# Patient Record
Sex: Female | Born: 1960 | Race: White | Hispanic: No | Marital: Married | Smoking: Current every day smoker
Health system: Southern US, Community
[De-identification: ages and names within clinical notes are randomized; demographics above are authoritative.]

## PROBLEM LIST (undated history)

## (undated) ENCOUNTER — Ambulatory Visit: Payer: Self-pay | Admitting: Gastroenterology

## (undated) DIAGNOSIS — B182 Chronic viral hepatitis C: Secondary | ICD-10-CM

## (undated) DIAGNOSIS — F1010 Alcohol abuse, uncomplicated: Secondary | ICD-10-CM

## (undated) DIAGNOSIS — N200 Calculus of kidney: Secondary | ICD-10-CM

## (undated) DIAGNOSIS — G2581 Restless legs syndrome: Secondary | ICD-10-CM

## (undated) DIAGNOSIS — K5790 Diverticulosis of intestine, part unspecified, without perforation or abscess without bleeding: Secondary | ICD-10-CM

## (undated) DIAGNOSIS — R0602 Shortness of breath: Secondary | ICD-10-CM

## (undated) DIAGNOSIS — F419 Anxiety disorder, unspecified: Secondary | ICD-10-CM

## (undated) DIAGNOSIS — N2889 Other specified disorders of kidney and ureter: Secondary | ICD-10-CM

## (undated) DIAGNOSIS — K219 Gastro-esophageal reflux disease without esophagitis: Secondary | ICD-10-CM

## (undated) DIAGNOSIS — J439 Emphysema, unspecified: Secondary | ICD-10-CM

## (undated) DIAGNOSIS — C3490 Malignant neoplasm of unspecified part of unspecified bronchus or lung: Secondary | ICD-10-CM

## (undated) DIAGNOSIS — G629 Polyneuropathy, unspecified: Secondary | ICD-10-CM

## (undated) DIAGNOSIS — M130 Polyarthritis, unspecified: Secondary | ICD-10-CM

## (undated) DIAGNOSIS — E119 Type 2 diabetes mellitus without complications: Secondary | ICD-10-CM

## (undated) DIAGNOSIS — E559 Vitamin D deficiency, unspecified: Secondary | ICD-10-CM

## (undated) DIAGNOSIS — K449 Diaphragmatic hernia without obstruction or gangrene: Secondary | ICD-10-CM

## (undated) DIAGNOSIS — G894 Chronic pain syndrome: Secondary | ICD-10-CM

## (undated) DIAGNOSIS — K7030 Alcoholic cirrhosis of liver without ascites: Secondary | ICD-10-CM

## (undated) DIAGNOSIS — M797 Fibromyalgia: Secondary | ICD-10-CM

## (undated) DIAGNOSIS — F329 Major depressive disorder, single episode, unspecified: Secondary | ICD-10-CM

## (undated) DIAGNOSIS — Z972 Presence of dental prosthetic device (complete) (partial): Secondary | ICD-10-CM

## (undated) DIAGNOSIS — K08109 Complete loss of teeth, unspecified cause, unspecified class: Secondary | ICD-10-CM

## (undated) DIAGNOSIS — Z8719 Personal history of other diseases of the digestive system: Secondary | ICD-10-CM

## (undated) DIAGNOSIS — G8929 Other chronic pain: Secondary | ICD-10-CM

## (undated) DIAGNOSIS — D500 Iron deficiency anemia secondary to blood loss (chronic): Secondary | ICD-10-CM

## (undated) DIAGNOSIS — R350 Frequency of micturition: Secondary | ICD-10-CM

## (undated) DIAGNOSIS — M5136 Other intervertebral disc degeneration, lumbar region: Secondary | ICD-10-CM

## (undated) DIAGNOSIS — M479 Spondylosis, unspecified: Secondary | ICD-10-CM

## (undated) DIAGNOSIS — M47816 Spondylosis without myelopathy or radiculopathy, lumbar region: Secondary | ICD-10-CM

## (undated) DIAGNOSIS — M545 Low back pain: Secondary | ICD-10-CM

## (undated) DIAGNOSIS — I10 Essential (primary) hypertension: Secondary | ICD-10-CM

## (undated) HISTORY — DX: Vitamin D deficiency, unspecified: E55.9

## (undated) HISTORY — DX: Restless legs syndrome: G25.81

## (undated) HISTORY — DX: Frequency of micturition: R35.0

## (undated) HISTORY — DX: Polyarthritis, unspecified: M13.0

## (undated) HISTORY — PX: TONSILLECTOMY: SUR1361

## (undated) HISTORY — DX: Low back pain: M54.5

## (undated) HISTORY — DX: Other chronic pain: G89.29

## (undated) HISTORY — DX: Essential (primary) hypertension: I10

## (undated) HISTORY — DX: Other intervertebral disc degeneration, lumbar region: M51.36

## (undated) HISTORY — DX: Anxiety disorder, unspecified: F41.9

## (undated) HISTORY — DX: Shortness of breath: R06.02

## (undated) HISTORY — DX: Chronic viral hepatitis C: B18.2

## (undated) HISTORY — DX: Fibromyalgia: M79.7

## (undated) HISTORY — DX: Alcohol abuse, uncomplicated: F10.10

## (undated) HISTORY — PX: TUBAL LIGATION: SHX77

---

## 2001-08-22 ENCOUNTER — Other Ambulatory Visit: Admission: RE | Admit: 2001-08-22 | Discharge: 2001-08-22 | Payer: Self-pay | Admitting: *Deleted

## 2001-12-28 ENCOUNTER — Encounter: Payer: Self-pay | Admitting: Internal Medicine

## 2001-12-29 ENCOUNTER — Ambulatory Visit (HOSPITAL_COMMUNITY): Admission: RE | Admit: 2001-12-29 | Discharge: 2001-12-29 | Payer: Self-pay | Admitting: Internal Medicine

## 2005-03-29 HISTORY — PX: ENDOMETRIAL ABLATION: SHX621

## 2005-06-24 ENCOUNTER — Ambulatory Visit: Payer: Self-pay | Admitting: Unknown Physician Specialty

## 2007-05-24 ENCOUNTER — Ambulatory Visit: Payer: Self-pay | Admitting: Internal Medicine

## 2007-06-06 ENCOUNTER — Ambulatory Visit: Payer: Self-pay | Admitting: Internal Medicine

## 2008-07-31 ENCOUNTER — Ambulatory Visit: Payer: Self-pay | Admitting: Unknown Physician Specialty

## 2008-08-07 ENCOUNTER — Ambulatory Visit: Payer: Self-pay | Admitting: Unknown Physician Specialty

## 2008-08-07 ENCOUNTER — Ambulatory Visit: Payer: Self-pay | Admitting: Internal Medicine

## 2009-02-05 ENCOUNTER — Ambulatory Visit: Payer: Self-pay | Admitting: Unknown Physician Specialty

## 2009-09-17 ENCOUNTER — Ambulatory Visit: Payer: Self-pay | Admitting: Unknown Physician Specialty

## 2009-11-01 ENCOUNTER — Ambulatory Visit: Payer: Self-pay | Admitting: Family Medicine

## 2009-11-01 DIAGNOSIS — J209 Acute bronchitis, unspecified: Secondary | ICD-10-CM | POA: Insufficient documentation

## 2010-04-28 NOTE — Assessment & Plan Note (Signed)
Summary: congestion with bad cough-headache/jbb   Vital Signs:  Patient Profile:   50 Years Old Female CC:      Cold & URI symptoms Height:     70 inches Weight:      167 pounds BMI:     24.05 O2 Sat:      98 % O2 treatment:    Room Air Temp:     97.6 degrees F oral Pulse rate:   77 / minute Pulse rhythm:   regular Resp:     20 per minute BP sitting:   144 / 91  (right arm)  Pt. in pain?   yes    Location:   head    Intensity:   5    Type:       aching  Vitals Entered By: Levonne Spiller EMT-P (November 01, 2009 11:21 AM)              Is Patient Diabetic? No Comments Pt is a smoker. 1 half pack per day.      Prior Medication List:  No prior medications documented  Current Allergies: No known allergies History of Present Illness Chief Complaint: Cold & URI symptoms History of Present Illness:  Patient got sick on Tuesday . Her Last day of vacation. She has had acough and head congestion.  Usually some allergies at season change but never coughing spells that make her feel thatr her breath is beiongtaken a way. No wheezing but nose is running heavily.   REVIEW OF SYSTEMS Constitutional Symptoms       Complains of fever, chills, night sweats, and fatigue.     Denies weight loss and weight gain.  Eyes       Denies change in vision, eye pain, and eye discharge. Ear/Nose/Throat/Mouth       Complains of ear pain, frequent runny nose, sinus problems, sore throat, and hoarseness.      Denies hearing loss/aids, change in hearing, ear discharge, and frequent nose bleeds.      Comments: from coughing Respiratory       Complains of dry cough and shortness of breath.      Denies productive cough, wheezing, asthma, bronchitis, and emphysema/COPD.  Cardiovascular       Complains of tires easily with exhertion.      Denies murmurs and chest pain.    Gastrointestinal       Denies stomach pain, nausea/vomiting, diarrhea, constipation, and blood in bowel movements. Genitourniary  Denies painful urination and blood or discharge from vagina. Neurological       Complains of headaches and weakness.      Denies seizures. Musculoskeletal       Complains of muscle pain.      Denies joint pain, joint stiffness, and decreased range of motion.   Psych       Complains of sleep problems.      Denies anxiety/stress and speech problems.  Past History:  Past Surgical History: uterine ablation  Family History: Family History of Stroke F 1st degree relative <60 Family History of Stroke M 1st degree relative <50 Family History Breast cancer 1st degree relative <50  Social History: Married Current Smoker Alcohol use-no Drug use-no Regular exercise-no Smoking Status:  current Drug Use:  no Does Patient Exercise:  no Physical Exam General appearance: well developed, well nourished,mild distress Head: normocephalic, atraumatic Ears: R ear w/scarring  Nasal: pale, boggy, swollen nasal turbinates Neck: neck supple,  trachea midline, no masses Chest/Lungs: no rales, wheezes,  or rhonchi bilateral, breath sounds equal without effort Skin: no obvious rashes or lesions MSE: oriented to time, place, and person Assessment New Problems: BRONCHITIS, ACUTE (ICD-466.0) ACUTE SINUSITIS, UNSPECIFIED (ICD-461.9) FAMILY HISTORY BREAST CANCER 1ST DEGREE RELATIVE <50 (ICD-V16.3)  sinusitis bronchitis  Patient Education: Patient and/or caregiver instructed in the following: rest, rest fluids and Tylenol.  Plan New Medications/Changes: Sandria Senter ER 8-10 MG/5ML LQCR (CHLORPHENIRAMINE-HYDROCODONE) 1 tsp by mouth 2x aday  #4 floz x 0, 11/01/2009, Hassan Rowan MD CEFUROXIME AXETIL 500 MG  TABS (CEFUROXIME AXETIL) 1 by mouth 2 times daily  #20 x 0, 11/01/2009, Hassan Rowan MD  New Orders: New Patient Level III [phone] Follow Up: Follow up in 2-3 days if no improvement  The patient and/or caregiver has been counseled thoroughly with regard to medications prescribed  including dosage, schedule, interactions, rationale for use, and possible side effects and they verbalize understanding.  Diagnoses and expected course of recovery discussed and will return if not improved as expected or if the condition worsens. Patient and/or caregiver verbalized understanding.  Prescriptions: Sandria Senter ER 8-10 MG/5ML LQCR (CHLORPHENIRAMINE-HYDROCODONE) 1 tsp by mouth 2x aday  #4 floz x 0   Entered and Authorized by:   Hassan Rowan MD   Signed by:   Hassan Rowan MD on 11/01/2009   Method used:   Print then Give to Patient   RxID:   [id number] CEFUROXIME AXETIL 500 MG  TABS (CEFUROXIME AXETIL) 1 by mouth 2 times daily  #20 x 0   Entered and Authorized by:   Hassan Rowan MD   Signed by:   Hassan Rowan MD on 11/01/2009   Method used:   Print then Give to Patient   RxID:   [id number]   Patient Instructions: 1)  Please schedule a follow-up appointment as needed. 2)  Please schedule an appointment with your primary doctor in 1-2 weeks if not bettter. 3)  Tobacco is very bad for your health and your loved ones! You Should stop smoking!. 4)  Stop Smoking Tips: Choose a Quit date. Cut down before the Quit date. decide what you will do as a substitute when you feel the urge to smoke(gum,toothpick,exercise). 5)  Tussinex can cause sedation be careful taking during the day 6)  Take your antibiotic as prescribed until ALL of it is gone, but stop if you develop a rash or swelling and contact our office as soon as possible. 7)  Acute sinusitis symptoms for less than 10 days are not helped by antibiotics.Use warm moist compresses, and over the counter decongestants ( only as directed). Call if no improvement in 5-7 days, sooner if increasing pain, fever, or new symptoms.  Orders Added: 1)  New Patient Level III [36644]

## 2010-11-19 ENCOUNTER — Ambulatory Visit: Payer: Self-pay | Admitting: Obstetrics & Gynecology

## 2010-12-25 ENCOUNTER — Ambulatory Visit: Payer: Self-pay | Admitting: Surgery

## 2011-02-22 ENCOUNTER — Ambulatory Visit: Payer: Self-pay | Admitting: Gastroenterology

## 2011-02-24 LAB — PATHOLOGY REPORT

## 2011-12-14 ENCOUNTER — Ambulatory Visit: Payer: Self-pay | Admitting: Internal Medicine

## 2011-12-19 HISTORY — PX: SPINAL FUSION: SHX223

## 2012-12-17 ENCOUNTER — Emergency Department: Payer: Self-pay | Admitting: Emergency Medicine

## 2012-12-17 LAB — DRUG SCREEN, URINE
Amphetamines, Ur Screen: NEGATIVE (ref ?–1000)
Barbiturates, Ur Screen: NEGATIVE (ref ?–200)
Benzodiazepine, Ur Scrn: NEGATIVE (ref ?–200)
Cannabinoid 50 Ng, Ur ~~LOC~~: NEGATIVE (ref ?–50)
Cocaine Metabolite,Ur ~~LOC~~: NEGATIVE (ref ?–300)
MDMA (Ecstasy)Ur Screen: NEGATIVE (ref ?–500)
Methadone, Ur Screen: NEGATIVE (ref ?–300)
Opiate, Ur Screen: POSITIVE (ref ?–300)
Phencyclidine (PCP) Ur S: NEGATIVE (ref ?–25)
Tricyclic, Ur Screen: NEGATIVE (ref ?–1000)

## 2012-12-17 LAB — URINALYSIS, COMPLETE
Bilirubin,UR: NEGATIVE
Blood: NEGATIVE
Glucose,UR: NEGATIVE mg/dL (ref 0–75)
Hyaline Cast: 17
Ketone: NEGATIVE
Leukocyte Esterase: NEGATIVE
Nitrite: NEGATIVE
Ph: 5 (ref 4.5–8.0)
Protein: NEGATIVE
RBC,UR: <1 /HPF (ref 0–5)
Specific Gravity: 1.005 (ref 1.003–1.030)
Squamous Epithelial: 1
WBC UR: 4 /HPF (ref 0–5)

## 2012-12-17 LAB — COMPREHENSIVE METABOLIC PANEL
Albumin: 3.9 g/dL (ref 3.4–5.0)
Alkaline Phosphatase: 107 U/L (ref 50–136)
Anion Gap: 8 (ref 7–16)
BUN: 6 mg/dL — ABNORMAL LOW (ref 7–18)
Bilirubin,Total: 0.4 mg/dL (ref 0.2–1.0)
Calcium, Total: 8.8 mg/dL (ref 8.5–10.1)
Chloride: 110 mmol/L — ABNORMAL HIGH (ref 98–107)
Co2: 22 mmol/L (ref 21–32)
Creatinine: 1.15 mg/dL (ref 0.60–1.30)
EGFR (African American): >60
EGFR (Non-African Amer.): 55 — ABNORMAL LOW
Glucose: 169 mg/dL — ABNORMAL HIGH (ref 65–99)
Osmolality: 281 (ref 275–301)
Potassium: 3.7 mmol/L (ref 3.5–5.1)
SGOT(AST): 185 U/L — ABNORMAL HIGH (ref 15–37)
SGPT (ALT): 151 U/L — ABNORMAL HIGH (ref 12–78)
Sodium: 140 mmol/L (ref 136–145)
Total Protein: 6.7 g/dL (ref 6.4–8.2)

## 2012-12-17 LAB — CBC
HCT: 39.1 % (ref 35.0–47.0)
HGB: 13.2 g/dL (ref 12.0–16.0)
MCH: 36 pg — ABNORMAL HIGH (ref 26.0–34.0)
MCHC: 33.6 g/dL (ref 32.0–36.0)
MCV: 107 fL — ABNORMAL HIGH (ref 80–100)
Platelet: 178 10*3/uL (ref 150–440)
RBC: 3.65 10*6/uL — ABNORMAL LOW (ref 3.80–5.20)
RDW: 12.9 % (ref 11.5–14.5)
WBC: 8.6 10*3/uL (ref 3.6–11.0)

## 2012-12-17 LAB — TROPONIN I: Troponin-I: <0.02 ng/mL

## 2012-12-17 LAB — SALICYLATE LEVEL: Salicylates, Serum: 2.4 mg/dL

## 2012-12-17 LAB — ETHANOL
Ethanol %: 0.28 % — ABNORMAL HIGH (ref 0.000–0.080)
Ethanol: 280 mg/dL

## 2012-12-17 LAB — CK
CK, Total: 1339 U/L — ABNORMAL HIGH (ref 21–215)
CK, Total: 1597 U/L — ABNORMAL HIGH (ref 21–215)

## 2012-12-17 LAB — TSH: Thyroid Stimulating Horm: 0.686 u[IU]/mL

## 2012-12-17 LAB — ACETAMINOPHEN LEVEL: Acetaminophen: <2 ug/mL

## 2013-09-23 ENCOUNTER — Emergency Department: Payer: Self-pay | Admitting: Emergency Medicine

## 2013-09-23 LAB — COMPREHENSIVE METABOLIC PANEL
Albumin: 2.9 g/dL — ABNORMAL LOW (ref 3.4–5.0)
Alkaline Phosphatase: 81 U/L
Anion Gap: 7 (ref 7–16)
BUN: 5 mg/dL — ABNORMAL LOW (ref 7–18)
Bilirubin,Total: 1.1 mg/dL — ABNORMAL HIGH (ref 0.2–1.0)
Calcium, Total: 8.5 mg/dL (ref 8.5–10.1)
Chloride: 97 mmol/L — ABNORMAL LOW (ref 98–107)
Co2: 30 mmol/L (ref 21–32)
Creatinine: 0.73 mg/dL (ref 0.60–1.30)
EGFR (African American): >60
EGFR (Non-African Amer.): >60
Glucose: 152 mg/dL — ABNORMAL HIGH (ref 65–99)
Osmolality: 268 (ref 275–301)
Potassium: 3.4 mmol/L — ABNORMAL LOW (ref 3.5–5.1)
SGOT(AST): 81 U/L — ABNORMAL HIGH (ref 15–37)
SGPT (ALT): 33 U/L (ref 12–78)
Sodium: 134 mmol/L — ABNORMAL LOW (ref 136–145)
Total Protein: 6.9 g/dL (ref 6.4–8.2)

## 2013-09-23 LAB — CBC WITH DIFFERENTIAL/PLATELET
Basophil #: 0 10*3/uL (ref 0.0–0.1)
Basophil %: 0.8 %
Eosinophil #: 0.1 10*3/uL (ref 0.0–0.7)
Eosinophil %: 1.6 %
HCT: 34.2 % — ABNORMAL LOW (ref 35.0–47.0)
HGB: 11.6 g/dL — ABNORMAL LOW (ref 12.0–16.0)
Lymphocyte #: 1.8 10*3/uL (ref 1.0–3.6)
Lymphocyte %: 31.2 %
MCH: 38.1 pg — ABNORMAL HIGH (ref 26.0–34.0)
MCHC: 34 g/dL (ref 32.0–36.0)
MCV: 112 fL — ABNORMAL HIGH (ref 80–100)
Monocyte #: 0.5 x10 3/mm (ref 0.2–0.9)
Monocyte %: 9.2 %
Neutrophil #: 3.4 10*3/uL (ref 1.4–6.5)
Neutrophil %: 57.2 %
Platelet: 96 10*3/uL — ABNORMAL LOW (ref 150–440)
RBC: 3.05 10*6/uL — ABNORMAL LOW (ref 3.80–5.20)
RDW: 13.4 % (ref 11.5–14.5)
WBC: 5.9 10*3/uL (ref 3.6–11.0)

## 2013-09-23 LAB — URINALYSIS, COMPLETE
Bacteria: NONE SEEN
Bilirubin,UR: NEGATIVE
Blood: NEGATIVE
Glucose,UR: NEGATIVE mg/dL (ref 0–75)
Ketone: NEGATIVE
Leukocyte Esterase: NEGATIVE
Nitrite: NEGATIVE
Ph: 8 (ref 4.5–8.0)
Protein: NEGATIVE
RBC,UR: NONE SEEN /HPF (ref 0–5)
Specific Gravity: 1.006 (ref 1.003–1.030)
Squamous Epithelial: 4
WBC UR: 2 /HPF (ref 0–5)

## 2013-09-23 LAB — PRO B NATRIURETIC PEPTIDE: B-Type Natriuretic Peptide: 929 pg/mL — ABNORMAL HIGH (ref 0–125)

## 2013-09-23 LAB — TROPONIN I: Troponin-I: <0.02 ng/mL

## 2013-10-12 ENCOUNTER — Ambulatory Visit: Payer: Self-pay | Admitting: Internal Medicine

## 2013-10-12 LAB — COMPREHENSIVE METABOLIC PANEL
Albumin: 3 g/dL — ABNORMAL LOW (ref 3.4–5.0)
Alkaline Phosphatase: 86 U/L
Anion Gap: 7 (ref 7–16)
BUN: 3 mg/dL — ABNORMAL LOW (ref 7–18)
Bilirubin,Total: 1.1 mg/dL — ABNORMAL HIGH (ref 0.2–1.0)
Calcium, Total: 8.1 mg/dL — ABNORMAL LOW (ref 8.5–10.1)
Chloride: 103 mmol/L (ref 98–107)
Co2: 30 mmol/L (ref 21–32)
Creatinine: 0.9 mg/dL (ref 0.60–1.30)
EGFR (African American): >60
EGFR (Non-African Amer.): >60
Glucose: 129 mg/dL — ABNORMAL HIGH (ref 65–99)
Osmolality: 278 (ref 275–301)
Potassium: 3.2 mmol/L — ABNORMAL LOW (ref 3.5–5.1)
SGOT(AST): 71 U/L — ABNORMAL HIGH (ref 15–37)
SGPT (ALT): 31 U/L (ref 12–78)
Sodium: 140 mmol/L (ref 136–145)
Total Protein: 7.2 g/dL (ref 6.4–8.2)

## 2013-10-12 LAB — CBC WITH DIFFERENTIAL/PLATELET
Basophil #: 0.1 10*3/uL (ref 0.0–0.1)
Basophil %: 1 %
Eosinophil #: 0.1 10*3/uL (ref 0.0–0.7)
Eosinophil %: 1.2 %
HCT: 36.2 % (ref 35.0–47.0)
HGB: 12.2 g/dL (ref 12.0–16.0)
Lymphocyte #: 1.8 10*3/uL (ref 1.0–3.6)
Lymphocyte %: 33.5 %
MCH: 37 pg — ABNORMAL HIGH (ref 26.0–34.0)
MCHC: 33.6 g/dL (ref 32.0–36.0)
MCV: 110 fL — ABNORMAL HIGH (ref 80–100)
Monocyte #: 0.5 x10 3/mm (ref 0.2–0.9)
Monocyte %: 9.5 %
Neutrophil #: 2.9 10*3/uL (ref 1.4–6.5)
Neutrophil %: 54.8 %
Platelet: 93 10*3/uL — ABNORMAL LOW (ref 150–440)
RBC: 3.29 10*6/uL — ABNORMAL LOW (ref 3.80–5.20)
RDW: 13.3 % (ref 11.5–14.5)
WBC: 5.3 10*3/uL (ref 3.6–11.0)

## 2013-10-12 LAB — LIPID PANEL
Cholesterol: 113 mg/dL (ref 0–200)
HDL Cholesterol: 7 mg/dL — ABNORMAL LOW (ref 40–60)
Ldl Cholesterol, Calc: 68 mg/dL (ref 0–100)
Triglycerides: 190 mg/dL (ref 0–200)
VLDL Cholesterol, Calc: 38 mg/dL (ref 5–40)

## 2013-10-12 LAB — T4, FREE: Free Thyroxine: 1.16 ng/dL (ref 0.76–1.46)

## 2013-10-12 LAB — IRON AND TIBC
Iron Bind.Cap.(Total): 198 ug/dL — ABNORMAL LOW (ref 250–450)
Iron Saturation: 95 %
Iron: 188 ug/dL — ABNORMAL HIGH (ref 50–170)
Unbound Iron-Bind.Cap.: 10 ug/dL

## 2013-10-12 LAB — FERRITIN: Ferritin (ARMC): 834 ng/mL — ABNORMAL HIGH (ref 8–388)

## 2013-10-12 LAB — SEDIMENTATION RATE: Erythrocyte Sed Rate: 37 mm/hr — ABNORMAL HIGH (ref 0–30)

## 2013-10-12 LAB — URIC ACID: Uric Acid: 8.1 mg/dL — ABNORMAL HIGH (ref 2.6–6.0)

## 2013-10-12 LAB — TSH: Thyroid Stimulating Horm: 1.72 u[IU]/mL

## 2013-10-12 LAB — FOLATE: Folic Acid: 7.9 ng/mL (ref 3.1–100.0)

## 2013-10-19 ENCOUNTER — Other Ambulatory Visit: Payer: Self-pay | Admitting: Internal Medicine

## 2013-10-27 ENCOUNTER — Ambulatory Visit: Payer: Self-pay | Admitting: Internal Medicine

## 2013-11-21 ENCOUNTER — Other Ambulatory Visit: Payer: Self-pay | Admitting: Orthopaedic Surgery

## 2013-11-21 DIAGNOSIS — IMO0002 Reserved for concepts with insufficient information to code with codable children: Secondary | ICD-10-CM

## 2013-11-21 DIAGNOSIS — M545 Low back pain: Secondary | ICD-10-CM

## 2013-11-22 LAB — BASIC METABOLIC PANEL
Anion Gap: 11 (ref 7–16)
BUN: 4 mg/dL — ABNORMAL LOW (ref 7–18)
Calcium, Total: 8.2 mg/dL — ABNORMAL LOW (ref 8.5–10.1)
Chloride: 100 mmol/L (ref 98–107)
Co2: 30 mmol/L (ref 21–32)
Creatinine: 0.92 mg/dL (ref 0.60–1.30)
EGFR (African American): >60
EGFR (Non-African Amer.): >60
Glucose: 104 mg/dL — ABNORMAL HIGH (ref 65–99)
Osmolality: 278 (ref 275–301)
Potassium: 2.9 mmol/L — ABNORMAL LOW (ref 3.5–5.1)
Sodium: 141 mmol/L (ref 136–145)

## 2013-11-22 LAB — CBC WITH DIFFERENTIAL/PLATELET
Basophil #: 0.1 10*3/uL (ref 0.0–0.1)
Basophil #: 0 10*3/uL (ref 0.0–0.1)
Basophil %: 0.6 %
Basophil %: 0.5 %
Eosinophil #: 0.2 10*3/uL (ref 0.0–0.7)
Eosinophil #: 0.2 10*3/uL (ref 0.0–0.7)
Eosinophil %: 2.1 %
Eosinophil %: 2.4 %
HCT: 38 % (ref 35.0–47.0)
HCT: 37 % (ref 35.0–47.0)
HGB: 12.4 g/dL (ref 12.0–16.0)
HGB: 12.2 g/dL (ref 12.0–16.0)
Lymphocyte #: 4.4 10*3/uL — ABNORMAL HIGH (ref 1.0–3.6)
Lymphocyte #: 3.5 10*3/uL (ref 1.0–3.6)
Lymphocyte %: 50.3 %
Lymphocyte %: 51 %
MCH: 36 pg — ABNORMAL HIGH (ref 26.0–34.0)
MCH: 36.3 pg — ABNORMAL HIGH (ref 26.0–34.0)
MCHC: 32.5 g/dL (ref 32.0–36.0)
MCHC: 32.9 g/dL (ref 32.0–36.0)
MCV: 111 fL — ABNORMAL HIGH (ref 80–100)
MCV: 111 fL — ABNORMAL HIGH (ref 80–100)
Monocyte #: 0.7 x10 3/mm (ref 0.2–0.9)
Monocyte #: 0.6 x10 3/mm (ref 0.2–0.9)
Monocyte %: 7.8 %
Monocyte %: 8.8 %
Neutrophil #: 3.4 10*3/uL (ref 1.4–6.5)
Neutrophil #: 2.6 10*3/uL (ref 1.4–6.5)
Neutrophil %: 39.2 %
Neutrophil %: 37.3 %
Platelet: 106 10*3/uL — ABNORMAL LOW (ref 150–440)
Platelet: 93 10*3/uL — ABNORMAL LOW (ref 150–440)
RBC: 3.43 10*6/uL — ABNORMAL LOW (ref 3.80–5.20)
RBC: 3.35 10*6/uL — ABNORMAL LOW (ref 3.80–5.20)
RDW: 14.7 % — ABNORMAL HIGH (ref 11.5–14.5)
RDW: 14.8 % — ABNORMAL HIGH (ref 11.5–14.5)
WBC: 8.7 10*3/uL (ref 3.6–11.0)
WBC: 7 10*3/uL (ref 3.6–11.0)

## 2013-11-22 LAB — HEPATIC FUNCTION PANEL A (ARMC)
Albumin: 3.3 g/dL — ABNORMAL LOW (ref 3.4–5.0)
Alkaline Phosphatase: 99 U/L
Bilirubin, Direct: 1.2 mg/dL — ABNORMAL HIGH (ref 0.00–0.20)
Bilirubin,Total: 2.3 mg/dL — ABNORMAL HIGH (ref 0.2–1.0)
SGOT(AST): 96 U/L — ABNORMAL HIGH (ref 15–37)
SGPT (ALT): 50 U/L
Total Protein: 7.9 g/dL (ref 6.4–8.2)

## 2013-11-22 LAB — ETHANOL: Ethanol: <3 mg/dL

## 2013-11-22 LAB — URINALYSIS, COMPLETE
Bacteria: NONE SEEN
Glucose,UR: NEGATIVE mg/dL (ref 0–75)
Hyaline Cast: 9
Ketone: NEGATIVE
Leukocyte Esterase: NEGATIVE
Nitrite: NEGATIVE
Ph: 5 (ref 4.5–8.0)
Protein: 100
RBC,UR: NONE SEEN /HPF (ref 0–5)
Specific Gravity: 1.028 (ref 1.003–1.030)
Squamous Epithelial: 35
WBC UR: 6 /HPF (ref 0–5)

## 2013-11-22 LAB — PROTIME-INR
INR: 1.6
Prothrombin Time: 18.4 secs — ABNORMAL HIGH (ref 11.5–14.7)

## 2013-11-23 ENCOUNTER — Inpatient Hospital Stay: Payer: Self-pay | Admitting: Internal Medicine

## 2013-11-23 LAB — CBC WITH DIFFERENTIAL/PLATELET
Basophil #: 0 10*3/uL (ref 0.0–0.1)
Basophil %: 0.6 %
Eosinophil #: 0.2 10*3/uL (ref 0.0–0.7)
Eosinophil %: 6.1 %
HCT: 28.3 % — ABNORMAL LOW (ref 35.0–47.0)
HGB: 9.6 g/dL — ABNORMAL LOW (ref 12.0–16.0)
Lymphocyte #: 2.1 10*3/uL (ref 1.0–3.6)
Lymphocyte %: 58.5 %
MCH: 37.2 pg — ABNORMAL HIGH (ref 26.0–34.0)
MCHC: 33.9 g/dL (ref 32.0–36.0)
MCV: 110 fL — ABNORMAL HIGH (ref 80–100)
Monocyte #: 0.3 x10 3/mm (ref 0.2–0.9)
Monocyte %: 9.3 %
Neutrophil #: 0.9 10*3/uL — ABNORMAL LOW (ref 1.4–6.5)
Neutrophil %: 25.5 %
Platelet: 66 10*3/uL — ABNORMAL LOW (ref 150–440)
RBC: 2.58 10*6/uL — ABNORMAL LOW (ref 3.80–5.20)
RDW: 14.5 % (ref 11.5–14.5)
WBC: 3.6 10*3/uL (ref 3.6–11.0)

## 2013-11-23 LAB — BASIC METABOLIC PANEL
Anion Gap: 6 — ABNORMAL LOW (ref 7–16)
BUN: 5 mg/dL — ABNORMAL LOW (ref 7–18)
Calcium, Total: 7.1 mg/dL — ABNORMAL LOW (ref 8.5–10.1)
Chloride: 102 mmol/L (ref 98–107)
Co2: 30 mmol/L (ref 21–32)
Creatinine: 0.83 mg/dL (ref 0.60–1.30)
EGFR (African American): >60
EGFR (Non-African Amer.): >60
Glucose: 139 mg/dL — ABNORMAL HIGH (ref 65–99)
Osmolality: 275 (ref 275–301)
Potassium: 2.7 mmol/L — ABNORMAL LOW (ref 3.5–5.1)
Sodium: 138 mmol/L (ref 136–145)

## 2013-11-23 LAB — PHOSPHORUS: Phosphorus: 3.6 mg/dL (ref 2.5–4.9)

## 2013-11-23 LAB — MAGNESIUM: Magnesium: 0.8 mg/dL — ABNORMAL LOW

## 2013-11-24 LAB — CBC WITH DIFFERENTIAL/PLATELET
Basophil #: 0 10*3/uL (ref 0.0–0.1)
Basophil %: 0.8 %
Eosinophil #: 0.1 10*3/uL (ref 0.0–0.7)
Eosinophil %: 4 %
HCT: 30.5 % — ABNORMAL LOW (ref 35.0–47.0)
HGB: 9.9 g/dL — ABNORMAL LOW (ref 12.0–16.0)
Lymphocyte #: 1.4 10*3/uL (ref 1.0–3.6)
Lymphocyte %: 43 %
MCH: 36.6 pg — ABNORMAL HIGH (ref 26.0–34.0)
MCHC: 32.4 g/dL (ref 32.0–36.0)
MCV: 113 fL — ABNORMAL HIGH (ref 80–100)
Monocyte #: 0.4 x10 3/mm (ref 0.2–0.9)
Monocyte %: 10.9 %
Neutrophil #: 1.3 10*3/uL — ABNORMAL LOW (ref 1.4–6.5)
Neutrophil %: 41.3 %
Platelet: 67 10*3/uL — ABNORMAL LOW (ref 150–440)
RBC: 2.7 10*6/uL — ABNORMAL LOW (ref 3.80–5.20)
RDW: 14.4 % (ref 11.5–14.5)
WBC: 3.2 10*3/uL — ABNORMAL LOW (ref 3.6–11.0)

## 2013-11-24 LAB — URINE CULTURE

## 2013-11-24 LAB — BASIC METABOLIC PANEL
Anion Gap: 8 (ref 7–16)
BUN: 3 mg/dL — ABNORMAL LOW (ref 7–18)
Calcium, Total: 7.1 mg/dL — ABNORMAL LOW (ref 8.5–10.1)
Chloride: 110 mmol/L — ABNORMAL HIGH (ref 98–107)
Co2: 23 mmol/L (ref 21–32)
Creatinine: 0.77 mg/dL (ref 0.60–1.30)
EGFR (African American): >60
EGFR (Non-African Amer.): >60
Glucose: 172 mg/dL — ABNORMAL HIGH (ref 65–99)
Osmolality: 282 (ref 275–301)
Potassium: 4.2 mmol/L (ref 3.5–5.1)
Sodium: 141 mmol/L (ref 136–145)

## 2013-11-24 LAB — MAGNESIUM: Magnesium: 1.3 mg/dL — ABNORMAL LOW

## 2013-11-25 LAB — MAGNESIUM: Magnesium: 1.5 mg/dL — ABNORMAL LOW

## 2013-11-26 LAB — CBC WITH DIFFERENTIAL/PLATELET
Basophil #: 0 10*3/uL (ref 0.0–0.1)
Basophil %: 1.1 %
Eosinophil #: 0.1 10*3/uL (ref 0.0–0.7)
Eosinophil %: 3.4 %
HCT: 31.6 % — ABNORMAL LOW (ref 35.0–47.0)
HGB: 10.3 g/dL — ABNORMAL LOW (ref 12.0–16.0)
Lymphocyte #: 1.8 10*3/uL (ref 1.0–3.6)
Lymphocyte %: 43.2 %
MCH: 36.7 pg — ABNORMAL HIGH (ref 26.0–34.0)
MCHC: 32.6 g/dL (ref 32.0–36.0)
MCV: 113 fL — ABNORMAL HIGH (ref 80–100)
Monocyte #: 0.5 x10 3/mm (ref 0.2–0.9)
Monocyte %: 11.5 %
Neutrophil #: 1.7 10*3/uL (ref 1.4–6.5)
Neutrophil %: 40.8 %
Platelet: 67 10*3/uL — ABNORMAL LOW (ref 150–440)
RBC: 2.81 10*6/uL — ABNORMAL LOW (ref 3.80–5.20)
RDW: 14.9 % — ABNORMAL HIGH (ref 11.5–14.5)
WBC: 4.1 10*3/uL (ref 3.6–11.0)

## 2013-11-26 LAB — BASIC METABOLIC PANEL
Anion Gap: 9 (ref 7–16)
BUN: 2 mg/dL — ABNORMAL LOW (ref 7–18)
Calcium, Total: 8.3 mg/dL — ABNORMAL LOW (ref 8.5–10.1)
Chloride: 109 mmol/L — ABNORMAL HIGH (ref 98–107)
Co2: 22 mmol/L (ref 21–32)
Creatinine: 0.7 mg/dL (ref 0.60–1.30)
EGFR (African American): >60
EGFR (Non-African Amer.): >60
Glucose: 157 mg/dL — ABNORMAL HIGH (ref 65–99)
Osmolality: 279 (ref 275–301)
Potassium: 4.9 mmol/L (ref 3.5–5.1)
Sodium: 140 mmol/L (ref 136–145)

## 2013-11-26 LAB — PROTIME-INR
INR: 1.5
Prothrombin Time: 17.8 secs — ABNORMAL HIGH (ref 11.5–14.7)

## 2013-11-26 LAB — MAGNESIUM: Magnesium: 2.2 mg/dL

## 2013-11-27 ENCOUNTER — Ambulatory Visit: Payer: Self-pay | Admitting: Internal Medicine

## 2013-11-28 ENCOUNTER — Other Ambulatory Visit: Payer: Self-pay

## 2013-11-29 ENCOUNTER — Ambulatory Visit
Admission: RE | Admit: 2013-11-29 | Discharge: 2013-11-29 | Disposition: A | Discharge status: Home / Self Care | Payer: 59 | Source: Ambulatory Visit | Attending: Orthopaedic Surgery | Admitting: Orthopaedic Surgery

## 2013-11-29 VITALS — BP 92/52 | HR 43

## 2013-11-29 DIAGNOSIS — M545 Low back pain: Secondary | ICD-10-CM

## 2013-11-29 DIAGNOSIS — IMO0002 Reserved for concepts with insufficient information to code with codable children: Secondary | ICD-10-CM

## 2013-11-29 MED ORDER — DIAZEPAM 5 MG PO TABS
10.0000 mg | ORAL_TABLET | Freq: Once | ORAL | Status: AC
  Administered 2013-11-29: 10 mg via ORAL

## 2013-11-29 MED ORDER — IOHEXOL 300 MG/ML  SOLN
10.0000 mL | Freq: Once | INTRAMUSCULAR | Status: AC | PRN
  Administered 2013-11-29: 10 mL via INTRATHECAL

## 2013-11-29 MED ORDER — MEPERIDINE HCL 100 MG/ML IJ SOLN
50.0000 mg | Freq: Once | INTRAMUSCULAR | Status: AC
  Administered 2013-11-29: 50 mg via INTRAMUSCULAR

## 2013-11-29 MED ORDER — ONDANSETRON HCL 4 MG/2ML IJ SOLN
4.0000 mg | Freq: Once | INTRAMUSCULAR | Status: AC
  Administered 2013-11-29: 4 mg via INTRAMUSCULAR

## 2013-11-29 NOTE — Discharge Instructions (Signed)

## 2014-01-06 ENCOUNTER — Emergency Department: Payer: Self-pay | Admitting: Emergency Medicine

## 2014-01-06 LAB — URINALYSIS, COMPLETE
Bilirubin,UR: NEGATIVE
Blood: NEGATIVE
Glucose,UR: NEGATIVE mg/dL (ref 0–75)
Leukocyte Esterase: NEGATIVE
Nitrite: NEGATIVE
Ph: 7 (ref 4.5–8.0)
Protein: NEGATIVE
RBC,UR: 1 /HPF (ref 0–5)
Specific Gravity: 1.015 (ref 1.003–1.030)
Squamous Epithelial: 15
WBC UR: 2 /HPF (ref 0–5)

## 2014-01-06 LAB — CBC WITH DIFFERENTIAL/PLATELET
Basophil #: 0 10*3/uL (ref 0.0–0.1)
Basophil %: 0.3 %
Eosinophil #: 0.1 10*3/uL (ref 0.0–0.7)
Eosinophil %: 1 %
HCT: 39.6 % (ref 35.0–47.0)
HGB: 13.2 g/dL (ref 12.0–16.0)
Lymphocyte #: 2.1 10*3/uL (ref 1.0–3.6)
Lymphocyte %: 24.3 %
MCH: 35.4 pg — ABNORMAL HIGH (ref 26.0–34.0)
MCHC: 33.4 g/dL (ref 32.0–36.0)
MCV: 106 fL — ABNORMAL HIGH (ref 80–100)
Monocyte #: 0.9 x10 3/mm (ref 0.2–0.9)
Monocyte %: 11.1 %
Neutrophil #: 5.4 10*3/uL (ref 1.4–6.5)
Neutrophil %: 63.3 %
Platelet: 113 10*3/uL — ABNORMAL LOW (ref 150–440)
RBC: 3.74 10*6/uL — ABNORMAL LOW (ref 3.80–5.20)
RDW: 16.2 % — ABNORMAL HIGH (ref 11.5–14.5)
WBC: 8.5 10*3/uL (ref 3.6–11.0)

## 2014-01-06 LAB — COMPREHENSIVE METABOLIC PANEL
Albumin: 3.2 g/dL — ABNORMAL LOW (ref 3.4–5.0)
Alkaline Phosphatase: 95 U/L
Anion Gap: 10 (ref 7–16)
BUN: 5 mg/dL — ABNORMAL LOW (ref 7–18)
Bilirubin,Total: 2.1 mg/dL — ABNORMAL HIGH (ref 0.2–1.0)
Calcium, Total: 8.3 mg/dL — ABNORMAL LOW (ref 8.5–10.1)
Chloride: 103 mmol/L (ref 98–107)
Co2: 25 mmol/L (ref 21–32)
Creatinine: 0.66 mg/dL (ref 0.60–1.30)
EGFR (African American): >60
EGFR (Non-African Amer.): >60
Glucose: 144 mg/dL — ABNORMAL HIGH (ref 65–99)
Osmolality: 275 (ref 275–301)
Potassium: 3.6 mmol/L (ref 3.5–5.1)
SGOT(AST): 70 U/L — ABNORMAL HIGH (ref 15–37)
SGPT (ALT): 43 U/L
Sodium: 138 mmol/L (ref 136–145)
Total Protein: 8 g/dL (ref 6.4–8.2)

## 2014-01-06 LAB — LIPASE, BLOOD: Lipase: 176 U/L (ref 73–393)

## 2014-01-06 LAB — TROPONIN I: Troponin-I: 0.03 ng/mL

## 2014-07-20 NOTE — Consult Note (Signed)
Chief Complaint:  Subjective/Chief Complaint Please see egd report.  Noted is bile in the stomach as well as food material from last night dinner.  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  Continue daily po protonix, add reglan 5 mg ac and at bedtime.  will need to have fu appt with GI in 2 weeks.  Again counselled ETOH cessation.   VITAL SIGNS/ANCILLARY NOTES: **Vital Signs.:   31-Aug-15 04:08  Vital Signs Type Routine  Temperature Temperature (F) 98.2  Temperature Source oral  Pulse Pulse 47  Respirations Respirations 20  Systolic BP Systolic BP 92  Diastolic BP (mmHg) Diastolic BP (mmHg) 51  Mean BP 64  Pulse Ox % Pulse Ox % 92  Pulse Ox Activity Level  At rest  Oxygen Delivery Room Air/ 21 %   Electronic Signatures: Loistine Simas (MD)  (Signed 31-Aug-15 12:07)  Authored: Chief Complaint, VITAL SIGNS/ANCILLARY NOTES   Last Updated: 31-Aug-15 12:07 by Loistine Simas (MD)

## 2014-07-20 NOTE — Consult Note (Signed)
Chief Complaint:  Subjective/Chief Complaint Covering for Dr. Gustavo Lah. Nausea better as long as she takes nausea meds. Tolerating liquid diet. INR 1.6. K normal range today.   VITAL SIGNS/ANCILLARY NOTES: **Vital Signs.:   29-Aug-15 08:00  Vital Signs Type Q 4hr  Temperature Temperature (F) 97.9  Celsius 36.6  Temperature Source oral  Pulse Pulse 55  Respirations Respirations 20  Systolic BP Systolic BP 99  Diastolic BP (mmHg) Diastolic BP (mmHg) 65  Mean BP 76  Pulse Ox % Pulse Ox % 95  Pulse Ox Activity Level  At rest  Oxygen Delivery Room Air/ 21 %   Brief Assessment:  GEN no acute distress   Cardiac Regular   Respiratory clear BS   Gastrointestinal positive for ascites. nontender   Lab Results: Routine Chem:  29-Aug-15 06:12   Glucose, Serum  172  BUN  3  Creatinine (comp) 0.77  Sodium, Serum 141  Potassium, Serum 4.2  Chloride, Serum  110  CO2, Serum 23  Calcium (Total), Serum  7.1  Anion Gap 8  Osmolality (calc) 282  eGFR (African American) >60  eGFR (Non-African American) >60 (eGFR values <41mL/min/1.73 m2 may be an indication of chronic kidney disease (CKD). Calculated eGFR is useful in patients with stable renal function. The eGFR calculation will not be reliable in acutely ill patients when serum creatinine is changing rapidly. It is not useful in  patients on dialysis. The eGFR calculation may not be applicable to patients at the low and high extremes of body sizes, pregnant women, and vegetarians.)  Magnesium, Serum  1.3 (1.8-2.4 THERAPEUTIC RANGE: 4-7 mg/dL TOXIC: > 10 mg/dL  -----------------------)  Routine Hem:  29-Aug-15 06:12   WBC (CBC)  3.2  RBC (CBC)  2.70  Hemoglobin (CBC)  9.9  Hematocrit (CBC)  30.5  Platelet Count (CBC)  67  MCV  113  MCH  36.6  MCHC 32.4  RDW 14.4  Neutrophil % 41.3  Lymphocyte % 43.0  Monocyte % 10.9  Eosinophil % 4.0  Basophil % 0.8  Neutrophil #  1.3  Lymphocyte # 1.4  Monocyte # 0.4  Eosinophil  # 0.1  Basophil # 0.0 (Result(s) reported on 24 Nov 2013 at 07:12AM.)   Assessment/Plan:  Assessment/Plan:  Assessment Intractable nausea/vomiting. Improving. Ascites/periph edema.   Plan Plan for EGD on Monday with Dr. Gustavo Lah. Add lasix/aldactone as per Dr. Marton Redwood recommendations as long as BP stable. Can gradually advance diet as tolerated. Thanks.   Electronic Signatures: Verdie Shire (MD)  (Signed 29-Aug-15 09:31)  Authored: Chief Complaint, VITAL SIGNS/ANCILLARY NOTES, Brief Assessment, Lab Results, Assessment/Plan   Last Updated: 29-Aug-15 09:31 by Verdie Shire (MD)

## 2014-07-20 NOTE — Consult Note (Signed)
PATIENT NAME:  Audrey Garrett, KINGTON MR#:  [id number] DATE OF BIRTH:  07/11/1960  DATE OF CONSULTATION:  11/23/2013  REFERRING PHYSICIAN:   CONSULTING PHYSICIAN:  Gonzella Lex, MD  IDENTIFYING INFORMATION AND CHIEF COMPLAINT: This is a 54 year old woman currently in the hospital for evaluation of chronic nausea and vomiting and liver disease, as well as her chronic pain. Consultation for concern about depression and suicidality.   HISTORY OF PRESENT ILLNESS: Information obtained from the patient and the chart.   CHIEF COMPLAINT: "It's my back."   This is a 54 year old woman with chronic back pain, who also has a history of liver disease thought to be related to hepatitis C. She is admitted to the hospital for treatment of chronic nausea and vomiting. Evidently, she made a comment to one of the hospitalists that implied possible suicidality triggering the psychiatry consult. The patient tells me that her chief complaint is her back pain. She has been in chronic pain for 2 years now since she had back surgery. She says it is constant and nothing has made it better, including multiple trials of narcotics. She feels miserable all the time. More recently, she has started to have the nausea and vomiting and difficulty eating. She says that she feels like her mood has generally been down and bad most all the time for a couple of years. She admits that there have been moments that she has been able to enjoy partially, such as spending time with her family, but that she does not have very much in her life that is positive. Her day-to-day home life sounds fairly empty and she cannot name anything in her routine life that she actually enjoys or looks forward to. Her sleep is poor, although she blames a lot of this on her back pain. Recently, her appetite has been poor, but she blames a lot of that on the nausea. She feels her personal relationships are poor. Her relationship with her husband is unsatisfactory. She feels  that he is completely insensitive to her suffering. The two of them do not sleep together and she feels estranged and left out of his life. She does have what sounds like an okay relationship with her adult children. She is estranged from many of her friends and has an impaired social life. She has been taking antidepressants prescribed by her pain doctor. The notes suggest that he believes that the Lexapro 20 mg has been a regular part of her regimen for months now. The patient tells me very clearly that she stopped taking the antidepressant at least 3 months ago because she thought that it was not helping and has not taken anything else for depression since then. The patient denies that she has any thoughts, intention, or plan of harming herself. She admits that she made a comment to Dr. Manuella Ghazi that she would rather die than continue to live like this, but says that that did not at all imply that she was thinking of trying to kill herself. She is not reporting any active psychotic symptoms.   PAST PSYCHIATRIC HISTORY: No previous hospitalization. Denies history of suicide attempts. Denies history of violence. She says that her regular outpatient doctor has tried her on "so many" antidepressants and that none of them have worked. Unfortunately, she cannot remember the names of any of them. We do have some old records on the chart, but they go back only until March and all they mention is the 20 mg of Lexapro. Therefore, I do not  know what if any other antidepressants may have actually been tried or whether they were effective. She does not report any other psychiatric treatment in the past.   SUBSTANCE ABUSE HISTORY: She says that she used to be a heavy drinker, but has not been a heavy drinker in several years. Up until a couple of weeks ago, she continued to consume 2 to 3 beers a day. For the last 2 weeks, she has consumed no alcohol at all because of her nausea. She says she his thinking now about stopping  entirely. She has never been involved in any substance abuse treatment in the past. Denies any other ongoing substance abuse problems or past drug problems.   FAMILY HISTORY: Positive for several sisters and a mother who had depression.   SOCIAL HISTORY: As noted above she finds her relationships unsatisfactory. She is married. This is not her first marriage. She has 2 adult children and several grandchildren. Her day-to-day life sounds pretty empty.   PAST MEDICAL HISTORY: The patient appears to have hepatitis C. She has chronic back pain. It has been treated as an outpatient with standing doses of narcotics. She has been seeing a doctor who has been monitoring this closely and appropriately with a narcotic pain contract.   CURRENT MEDICATIONS: The admitting medicines were listed as just being clonazepam 1 mg at night, Lexapro 20 mg per day, omeprazole 40 mg a day, thiamine 100 mg a day, Zanaflex 4 mg b.i.d. It looks like she was also taking a standing dose of oxycodone 20 mg several times a day as well.   ALLERGIES: No known drug allergies.   REVIEW OF SYSTEMS: As noted, she is mostly complaining of chronic low back pain. Difficulty walking, fatigue, depressed mood, tearfulness, hopelessness. Denies active suicidal ideation. Denies psychotic symptoms. Has nausea and vomiting, poor appetite, poor sleep.   MENTAL STATUS EXAMINATION: Chronically ill-appearing woman, looks her stated age. Cooperative with the interview. Psychomotor activity extremely limited and slow. Speech was extremely slow, often required several minutes to answer a question. Did not, however, appear to have actual thought blocking, just to be very psychomotor slowed. Quiet speech but understandable, not slurred. Affect down, dysphoric and flat, tearful at times. Mood stated as being bad. Thoughts appear to be lucid with no obvious delusions, although often concrete and sometimes she loses her train of thought. Denies hallucinations.  Says that she has had thoughts about wishing she would just give up but has no plan or intention to kill herself. No homicidal ideation. The patient was slightly disoriented, giving the year as 2015. She knew that she was in the hospital. She was able to remember only 1/3 objects at 3 minutes. Seems to have a baseline reasonable fund of knowledge, but seems to lose her train of thought frequently. She seemed to get a little bit confused about the purpose and nature of our interview.   LABORATORY RESULTS: Potassium remains low most recently 2.7, calcium low at 7.1, magnesium low at 0.8. Admission chemistries showed bilirubin elevated at 2.3, direct bilirubin elevated at 1.1. Alcohol level negative. CBC shows low platelet count 106.   VITAL SIGNS: Most recent blood pressure is 95/61, pulse 51, respirations 20, temperature 97.8. The patient looks chronically ill and has a lot of difficulty standing and ambulating.   ASSESSMENT: A 54 year old woman with chronic pain and liver disease, appears to have major depression. Despite the obvious stress of her chronic pain, she meets criteria for a full major depression. Nevertheless, I  do not think she is an acute risk for suicide, certainly not in the hospital. I do think that it would be a good idea to try and treat her depression more aggressively. The patient admits that she has been noncompliant with antidepressant treatment and seems to place little value on the idea of treating her depression.   TREATMENT PLAN: First of all, I will discontinue the sitter and the suicide precautions I think can be safely discontinued. To the patient this was the most important thing I was going to do for her and seemed something of a relief to her. I discussed possible medication options for depression. Her chronic pain would normally be an indication for using a medicine such as Cymbalta, but Cymbalta is contraindicated with chronic liver disease. I notice also that she is taking  ondansetron and I expect there is a good chance she will continue to need to use that medicine. Ondansetron can interact with serotonin reuptake inhibitors lowering the effectiveness of both medications potentially. I do not have any idea what old medicines she has been on in the past. Given all this, I will discontinue the Lexapro that was ordered on admission since it sounds like she was not compliant with it and never thought it was helpful. Instead, I am going to start her on Wellbutrin 150 mg once a day. This is not a serotonergic medicine so it should not interact with the ondansetron. It should be relatively safe. Hopefully could increase her energy level. I think she needs followup for her depression. I suggested to her that I will put in a social work consult for arrangements for followup mental health care. To my surprise, the patient actually took exception to this, saying that she did not want to have to see another doctor or provider outside the hospital. As I mentioned, she seems to put little value on actually treating her depression. I think that would be unwise of her, so I am going to go ahead and put in the social work consult since I think she really does need treatment.   The patient was pretty clear in telling me that she has not had a drink of alcohol in 2 weeks. I do not see a reason why she would be likely to be untruthful about that. Given that, I think the CIWA protocol is unnecessary. She is far out of the window of where she would be having symptoms of alcohol withdrawal likely, and it sounds like the amount she was using was pretty small recently anyway. Nevertheless, you can leave the orders in place. I doubt that you will need them. Her blood pressure and pulse are low already. I do not think that any of the mental changes she is having right now represent delirium tremens or anything about her alcohol use.   I will follow up after the weekend if she is still here. If further  followup is requested over the weekend, please consult Dr. Franchot Mimes. I will try to pass this along to her but cannot guarantee that she will follow up without being notified.   DIAGNOSIS, PRINCIPAL AND PRIMARY:  AXIS I: Major depression, severe, recurrent.   SECONDARY DIAGNOSES: AXIS I: Alcohol abuse, in partial remission.  AXIS III: Chronic pain, hepatitis C.    ____________________________ Gonzella Lex, MD jtc:at D: 11/23/2013 14:12:22 ET T: 11/23/2013 14:26:37 ET JOB#: [id number]  cc: Gonzella Lex, MD, <Dictator> Gonzella Lex MD ELECTRONICALLY SIGNED 12/07/2013 16:59

## 2014-07-20 NOTE — Consult Note (Signed)
Brief Consult Note: Diagnosis: major depression.   Patient was seen by consultant.   Consult note dictated.   Recommend further assessment or treatment.   Orders entered.   Comments: Psychiatry: Patient seen. Full note dictated. Can dc the sitter and the suicide precautions. DCed the lexapro as she says she was noncomplient with it anyway. Start wellbutrin xl 150qday. REquest referral for outpt psych treatment. Will follow.  Electronic Signatures: Gonzella Lex (MD)  (Signed 28-Aug-15 14:14)  Authored: Brief Consult Note   Last Updated: 28-Aug-15 14:14 by Gonzella Lex (MD)

## 2014-07-20 NOTE — Consult Note (Signed)
Chief Complaint:  Subjective/Chief Complaint seen for cirrhosis, intractible nausea and vomiting.  Patietn doing better, no n/v, tolerated some solids yesterday, npo today for egd.  mild generalized abdominal discomfort.   VITAL SIGNS/ANCILLARY NOTES: **Vital Signs.:   31-Aug-15 04:08  Vital Signs Type Routine  Temperature Temperature (F) 98.2  Temperature Source oral  Pulse Pulse 47  Respirations Respirations 20  Systolic BP Systolic BP 92  Diastolic BP (mmHg) Diastolic BP (mmHg) 51  Mean BP 64  Pulse Ox % Pulse Ox % 92  Pulse Ox Activity Level  At rest  Oxygen Delivery Room Air/ 21 %   Brief Assessment:  Cardiac Regular   Respiratory clear BS   Gastrointestinal details normal Soft  Nondistended  No masses palpable  Bowel sounds normal  No rebound tenderness  mild epigastric and generalized tenderness to palpation.   Lab Results: Routine Chem:  31-Aug-15 04:57   Magnesium, Serum 2.2 (1.8-2.4 THERAPEUTIC RANGE: 4-7 mg/dL TOXIC: > 10 mg/dL  -----------------------)  Glucose, Serum  157  BUN  2  Creatinine (comp) 0.70  Sodium, Serum 140  Potassium, Serum 4.9  Chloride, Serum  109  CO2, Serum 22  Calcium (Total), Serum  8.3  Anion Gap 9  Osmolality (calc) 279  eGFR (African American) >60  eGFR (Non-African American) >60 (eGFR values <7m/min/1.73 m2 may be an indication of chronic kidney disease (CKD). Calculated eGFR is useful in patients with stable renal function. The eGFR calculation will not be reliable in acutely ill patients when serum creatinine is changing rapidly. It is not useful in  patients on dialysis. The eGFR calculation may not be applicable to patients at the low and high extremes of body sizes, pregnant women, and vegetarians.)  Routine Coag:  31-Aug-15 04:57   Prothrombin  17.8  INR 1.5 (INR reference interval applies to patients on anticoagulant therapy. A single INR therapeutic range for coumarins is not optimal for all indications;  however, the suggested range for most indications is 2.0 - 3.0. Exceptions to the INR Reference Range may include: Prosthetic heart valves, acute myocardial infarction, prevention of myocardial infarction, and combinations of aspirin and anticoagulant. The need for a higher or lower target INR must be assessed individually. Reference: The Pharmacology and Management of the Vitamin K  antagonists: the seventh ACCP Conference on Antithrombotic and Thrombolytic Therapy. CWYOVZ.8588Sept:126 (3suppl): [id number] A HCT value >55% may artifactually increase the PT.  In one study,  the increase was an average of 25%. Reference:  "Effect on Routine and Special Coagulation Testing Values of Citrate Anticoagulant Adjustment in Patients with High HCT Values." American Journal of Clinical Pathology 2006;126:400-405.)  Routine Hem:  31-Aug-15 04:57   WBC (CBC) 4.1  RBC (CBC)  2.81  Hemoglobin (CBC)  10.3  Hematocrit (CBC)  31.6  Platelet Count (CBC)  67  MCV  113  MCH  36.7  MCHC 32.6  RDW  14.9  Neutrophil % 40.8  Lymphocyte % 43.2  Monocyte % 11.5  Eosinophil % 3.4  Basophil % 1.1  Neutrophil # 1.7  Lymphocyte # 1.8  Monocyte # 0.5  Eosinophil # 0.1  Basophil # 0.0 (Result(s) reported on 26 Nov 2013 at 06:10AM.)   Assessment/Plan:  Assessment/Plan:  Assessment 1) nausea and vomiting-improved/resolved.  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 3) epigastric pain.   Plan 1) egd today.  I have discussed the risks benefits and complications of egd to include not limited to bleeding infection perforation and sedation and she  wishes to proceed.   2) on low dose lasix and aldactone.  further recs to follow.   Electronic Signatures: Loistine Simas (MD)  (Signed 31-Aug-15 10:39)  Authored: Chief Complaint, VITAL SIGNS/ANCILLARY NOTES, Brief Assessment, Lab Results, Assessment/Plan   Last Updated: 31-Aug-15 10:39 by Loistine Simas (MD)

## 2014-07-20 NOTE — H&P (Signed)
PATIENT NAME:  Audrey Garrett, Audrey Garrett MR#:  [id number] DATE OF BIRTH:  10/02/1960  DATE OF ADMISSION:  11/22/2013   PRIMARY CARE PHYSICIAN: None.   REFERRING PHYSICIAN: Lollie Sails, MD  CHIEF COMPLAINT: Nausea and vomiting.   HISTORY OF PRESENT ILLNESS: This is a 54 year old female with a history of hepatitis C, alcohol dependence, tobacco dependence, who presented to Dr. Reinaldo Berber office today for follow-up on laboratories that were drawn. He saw her saw her last week when she apparently quit drinking beer. She was an avid alcohol drinker up until about year ago, where she has been drinking about 2-4 beers a day. Dr. Gustavo Lah was concerned that she has had intractable nausea and vomiting, and decreased p.o. intake for the past 2 weeks, not complaining abdominal pain. He wanted the patient to be admitted for intractable nausea and vomiting and possible EGD to evaluate for her nausea and vomiting.   REVIEW OF SYSTEMS:  CONSTITUTIONAL: No fever. Positive weakness and fatigue.  EYES: No blurred or double vision or glaucoma. ENT: No ear pain, hearing loss, seasonal allergies.  RESPIRATORY: No coughing, hemoptysis, COPD. CARDIOVASCULAR: No chest pain, orthopnea, edema, arrhythmia, dyspnea on exertion, palpitations, syncope.  GASTROINTESTINAL: Positive nausea and vomiting. No diarrhea. No abdominal pain. No melena or ulcers.  GENITOURINARY: No dysuria or hematuria. She has had decreased urine output.  ENDOCRINE: No polyuria, polydipsia, or thyroid problems.  HEMATOLOGIC AND LYMPHATIC: Positive easy bruising.  SKIN: No rash or lesions.  MUSCULOSKELETAL: No redness or gout.  NEUROLOGIC: No history of CVA, TIA or seizures.  PSYCHIATRIC: No history of bipolar depression.   PAST MEDICAL HISTORY:  1.  Hepatitis C.  2.  Alcohol dependence. 3.  Tobacco abuse.  4.  Chronic lower back pain.  MEDICATIONS: We are obtaining the medication list at this time.   ALLERGIES: No known drug allergies.   PAST  SURGICAL HISTORY: None.    FAMILY HISTORY: Positive for CVA, CAD, and brain aneurysms.   SOCIAL HISTORY: The patient smokes 1/2 pack a day and she drinks 2-4 beers a day; quit about a week.   PHYSICAL EXAMINATION:  VITAL SIGNS: Blood pressure 124/72, respirations 16, heart rate is 76, 98% on room air.  GENERAL: The patient is alert, not in acute distress, but does look kind of tremulous.  HEENT: Head is atraumatic. Pupils are round and reactive. Sclerae anicteric. Mucous membranes are dry. Oropharynx is clear.  NECK: Supple, without JVD, carotic bruit, or enlarged thyroid.  CARDIOVASCULAR: Regular rate and rhythm. No murmurs, gallops, or rubs. PMI is not displaced.  LUNGS: Clear to auscultation, without crackles, rales, rhonchi or wheezing. Normal percussion.  ABDOMEN: Bowel sounds are positive. Nontender, nondistended. No hepatosplenomegaly.  EXTREMITIES: No clubbing, cyanosis or edema.  NEUROLOGIC: Cranial nerves II through XII are intact. There are no focal deficits.  She is tremulous and she does have positive asterixis.  SKIN: Without rash or lesions.   LABORATORIES: No laboratories at this time; they have been ordered STAT.   ASSESSMENT AND PLAN: A 54 year old female who went for followup to see Dr. Gustavo Lah and was sent from his office for intractable nausea and vomiting.  1.  Intractable nausea and vomiting. The patient will obtain a 3-way of the abdomen to evaluate an abdominal etiology for this. This may be related to her alcohol withdrawal as her last drink was about a week ago. She is on supportive care with IV fluids and antiemetics. GI will see patient in consulation and plan is to obtain EGD  once nausea has improved. 2.  Tobacco dependence. We encouraged the patient to stop smoking. She was counseled for 3-1/2 minutes. She is not interested in quitting smoking at this time, nor does she want a nicotine patch.  3.  Alcohol abuse. The patient will be placed stable CIWA protocol;  despite her last drink being about a week ago, she could be going through delirium tremens possibly.  4.  Chronic lower back pain. The patient says she is on medications. We will obtain this medication list.   The patient is Full Code status.    TIME SPENT: 45 minutes    ____________________________ Demetrie Borge P. Benjie Karvonen, MD spm:MT D: 11/22/2013 11:55:21 ET T: 11/22/2013 12:21:12 ET JOB#: [id number]  cc: Eliza Grissinger P. Benjie Karvonen, MD, <Dictator> Lollie Sails, MD Donell Beers Kawanna Christley MD ELECTRONICALLY SIGNED 11/22/2013 14:40

## 2014-07-20 NOTE — H&P (Signed)
PATIENT NAME:  Audrey Garrett, Audrey Garrett MR#:  [id number] DATE OF BIRTH:  1960-10-10  DATE OF ADMISSION:  11/22/2013  ADDENDUM:  We now have the patient's medication list as follows:  Clonazepam 1 mg at bedtime, Lexapro 20 mg daily, omeprazole 40 mg daily, thiamine 100 mg daily, Zanaflex 4 mg p.o. b.i.d.   The patient is now asking for a nicotine inhaler kit which we will write for.    ____________________________ Misheel Gowans P. Benjie Karvonen, MD spm:lt D: 11/22/2013 15:24:39 ET T: 11/22/2013 15:30:18 ET JOB#: [id number]  cc: Krysten Veronica P. Benjie Karvonen, MD, <Dictator> Donell Beers Yitta Gongaware MD ELECTRONICALLY SIGNED 11/22/2013 16:17

## 2014-07-20 NOTE — Discharge Summary (Signed)
PATIENT NAME:  Audrey Garrett, Audrey Garrett MR#:  [id number] DATE OF BIRTH:  September 04, 1960  DATE OF ADMISSION:  11/23/2013  DATE OF DISCHARGE:  11/26/2013  PRIMARY CARE PHYSICIAN: None.   REFERRING PHYSICIAN: Her primary gastroenterologist, Dr. Gustavo Lah.   CHIEF COMPLAINT AT THE TIME OF ADMISSION: Nausea and vomiting.   DISCHARGE DIAGNOSES: 1.  Nausea and vomiting, intractable from gastroparesis.  2.  Acute depression.  3.  Alcohol abuse.  4.  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   PROCEDURES: EGD was done on 08/31, which has revealed gastroparesis.   CONSULTATIONS: Gastroenterology regarding intractable nausea and vomiting; palliative care regarding pain management; psychiatry regarding acute depression.   BRIEF HISTORY OF PRESENT ILLNESS AND HOSPITAL COURSE:  1.  The patient is a 54 year old female with history of hepatitis C and alcohol dependence, seen by Dr. Gustavo Lah at his office and was sent over to the hospital for intractable nausea and vomiting. Please see the H and P for details. During the hospital course, the patient was complaining of intractable nausea and vomiting with abdominal distention. The patient was given antinausea medication and a GI consult was placed. The patient was given IV fluids as well.  Subsequently, nausea and vomiting significantly improved. DrMarland Kitchen Gustavo Lah has done an EGD today, which has revealed gastroparesis. He asked her to take full liquid diet today and start a low-residue diet tomorrow. The patient is to continue Protonix, and Reglan is started on her regimen. The patient is to take Reglan 5 mg twice a day before breakfast and at dinner time.  2.  Acute depression. The patient was found to be acutely depressed with suicidal ideation during the hospital course. Psychiatric was consulted. The patient was depressed from her chronic low back pain. Her escitalopram was discontinued and Wellbutrin was started. They have recommended that the patient see outpatient  psychiatry for follow up regarding her acute depression and counseling as well.   Subsequently, her suicidal ideations were resolved and the patient started feeling better.  4.  Chronic low back pain from scoliosis. The patient a scoliosis specialist as an outpatient regarding her chronic low back pain. As she was acutely depressed from severe chronic low back pain, palliative care was consulted regarding pain management. The patient refused to be on long-acting pain medications as she failed that regimen in the past. Palliative care has recommended to continue her home medication, oxycodone 20 mg p.o. q. 8 hours as needed for pain. Her muscle relaxant and home medication, Zanaflex, was discontinued by gastroenterology in view of potential drug interactions with other medications. But, the patient was in tears and very upset as she was on that medication for several months before and she did not encounter any interactions or adverse reactions from that medicine. She wants to discuss with her scoliosis doctor regarding muscle relaxant and she has requested to resume her Zanaflex. It was resumed after discussing the interactions and adverse effects of Zanaflex with other medications. The patient is aware.  5.  The patient has a history of alcohol abuse and hepatitis C. She was found to be thrombocytopenic, which was presumed to be from her liver disorder and alcohol abuse. She was placed on CIWA protocol. No bleeding or bruises were noticed. The patient was counseled to go to outpatient rehabilitation.  6.  Nicotine abuse. Counseled to quit smoking. The patient is to continue nicotine patch as an outpatient.   Overall, her condition was stable.  Decision is made to discharge the patient home with a full liquid  diet today. The patient was seen by dietician regarding low-residue diet, which needs to be started from tomorrow.   SIGNIFICANT LABORATORY DATA AND IMAGING STUDIES: Per GI, endoscopy today has revealed  gastroparesis.  GI has recommended to perform H. pylori serology today, but the patient wants to go home as soon as possible. GI to consider getting this pylori serology as an outpatient. BUN 2, creatinine is normal. Sodium and potassium are normal. Glucose at 157. GFR is greater than 60. Magnesium is 2.2, calcium 8.3. WBC 4.1, hemoglobin 10.3, hematocrit 31.6, platelets are 113,000. Urinalysis: Nitrites and leukocyte esterase are negative.   DISCHARGE MEDICATIONS: Reglan 5 mg p.o. q. 12 hours prior to breakfast and dinner, milk of magnesia 30 mL p.o. 2 times a day as needed for constipation, magnesium oxide 400 mg p.o. once daily, omeprazole 40 mg 1 capsule p.o. once daily in the a.m., multivitamin 1 tablet p.o. once daily, nicotine inhaler kit, Ensure 1 can 3 times a day with meals, tizanidine 2 mg take 2 tablets p.o. once a day, furosemide 20 mg once daily, Plavix 1 mg p.o. once daily at bedtime, bupropion 150 mg p.o. once daily, oxycodone 20 mg p.o. q. 8 hours as needed for pain, spironolactone 20 mg p.o. once daily with meals, thiamine 100 mg p.o. once daily.   The patient's home medications:  Escitalopram, hydrochlorothiazide, lisinopril are discontinued.  Hydrochlorothiazide and lisinopril was discontinued in view of low blood pressure.  Amoxicillin 875 mg is discontinued.   DIET: Low fat, low cholesterol, low residue; dietary supplement Ensure 3 times a day.   ACTIVITY: As tolerated.   FOLLOWUP: Follow up with primary care physician in one week and  GI in 2 weeks.  Gastroenterology to consider getting H. pylori serology as it was not done, and the patient wants to be discharged as soon as possible. Also, the patient is to follow up with the scoliosis specialist as an outpatient in 1 to 2 weeks and psychiatry in a week regarding acute depression.   The diagnosis and plan of care was discussed in detail with the patient and her husband at the bedside. They both verbalized understanding of the plan.    TOTAL TIME SPENT:  Forty minutes.    ____________________________ Nicholes Mango, MD ag:lr D: 11/26/2013 15:51:48 ET T: 11/26/2013 16:25:05 ET JOB#: [id number]  cc: Nicholes Mango, MD, <Dictator> Lollie Sails, MD  Nicholes Mango MD ELECTRONICALLY SIGNED 12/08/2013 15:29 Nicholes Mango MD ELECTRONICALLY SIGNED 12/08/2013 16:18

## 2014-07-20 NOTE — Consult Note (Signed)
Chief Complaint:  Subjective/Chief Complaint Nausea better. Tolerating solids.Abdomen sore from all the retching at home. Abd still distended. C/O swelling in feet, esp right foot where patient had hematoma in the past. No BM in 1 week. Has chronic back pain. On muscle relaxant, which may help.   VITAL SIGNS/ANCILLARY NOTES: **Vital Signs.:   30-Aug-15 08:36  Vital Signs Type Routine  Temperature Temperature (F) 98.3  Celsius 36.8  Temperature Source oral  Pulse Pulse 49  Respirations Respirations 20  Systolic BP Systolic BP 037  Diastolic BP (mmHg) Diastolic BP (mmHg) 74  Mean BP 87  Pulse Ox % Pulse Ox % 97  Pulse Ox Activity Level  At rest  Oxygen Delivery Room Air/ 21 %   Brief Assessment:  GEN no acute distress   Cardiac Regular   Respiratory normal resp effort   Gastrointestinal distended with ascites. mild tenderness   Additional Physical Exam Pedal edema. Right foot swollen.   Lab Results: Routine Chem:  29-Aug-15 06:12   Magnesium, Serum  1.3 (1.8-2.4 THERAPEUTIC RANGE: 4-7 mg/dL TOXIC: > 10 mg/dL  -----------------------)  Glucose, Serum  172  BUN  3  Creatinine (comp) 0.77  Sodium, Serum 141  Potassium, Serum 4.2  Chloride, Serum  110  CO2, Serum 23  Calcium (Total), Serum  7.1  Anion Gap 8  Osmolality (calc) 282  eGFR (African American) >60  eGFR (Non-African American) >60 (eGFR values <103mL/min/1.73 m2 may be an indication of chronic kidney disease (CKD). Calculated eGFR is useful in patients with stable renal function. The eGFR calculation will not be reliable in acutely ill patients when serum creatinine is changing rapidly. It is not useful in  patients on dialysis. The eGFR calculation may not be applicable to patients at the low and high extremes of body sizes, pregnant women, and vegetarians.)  Routine Hem:  29-Aug-15 06:12   WBC (CBC)  3.2  RBC (CBC)  2.70  Hemoglobin (CBC)  9.9  Hematocrit (CBC)  30.5  Platelet Count (CBC)  67  MCV   113  MCH  36.6  MCHC 32.4  RDW 14.4  Neutrophil % 41.3  Lymphocyte % 43.0  Monocyte % 10.9  Eosinophil % 4.0  Basophil % 0.8  Neutrophil #  1.3  Lymphocyte # 1.4  Monocyte # 0.4  Eosinophil # 0.1  Basophil # 0.0 (Result(s) reported on 24 Nov 2013 at 07:12AM.)   Assessment/Plan:  Assessment/Plan:  Assessment Nausea better. Abdomen muscle soreness from retching? Ascites/pedal edema.   Plan For EGD tomorrow with Dr. Gustavo Lah. Will recheck INR since it was 1.6 few days ago. Pt still getting IVF with KCl. Will stop. Will start low dose lasix and aldactone. Since there is a major interaction with zanaflex, will hold muscle relaxant for few days until we get diuretics going. Dr. Gustavo Lah to see patient tomorrow. Thanks.   Electronic Signatures: Verdie Shire (MD)  (Signed 30-Aug-15 09:33)  Authored: Chief Complaint, VITAL SIGNS/ANCILLARY NOTES, Brief Assessment, Lab Results, Assessment/Plan   Last Updated: 30-Aug-15 09:33 by Verdie Shire (MD)

## 2014-07-20 NOTE — Consult Note (Signed)
Chief Complaint:  Subjective/Chief Complaint admitted for intractible n/v. minimal nausea today, tolerating clears. events noted. no bm.   VITAL SIGNS/ANCILLARY NOTES: **Vital Signs.:   28-Aug-15 12:00  Vital Signs Type Q 4hr  Temperature Temperature (F) 98.1  Celsius 36.7  Pulse Pulse 62  Respirations Respirations 20  Systolic BP Systolic BP 161  Diastolic BP (mmHg) Diastolic BP (mmHg) 63  Mean BP 78  Pulse Ox % Pulse Ox % 96  Pulse Ox Activity Level  At rest  Oxygen Delivery Room Air/ 21 %   Brief Assessment:  Cardiac Regular   Respiratory clear BS   Gastrointestinal details normal Soft  Nondistended  No masses palpable  Bowel sounds normal  tender to palpation in the luq   Lab Results: Routine Chem:  27-Aug-15 11:45   Potassium, Serum  2.9    11:46   Potassium, Serum -  28-Aug-15 03:49   Phosphorus, Serum 3.6 (Result(s) reported on 23 Nov 2013 at 08:26AM.)  Magnesium, Serum  0.8 (1.8-2.4 THERAPEUTIC RANGE: 4-7 mg/dL TOXIC: > 10 mg/dL  -----------------------)  Glucose, Serum  139  BUN  5  Creatinine (comp) 0.83  Sodium, Serum 138  Potassium, Serum  2.7  Chloride, Serum 102  CO2, Serum 30  Calcium (Total), Serum  7.1  Anion Gap  6  Osmolality (calc) 275  eGFR (African American) >60  eGFR (Non-African American) >60 (eGFR values <38mL/min/1.73 m2 may be an indication of chronic kidney disease (CKD). Calculated eGFR is useful in patients with stable renal function. The eGFR calculation will not be reliable in acutely ill patients when serum creatinine is changing rapidly. It is not useful in  patients on dialysis. The eGFR calculation may not be applicable to patients at the low and high extremes of body sizes, pregnant women, and vegetarians.)  Routine Coag:  27-Aug-15 13:59   INR 1.6 (INR reference interval applies to patients on anticoagulant therapy. A single INR therapeutic range for coumarins is not optimal for all indications; however, the suggested  range for most indications is 2.0 - 3.0. Exceptions to the INR Reference Range may include: Prosthetic heart valves, acute myocardial infarction, prevention of myocardial infarction, and combinations of aspirin and anticoagulant. The need for a higher or lower target INR must be assessed individually. Reference: The Pharmacology and Management of the Vitamin K  antagonists: the seventh ACCP Conference on Antithrombotic and Thrombolytic Therapy. WRUEA.5409 Sept:126 (3suppl): N9146842. A HCT value >55% may artifactually increase the PT.  In one study,  the increase was an average of 25%. Reference:  "Effect on Routine and Special Coagulation Testing Values of Citrate Anticoagulant Adjustment in Patients with High HCT Values." American Journal of Clinical Pathology 2006;126:400-405.)  Routine Hem:  27-Aug-15 11:45   Platelet Count (CBC)  106    13:59   Platelet Count (CBC)  93  28-Aug-15 03:49   WBC (CBC) 3.6  RBC (CBC)  2.58  Hemoglobin (CBC)  9.6  Hematocrit (CBC)  28.3  Platelet Count (CBC)  66  MCV  110  MCH  37.2  MCHC 33.9  RDW 14.5  Neutrophil % 25.5  Lymphocyte % 58.5  Monocyte % 9.3  Eosinophil % 6.1  Basophil % 0.6  Neutrophil #  0.9  Lymphocyte # 2.1  Monocyte # 0.3  Eosinophil # 0.2  Basophil # 0.0 (Result(s) reported on 23 Nov 2013 at 04:24AM.)   Radiology Results: XRay:    27-Aug-15 14:18, Abdomen Complete 3 or more views of abd  Abdomen Complete 3 or  more views of abd   REASON FOR EXAM:    nausea/vomiting  COMMENTS:       PROCEDURE: DXR - DXR ABDOMEN COMPLETE  - Nov 22 2013  2:18PM     CLINICAL DATA:  Nausea, vomiting.    EXAM:  ABDOMEN SERIES    COMPARISON:  None.    FINDINGS:  There is no evidence of dilated bowelloops or free intraperitoneal  air. Status post surgical posterior fusion extending from lower  thoracic spine into the sacrum and iliac wings. No renal calculi are  noted.     IMPRESSION:  No evidence of bowel obstruction or  ileus.      Electronically Signed    By: Sabino Dick M.D.    On: 11/22/2013 14:26         Verified By: Marveen Reeks, M.D.,   Assessment/Plan:  Assessment/Plan:  Assessment 1) intractible n/v.  tolerating clears.   2) cirrhosis-multifactorial- etoh abuse and hepatitis C. 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  This on its own would likely not put patietn at risk for cirrhosis, though in combination with [facility] and etoh abuse likely increased the fe storage noted in previous bx and risk for more advanced disease.  3) peripheral edema-mild hypoalbuminemia secondary to #2 above.   Plan 1) EGD monday.  I have discussed the risks benefits and complications of egd to include not limited to bleeding infection perforation and sedation and she wishes to proceed. Hopefully Will have K+ above 3.0 and plt above 60, PT Inr less than 1.5.Marland Kitchen 2) recommend echocardiogram, r/o etoh related CM.  3) would start mild diuretics when clinically feasible-lasix 20 mg po daily and aldactone 25 mg po daily.  4) trial full liquids as feasible tomorrow.  I will ask Dr Candace Cruise to see over the weekend.   Electronic Signatures: Loistine Simas (MD)  (Signed 28-Aug-15 16:52)  Authored: Chief Complaint, VITAL SIGNS/ANCILLARY NOTES, Brief Assessment, Lab Results, Radiology Results, Assessment/Plan   Last Updated: 28-Aug-15 16:52 by Loistine Simas (MD)

## 2014-07-20 NOTE — Consult Note (Signed)
Brief Consult Note: Diagnosis: intractible nausea and vomiting.   Patient was seen by consultant.   Consult note dictated.   Recommend further assessment or treatment.   Discussed with Attending MD.   Comments: Please see full GI consult [phone].  Patietn admitted from o/p clinic with a week of n/v unable to hold down liquids and food.  Concern for etoh withdrawl due to h/o etoh abuse.  Also h/o cirrhosis, hepatitis C and evaluation for posible hemochromatosis ongoing as outpatient.  EGD when clinically feasible. Appreciate [facility] assistance.  Electronic Signatures: Loistine Simas (MD)  (Signed 27-Aug-15 16:34)  Authored: Brief Consult Note   Last Updated: 27-Aug-15 16:34 by Loistine Simas (MD)

## 2014-07-20 NOTE — Consult Note (Signed)
PATIENT NAME:  Audrey Garrett, ROUND MR#:  [id number] DATE OF BIRTH:  1960/07/28  DATE OF CONSULTATION:  11/22/2013  REFERRING PHYSICIAN:   CONSULTING PHYSICIAN:  Lollie Sails, MD  PHYSICIANS: Patient of Dr. Bettey Costa and Dr. Holley Raring of [place], [place].  REASON FOR CONSULTATION: Intractable nausea, vomiting.   HISTORY OF PRESENT ILLNESS: [name] is a 54 year old Caucasian female whom I had actually seen last week in consultation in the outpatient clinic. At that time she was presenting at the request of Dr. Holley Raring for evaluation of chronic hepatitis C. She also had some complaint of epigastric abdominal pain and nausea and vomiting.  On that evaluation, there were multiple laboratories drawn. She did not have a hepatitis C virus RNA by PCR or genotype and this was ordered as well. Laboratory and ultrasound obtained from Dr. Lindwood Qua office indicated likely cirrhosis of the liver as well as a positive hepatitis C virus antibody. There was also however a history of alcohol abuse and she is an ongoing alcohol drinker. Of interest, she showed a high iron saturation and ferritin, over 800, with possible concern for hemochromatosis as well, making her liver disease multifactorial. At the time of presentation, she had been having nausea and vomiting on a daily basis for about a month. She reported a severe lower abdominal cramping that begins in the morning. There was no decrease with the bowel movement. She states she sees an occasional dark stool. There is no bright red rectal bleeding. She was having daily epigastric abdominal discomfort. There were no exacerbating or alleviating factors. She had never had EGD or colonoscopy in the past, per patient. It is of interest that about 5 years ago she did undergo evaluation in regard to her hepatitis C. At that time, she did have a percutaneous liver biopsy at [facility], 02/23/2011. This showed a chronic hepatitis, hepatitis C by history grade 2, early stage 2.  There was a 10% macrovesicular steatosis, as well as a grade 1 periportal parenchymal iron deposition. There was an early stage 2 fibrosis.   LIVER HISTORY: The patient has a history of multiple tattoos being placed a little over 20 years ago and multiple since that time. Her ex-husband had been in prison and had died of an overdose and she was uncertain of his hepatitis status. She had traveled to [place], [place] the [place]. There was no military experience or incarceration. There is no family history of liver disease. She recollected no episodes of jaundice. 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 She was currently drinking at that time last week, 2 to 3 beers daily, and had been for about a year. However, up to the previous years, she had been drinking much heavier. This was for a number of years. 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 She had had a history of blood transfusion in the past. There is no history of other partners with possible hepatitis.   She did have an abdominal ultrasound on 10/13/2010 showing sludge as well as an elongated distended gallbladder. The caliber of the bile duct was normal at 4 mm, pancreas was normal, no definite evidence of a focal hepatic mass. Laboratories at that time showed her to have a hepatitis panel positive for hepatitis C virus antibody, negative for B-core IgM, negative for B-surface antigen, negative hepatitis A antibody IgM, this being an acute panel  apparently. She had a ferritin of 834 on a scale of 8 to 388. She had a hemoglobin and hematocrit of 12.2/36.2, platelet count of 93,000. AST and ALT were 71 and 31 respectively, alkaline phosphatase 86, albumin 3.0, bilirubin total 1.1. Serum iron 188, iron saturation 95%.   The patient presented earlier today to the GI outpatient clinic again for followup. Multiple laboratories had been  obtained. She was feeling very much worse. She stated that she had stopped drinking after I had seen her in the office a week ago. Since that time, she was having problems with nausea and vomiting, she could not keep any foods or liquids down, she felt like she was becoming dehydrated.   PAST MEDICAL HISTORY:  History of hypertension, hepatitis as noted, depression. She has had back surgery with rods extending from the upper thoracic through the lumbar region. She has had a tubal ligation, as well as a tonsillectomy.   OUTPATIENT MEDICATIONS: Included clonazepam 1 mg twice a day as needed for anxiety, Lexapro 20 mg once a day, Lasix 40 mg once a day. She only takes this occasionally. Ondansetron 4 mg ODT, Roxicodone 20 mg immediate release t.i.d. p.r.n., Zanaflex 4 mg twice a day.   ALLERGIES: No known drug allergies.   GASTROINTESTINAL FAMILY HISTORY: Noncontributory. She does have a remote family member who also had cirrhosis of the liver secondary to alcohol use.   REVIEW OF SYSTEMS: Ten systems reviewed. Pertinent for weakness, fatigue, depression, anxiety, muscle and joint pains, swelling in the lower extremities, numbness and tingling distal extremities. GI to include abdominal bloating, nausea, vomiting, difficulty swallowing, indigestion, diarrhea, black stools occasionally, loss of appetite; however, the patient denies any weight loss.   LABORATORY DATA: Obtained as an outpatient last week included a hepatitis C virus PCR with genotype that was positive 5.541 log 10 and the genotype was a 1A. Her alpha-fetoprotein was slightly elevated at 10.1 on a scale of 0 to 8.3. This is quite likely due to her chronic hepatitis C giving her a nonspecific release of different proteins and antibodies. Alpha-1 antitrypsin phenotype was normal at MM. Her ammonia was elevated at 158 on a scale of 19 to 87. It is of note that she showed no asterixis. Hepatic profile showing a total protein of 6.2, albumin 2.9,  total bilirubin 1.8, conjugated bilirubin 0.7, alkaline phosphatase 88, AST 99, ALT 43. Her pro time was 15.6, INR 1.4. Hemogram showing white count of 5.3, H and H 11.3/32.9, platelet count of 103,000. Metabolic panel showing a BUN of 3, creatinine 0.5, potassium is low at 3.5. Her antimitochondrial antibody was normal. Her an anti-smooth muscle antibody was normal. Testing for hemochromatosis still pending.   ASSESSMENT:  1.  Intractable nausea and vomiting. I am concerned that this could be related to her having stopped alcohol use last week and she is at relatively higher risk for withdrawal symptoms. She was having some nausea, vomiting and abdominal pain prior; however, did seem to be holding some things down.  2.  Cirrhosis of the liver, likely multifactorial with positive hepatitis C virus antibody and RNA testing. She has a history of alcohol abuse ongoing. Concern for possible hemochromatosis. Laboratories pending on the outpatient basis.  3.  Epigastric abdominal pain, this seems to have improved over the period of the past week. She does have some mild left lower abdominal pain toward the umbilicus. There are no masses felt on evaluation.   RECOMMENDATIONS:  1.  We will admit her for  treatment of the intractable nausea and vomiting. We will give supportive care with IV fluids and antiemetics. We will obtain an EGD when clinically feasible.  2.  Alcohol abuse. Counseled patient in regard to cessation, particularly in light of her positive, hepatitis C status, this putting her at greater risk for liver disease. There is likely a certain amount of cirrhosis as noted above.  3.  Chronic back pain. She is due to be seen by her spine doctor in the next couple of weeks and apparently a CT scan had been arranged for further evaluation. This may need to be put on hold until she is feeling better from this episode.   I appreciate the assistance of Dr. Carlota Raspberry doctor in this admission and agree with  [facility] protocol. Will follow with you.   ____________________________ Lollie Sails, MD mus:TT D: 11/22/2013 16:30:34 ET T: 11/22/2013 21:52:13 ET JOB#: [id number]  cc: Lollie Sails, MD, <Dictator> Lollie Sails MD ELECTRONICALLY SIGNED 11/29/2013 14:54

## 2014-12-25 DIAGNOSIS — M545 Low back pain, unspecified: Secondary | ICD-10-CM

## 2014-12-25 DIAGNOSIS — I152 Hypertension secondary to endocrine disorders: Secondary | ICD-10-CM | POA: Insufficient documentation

## 2014-12-25 DIAGNOSIS — I10 Essential (primary) hypertension: Secondary | ICD-10-CM | POA: Insufficient documentation

## 2014-12-25 DIAGNOSIS — G8929 Other chronic pain: Secondary | ICD-10-CM

## 2014-12-25 DIAGNOSIS — E1159 Type 2 diabetes mellitus with other circulatory complications: Secondary | ICD-10-CM | POA: Insufficient documentation

## 2014-12-25 DIAGNOSIS — G4700 Insomnia, unspecified: Secondary | ICD-10-CM | POA: Insufficient documentation

## 2014-12-25 DIAGNOSIS — F325 Major depressive disorder, single episode, in full remission: Secondary | ICD-10-CM | POA: Insufficient documentation

## 2014-12-25 DIAGNOSIS — B1920 Unspecified viral hepatitis C without hepatic coma: Secondary | ICD-10-CM | POA: Insufficient documentation

## 2014-12-25 HISTORY — DX: Other chronic pain: G89.29

## 2014-12-25 HISTORY — DX: Low back pain, unspecified: M54.50

## 2015-01-01 ENCOUNTER — Encounter: Payer: Self-pay | Admitting: Pain Medicine

## 2015-01-01 ENCOUNTER — Ambulatory Visit: Payer: Federal, State, Local not specified - PPO | Attending: Pain Medicine | Admitting: Pain Medicine

## 2015-01-01 VITALS — BP 119/64 | HR 65 | Temp 98.3°F | Resp 18 | Ht 72.0 in | Wt 154.0 lb

## 2015-01-01 DIAGNOSIS — M545 Low back pain: Secondary | ICD-10-CM | POA: Diagnosis not present

## 2015-01-01 DIAGNOSIS — M542 Cervicalgia: Secondary | ICD-10-CM | POA: Diagnosis not present

## 2015-01-01 DIAGNOSIS — M5442 Lumbago with sciatica, left side: Secondary | ICD-10-CM

## 2015-01-01 DIAGNOSIS — M7918 Myalgia, other site: Secondary | ICD-10-CM

## 2015-01-01 DIAGNOSIS — G9619 Other disorders of meninges, not elsewhere classified: Secondary | ICD-10-CM

## 2015-01-01 DIAGNOSIS — M79605 Pain in left leg: Secondary | ICD-10-CM | POA: Diagnosis not present

## 2015-01-01 DIAGNOSIS — M5432 Sciatica, left side: Secondary | ICD-10-CM | POA: Insufficient documentation

## 2015-01-01 DIAGNOSIS — M791 Myalgia: Secondary | ICD-10-CM

## 2015-01-01 DIAGNOSIS — Z79891 Long term (current) use of opiate analgesic: Secondary | ICD-10-CM

## 2015-01-01 DIAGNOSIS — M539 Dorsopathy, unspecified: Secondary | ICD-10-CM

## 2015-01-01 DIAGNOSIS — M546 Pain in thoracic spine: Secondary | ICD-10-CM | POA: Diagnosis not present

## 2015-01-01 DIAGNOSIS — M5412 Radiculopathy, cervical region: Secondary | ICD-10-CM | POA: Insufficient documentation

## 2015-01-01 DIAGNOSIS — G96198 Other disorders of meninges, not elsewhere classified: Secondary | ICD-10-CM | POA: Insufficient documentation

## 2015-01-01 DIAGNOSIS — G8929 Other chronic pain: Secondary | ICD-10-CM | POA: Insufficient documentation

## 2015-01-01 DIAGNOSIS — M549 Dorsalgia, unspecified: Secondary | ICD-10-CM | POA: Insufficient documentation

## 2015-01-01 DIAGNOSIS — M51369 Other intervertebral disc degeneration, lumbar region without mention of lumbar back pain or lower extremity pain: Secondary | ICD-10-CM

## 2015-01-01 DIAGNOSIS — M5416 Radiculopathy, lumbar region: Secondary | ICD-10-CM

## 2015-01-01 DIAGNOSIS — Z5181 Encounter for therapeutic drug level monitoring: Secondary | ICD-10-CM

## 2015-01-01 DIAGNOSIS — M5136 Other intervertebral disc degeneration, lumbar region: Secondary | ICD-10-CM | POA: Diagnosis not present

## 2015-01-01 DIAGNOSIS — M961 Postlaminectomy syndrome, not elsewhere classified: Secondary | ICD-10-CM

## 2015-01-01 DIAGNOSIS — M533 Sacrococcygeal disorders, not elsewhere classified: Secondary | ICD-10-CM

## 2015-01-01 DIAGNOSIS — M4726 Other spondylosis with radiculopathy, lumbar region: Secondary | ICD-10-CM

## 2015-01-01 DIAGNOSIS — G894 Chronic pain syndrome: Secondary | ICD-10-CM | POA: Diagnosis not present

## 2015-01-01 DIAGNOSIS — F1190 Opioid use, unspecified, uncomplicated: Secondary | ICD-10-CM | POA: Diagnosis not present

## 2015-01-01 DIAGNOSIS — Z79899 Other long term (current) drug therapy: Secondary | ICD-10-CM

## 2015-01-01 DIAGNOSIS — M47816 Spondylosis without myelopathy or radiculopathy, lumbar region: Secondary | ICD-10-CM

## 2015-01-01 DIAGNOSIS — F1120 Opioid dependence, uncomplicated: Secondary | ICD-10-CM

## 2015-01-01 HISTORY — DX: Other intervertebral disc degeneration, lumbar region: M51.36

## 2015-01-01 HISTORY — DX: Other intervertebral disc degeneration, lumbar region without mention of lumbar back pain or lower extremity pain: M51.369

## 2015-01-01 MED ORDER — OXYCODONE HCL 20 MG PO TABS: 20.0000 mg | ORAL_TABLET | Freq: Four times a day (QID) | ORAL | Status: DC | PRN

## 2015-01-01 MED ORDER — OXYCODONE HCL 20 MG PO TABS: 1.0000 | ORAL_TABLET | Freq: Four times a day (QID) | ORAL | Status: DC

## 2015-01-01 NOTE — Progress Notes (Signed)
Safety precautions to be maintained throughout the outpatient stay will include: orient to surroundings, keep bed in low position, maintain call bell within reach at all times, provide assistance with transfer out of bed and ambulation.  

## 2015-01-01 NOTE — Progress Notes (Signed)
Pre procedure instructions given with teach back 3 done.

## 2015-01-01 NOTE — Progress Notes (Signed)
[name] Name: Audrey Garrett MRN: [id number] DOB: [birth year] DOS: 01/01/2015 Primary Care Physician: Audrey Ruths., MD Location: [facility] Note by: Kathlen Brunswick. Dossie Arbour, M.D, DABA, DABAPM, DABPM, DABIPP, FIPP  Primary Reason(s) for Visit: Encounter for Medication Management. CC: Back Pain  HPI:   Audrey Garrett is a 54 y.o. year old, female patient, seen today for evaluation and possible adjustment of analgesic pharmacological regimen. She has ACUTE SINUSITIS, UNSPECIFIED; BRONCHITIS, ACUTE; Chronic LBP; Insomnia, persistent; Major depression in remission ([name]); BP (high blood pressure); Bilateral low back pain with left-sided sciatica; Left leg pain; Chronic pain syndrome; Neck pain; Cervical radicular pain; Lumbar radicular pain; Sacroiliac pain; Other long term (current) drug therapy; Uncomplicated opioid dependence ([name]); Long term prescription opiate use; Lumbar facet arthropathy; Failed back surgical syndrome; Facet syndrome, lumbar; Epidural fibrosis; Osteoarthritis of spine with radiculopathy, lumbar region; DDD (degenerative disc disease), lumbar; Pain of paraspinal muscle; Low back pain with radiation; Opiate use; Encounter for therapeutic drug level monitoring; Upper back pain; and Notalgia on her problem list..The Patient is scheduled to return today for evaluation of analgesic regimen and possible medication adjustment(s). The patient complains primarily of Back Pain    Symptoms: Patient confirms worsening when therapy is absent or decreased. Pharmacotherapy Review: Side-effects or Adverse reactions: None reported. Effectiveness: Described as relatively effective, allowing for increase in activities of daily living (ADL). Onset of action: Within normal limits for current pharmacological therapy. Duration: Within normal limits for medication. Peak effect: Timing and results are as within normal expected parameters. [place] PMP: Compliant with practice rules  and regulations. DST: Compliant with practice rules and regulations. Lab work: No new labs ordered by our practice. Treatment compliance: Compliant. Substance Use Disorder (SUD) Risk Level: Low Planned course of action: Continue therapy as is. Allergies: Audrey Garrett has No Known Allergies. Meds: The patient has a current medication list which includes the following prescription(s): escitalopram, furosemide, lisinopril-hydrochlorothiazide, multivitamin with minerals, oxycodone hcl, clonazepam, ondansetron, oxycodone hcl, oxycodone hcl, pantoprazole, and tizanidine.  Requested Prescriptions   Signed Prescriptions Disp Refills  . Oxycodone HCl 20 MG TABS 120 tablet 0    Sig: Take 1 tablet (20 mg total) by mouth 4 (four) times daily.  . Oxycodone HCl 20 MG TABS 120 tablet 0    Sig: Take 1 tablet (20 mg total) by mouth every 6 (six) hours as needed.  . Oxycodone HCl 20 MG TABS 120 tablet 0    Sig: Take 1 tablet (20 mg total) by mouth every 6 (six) hours as needed.   ROS: Constitutional: Afebrile, no chills, well hydrated and well nourished Gastrointestinal: negative Musculoskeletal:negative Neurological: negative Behavioral/Psych: negative PFSH: Medical:  Audrey Garrett  has a past medical history of Depression; Anxiety; High risk medications (not anticoagulants) long-term use; Vitamin D deficiency disease; Lumbago; Hypertension; Chronic hepatitis C ([name]); Alcohol abuse; Bronchitis; Arthropathy; Back pain; Shoulder pain; Stress headaches; Hyperglycemia; SOB (shortness of breath); Muscle spasm; Edema; Cough; Polyarthritis; Fatigue; Sinusitis; RLS (restless legs syndrome); Alcoholic cirrhosis of liver without ascites ([name]); Reflux; Fibromyalgia; and Spine disorder. Family: family history includes [name] in her father; Heart disease in her mother; Stroke in her mother. Surgical:  has past surgical history that includes Tubal ligation; Tonsillectomy; and Back surgery (12/19/2011). Tobacco:  has no  tobacco history on file. Alcohol:  has no alcohol history on file. Drug:  has no drug history on file. Physical Exam: Vitals:  Today's Vitals   01/01/15 0800 01/01/15 0801  BP: 119/64   Pulse: 65   Temp:  98.3 F (36.8 C)   TempSrc: Oral   Resp: 18   Height: 6' (1.829 m)   Weight: 154 lb (69.854 kg)   SpO2: 100%   PainSc:  6    Calculated BMI: Body mass index is 20.88 kg/(m^2). General appearance: alert, cooperative, appears stated age and mild distress Eyes: conjunctivae/corneas clear. PERRL, EOM's intact. Fundi benign. Neck: no adenopathy, no carotid bruit, no JVD, supple, symmetrical, trachea midline and thyroid not enlarged, symmetric, no tenderness/mass/nodules Back: The patient presents with bilateral low back pain on hyperextension and rotation with the left side being worse. She also has evidence of a prior back surgery. Lungs: No evidence respiratory distress, no audible rales or ronchi and no use of accessory muscles of respiration Extremities: extremities normal, atraumatic, no cyanosis or edema Pulses: 2+ and symmetric Skin: Skin color, texture, turgor normal. No rashes or lesions Neurologic: Grossly normal  Assessment: Encounter Diagnosis:  Primary Diagnosis: Bilateral low back pain with left-sided sciatica [M54.42] Plan: Airyana was seen today for back pain.  Diagnoses and all orders for this visit:  Bilateral low back pain with left-sided sciatica  Left leg pain  Chronic pain syndrome  Neck pain  Cervical radicular pain  Lumbar radicular pain  Sacroiliac pain  Other long term (current) drug therapy  Uncomplicated opioid dependence ([name])  Long term prescription opiate use  Lumbar facet arthropathy -     LUMBAR FACET(MEDIAL BRANCH NERVE BLOCK) MBNB; Future  Failed back surgical syndrome  Facet syndrome, lumbar -     LUMBAR FACET(MEDIAL BRANCH NERVE BLOCK) MBNB; Future  Epidural fibrosis  Osteoarthritis of spine with radiculopathy, lumbar  region  DDD (degenerative disc disease), lumbar  Pain of paraspinal muscle  Low back pain with radiation, left  Opiate use  Encounter for therapeutic drug level monitoring  Upper back pain  Thoracic back pain, unspecified back pain laterality  Other orders -     Oxycodone HCl 20 MG TABS; Take 1 tablet (20 mg total) by mouth 4 (four) times daily. -     Oxycodone HCl 20 MG TABS; Take 1 tablet (20 mg total) by mouth every 6 (six) hours as needed. -     Oxycodone HCl 20 MG TABS; Take 1 tablet (20 mg total) by mouth every 6 (six) hours as needed.   Patient instructions:  There are no Patient Instructions on file for this visit.  Medications discontinued today:  Medications Discontinued During This Encounter  Medication Reason  . traZODone (DESYREL) 518 MG tablet Duplicate  . Oxycodone HCl 20 MG TABS Reorder    Medications administered today:  Audrey Garrett had no medications administered during this visit.  Images attached to encounter:  No images are attached to the encounter.  Scans attached to encounter:  No scans are attached to the encounter.

## 2015-01-01 NOTE — Patient Instructions (Signed)
GENERAL RISKS AND COMPLICATIONS  What are the risk, side effects and possible complications? Generally speaking, most procedures are safe.  However, with any procedure there are risks, side effects, and the possibility of complications.  The risks and complications are dependent upon the sites that are lesioned, or the type of nerve block to be performed.  The closer the procedure is to the spine, the more serious the risks are.  Great care is taken when placing the radio frequency needles, block needles or lesioning probes, but sometimes complications can occur. 1. Infection: Any time there is an injection through the skin, there is a risk of infection.  This is why sterile conditions are used for these blocks.  There are four possible types of infection. 1. Localized skin infection. 2. Central Nervous System Infection-This can be in the form of Meningitis, which can be deadly. 3. Epidural Infections-This can be in the form of an epidural abscess, which can cause pressure inside of the spine, causing compression of the spinal cord with subsequent paralysis. This would require an emergency surgery to decompress, and there are no guarantees that the patient would recover from the paralysis. 4. Discitis-This is an infection of the intervertebral discs.  It occurs in about 1% of discography procedures.  It is difficult to treat and it may lead to surgery.        2. Pain: the needles have to go through skin and soft tissues, will cause soreness.       3. Damage to internal structures:  The nerves to be lesioned may be near blood vessels or    other nerves which can be potentially damaged.       4. Bleeding: Bleeding is more common if the patient is taking blood thinners such as  aspirin, Coumadin, Ticiid, Plavix, etc., or if he/she have some genetic predisposition  such as hemophilia. Bleeding into the spinal canal can cause compression of the spinal  cord with subsequent paralysis.  This would require an  emergency surgery to  decompress and there are no guarantees that the patient would recover from the  paralysis.       5. Pneumothorax:  Puncturing of a lung is a possibility, every time a needle is introduced in  the area of the chest or upper back.  Pneumothorax refers to free air around the  collapsed lung(s), inside of the thoracic cavity (chest cavity).  Another two possible  complications related to a similar event would include: Hemothorax and Chylothorax.   These are variations of the Pneumothorax, where instead of air around the collapsed  lung(s), you may have blood or chyle, respectively.       6. Spinal headaches: They may occur with any procedures in the area of the spine.       7. Persistent CSF (Cerebro-Spinal Fluid) leakage: This is a rare problem, but may occur  with prolonged intrathecal or epidural catheters either due to the formation of a fistulous  track or a dural tear.       8. Nerve damage: By working so close to the spinal cord, there is always a possibility of  nerve damage, which could be as serious as a permanent spinal cord injury with  paralysis.       9. Death:  Although rare, severe deadly allergic reactions known as "Anaphylactic  reaction" can occur to any of the medications used.      10. Worsening of the symptoms:  We can always make thing worse.    What are the chances of something like this happening? Chances of any of this occuring are extremely low.  By statistics, you have more of a chance of getting killed in a motor vehicle accident: while driving to the hospital than any of the above occurring .  Nevertheless, you should be aware that they are possibilities.  In general, it is similar to taking a shower.  Everybody knows that you can slip, hit your head and get killed.  Does that mean that you should not shower again?  Nevertheless always keep in mind that statistics do not mean anything if you happen to be on the wrong side of them.  Even if a procedure has a 1  (one) in a 1,000,000 (million) chance of going wrong, it you happen to be that one..Also, keep in mind that by statistics, you have more of a chance of having something go wrong when taking medications.  Who should not have this procedure? If you are on a blood thinning medication (e.g. Coumadin, Plavix, see list of "Blood Thinners"), or if you have an active infection going on, you should not have the procedure.  If you are taking any blood thinners, please inform your physician.  How should I prepare for this procedure?  Do not eat or drink anything at least six hours prior to the procedure.  Bring a driver with you .  It cannot be a taxi.  Come accompanied by an adult that can drive you back, and that is strong enough to help you if your legs get weak or numb from the local anesthetic.  Take all of your medicines the morning of the procedure with just enough water to swallow them.  If you have diabetes, make sure that you are scheduled to have your procedure done first thing in the morning, whenever possible.  If you have diabetes, take only half of your insulin dose and notify our nurse that you have done so as soon as you arrive at the clinic.  If you are diabetic, but only take blood sugar pills (oral hypoglycemic), then do not take them on the morning of your procedure.  You may take them after you have had the procedure.  Do not take aspirin or any aspirin-containing medications, at least eleven (11) days prior to the procedure.  They may prolong bleeding.  Wear loose fitting clothing that may be easy to take off and that you would not mind if it got stained with Betadine or blood.  Do not wear any jewelry or perfume  Remove any nail coloring.  It will interfere with some of our monitoring equipment.  NOTE: Remember that this is not meant to be interpreted as a complete list of all possible complications.  Unforeseen problems may occur.  BLOOD THINNERS The following drugs  contain aspirin or other products, which can cause increased bleeding during surgery and should not be taken for 2 weeks prior to and 1 week after surgery.  If you should need take something for relief of minor pain, you may take acetaminophen which is found in Tylenol,m Datril, Anacin-3 and Panadol. It is not blood thinner. The products listed below are.  Do not take any of the products listed below in addition to any listed on your instruction sheet.  A.P.C or A.P.C with Codeine Codeine Phosphate Capsules #3 Ibuprofen Ridaura  ABC compound Congesprin Imuran rimadil  Advil Cope Indocin Robaxisal  Alka-Seltzer Effervescent Pain Reliever and Antacid Coricidin or Coricidin-D  Indomethacin Rufen    Alka-Seltzer plus Cold Medicine Cosprin Ketoprofen S-A-C Tablets  Anacin Analgesic Tablets or Capsules Coumadin Korlgesic Salflex  Anacin Extra Strength Analgesic tablets or capsules CP-2 Tablets Lanoril Salicylate  Anaprox Cuprimine Capsules Levenox Salocol  Anexsia-D Dalteparin Magan Salsalate  Anodynos Darvon compound Magnesium Salicylate Sine-off  Ansaid Dasin Capsules Magsal Sodium Salicylate  Anturane Depen Capsules Marnal Soma  APF Arthritis pain formula Dewitt's Pills Measurin Stanback  Argesic Dia-Gesic Meclofenamic Sulfinpyrazone  Arthritis Bayer Timed Release Aspirin Diclofenac Meclomen Sulindac  Arthritis pain formula Anacin Dicumarol Medipren Supac  Analgesic (Safety coated) Arthralgen Diffunasal Mefanamic Suprofen  Arthritis Strength Bufferin Dihydrocodeine Mepro Compound Suprol  Arthropan liquid Dopirydamole Methcarbomol with Aspirin Synalgos  ASA tablets/Enseals Disalcid Micrainin Tagament  Ascriptin Doan's Midol Talwin  Ascriptin A/D Dolene Mobidin Tanderil  Ascriptin Extra Strength Dolobid Moblgesic Ticlid  Ascriptin with Codeine Doloprin or Doloprin with Codeine Momentum Tolectin  Asperbuf Duoprin Mono-gesic Trendar  Aspergum Duradyne Motrin or Motrin IB Triminicin  Aspirin  plain, buffered or enteric coated Durasal Myochrisine Trigesic  Aspirin Suppositories Easprin Nalfon Trillsate  Aspirin with Codeine Ecotrin Regular or Extra Strength Naprosyn Uracel  Atromid-S Efficin Naproxen Ursinus  Auranofin Capsules Elmiron Neocylate Vanquish  Axotal Emagrin Norgesic Verin  Azathioprine Empirin or Empirin with Codeine Normiflo Vitamin E  Azolid Emprazil Nuprin Voltaren  Bayer Aspirin plain, buffered or children's or timed BC Tablets or powders Encaprin Orgaran Warfarin Sodium  Buff-a-Comp Enoxaparin Orudis Zorpin  Buff-a-Comp with Codeine Equegesic Os-Cal-Gesic   Buffaprin Excedrin plain, buffered or Extra Strength Oxalid   Bufferin Arthritis Strength Feldene Oxphenbutazone   Bufferin plain or Extra Strength Feldene Capsules Oxycodone with Aspirin   Bufferin with Codeine Fenoprofen Fenoprofen Pabalate or Pabalate-SF   Buffets II Flogesic Panagesic   Buffinol plain or Extra Strength Florinal or Florinal with Codeine Panwarfarin   Buf-Tabs Flurbiprofen Penicillamine   Butalbital Compound Four-way cold tablets Penicillin   Butazolidin Fragmin Pepto-Bismol   Carbenicillin Geminisyn Percodan   Carna Arthritis Reliever Geopen Persantine   Carprofen Gold's salt Persistin   Chloramphenicol Goody's Phenylbutazone   Chloromycetin Haltrain Piroxlcam   Clmetidine heparin Plaquenil   Cllnoril Hyco-pap Ponstel   Clofibrate Hydroxy chloroquine Propoxyphen         Before stopping any of these medications, be sure to consult the physician who ordered them.  Some, such as Coumadin (Warfarin) are ordered to prevent or treat serious conditions such as "deep thrombosis", "pumonary embolisms", and other heart problems.  The amount of time that you may need off of the medication may also vary with the medication and the reason for which you were taking it.  If you are taking any of these medications, please make sure you notify your pain physician before you undergo any  procedures.         Facet Blocks Patient Information  Description: The facets are joints in the spine between the vertebrae.  Like any joints in the body, facets can become irritated and painful.  Arthritis can also effect the facets.  By injecting steroids and local anesthetic in and around these joints, we can temporarily block the nerve supply to them.  Steroids act directly on irritated nerves and tissues to reduce selling and inflammation which often leads to decreased pain.  Facet blocks may be done anywhere along the spine from the neck to the low back depending upon the location of your pain.   After numbing the skin with local anesthetic (like Novocaine), a small needle is passed onto the facet joints under x-ray guidance.    You may experience a sensation of pressure while this is being done.  The entire block usually lasts about 15-25 minutes.   Conditions which may be treated by facet blocks:   Low back/buttock pain  Neck/shoulder pain  Certain types of headaches  Preparation for the injection:  1. Do not eat any solid food or dairy products within 6 hours of your appointment. 2. You may drink clear liquid up to 2 hours before appointment.  Clear liquids include water, black coffee, juice or soda.  No milk or cream please. 3. You may take your regular medication, including pain medications, with a sip of water before your appointment.  Diabetics should hold regular insulin (if taken separately) and take 1/2 normal NPH dose the morning of the procedure.  Carry some sugar containing items with you to your appointment. 4. A driver must accompany you and be prepared to drive you home after your procedure. 5. Bring all your current medications with you. 6. An IV may be inserted and sedation may be given at the discretion of the physician. 7. A blood pressure cuff, EKG and other monitors will often be applied during the procedure.  Some patients may need to have extra oxygen  administered for a short period. 8. You will be asked to provide medical information, including your allergies and medications, prior to the procedure.  We must know immediately if you are taking blood thinners (like Coumadin/Warfarin) or if you are allergic to IV iodine contrast (dye).  We must know if you could possible be pregnant.  Possible side-effects:   Bleeding from needle site  Infection (rare, may require surgery)  Nerve injury (rare)  Numbness & tingling (temporary)  Difficulty urinating (rare, temporary)  Spinal headache (a headache worse with upright posture)  Light-headedness (temporary)  Pain at injection site (serveral days)  Decreased blood pressure (rare, temporary)  Weakness in arm/leg (temporary)  Pressure sensation in back/neck (temporary)   Call if you experience:   Fever/chills associated with headache or increased back/neck pain  Headache worsened by an upright position  New onset, weakness or numbness of an extremity below the injection site  Hives or difficulty breathing (go to the emergency room)  Inflammation or drainage at the injection site(s)  Severe back/neck pain greater than usual  New symptoms which are concerning to you  Please note:  Although the local anesthetic injected can often make your back or neck feel good for several hours after the injection, the pain will likely return. It takes 3-7 days for steroids to work.  You may not notice any pain relief for at least one week.  If effective, we will often do a series of 2-3 injections spaced 3-6 weeks apart to maximally decrease your pain.  After the initial series, you may be a candidate for a more permanent nerve block of the facets.  If you have any questions, please call [phone] [facility] 

## 2015-01-09 ENCOUNTER — Other Ambulatory Visit: Payer: Self-pay | Admitting: Physician Assistant

## 2015-01-09 DIAGNOSIS — K7011 Alcoholic hepatitis with ascites: Secondary | ICD-10-CM

## 2015-01-09 DIAGNOSIS — B182 Chronic viral hepatitis C: Secondary | ICD-10-CM

## 2015-01-10 ENCOUNTER — Ambulatory Visit
Admission: RE | Admit: 2015-01-10 | Discharge: 2015-01-10 | Disposition: A | Discharge status: Home / Self Care | Payer: BLUE CROSS/BLUE SHIELD | Source: Ambulatory Visit | Attending: Physician Assistant | Admitting: Physician Assistant

## 2015-01-10 ENCOUNTER — Ambulatory Visit: Admission: RE | Admit: 2015-01-10 | Payer: BLUE CROSS/BLUE SHIELD | Source: Ambulatory Visit

## 2015-01-10 DIAGNOSIS — B182 Chronic viral hepatitis C: Secondary | ICD-10-CM

## 2015-01-10 DIAGNOSIS — K8020 Calculus of gallbladder without cholecystitis without obstruction: Secondary | ICD-10-CM | POA: Diagnosis not present

## 2015-01-10 DIAGNOSIS — R935 Abnormal findings on diagnostic imaging of other abdominal regions, including retroperitoneum: Secondary | ICD-10-CM | POA: Diagnosis not present

## 2015-01-10 DIAGNOSIS — K7011 Alcoholic hepatitis with ascites: Secondary | ICD-10-CM

## 2015-01-10 DIAGNOSIS — R188 Other ascites: Secondary | ICD-10-CM | POA: Insufficient documentation

## 2015-01-13 ENCOUNTER — Other Ambulatory Visit: Payer: Self-pay | Admitting: Physician Assistant

## 2015-01-13 DIAGNOSIS — R935 Abnormal findings on diagnostic imaging of other abdominal regions, including retroperitoneum: Secondary | ICD-10-CM

## 2015-01-14 ENCOUNTER — Encounter: Payer: Self-pay | Admitting: Pain Medicine

## 2015-01-14 ENCOUNTER — Ambulatory Visit: Payer: BLUE CROSS/BLUE SHIELD | Attending: Pain Medicine | Admitting: Pain Medicine

## 2015-01-14 VITALS — BP 97/51 | HR 63 | Temp 98.7°F | Resp 18 | Ht 72.0 in | Wt 159.0 lb

## 2015-01-14 DIAGNOSIS — F329 Major depressive disorder, single episode, unspecified: Secondary | ICD-10-CM | POA: Insufficient documentation

## 2015-01-14 DIAGNOSIS — M545 Low back pain: Secondary | ICD-10-CM | POA: Diagnosis not present

## 2015-01-14 DIAGNOSIS — M47817 Spondylosis without myelopathy or radiculopathy, lumbosacral region: Secondary | ICD-10-CM | POA: Insufficient documentation

## 2015-01-14 DIAGNOSIS — M5136 Other intervertebral disc degeneration, lumbar region: Secondary | ICD-10-CM | POA: Diagnosis not present

## 2015-01-14 DIAGNOSIS — F1190 Opioid use, unspecified, uncomplicated: Secondary | ICD-10-CM | POA: Diagnosis not present

## 2015-01-14 DIAGNOSIS — G8929 Other chronic pain: Secondary | ICD-10-CM | POA: Diagnosis not present

## 2015-01-14 DIAGNOSIS — M47816 Spondylosis without myelopathy or radiculopathy, lumbar region: Secondary | ICD-10-CM

## 2015-01-14 DIAGNOSIS — M549 Dorsalgia, unspecified: Secondary | ICD-10-CM | POA: Diagnosis present

## 2015-01-14 MED ORDER — LIDOCAINE HCL (PF) 1 % IJ SOLN: 10.0000 mL | Freq: Once | INTRAMUSCULAR | Status: DC

## 2015-01-14 MED ORDER — ROPIVACAINE HCL 2 MG/ML IJ SOLN
INTRAMUSCULAR | Status: AC
  Filled 2015-01-14: qty 10

## 2015-01-14 MED ORDER — TRIAMCINOLONE ACETONIDE 40 MG/ML IJ SUSP
INTRAMUSCULAR | Status: AC
  Filled 2015-01-14: qty 1

## 2015-01-14 MED ORDER — MIDAZOLAM HCL 5 MG/5ML IJ SOLN: 5.0000 mg | Freq: Once | INTRAMUSCULAR | Status: DC

## 2015-01-14 MED ORDER — ROPIVACAINE HCL 2 MG/ML IJ SOLN
9.0000 mL | Freq: Once | INTRAMUSCULAR | Status: AC
  Administered 2015-01-14: 09:00:00

## 2015-01-14 MED ORDER — TRIAMCINOLONE ACETONIDE 40 MG/ML IJ SUSP
40.0000 mg | Freq: Once | INTRAMUSCULAR | Status: AC
  Administered 2015-01-14: 09:00:00

## 2015-01-14 MED ORDER — FENTANYL CITRATE (PF) 100 MCG/2ML IJ SOLN: 100.0000 ug | Freq: Once | INTRAMUSCULAR | Status: DC

## 2015-01-14 MED ORDER — LACTATED RINGERS IV SOLN: 1000.0000 mL | INTRAVENOUS | Status: AC

## 2015-01-14 MED ORDER — MIDAZOLAM HCL 5 MG/5ML IJ SOLN
INTRAMUSCULAR | Status: AC
  Filled 2015-01-14: qty 5

## 2015-01-14 MED ORDER — FENTANYL CITRATE (PF) 100 MCG/2ML IJ SOLN
INTRAMUSCULAR | Status: DC
Start: 2015-01-14 — End: 2015-01-14
  Filled 2015-01-14: qty 2

## 2015-01-14 NOTE — Progress Notes (Signed)
[name] Name: Audrey Garrett MRN: [id number] DOB: 1961-02-03 DOS: 01/14/2015  Primary Reason(s) for Visit: Interventional Pain Management Treatment. CC: Back Pain   Pre-Procedure Assessment: [name] is a 54 y.o. year old, female patient, seen today for interventional treatment. She has ACUTE SINUSITIS, UNSPECIFIED; BRONCHITIS, ACUTE; Chronic LBP; Insomnia, persistent; Major depression in remission (Audrey Garrett); BP (high blood pressure); Bilateral low back pain with left-sided sciatica; Left leg pain; Chronic pain syndrome; Neck pain; Cervical radicular pain; Lumbar radicular pain; Sacroiliac pain; Other long term (current) drug therapy; Uncomplicated opioid dependence (Audrey Garrett); Long term prescription opiate use; Lumbar facet arthropathy; Failed back surgical syndrome; Facet syndrome, lumbar; Epidural fibrosis; Osteoarthritis of spine with radiculopathy, lumbar region; DDD (degenerative disc disease), lumbar; Pain of paraspinal muscle; Low back pain with radiation; Opiate use; Encounter for therapeutic drug level monitoring; Upper back pain; Notalgia; Encounter for general adult medical examination without abnormal findings; and HCV (hepatitis C virus) on her problem list.. Her primarily concern today is the Back Pain Verification of the correct person, correct site (including marking of site), and correct procedure were performed and confirmed by the patient. Today's Vitals   01/14/15 0856 01/14/15 0905 01/14/15 0915 01/14/15 0924  BP: $Re'88/51 99/42 93/61 'zMx$ 97/51  Pulse: 69 64 70 63  Temp:      Resp: $Remo'16 16 18 18  'fLGkE$ Height:      Weight:      SpO2: 95% 95% 96% 97%  PainSc:  $Remove'5  5  5   'pcPapJc$ PainLoc:      Calculated BMI: Body mass index is 21.56 kg/(m^2). Allergies: She has No Known Allergies.. Primary Diagnosis: Facet syndrome, lumbar [M54.5]  Procedure: Type: Diagnostic Medial Branch Facet Block Region: Lumbar Level: L2, L3, L4, L5, & S1 Medial Branch Level(s) Laterality: Left  Indications: Spondylosis,  Lumbosacral Region Lumbosacral Spine Pain  Consent: Secured. Under the influence of no sedatives a written informed consent was obtained, after having provided information on the risks and possible complications. To fulfill our ethical and legal obligations, as recommended by the American Medical Association's Code of Ethics, we have provided information to the patient about our clinical impression; the nature and purpose of the treatment or procedure; the risks, benefits, and possible complications of the intervention; alternatives; the risk(s) and benefit(s) of the alternative treatment(s) or procedure(s); and the risk(s) and benefit(s) of doing nothing. The patient was provided information about the risks and possible complications associated with the procedure. In the case of spinal procedures these may include, but are not limited to, failure to achieve desired goals, infection, bleeding, organ or nerve damage, allergic reactions, paralysis, and death. In addition, the patient was informed that Medicine is not an exact science; therefore, there is also the possibility of unforeseen risks and possible complications that may result in a catastrophic outcome. The patient indicated having understood very clearly. We have given the patient no guarantees and we have made no promises. Enough time was given to the patient to ask questions, all of which were answered to the patient's satisfaction.  Pre-Procedure Preparation: Safety Precautions: Allergies reviewed. Appropriate site, procedure, and patient were confirmed by following the Joint Commission's Universal Protocol (UP.01.01.01), in the form of a "Time Out". The patient was asked to confirm marked site and procedure, before commencing. The patient was asked about blood thinners, or active infections, both of which were denied. Patient was assessed for positional comfort and all pressure points were checked before starting procedure. Monitoring:  As per  clinic protocol. Infection Control Precautions: Sterile  technique used. Standard Universal Precautions were taken as recommended by the Department of [facility] for Disease Control and Prevention (CDC). Standard pre-surgical skin prep was conducted. Respiratory hygiene and cough etiquette was practiced. Hand hygiene observed. Safe injection practices and needle disposal techniques followed. SDV (single dose vial) medications used. Medications properly checked for expiration dates and contaminants. Personal protective equipment (PPE) used: Sterile double glove technique. Radiation resistant gloves. Sterile surgical gloves.  Anesthesia, Analgesia, Anxiolysis: Type: Moderate (Conscious) Sedation & Local Anesthesia. Meaningful verbal contact was maintained, with the patient at all times during the procedure. Local Anesthetic(s): Lidocaine 1% IV Access: Secured Sedation: Meaningful verbal contact was maintained at all times during the procedure. Indication(s):Anxiolysis and Analgesia.  Description of Procedure Process:  Time-out: "Time-out" completed before starting procedure, as per protocol. Position: Prone Target Area: For Lumbar Facet blocks, the target is the groove formed by the junction of the transverse process and superior articular process. For the L5 dorsal ramus, the target is the notch between superior articular process and sacral ala. For the S1 dorsal ramus, the target is the superior and lateral edge of the posterior S1 Sacral foramen. Approach: Paramedial approach. Area Prepped: Entire Posterior Lumbosacral Region Prepping solution: ChloraPrep (2% chlorhexidine gluconate and 70% isopropyl alcohol) Safety Precautions: Aspiration looking for blood return was conducted prior to all injections. At no point did we inject any substances, as a needle was being advanced. No attempts were made at seeking any paresthesias. Safe injection practices and needle disposal techniques used.  Medications properly checked for expiration dates. SDV (single dose vial) medications used. Description of the Procedure: Protocol guidelines were followed. The patient was placed in position over the fluoroscopy table. The target area was identified and the area prepped in the usual manner. Skin desensitized using vapocoolant spray. Skin & deeper tissues infiltrated with local anesthetic. Appropriate amount of time allowed to pass for local anesthetics to take effect. The procedure needle was introduced through the skin, ipsilateral to the reported pain, and advanced to the target area. Employing the "Medial Branch Technique", the needles were advanced to the angle made by the superior and medial portion of the transverse process, and the lateral and inferior portion of the superior articulating process of the targeted vertebral bodies. This area is known as "Burton's Eye" or the "Eye of the [place] Dog". A procedure needle was introduced through the skin, and this time advanced to the angle made by the superior and medial border of the sacral ala, and the lateral border of the S1 vertebral body. This last needle was later repositioned at the superior and lateral border of the posterior S1 foramen. Negative aspiration confirmed. Solution injected in intermittent fashion, asking for systemic symptoms every 0.5cc of injectate. The needles were then removed and the area cleansed, making sure to leave some of the prepping solution back to take advantage of its long term bactericidal properties. EBL: None Materials & Medications Used:  Needle(s) Used: 22g - 3.5" Spinal Needle(s) Medications Administered today: We administered ropivacaine (PF) 2 mg/ml (0.2%), triamcinolone acetonide, ropivacaine (PF) 2 mg/ml (0.2%), and triamcinolone acetonide.Please see chart orders for dosing details.  Imaging Guidance:  Type of Imaging Technique: Fluoroscopy Guidance (Spinal) Indication(s): Assistance in needle guidance and  placement for procedures requiring needle placement in or near specific anatomical locations not easily accessible without such assistance. Exposure Time: Please see nurses notes. Contrast: None required. Fluoroscopic Guidance: I was personally present in the fluoroscopy suite, where the patient was placed in position  for the procedure, over the fluoroscopy-compatible table. Fluoroscopy was manipulated, using "Tunnel Vision Technique", to obtain the best possible view of the target area, on the affected side. Parallax error was corrected before commencing the procedure. A "direction-depth-direction" technique was used to introduce the needle under continuous pulsed fluoroscopic guidance. Once the target was reached, antero-posterior, oblique, and lateral fluoroscopic projection views were taken to confirm needle placement in all planes. Permanently recorded images stored by scanning into EMR. Interpretation: Intraoperative imaging interpretation by performing Physician. Adequate needle placement confirmed. Adequate needle placement confirmed in AP, lateral, & Oblique Views. No contrast injected.  Antibiotics:  Type:  Antibiotics Given (last 72 hours)    None      Indication(s): No indications identified.  Post-operative Assessment:  Complications: No immediate post-treatment complications were observed. Disposition: Return to clinic for follow-up evaluation. The patient tolerated the entire procedure well. A repeat set of vitals were taken after the procedure and the patient was kept under observation following institutional policy, for this procedure. Post-procedural neurological assessment was performed, showing return to baseline, prior to discharge. The patient was discharged home, once institutional criteria were met. The patient was provided with post-procedure discharge instructions, including a section on how to identify potential problems. Should any problems arise concerning this procedure,  the patient was given instructions to immediately contact us, at any time, without hesitation. In any case, we plan to contact the patient by telephone for a follow-up status report regarding this interventional procedure. Comments:  Based on the patient's pain score when she left, I would not initially think that most of her pain is coming from the facet joint area. This most likely coming from inside of the canal.  Primary Care Physician: Kirk Ruths., MD Location: [facility] Note by: Kathlen Brunswick. Dossie Arbour, M.D, DABA, DABAPM, DABPM, DABIPP, FIPP  Disclaimer:  Medicine is not an exact science. The only guarantee in medicine is that nothing is guaranteed. It is important to note that the decision to proceed with this intervention was based on the information collected from the patient. The Data and conclusions were drawn from the patient's questionnaire, the interview, and the physical examination. Because the information was provided in large part by the patient, it cannot be guaranteed that it has not been purposely or unconsciously manipulated. Every effort has been made to obtain as much relevant data as possible for this evaluation. It is important to note that the conclusions that lead to this procedure are derived in large part from the available data. Always take into account that the treatment will also be dependent on availability of resources and existing treatment guidelines, considered by other Pain Management Practitioners as being common knowledge and practice, at the time of the intervention. For Medico-Legal purposes, it is also important to point out that variation in procedural techniques and pharmacological choices are the acceptable norm. The indications, contraindications, technique, and results of the above procedure should only be interpreted and judged by a Board-Certified Interventional Pain Specialist with extensive familiarity and expertise in  the same exact procedure and technique. Attempts at providing opinions without similar or greater experience and expertise than that of the treating physician will be considered as inappropriate and unethical, and shall result in a formal complaint to the state medical board and applicable specialty societies.

## 2015-01-14 NOTE — Progress Notes (Signed)
Safety precautions to be maintained throughout the outpatient stay will include: orient to surroundings, keep bed in low position, maintain call bell within reach at all times, provide assistance with transfer out of bed and ambulation.  

## 2015-01-14 NOTE — Patient Instructions (Addendum)
Smoking Cessation, Tips for Success If you are ready to quit smoking, congratulations! You have chosen to help yourself be healthier. Cigarettes bring nicotine, tar, carbon monoxide, and other irritants into your body. Your lungs, heart, and blood vessels will be able to work better without these poisons. There are many different ways to quit smoking. Nicotine gum, nicotine patches, a nicotine inhaler, or nicotine nasal spray can help with physical craving. Hypnosis, support groups, and medicines help break the habit of smoking. WHAT THINGS CAN I DO TO MAKE QUITTING EASIER?  Here are some tips to help you quit for good:  Pick a date when you will quit smoking completely. Tell all of your friends and family about your plan to quit on that date.  Do not try to slowly cut down on the number of cigarettes you are smoking. Pick a quit date and quit smoking completely starting on that day.  Throw away all cigarettes.   Clean and remove all ashtrays from your home, work, and car.  On a card, write down your reasons for quitting. Carry the card with you and read it when you get the urge to smoke.  Cleanse your body of nicotine. Drink enough water and fluids to keep your urine clear or pale yellow. Do this after quitting to flush the nicotine from your body.  Learn to predict your moods. Do not let a bad situation be your excuse to have a cigarette. Some situations in your life might tempt you into wanting a cigarette.  Never have "just one" cigarette. It leads to wanting another and another. Remind yourself of your decision to quit.  Change habits associated with smoking. If you smoked while driving or when feeling stressed, try other activities to replace smoking. Stand up when drinking your coffee. Brush your teeth after eating. Sit in a different chair when you read the paper. Avoid alcohol while trying to quit, and try to drink fewer caffeinated beverages. Alcohol and caffeine may urge you to  smoke.  Avoid foods and drinks that can trigger a desire to smoke, such as sugary or spicy foods and alcohol.  Ask people who smoke not to smoke around you.  Have something planned to do right after eating or having a cup of coffee. For example, plan to take a walk or exercise.  Try a relaxation exercise to calm you down and decrease your stress. Remember, you may be tense and nervous for the first 2 weeks after you quit, but this will pass.  Find new activities to keep your hands busy. Play with a pen, coin, or rubber band. Doodle or draw things on paper.  Brush your teeth right after eating. This will help cut down on the craving for the taste of tobacco after meals. You can also try mouthwash.   Use oral substitutes in place of cigarettes. Try using lemon drops, carrots, cinnamon sticks, or chewing gum. Keep them handy so they are available when you have the urge to smoke.  When you have the urge to smoke, try deep breathing.  Designate your home as a nonsmoking area.  If you are a heavy smoker, ask your health care provider about a prescription for nicotine chewing gum. It can ease your withdrawal from nicotine.  Reward yourself. Set aside the cigarette money you save and buy yourself something nice.  Look for support from others. Join a support group or smoking cessation program. Ask someone at home or at work to help you with your plan   to quit smoking.  Always ask yourself, "Do I need this cigarette or is this just a reflex?" Tell yourself, "Today, I choose not to smoke," or "I do not want to smoke." You are reminding yourself of your decision to quit.  Do not replace cigarette smoking with electronic cigarettes (commonly called e-cigarettes). The safety of e-cigarettes is unknown, and some may contain harmful chemicals.  If you relapse, do not give up! Plan ahead and think about what you will do the next time you get the urge to smoke. HOW WILL I FEEL WHEN I QUIT SMOKING? You  may have symptoms of withdrawal because your body is used to nicotine (the addictive substance in cigarettes). You may crave cigarettes, be irritable, feel very hungry, cough often, get headaches, or have difficulty concentrating. The withdrawal symptoms are only temporary. They are strongest when you first quit but will go away within 10-14 days. When withdrawal symptoms occur, stay in control. Think about your reasons for quitting. Remind yourself that these are signs that your body is healing and getting used to being without cigarettes. Remember that withdrawal symptoms are easier to treat than the major diseases that smoking can cause.  Even after the withdrawal is over, expect periodic urges to smoke. However, these cravings are generally short lived and will go away whether you smoke or not. Do not smoke! WHAT RESOURCES ARE AVAILABLE TO HELP ME QUIT SMOKING? Your health care provider can direct you to community resources or hospitals for support, which may include:  Group support.  Education.  Hypnosis.  Therapy.   This information is not intended to replace advice given to you by your health care provider. Make sure you discuss any questions you have with your health care provider.   Document Released: 12/12/2003 Document Revised: 04/05/2014 Document Reviewed: 08/31/2012 Elsevier Interactive Patient Education 2016 [employer] Facet Joint Block The facet joints connect the bones of the spine (vertebrae). They make it possible for you to bend, twist, and make other movements with your spine. They also prevent you from overbending, overtwisting, and making other excessive movements.  A facet joint block is a procedure where a numbing medicine (anesthetic) is injected into a facet joint. Often, a type of anti-inflammatory medicine called a steroid is also injected. A facet joint block may be done for two reasons:   Diagnosis. A facet joint block may be done as a test to see whether neck  or back pain is caused by a worn-down or infected facet joint. If the pain gets better after a facet joint block, it means the pain is probably coming from the facet joint. If the pain does not get better, it means the pain is probably not coming from the facet joint.   Therapy. A facet joint block may be done to relieve neck or back pain caused by a facet joint. A facet joint block is only done as a therapy if the pain does not improve with medicine, exercise programs, physical therapy, and other forms of pain management. LET [facility] CARE PROVIDER KNOW ABOUT:   Any allergies you have.   All medicines you are taking, including vitamins, herbs, eyedrops, and over-the-counter medicines and creams.   Previous problems you or members of your family have had with the use of anesthetics.   Any blood disorders you have had.   Other health problems you have. RISKS AND COMPLICATIONS Generally, having a facet joint block is safe. However, as with any procedure, complications can occur. Possible  complications associated with having a facet joint block include:   Bleeding.   Injury to a nerve near the injection site.   Pain at the injection site.   Weakness or numbness in areas controlled by nerves near the injection site.   Infection.   Temporary fluid retention.   Allergic reaction to anesthetics or medicines used during the procedure. BEFORE THE PROCEDURE   Follow your health care provider's instructions if you are taking dietary supplements or medicines. You may need to stop taking them or reduce your dosage.   Do not take any new dietary supplements or medicines without asking your health care provider first.   Follow your health care provider's instructions about eating and drinking before the procedure. You may need to stop eating and drinking several hours before the procedure.   Arrange to have an adult drive you home after the procedure. PROCEDURE  You may  need to remove your clothing and dress in an open-back gown so that your health care provider can access your spine.   The procedure will be done while you are lying on an X-ray table. Most of the time you will be asked to lie on your stomach, but you may be asked to lie in a different position if an injection will be made in your neck.   Special machines will be used to monitor your oxygen levels, heart rate, and blood pressure.   If an injection will be made in your neck, an intravenous (IV) tube will be inserted into one of your veins. Fluids and medicine will flow directly into your body through the IV tube.   The area over the facet joint where the injection will be made will be cleaned with an antiseptic soap. The surrounding skin will be covered with sterile drapes.   An anesthetic will be applied to your skin to make the injection area numb. You may feel a temporary stinging or burning sensation.   A video X-ray machine will be used to locate the joint. A contrast dye may be injected into the facet joint area to help with locating the joint.   When the joint is located, an anesthetic medicine will be injected into the joint through the needle.   Your health care provider will ask you whether you feel pain relief. If you do feel relief, a steroid may be injected to provide pain relief for a longer period of time. If you do not feel relief or feel only partial relief, additional injections of an anesthetic may be made in other facet joints.   The needle will be removed, the skin will be cleansed, and bandages will be applied.  AFTER THE PROCEDURE   You will be observed for 15-30 minutes before being allowed to go home. Do not drive. Have an adult drive you or take a taxi or public transportation instead.   If you feel pain relief, the pain will return in several hours or days when the anesthetic wears off.   You may feel pain relief 2-14 days after the procedure. The amount  of time this relief lasts varies from person to person.   It is normal to feel some tenderness over the injected area(s) for 2 days following the procedure.   If you have diabetes, you may have a temporary increase in blood sugar.   This information is not intended to replace advice given to you by your health care provider. Make sure you discuss any questions you have with your  health care provider.   Document Released: 08/04/2006 Document Revised: 04/05/2014 Document Reviewed: 01/03/2012 Elsevier Interactive Patient Education 2016 [employer]  What are the risk, side effects and possible complications? Generally speaking, most procedures are safe.  However, with any procedure there are risks, side effects, and the possibility of complications.  The risks and complications are dependent upon the sites that are lesioned, or the type of nerve block to be performed.  The closer the procedure is to the spine, the more serious the risks are.  Great care is taken when placing the radio frequency needles, block needles or lesioning probes, but sometimes complications can occur.  Infection: Any time there is an injection through the skin, there is a risk of infection.  This is why sterile conditions are used for these blocks.  There are four possible types of infection.  Localized skin infection.  Central Nervous System Infection-This can be in the form of Meningitis, which can be deadly.  Epidural Infections-This can be in the form of an epidural abscess, which can cause pressure inside of the spine, causing compression of the spinal cord with subsequent paralysis. This would require an emergency surgery to decompress, and there are no guarantees that the patient would recover from the paralysis.  Discitis-This is an infection of the intervertebral discs.  It occurs in about 1% of discography procedures.  It is difficult to treat and it may lead to surgery.         2. Pain: the needles have to go through skin and soft tissues, will cause soreness.       3. Damage to internal structures:  The nerves to be lesioned may be near blood vessels or    other nerves which can be potentially damaged.       4. Bleeding: Bleeding is more common if the patient is taking blood thinners such as  aspirin, Coumadin, Ticiid, Plavix, etc., or if he/she have some genetic predisposition  such as hemophilia. Bleeding into the spinal canal can cause compression of the spinal  cord with subsequent paralysis.  This would require an emergency surgery to  decompress and there are no guarantees that the patient would recover from the  paralysis.       5. Pneumothorax:  Puncturing of a lung is a possibility, every time a needle is introduced in  the area of the chest or upper back.  Pneumothorax refers to free air around the  collapsed lung(s), inside of the thoracic cavity (chest cavity).  Another two possible  complications related to a similar event would include: Hemothorax and Chylothorax.   These are variations of the Pneumothorax, where instead of air around the collapsed  lung(s), you may have blood or chyle, respectively.       6. Spinal headaches: They may occur with any procedures in the area of the spine.       7. Persistent CSF (Cerebro-Spinal Fluid) leakage: This is a rare problem, but may occur  with prolonged intrathecal or epidural catheters either due to the formation of a fistulous  track or a dural tear.       8. Nerve damage: By working so close to the spinal cord, there is always a possibility of  nerve damage, which could be as serious as a permanent spinal cord injury with  paralysis.       9. Death:  Although rare, severe deadly allergic reactions known as "Anaphylactic  reaction" can occur to any  of the medications used.      10. Worsening of the symptoms:  We can always make thing worse.  What are the chances of something like this happening? Chances of any of  this occuring are extremely low.  By statistics, you have more of a chance of getting killed in a motor vehicle accident: while driving to the hospital than any of the above occurring .  Nevertheless, you should be aware that they are possibilities.  In general, it is similar to taking a shower.  Everybody knows that you can slip, hit your head and get killed.  Does that mean that you should not shower again?  Nevertheless always keep in mind that statistics do not mean anything if you happen to be on the wrong side of them.  Even if a procedure has a 1 (one) in a 1,000,000 (million) chance of going wrong, it you happen to be that one..Also, keep in mind that by statistics, you have more of a chance of having something go wrong when taking medications.  Who should not have this procedure? If you are on a blood thinning medication (e.g. Coumadin, Plavix, see list of "Blood Thinners"), or if you have an active infection going on, you should not have the procedure.  If you are taking any blood thinners, please inform your physician.  How should I prepare for this procedure?  Do not eat or drink anything at least six hours prior to the procedure.  Bring a driver with you .  It cannot be a taxi.  Come accompanied by an adult that can drive you back, and that is strong enough to help you if your legs get weak or numb from the local anesthetic.  Take all of your medicines the morning of the procedure with just enough water to swallow them.  If you have diabetes, make sure that you are scheduled to have your procedure done first thing in the morning, whenever possible.  If you have diabetes, take only half of your insulin dose and notify our nurse that you have done so as soon as you arrive at the clinic.  If you are diabetic, but only take blood sugar pills (oral hypoglycemic), then do not take them on the morning of your procedure.  You may take them after you have had the procedure.  Do not take  aspirin or any aspirin-containing medications, at least eleven (11) days prior to the procedure.  They may prolong bleeding.  Wear loose fitting clothing that may be easy to take off and that you would not mind if it got stained with Betadine or blood.  Do not wear any jewelry or perfume  Remove any nail coloring.  It will interfere with some of our monitoring equipment.  NOTE: Remember that this is not meant to be interpreted as a complete list of all possible complications.  Unforeseen problems may occur.  BLOOD THINNERS The following drugs contain aspirin or other products, which can cause increased bleeding during surgery and should not be taken for 2 weeks prior to and 1 week after surgery.  If you should need take something for relief of minor pain, you may take acetaminophen which is found in Tylenol,m Datril, Anacin-3 and Panadol. It is not blood thinner. The products listed below are.  Do not take any of the products listed below in addition to any listed on your instruction sheet.  A.P.C or A.P.C with Codeine Codeine Phosphate Capsules #3 Ibuprofen Ridaura  ABC compound  Congesprin Imuran rimadil  Advil Cope Indocin Robaxisal  Alka-Seltzer Effervescent Pain Reliever and Antacid Coricidin or Coricidin-D  Indomethacin Rufen  Alka-Seltzer plus Cold Medicine Cosprin Ketoprofen S-A-C Tablets  Anacin Analgesic Tablets or Capsules Coumadin Korlgesic Salflex  Anacin Extra Strength Analgesic tablets or capsules CP-2 Tablets Lanoril Salicylate  Anaprox Cuprimine Capsules Levenox Salocol  Anexsia-D Dalteparin Magan Salsalate  Anodynos Darvon compound Magnesium Salicylate Sine-off  Ansaid Dasin Capsules Magsal Sodium Salicylate  Anturane Depen Capsules Marnal Soma  APF Arthritis pain formula Dewitt's Pills Measurin Stanback  Argesic Dia-Gesic Meclofenamic Sulfinpyrazone  Arthritis Bayer Timed Release Aspirin Diclofenac Meclomen Sulindac  Arthritis pain formula Anacin Dicumarol Medipren  Supac  Analgesic (Safety coated) Arthralgen Diffunasal Mefanamic Suprofen  Arthritis Strength Bufferin Dihydrocodeine Mepro Compound Suprol  Arthropan liquid Dopirydamole Methcarbomol with Aspirin Synalgos  ASA tablets/Enseals Disalcid Micrainin Tagament  Ascriptin Doan's Midol Talwin  Ascriptin A/D Dolene Mobidin Tanderil  Ascriptin Extra Strength Dolobid Moblgesic Ticlid  Ascriptin with Codeine Doloprin or Doloprin with Codeine Momentum Tolectin  Asperbuf Duoprin Mono-gesic Trendar  Aspergum Duradyne Motrin or Motrin IB Triminicin  Aspirin plain, buffered or enteric coated Durasal Myochrisine Trigesic  Aspirin Suppositories Easprin Nalfon Trillsate  Aspirin with Codeine Ecotrin Regular or Extra Strength Naprosyn Uracel  Atromid-S Efficin Naproxen Ursinus  Auranofin Capsules Elmiron Neocylate Vanquish  Axotal Emagrin Norgesic Verin  Azathioprine Empirin or Empirin with Codeine Normiflo Vitamin E  Azolid Emprazil Nuprin Voltaren  Bayer Aspirin plain, buffered or children's or timed BC Tablets or powders Encaprin Orgaran Warfarin Sodium  Buff-a-Comp Enoxaparin Orudis Zorpin  Buff-a-Comp with Codeine Equegesic Os-Cal-Gesic   Buffaprin Excedrin plain, buffered or Extra Strength Oxalid   Bufferin Arthritis Strength Feldene Oxphenbutazone   Bufferin plain or Extra Strength Feldene Capsules Oxycodone with Aspirin   Bufferin with Codeine Fenoprofen Fenoprofen Pabalate or Pabalate-SF   Buffets II Flogesic Panagesic   Buffinol plain or Extra Strength Florinal or Florinal with Codeine Panwarfarin   Buf-Tabs Flurbiprofen Penicillamine   Butalbital Compound Four-way cold tablets Penicillin   Butazolidin Fragmin Pepto-Bismol   Carbenicillin Geminisyn Percodan   Carna Arthritis Reliever Geopen Persantine   Carprofen Gold's salt Persistin   Chloramphenicol Goody's Phenylbutazone   Chloromycetin Haltrain Piroxlcam   Clmetidine heparin Plaquenil   Cllnoril Hyco-pap Ponstel   Clofibrate Hydroxy  chloroquine Propoxyphen         Before stopping any of these medications, be sure to consult the physician who ordered them.  Some, such as Coumadin (Warfarin) are ordered to prevent or treat serious conditions such as "deep thrombosis", "pumonary embolisms", and other heart problems.  The amount of time that you may need off of the medication may also vary with the medication and the reason for which you were taking it.  If you are taking any of these medications, please make sure you notify your pain physician before you undergo any procedures.         Pain Management Discharge Instructions  General Discharge Instructions :  If you need to reach your doctor call: Monday-Friday 8:00 am - 4:00 pm at [phone] or toll free [phone].  After clinic hours [phone] to have operator reach doctor.  Bring all of your medication bottles to all your appointments in the pain clinic.  To cancel or reschedule your appointment with Pain Management please remember to call 24 hours in advance to avoid a fee.  Refer to the educational materials which you have been given on: General Risks, I had my Procedure. Discharge Instructions, Post Sedation.  Post Procedure  Instructions:  The drugs you were given will stay in your system until tomorrow, so for the next 24 hours you should not drive, make any legal decisions or drink any alcoholic beverages.  You may eat anything you prefer, but it is better to start with liquids then soups and crackers, and gradually work up to solid foods.  Please notify your doctor immediately if you have any unusual bleeding, trouble breathing or pain that is not related to your normal pain.  Depending on the type of procedure that was done, some parts of your body may feel week and/or numb.  This usually clears up by tonight or the next day.  Walk with the use of an assistive device or accompanied by an adult for the 24 hours.  You may use ice on the affected  area for the first 24 hours.  Put ice in a Ziploc bag and cover with a towel and place against area 15 minutes on 15 minutes off.  You may switch to heat after 24 hours.

## 2015-01-15 ENCOUNTER — Telehealth: Payer: Self-pay

## 2015-01-15 NOTE — Telephone Encounter (Signed)
"  Im doing good"

## 2015-01-17 ENCOUNTER — Ambulatory Visit
Admission: RE | Admit: 2015-01-17 | Discharge: 2015-01-17 | Disposition: A | Discharge status: Home / Self Care | Payer: BLUE CROSS/BLUE SHIELD | Source: Ambulatory Visit | Attending: Physician Assistant | Admitting: Physician Assistant

## 2015-01-17 ENCOUNTER — Other Ambulatory Visit: Payer: Self-pay | Admitting: Physician Assistant

## 2015-01-17 DIAGNOSIS — K7460 Unspecified cirrhosis of liver: Secondary | ICD-10-CM | POA: Insufficient documentation

## 2015-01-17 DIAGNOSIS — R188 Other ascites: Secondary | ICD-10-CM | POA: Insufficient documentation

## 2015-01-17 DIAGNOSIS — N200 Calculus of kidney: Secondary | ICD-10-CM | POA: Insufficient documentation

## 2015-01-17 DIAGNOSIS — K8020 Calculus of gallbladder without cholecystitis without obstruction: Secondary | ICD-10-CM | POA: Insufficient documentation

## 2015-01-17 DIAGNOSIS — N281 Cyst of kidney, acquired: Secondary | ICD-10-CM | POA: Insufficient documentation

## 2015-01-17 DIAGNOSIS — R935 Abnormal findings on diagnostic imaging of other abdominal regions, including retroperitoneum: Secondary | ICD-10-CM

## 2015-01-17 DIAGNOSIS — K769 Liver disease, unspecified: Secondary | ICD-10-CM | POA: Insufficient documentation

## 2015-01-17 MED ORDER — IOHEXOL 350 MG/ML SOLN
100.0000 mL | Freq: Once | INTRAVENOUS | Status: AC | PRN
Start: 2015-01-17 — End: 2015-01-17
  Administered 2015-01-17: 100 mL via INTRAVENOUS

## 2015-01-17 MED ORDER — IOHEXOL 300 MG/ML  SOLN: 100.0000 mL | Freq: Once | INTRAMUSCULAR | Status: DC | PRN

## 2015-01-21 ENCOUNTER — Other Ambulatory Visit: Payer: Self-pay | Admitting: Pain Medicine

## 2015-01-28 ENCOUNTER — Ambulatory Visit: Payer: BLUE CROSS/BLUE SHIELD | Admitting: Pain Medicine

## 2015-04-03 ENCOUNTER — Other Ambulatory Visit
Admission: RE | Admit: 2015-04-03 | Discharge: 2015-04-03 | Disposition: A | Discharge status: Home / Self Care | Payer: Medicare Other | Source: Ambulatory Visit | Attending: Physician Assistant | Admitting: Physician Assistant

## 2015-04-03 ENCOUNTER — Ambulatory Visit: Payer: Medicare Other | Attending: Pain Medicine | Admitting: Pain Medicine

## 2015-04-03 ENCOUNTER — Other Ambulatory Visit
Admission: RE | Admit: 2015-04-03 | Discharge: 2015-04-03 | Disposition: A | Discharge status: Home / Self Care | Payer: Medicare Other | Source: Ambulatory Visit | Attending: Pain Medicine | Admitting: Pain Medicine

## 2015-04-03 ENCOUNTER — Other Ambulatory Visit: Payer: Self-pay | Admitting: Pain Medicine

## 2015-04-03 ENCOUNTER — Encounter: Payer: Self-pay | Admitting: Pain Medicine

## 2015-04-03 VITALS — BP 98/44 | HR 71 | Temp 98.4°F | Resp 18 | Ht 72.0 in | Wt 154.0 lb

## 2015-04-03 DIAGNOSIS — Z7189 Other specified counseling: Secondary | ICD-10-CM

## 2015-04-03 DIAGNOSIS — G8929 Other chronic pain: Secondary | ICD-10-CM | POA: Insufficient documentation

## 2015-04-03 DIAGNOSIS — Z5181 Encounter for therapeutic drug level monitoring: Secondary | ICD-10-CM | POA: Insufficient documentation

## 2015-04-03 DIAGNOSIS — M79605 Pain in left leg: Secondary | ICD-10-CM

## 2015-04-03 DIAGNOSIS — F17210 Nicotine dependence, cigarettes, uncomplicated: Secondary | ICD-10-CM | POA: Insufficient documentation

## 2015-04-03 DIAGNOSIS — Z9189 Other specified personal risk factors, not elsewhere classified: Secondary | ICD-10-CM

## 2015-04-03 DIAGNOSIS — F3289 Other specified depressive episodes: Secondary | ICD-10-CM | POA: Diagnosis not present

## 2015-04-03 DIAGNOSIS — M533 Sacrococcygeal disorders, not elsewhere classified: Secondary | ICD-10-CM | POA: Insufficient documentation

## 2015-04-03 DIAGNOSIS — M797 Fibromyalgia: Secondary | ICD-10-CM | POA: Insufficient documentation

## 2015-04-03 DIAGNOSIS — M4726 Other spondylosis with radiculopathy, lumbar region: Secondary | ICD-10-CM

## 2015-04-03 DIAGNOSIS — M545 Low back pain, unspecified: Secondary | ICD-10-CM

## 2015-04-03 DIAGNOSIS — F1190 Opioid use, unspecified, uncomplicated: Secondary | ICD-10-CM | POA: Diagnosis not present

## 2015-04-03 DIAGNOSIS — M546 Pain in thoracic spine: Secondary | ICD-10-CM | POA: Insufficient documentation

## 2015-04-03 DIAGNOSIS — Z79899 Other long term (current) drug therapy: Secondary | ICD-10-CM | POA: Insufficient documentation

## 2015-04-03 DIAGNOSIS — K625 Hemorrhage of anus and rectum: Secondary | ICD-10-CM | POA: Diagnosis present

## 2015-04-03 DIAGNOSIS — I10 Essential (primary) hypertension: Secondary | ICD-10-CM | POA: Diagnosis not present

## 2015-04-03 DIAGNOSIS — Z79891 Long term (current) use of opiate analgesic: Secondary | ICD-10-CM

## 2015-04-03 DIAGNOSIS — J019 Acute sinusitis, unspecified: Secondary | ICD-10-CM | POA: Insufficient documentation

## 2015-04-03 DIAGNOSIS — M47816 Spondylosis without myelopathy or radiculopathy, lumbar region: Secondary | ICD-10-CM | POA: Insufficient documentation

## 2015-04-03 DIAGNOSIS — M549 Dorsalgia, unspecified: Secondary | ICD-10-CM | POA: Diagnosis present

## 2015-04-03 DIAGNOSIS — B1920 Unspecified viral hepatitis C without hepatic coma: Secondary | ICD-10-CM | POA: Diagnosis not present

## 2015-04-03 DIAGNOSIS — Z87898 Personal history of other specified conditions: Secondary | ICD-10-CM

## 2015-04-03 DIAGNOSIS — M5136 Other intervertebral disc degeneration, lumbar region: Secondary | ICD-10-CM | POA: Insufficient documentation

## 2015-04-03 DIAGNOSIS — M542 Cervicalgia: Secondary | ICD-10-CM | POA: Diagnosis not present

## 2015-04-03 DIAGNOSIS — E559 Vitamin D deficiency, unspecified: Secondary | ICD-10-CM | POA: Diagnosis not present

## 2015-04-03 DIAGNOSIS — M5416 Radiculopathy, lumbar region: Secondary | ICD-10-CM

## 2015-04-03 LAB — COMPREHENSIVE METABOLIC PANEL
ALT: 42 U/L (ref 14–54)
AST: 76 U/L — ABNORMAL HIGH (ref 15–41)
Albumin: 3.2 g/dL — ABNORMAL LOW (ref 3.5–5.0)
Alkaline Phosphatase: 93 U/L (ref 38–126)
Anion gap: 5 (ref 5–15)
BUN: 7 mg/dL (ref 6–20)
CO2: 28 mmol/L (ref 22–32)
Calcium: 8.8 mg/dL — ABNORMAL LOW (ref 8.9–10.3)
Chloride: 104 mmol/L (ref 101–111)
Creatinine, Ser: 0.51 mg/dL (ref 0.44–1.00)
GFR calc Af Amer: >60 mL/min (ref >60)
GFR calc non Af Amer: >60 mL/min (ref >60)
Glucose, Bld: 149 mg/dL — ABNORMAL HIGH (ref 65–99)
Potassium: 4.1 mmol/L (ref 3.5–5.1)
Sodium: 137 mmol/L (ref 135–145)
Total Bilirubin: 1.1 mg/dL (ref 0.3–1.2)
Total Protein: 6.8 g/dL (ref 6.5–8.1)

## 2015-04-03 LAB — C-REACTIVE PROTEIN: CRP: <0.5 mg/dL (ref <1.0)

## 2015-04-03 LAB — AMMONIA: Ammonia: 21 umol/L (ref 9–35)

## 2015-04-03 LAB — VITAMIN B12: Vitamin B-12: 686 pg/mL (ref 180–914)

## 2015-04-03 LAB — MAGNESIUM: Magnesium: 1.6 mg/dL — ABNORMAL LOW (ref 1.7–2.4)

## 2015-04-03 LAB — SEDIMENTATION RATE: Sed Rate: 27 mm/hr (ref 0–30)

## 2015-04-03 MED ORDER — OXYCODONE HCL 20 MG PO TABS: 1.0000 | ORAL_TABLET | Freq: Four times a day (QID) | ORAL | Status: DC | PRN

## 2015-04-03 MED ORDER — OXYCODONE HCL 20 MG PO TABS: 20.0000 mg | ORAL_TABLET | Freq: Four times a day (QID) | ORAL | Status: DC | PRN

## 2015-04-03 NOTE — Progress Notes (Signed)
Safety precautions to be maintained throughout the outpatient stay will include: orient to surroundings, keep bed in low position, maintain call bell within reach at all times, provide assistance with transfer out of bed and ambulation.  

## 2015-04-03 NOTE — Patient Instructions (Signed)
In[facility]

## 2015-04-03 NOTE — Progress Notes (Signed)
[name] Name: Audrey Garrett MRN: [id number] DOB: [birth year] DOS: 04/03/2015  Primary Reason(s) for Visit: Encounter for Medication Management and postprocedure evaluation. CC: Back Pain   HPI:    Audrey Garrett is a 55 y.o. year old, female patient, who returns today as an established patient. She has ACUTE SINUSITIS, UNSPECIFIED; BRONCHITIS, ACUTE; Insomnia, persistent; Major depression in remission ([place]); BP (high blood pressure); Bilateral low back pain with left-sided sciatica; Chronic pain syndrome; Cervical radicular pain; Other long term (current) drug therapy; Uncomplicated opioid dependence ([place]); Long term prescription opiate use; Lumbar facet arthropathy; Failed back surgical syndrome; Lumbar facet syndrome (Bilateral); Epidural fibrosis; Osteoarthritis of spine with radiculopathy, lumbar region; Pain of paraspinal muscle; Low back pain with radiation; Opiate use; Encounter for therapeutic drug level monitoring; Notalgia paresthetica; Encounter for general adult medical examination without abnormal findings; HCV (hepatitis C virus); Chronic pain; At risk for osteopenia; Chronic low back pain (Bilateral); Lumbar spondylosis; Chronic lumbar radicular pain (Left); Chronic neck pain; Chronic lower extremity pain (Left); Chronic sacroiliac joint pain; Chronic upper back pain; Long term current use of opiate analgesic; and Encounter for chronic pain management on her problem list.. Her primarily concern today is the Back Pain     The patient returns to the clinic today for pharmacological management of his chronic pain. On the patient's last visit to the clinic on 01/14/2015 the patient underwent a diagnostic left sided lumbar facet block under fluoroscopic guidance and IV sedation. She did get good relief of the pain for the duration of local anesthetic but no long-term benefit suggesting either persistent inflammatory pathology or degenerative compressive pathology. Because today's labs show minimal  inflammatory markers, this would suggest compressive pathology.  Today's Pain Score: 6 , clinically he looks like a 2-3/10. Reported level of pain is incompatible with clinical obrservations. This may be secondary to a possible lack of understanding on how the pain scale works. Pain Type: Chronic pain Pain Descriptors / Indicators: Dull Pain Frequency: Constant  Date of Last Visit: 01/14/15 Service Provided on Last Visit: Procedure  Pharmacotherapy Review:   Side-effects or Adverse reactions: None reported Effectiveness: Described as relatively effective, allowing for increase in activities of daily living (ADL) Onset of action: Within expected pharmacological parameters Duration of action: Within normal limits for medication Peak effect: Timing and results are as within normal expected parameters [place] PMP: Compliant with practice rules and regulations UDS Results: Compliant UDS Interpretation: Patient appears to be compliant with practice rules and regulations Medication Assessment Form: Reviewed. Patient indicates being compliant with therapy Treatment compliance: Compliant Substance Use Disorder (SUD) Risk Level: Low Pharmacologic Plan: Continue therapy as is  Lab Work: Illicit Drugs Lab Results  Component Value Date   THCU NEGATIVE 12/17/2012   PCPSCRNUR NEGATIVE 12/17/2012   MDMA NEGATIVE 12/17/2012   AMPHETMU NEGATIVE 12/17/2012   METHADONE NEGATIVE 12/17/2012   ETOH < 3 11/22/2013    Inflammation Markers Lab Results  Component Value Date   ESRSEDRATE 27 04/03/2015    Renal Function Lab Results  Component Value Date   BUN 7 04/03/2015   CREATININE 0.51 04/03/2015   GFRAA >60 04/03/2015   GFRNONAA >60 04/03/2015    Hepatic Function Lab Results  Component Value Date   AST 76* 04/03/2015   ALT 42 04/03/2015   ALBUMIN 3.2* 04/03/2015    Electrolytes Lab Results  Component Value Date   NA 137 04/03/2015   K 4.1 04/03/2015   CL 104 04/03/2015   CALCIUM  8.8* 04/03/2015   MG 1.6*  04/03/2015    Procedure Assessment:   Procedure done on last visit: Diagnostic left lumbar facet block under fluoroscopic guidance and IV sedation. Side-effects or Adverse reactions: None reported Sedation: Procedure was performed with sedation  Results: Ultra-Short Term Relief (First 1 hour after procedure): 0 %  Non-physiological response to therapy Short Term Relief (Initial 4-6 hrs after procedure): 100 % Short-term relief confirms injected site to be the source of pain Long Term Relief : 20 % Long-term benefit would suggest an inflammatory etiology to the pain Current Relief (Now):  25%  No persistent benefit would suggest no long-term anti-inflammatory benefit from intervention and/or persistent or recurrent aggravating factors Interpretation of Results: The results of this diagnostic left lumbar facet block would suggest that 100% of the pain is coming from the lumbar facets, as demonstrated by the 100% relief from the local anesthetics. The lack of long-term benefit could suggest either significant remaining inflammation or a compressive-type pathology.  Allergies:  Audrey Garrett has No Known Allergies.  Meds:  The patient has a current medication list which includes the following prescription(s): clonazepam, escitalopram, furosemide, lisinopril-hydrochlorothiazide, multivitamin with minerals, oxycodone hcl, oxycodone hcl, and oxycodone hcl. Requested Prescriptions   Signed Prescriptions Disp Refills  . Oxycodone HCl 20 MG TABS 120 tablet 0    Sig: Take 1 tablet (20 mg total) by mouth every 6 (six) hours as needed.  . Oxycodone HCl 20 MG TABS 120 tablet 0    Sig: Take 1 tablet (20 mg total) by mouth every 6 (six) hours as needed.  . Oxycodone HCl 20 MG TABS 120 tablet 0    Sig: Take 1 tablet (20 mg total) by mouth every 6 (six) hours as needed.    ROS:  Constitutional: Afebrile, no chills, well hydrated and well nourished Gastrointestinal:  negative Musculoskeletal:negative Neurological: negative Behavioral/Psych: negative  PFSH:  Medical:  Audrey Garrett  has a past medical history of Depression; Anxiety; High risk medications (not anticoagulants) long-term use; Vitamin D deficiency disease; Lumbago; Hypertension; Chronic hepatitis C ([place]); Alcohol abuse; Bronchitis; Arthropathy; Back pain; Shoulder pain; Stress headaches; Hyperglycemia; SOB (shortness of breath); Muscle spasm; Edema; Cough; Polyarthritis; Fatigue; Sinusitis; RLS (restless legs syndrome); Alcoholic cirrhosis of liver without ascites ([place]); Reflux; Fibromyalgia; Spine disorder; Chronic LBP (12/25/2014); DDD (degenerative disc disease), lumbar (01/01/2015); Lumbar radicular pain (01/01/2015); Neck pain (01/01/2015); Left leg pain (01/01/2015); Sacroiliac pain (01/01/2015); and Upper back pain (01/01/2015). Family: family history includes [name] in her father; Heart disease in her mother; Stroke in her mother. Surgical:  has past surgical history that includes Tubal ligation; Tonsillectomy; and Back surgery (12/19/2011). Tobacco:  reports that she has been smoking Cigarettes.  She has a 30 pack-year smoking history. She does not have any smokeless tobacco history on file. Alcohol:  reports that she does not drink alcohol. Drug:  reports that she does not use illicit drugs.  Physical Exam:  Vitals:  Today's Vitals   04/03/15 0827 04/03/15 0828  BP: 98/44   Pulse: 71   Temp: 98.4 F (36.9 C)   TempSrc: Oral   Resp: 18   Height: 6' (1.829 m)   Weight: 154 lb (69.854 kg)   SpO2: 100%   PainSc:  6   Calculated BMI: Body mass index is 20.88 kg/(m^2). General appearance: alert, cooperative, appears stated age and no distress Eyes: PERLA Respiratory: No evidence respiratory distress, no audible rales or ronchi and no use of accessory muscles of respiration Neck: no adenopathy, no carotid bruit, no JVD, supple, symmetrical, trachea  midline and thyroid not enlarged,  symmetric, no tenderness/mass/nodules  Cervical Spine ROM: Adequate for flexion, extension, rotation, and lateral bending Palpation: No palpable trigger points  Upper Extremities ROM: Adequate bilaterally Strength: 5/5 for all flexors and extensors of the upper extremity, bilaterally Pulses: Palpable bilaterally Neurologic: No allodynia, No hyperesthesia, No hyperpathia and No sensory abnormalities  Lumbar Spine ROM: Adequate for flexion, extension, rotation, and lateral bending Palpation: No palpable trigger points Lumbar Hyperextension and rotation: Left side positive Patrick's Maneuver: Non-contributory  Lower Extremities ROM: Adequate bilaterally Strength: 5/5 for all flexors and extensors of the lower extremity, bilaterally Pulses: Palpable bilaterally Neurologic: No allodynia, No hyperesthesia, No hyperpathia, No sensory abnormalities and No antalgic gait or posture  Assessment:  Encounter Diagnosis:  Primary Diagnosis: Chronic pain [G89.29]  Plan:   Interventional Therapies: None at this time.    Heiley was seen today for back pain.  Diagnoses and all orders for this visit:  Chronic pain -     Oxycodone HCl 20 MG TABS; Take 1 tablet (20 mg total) by mouth every 6 (six) hours as needed. -     Oxycodone HCl 20 MG TABS; Take 1 tablet (20 mg total) by mouth every 6 (six) hours as needed. -     Oxycodone HCl 20 MG TABS; Take 1 tablet (20 mg total) by mouth every 6 (six) hours as needed. -     Cancel: Comprehensive metabolic panel -     C-reactive protein -     Cancel: Magnesium -     Cancel: Sedimentation rate -     Vitamin B12  Encounter for therapeutic drug level monitoring  Long term prescription opiate use -     Drugs of abuse screen w/o alc, rtn urine-sln  Opiate use  At risk for osteopenia  Chronic low back pain (Bilateral)  Osteoarthritis of spine with radiculopathy, lumbar region  Chronic lumbar radicular pain (Left)  Chronic neck pain  Chronic  lower extremity pain (Left)  Chronic sacroiliac joint pain  Chronic upper back pain  Long term current use of opiate analgesic  Encounter for chronic pain management     Patient Instructions  In[facility]  Medications discontinued today:  Medications Discontinued During This Encounter  Medication Reason  . fentaNYL (SUBLIMAZE) injection 100 mcg   . lidocaine (PF) (XYLOCAINE) 1 % injection 10 mL   . midazolam (VERSED) 5 MG/5ML injection 5 mg   . Oxycodone HCl 20 MG TABS Reorder  . Oxycodone HCl 20 MG TABS Reorder  . Oxycodone HCl 20 MG TABS Reorder  . ondansetron (ZOFRAN-ODT) 4 MG disintegrating tablet Error  . pantoprazole (PROTONIX) 40 MG tablet Error  . tiZANidine (ZANAFLEX) 4 MG tablet Error   Medications administered today:  Audrey Garrett had no medications administered during this visit.  Primary Care Physician: Kirk Ruths., MD Location: [facility] Note by: Kathlen Brunswick. Dossie Arbour, M.D, DABA, DABAPM, DABPM, DABIPP, FIPP

## 2015-04-04 ENCOUNTER — Encounter: Payer: Self-pay | Admitting: *Deleted

## 2015-04-07 ENCOUNTER — Other Ambulatory Visit: Payer: Self-pay | Admitting: Pain Medicine

## 2015-04-07 ENCOUNTER — Encounter: Admission: RE | Payer: Self-pay | Source: Ambulatory Visit

## 2015-04-07 ENCOUNTER — Ambulatory Visit: Admission: RE | Admit: 2015-04-07 | Payer: Medicare Other | Source: Ambulatory Visit | Admitting: Gastroenterology

## 2015-04-07 SURGERY — EGD (ESOPHAGOGASTRODUODENOSCOPY)
Anesthesia: General

## 2015-04-07 MED ORDER — MAGNESIUM OXIDE -MG SUPPLEMENT 500 MG PO CAPS: 1.0000 | ORAL_CAPSULE | Freq: Two times a day (BID) | ORAL | Status: DC | PRN

## 2015-04-11 LAB — TOXASSURE SELECT 13 (MW), URINE: PDF: 0

## 2015-04-14 ENCOUNTER — Ambulatory Visit: Payer: Medicare Other | Admitting: Anesthesiology

## 2015-04-14 ENCOUNTER — Encounter: Payer: Self-pay | Admitting: *Deleted

## 2015-04-14 ENCOUNTER — Telehealth: Payer: Self-pay

## 2015-04-14 ENCOUNTER — Encounter
Admission: RE | Disposition: A | Discharge status: Home / Self Care | Payer: Self-pay | Source: Ambulatory Visit | Attending: Gastroenterology

## 2015-04-14 ENCOUNTER — Ambulatory Visit
Admission: RE | Admit: 2015-04-14 | Discharge: 2015-04-14 | Disposition: A | Discharge status: Home / Self Care | Payer: Medicare Other | Source: Ambulatory Visit | Attending: Gastroenterology | Admitting: Gastroenterology

## 2015-04-14 DIAGNOSIS — K2960 Other gastritis without bleeding: Secondary | ICD-10-CM | POA: Diagnosis not present

## 2015-04-14 DIAGNOSIS — I10 Essential (primary) hypertension: Secondary | ICD-10-CM | POA: Diagnosis not present

## 2015-04-14 DIAGNOSIS — E559 Vitamin D deficiency, unspecified: Secondary | ICD-10-CM | POA: Diagnosis not present

## 2015-04-14 DIAGNOSIS — K2970 Gastritis, unspecified, without bleeding: Secondary | ICD-10-CM | POA: Insufficient documentation

## 2015-04-14 DIAGNOSIS — G2581 Restless legs syndrome: Secondary | ICD-10-CM | POA: Insufficient documentation

## 2015-04-14 DIAGNOSIS — G8929 Other chronic pain: Secondary | ICD-10-CM | POA: Diagnosis not present

## 2015-04-14 DIAGNOSIS — F419 Anxiety disorder, unspecified: Secondary | ICD-10-CM | POA: Diagnosis not present

## 2015-04-14 DIAGNOSIS — K7030 Alcoholic cirrhosis of liver without ascites: Secondary | ICD-10-CM | POA: Diagnosis not present

## 2015-04-14 DIAGNOSIS — K921 Melena: Secondary | ICD-10-CM | POA: Insufficient documentation

## 2015-04-14 DIAGNOSIS — K449 Diaphragmatic hernia without obstruction or gangrene: Secondary | ICD-10-CM | POA: Diagnosis not present

## 2015-04-14 DIAGNOSIS — Z79899 Other long term (current) drug therapy: Secondary | ICD-10-CM | POA: Diagnosis not present

## 2015-04-14 DIAGNOSIS — G709 Myoneural disorder, unspecified: Secondary | ICD-10-CM | POA: Diagnosis not present

## 2015-04-14 DIAGNOSIS — K219 Gastro-esophageal reflux disease without esophagitis: Secondary | ICD-10-CM | POA: Insufficient documentation

## 2015-04-14 DIAGNOSIS — K319 Disease of stomach and duodenum, unspecified: Secondary | ICD-10-CM | POA: Insufficient documentation

## 2015-04-14 DIAGNOSIS — M797 Fibromyalgia: Secondary | ICD-10-CM | POA: Diagnosis not present

## 2015-04-14 DIAGNOSIS — M545 Low back pain: Secondary | ICD-10-CM | POA: Insufficient documentation

## 2015-04-14 DIAGNOSIS — F329 Major depressive disorder, single episode, unspecified: Secondary | ICD-10-CM | POA: Diagnosis not present

## 2015-04-14 DIAGNOSIS — B182 Chronic viral hepatitis C: Secondary | ICD-10-CM | POA: Insufficient documentation

## 2015-04-14 DIAGNOSIS — Q394 Esophageal web: Secondary | ICD-10-CM | POA: Diagnosis not present

## 2015-04-14 HISTORY — PX: ESOPHAGOGASTRODUODENOSCOPY: SHX5428

## 2015-04-14 LAB — CBC
HCT: 31 % — ABNORMAL LOW (ref 35.0–47.0)
Hemoglobin: 10.3 g/dL — ABNORMAL LOW (ref 12.0–16.0)
MCH: 37 pg — ABNORMAL HIGH (ref 26.0–34.0)
MCHC: 33.2 g/dL (ref 32.0–36.0)
MCV: 111.3 fL — ABNORMAL HIGH (ref 80.0–100.0)
Platelets: 113 10*3/uL — ABNORMAL LOW (ref 150–440)
RBC: 2.78 MIL/uL — ABNORMAL LOW (ref 3.80–5.20)
RDW: 14.1 % (ref 11.5–14.5)
WBC: 5.4 10*3/uL (ref 3.6–11.0)

## 2015-04-14 LAB — PROTIME-INR
INR: 1.48
Prothrombin Time: 18 seconds — ABNORMAL HIGH (ref 11.4–15.0)

## 2015-04-14 SURGERY — EGD (ESOPHAGOGASTRODUODENOSCOPY)
Anesthesia: General

## 2015-04-14 MED ORDER — FENTANYL CITRATE (PF) 100 MCG/2ML IJ SOLN
INTRAMUSCULAR | Status: DC | PRN
  Administered 2015-04-14 (×2): 50 ug via INTRAVENOUS

## 2015-04-14 MED ORDER — PROPOFOL 500 MG/50ML IV EMUL
INTRAVENOUS | Status: DC | PRN
  Administered 2015-04-14: 140 ug/kg/min via INTRAVENOUS

## 2015-04-14 MED ORDER — SODIUM CHLORIDE 0.9 % IV SOLN: INTRAVENOUS | Status: DC

## 2015-04-14 MED ORDER — GLYCOPYRROLATE 0.2 MG/ML IJ SOLN
INTRAMUSCULAR | Status: DC | PRN
  Administered 2015-04-14: 0.1 mg via INTRAVENOUS

## 2015-04-14 MED ORDER — SODIUM CHLORIDE 0.9 % IV SOLN
INTRAVENOUS | Status: DC
  Administered 2015-04-14: 14:00:00 via INTRAVENOUS

## 2015-04-14 MED ORDER — MIDAZOLAM HCL 2 MG/2ML IJ SOLN
INTRAMUSCULAR | Status: DC | PRN
  Administered 2015-04-14: 2 mg via INTRAVENOUS

## 2015-04-14 MED ORDER — LIDOCAINE HCL (CARDIAC) 20 MG/ML IV SOLN
INTRAVENOUS | Status: DC | PRN
  Administered 2015-04-14: 30 mg via INTRAVENOUS

## 2015-04-14 NOTE — Anesthesia Procedure Notes (Signed)
Performed by: Vaughan Sine Pre-anesthesia Checklist: Patient identified, Emergency Drugs available, Suction available and Patient being monitored Patient Re-evaluated:Patient Re-evaluated prior to inductionOxygen Delivery Method: Nasal cannula Preoxygenation: Pre-oxygenation with 100% oxygen Intubation Type: IV induction Airway Equipment and Method: Bite block Placement Confirmation: CO2 detector and positive ETCO2

## 2015-04-14 NOTE — Anesthesia Preprocedure Evaluation (Addendum)
Anesthesia Evaluation  Patient identified by MRN, date of birth, ID band Patient awake    Reviewed: Allergy & Precautions, H&P , NPO status , [name] Chart, lab work & pertinent test results, reviewed documented beta blocker date and time   History of Anesthesia Complications Negative for: history of anesthetic complications  Airway Mallampati: I  TM Distance: >3 FB Neck ROM: full    Dental no notable dental hx. (+) Edentulous Upper, Edentulous Lower, Upper Dentures, Lower Dentures   Pulmonary neg shortness of breath, neg sleep apnea, neg COPD, neg recent URI, Current Smoker,    Pulmonary exam normal breath sounds clear to auscultation       Cardiovascular Exercise Tolerance: Good hypertension, (-) angina(-) CAD, (-) Past MI, (-) Cardiac Stents and (-) CABG Normal cardiovascular exam(-) dysrhythmias (-) Valvular Problems/Murmurs Rhythm:regular Rate:Normal     Neuro/Psych neg Seizures PSYCHIATRIC DISORDERS (Depression and anxiety)  Neuromuscular disease (fibromyalgia)    GI/Hepatic negative GI ROS, (+) Cirrhosis   ascites  substance abuse  alcohol use, Hepatitis -, C  Endo/Other  negative endocrine ROS  Renal/GU negative Renal ROS  negative genitourinary   Musculoskeletal   Abdominal   Peds  Hematology negative hematology ROS (+)   Anesthesia Other Findings Past Medical History:   Depression                                                   Anxiety                                                      High risk medications (not anticoagulants) lon*              Vitamin D deficiency disease                                 Lumbago                                                      Hypertension                                                 Chronic hepatitis C (HCC)                                    Alcohol abuse                                                Bronchitis  Arthropathy                                                  Back pain                                                    Shoulder pain                                                Stress headaches                                             Hyperglycemia                                                SOB (shortness of breath)                                    Muscle spasm                                                 Edema                                                        Cough                                                        Polyarthritis                                                Fatigue                                                      Sinusitis  RLS (restless legs syndrome)                                 Alcoholic cirrhosis of liver without ascites (*              Reflux                                                       Fibromyalgia                                                 Spine disorder                                               Chronic LBP                                     12/25/2014      Comment:Overview:  Post extensive surgery    DDD (degenerative disc disease), lumbar         01/01/2015    Lumbar radicular pain                           01/01/2015    Neck pain                                       01/01/2015    Left leg pain                                   01/01/2015    Sacroiliac pain                                 01/01/2015    Upper back pain                                 01/01/2015    Reproductive/Obstetrics negative OB ROS                            Anesthesia Physical Anesthesia Plan  ASA: III  Anesthesia Plan: General   Post-op Pain Management:    Induction:   Airway Management Planned:   Additional Equipment:   Intra-op Plan:   Post-operative Plan:   Informed Consent: I have reviewed the patients History and Physical, chart, labs and  discussed the procedure including the risks, benefits and alternatives for the proposed anesthesia with the patient or authorized representative who has indicated his/her understanding and acceptance.   Dental Advisory Given  Plan Discussed with: Anesthesiologist, CRNA and Surgeon  Anesthesia Plan Comments:  Anesthesia Quick Evaluation  

## 2015-04-14 NOTE — Transfer of Care (Signed)
Immediate Anesthesia Transfer of Care Note  Patient: Audrey Garrett  Procedure(s) Performed: Procedure(s): ESOPHAGOGASTRODUODENOSCOPY (EGD) (N/A)  Patient Location: PACU  Anesthesia Type:General  Level of Consciousness: sedated  Airway & Oxygen Therapy: Patient Spontanous Breathing and Patient connected to nasal cannula oxygen  Post-op Assessment: Report given to RN and Post -op Vital signs reviewed and stable  Post vital signs: Reviewed and stable  Last Vitals:  Filed Vitals:   04/14/15 1339  BP: 102/60  Pulse: 66  Temp: 37.9 C  Resp: 18    Complications: No apparent anesthesia complications

## 2015-04-14 NOTE — H&P (Signed)
Outpatient short stay form Pre-procedure 04/14/2015 3:08 PM Audrey Sails MD  Primary Physician: Dr. Frazier Richards  Reason for visit:  EGD  History of present illness:  Patient is a 55 year old female my seen in the past and a day for EGD. She states that she has been seeing black bowel movements and having nausea for several weeks. She was seen in the outpatient clinic a little over week ago. Multiple globin strong last week that were stable. He was started back on a proton pump inhibitor) see she was previously on this agent) as well as some Aldactone. Also placed on Xifaxan. He has a history of cirrhosis of the liver secondary to alcohol abuse as well as hepatitis C.    Current facility-administered medications:  .  0.9 %  sodium chloride infusion, , Intravenous, Continuous, Audrey Sails, MD, Last Rate: 20 mL/hr at 04/14/15 1420 .  0.9 %  sodium chloride infusion, , Intravenous, Continuous, Audrey Sails, MD  Prescriptions prior to admission  Medication Sig Dispense Refill Last Dose  . clonazePAM (KLONOPIN) 1 MG tablet Take 1 mg by mouth daily.    Taking  . escitalopram (LEXAPRO) 20 MG tablet Take 20 mg by mouth daily.    Taking  . furosemide (LASIX) 40 MG tablet Take 40 mg by mouth daily.    Taking  . lisinopril-hydrochlorothiazide (PRINZIDE,ZESTORETIC) 20-12.5 MG tablet Take 1 tablet by mouth daily.   Taking  . Magnesium Oxide 500 MG CAPS Take 1 capsule (500 mg total) by mouth 2 (two) times daily as needed. 60 capsule PRN   . Multiple Vitamins-Minerals (MULTIVITAMIN WITH MINERALS) tablet Take 1 tablet by mouth daily.   Taking  . Oxycodone HCl 20 MG TABS Take 1 tablet (20 mg total) by mouth every 6 (six) hours as needed. 120 tablet 0   . Oxycodone HCl 20 MG TABS Take 1 tablet (20 mg total) by mouth every 6 (six) hours as needed. 120 tablet 0   . Oxycodone HCl 20 MG TABS Take 1 tablet (20 mg total) by mouth every 6 (six) hours as needed. 120 tablet 0      No Known  Allergies   Past Medical History  Diagnosis Date  . Depression   . Anxiety   . High risk medications (not anticoagulants) long-term use   . Vitamin D deficiency disease   . Lumbago   . Hypertension   . Chronic hepatitis C ([place])   . Alcohol abuse   . Bronchitis   . Arthropathy   . Back pain   . Shoulder pain   . Stress headaches   . Hyperglycemia   . SOB (shortness of breath)   . Muscle spasm   . Edema   . Cough   . Polyarthritis   . Fatigue   . Sinusitis   . RLS (restless legs syndrome)   . Alcoholic cirrhosis of liver without ascites ([place])   . Reflux   . Fibromyalgia   . Spine disorder   . Chronic LBP 12/25/2014    Overview:  Post extensive surgery   . DDD (degenerative disc disease), lumbar 01/01/2015  . Lumbar radicular pain 01/01/2015  . Neck pain 01/01/2015  . Left leg pain 01/01/2015  . Sacroiliac pain 01/01/2015  . Upper back pain 01/01/2015    Review of systems:      Physical Exam    Heart and lungs: Regular rate and rhythm without rub or gallop, lungs are bilaterally clear.    HEENT: Normocephalic  atraumatic eyes are anicteric    Other:     Pertinant exam for procedure: Multiple spider telangiectasias over the upper chest and malar surfaces. Soft mild discomfort palpation the epigastric region. Bowel sounds positive normoactive. No mass or rebound.    Planned proceedures: EGD and indicated procedures. I have discussed the risks benefits and complications of procedures to include not limited to bleeding, infection, perforation and the risk of sedation and the patient wishes to proceed. Pro time and platelet count were checked prior to the procedure. The count was 113 pro time was elevated with an INR of 1.48.    Audrey Sails, MD Gastroenterology 04/14/2015  3:08 PM

## 2015-04-14 NOTE — Telephone Encounter (Signed)
Pt has not been able to get pain meds since Friday pt needs prior approval from insurance so that pharmacy will fill scripts

## 2015-04-14 NOTE — Op Note (Signed)
[facility] Patient Name: Audrey Garrett Procedure Date: 04/14/2015 2:12 PM MRN: [id number] Account #: [id number] Date of Birth: 07/02/1960 Admit Type: Outpatient Age: 55 Room: [facility] ENDO ROOM 3 Gender: Female Note Status: Finalized Procedure:         Upper GI endoscopy Indications:       Heme positive stool, Melena Providers:         Lollie Sails, MD Referring MD:      Ocie Cornfield. Ouida Sills, MD (Referring MD) Medicines:         Monitored Anesthesia Care Complications:     No immediate complications. Procedure:         Pre-Anesthesia Assessment:                    - ASA Grade Assessment: III - A patient with severe                     systemic disease.                    After obtaining informed consent, the endoscope was passed                     under direct vision. Throughout the procedure, the                     patient's blood pressure, pulse, and oxygen saturations                     were monitored continuously. The Endoscope was introduced                     through the mouth, and advanced to the third part of                     duodenum. The upper GI endoscopy was accomplished without                     difficulty. The patient tolerated the procedure well. 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 The scope was noted to pass this       easily.      The exam of the esophagus was otherwise normal. No evidence of       esophageal varices.      The exam of the esophagus was otherwise normal.      Diffuse and patchy mild inflammation characterized by congestion (edema)       and erythema was found in the gastric body and in the gastric antrum.      A single localized, medium-sized non-bleeding erosion was found in the       prepyloric region of the stomach. There were no stigmata of recent       bleeding.      A small hiatus hernia was present.      The examined duodenum was normal.      On removal of the scope it was  noted that the ring noted on introduction       of the scope had opened with a small amount of blood noted, but a second       look showed no active bleeding.      possibly very early grade 1 varices deep to the surface without evidence       of complication.  Mild portal hypertensive gastropathy was found in the gastric body. 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                    - Erosive gastritis.                    - Non-bleeding erosive gastropathy.                    - Small hiatus hernia.                    - Normal examined duodenum.                    - No specimens collected. Recommendation:    - Discharge patient to home.                    - Use Protonix (pantoprazole) 40 mg PO BID daily.                    - Use sucralfate suspension 1 gram PO QID daily. Procedure Code(s): --- Professional ---                    [phone], Esophagogastroduodenoscopy, flexible, transoral;                     diagnostic, including collection of specimen(s) by                     brushing or washing, when performed (separate procedure) Diagnosis Code(s): --- Professional ---                    Q39.4, Esophageal web                    K29.60, Other gastritis without bleeding                    K31.89, Other diseases of stomach and duodenum                    K44.9, Diaphragmatic hernia without obstruction or gangrene                    R19.5, Other fecal abnormalities                    K92.1, Melena CPT copyright 2014 [employer] Medical Association. All rights reserved. The codes documented in this report are preliminary and upon coder review may  be revised to meet current compliance requirements. Lollie Sails, MD 04/14/2015 3:18:11 PM This report has been signed electronically. Number of Addenda: 0 Note Initiated On: 04/14/2015 2:12 PM      [facility]

## 2015-04-15 NOTE — Telephone Encounter (Signed)
Pts husband called again and he would like for someone to figure out why his wife can't get her scripts filled. He would like someone to call him back ASAP

## 2015-04-15 NOTE — Telephone Encounter (Signed)
Patient says she brought a paper in about medications at her visit /  Nurse told her she didn't have to worry about that and she threw it away, so the med she needs is oxycontin please call pharmacy to see if she needs prior auth from insurance or ok to fill from dr Dossie Arbour

## 2015-04-15 NOTE — Progress Notes (Signed)
Patient very upset with pain management concerning refilling pain medicine.  Otherwise post procedure in endo ok.

## 2015-04-16 ENCOUNTER — Encounter: Payer: Self-pay | Admitting: Gastroenterology

## 2015-04-16 NOTE — Anesthesia Postprocedure Evaluation (Signed)
Anesthesia Post Note  Patient: Audrey Garrett  Procedure(s) Performed: Procedure(s) (LRB): ESOPHAGOGASTRODUODENOSCOPY (EGD) (N/A)  Patient location during evaluation: Endoscopy Anesthesia Type: General Level of consciousness: awake and alert Pain management: pain level controlled Vital Signs Assessment: post-procedure vital signs reviewed and stable Respiratory status: spontaneous breathing, nonlabored ventilation, respiratory function stable and patient connected to nasal cannula oxygen Cardiovascular status: blood pressure returned to baseline and stable Postop Assessment: no signs of nausea or vomiting Anesthetic complications: no    Last Vitals:  Filed Vitals:   04/14/15 1522 04/14/15 1532  BP: 109/71 115/70  Pulse: 78 73  Temp:    Resp: 12 18    Last Pain:  Filed Vitals:   04/14/15 1535  PainSc: 8                  Martha Clan

## 2015-05-06 ENCOUNTER — Other Ambulatory Visit: Payer: Self-pay | Admitting: Gastroenterology

## 2015-05-06 ENCOUNTER — Ambulatory Visit
Admission: RE | Admit: 2015-05-06 | Discharge: 2015-05-06 | Disposition: A | Discharge status: Home / Self Care | Payer: Medicare Other | Source: Ambulatory Visit | Attending: Gastroenterology | Admitting: Gastroenterology

## 2015-05-06 DIAGNOSIS — R1084 Generalized abdominal pain: Secondary | ICD-10-CM

## 2015-05-06 DIAGNOSIS — K8020 Calculus of gallbladder without cholecystitis without obstruction: Secondary | ICD-10-CM | POA: Insufficient documentation

## 2015-05-06 DIAGNOSIS — K7031 Alcoholic cirrhosis of liver with ascites: Secondary | ICD-10-CM

## 2015-05-06 DIAGNOSIS — R188 Other ascites: Secondary | ICD-10-CM | POA: Insufficient documentation

## 2015-05-06 DIAGNOSIS — K7460 Unspecified cirrhosis of liver: Secondary | ICD-10-CM | POA: Diagnosis not present

## 2015-05-06 DIAGNOSIS — R1032 Left lower quadrant pain: Secondary | ICD-10-CM | POA: Diagnosis present

## 2015-05-06 DIAGNOSIS — R1031 Right lower quadrant pain: Secondary | ICD-10-CM | POA: Diagnosis present

## 2015-05-07 ENCOUNTER — Emergency Department
Admission: EM | Admit: 2015-05-07 | Discharge: 2015-05-07 | Disposition: A | Discharge status: Home / Self Care | Payer: Medicare Other | Attending: Emergency Medicine | Admitting: Emergency Medicine

## 2015-05-07 ENCOUNTER — Encounter: Payer: Self-pay | Admitting: Emergency Medicine

## 2015-05-07 DIAGNOSIS — I10 Essential (primary) hypertension: Secondary | ICD-10-CM | POA: Insufficient documentation

## 2015-05-07 DIAGNOSIS — F17210 Nicotine dependence, cigarettes, uncomplicated: Secondary | ICD-10-CM | POA: Diagnosis not present

## 2015-05-07 DIAGNOSIS — K7031 Alcoholic cirrhosis of liver with ascites: Secondary | ICD-10-CM | POA: Insufficient documentation

## 2015-05-07 DIAGNOSIS — B182 Chronic viral hepatitis C: Secondary | ICD-10-CM | POA: Insufficient documentation

## 2015-05-07 DIAGNOSIS — K7460 Unspecified cirrhosis of liver: Secondary | ICD-10-CM

## 2015-05-07 DIAGNOSIS — R109 Unspecified abdominal pain: Secondary | ICD-10-CM | POA: Diagnosis present

## 2015-05-07 DIAGNOSIS — R188 Other ascites: Secondary | ICD-10-CM

## 2015-05-07 DIAGNOSIS — D649 Anemia, unspecified: Secondary | ICD-10-CM | POA: Diagnosis not present

## 2015-05-07 LAB — CBC WITH DIFFERENTIAL/PLATELET
Basophils Absolute: 0 10*3/uL (ref 0–0.1)
Basophils Relative: 1 %
Eosinophils Absolute: 0.1 10*3/uL (ref 0–0.7)
Eosinophils Relative: 2 %
HCT: 22.8 % — ABNORMAL LOW (ref 35.0–47.0)
Hemoglobin: 7.4 g/dL — ABNORMAL LOW (ref 12.0–16.0)
Lymphocytes Relative: 21 %
Lymphs Abs: 1 10*3/uL (ref 1.0–3.6)
MCH: 31.9 pg (ref 26.0–34.0)
MCHC: 32.4 g/dL (ref 32.0–36.0)
MCV: 98.4 fL (ref 80.0–100.0)
Monocytes Absolute: 0.7 10*3/uL (ref 0.2–0.9)
Monocytes Relative: 15 %
Neutro Abs: 3 10*3/uL (ref 1.4–6.5)
Neutrophils Relative %: 61 %
Platelets: 123 10*3/uL — ABNORMAL LOW (ref 150–440)
RBC: 2.31 MIL/uL — ABNORMAL LOW (ref 3.80–5.20)
RDW: 18 % — ABNORMAL HIGH (ref 11.5–14.5)
WBC: 4.9 10*3/uL (ref 3.6–11.0)

## 2015-05-07 LAB — COMPREHENSIVE METABOLIC PANEL
ALT: 30 U/L (ref 14–54)
AST: 61 U/L — ABNORMAL HIGH (ref 15–41)
Albumin: 2.8 g/dL — ABNORMAL LOW (ref 3.5–5.0)
Alkaline Phosphatase: 67 U/L (ref 38–126)
Anion gap: 6 (ref 5–15)
BUN: 6 mg/dL (ref 6–20)
CO2: 24 mmol/L (ref 22–32)
Calcium: 7.9 mg/dL — ABNORMAL LOW (ref 8.9–10.3)
Chloride: 98 mmol/L — ABNORMAL LOW (ref 101–111)
Creatinine, Ser: 0.55 mg/dL (ref 0.44–1.00)
GFR calc Af Amer: >60 mL/min (ref >60)
GFR calc non Af Amer: >60 mL/min (ref >60)
Glucose, Bld: 168 mg/dL — ABNORMAL HIGH (ref 65–99)
Potassium: 3.8 mmol/L (ref 3.5–5.1)
Sodium: 128 mmol/L — ABNORMAL LOW (ref 135–145)
Total Bilirubin: 0.9 mg/dL (ref 0.3–1.2)
Total Protein: 6.4 g/dL — ABNORMAL LOW (ref 6.5–8.1)

## 2015-05-07 LAB — TROPONIN I: Troponin I: <0.03 ng/mL (ref <0.031)

## 2015-05-07 LAB — ABO/RH: ABO/RH(D): O POS

## 2015-05-07 LAB — PROTIME-INR
INR: 1.54
Prothrombin Time: 18.5 seconds — ABNORMAL HIGH (ref 11.4–15.0)

## 2015-05-07 LAB — PREPARE RBC (CROSSMATCH)

## 2015-05-07 MED ORDER — SODIUM CHLORIDE 0.9 % IV SOLN
10.0000 mL/h | Freq: Once | INTRAVENOUS | Status: AC
  Administered 2015-05-07: 10 mL/h via INTRAVENOUS

## 2015-05-07 NOTE — Discharge Instructions (Signed)
Blood Transfusion, Care After Refer to this sheet in the next few weeks. These instructions provide you with information about caring for yourself after your procedure. Your health care provider may also give you more specific instructions. Your treatment has been planned according to current medical practices, but problems sometimes occur. Call your health care provider if you have any problems or questions after your procedure. WHAT TO EXPECT AFTER THE PROCEDURE After your procedure, it is common to have:  Bruising and soreness at the IV site.  Chills or fever.  Headache. HOME CARE INSTRUCTIONS  Take medicines only as directed by your health care provider. Ask your health care provider if you can take an over-the-counter pain reliever in case you have a fever or headache a day or two after your transfusion.  Return to your normal activities as directed by your health care provider. SEEK MEDICAL CARE IF:   You develop redness or irritation at your IV site.  You have persistent fever, chills, or headache.  Your urine is darker than normal.  Your urine turns pink, red, or brown.   The white part of your eye turns yellow (jaundice).   You feel weak after doing your normal activities.  SEEK IMMEDIATE MEDICAL CARE IF:   You have trouble breathing.  You have fever and chills along with:  Anxiety.  Chest or back pain.  Flushed skin.  Clammy skin.  A rapid heartbeat.  Nausea.   This information is not intended to replace advice given to you by your health care provider. Make sure you discuss any questions you have with your health care provider.   Document Released: 04/05/2014 Document Reviewed: 04/05/2014 Elsevier Interactive Patient Education 2016 [employer].  Anemia, Nonspecific Anemia is a condition in which the concentration of red blood cells or hemoglobin in the blood is below normal. Hemoglobin is a substance in red blood cells that carries oxygen to the  tissues of the body. Anemia results in not enough oxygen reaching these tissues.  CAUSES  Common causes of anemia include:   Excessive bleeding. Bleeding may be internal or external. This includes excessive bleeding from periods (in women) or from the intestine.   Poor nutrition.   Chronic kidney, thyroid, and liver disease.  Bone marrow disorders that decrease red blood cell production.  Cancer and treatments for cancer.  HIV, AIDS, and their treatments.  Spleen problems that increase red blood cell destruction.  Blood disorders.  Excess destruction of red blood cells due to infection, medicines, and autoimmune disorders. SIGNS AND SYMPTOMS   Minor weakness.   Dizziness.   Headache.  Palpitations.   Shortness of breath, especially with exercise.   Paleness.  Cold sensitivity.  Indigestion.  Nausea.  Difficulty sleeping.  Difficulty concentrating. Symptoms may occur suddenly or they may develop slowly.  DIAGNOSIS  Additional blood tests are often needed. These help your health care provider determine the best treatment. Your health care provider will check your stool for blood and look for other causes of blood loss.  TREATMENT  Treatment varies depending on the cause of the anemia. Treatment can include:   Supplements of iron, vitamin E52, or folic acid.   Hormone medicines.   A blood transfusion. This may be needed if blood loss is severe.   Hospitalization. This may be needed if there is significant continual blood loss.   Dietary changes.  Spleen removal. HOME CARE INSTRUCTIONS Keep all follow-up appointments. It often takes many weeks to correct anemia, and having your  health care provider check on your condition and your response to treatment is very important. SEEK IMMEDIATE MEDICAL CARE IF:   You develop extreme weakness, shortness of breath, or chest pain.   You become dizzy or have trouble concentrating.  You develop heavy  vaginal bleeding.   You develop a rash.   You have bloody or black, tarry stools.   You faint.   You vomit up blood.   You vomit repeatedly.   You have abdominal pain.  You have a fever or persistent symptoms for more than 2-3 days.   You have a fever and your symptoms suddenly get worse.   You are dehydrated.  MAKE SURE YOU:  Understand these instructions.  Will watch your condition.  Will get help right away if you are not doing well or get worse.   This information is not intended to replace advice given to you by your health care provider. Make sure you discuss any questions you have with your health care provider.   Document Released: 04/22/2004 Document Revised: 11/15/2012 Document Reviewed: 09/08/2012 Elsevier Interactive Patient Education [employer].

## 2015-05-07 NOTE — ED Notes (Signed)
Pt to ed with c/o swelling in abd and lower legs x several months.  Pt was sent here from [place] office for low hemoglobin. Pt with hx of cirrhosis.

## 2015-05-07 NOTE — ED Provider Notes (Addendum)
[facility] Emergency Department Provider Note     Time seen: ----------------------------------------- 8:52 AM on 05/07/2015 -----------------------------------------    I have reviewed the triage vital signs and the nursing notes.   HISTORY  Chief Complaint Abdominal Pain    HPI Audrey Garrett is a 55 y.o. female who presents ER with abdominal swelling and leg swelling for the past several months. She was sent here from her primary care doctor's office for low hemoglobin.She reportedly had an ultrasound performed yesterday as well. She denies fevers, chills, nausea vomiting or diarrhea. She has never had a paracentesis in the past   Past Medical History  Diagnosis Date  . Depression   . Anxiety   . High risk medications (not anticoagulants) long-term use   . Vitamin D deficiency disease   . Lumbago   . Hypertension   . Chronic hepatitis C ([place])   . Alcohol abuse   . Bronchitis   . Arthropathy   . Back pain   . Shoulder pain   . Stress headaches   . Hyperglycemia   . SOB (shortness of breath)   . Muscle spasm   . Edema   . Cough   . Polyarthritis   . Fatigue   . Sinusitis   . RLS (restless legs syndrome)   . Alcoholic cirrhosis of liver without ascites ([place])   . Reflux   . Fibromyalgia   . Spine disorder   . Chronic LBP 12/25/2014    Overview:  Post extensive surgery   . DDD (degenerative disc disease), lumbar 01/01/2015  . Lumbar radicular pain 01/01/2015  . Neck pain 01/01/2015  . Left leg pain 01/01/2015  . Sacroiliac pain 01/01/2015  . Upper back pain 01/01/2015    Patient Active Problem List   Diagnosis Date Noted  . Hypomagnesemia 04/07/2015  . Chronic pain 04/03/2015  . At risk for osteopenia 04/03/2015  . Chronic low back pain (Bilateral) 04/03/2015  . Lumbar spondylosis 04/03/2015  . Chronic lumbar radicular pain (Left) 04/03/2015  . Chronic neck pain 04/03/2015  . Chronic lower extremity pain (Left) 04/03/2015  . Chronic  sacroiliac joint pain 04/03/2015  . Chronic upper back pain 04/03/2015  . Long term current use of opiate analgesic 04/03/2015  . Encounter for chronic pain management 04/03/2015  . Bilateral low back pain with left-sided sciatica 01/01/2015  . Chronic pain syndrome 01/01/2015  . Cervical radicular pain 01/01/2015  . Other long term (current) drug therapy 01/01/2015  . Uncomplicated opioid dependence ([place]) 01/01/2015  . Long term prescription opiate use 01/01/2015  . Lumbar facet arthropathy 01/01/2015  . 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 Lumbar facet syndrome (Bilateral) 01/01/2015  . Epidural fibrosis 01/01/2015  . Osteoarthritis of spine with radiculopathy, lumbar region 01/01/2015  . Pain of paraspinal muscle 01/01/2015  . Low back pain with radiation 01/01/2015  . Opiate use 01/01/2015  . Encounter for therapeutic drug level monitoring 01/01/2015  . 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 Insomnia, persistent 12/25/2014  . Major depression in remission ([place]) 12/25/2014  . BP (high blood pressure) 12/25/2014  . Encounter for general adult medical examination without abnormal findings 12/25/2014  . HCV (hepatitis C virus) 12/25/2014  . ACUTE SINUSITIS, UNSPECIFIED 11/01/2009  . BRONCHITIS, ACUTE 11/01/2009    Past Surgical History  Procedure Laterality Date  . Tubal ligation    . Tonsillectomy    . Back surgery  12/19/2011  . Esophagogastroduodenoscopy N/A 04/14/2015    Procedure: ESOPHAGOGASTRODUODENOSCOPY (EGD);  Surgeon: Billie Ruddy  Gustavo Lah, MD;  Location: [facility] ENDOSCOPY;  Service: Endoscopy;  Laterality: N/A;    Allergies Review of patient's allergies indicates no known allergies.  Social History Social History  Substance Use Topics  . Smoking status: Current Every Day Smoker -- 1.00 packs/day for 30 years    Types: Cigarettes  . Smokeless tobacco: None  . Alcohol Use: 0.0 oz/week    0 Standard drinks or equivalent per week    Review of  Systems Constitutional: Negative for fever. Eyes: Negative for visual changes. ENT: Negative for sore throat. Cardiovascular: Negative for chest pain. Respiratory: Negative for shortness of breath. Gastrointestinal: Negative for abdominal pain, vomiting and diarrhea. Genitourinary: Negative for dysuria. Musculoskeletal: Negative for back pain. Positive for edema Skin: Negative for rash. Neurological: Negative for headaches, focal weakness or numbness.  10-point ROS otherwise negative.  ____________________________________________   PHYSICAL EXAM:  VITAL SIGNS: ED Triage Vitals  Enc Vitals Group     BP 05/07/15 0834 108/48 mmHg     Pulse Rate 05/07/15 0834 93     Resp 05/07/15 0834 18     Temp 05/07/15 0834 98.2 F (36.8 C)     Temp Source 05/07/15 0834 Oral     SpO2 05/07/15 0834 97 %     Weight 05/07/15 0834 175 lb (79.379 kg)     Height 05/07/15 0834 6' (1.829 m)     Head Cir --      Peak Flow --      Pain Score 05/07/15 0849 8     Pain Loc --      Pain Edu? --      Excl. in [place]? --     Constitutional: Alert and oriented. Well appearing and in no distress. Eyes: Conjunctivae are normal. PERRL. Normal extraocular movements. ENT   Head: Normocephalic and atraumatic.   Nose: No congestion/rhinnorhea.   Mouth/Throat: Mucous membranes are moist.   Neck: No stridor. Cardiovascular: Normal rate, regular rhythm. Normal and symmetric distal pulses are present in all extremities. No murmurs, rubs, or gallops. Respiratory: Normal respiratory effort without tachypnea nor retractions. Breath sounds are clear and equal bilaterally. No wheezes/rales/rhonchi. Gastrointestinal: Soft, no distention.  Ascites is noted, nontender. Rectal: No obvious hemorrhoids, heme-negative stool Musculoskeletal: Nontender with normal range of motion in all extremities. No joint effusions.  No lower extremity tenderness, bilateral pitting lower extremity edema Neurologic:  Normal speech  and language. No gross focal neurologic deficits are appreciated. Speech is normal. No gait instability. Skin:  Skin is warm, dry and intact. No rash noted. Psychiatric: Mood and affect are normal. Speech and behavior are normal. Patient exhibits appropriate insight and judgment. ____________________________________________  ED COURSE:  Pertinent labs & imaging results that were available during my care of the patient were reviewed by me and considered in my medical decision making (see chart for details). Patient is in no acute distress, will recheck labs to confirm her anemia. ____________________________________________   US performed as an outpatient yesterday IMPRESSION: A degree of ascites is noted.  Cholelithiasis and sludge within the gallbladder. The gallbladder wall is mildly thickened. Gallbladder wall thickening may be seen given associated ascites. There may also be a degree of cholecystitis given the gallbladder wall thickening in association with gallstones. A nuclear medicine hepatobiliary imaging study to assess for cystic duct patency may be of value in this circumstance.  The appearance of the liver is consistent with known cirrhosis. No focal liver lesions are appreciable by ultrasound.  Study otherwise unremarkable.   LABS (pertinent  positives/negatives)  Labs Reviewed  CBC WITH DIFFERENTIAL/PLATELET - Abnormal; Notable for the following:    RBC 2.31 (*)    Hemoglobin 7.4 (*)    HCT 22.8 (*)    RDW 18.0 (*)    Platelets 123 (*)    All other components within normal limits  COMPREHENSIVE METABOLIC PANEL  PROTIME-INR  TROPONIN I   ____________________________________________  FINAL ASSESSMENT AND PLAN  Anemia, cirrhosis  Plan: Patient with labs and imaging as dictated above. I have discussed the case with Dr. Gustavo Lah who has requested we give her a blood transfusion. She is heme negative here, and he will be following up with her as an  outpatient. She does not meet inpatient criteria at this time.   Earleen Newport, MD Patient has received blood transfusion, is stable for outpatient follow-up with Dr. Jaquita Rector.  Earleen Newport, MD 05/07/15 1107  Earleen Newport, MD 05/07/15 [phone]

## 2015-05-08 LAB — TYPE AND SCREEN
ABO/RH(D): O POS
Antibody Screen: NEGATIVE
Unit division: 0

## 2015-05-08 NOTE — Progress Notes (Signed)
Quick Note:  Normal Magnesium levels are between 1.6 and 2.3 mEq/L. Low magnesium blood level can lead to low calcium and potassium levels. Low levels may indicate inadequate dietary consuming, poor absorbtion, or excessive excretion. Signs and symptoms may include: leg cramps, foot pain, muscle twitches, loss of appetite, nausea, vomiting, fatigue, weakness, numbness, tingling, seizures, personality changes, abnormal heart rhythms, and/or coronary artery spasms.  ______

## 2015-05-08 NOTE — Progress Notes (Signed)
Quick Note:  Normal fasting (NPO x 8 hours) glucose levels are between 65-99 mg/dl, with 2 hour fasting, levels are usually less than 140 mg/dl. Any random blood glucose level greater than 200 mg/dl is considered to be Diabetes.  Normal calcium levels are between 9.0 and 10.5 mg/dl. The most common cause of low total calcium is: Low blood protein levels, especially a low level of albumin, which can result from liver disease or malnutrition, both of which may result from alcoholism or other illnesses. Low albumin is also very common in people who are acutely ill. With low albumin, only the bound calcium is low. Ionized calcium remains normal, and calcium metabolism is being regulated appropriately. Some other causes of hypocalcemia include: Underactive parathyroid gland (hypoparathyroidism); Inherited resistance to the effects of parathyroid hormone; Extreme deficiency in dietary calcium; Decreased levels of vitamin D; Magnesium deficiency; Increased levels of phosphorus; Acute inflammation of the pancreas (pancreatitis); & Renal failure.  A total serum protein test measures the total amount of protein in the blood. It also measures the amounts of two major groups of proteins in the blood: albumin and globulin.  Albumin is made mainly in the liver. It helps keep the blood from leaking out of blood vessels. Albumin also helps carry some medicines and other substances through the blood and is important for tissue growth and healing.  Globulin is made up of different proteins called alpha, beta, and gamma types. Some globulins are made by the liver, while others are made by the immune system. Certain globulins bind with hemoglobin. Other globulins transport metals, such as iron, in the blood and help fight infection. Serum globulin can be separated into several subgroups by serum protein electrophoresis. To learn more, see the topic Serum Protein Electrophoresis.  A test for total serum protein reports  separate values for total protein, albumin, and globulin. Some types of globulin (such as alpha-1 globulin) also may be measured.  Some laboratories report total protein, albumin, and the calculated ratio of albumin to globulins, termed the A/G ratio. The A/G ratio is calculated from measured total protein, measured albumin, and calculated globulin (total protein - albumin). Because disease states affect the relative amounts of albumin and globulin, the A/G ratio may provide a clue as to the cause of the change in protein levels.  A low A/G ratio may reflect overproduction of globulins, such as seen in multiple myeloma or autoimmune diseases, or underproduction of albumin, such as may occur with cirrhosis, or selective loss of albumin from the circulation, as may occur with kidney disease (nephrotic syndrome).  A high A/G ratio suggests underproduction of immunoglobulins as may be seen in some genetic deficiencies and in some leukemias. With a low total protein that is due to plasma expansion (dilution of the blood), the A/G ratio will typically be normal because both albumin and globulin will be diluted to the same extent.Normal levels of AST are between 5 and 40 U/L. Pregnancy, a muscle injection, or even strenuous exercise may increase AST levels. Acute burns, surgery, and seizures may raise AST levels as well. Very high levels of AST (> 10 X normal) are usually due to acute hepatitis. Levels > 100 X normal can be seen with liver exposure to hepatotoxic substances. Moderate increases may be seen in other diseases of the liver, especially when the bile ducts are blocked, or with cirrhosis or certain cancers of the liver. AST may also increase after heart attacks and with muscle injury, usually to a much greater  degree than ALT. In most types of liver disease, the ALT level is higher than AST and the AST/ALT ratio will be low (less than 1). With heart or muscle injury, AST is often much higher than ALT  (often 3-5 times as high) and levels tend to stay higher than ALT for longer than with liver injury.  ______

## 2015-05-08 NOTE — Progress Notes (Signed)
Quick Note:  Lab results reviewed and found to be within normal limits. ______ 

## 2015-05-08 NOTE — Progress Notes (Signed)
Quick Note:  NOTE: This forensic urine drug screen (UDS) test was conducted using a state-of-the-art ultra high performance liquid chromatography and mass spectrometry system (UPLC/MS-MS), the most sophisticated and accurate method available. UPLC/MS-MS is 1,000 times more precise and accurate than standard gas chromatography and mass spectrometry (GC/MS). This system can analyze 26 drug categories and 180 drug compounds.  The results of this test came back with unexpected findings. Unreported tramadol found on the sample. ______

## 2015-05-19 DIAGNOSIS — K7460 Unspecified cirrhosis of liver: Secondary | ICD-10-CM | POA: Insufficient documentation

## 2015-05-19 DIAGNOSIS — R188 Other ascites: Secondary | ICD-10-CM | POA: Insufficient documentation

## 2015-05-21 ENCOUNTER — Observation Stay
Admission: EM | Admit: 2015-05-21 | Discharge: 2015-05-22 | Disposition: A | Discharge status: Home / Self Care | Payer: Medicare Other | Attending: Internal Medicine | Admitting: Internal Medicine

## 2015-05-21 ENCOUNTER — Ambulatory Visit: Payer: Self-pay | Admitting: Urology

## 2015-05-21 ENCOUNTER — Emergency Department: Payer: Medicare Other

## 2015-05-21 ENCOUNTER — Observation Stay: Payer: Medicare Other

## 2015-05-21 ENCOUNTER — Encounter: Payer: Self-pay | Admitting: Urology

## 2015-05-21 ENCOUNTER — Encounter: Payer: Self-pay | Admitting: Emergency Medicine

## 2015-05-21 DIAGNOSIS — Z79891 Long term (current) use of opiate analgesic: Secondary | ICD-10-CM | POA: Diagnosis not present

## 2015-05-21 DIAGNOSIS — M129 Arthropathy, unspecified: Secondary | ICD-10-CM | POA: Insufficient documentation

## 2015-05-21 DIAGNOSIS — K219 Gastro-esophageal reflux disease without esophagitis: Secondary | ICD-10-CM | POA: Insufficient documentation

## 2015-05-21 DIAGNOSIS — M79605 Pain in left leg: Secondary | ICD-10-CM | POA: Diagnosis not present

## 2015-05-21 DIAGNOSIS — K7031 Alcoholic cirrhosis of liver with ascites: Secondary | ICD-10-CM | POA: Diagnosis not present

## 2015-05-21 DIAGNOSIS — F329 Major depressive disorder, single episode, unspecified: Secondary | ICD-10-CM | POA: Diagnosis not present

## 2015-05-21 DIAGNOSIS — D689 Coagulation defect, unspecified: Secondary | ICD-10-CM

## 2015-05-21 DIAGNOSIS — D696 Thrombocytopenia, unspecified: Secondary | ICD-10-CM | POA: Diagnosis not present

## 2015-05-21 DIAGNOSIS — J90 Pleural effusion, not elsewhere classified: Secondary | ICD-10-CM | POA: Insufficient documentation

## 2015-05-21 DIAGNOSIS — M545 Low back pain: Secondary | ICD-10-CM | POA: Diagnosis not present

## 2015-05-21 DIAGNOSIS — D509 Iron deficiency anemia, unspecified: Secondary | ICD-10-CM | POA: Insufficient documentation

## 2015-05-21 DIAGNOSIS — M797 Fibromyalgia: Secondary | ICD-10-CM | POA: Diagnosis not present

## 2015-05-21 DIAGNOSIS — R140 Abdominal distension (gaseous): Secondary | ICD-10-CM | POA: Diagnosis present

## 2015-05-21 DIAGNOSIS — R739 Hyperglycemia, unspecified: Secondary | ICD-10-CM

## 2015-05-21 DIAGNOSIS — R0602 Shortness of breath: Secondary | ICD-10-CM | POA: Insufficient documentation

## 2015-05-21 DIAGNOSIS — I10 Essential (primary) hypertension: Secondary | ICD-10-CM | POA: Diagnosis not present

## 2015-05-21 DIAGNOSIS — F1010 Alcohol abuse, uncomplicated: Secondary | ICD-10-CM | POA: Diagnosis not present

## 2015-05-21 DIAGNOSIS — F17210 Nicotine dependence, cigarettes, uncomplicated: Secondary | ICD-10-CM | POA: Insufficient documentation

## 2015-05-21 DIAGNOSIS — G8929 Other chronic pain: Secondary | ICD-10-CM | POA: Insufficient documentation

## 2015-05-21 DIAGNOSIS — D649 Anemia, unspecified: Secondary | ICD-10-CM

## 2015-05-21 DIAGNOSIS — B182 Chronic viral hepatitis C: Secondary | ICD-10-CM | POA: Insufficient documentation

## 2015-05-21 DIAGNOSIS — G629 Polyneuropathy, unspecified: Secondary | ICD-10-CM | POA: Diagnosis not present

## 2015-05-21 DIAGNOSIS — Z981 Arthrodesis status: Secondary | ICD-10-CM | POA: Insufficient documentation

## 2015-05-21 DIAGNOSIS — I959 Hypotension, unspecified: Secondary | ICD-10-CM | POA: Diagnosis not present

## 2015-05-21 DIAGNOSIS — E871 Hypo-osmolality and hyponatremia: Secondary | ICD-10-CM | POA: Diagnosis not present

## 2015-05-21 DIAGNOSIS — Z79899 Other long term (current) drug therapy: Secondary | ICD-10-CM | POA: Diagnosis not present

## 2015-05-21 DIAGNOSIS — R188 Other ascites: Secondary | ICD-10-CM

## 2015-05-21 DIAGNOSIS — G2581 Restless legs syndrome: Secondary | ICD-10-CM | POA: Diagnosis not present

## 2015-05-21 DIAGNOSIS — R339 Retention of urine, unspecified: Secondary | ICD-10-CM

## 2015-05-21 LAB — CBC
HCT: 29.6 % — ABNORMAL LOW (ref 35.0–47.0)
Hemoglobin: 9.3 g/dL — ABNORMAL LOW (ref 12.0–16.0)
MCH: 30 pg (ref 26.0–34.0)
MCHC: 31.6 g/dL — ABNORMAL LOW (ref 32.0–36.0)
MCV: 95.1 fL (ref 80.0–100.0)
Platelets: 121 10*3/uL — ABNORMAL LOW (ref 150–440)
RBC: 3.12 MIL/uL — ABNORMAL LOW (ref 3.80–5.20)
RDW: 19.4 % — ABNORMAL HIGH (ref 11.5–14.5)
WBC: 5.3 10*3/uL (ref 3.6–11.0)

## 2015-05-21 LAB — LACTATE DEHYDROGENASE, PLEURAL OR PERITONEAL FLUID: LD, Fluid: 62 U/L — ABNORMAL HIGH (ref 3–23)

## 2015-05-21 LAB — URINALYSIS COMPLETE WITH MICROSCOPIC (ARMC ONLY)
Bacteria, UA: NONE SEEN
Bilirubin Urine: NEGATIVE
Glucose, UA: NEGATIVE mg/dL
Hgb urine dipstick: NEGATIVE
Ketones, ur: NEGATIVE mg/dL
Leukocytes, UA: NEGATIVE
Nitrite: NEGATIVE
Protein, ur: 30 mg/dL — AB
Specific Gravity, Urine: 1.021 (ref 1.005–1.030)
pH: 7 (ref 5.0–8.0)

## 2015-05-21 LAB — LIPASE, BLOOD: Lipase: 41 U/L (ref 11–51)

## 2015-05-21 LAB — BODY FLUID CELL COUNT WITH DIFFERENTIAL
Eos, Fluid: 0 %
Lymphs, Fluid: 57 %
Monocyte-Macrophage-Serous Fluid: 14 %
Neutrophil Count, Fluid: 29 %
Other Cells, Fluid: 0 %
Total Nucleated Cell Count, Fluid: 213 cu mm

## 2015-05-21 LAB — COMPREHENSIVE METABOLIC PANEL
ALT: 38 U/L (ref 14–54)
AST: 65 U/L — ABNORMAL HIGH (ref 15–41)
Albumin: 2.8 g/dL — ABNORMAL LOW (ref 3.5–5.0)
Alkaline Phosphatase: 71 U/L (ref 38–126)
Anion gap: 6 (ref 5–15)
BUN: 7 mg/dL (ref 6–20)
CO2: 23 mmol/L (ref 22–32)
Calcium: 8.2 mg/dL — ABNORMAL LOW (ref 8.9–10.3)
Chloride: 106 mmol/L (ref 101–111)
Creatinine, Ser: 0.55 mg/dL (ref 0.44–1.00)
GFR calc Af Amer: >60 mL/min (ref >60)
GFR calc non Af Amer: >60 mL/min (ref >60)
Glucose, Bld: 155 mg/dL — ABNORMAL HIGH (ref 65–99)
Potassium: 3.6 mmol/L (ref 3.5–5.1)
Sodium: 135 mmol/L (ref 135–145)
Total Bilirubin: 0.9 mg/dL (ref 0.3–1.2)
Total Protein: 6.7 g/dL (ref 6.5–8.1)

## 2015-05-21 LAB — GLUCOSE, SEROUS FLUID: Glucose, Fluid: 142 mg/dL

## 2015-05-21 LAB — ALBUMIN, FLUID (OTHER): Albumin, Fluid: 1.4 g/dL

## 2015-05-21 LAB — PROTIME-INR
INR: 1.65
Prothrombin Time: 19.5 seconds — ABNORMAL HIGH (ref 11.4–15.0)

## 2015-05-21 LAB — AMMONIA: Ammonia: 14 umol/L (ref 9–35)

## 2015-05-21 LAB — PROTEIN, BODY FLUID: Total protein, fluid: <3 g/dL

## 2015-05-21 MED ORDER — ONDANSETRON HCL 4 MG PO TABS: 4.0000 mg | ORAL_TABLET | Freq: Four times a day (QID) | ORAL | Status: DC | PRN

## 2015-05-21 MED ORDER — FUROSEMIDE 40 MG PO TABS
40.0000 mg | ORAL_TABLET | Freq: Every day | ORAL | Status: DC
  Administered 2015-05-21: 40 mg via ORAL
  Filled 2015-05-21 (×2): qty 1

## 2015-05-21 MED ORDER — SPIRONOLACTONE 25 MG PO TABS
25.0000 mg | ORAL_TABLET | Freq: Every day | ORAL | Status: DC
  Administered 2015-05-21: 16:00:00 25 mg via ORAL
  Filled 2015-05-21: qty 1

## 2015-05-21 MED ORDER — ONDANSETRON HCL 4 MG/2ML IJ SOLN: 4.0000 mg | Freq: Once | INTRAMUSCULAR | Status: DC

## 2015-05-21 MED ORDER — ACETAMINOPHEN 650 MG RE SUPP: 650.0000 mg | Freq: Four times a day (QID) | RECTAL | Status: DC | PRN

## 2015-05-21 MED ORDER — ACETAMINOPHEN 325 MG PO TABS: 650.0000 mg | ORAL_TABLET | Freq: Four times a day (QID) | ORAL | Status: DC | PRN

## 2015-05-21 MED ORDER — MAGNESIUM OXIDE 400 (241.3 MG) MG PO TABS
400.0000 mg | ORAL_TABLET | Freq: Two times a day (BID) | ORAL | Status: DC
  Administered 2015-05-21 – 2015-05-22 (×2): 400 mg via ORAL
  Filled 2015-05-21 (×2): qty 1

## 2015-05-21 MED ORDER — SODIUM CHLORIDE 0.9 % IV BOLUS (SEPSIS)
1000.0000 mL | Freq: Once | INTRAVENOUS | Status: AC
  Administered 2015-05-21: 1000 mL via INTRAVENOUS

## 2015-05-21 MED ORDER — PANTOPRAZOLE SODIUM 40 MG PO TBEC
40.0000 mg | DELAYED_RELEASE_TABLET | Freq: Two times a day (BID) | ORAL | Status: DC
  Administered 2015-05-22: 40 mg via ORAL
  Filled 2015-05-21: qty 1

## 2015-05-21 MED ORDER — MORPHINE SULFATE (PF) 4 MG/ML IV SOLN
4.0000 mg | Freq: Once | INTRAVENOUS | Status: AC
  Administered 2015-05-21: 4 mg via INTRAVENOUS
  Filled 2015-05-21: qty 1

## 2015-05-21 MED ORDER — LISINOPRIL-HYDROCHLOROTHIAZIDE 20-12.5 MG PO TABS: 1.0000 | ORAL_TABLET | Freq: Every day | ORAL | Status: DC

## 2015-05-21 MED ORDER — ADULT MULTIVITAMIN W/MINERALS CH
1.0000 | ORAL_TABLET | Freq: Every day | ORAL | Status: DC
  Administered 2015-05-21 – 2015-05-22 (×2): 1 via ORAL
  Filled 2015-05-21 (×2): qty 1

## 2015-05-21 MED ORDER — LISINOPRIL 20 MG PO TABS
20.0000 mg | ORAL_TABLET | Freq: Every day | ORAL | Status: DC
  Filled 2015-05-21: qty 1

## 2015-05-21 MED ORDER — ONDANSETRON HCL 4 MG/2ML IJ SOLN
4.0000 mg | Freq: Once | INTRAMUSCULAR | Status: AC
Start: 2015-05-21 — End: 2015-05-21
  Administered 2015-05-21: 4 mg via INTRAVENOUS
  Filled 2015-05-21: qty 2

## 2015-05-21 MED ORDER — ONDANSETRON HCL 4 MG/2ML IJ SOLN: 4.0000 mg | Freq: Four times a day (QID) | INTRAMUSCULAR | Status: DC | PRN

## 2015-05-21 MED ORDER — MORPHINE SULFATE (PF) 2 MG/ML IV SOLN
2.0000 mg | Freq: Once | INTRAVENOUS | Status: AC
  Administered 2015-05-21: 2 mg via INTRAVENOUS

## 2015-05-21 MED ORDER — CLONAZEPAM 1 MG PO TABS
1.0000 mg | ORAL_TABLET | Freq: Every day | ORAL | Status: DC
  Administered 2015-05-21 – 2015-05-22 (×2): 1 mg via ORAL
  Filled 2015-05-21 (×2): qty 1

## 2015-05-21 MED ORDER — HYDROCHLOROTHIAZIDE 12.5 MG PO CAPS
12.5000 mg | ORAL_CAPSULE | Freq: Every day | ORAL | Status: DC
Start: 2015-05-21 — End: 2015-05-22
  Filled 2015-05-21: qty 1

## 2015-05-21 MED ORDER — OXYCODONE HCL 5 MG PO TABS
10.0000 mg | ORAL_TABLET | Freq: Four times a day (QID) | ORAL | Status: DC | PRN
  Administered 2015-05-21 – 2015-05-22 (×2): 10 mg via ORAL
  Filled 2015-05-21 (×3): qty 2

## 2015-05-21 MED ORDER — ONDANSETRON HCL 4 MG/2ML IJ SOLN
INTRAMUSCULAR | Status: AC
  Filled 2015-05-21: qty 2

## 2015-05-21 MED ORDER — TRAMADOL HCL 50 MG PO TABS: 100.0000 mg | ORAL_TABLET | Freq: Three times a day (TID) | ORAL | Status: DC | PRN

## 2015-05-21 MED ORDER — MORPHINE SULFATE (PF) 2 MG/ML IV SOLN
INTRAVENOUS | Status: AC
  Administered 2015-05-21: 2 mg via INTRAVENOUS
  Filled 2015-05-21: qty 1

## 2015-05-21 MED ORDER — ESCITALOPRAM OXALATE 10 MG PO TABS
20.0000 mg | ORAL_TABLET | Freq: Every day | ORAL | Status: DC
  Administered 2015-05-21 – 2015-05-22 (×2): 20 mg via ORAL
  Filled 2015-05-21 (×2): qty 2

## 2015-05-21 NOTE — ED Provider Notes (Signed)
[facility] Emergency Department Provider Note  ____________________________________________  Time seen: 10:05 AM  I have reviewed the triage vital signs and the nursing notes.   HISTORY  Chief Complaint Ascites and Urinary Retention      HPI Audrey Garrett is a 55 y.o. female history of alcohol abuse and cirrhosis of the liver patient states she has not drank in 10 days presents with progressive abdominal distention over the past few months acute worsening over the past week. Patient also admits to diffuse abdominal discomfort. Patient also admits to urinary retention with inability to void in the past 72 hours other than small quantities. In addition the patient also admits to respiratory difficulty over the past 24-48 hours.     Past Medical History  Diagnosis Date  . Depression   . Anxiety   . High risk medications (not anticoagulants) long-term use   . Vitamin D deficiency disease   . Lumbago   . Hypertension   . Chronic hepatitis C ([place])   . Alcohol abuse   . Bronchitis   . Arthropathy   . Back pain   . Shoulder pain   . Stress headaches   . Hyperglycemia   . SOB (shortness of breath)   . Muscle spasm   . Edema   . Cough   . Polyarthritis   . Fatigue   . Sinusitis   . RLS (restless legs syndrome)   . Alcoholic cirrhosis of liver without ascites ([place])   . Reflux   . Fibromyalgia   . Spine disorder   . Chronic LBP 12/25/2014    Overview:  Post extensive surgery   . DDD (degenerative disc disease), lumbar 01/01/2015  . Lumbar radicular pain 01/01/2015  . Neck pain 01/01/2015  . Left leg pain 01/01/2015  . Sacroiliac pain 01/01/2015  . Upper back pain 01/01/2015    Patient Active Problem List   Diagnosis Date Noted  . Hypomagnesemia (low magnesium levels) 04/07/2015  . Chronic pain 04/03/2015  . At risk for osteopenia 04/03/2015  . Chronic low back pain (Bilateral) 04/03/2015  . Lumbar spondylosis 04/03/2015  . Chronic lumbar  radicular pain (Left) 04/03/2015  . Chronic neck pain 04/03/2015  . Chronic lower extremity pain (Left) 04/03/2015  . Chronic sacroiliac joint pain 04/03/2015  . Chronic upper back pain 04/03/2015  . Long term current use of opiate analgesic 04/03/2015  . Encounter for chronic pain management 04/03/2015  . Bilateral low back pain with left-sided sciatica 01/01/2015  . Chronic pain syndrome 01/01/2015  . Cervical radicular pain 01/01/2015  . Other long term (current) drug therapy 01/01/2015  . Uncomplicated opioid dependence ([place]) 01/01/2015  . Long term prescription opiate use 01/01/2015  . Lumbar facet arthropathy 01/01/2015  . 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 Lumbar facet syndrome (Bilateral) 01/01/2015  . Epidural fibrosis 01/01/2015  . Osteoarthritis of spine with radiculopathy, lumbar region 01/01/2015  . Pain of paraspinal muscle 01/01/2015  . Low back pain with radiation 01/01/2015  . Opiate use 01/01/2015  . Encounter for therapeutic drug level monitoring 01/01/2015  . 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 Insomnia, persistent 12/25/2014  . Major depression in remission ([place]) 12/25/2014  . BP (high blood pressure) 12/25/2014  . Encounter for general adult medical examination without abnormal findings 12/25/2014  . HCV (hepatitis C virus) 12/25/2014  . ACUTE SINUSITIS, UNSPECIFIED 11/01/2009  . BRONCHITIS, ACUTE 11/01/2009    Past Surgical History  Procedure Laterality Date  . Tubal ligation    .  Tonsillectomy    . Back surgery  12/19/2011  . Esophagogastroduodenoscopy N/A 04/14/2015    Procedure: ESOPHAGOGASTRODUODENOSCOPY (EGD);  Surgeon: Lollie Sails, MD;  Location: [facility] ENDOSCOPY;  Service: Endoscopy;  Laterality: N/A;    Current Outpatient Rx  Name  Route  Sig  Dispense  Refill  . clonazePAM (KLONOPIN) 1 MG tablet   Oral   Take 1 mg by mouth daily.          Marland Kitchen escitalopram (LEXAPRO) 20 MG tablet   Oral   Take 20 mg by mouth daily.           . furosemide (LASIX) 40 MG tablet   Oral   Take 40 mg by mouth daily.          Marland Kitchen lisinopril-hydrochlorothiazide (PRINZIDE,ZESTORETIC) 20-12.5 MG tablet   Oral   Take 1 tablet by mouth daily.         . Magnesium Oxide 500 MG CAPS   Oral   Take 1 capsule (500 mg total) by mouth 2 (two) times daily as needed.   60 capsule   PRN     Do not place this medication, or any other prescri ...   . Multiple Vitamins-Minerals (MULTIVITAMIN WITH MINERALS) tablet   Oral   Take 1 tablet by mouth daily.         . Oxycodone HCl 20 MG TABS   Oral   Take 1 tablet (20 mg total) by mouth every 6 (six) hours as needed.   120 tablet   0     Do not place this medication, or any other prescri ...   . pantoprazole (PROTONIX) 40 MG tablet   Oral   Take 40 mg by mouth 2 (two) times daily.         Marland Kitchen spironolactone (ALDACTONE) 25 MG tablet   Oral   Take 25 mg by mouth daily.         . traMADol (ULTRAM-ER) 100 MG 24 hr tablet   Oral   Take 100 mg by mouth daily as needed for pain.           Allergies Review of patient's allergies indicates no known allergies.  Family History  Problem Relation Age of Onset  . Stroke Mother   . Heart disease Mother   . [name] Father     Social History Social History  Substance Use Topics  . Smoking status: Current Every Day Smoker -- 1.00 packs/day for 30 years    Types: Cigarettes  . Smokeless tobacco: None  . Alcohol Use: 0.0 oz/week    0 Standard drinks or equivalent per week    Review of Systems  Constitutional: Negative for fever. Eyes: Negative for visual changes. ENT: Negative for sore throat. Cardiovascular: Negative for chest pain. Respiratory: Negative for shortness of breath. Gastrointestinal: Positive for abdominal pain and distention Genitourinary: Negative for dysuria. Musculoskeletal: Negative for back pain. Skin: Negative for rash. Neurological: Negative for headaches, focal weakness or numbness.   10-point  ROS otherwise negative.  ____________________________________________   PHYSICAL EXAM:  VITAL SIGNS: ED Triage Vitals  Enc Vitals Group     BP 05/21/15 1000 129/53 mmHg     Pulse Rate 05/21/15 1000 77     Resp 05/21/15 1000 18     Temp 05/21/15 1000 98.4 F (36.9 C)     Temp Source 05/21/15 1000 Oral     SpO2 05/21/15 1000 96 %     Weight 05/21/15 1000 154 lb (69.854 kg)  Height 05/21/15 1000 6' (1.829 m)     Head Cir --      Peak Flow --      Pain Score 05/21/15 0956 9     Pain Loc --      Pain Edu? --      Excl. in [place]? --      Constitutional: Alert and oriented. Well appearing and in no distress. Eyes: Conjunctivae are normal. PERRL. Normal extraocular movements. ENT   Head: Normocephalic and atraumatic.   Nose: No congestion/rhinnorhea.   Mouth/Throat: Mucous membranes are moist.   Neck: No stridor. Hematological/Lymphatic/Immunilogical: No cervical lymphadenopathy. Cardiovascular: Normal rate, regular rhythm. Normal and symmetric distal pulses are present in all extremities. No murmurs, rubs, or gallops. Respiratory: Normal respiratory effort without tachypnea nor retractions. Bibasilar rales Gastrointestinal: Positive abdominal distention with positive fluid wave,. Genitourinary: deferred Musculoskeletal: Nontender with normal range of motion in all extremities. No joint effusions.  No lower extremity tenderness nor edema. Neurologic:  Normal speech and language. No gross focal neurologic deficits are appreciated. Speech is normal.  Skin:  Skin is warm, dry and intact. No rash noted. Psychiatric: Mood and affect are normal. Speech and behavior are normal. Patient exhibits appropriate insight and judgment.  ____________________________________________    LABS (pertinent positives/negatives)  Labs Reviewed  COMPREHENSIVE METABOLIC PANEL - Abnormal; Notable for the following:    Glucose, Bld 155 (*)    Calcium 8.2 (*)    Albumin 2.8 (*)    AST 65  (*)    All other components within normal limits  CBC - Abnormal; Notable for the following:    RBC 3.12 (*)    Hemoglobin 9.3 (*)    HCT 29.6 (*)    MCHC 31.6 (*)    RDW 19.4 (*)    Platelets 121 (*)    All other components within normal limits  URINALYSIS COMPLETEWITH MICROSCOPIC ([facility] ONLY) - Abnormal; Notable for the following:    Color, Urine AMBER (*)    APPearance CLEAR (*)    Protein, ur 30 (*)    Squamous Epithelial / LPF 0-5 (*)    All other components within normal limits  PROTIME-INR - Abnormal; Notable for the following:    Prothrombin Time 19.5 (*)    All other components within normal limits  LIPASE, BLOOD  AMMONIA       RADIOLOGY  DG Chest 2 View (Final result) Result time: 05/21/15 12:34:50   Final result by Rad Results In Interface (05/21/15 12:34:50)   Narrative:   CLINICAL DATA: Shortness of breath beginning 3 days ago.  EXAM: CHEST 2 VIEW  COMPARISON: 09/23/2013  FINDINGS: Heart size is normal. Mediastinal shadows are normal. The patient has developed mild atelectasis in both lower lungs with small pleural effusions, larger on the right than the left. No evidence of dense consolidation or lobar collapse. No acute bone finding. Previous thoracolumbar spinal fusion.  IMPRESSION: Development of mild atelectasis at both lung bases with small effusions, right larger than left.   Electronically Signed By: Nelson Chimes M.D. On: 05/21/2015 12:34          US Abdomen Limited (Final result) Result time: 05/21/15 11:35:16   Procedure changed from US Abdomen Complete      Final result by Rad Results In Interface (05/21/15 11:35:16)   Narrative:   CLINICAL DATA: Abdominal distention. Hepatic cirrhosis.  EXAM: LIMITED ABDOMEN ULTRASOUND FOR ASCITES  TECHNIQUE: Limited ultrasound survey for ascites was performed in all four abdominal quadrants.  COMPARISON: May 06, 2015  FINDINGS: There is moderate ascites  throughout the abdomen and pelvis. Liver has a nodular contour consistent with known cirrhosis.  IMPRESSION: Moderate generalized ascites.   Electronically Signed By: Lowella Grip III M.D. On: 05/21/2015 11:35           INITIAL IMPRESSION / ASSESSMENT AND PLAN / ED COURSE  Pertinent labs & imaging results that were available during my care of the patient were reviewed by me and considered in my medical decision making (see chart for details).  Even increased work of breathing increased ascites and abdominal discomfort patient discussed with Dr. Billy Coast for hospital admission for further evaluation and management of possible paracentesis  ____________________________________________   FINAL CLINICAL IMPRESSION(S) / ED DIAGNOSES  Final diagnoses:  Ascites  Pleural effusion      Gregor Hams, MD 05/21/15 1322

## 2015-05-21 NOTE — ED Notes (Signed)
Pt reports abdominal bloating "for months"; reports she's seen her liver MD and sent here for eval. Pt reports urinary retention.

## 2015-05-21 NOTE — Procedures (Signed)
US guided paracentesis.  Removed 4.4 liters of yellow fluid without complication.

## 2015-05-21 NOTE — ED Notes (Signed)
Bladder scan results greater than 945 mL.

## 2015-05-21 NOTE — H&P (Signed)
[place] [facility] [place] NAME: Audrey Garrett    MR#:  [id number]  DATE OF BIRTH:  December 15, 1960  DATE OF ADMISSION:  05/21/2015  PRIMARY CARE PHYSICIAN: Kirk Ruths., MD   REQUESTING/REFERRING PHYSICIAN: Dr. Marjean Donna  CHIEF COMPLAINT:   Chief Complaint  Patient presents with  . Ascites  . Urinary Retention    HISTORY OF PRESENT ILLNESS:  Audrey Garrett  is a 55 y.o. female with a known history of hepatitis C, alcoholic liver cirrhosis with esophageal varesis diagnosed recently, iron deficiency anemia requiring transfusion once recently, hypertension, status post back pain status post back surgeries, ongoing smoking presents to the hospital secondary to worsening abdominal distention. She has been following up with a gastroenterologist for the last month due to new diagnosis of for cirrhosis. She's never had paracentesis before. Her distention is much worse in the past week to the point that she is having trouble breathing even getting up out of bed to walk. She is not hypoxic in the emergency room. Abdomen is distended. Will admit for ultrasound-guided paracentesis and monitoring after that.  PAST MEDICAL HISTORY:   Past Medical History  Diagnosis Date  . Depression   . Anxiety   . High risk medications (not anticoagulants) long-term use   . Vitamin D deficiency disease   . Lumbago   . Hypertension   . Chronic hepatitis C ([place])   . Alcohol abuse   . Bronchitis   . Arthropathy   . Back pain   . Shoulder pain   . Stress headaches   . Hyperglycemia   . SOB (shortness of breath)   . Muscle spasm   . Edema   . Cirrhosis ([place])   . Polyarthritis   . Fatigue   . Sinusitis   . RLS (restless legs syndrome)   . Alcoholic cirrhosis of liver without ascites ([place])   . Reflux   . Fibromyalgia   . Spine disorder   . Chronic LBP 12/25/2014    Overview:  Post extensive surgery   . DDD (degenerative disc disease), lumbar 01/01/2015   . Lumbar radicular pain 01/01/2015  . Neck pain 01/01/2015  . Left leg pain 01/01/2015  . Sacroiliac pain 01/01/2015  . Upper back pain 01/01/2015    PAST SURGICAL HISTORY:   Past Surgical History  Procedure Laterality Date  . Tubal ligation    . Tonsillectomy    . Back surgery  12/19/2011  . Esophagogastroduodenoscopy N/A 04/14/2015    Procedure: ESOPHAGOGASTRODUODENOSCOPY (EGD);  Surgeon: Lollie Sails, MD;  Location: [facility] ENDOSCOPY;  Service: Endoscopy;  Laterality: N/A;    SOCIAL HISTORY:   Social History  Substance Use Topics  . Smoking status: Current Every Day Smoker -- 1.00 packs/day for 30 years    Types: Cigarettes  . Smokeless tobacco: Not on file  . Alcohol Use: 0.0 oz/week    0 Standard drinks or equivalent per week     Comment: but 10 days post stopping    FAMILY HISTORY:   Family History  Problem Relation Age of Onset  . Stroke Mother   . Heart disease Mother   . [name] Father     DRUG ALLERGIES:  No Known Allergies  REVIEW OF SYSTEMS:   Review of Systems  Constitutional: Positive for malaise/fatigue. Negative for fever, chills and weight loss.  HENT: Negative for ear discharge, ear pain, hearing loss and nosebleeds.   Eyes: Negative for blurred vision, double vision and photophobia.  Respiratory: Negative for cough, hemoptysis, shortness of breath and wheezing.   Cardiovascular: Negative for chest pain, palpitations, orthopnea and leg swelling.  Gastrointestinal: Positive for nausea. Negative for heartburn, vomiting, abdominal pain, diarrhea, constipation and melena.       Abdominal distention  Genitourinary: Positive for urgency. Negative for dysuria, frequency and hematuria.  Musculoskeletal: Positive for myalgias and back pain. Negative for neck pain.  Skin: Negative for rash.  Neurological: Negative for dizziness, tingling, sensory change, speech change, focal weakness and headaches.  Endo/Heme/Allergies: Does not bruise/bleed easily.   Psychiatric/Behavioral: Negative for depression.    MEDICATIONS AT HOME:   Prior to Admission medications   Medication Sig Start Date End Date Taking? Authorizing Provider  clonazePAM (KLONOPIN) 1 MG tablet Take 1 mg by mouth daily.    Yes Historical Provider, MD  escitalopram (LEXAPRO) 20 MG tablet Take 20 mg by mouth daily.    Yes Historical Provider, MD  furosemide (LASIX) 40 MG tablet Take 40 mg by mouth daily.    Yes Historical Provider, MD  lisinopril-hydrochlorothiazide (PRINZIDE,ZESTORETIC) 20-12.5 MG tablet Take 1 tablet by mouth daily.   Yes Historical Provider, MD  Magnesium Oxide 500 MG CAPS Take 1 capsule (500 mg total) by mouth 2 (two) times daily as needed. 04/07/15  Yes [name], MD  Multiple Vitamins-Minerals (MULTIVITAMIN WITH MINERALS) tablet Take 1 tablet by mouth daily.   Yes Historical Provider, MD  Oxycodone HCl 20 MG TABS Take 1 tablet (20 mg total) by mouth every 6 (six) hours as needed. 04/03/15  Yes [name], MD  pantoprazole (PROTONIX) 40 MG tablet Take 40 mg by mouth 2 (two) times daily.   Yes Historical Provider, MD  spironolactone (ALDACTONE) 25 MG tablet Take 25 mg by mouth daily.   Yes Historical Provider, MD  traMADol (ULTRAM-ER) 100 MG 24 hr tablet Take 100 mg by mouth daily as needed for pain.   Yes Historical Provider, MD      VITAL SIGNS:  Blood pressure 114/66, pulse 77, temperature 98.4 F (36.9 C), temperature source Oral, resp. rate 18, height 6' (1.829 m), weight 69.854 kg (154 lb), SpO2 91 %.  PHYSICAL EXAMINATION:   Physical Exam  GENERAL:  55 y.o.-year-old patient lying in the bed with no acute distress.  EYES: Pupils equal, round, reactive to light and accommodation. No scleral icterus. Extraocular muscles intact.  HEENT: Head atraumatic, normocephalic. Oropharynx and nasopharynx clear.  NECK:  Supple, no jugular venous distention. No thyroid enlargement, no tenderness.  LUNGS: Normal breath sounds bilaterally, no  wheezing, rales,rhonchi or crepitation. No use of accessory muscles of respiration.  CARDIOVASCULAR: S1, S2 normal. No murmurs, rubs, or gallops.  ABDOMEN: Soft, nontender, distended but soft abdomen. Bowel sounds present. No organomegaly or mass.  EXTREMITIES: No pedal edema, cyanosis, or clubbing.  NEUROLOGIC: Cranial nerves II through XII are intact. Muscle strength 5/5 in all extremities. Sensation intact. Gait not checked.  PSYCHIATRIC: The patient is alert and oriented x 3.  SKIN: No obvious rash, lesion, or ulcer.   LABORATORY PANEL:   CBC  Recent Labs Lab 05/21/15 1006  WBC 5.3  HGB 9.3*  HCT 29.6*  PLT 121*   ------------------------------------------------------------------------------------------------------------------  Chemistries   Recent Labs Lab 05/21/15 1006  NA 135  K 3.6  CL 106  CO2 23  GLUCOSE 155*  BUN 7  CREATININE 0.55  CALCIUM 8.2*  AST 65*  ALT 38  ALKPHOS 71  BILITOT 0.9   ------------------------------------------------------------------------------------------------------------------  Cardiac Enzymes No results for input(s): TROPONINI  in the last 168 hours. ------------------------------------------------------------------------------------------------------------------  RADIOLOGY:  Dg Chest 2 View  05/21/2015  CLINICAL DATA:  Shortness of breath beginning 3 days ago. EXAM: CHEST  2 VIEW COMPARISON:  09/23/2013 FINDINGS: Heart size is normal. Mediastinal shadows are normal. The patient has developed mild atelectasis in both lower lungs with small pleural effusions, larger on the right than the left. No evidence of dense consolidation or lobar collapse. No acute bone finding. Previous thoracolumbar spinal fusion. IMPRESSION: Development of mild atelectasis at both lung bases with small effusions, right larger than left. Electronically Signed   By: Nelson Chimes M.D.   On: 05/21/2015 12:34   US Abdomen Limited  05/21/2015  CLINICAL DATA:   Abdominal distention.  Hepatic cirrhosis. EXAM: LIMITED ABDOMEN ULTRASOUND FOR ASCITES TECHNIQUE: Limited ultrasound survey for ascites was performed in all four abdominal quadrants. COMPARISON:  May 06, 2015 FINDINGS: There is moderate ascites throughout the abdomen and pelvis. Liver has a nodular contour consistent with known cirrhosis. IMPRESSION: Moderate generalized ascites. Electronically Signed   By: Lowella Grip III M.D.   On: 05/21/2015 11:35    EKG:   Orders placed or performed in visit on 01/06/14  . EKG 12-Lead    IMPRESSION AND PLAN:   Inola [name]  is a 55 y.o. female with a known history of hepatitis C, alcoholic liver cirrhosis with esophageal varesis diagnosed recently, iron deficiency anemia requiring transfusion once recently, hypertension, status post back pain status post back surgeries, ongoing smoking presents to the hospital secondary to worsening abdominal distention.   #1 Ascites- secondary to liver cirrhosis. We will order for ultrasound guided paracentesis and send the fluid for labs. -She appears slightly dyspneic, but not hypoxic. -Likely will be stable after paracentesis. She if she is stable she can be discharged later today or tomorrow morning. - no tenderness noted. No signs of peritonitis. We'll hold off on antibiotics.  #2 HTN-on losartan and hydrochlorothiazide-continue  #3 Liver cirrhosis-following up with GI. Counseled against alcohol. States she quit about 10 days ago. -Continue her Diuretics including Lasix, Aldactone  #4 Low-back pain-continue pain medications as needed  #5 DVT prophylaxis-hold off on heparin products for now due to possible paracentesis today  #6 GERD-continue Protonix twice a day    All the records are reviewed and case discussed with ED provider. Management plans discussed with the patient, family and they are in agreement.  CODE STATUS: Full Code  TOTAL TIME TAKING CARE OF THIS PATIENT: 50 minutes.     Octavia Velador M.D on 05/21/2015 at 1:50 PM  Between 7am to 6pm - Pager - [phone]  After 6pm go to [email or web address] - password [email or web address] [facility]  Office  [phone]  CC: Primary care physician; Kirk Ruths., MD

## 2015-05-21 NOTE — ED Notes (Signed)
Pt presents with abdominal swelling and discomfort. Pt states she stopped drinking alcohol ten days ago. Pt states her liver doctor recommended she be seen in the ED today. Pt states has not voided in three days. Pt states she tries to urinated but is only able to dribble.

## 2015-05-22 DIAGNOSIS — I959 Hypotension, unspecified: Secondary | ICD-10-CM

## 2015-05-22 DIAGNOSIS — D689 Coagulation defect, unspecified: Secondary | ICD-10-CM

## 2015-05-22 DIAGNOSIS — D649 Anemia, unspecified: Secondary | ICD-10-CM

## 2015-05-22 DIAGNOSIS — G629 Polyneuropathy, unspecified: Secondary | ICD-10-CM

## 2015-05-22 DIAGNOSIS — K7031 Alcoholic cirrhosis of liver with ascites: Secondary | ICD-10-CM | POA: Diagnosis not present

## 2015-05-22 DIAGNOSIS — R339 Retention of urine, unspecified: Secondary | ICD-10-CM

## 2015-05-22 DIAGNOSIS — D696 Thrombocytopenia, unspecified: Secondary | ICD-10-CM

## 2015-05-22 DIAGNOSIS — R739 Hyperglycemia, unspecified: Secondary | ICD-10-CM

## 2015-05-22 LAB — COMPREHENSIVE METABOLIC PANEL
ALT: 31 U/L (ref 14–54)
AST: 58 U/L — ABNORMAL HIGH (ref 15–41)
Albumin: 2.3 g/dL — ABNORMAL LOW (ref 3.5–5.0)
Alkaline Phosphatase: 68 U/L (ref 38–126)
Anion gap: 5 (ref 5–15)
BUN: 7 mg/dL (ref 6–20)
CO2: 26 mmol/L (ref 22–32)
Calcium: 8 mg/dL — ABNORMAL LOW (ref 8.9–10.3)
Chloride: 107 mmol/L (ref 101–111)
Creatinine, Ser: 0.58 mg/dL (ref 0.44–1.00)
GFR calc Af Amer: >60 mL/min (ref >60)
GFR calc non Af Amer: >60 mL/min (ref >60)
Glucose, Bld: 127 mg/dL — ABNORMAL HIGH (ref 65–99)
Potassium: 3.6 mmol/L (ref 3.5–5.1)
Sodium: 138 mmol/L (ref 135–145)
Total Bilirubin: 0.3 mg/dL (ref 0.3–1.2)
Total Protein: 5.4 g/dL — ABNORMAL LOW (ref 6.5–8.1)

## 2015-05-22 LAB — CBC
HCT: 26.7 % — ABNORMAL LOW (ref 35.0–47.0)
Hemoglobin: 8.7 g/dL — ABNORMAL LOW (ref 12.0–16.0)
MCH: 30.7 pg (ref 26.0–34.0)
MCHC: 32.6 g/dL (ref 32.0–36.0)
MCV: 94.2 fL (ref 80.0–100.0)
Platelets: 99 10*3/uL — ABNORMAL LOW (ref 150–440)
RBC: 2.83 MIL/uL — ABNORMAL LOW (ref 3.80–5.20)
RDW: 20 % — ABNORMAL HIGH (ref 11.5–14.5)
WBC: 4.6 10*3/uL (ref 3.6–11.0)

## 2015-05-22 LAB — GRAM STAIN

## 2015-05-22 LAB — HEMOGLOBIN A1C: Hgb A1c MFr Bld: 5.3 % (ref 4.0–6.0)

## 2015-05-22 LAB — PLATELET COUNT: Platelets: 122 10*3/uL — ABNORMAL LOW (ref 150–440)

## 2015-05-22 MED ORDER — PREGABALIN 25 MG PO CAPS: 25.0000 mg | ORAL_CAPSULE | Freq: Two times a day (BID) | ORAL | Status: DC

## 2015-05-22 MED ORDER — PREGABALIN 25 MG PO CAPS
25.0000 mg | ORAL_CAPSULE | Freq: Two times a day (BID) | ORAL | Status: DC
  Administered 2015-05-22: 25 mg via ORAL
  Filled 2015-05-22: qty 1

## 2015-05-22 NOTE — Care Management Obs Status (Signed)
[facility] NOTIFICATION   Patient Details  Name: Audrey Garrett MRN: [id number] Date of Birth: [birth year]   Medicare Observation Status Notification Given:  No, pt discharged in less than 24 hours after admission to hospital.     Delrae Sawyers, RN 05/22/2015, 12:11 PM

## 2015-05-22 NOTE — Care Management (Signed)
Admitted to this facility with the diagnosis of ascites. Post paracentesis 05/21/15.Lives with husband, Zenia Resides [phone]).  Last seen Dr. Ouida Sills on Monday. No home Health. No skilled facility. Takes care of all basic activities of daily living herself, but husband has to help with shoes and socks. Uses no aids for ambulation. No falls. Good appetite. Husband will transport. Shelbie Ammons RN MSN CCM Care Management [phone]

## 2015-05-22 NOTE — Discharge Summary (Signed)
[facility] NAME: Audrey Garrett    MR#:  [id number]  DATE OF BIRTH:  1960-08-28  DATE OF ADMISSION:  05/21/2015 ADMITTING PHYSICIAN: Gladstone Lighter, MD  DATE OF DISCHARGE: 05/22/2015  4:10 PM  PRIMARY CARE PHYSICIAN: Kirk Ruths., MD     ADMISSION DIAGNOSIS:  Abdominal distention [R14.0] Pleural effusion [J90] Ascites [R18.8]  DISCHARGE DIAGNOSIS:  Principal Problem:   Ascites Active Problems:   Liver cirrhosis, alcoholic (HCC)   Hepatitis C   Hypotension   Polyneuropathy (HCC)   Anemia   Thrombocytopenia (HCC)   Coagulopathy (HCC)   Hyperglycemia   Urinary retention   SECONDARY DIAGNOSIS:   Past Medical History  Diagnosis Date  . Depression   . Anxiety   . High risk medications (not anticoagulants) long-term use   . Vitamin D deficiency disease   . Lumbago   . Hypertension   . Chronic hepatitis C ([place])   . Alcohol abuse   . Bronchitis   . Arthropathy   . Back pain   . Shoulder pain   . Stress headaches   . Hyperglycemia   . SOB (shortness of breath)   . Muscle spasm   . Edema   . Cirrhosis ([place])   . Polyarthritis   . Fatigue   . Sinusitis   . RLS (restless legs syndrome)   . Alcoholic cirrhosis of liver without ascites ([place])   . Reflux   . Fibromyalgia   . Spine disorder   . Chronic LBP 12/25/2014    Overview:  Post extensive surgery   . DDD (degenerative disc disease), lumbar 01/01/2015  . Lumbar radicular pain 01/01/2015  . Neck pain 01/01/2015  . Left leg pain 01/01/2015  . Sacroiliac pain 01/01/2015  . Upper back pain 01/01/2015    .pro HOSPITAL COURSE:   The patient is 55 year old Caucasian female with past medical history significant for the history of alcohol abuse and chronic hepatitis C -induced liver cirrhosis, sober for the past 11 days, hepatitis C, esophageal varices, iron deficiency anemia, hypertension, back problems, ongoing smoking, who presents to the hospital  with complaints of lower extremity as well as abdominal swelling. On arrival to emergency room, she was noted to have distended abdomen. Her labs revealed a hyponatremia, elevated AST level to 61. Anemia with hemoglobin level of 7.4. Patient was transfused 1 unit packed red blood cells with improvement of hemoglobin level to 9.3, coagulopathy with pro time 18.5/INR 1.54, thrombocytopenia with platelet count 123. Distended abdomen revealed moderate generalized sinusitis. Patient underwent ultrasound-guided paracentesis, total of 4.4 L of yellowish fluid was removed. Labs revealed glucose level of 142, LDH 62, white blood cell count 213, 57% of them where lymphocytes, 29% neutrophils. Postprocedure, patient was slightly hypotensive, necessitating holding of her blood pressure medications and diuretics. She did well on the day of discharge and felt to be stable to be discharged home. She was advised not to take the pressure medications at all and reinitiate diuretics including Lasix and spironolactone when her systolic blood pressure is above 100. She was advised to follow-up with primary care physician as outpatient. Patient was complaining of some urinary retention while in the hospital, urology follow-up may be beneficial  Discussion by problem   #1 ascites in alcoholic liver cirrhosis. Patient status post paracentesis of 4.4 L during this admission, postprocedure, patient is noted to be hypotensive, necessitating holding of diuretics and blood pressure medications. Blood pressure remains stable at around 90 systolic.  We feel that patient is stable to be discharged home. She is to resume her diuretics when her systolic blood pressure is above 100. Patient is to follow-up with primary care physician for further recommendations. #2. Hypertension as above. Patient is to hold her blood pressure medications and diuretics until systolic blood pressure above 100. Patient was ambulated and had no lightheadedness or  dizziness while in the hospital. #3 anemia, status post 1 unit of packed red blood cell transfusion, no bleeding was noted and hemoglobin level remained relatively stable. It is recommended to follow patient's hemoglobin level as outpatient closely #4. Urinary retention, patient is to follow-up with urologist as outpatient  #5. Hyperglycemia, hemoglobin A1c was ordered, however, not obtained by the time of discharge   #6, thrombocytopenia, stable #7. Hyponatremia, improved with therapy, follow-up with diuretics. #8, coagulopathy  due to liver cirrhosis, follow closely as outpatient   DISCHARGE CONDITIONS:  Stable   DRUG ALLERGIES:  No Known Allergies  DISCHARGE MEDICATIONS:   Discharge Medication List as of 05/22/2015  3:10 PM    START taking these medications   Details  pregabalin (LYRICA) 25 MG capsule Take 1 capsule (25 mg total) by mouth 2 (two) times daily., Starting 05/22/2015, Until Discontinued, Print      CONTINUE these medications which have NOT CHANGED   Details  clonazePAM (KLONOPIN) 1 MG tablet Take 1 mg by mouth daily. , Until Discontinued, Historical Med    escitalopram (LEXAPRO) 20 MG tablet Take 20 mg by mouth daily. , Until Discontinued, Historical Med    furosemide (LASIX) 40 MG tablet Take 40 mg by mouth daily. , Until Discontinued, Historical Med    Magnesium Oxide 500 MG CAPS Take 1 capsule (500 mg total) by mouth 2 (two) times daily as needed., Starting 04/07/2015, Until Discontinued, Normal    Multiple Vitamins-Minerals (MULTIVITAMIN WITH MINERALS) tablet Take 1 tablet by mouth daily., Until Discontinued, Historical Med    Oxycodone HCl 20 MG TABS Take 1 tablet (20 mg total) by mouth every 6 (six) hours as needed., Starting 04/03/2015, Until Discontinued, Print    pantoprazole (PROTONIX) 40 MG tablet Take 40 mg by mouth 2 (two) times daily., Until Discontinued, Historical Med    spironolactone (ALDACTONE) 25 MG tablet Take 25 mg by mouth daily., Until  Discontinued, Historical Med    traMADol (ULTRAM-ER) 100 MG 24 hr tablet Take 100 mg by mouth daily as needed for pain., Until Discontinued, Historical Med      STOP taking these medications     lisinopril-hydrochlorothiazide (PRINZIDE,ZESTORETIC) 20-12.5 MG tablet          . Patient is to follow-up with primary care physician  the patient is to follow-up with primary care physician, urologist as outpatient  If you experience worsening of your admission symptoms, develop shortness of breath, life threatening emergency, suicidal or homicidal thoughts you must seek medical attention immediately by calling 911 or calling your MD immediately  if symptoms less severe.  You Must read complete instructions/literature along with all the possible adverse reactions/side effects for all the Medicines you take and that have been prescribed to you. Take any new Medicines after you have completely understood and accept all the possible adverse reactions/side effects.   Please note  You were cared for by a hospitalist during your hospital stay. If you have any questions about your discharge medications or the care you received while you were in the hospital after you are discharged, you can call the unit and asked to  speak with the hospitalist on call if the hospitalist that took care of you is not available. Once you are discharged, your primary care physician will handle any further medical issues. Please note that NO REFILLS for any discharge medications will be authorized once you are discharged, as it is imperative that you return to your primary care physician (or establish a relationship with a primary care physician if you do not have one) for your aftercare needs so that they can reassess your need for medications and monitor your lab values.    Today   CHIEF COMPLAINT:   Chief Complaint  Patient presents with  . Ascites  . Urinary Retention    HISTORY OF PRESENT ILLNESS:  Audrey Garrett   is a 55 y.o. female with a known history of alcohol abuse and chronic hepatitis C -induced liver cirrhosis, sober for the past 11 days, hepatitis C, esophageal varices, iron deficiency anemia, hypertension, back problems, ongoing smoking, who presents to the hospital with complaints of lower extremity as well as abdominal swelling. On arrival to emergency room, she was noted to have distended abdomen. Her labs revealed a hyponatremia, elevated AST level to 61. Anemia with hemoglobin level of 7.4. Patient was transfused 1 unit packed red blood cells with improvement of hemoglobin level to 9.3, coagulopathy with pro time 18.5/INR 1.54, thrombocytopenia with platelet count 123. Distended abdomen revealed moderate generalized sinusitis. Patient underwent ultrasound-guided paracentesis, total of 4.4 L of yellowish fluid was removed. Labs revealed glucose level of 142, LDH 62, white blood cell count 213, 57% of them where lymphocytes, 29% neutrophils. Postprocedure, patient was slightly hypotensive, necessitating holding of her blood pressure medications and diuretics. She did well on the day of discharge and felt to be stable to be discharged home. She was advised not to take the pressure medications at all and reinitiate diuretics including Lasix and spironolactone when her systolic blood pressure is above 100. She was advised to follow-up with primary care physician as outpatient. Patient was complaining of some urinary retention while in the hospital, urology follow-up may be beneficial  Discussion by problem   #1 ascites in alcoholic liver cirrhosis. Patient status post paracentesis of 4.4 L during this admission, postprocedure, patient is noted to be hypotensive, necessitating holding of diuretics and blood pressure medications. Blood pressure remains stable at around 90 systolic. We feel that patient is stable to be discharged home. She is to resume her diuretics when her systolic blood pressure is above 100.  Patient is to follow-up with primary care physician for further recommendations. #2. Hypertension as above. Patient is to hold her blood pressure medications and diuretics until systolic blood pressure above 100. Patient was ambulated and had no lightheadedness or dizziness while in the hospital. #3 anemia, status post 1 unit of packed red blood cell transfusion, no bleeding was noted and hemoglobin level remained relatively stable. It is recommended to follow patient's hemoglobin level as outpatient closely #4. Urinary retention, patient is to follow-up with urologist as outpatient  #5. Hyperglycemia, hemoglobin A1c was ordered, however, not obtained by the time of discharge   #6, thrombocytopenia, stable #7. Hyponatremia, improved with therapy, follow-up with diuretics. #8, coagulopathy  due to liver cirrhosis, follow closely as outpatient     VITAL SIGNS:  Blood pressure 91/51, pulse 70, temperature 98.5 F (36.9 C), temperature source Oral, resp. rate 18, height 6' (1.829 m), weight 69.854 kg (154 lb), SpO2 98 %.  I/O:   Intake/Output Summary (Last 24 hours) at 05/22/15  [place] filed at 05/22/15 1319  Gross per 24 hour  Intake    360 ml  Output   2533 ml  Net  -2173 ml    PHYSICAL EXAMINATION:  GENERAL:  55 y.o.-year-old patient lying in the bed with no acute distress.  EYES: Pupils equal, round, reactive to light and accommodation. No scleral icterus. Extraocular muscles intact.  HEENT: Head atraumatic, normocephalic. Oropharynx and nasopharynx clear.  NECK:  Supple, no jugular venous distention. No thyroid enlargement, no tenderness.  LUNGS: Normal breath sounds bilaterally, no wheezing, rales,rhonchi or crepitation. No use of accessory muscles of respiration.  CARDIOVASCULAR: S1, S2 normal. No murmurs, rubs, or gallops.  ABDOMEN: Soft, non-tender, non-distended. Bowel sounds present. No organomegaly or mass.  EXTREMITIES: No pedal edema, cyanosis, or clubbing.   NEUROLOGIC: Cranial nerves II through XII are intact. Muscle strength 5/5 in all extremities. Sensation intact. Gait not checked.  PSYCHIATRIC: The patient is alert and oriented x 3.  SKIN: No obvious rash, lesion, or ulcer.   DATA REVIEW:   CBC  Recent Labs Lab 05/22/15 0513 05/22/15 1348  WBC 4.6  --   HGB 8.7*  --   HCT 26.7*  --   PLT 99* 122*    Chemistries   Recent Labs Lab 05/22/15 0513  NA 138  K 3.6  CL 107  CO2 26  GLUCOSE 127*  BUN 7  CREATININE 0.58  CALCIUM 8.0*  AST 58*  ALT 31  ALKPHOS 68  BILITOT 0.3    Cardiac Enzymes No results for input(s): TROPONINI in the last 168 hours.  Microbiology Results  Results for orders placed or performed during the hospital encounter of 05/21/15  Gram stain     Status: None (Preliminary result)   Collection Time: 05/21/15  4:43 PM  Result Value Ref Range Status   Specimen Description PERITONEAL  Final   Special Requests NONE  Final   Gram Stain RARE WBC SEEN NO ORGANISMS SEEN   Final   Report Status PENDING  Incomplete    RADIOLOGY:  Dg Chest 2 View  05/21/2015  CLINICAL DATA:  Shortness of breath beginning 3 days ago. EXAM: CHEST  2 VIEW COMPARISON:  09/23/2013 FINDINGS: Heart size is normal. Mediastinal shadows are normal. The patient has developed mild atelectasis in both lower lungs with small pleural effusions, larger on the right than the left. No evidence of dense consolidation or lobar collapse. No acute bone finding. Previous thoracolumbar spinal fusion. IMPRESSION: Development of mild atelectasis at both lung bases with small effusions, right larger than left. Electronically Signed   By: Nelson Chimes M.D.   On: 05/21/2015 12:34   US Abdomen Limited  05/21/2015  CLINICAL DATA:  Abdominal distention.  Hepatic cirrhosis. EXAM: LIMITED ABDOMEN ULTRASOUND FOR ASCITES TECHNIQUE: Limited ultrasound survey for ascites was performed in all four abdominal quadrants. COMPARISON:  May 06, 2015 FINDINGS:  There is moderate ascites throughout the abdomen and pelvis. Liver has a nodular contour consistent with known cirrhosis. IMPRESSION: Moderate generalized ascites. Electronically Signed   By: Lowella Grip III M.D.   On: 05/21/2015 11:35   US Paracentesis  05/21/2015  INDICATION: Hepatitis-C and alcoholic cirrhosis.  Ascites. EXAM: ULTRASOUND GUIDED PARACENTESIS MEDICATIONS: None. COMPLICATIONS: None immediate. PROCEDURE: Informed written consent was obtained from the patient after a discussion of the risks, benefits and alternatives to treatment. A timeout was performed prior to the initiation of the procedure. Initial ultrasound scanning demonstrates a large amount of ascites within the left lower  abdominal quadrant. The left lower abdomen was prepped and draped in the usual sterile fashion. 1% lidocaine was used for local anesthesia. Following this, a Safe-T-Centesis catheter catheter was introduced. An ultrasound image was saved for documentation purposes. The paracentesis was performed. The catheter was removed and a dressing was applied. The patient tolerated the procedure well without immediate post procedural complication. FINDINGS: A total of approximately 4.4 L of yellow fluid was removed. Samples were sent to the laboratory as requested by the clinical team. IMPRESSION: Successful ultrasound-guided paracentesis yielding 4.4 liters of peritoneal fluid. Electronically Signed   By: Markus Daft M.D.   On: 05/21/2015 18:01    EKG:   Orders placed or performed in visit on 01/06/14  . EKG 12-Lead      Management plans discussed with the patient, family and they are in agreement.  CODE STATUS:     Code Status Orders        Start     Ordered   05/21/15 1500  Full code   Continuous     05/21/15 1459    Code Status History    Date Active Date Inactive Code Status Order ID Comments User Context   11/29/2013 12:02 PM 11/30/2013  3:31 AM Full Code [id number]  Logan Bores, MD [facility]       TOTAL TIME TAKING CARE OF THIS PATIE40 minutes.    Theodoro Grist M.D on 05/22/2015 at 4:47 PM  Between 7am to 6pm - Pager - [phone]  After 6pm go to [email or web address] - password [email or web address] [facility]  Office  [phone]  CC: Primary care physician; Kirk Ruths., MD

## 2015-05-22 NOTE — Progress Notes (Signed)
We removed foley this am in anticipation of pt going home.  Pt voided 50 cc.  After several hours and no more urine, we bladder scanned pt and 445 cc showed.  Called the dr and got order to do an in/out cath.  We were only able to pull 100 cc.  We sat pt on BSC hoping gravity would help.  Did not void any further.  Called the dr and explained what had transpired.  She believes fluid showing up on bladder scan isn't in the bladder but in the pelvic area.  Pt had paracentesis yesterday and 4.4L was pulled off.  Pt still has some distention.  Dr believes that ascites can be causing pressure on the bladder and sensation that she needs to void.  She is to f/u w/PCP and urologist. Since the paracentesis, pt's BP has been low.  We held the BP meds this am and also held the furosemide and spironalactone.  Was told by dr that pt isn't supposed to restart her lasix and sprionalactone until systolic BP > 023.  Explained this to pt and her husband.  He states that they have BP cuff at home.  BP meds also suspended until she sees her PCP.  IV removed.  Will review d/c instructions and f/u appts.  Pt leaving w/husband and son.

## 2015-05-23 LAB — CYTOLOGY - NON PAP

## 2015-05-26 ENCOUNTER — Ambulatory Visit (INDEPENDENT_AMBULATORY_CARE_PROVIDER_SITE_OTHER): Payer: Medicare Other | Admitting: Urology

## 2015-05-26 ENCOUNTER — Encounter: Payer: Self-pay | Admitting: *Deleted

## 2015-05-26 ENCOUNTER — Inpatient Hospital Stay
Admission: EM | Admit: 2015-05-26 | Discharge: 2015-05-28 | DRG: 433 | Disposition: A | Discharge status: Home / Self Care | Payer: Medicare Other | Attending: Specialist | Admitting: Specialist

## 2015-05-26 VITALS — BP 121/79 | HR 91 | Ht 72.0 in | Wt 180.8 lb

## 2015-05-26 DIAGNOSIS — M5136 Other intervertebral disc degeneration, lumbar region: Secondary | ICD-10-CM | POA: Diagnosis present

## 2015-05-26 DIAGNOSIS — Z9889 Other specified postprocedural states: Secondary | ICD-10-CM

## 2015-05-26 DIAGNOSIS — F1010 Alcohol abuse, uncomplicated: Secondary | ICD-10-CM | POA: Diagnosis present

## 2015-05-26 DIAGNOSIS — K219 Gastro-esophageal reflux disease without esophagitis: Secondary | ICD-10-CM | POA: Diagnosis present

## 2015-05-26 DIAGNOSIS — D696 Thrombocytopenia, unspecified: Secondary | ICD-10-CM | POA: Diagnosis present

## 2015-05-26 DIAGNOSIS — Z79899 Other long term (current) drug therapy: Secondary | ICD-10-CM

## 2015-05-26 DIAGNOSIS — R140 Abdominal distension (gaseous): Secondary | ICD-10-CM | POA: Diagnosis present

## 2015-05-26 DIAGNOSIS — D638 Anemia in other chronic diseases classified elsewhere: Secondary | ICD-10-CM | POA: Diagnosis present

## 2015-05-26 DIAGNOSIS — Z8249 Family history of ischemic heart disease and other diseases of the circulatory system: Secondary | ICD-10-CM

## 2015-05-26 DIAGNOSIS — R188 Other ascites: Secondary | ICD-10-CM

## 2015-05-26 DIAGNOSIS — R300 Dysuria: Secondary | ICD-10-CM | POA: Diagnosis not present

## 2015-05-26 DIAGNOSIS — Z823 Family history of stroke: Secondary | ICD-10-CM | POA: Diagnosis not present

## 2015-05-26 DIAGNOSIS — M545 Low back pain: Secondary | ICD-10-CM | POA: Diagnosis present

## 2015-05-26 DIAGNOSIS — M797 Fibromyalgia: Secondary | ICD-10-CM | POA: Diagnosis present

## 2015-05-26 DIAGNOSIS — G2581 Restless legs syndrome: Secondary | ICD-10-CM | POA: Diagnosis present

## 2015-05-26 DIAGNOSIS — B182 Chronic viral hepatitis C: Secondary | ICD-10-CM | POA: Diagnosis present

## 2015-05-26 DIAGNOSIS — K7031 Alcoholic cirrhosis of liver with ascites: Secondary | ICD-10-CM

## 2015-05-26 DIAGNOSIS — F331 Major depressive disorder, recurrent, moderate: Secondary | ICD-10-CM | POA: Diagnosis present

## 2015-05-26 DIAGNOSIS — N200 Calculus of kidney: Secondary | ICD-10-CM | POA: Diagnosis not present

## 2015-05-26 DIAGNOSIS — R1084 Generalized abdominal pain: Secondary | ICD-10-CM | POA: Diagnosis present

## 2015-05-26 DIAGNOSIS — F17210 Nicotine dependence, cigarettes, uncomplicated: Secondary | ICD-10-CM | POA: Diagnosis present

## 2015-05-26 DIAGNOSIS — R39198 Other difficulties with micturition: Secondary | ICD-10-CM | POA: Diagnosis not present

## 2015-05-26 DIAGNOSIS — G8929 Other chronic pain: Secondary | ICD-10-CM | POA: Diagnosis present

## 2015-05-26 DIAGNOSIS — R1013 Epigastric pain: Secondary | ICD-10-CM

## 2015-05-26 DIAGNOSIS — Z9851 Tubal ligation status: Secondary | ICD-10-CM

## 2015-05-26 DIAGNOSIS — I10 Essential (primary) hypertension: Secondary | ICD-10-CM | POA: Diagnosis present

## 2015-05-26 LAB — LIPASE, BLOOD: Lipase: 40 U/L (ref 11–51)

## 2015-05-26 LAB — CBC
HCT: 31.2 % — ABNORMAL LOW (ref 35.0–47.0)
Hemoglobin: 9.8 g/dL — ABNORMAL LOW (ref 12.0–16.0)
MCH: 29.7 pg (ref 26.0–34.0)
MCHC: 31.3 g/dL — ABNORMAL LOW (ref 32.0–36.0)
MCV: 94.9 fL (ref 80.0–100.0)
Platelets: 116 10*3/uL — ABNORMAL LOW (ref 150–440)
RBC: 3.29 MIL/uL — ABNORMAL LOW (ref 3.80–5.20)
RDW: 20 % — ABNORMAL HIGH (ref 11.5–14.5)
WBC: 5.3 10*3/uL (ref 3.6–11.0)

## 2015-05-26 LAB — COMPREHENSIVE METABOLIC PANEL
ALT: 38 U/L (ref 14–54)
AST: 64 U/L — ABNORMAL HIGH (ref 15–41)
Albumin: 2.5 g/dL — ABNORMAL LOW (ref 3.5–5.0)
Alkaline Phosphatase: 91 U/L (ref 38–126)
Anion gap: 3 — ABNORMAL LOW (ref 5–15)
BUN: 5 mg/dL — ABNORMAL LOW (ref 6–20)
CO2: 23 mmol/L (ref 22–32)
Calcium: 7.7 mg/dL — ABNORMAL LOW (ref 8.9–10.3)
Chloride: 114 mmol/L — ABNORMAL HIGH (ref 101–111)
Creatinine, Ser: 0.59 mg/dL (ref 0.44–1.00)
GFR calc Af Amer: >60 mL/min (ref >60)
GFR calc non Af Amer: >60 mL/min (ref >60)
Glucose, Bld: 176 mg/dL — ABNORMAL HIGH (ref 65–99)
Potassium: 3.6 mmol/L (ref 3.5–5.1)
Sodium: 140 mmol/L (ref 135–145)
Total Bilirubin: 0.7 mg/dL (ref 0.3–1.2)
Total Protein: 6 g/dL — ABNORMAL LOW (ref 6.5–8.1)

## 2015-05-26 LAB — URINALYSIS COMPLETE WITH MICROSCOPIC (ARMC ONLY)
Bacteria, UA: NONE SEEN
Bilirubin Urine: NEGATIVE
Glucose, UA: NEGATIVE mg/dL
Hgb urine dipstick: NEGATIVE
Ketones, ur: NEGATIVE mg/dL
Leukocytes, UA: NEGATIVE
Nitrite: NEGATIVE
Protein, ur: NEGATIVE mg/dL
Specific Gravity, Urine: 1.017 (ref 1.005–1.030)
pH: 5 (ref 5.0–8.0)

## 2015-05-26 LAB — BLADDER SCAN AMB NON-IMAGING: Scan Result: 742

## 2015-05-26 LAB — MAGNESIUM: Magnesium: 1.5 mg/dL — ABNORMAL LOW (ref 1.7–2.4)

## 2015-05-26 MED ORDER — OXYCODONE HCL 5 MG PO TABS: 5.0000 mg | ORAL_TABLET | ORAL | Status: DC | PRN

## 2015-05-26 MED ORDER — ONDANSETRON HCL 4 MG PO TABS: 4.0000 mg | ORAL_TABLET | Freq: Four times a day (QID) | ORAL | Status: DC | PRN

## 2015-05-26 MED ORDER — SODIUM CHLORIDE 0.9 % IV SOLN: 250.0000 mL | INTRAVENOUS | Status: DC | PRN

## 2015-05-26 MED ORDER — SODIUM CHLORIDE 0.9% FLUSH
3.0000 mL | Freq: Two times a day (BID) | INTRAVENOUS | Status: DC
  Administered 2015-05-26 – 2015-05-27 (×3): 3 mL via INTRAVENOUS

## 2015-05-26 MED ORDER — HEPARIN SODIUM (PORCINE) 5000 UNIT/ML IJ SOLN
5000.0000 [IU] | Freq: Three times a day (TID) | INTRAMUSCULAR | Status: DC
Start: 2015-05-26 — End: 2015-05-27
  Administered 2015-05-26: 5000 [IU] via SUBCUTANEOUS
  Filled 2015-05-26 (×3): qty 1

## 2015-05-26 MED ORDER — SODIUM CHLORIDE 0.9% FLUSH: 3.0000 mL | INTRAVENOUS | Status: DC | PRN

## 2015-05-26 MED ORDER — ALBUTEROL SULFATE (2.5 MG/3ML) 0.083% IN NEBU
2.5000 mg | INHALATION_SOLUTION | RESPIRATORY_TRACT | Status: DC | PRN
Start: 2015-05-26 — End: 2015-05-28

## 2015-05-26 MED ORDER — MORPHINE SULFATE (PF) 2 MG/ML IV SOLN
INTRAVENOUS | Status: AC
  Administered 2015-05-26: 2 mg via INTRAVENOUS
  Filled 2015-05-26: qty 1

## 2015-05-26 MED ORDER — ADULT MULTIVITAMIN W/MINERALS CH
1.0000 | ORAL_TABLET | Freq: Every day | ORAL | Status: DC
  Filled 2015-05-26 (×2): qty 1

## 2015-05-26 MED ORDER — ACETAMINOPHEN 650 MG RE SUPP: 650.0000 mg | Freq: Four times a day (QID) | RECTAL | Status: DC | PRN

## 2015-05-26 MED ORDER — OXYCODONE HCL 5 MG PO TABS
20.0000 mg | ORAL_TABLET | Freq: Four times a day (QID) | ORAL | Status: DC | PRN
  Administered 2015-05-26: 20 mg via ORAL
  Filled 2015-05-26 (×2): qty 4

## 2015-05-26 MED ORDER — MORPHINE SULFATE (PF) 4 MG/ML IV SOLN
4.0000 mg | INTRAVENOUS | Status: DC | PRN
  Administered 2015-05-26 – 2015-05-27 (×2): 4 mg via INTRAVENOUS
  Filled 2015-05-26 (×2): qty 1

## 2015-05-26 MED ORDER — ONDANSETRON HCL 4 MG/2ML IJ SOLN: 4.0000 mg | Freq: Four times a day (QID) | INTRAMUSCULAR | Status: DC | PRN

## 2015-05-26 MED ORDER — ACETAMINOPHEN 325 MG PO TABS
650.0000 mg | ORAL_TABLET | Freq: Four times a day (QID) | ORAL | Status: DC | PRN
Start: 2015-05-26 — End: 2015-05-28

## 2015-05-26 MED ORDER — MORPHINE SULFATE (PF) 2 MG/ML IV SOLN
2.0000 mg | Freq: Once | INTRAVENOUS | Status: AC
  Administered 2015-05-26: 2 mg via INTRAVENOUS

## 2015-05-26 MED ORDER — MAGNESIUM GLUCONATE 500 MG PO TABS: 500.0000 mg | ORAL_TABLET | Freq: Every day | ORAL | Status: DC

## 2015-05-26 MED ORDER — FUROSEMIDE 40 MG PO TABS
40.0000 mg | ORAL_TABLET | Freq: Every day | ORAL | Status: DC
  Administered 2015-05-27: 40 mg via ORAL
  Filled 2015-05-26 (×2): qty 1

## 2015-05-26 MED ORDER — SPIRONOLACTONE 25 MG PO TABS
25.0000 mg | ORAL_TABLET | Freq: Every day | ORAL | Status: DC
  Administered 2015-05-26 – 2015-05-27 (×2): 25 mg via ORAL
  Filled 2015-05-26 (×2): qty 1

## 2015-05-26 NOTE — ED Notes (Signed)
Pt arrives with abd distention and swelling, states hx of liver cirrhosis, states she has had fluid drawn off her abd before, states last drink was 2 weeks ago

## 2015-05-26 NOTE — ED Notes (Signed)
Patient unable to void at this time

## 2015-05-26 NOTE — ED Notes (Signed)
Dietary called for '2mg'$  Sodium meal. Meal has arrived and given to patient. Patient is sitting on bench in room, talking on the phone.

## 2015-05-26 NOTE — ED Notes (Signed)
Pt came from urology office today, states they tried to cath her but they did not find much urine, pt states she has only urinated a few drops today

## 2015-05-26 NOTE — Progress Notes (Signed)
05/26/2015 3:28 PM   Audrey Garrett 1960/06/02 [id number]  Referring provider: Kirk Ruths, MD [facility] [place], [place] [address]  Chief Complaint  Patient presents with  . Trouble urinating    referred by Dr. Ouida Sills    HPI: Patient is a 55 year old Caucasian female with ascites due to cirrhosis who is referred to [place] by her PCP, Dr. Ouida Sills, for trouble urinating.    Patient states that she has not urinated for three days.  She feels her abdomen is bloated.  She was just released from the hospital after she had underwent a paracentesis for abdominal ascites.  Her serum creatinine is 0.58.  Her PVR on today's visit was 742 mL, but I am fearful that this is ascites and not urine.    She is also very worried about a kidney stone.  She read on Google that a kidney stone would stop urination.  I explained to her that she did have small obstructing bilateral renal calculi found on her CT in 12/2014, but they would not be causing her not to urinate.    She was unable to give a urine specimen today.  Her baseline urinating symptoms are hesitancy, intermittency, straining to urinate and a weak urinary stream.       PMH: Past Medical History  Diagnosis Date  . Depression   . Anxiety   . High risk medications (not anticoagulants) long-term use   . Vitamin D deficiency disease   . Lumbago   . Hypertension   . Chronic hepatitis C ([place])   . Alcohol abuse   . Bronchitis   . Arthropathy   . Back pain   . Shoulder pain   . Stress headaches   . Hyperglycemia   . SOB (shortness of breath)   . Muscle spasm   . Edema   . Cirrhosis ([place])   . Polyarthritis   . Fatigue   . Sinusitis   . RLS (restless legs syndrome)   . Alcoholic cirrhosis of liver without ascites ([place])   . Reflux   . Fibromyalgia   . Spine disorder   . Chronic LBP 12/25/2014    Overview:  Post extensive surgery   . DDD (degenerative disc disease), lumbar 01/01/2015  .  Lumbar radicular pain 01/01/2015  . Neck pain 01/01/2015  . Left leg pain 01/01/2015  . Sacroiliac pain 01/01/2015  . Upper back pain 01/01/2015  . Urinary frequency     Surgical History: Past Surgical History  Procedure Laterality Date  . Tubal ligation    . Tonsillectomy    . Back surgery  12/19/2011  . Esophagogastroduodenoscopy N/A 04/14/2015    Procedure: ESOPHAGOGASTRODUODENOSCOPY (EGD);  Surgeon: Lollie Sails, MD;  Location: [facility] ENDOSCOPY;  Service: Endoscopy;  Laterality: N/A;    Home Medications:    Medication List       This list is accurate as of: 05/26/15  3:28 PM.  Always use your most recent med list.               busPIRone 15 MG tablet  Commonly known as:  BUSPAR  Take by mouth. Reported on 05/26/2015     clonazePAM 1 MG tablet  Commonly known as:  KLONOPIN  Take 1 mg by mouth daily. Reported on 05/26/2015     escitalopram 20 MG tablet  Commonly known as:  LEXAPRO  Take 20 mg by mouth daily. Reported on 05/26/2015  furosemide 40 MG tablet  Commonly known as:  LASIX  Take 40 mg by mouth daily.     lisinopril-hydrochlorothiazide 20-12.5 MG tablet  Commonly known as:  PRINZIDE,ZESTORETIC  Take by mouth.     Magnesium 200 MG Tabs  Take 1 tablet by mouth daily.     Magnesium Oxide 500 MG Caps  Take 1 capsule (500 mg total) by mouth 2 (two) times daily as needed.     multivitamin with minerals tablet  Take 1 tablet by mouth daily.     Oxycodone HCl 20 MG Tabs  Take 1 tablet (20 mg total) by mouth every 6 (six) hours as needed.     pantoprazole 40 MG tablet  Commonly known as:  PROTONIX  Take 40 mg by mouth 2 (two) times daily.     pregabalin 25 MG capsule  Commonly known as:  LYRICA  Take 1 capsule (25 mg total) by mouth 2 (two) times daily.     spironolactone 25 MG tablet  Commonly known as:  ALDACTONE  Take 25 mg by mouth daily. Reported on 05/26/2015     traMADol 100 MG 24 hr tablet  Commonly known as:  ULTRAM-ER  Take 100 mg by  mouth daily as needed for pain.        Allergies: No Known Allergies  Family History: Family History  Problem Relation Age of Onset  . Stroke Mother   . Heart disease Mother   . [name] Father   . Kidney disease Neg Hx   . Bladder Cancer Neg Hx     Social History:  reports that she has been smoking Cigarettes.  She has a 30 pack-year smoking history. She does not have any smokeless tobacco history on file. She reports that she drinks alcohol. She reports that she does not use illicit drugs.  ROS: UROLOGY Frequent Urination?: No Hard to postpone urination?: No Burning/pain with urination?: No Get up at night to urinate?: No Leakage of urine?: No Urine stream starts and stops?: Yes Trouble starting stream?: Yes Do you have to strain to urinate?: Yes Blood in urine?: No Urinary tract infection?: No Sexually transmitted disease?: No Injury to kidneys or bladder?: No Painful intercourse?: No Weak stream?: Yes Currently pregnant?: No Vaginal bleeding?: No Last menstrual period?: n  Gastrointestinal Nausea?: No Vomiting?: No Indigestion/heartburn?: No Diarrhea?: No Constipation?: Yes  Constitutional Fever: No Night sweats?: No Weight loss?: No Fatigue?: Yes  Skin Skin rash/lesions?: No Itching?: Yes  Eyes Blurred vision?: Yes Double vision?: No  Ears/Nose/Throat Sore throat?: No Sinus problems?: No  Hematologic/Lymphatic Swollen glands?: No Easy bruising?: Yes  Cardiovascular Leg swelling?: Yes Chest pain?: Yes  Respiratory Cough?: No Shortness of breath?: Yes  Endocrine Excessive thirst?: Yes  Musculoskeletal Back pain?: Yes Joint pain?: No  Neurological Headaches?: Yes Dizziness?: Yes  Psychologic Depression?: Yes Anxiety?: Yes  Physical Exam: BP 121/79 mmHg  Pulse 91  Ht 6' (1.829 m)  Wt 180 lb 12.8 oz (82.01 kg)  BMI 24.52 kg/m2  Constitutional: Well nourished. Alert and oriented, No acute distress. HEENT: [place] AT, moist  mucus membranes. Trachea midline, no masses. Cardiovascular: No clubbing, cyanosis, or edema. Respiratory: Normal respiratory effort, no increased work of breathing. GI: Abdomen is distended and tender.     GU: No CVA tenderness.  No bladder fullness or masses.   Skin: No rashes, bruises or suspicious lesions. Lymph: No cervical or inguinal adenopathy. Neurologic: Grossly intact, no focal deficits, moving all 4 extremities. Psychiatric: Normal mood and affect.  Laboratory Data: Lab Results  Component Value Date   WBC 4.6 05/22/2015   HGB 8.7* 05/22/2015   HCT 26.7* 05/22/2015   MCV 94.2 05/22/2015   PLT 122* 05/22/2015    Lab Results  Component Value Date   CREATININE 0.58 05/22/2015    Lab Results  Component Value Date   HGBA1C 5.3 05/22/2015    Lab Results  Component Value Date   TSH 1.72 10/12/2013       Component Value Date/Time   CHOL 113 10/12/2013 1157   HDL 7* 10/12/2013 1157   VLDL 38 10/12/2013 1157   LDLCALC 68 10/12/2013 1157    Lab Results  Component Value Date   AST 58* 05/22/2015   Lab Results  Component Value Date   ALT 31 05/22/2015    Simple Catheter Placement  Patient was cleaned and prepped in a sterile fashion with betadine and lidocaine jelly 2% was instilled into the urethra.  A 14 FR foley catheter was inserted, urine return was noted  70 ml, urine was yellow in color.  The balloon was filled with 10cc of sterile water.  The foley was irrigated and irrigation was easy.  The foley was then removed.     Preformed by: Sullivan Lone.A-C   Assessment & Plan:    1. Difficulty with urination:   Patient did not have a large PVR.  Her difficulty with urination is likely due to the decrease in urine volume due to the ascites.    - Urinalysis, Complete - BLADDER SCAN AMB NON-IMAGING  2. Bilateral renal stones:   No intervention is needed at this time for the stones.  If she has flank pain or gross hematuria, she will need to see [place]  for further management.    3. Ascites:   Patient is advised to seek treatment in the ED for further treatment of the ascites.  4. Cirrhosis:   Patient and her husband are not realistic concerning her condition.  The patient stated that she had stopped drinking alcohol two weeks ago and she thought she would be better.  They do not understand the sequela of cirrhosis and why the ascites returns after it is drained.  I explained the pathophysiology of the cirrhosis and ascites and stated it was a grave condition.  The patient broke down and stated she could not go on living like this.  Her husband was upset with this realization.  I advised they seek treatment in the ED for the ascites and also to seek psychiatric care or pastoral care to help her with her diagnosis.      Return for patient instructed to go to the ED.  These notes generated with voice recognition software. I apologize for typographical errors.  Zara Council, [facility] [address], [place], [place] [address] [phone]

## 2015-05-26 NOTE — H&P (Signed)
[facility] NAME: Audrey Garrett    MR#:  [id number]  DATE OF BIRTH:  [birth year]  DATE OF ADMISSION:  05/26/2015  PRIMARY CARE PHYSICIAN: Kirk Ruths., MD   REQUESTING/REFERRING PHYSICIAN: Lavonia Drafts, MD  CHIEF COMPLAINT:   Chief Complaint  Patient presents with  . Abdominal Pain   Worsening abdominal distention for 3 days. HISTORY OF PRESENT ILLNESS:  Audrey Garrett  is a 55 y.o. female with a known history of liver cirrhosis, ascites, hypertension, I'll call abuse and chronic hepatitis C. The patient was just discharged one week ago after paracentesis. She has had a worsening abdominal distention for the past 3 days. In addition, she feels abdominal tightness and dyspnea. She denies any fever or chills, no nausea or vomiting or diarrhea. No melena no bloody stool. She complained of urinary retention but only get 70 mL urine with catheter. She said that she has less urine for the past few days despite water pills. She also complains of worsening leg edema.  PAST MEDICAL HISTORY:   Past Medical History  Diagnosis Date  . Depression   . Anxiety   . High risk medications (not anticoagulants) long-term use   . Vitamin D deficiency disease   . Lumbago   . Hypertension   . Chronic hepatitis C ([place])   . Alcohol abuse   . Bronchitis   . Arthropathy   . Back pain   . Shoulder pain   . Stress headaches   . Hyperglycemia   . SOB (shortness of breath)   . Muscle spasm   . Edema   . Cirrhosis ([place])   . Polyarthritis   . Fatigue   . Sinusitis   . RLS (restless legs syndrome)   . Alcoholic cirrhosis of liver without ascites ([place])   . Reflux   . Fibromyalgia   . Spine disorder   . Chronic LBP 12/25/2014    Overview:  Post extensive surgery   . DDD (degenerative disc disease), lumbar 01/01/2015  . Lumbar radicular pain 01/01/2015  . Neck pain 01/01/2015  . Left leg pain 01/01/2015  . Sacroiliac pain 01/01/2015  .  Upper back pain 01/01/2015  . Urinary frequency     PAST SURGICAL HISTORY:   Past Surgical History  Procedure Laterality Date  . Tubal ligation    . Tonsillectomy    . Back surgery  12/19/2011  . Esophagogastroduodenoscopy N/A 04/14/2015    Procedure: ESOPHAGOGASTRODUODENOSCOPY (EGD);  Surgeon: Lollie Sails, MD;  Location: [facility] ENDOSCOPY;  Service: Endoscopy;  Laterality: N/A;    SOCIAL HISTORY:   Social History  Substance Use Topics  . Smoking status: Current Every Day Smoker -- 1.00 packs/day for 30 years    Types: Cigarettes  . Smokeless tobacco: Not on file  . Alcohol Use: 0.0 oz/week    0 Standard drinks or equivalent per week     Comment: but 10 days post stopping    FAMILY HISTORY:   Family History  Problem Relation Age of Onset  . Stroke Mother   . Heart disease Mother   . [name] Father   . Kidney disease Neg Hx   . Bladder Cancer Neg Hx     DRUG ALLERGIES:  No Known Allergies  REVIEW OF SYSTEMS:  CONSTITUTIONAL: No fever, has generalized weakness. Increased body weight. EYES: No blurred or double vision.  EARS, NOSE, AND THROAT: No tinnitus or ear pain.  RESPIRATORY: No cough, but has shortness of  breath, no wheezing or hemoptysis.  CARDIOVASCULAR: No chest pain, orthopnea, worsening leg edema.  GASTROINTESTINAL: No nausea, vomiting, diarrhea or abdominal pain. But has abdominal distention. No melena or bloody stool. GENITOURINARY: No dysuria, hematuria. But has less urine output. ENDOCRINE: No polyuria, nocturia,  HEMATOLOGY: No anemia, easy bruising or bleeding SKIN: No rash or lesion. MUSCULOSKELETAL: No joint pain or arthritis.   NEUROLOGIC: No tingling, numbness, weakness.  PSYCHIATRY: No anxiety or depression.   MEDICATIONS AT HOME:   Prior to Admission medications   Medication Sig Start Date End Date Taking? Authorizing Provider  furosemide (LASIX) 40 MG tablet Take 40 mg by mouth daily.    Yes Historical Provider, MD  magnesium  gluconate (MAGONATE) 500 MG tablet Take 500 mg by mouth daily.   Yes Historical Provider, MD  Multiple Vitamins-Minerals (MULTIVITAMIN WITH MINERALS) tablet Take 1 tablet by mouth daily.   Yes Historical Provider, MD  Oxycodone HCl 20 MG TABS Take 20 mg by mouth every 6 (six) hours as needed (for pain).   Yes Historical Provider, MD  spironolactone (ALDACTONE) 25 MG tablet Take 25 mg by mouth daily.    Yes Historical Provider, MD  pregabalin (LYRICA) 25 MG capsule Take 1 capsule (25 mg total) by mouth 2 (two) times daily. Patient not taking: Reported on 05/26/2015 05/22/15   Theodoro Grist, MD      VITAL SIGNS:  Blood pressure 109/68, pulse 73, temperature 98.9 F (37.2 C), temperature source Oral, resp. rate 18, height 6' (1.829 m), weight 81.647 kg (180 lb), SpO2 100 %.  PHYSICAL EXAMINATION:  GENERAL:  55 y.o.-year-old patient lying in the bed with no acute distress.  EYES: Pupils equal, round, reactive to light and accommodation. No scleral icterus. Extraocular muscles intact.  HEENT: Head atraumatic, normocephalic. Oropharynx and nasopharynx clear.  NECK:  Supple, no jugular venous distention. No thyroid enlargement, no tenderness.  LUNGS: Normal breath sounds bilaterally, no wheezing, rales,rhonchi or crepitation. No use of accessory muscles of respiration.  CARDIOVASCULAR: S1, S2 normal. No murmurs, rubs, or gallops.  ABDOMEN: Tight, mild tenderness, highly distended. Bowel sounds present. Unable to estimate organomegaly or mass. Positive ascites signs. EXTREMITIES: Bilateral lower extremity edema 2+, no cyanosis, or clubbing.  NEUROLOGIC: Cranial nerves II through XII are intact. Muscle strength 5/5 in all extremities. Sensation intact. Gait not checked.  PSYCHIATRIC: The patient is alert and oriented x 3.  SKIN: No obvious rash, lesion, or ulcer.   LABORATORY PANEL:   CBC  Recent Labs Lab 05/26/15 1526  WBC 5.3  HGB 9.8*  HCT 31.2*  PLT 116*    ------------------------------------------------------------------------------------------------------------------  Chemistries   Recent Labs Lab 05/26/15 1526  NA 140  K 3.6  CL 114*  CO2 23  GLUCOSE 176*  BUN 5*  CREATININE 0.59  CALCIUM 7.7*  AST 64*  ALT 38  ALKPHOS 91  BILITOT 0.7   ------------------------------------------------------------------------------------------------------------------  Cardiac Enzymes No results for input(s): TROPONINI in the last 168 hours. ------------------------------------------------------------------------------------------------------------------  RADIOLOGY:  No results found.  EKG:   Orders placed or performed during the hospital encounter of 05/26/15  . EKG 12-Lead  . EKG 12-Lead    IMPRESSION AND PLAN:   Large ascites with Liver cirrhosis Check PT and INR, ultrasound-guided paracentesis tomorrow. The patient developed hypotension after paracentesis with 4 L fluid withdrawal last time. The patient may need multiple paracentesis with less amount of fluid withdrawal to prevent hypotension. Continue Lasix and spironolactone.  Anemia of chronic disease. Stable. Thrombocytopenia. Stable. Tobacco abuse. Smoking cessation  was counseled for 3 minutes.   All the records are reviewed and case discussed with ED provider. Management plans discussed with the patient, her husband and they are in agreement.  CODE STATUS: Full code  TOTAL TIME TAKING CARE OF THIS PATIENT: 55 minutes.    Demetrios Loll M.D on 05/26/2015 at 7:06 PM  Between 7am to 6pm - Pager - [phone]  After 6pm go to [email or web address] - password [email or web address] [facility]  Office  [phone]  CC: Primary care physician; Kirk Ruths., MD

## 2015-05-26 NOTE — ED Provider Notes (Signed)
[facility] Emergency Department Provider Note  ____________________________________________    I have reviewed the triage vital signs and the nursing notes.   HISTORY  Chief Complaint Urinary retention   HPI Audrey Garrett is a 55 y.o. female with a history of alcohol abuse and cirrhosis who presents with decreased urine output. Patient felt that she was having urinary retention and went to the urologist today but they cathetered her and only 13 mL's was in her bladder. Patient reports her abdomen is beginning to distend again and it is very uncomfortable. She recently had several liters of fluid drawn off while in the hospital. She does complain of being thirsty frequently. No fevers or chills     Past Medical History  Diagnosis Date  . Depression   . Anxiety   . High risk medications (not anticoagulants) long-term use   . Vitamin D deficiency disease   . Lumbago   . Hypertension   . Chronic hepatitis C ([place])   . Alcohol abuse   . Bronchitis   . Arthropathy   . Back pain   . Shoulder pain   . Stress headaches   . Hyperglycemia   . SOB (shortness of breath)   . Muscle spasm   . Edema   . Cirrhosis ([place])   . Polyarthritis   . Fatigue   . Sinusitis   . RLS (restless legs syndrome)   . Alcoholic cirrhosis of liver without ascites ([place])   . Reflux   . Fibromyalgia   . Spine disorder   . Chronic LBP 12/25/2014    Overview:  Post extensive surgery   . DDD (degenerative disc disease), lumbar 01/01/2015  . Lumbar radicular pain 01/01/2015  . Neck pain 01/01/2015  . Left leg pain 01/01/2015  . Sacroiliac pain 01/01/2015  . Upper back pain 01/01/2015  . Urinary frequency     Patient Active Problem List   Diagnosis Date Noted  . Difficulty urinating 05/26/2015  . Nephrolithiasis 05/26/2015  . Alcoholic cirrhosis of liver with ascites ([place]) 05/26/2015  . Liver cirrhosis, alcoholic ([place]) 41/93/7902  . Hepatitis C 05/22/2015  . Hypotension 05/22/2015   . Anemia 05/22/2015  . Thrombocytopenia ([place]) 05/22/2015  . Coagulopathy ([place]) 05/22/2015  . Hyperglycemia 05/22/2015  . Polyneuropathy ([place]) 05/22/2015  . Urinary retention 05/22/2015  . Ascites 05/21/2015  . Hypomagnesemia (low magnesium levels) 04/07/2015  . Chronic pain 04/03/2015  . At risk for osteopenia 04/03/2015  . Chronic low back pain (Bilateral) 04/03/2015  . Lumbar spondylosis 04/03/2015  . Chronic lumbar radicular pain (Left) 04/03/2015  . Chronic neck pain 04/03/2015  . Chronic lower extremity pain (Left) 04/03/2015  . Chronic sacroiliac joint pain 04/03/2015  . Chronic upper back pain 04/03/2015  . Long term current use of opiate analgesic 04/03/2015  . Encounter for chronic pain management 04/03/2015  . Bilateral low back pain with left-sided sciatica 01/01/2015  . Chronic pain syndrome 01/01/2015  . Cervical radicular pain 01/01/2015  . Other long term (current) drug therapy 01/01/2015  . Uncomplicated opioid dependence ([place]) 01/01/2015  . Long term prescription opiate use 01/01/2015  . Lumbar facet arthropathy 01/01/2015  . 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 Lumbar facet syndrome (Bilateral) 01/01/2015  . Epidural fibrosis 01/01/2015  . Osteoarthritis of spine with radiculopathy, lumbar region 01/01/2015  . Pain of paraspinal muscle 01/01/2015  . Low back pain with radiation 01/01/2015  . Opiate use 01/01/2015  . Encounter for therapeutic drug level monitoring 01/01/2015  . Notalgia paresthetica  01/01/2015  . Insomnia, persistent 12/25/2014  . Major depression in remission ([place]) 12/25/2014  . BP (high blood pressure) 12/25/2014  . Encounter for general adult medical examination without abnormal findings 12/25/2014  . HCV (hepatitis C virus) 12/25/2014  . ACUTE SINUSITIS, UNSPECIFIED 11/01/2009  . BRONCHITIS, ACUTE 11/01/2009    Past Surgical History  Procedure Laterality Date  . Tubal ligation    . Tonsillectomy    . Back surgery   12/19/2011  . Esophagogastroduodenoscopy N/A 04/14/2015    Procedure: ESOPHAGOGASTRODUODENOSCOPY (EGD);  Surgeon: Lollie Sails, MD;  Location[facility];  Service: Endoscopy;  Laterality: N/A;    Current Outpatient Rx  Name  Route  Sig  Dispense  Refill  . busPIRone (BUSPAR) 15 MG tablet   Oral   Take by mouth. Reported on 05/26/2015         . clonazePAM (KLONOPIN) 1 MG tablet   Oral   Take 1 mg by mouth daily. Reported on 05/26/2015         . escitalopram (LEXAPRO) 20 MG tablet   Oral   Take 20 mg by mouth daily. Reported on 05/26/2015         . furosemide (LASIX) 40 MG tablet   Oral   Take 40 mg by mouth daily.          Marland Kitchen lisinopril-hydrochlorothiazide (PRINZIDE,ZESTORETIC) 20-12.5 MG tablet   Oral   Take by mouth.         . Magnesium 200 MG TABS   Oral   Take 1 tablet by mouth daily.      3   . Magnesium Oxide 500 MG CAPS   Oral   Take 1 capsule (500 mg total) by mouth 2 (two) times daily as needed. Patient not taking: Reported on 05/26/2015   60 capsule   PRN     Do not place this medication, or any other prescri ...   . Multiple Vitamins-Minerals (MULTIVITAMIN WITH MINERALS) tablet   Oral   Take 1 tablet by mouth daily.         . Oxycodone HCl 20 MG TABS   Oral   Take 1 tablet (20 mg total) by mouth every 6 (six) hours as needed.   120 tablet   0     Do not place this medication, or any other prescri ...   . pantoprazole (PROTONIX) 40 MG tablet   Oral   Take 40 mg by mouth 2 (two) times daily.         . pregabalin (LYRICA) 25 MG capsule   Oral   Take 1 capsule (25 mg total) by mouth 2 (two) times daily. Patient not taking: Reported on 05/26/2015   60 capsule   6   . spironolactone (ALDACTONE) 25 MG tablet   Oral   Take 25 mg by mouth daily. Reported on 05/26/2015         . traMADol (ULTRAM-ER) 100 MG 24 hr tablet   Oral   Take 100 mg by mouth daily as needed for pain.           Allergies Review of patient's allergies  indicates no known allergies.  Family History  Problem Relation Age of Onset  . Stroke Mother   . Heart disease Mother   . [name] Father   . Kidney disease Neg Hx   . Bladder Cancer Neg Hx     Social History Social History  Substance Use Topics  . Smoking status: Current Every Day Smoker --  1.00 packs/day for 30 years    Types: Cigarettes  . Smokeless tobacco: None  . Alcohol Use: 0.0 oz/week    0 Standard drinks or equivalent per week     Comment: but 10 days post stopping    Review of Systems  Constitutional: Negative for fever. Eyes: Negative for visual changes. ENT: Negative for sore throat Cardiovascular: Negative for chest pain. Respiratory: Negative for shortness of breath. Gastrointestinal: No vomiting or diarrhea, brown stools Genitourinary: Negative for dysuria. Musculoskeletal: Negative for back pain. Skin: Negative for rash. Neurological: Negative for headaches Psychiatric: Anxious and depressed, not suicidal    ____________________________________________   PHYSICAL EXAM:  VITAL SIGNS: ED Triage Vitals  Enc Vitals Group     BP 05/26/15 1525 121/89 mmHg     Pulse Rate 05/26/15 1525 82     Resp 05/26/15 1525 18     Temp 05/26/15 1525 98.9 F (37.2 C)     Temp Source 05/26/15 1525 Oral     SpO2 05/26/15 1525 100 %     Weight 05/26/15 1525 180 lb (81.647 kg)     Height 05/26/15 1525 6' (1.829 m)     Head Cir --      Peak Flow --      Pain Score 05/26/15 1525 10     Pain Loc --      Pain Edu? --      Excl. in [place]? --      Constitutional: Alert and oriented. Well appearing and in no distress. Eyes: Conjunctivae are normal.  ENT   Head: Normocephalic and atraumatic.   Mouth/Throat: Mucous membranes are moist. Cardiovascular: Normal rate, regular rhythm. Normal and symmetric distal pulses are present in all extremities.  Respiratory: Normal respiratory effort without tachypnea nor retractions. Breath sounds are clear and equal  bilaterally.  Gastrointestinal: Soft and non-tender in all quadrants. Distended secondary to ascites. There is no CVA tenderness. Genitourinary: deferred Musculoskeletal: Nontender with normal range of motion in all extremities. No lower extremity tenderness nor edema. Neurologic:  Normal speech and language. No gross focal neurologic deficits are appreciated. Skin:  Skin is warm, dry and intact. No rash noted.   ____________________________________________    LABS (pertinent positives/negatives)  Labs Reviewed  COMPREHENSIVE METABOLIC PANEL - Abnormal; Notable for the following:    Chloride 114 (*)    Glucose, Bld 176 (*)    BUN 5 (*)    Calcium 7.7 (*)    Total Protein 6.0 (*)    Albumin 2.5 (*)    AST 64 (*)    Anion gap 3 (*)    All other components within normal limits  CBC - Abnormal; Notable for the following:    RBC 3.29 (*)    Hemoglobin 9.8 (*)    HCT 31.2 (*)    MCHC 31.3 (*)    RDW 20.0 (*)    Platelets 116 (*)    All other components within normal limits  LIPASE, BLOOD  URINALYSIS COMPLETEWITH MICROSCOPIC ([facility] ONLY)    ____________________________________________   EKG  ED ECG REPORT I, Lavonia Drafts, the attending physician, personally viewed and interpreted this ECG.  Date: 05/26/2015 EKG Time: 3:36 PM Rate: 74 Rhythm: normal sinus rhythm QRS Axis: normal Intervals: normal ST/T Wave abnormalities: normal Conduction Disturbances: none Narrative Interpretation: unremarkable   ____________________________________________    RADIOLOGY I have personally reviewed any xrays that were ordered on this patient: None  ____________________________________________   PROCEDURES  Procedure(s) performed: none  Critical Care performed: none  ____________________________________________   INITIAL IMPRESSION / ASSESSMENT AND PLAN / ED COURSE  Pertinent labs & imaging results that were available during my care of the patient were reviewed by  me and considered in my medical decision making (see chart for details).  It is unclear why the patient was referred to the ED from the urology office. Regardless we have checked her kidney function which appears to be normal. I suspect her decreased urine output secondary to her ascites.   Patient with markedly distended abdomen secondary to ascites. This is causing her considerable discomfort and while not making her hypoxic and is making it difficult for her to take a deep breath. I will discuss with hospitalist for admission for paracentesis given that she was hypotensive after last paracentesis   ____________________________________________   FINAL CLINICAL IMPRESSION(S) / ED DIAGNOSES  Final diagnoses:  Ascites  Generalized abdominal pain     Lavonia Drafts, MD 05/26/15 1751

## 2015-05-27 ENCOUNTER — Inpatient Hospital Stay: Payer: Medicare Other

## 2015-05-27 DIAGNOSIS — F331 Major depressive disorder, recurrent, moderate: Secondary | ICD-10-CM

## 2015-05-27 DIAGNOSIS — R1013 Epigastric pain: Secondary | ICD-10-CM

## 2015-05-27 DIAGNOSIS — G8929 Other chronic pain: Secondary | ICD-10-CM

## 2015-05-27 LAB — APTT: aPTT: 43 seconds — ABNORMAL HIGH (ref 24–36)

## 2015-05-27 LAB — CBC
HCT: 26.9 % — ABNORMAL LOW (ref 35.0–47.0)
Hemoglobin: 8.6 g/dL — ABNORMAL LOW (ref 12.0–16.0)
MCH: 30.2 pg (ref 26.0–34.0)
MCHC: 32 g/dL (ref 32.0–36.0)
MCV: 94.3 fL (ref 80.0–100.0)
Platelets: 102 10*3/uL — ABNORMAL LOW (ref 150–440)
RBC: 2.85 MIL/uL — ABNORMAL LOW (ref 3.80–5.20)
RDW: 20.1 % — ABNORMAL HIGH (ref 11.5–14.5)
WBC: 4.7 10*3/uL (ref 3.6–11.0)

## 2015-05-27 LAB — BODY FLUID CELL COUNT WITH DIFFERENTIAL
Eos, Fluid: 0 %
Lymphs, Fluid: 56 %
Monocyte-Macrophage-Serous Fluid: 11 %
Neutrophil Count, Fluid: 33 %
Other Cells, Fluid: 0 %
Total Nucleated Cell Count, Fluid: 742 cu mm

## 2015-05-27 LAB — BASIC METABOLIC PANEL
Anion gap: 4 — ABNORMAL LOW (ref 5–15)
BUN: 5 mg/dL — ABNORMAL LOW (ref 6–20)
CO2: 23 mmol/L (ref 22–32)
Calcium: 7.8 mg/dL — ABNORMAL LOW (ref 8.9–10.3)
Chloride: 110 mmol/L (ref 101–111)
Creatinine, Ser: 0.56 mg/dL (ref 0.44–1.00)
GFR calc Af Amer: >60 mL/min (ref >60)
GFR calc non Af Amer: >60 mL/min (ref >60)
Glucose, Bld: 126 mg/dL — ABNORMAL HIGH (ref 65–99)
Potassium: 3.6 mmol/L (ref 3.5–5.1)
Sodium: 137 mmol/L (ref 135–145)

## 2015-05-27 LAB — PROTIME-INR
INR: 1.6
Prothrombin Time: 19.1 seconds — ABNORMAL HIGH (ref 11.4–15.0)

## 2015-05-27 LAB — GLUCOSE, SEROUS FLUID: Glucose, Fluid: 154 mg/dL

## 2015-05-27 LAB — ALBUMIN, FLUID (OTHER): Albumin, Fluid: 1 g/dL

## 2015-05-27 LAB — PROTEIN, BODY FLUID: Total protein, fluid: <3 g/dL

## 2015-05-27 MED ORDER — ENOXAPARIN SODIUM 40 MG/0.4ML ~~LOC~~ SOLN
40.0000 mg | SUBCUTANEOUS | Status: DC
  Administered 2015-05-27: 40 mg via SUBCUTANEOUS
  Filled 2015-05-27: qty 0.4

## 2015-05-27 MED ORDER — MIRTAZAPINE 15 MG PO TABS
15.0000 mg | ORAL_TABLET | Freq: Every day | ORAL | Status: DC
  Administered 2015-05-27: 15 mg via ORAL
  Filled 2015-05-27: qty 1

## 2015-05-27 MED ORDER — MORPHINE SULFATE (PF) 2 MG/ML IV SOLN
2.0000 mg | INTRAVENOUS | Status: DC | PRN
  Administered 2015-05-27 – 2015-05-28 (×6): 2 mg via INTRAVENOUS
  Filled 2015-05-27 (×6): qty 1

## 2015-05-27 MED ORDER — OXYCODONE HCL 5 MG PO TABS
20.0000 mg | ORAL_TABLET | Freq: Four times a day (QID) | ORAL | Status: DC | PRN
  Administered 2015-05-27 – 2015-05-28 (×3): 20 mg via ORAL
  Filled 2015-05-27 (×3): qty 4

## 2015-05-27 NOTE — Clinical Social Work Note (Signed)
Clinical Social Work Assessment  Patient Details  Name: Audrey Garrett MRN: [id number] Date of Birth: [birth year]  Date of referral:  05/27/15               Reason for consult:  Facility Placement                Permission sought to share information with:  Family Supports Permission granted to share information::  Yes, Verbal Permission Granted  Name::        Agency::     Relationship::     Contact Information:     Housing/Transportation Living arrangements for the past 2 months:  Single Family Home Source of Information:  Patient Patient Interpreter Needed:  None Criminal Activity/Legal Involvement Pertinent to Current Situation/Hospitalization:  No - Comment as needed Significant Relationships:  Spouse Lives with:  Spouse Do you feel safe going back to the place where you live?  Yes Need for family participation in patient care:  Yes (Comment)  Care giving concerns:  Patient resides at home with her husband.   Social Worker assessment / plan:  CSW met with patient this morning regarding how she is coping with her illness and her abstinence from ETOH. Patient is very tearful with CSW and states that she hopes it is not too late and that she hopes her quitting drinking 2 weeks will help her health. Patient states that her husband also quit drinking with her to support her and that they have done well. Patient states her fear of her illness right now overrides her wish to drink. Patient states she has been drinking since she was 55 years old. Patient also stated that she found out a few years ago that she had Hep C. Both have taken their toll on her liver and she states she has been told that she needs a new liver but that she cannot be on the transplant list until she has abstained from drinking for at least a year. Patient states that she has taken medication for depression for a while and that her primary care MD recently changed her medication from lexapro to buspar and that for some  reason her pharmacy is unable to fill it until March 15. CSW has informed RN CM and she is going to call patient's [facility] [address]) to try to determine why this is. CSW also recommended a psychiatry consult to review her medications she is on to ensure they are the most appropriate. Patient is agreeable to this and patient's nurse will contact MD to request psych consult.   Employment status:  Disabled (Comment on whether or not currently receiving Disability) Insurance information:  Medicare PT Recommendations:  Not assessed at this time Information / Referral to community resources:     Patient/Family's Response to care:  Patient very appreciative of CSW assistance and medical staff assistance.   Patient/Family's Understanding of and Emotional Response to Diagnosis, Current Treatment, and Prognosis:  Patient very tearful and expresses being tired of being in pain. CSW offered support and encouragement.  Emotional Assessment Appearance:  Appears older than stated age Attitude/Demeanor/Rapport:   (regretful) Affect (typically observed):  Tearful/Crying, Depressed, Sad Orientation:  Oriented to Self, Oriented to Place, Oriented to  Time, Oriented to Situation Alcohol / Substance use:  Not Applicable Psych involvement (Current and /or in the community):  No (Comment)  Discharge Needs  Concerns to be addressed:  Care Coordination Readmission within the last 30 days:  No Current discharge risk:  Substance Abuse  Barriers to Discharge:  No Barriers Identified   Shela Leff, LCSW 05/27/2015, 11:22 AM

## 2015-05-27 NOTE — Care Management (Signed)
Notified by CSW that patient expressed concerns that she was not able to [facility] as was being told she was unable to pick it up until March 15th.  [facility] and [facility].  They were showing that insurance was denying due to it being to early to pick up.  I called her  Medication insurance coverage [place].  They stated an initial prescription was called into [facility] on [address] on 05/19/15, this is why the prescription was being denied at [place] has called [facility] and cancelled, and [facility] so patient will be able to pick it up at time of discharge.  Patient notified.

## 2015-05-27 NOTE — Progress Notes (Signed)
[place] [facility] [place] NAME: Audrey Garrett    MR#:  [id number]  DATE OF BIRTH:  [birth year]  SUBJECTIVE:   Patient here due to abdominal pain, distention noted to have ascites. Status post ultrasound-guided paracentesis with 4 L of fluid removed. Patient is quite tearful and depressed.  REVIEW OF SYSTEMS:    Review of Systems  Constitutional: Negative for fever and chills.  HENT: Negative for congestion and tinnitus.   Eyes: Negative for blurred vision and double vision.  Respiratory: Negative for cough, shortness of breath and wheezing.   Cardiovascular: Negative for chest pain, orthopnea and PND.  Gastrointestinal: Positive for abdominal pain. Negative for nausea, vomiting and diarrhea.  Genitourinary: Negative for dysuria and hematuria.  Neurological: Negative for dizziness, sensory change and focal weakness.  All other systems reviewed and are negative.   Nutrition: 2 g sodium Tolerating Diet: Yes Tolerating PT: Await evaluation    DRUG ALLERGIES:  No Known Allergies  VITALS:  Blood pressure 108/66, pulse 72, temperature 98.9 F (37.2 C), temperature source Oral, resp. rate 18, height 6' (1.829 m), weight 82.283 kg (181 lb 6.4 oz), SpO2 91 %.  PHYSICAL EXAMINATION:   Physical Exam  GENERAL:  55 y.o.-year-old patient lying in the bed tearful, depressed.   EYES: Pupils equal, round, reactive to light and accommodation. No scleral icterus. Extraocular muscles intact.  HEENT: Head atraumatic, normocephalic. Oropharynx and nasopharynx clear.  NECK:  Supple, no jugular venous distention. No thyroid enlargement, no tenderness.  LUNGS: Normal breath sounds bilaterally, no wheezing, rales, rhonchi. No use of accessory muscles of respiration.  CARDIOVASCULAR: S1, S2 normal. No murmurs, rubs, or gallops.  ABDOMEN: Soft, nontender, distended/  Bowel sounds present. No organomegaly or mass.  EXTREMITIES: No cyanosis, clubbing, +1-2  edema b/l.  NEUROLOGIC: Cranial nerves II through XII are intact. No focal Motor or sensory deficits b/l.  Globally weak.  PSYCHIATRIC: The patient is alert and oriented x 3. Tearful & depressed.  SKIN: No obvious rash, lesion, or ulcer.    LABORATORY PANEL:   CBC  Recent Labs Lab 05/27/15 0510  WBC 4.7  HGB 8.6*  HCT 26.9*  PLT 102*   ------------------------------------------------------------------------------------------------------------------  Chemistries   Recent Labs Lab 05/26/15 1526 05/26/15 2100 05/27/15 0510  NA 140  --  137  K 3.6  --  3.6  CL 114*  --  110  CO2 23  --  23  GLUCOSE 176*  --  126*  BUN 5*  --  5*  CREATININE 0.59  --  0.56  CALCIUM 7.7*  --  7.8*  MG  --  1.5*  --   AST 64*  --   --   ALT 38  --   --   ALKPHOS 91  --   --   BILITOT 0.7  --   --    ------------------------------------------------------------------------------------------------------------------  Cardiac Enzymes No results for input(s): TROPONINI in the last 168 hours. ------------------------------------------------------------------------------------------------------------------  RADIOLOGY:  US Paracentesis  05/27/2015  INDICATION: Abdominal distension. EXAM: ULTRASOUND GUIDED diagnostic and therapeutic PARACENTESIS MEDICATIONS: None. COMPLICATIONS: None immediate. PROCEDURE: Informed written consent was obtained from the patient after a discussion of the risks, benefits and alternatives to treatment. A timeout was performed prior to the initiation of the procedure. Initial ultrasound scanning demonstrates a large amount of ascites within the right lower abdominal quadrant. The right lower abdomen was prepped and draped in the usual sterile fashion. 1% lidocaine with epinephrine was used for  local anesthesia. Following this, a Safe-T-Centesis catheter was introduced. An ultrasound image was saved for documentation purposes. The paracentesis was performed. The catheter was  removed and a dressing was applied. The patient tolerated the procedure well without immediate post procedural complication. FINDINGS: A total of approximately 4 L of serous fluid was removed. Samples were sent to the laboratory as requested by the clinical team. IMPRESSION: Successful ultrasound-guided paracentesis yielding 4 liters of peritoneal fluid. Electronically Signed   By: Marijo Conception, M.D.   On: 05/27/2015 13:20     ASSESSMENT AND PLAN:   55 year old female with past medical history of hepatitis C, alcohol abuse, liver cirrhosis, history of restless leg syndrome, fibromyalgia, degenerative disc disease, urinary frequency, who presents to the hospital due to abdominal pain and distention and noted to have ascites.  #1 abdominal pain/distention-this is secondary to tense ascites. Patient does have a history of alcohol abuse, hep C and liver cirrhosis. -Patient is status post ultrasound-guided paracentesis with 4 L of fluid removed. We'll await fluid analysis to rule out SBP. -Continue supportive care with diuretics for now.  #2 showed hep C, alcohol abuse and liver cirrhosis-no evidence of hepatic encephalopathy presently. -Continue Lasix, Aldactone for underlying ascites.  #3 chronic pain/myalgia-continue oxycodone.  #4 anemia/thrombocytopenia-this is chronic for the patient secondary to her chronic liver disease. No evidence of acute bleeding will follow counts.   All the records are reviewed and case discussed with Care Management/Social Workerr. Management plans discussed with the patient, family and they are in agreement.  CODE STATUS: Full  DVT Prophylaxis: Lovenox  TOTAL TIME TAKING CARE OF THIS PATIENT: 30 minutes.   POSSIBLE D/C IN 1-2 DAYS, DEPENDING ON CLINICAL CONDITION.   Henreitta Leber M.D on 05/27/2015 at 3:06 PM  Between 7am to 6pm - Pager - [phone]  After 6pm go to [email or web address] - password [email or web address] [facility]  Office   [phone]  CC: Primary care physician; Kirk Ruths., MD

## 2015-05-27 NOTE — Consult Note (Signed)
[facility] Face-to-Face Psychiatry Consult   Reason for Consult:  Consult for this 55 year old woman with a history of cirrhosis. Concern about her depression. Referring Physician:  Verdell Carmine Patient Identification: Audrey Garrett MRN:  [id number] Principal Diagnosis: Depression, major, recurrent, moderate ([place]) Diagnosis:   Patient Active Problem List   Diagnosis Date Noted  . Depression, major, recurrent, moderate ([place]) [F33.1] 05/27/2015  . Alcohol abuse [F10.10] 05/27/2015  . Abdominal pain, chronic, epigastric [R10.13, G89.29] 05/27/2015  . Difficulty urinating [R39.198] 05/26/2015  . Nephrolithiasis [N20.0] 05/26/2015  . Alcoholic cirrhosis of liver with ascites ([place]) [K70.31] 05/26/2015  . Liver cirrhosis, alcoholic ([place]) [N56.21] 30/86/5784  . Hepatitis C [B19.20] 05/22/2015  . Hypotension [I95.9] 05/22/2015  . Anemia [D64.9] 05/22/2015  . Thrombocytopenia ([place]) [D69.6] 05/22/2015  . Coagulopathy ([place]) [D68.9] 05/22/2015  . Hyperglycemia [R73.9] 05/22/2015  . Polyneuropathy ([place]) [G62.9] 05/22/2015  . Urinary retention [R33.9] 05/22/2015  . Ascites [R18.8] 05/21/2015  . Hypomagnesemia (low magnesium levels) [E83.42] 04/07/2015  . Chronic pain [G89.29] 04/03/2015  . At risk for osteopenia [Z87.898] 04/03/2015  . Lumbar spondylosis [M47.816] 04/03/2015  . Chronic lumbar radicular pain (Left) [M54.16, G89.29] 04/03/2015  . Chronic neck pain [M54.2, G89.29] 04/03/2015  . Chronic lower extremity pain (Left) [O96.295, G89.29] 04/03/2015  . Chronic sacroiliac joint pain [M53.3, G89.29] 04/03/2015  . Chronic upper back pain [M54.6, G89.29] 04/03/2015  . Long term current use of opiate analgesic [Z79.891] 04/03/2015  . Encounter for chronic pain management [Z71.89] 04/03/2015  . Bilateral low back pain with left-sided sciatica [M54.42] 01/01/2015  . Chronic pain syndrome [G89.4] 01/01/2015  . Cervical radicular pain [M54.12] 01/01/2015  . Other long term (current) drug therapy [Z79.899]  01/01/2015  . Uncomplicated opioid dependence ([place]) [F11.20] 01/01/2015  . Long term prescription opiate use [Z79.899] 01/01/2015  . Lumbar facet arthropathy [M47.816] 01/01/2015  . 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 Lumbar facet syndrome (Bilateral) [M54.5] 01/01/2015  . Osteoarthritis of spine with radiculopathy, lumbar region [facility] 01/01/2015  . Pain of paraspinal muscle [M79.1] 01/01/2015  . Low back pain with radiation [M54.40] 01/01/2015  . Opiate use [F11.90] 01/01/2015  . Encounter for therapeutic drug level monitoring [Z51.81] 01/01/2015  . Insomnia, persistent [G47.00] 12/25/2014  . Major depression in remission ([place]) [F32.5] 12/25/2014  . BP (high blood pressure) [I10] 12/25/2014  . Encounter for general adult medical examination without abnormal findings [Z00.00] 12/25/2014  . HCV (hepatitis C virus) [B19.20] 12/25/2014  . ACUTE SINUSITIS, UNSPECIFIED [J01.90] 11/01/2009  . BRONCHITIS, ACUTE [J20.9] 11/01/2009    Total Time spent with patient: 1 hour  Subjective:   Audrey Garrett is a 55 y.o. female patient admitted with "I've had so much trouble with my health".  HPI:  Patient interviewed. Chart reviewed. Medications reviewed labs reviewed. 55 year old woman currently in the hospital because of complications of her cirrhosis. Noted to still be very sad and down. Patient says that for the last couple months she has been feeling just overwhelmed. She has no energy and never feels like doing anything at all. She feels angry at herself and that her health problems a lot. Feels like she and her family are being cheated by her medical problems. She is also upset because she feels like she is not getting a clear picture of what her prognosis is. She sleeps poorly at night. Mood is down during the day. Cries frequently. Denies that she's having any suicidal thoughts or wish to die. Denies any homicidal ideation. Denies hallucinations. Patient is on Lexapro 20 mg a  day  which is been a chronic medicine from her primary care doctor. She had been taking clonazepam a milligram at night as well and is frustrated that she had not gotten that more recently. Her primary care doctor and wanted to start her on BuSpar 15 mg twice a day but she says she had not started the medicine yet.  Social history: Patient lives at home with her husband. Has an adult daughter in an adult son and several grandchildren. Worries about all of them a great deal.  Medical history: Patient has cirrhosis related to chronic alcohol abuse. Multiple medical complications as a result. Chronic pain problems. Chronic back pain as well.  Substance abuse history: Patient has a long history of alcohol abuse says that she stopped drinking completely 2 weeks ago and feels now that she is really going to stop. She denies that she abuses any other medications narcotics she says of been of only partial help with her chronic pain problems  Past Psychiatric History: Patient denies any history of suicide attempts. Never been in a psychiatric hospital. No history of mania and no history of psychosis. She thinks she is been on other antidepressants in the past but she is unclear about at the Lexapro seems to of been a chronic thing.  Risk to Self: Is patient at risk for suicide?: No Risk to Others:   Prior Inpatient Therapy:   Prior Outpatient Therapy:    Past Medical History:  Past Medical History  Diagnosis Date  . Depression   . Anxiety   . High risk medications (not anticoagulants) long-term use   . Vitamin D deficiency disease   . Lumbago   . Hypertension   . Chronic hepatitis C ([place])   . Alcohol abuse   . Bronchitis   . Arthropathy   . Back pain   . Shoulder pain   . Stress headaches   . Hyperglycemia   . SOB (shortness of breath)   . Muscle spasm   . Edema   . Cirrhosis ([place])   . Polyarthritis   . Fatigue   . Sinusitis   . RLS (restless legs syndrome)   . Alcoholic cirrhosis of liver  without ascites ([place])   . Reflux   . Fibromyalgia   . Spine disorder   . Chronic LBP 12/25/2014    Overview:  Post extensive surgery   . DDD (degenerative disc disease), lumbar 01/01/2015  . Lumbar radicular pain 01/01/2015  . Neck pain 01/01/2015  . Left leg pain 01/01/2015  . Sacroiliac pain 01/01/2015  . Upper back pain 01/01/2015  . Urinary frequency     Past Surgical History  Procedure Laterality Date  . Tubal ligation    . Tonsillectomy    . Back surgery  12/19/2011  . Esophagogastroduodenoscopy N/A 04/14/2015    Procedure: ESOPHAGOGASTRODUODENOSCOPY (EGD);  Surgeon: Lollie Sails, MD;  Location[facility] ENDOSCOPY;  Service: Endoscopy;  Laterality: N/A;   Family History:  Family History  Problem Relation Age of Onset  . Stroke Mother   . Heart disease Mother   . [name] Father   . Kidney disease Neg Hx   . Bladder Cancer Neg Hx    Family Psychiatric  History: Patient says that her father's side of the family there were multiple people with alcohol and drug abuse problems. Social History:  History  Alcohol Use  . 0.0 oz/week  . 0 Standard drinks or equivalent per week    Comment: but 10 days post stopping  History  Drug Use No    Social History   Social History  . Marital Status: Married    Spouse Name: N/A  . Number of Children: N/A  . Years of Education: N/A   Social History Main Topics  . Smoking status: Current Every Day Smoker -- 1.00 packs/day for 30 years    Types: Cigarettes  . Smokeless tobacco: None  . Alcohol Use: 0.0 oz/week    0 Standard drinks or equivalent per week     Comment: but 10 days post stopping  . Drug Use: No  . Sexual Activity: Not Asked   Other Topics Concern  . None   Social History Narrative   Lives at home with husband   Additional Social History:    Allergies:  No Known Allergies  Labs:  Results for orders placed or performed during the hospital encounter of 05/26/15 (from the past 48 hour(s))  Lipase, blood      Status: None   Collection Time: 05/26/15  3:26 PM  Result Value Ref Range   Lipase 40 11 - 51 U/L  Comprehensive metabolic panel     Status: Abnormal   Collection Time: 05/26/15  3:26 PM  Result Value Ref Range   Sodium 140 135 - 145 mmol/L   Potassium 3.6 3.5 - 5.1 mmol/L   Chloride 114 (H) 101 - 111 mmol/L   CO2 23 22 - 32 mmol/L   Glucose, Bld 176 (H) 65 - 99 mg/dL   BUN 5 (L) 6 - 20 mg/dL   Creatinine, Ser 0.59 0.44 - 1.00 mg/dL   Calcium 7.7 (L) 8.9 - 10.3 mg/dL   Total Protein 6.0 (L) 6.5 - 8.1 g/dL   Albumin 2.5 (L) 3.5 - 5.0 g/dL   AST 64 (H) 15 - 41 U/L   ALT 38 14 - 54 U/L   Alkaline Phosphatase 91 38 - 126 U/L   Total Bilirubin 0.7 0.3 - 1.2 mg/dL   GFR calc non Af Amer >60 >60 mL/min   GFR calc Af Amer >60 >60 mL/min    Comment: (NOTE) The eGFR has been calculated using the CKD EPI equation. This calculation has not been validated in all clinical situations. eGFR's persistently <60 mL/min signify possible Chronic Kidney Disease.    Anion gap 3 (L) 5 - 15  CBC     Status: Abnormal   Collection Time: 05/26/15  3:26 PM  Result Value Ref Range   WBC 5.3 3.6 - 11.0 K/uL   RBC 3.29 (L) 3.80 - 5.20 MIL/uL   Hemoglobin 9.8 (L) 12.0 - 16.0 g/dL   HCT 31.2 (L) 35.0 - 47.0 %   MCV 94.9 80.0 - 100.0 fL   MCH 29.7 26.0 - 34.0 pg   MCHC 31.3 (L) 32.0 - 36.0 g/dL   RDW 20.0 (H) 11.5 - 14.5 %   Platelets 116 (L) 150 - 440 K/uL  Magnesium     Status: Abnormal   Collection Time: 05/26/15  9:00 PM  Result Value Ref Range   Magnesium 1.5 (L) 1.7 - 2.4 mg/dL  Urinalysis complete, with microscopic ([facility] only)     Status: Abnormal   Collection Time: 05/26/15 10:40 PM  Result Value Ref Range   Color, Urine YELLOW (A) YELLOW   APPearance CLEAR (A) CLEAR   Glucose, UA NEGATIVE NEGATIVE mg/dL   Bilirubin Urine NEGATIVE NEGATIVE   Ketones, ur NEGATIVE NEGATIVE mg/dL   Specific Gravity, Urine 1.017 1.005 - 1.030   Hgb urine dipstick NEGATIVE  NEGATIVE   pH 5.0 5.0 - 8.0    Protein, ur NEGATIVE NEGATIVE mg/dL   Nitrite NEGATIVE NEGATIVE   Leukocytes, UA NEGATIVE NEGATIVE   RBC / HPF 0-5 0 - 5 RBC/hpf   WBC, UA 0-5 0 - 5 WBC/hpf   Bacteria, UA NONE SEEN NONE SEEN   Squamous Epithelial / LPF 0-5 (A) NONE SEEN   Mucous PRESENT    Hyaline Casts, UA PRESENT   Basic metabolic panel     Status: Abnormal   Collection Time: 05/27/15  5:10 AM  Result Value Ref Range   Sodium 137 135 - 145 mmol/L   Potassium 3.6 3.5 - 5.1 mmol/L   Chloride 110 101 - 111 mmol/L   CO2 23 22 - 32 mmol/L   Glucose, Bld 126 (H) 65 - 99 mg/dL   BUN 5 (L) 6 - 20 mg/dL   Creatinine, Ser 8.24 0.44 - 1.00 mg/dL   Calcium 7.8 (L) 8.9 - 10.3 mg/dL   GFR calc non Af Amer >60 >60 mL/min   GFR calc Af Amer >60 >60 mL/min    Comment: (NOTE) The eGFR has been calculated using the CKD EPI equation. This calculation has not been validated in all clinical situations. eGFR's persistently <60 mL/min signify possible Chronic Kidney Disease.    Anion gap 4 (L) 5 - 15  CBC     Status: Abnormal   Collection Time: 05/27/15  5:10 AM  Result Value Ref Range   WBC 4.7 3.6 - 11.0 K/uL   RBC 2.85 (L) 3.80 - 5.20 MIL/uL   Hemoglobin 8.6 (L) 12.0 - 16.0 g/dL   HCT 29.9 (L) 80.6 - 99.9 %   MCV 94.3 80.0 - 100.0 fL   MCH 30.2 26.0 - 34.0 pg   MCHC 32.0 32.0 - 36.0 g/dL   RDW 67.2 (H) 27.7 - 37.5 %   Platelets 102 (L) 150 - 440 K/uL  APTT     Status: Abnormal   Collection Time: 05/27/15  5:10 AM  Result Value Ref Range   aPTT 43 (H) 24 - 36 seconds    Comment:        IF BASELINE aPTT IS ELEVATED, SUGGEST PATIENT RISK ASSESSMENT BE USED TO DETERMINE APPROPRIATE ANTICOAGULANT THERAPY.   Protime-INR     Status: Abnormal   Collection Time: 05/27/15  5:10 AM  Result Value Ref Range   Prothrombin Time 19.1 (H) 11.4 - 15.0 seconds   INR 1.60   Albumin, pleural or peritoneal fluid     Status: None   Collection Time: 05/27/15 12:15 PM  Result Value Ref Range   Albumin, Fluid 1.0 g/dL   Fluid  Type-FALB PERITONEAL     Comment: CORRECTED ON 02/28 AT 1321: PREVIOUSLY REPORTED AS CYTO PERI  Protein, pleural or peritoneal fluid     Status: None   Collection Time: 05/27/15 12:15 PM  Result Value Ref Range   Total protein, fluid <3.0 g/dL    Comment: (NOTE) No normal range established for this test Results should be evaluated in conjunction with serum values    Fluid Type-FTP CYTO PERI   Body fluid cell count with differential     Status: Abnormal   Collection Time: 05/27/15 12:15 PM  Result Value Ref Range   Fluid Type-FCT CYTO PERI    Color, Fluid YELLOW (A) YELLOW   Appearance, Fluid HAZY (A) CLEAR   WBC, Fluid 742 cu mm   Neutrophil Count, Fluid 33 %   Lymphs, Fluid 56 %  Monocyte-Macrophage-Serous Fluid 11 %   Eos, Fluid 0 %   Other Cells, Fluid 0 %  Glucose, pleural or peritoneal fluid     Status: None   Collection Time: 05/27/15 12:15 PM  Result Value Ref Range   Glucose, Fluid 154 mg/dL    Comment: (NOTE) No normal range established for this test Results should be evaluated in conjunction with serum values    Fluid Type-FGLU PERITONEAL     Current Facility-Administered Medications  Medication Dose Route Frequency Provider Last Rate Last Dose  . 0.9 %  sodium chloride infusion  250 mL Intravenous PRN Shaune Pollack, MD      . acetaminophen (TYLENOL) tablet 650 mg  650 mg Oral Q6H PRN Shaune Pollack, MD       Or  . acetaminophen (TYLENOL) suppository 650 mg  650 mg Rectal Q6H PRN Shaune Pollack, MD      . albuterol (PROVENTIL) (2.5 MG/3ML) 0.083% nebulizer solution 2.5 mg  2.5 mg Nebulization Q2H PRN Shaune Pollack, MD      . enoxaparin (LOVENOX) injection 40 mg  40 mg Subcutaneous Q24H Houston Siren, MD      . furosemide (LASIX) tablet 40 mg  40 mg Oral Daily Shaune Pollack, MD   40 mg at 05/27/15 1114  . mirtazapine (REMERON) tablet 15 mg  15 mg Oral QHS Audery Amel, MD      . morphine 2 MG/ML injection 2 mg  2 mg Intravenous Q4H PRN Houston Siren, MD   2 mg at 05/27/15 1526   . multivitamin with minerals tablet 1 tablet  1 tablet Oral Daily Shaune Pollack, MD   1 tablet at 05/27/15 1113  . ondansetron (ZOFRAN) tablet 4 mg  4 mg Oral Q6H PRN Shaune Pollack, MD       Or  . ondansetron [facility]) injection 4 mg  4 mg Intravenous Q6H PRN Shaune Pollack, MD      . oxyCODONE (Oxy IR/ROXICODONE) immediate release tablet 20 mg  20 mg Oral Q6H PRN Shaune Pollack, MD   20 mg at 05/27/15 1114  . sodium chloride flush (NS) 0.9 % injection 3 mL  3 mL Intravenous Q12H Shaune Pollack, MD   3 mL at 05/27/15 1000  . sodium chloride flush (NS) 0.9 % injection 3 mL  3 mL Intravenous PRN Shaune Pollack, MD      . spironolactone (ALDACTONE) tablet 25 mg  25 mg Oral Daily Shaune Pollack, MD   25 mg at 05/27/15 1114    Musculoskeletal: Strength & Muscle Tone: decreased Gait & Station: unable to stand Patient leans: N/A  Psychiatric Specialty Exam: Review of Systems  Constitutional: Positive for weight loss and malaise/fatigue.  HENT: Negative.   Eyes: Negative.   Respiratory: Negative.   Cardiovascular: Negative.   Gastrointestinal: Positive for nausea and abdominal pain.  Genitourinary: Positive for dysuria.  Musculoskeletal: Negative.   Skin: Negative.   Neurological: Negative.   Psychiatric/Behavioral: Positive for depression. Negative for suicidal ideas, hallucinations, memory loss and substance abuse. The patient is nervous/anxious and has insomnia.     Blood pressure 108/66, pulse 72, temperature 98.9 F (37.2 C), temperature source Oral, resp. rate 18, height 6' (1.829 m), weight 82.283 kg (181 lb 6.4 oz), SpO2 91 %.Body mass index is 24.6 kg/(m^2).  General Appearance: Disheveled  Eye Solicitor::  Fair  Speech:  Clear and Coherent  Volume:  Normal  Mood:  Anxious and Depressed  Affect:  Tearful  Thought Process:  Tangential  Orientation:  Full (Time, Place, and Person)  Thought Content:  Negative  Suicidal Thoughts:  No  Homicidal Thoughts:  No  Memory:  Immediate;   Good Recent;   Fair Remote;    Fair  Judgement:  Fair  Insight:  Fair  Psychomotor Activity:  Decreased  Concentration:  Fair  Recall:  [employer] of Knowledge:Fair  Language: Fair  Akathisia:  No  Handed:  Right  AIMS (if indicated):     Assets:  Communication Skills Desire for Improvement Housing Resilience Social Support  ADL's:  Intact  Cognition: WNL  Sleep:      Treatment Plan Summary: Medication management and Plan 55 year old woman with major depression moderate recurrent. Also related to her multiple medical problems. I propose that since the Lexapro has not been working and while we try switching her to a different antidepressant. I will continue the Lexapro but I'm starting her on mirtazapine 15 mg at night. I would probably not start the BuSpar at this point. Psychoeducation and supportive counseling done. Encouraged patient to look into following up with outpatient therapy after discharge. I will continue to follow up as needed. Does not appear to need inpatient hospital level treatment.  Disposition: Patient does not meet criteria for psychiatric inpatient admission. Supportive therapy provided about ongoing stressors.  Alethia Berthold, MD 05/27/2015 7:56 PM

## 2015-05-28 LAB — PH, BODY FLUID: pH, Body Fluid: 7.6

## 2015-05-28 LAB — BASIC METABOLIC PANEL
Anion gap: 3 — ABNORMAL LOW (ref 5–15)
BUN: 5 mg/dL — ABNORMAL LOW (ref 6–20)
CO2: 28 mmol/L (ref 22–32)
Calcium: 7.7 mg/dL — ABNORMAL LOW (ref 8.9–10.3)
Chloride: 106 mmol/L (ref 101–111)
Creatinine, Ser: 0.52 mg/dL (ref 0.44–1.00)
GFR calc Af Amer: >60 mL/min (ref >60)
GFR calc non Af Amer: >60 mL/min (ref >60)
Glucose, Bld: 167 mg/dL — ABNORMAL HIGH (ref 65–99)
Potassium: 3.6 mmol/L (ref 3.5–5.1)
Sodium: 137 mmol/L (ref 135–145)

## 2015-05-28 LAB — LIPASE, FLUID: Lipase-Fluid: 36 U/L

## 2015-05-28 MED ORDER — LACTULOSE 10 GM/15ML PO SOLN: 30.0000 g | Freq: Every day | ORAL | Status: DC

## 2015-05-28 MED ORDER — PREGABALIN 25 MG PO CAPS: 25.0000 mg | ORAL_CAPSULE | Freq: Two times a day (BID) | ORAL | Status: DC

## 2015-05-28 MED ORDER — MIRTAZAPINE 15 MG PO TABS: 15.0000 mg | ORAL_TABLET | Freq: Every day | ORAL | Status: DC

## 2015-05-28 MED ORDER — LACTULOSE 10 GM/15ML PO SOLN
30.0000 g | Freq: Two times a day (BID) | ORAL | Status: DC
  Administered 2015-05-28: 30 g via ORAL
  Filled 2015-05-28: qty 60

## 2015-05-28 NOTE — Care Management Important Message (Signed)
Important Message  Patient Details  Name: Audrey Garrett MRN: [id number] Date of Birth: 05-20-1960   Medicare Important Message Given:  Yes    Juliann Pulse A Lazariah Savard 05/28/2015, 10:10 AM

## 2015-05-28 NOTE — Discharge Summary (Addendum)
[facility] NAME: Audrey Garrett    MR#:  [id number]  DATE OF BIRTH:  [birth year]  DATE OF ADMISSION:  05/26/2015 ADMITTING PHYSICIAN: Demetrios Loll, MD  DATE OF DISCHARGE: 05/28/2015  1:30 PM  PRIMARY CARE PHYSICIAN: Kirk Ruths., MD    ADMISSION DIAGNOSIS:  Generalized abdominal pain [R10.84] Ascites [R18.8]  DISCHARGE DIAGNOSIS:  Principal Problem:   Depression, major, recurrent, moderate (HCC) Active Problems:   Ascites   Alcohol abuse   Abdominal pain, chronic, epigastric   SECONDARY DIAGNOSIS:   Past Medical History  Diagnosis Date  . Depression   . Anxiety   . High risk medications (not anticoagulants) long-term use   . Vitamin D deficiency disease   . Lumbago   . Hypertension   . Chronic hepatitis C ([place])   . Alcohol abuse   . Bronchitis   . Arthropathy   . Back pain   . Shoulder pain   . Stress headaches   . Hyperglycemia   . SOB (shortness of breath)   . Muscle spasm   . Edema   . Cirrhosis ([place])   . Polyarthritis   . Fatigue   . Sinusitis   . RLS (restless legs syndrome)   . Alcoholic cirrhosis of liver without ascites ([place])   . Reflux   . Fibromyalgia   . Spine disorder   . Chronic LBP 12/25/2014    Overview:  Post extensive surgery   . DDD (degenerative disc disease), lumbar 01/01/2015  . Lumbar radicular pain 01/01/2015  . Neck pain 01/01/2015  . Left leg pain 01/01/2015  . Sacroiliac pain 01/01/2015  . Upper back pain 01/01/2015  . Urinary frequency     HOSPITAL COURSE:   55 year old female with past medical history of hepatitis C, alcohol abuse, liver cirrhosis, history of restless leg syndrome, fibromyalgia, degenerative disc disease, urinary frequency, who presents to the hospital due to abdominal pain and distention and noted to have ascites.  #1 abdominal pain/distention-this was secondary to tense ascites. Patient does have a history of alcohol abuse, hep C and liver  cirrhosis. -She underwent ultrasound-guided paracentesis and had 4 L of fluid removed. [name] fluid analysis is negative for any acute infection or SBP. She still has some abdominal pain but improved since yesterday. -She likely will need repeat paracentesis. Done as an outpatient. She will continue her diuretics Lasix and Aldactone for now.  #2 hx of hep C, alcohol abuse and liver cirrhosis- he had no evidence of hepatic encephalopathy while in the hospital. She will cont. Follow up with GI as outpatient.  - she will cont. Her Lasix, Aldactone. She was started on a small dose of lactulose.  #3 chronic pain/myalgia-she will continue oxycodone, Lyrica  #4 anemia/thrombocytopenia-this is chronic for the patient secondary to her chronic liver disease. No evidence of acute bleeding and her counts can be further followed as an outpatient.   DISCHARGE CONDITIONS:   Stable.   CONSULTS OBTAINED:  Treatment Team:  Gonzella Lex, MD  DRUG ALLERGIES:  No Known Allergies  DISCHARGE MEDICATIONS:   Discharge Medication List as of 05/28/2015 12:53 PM    START taking these medications   Details  lactulose (CHRONULAC) 10 GM/15ML solution Take 45 mLs (30 g total) by mouth daily., Starting 05/28/2015, Until Discontinued, Print    mirtazapine (REMERON) 15 MG tablet Take 1 tablet (15 mg total) by mouth at bedtime., Starting 05/28/2015, Until Discontinued, Print      CONTINUE  these medications which have CHANGED   Details  pregabalin (LYRICA) 25 MG capsule Take 1 capsule (25 mg total) by mouth 2 (two) times daily., Starting 05/28/2015, Until Discontinued, Print      CONTINUE these medications which have NOT CHANGED   Details  furosemide (LASIX) 40 MG tablet Take 40 mg by mouth daily. , Until Discontinued, Historical Med    magnesium gluconate (MAGONATE) 500 MG tablet Take 500 mg by mouth daily., Until Discontinued, Historical Med    Multiple Vitamins-Minerals (MULTIVITAMIN WITH MINERALS) tablet  Take 1 tablet by mouth daily., Until Discontinued, Historical Med    Oxycodone HCl 20 MG TABS Take 20 mg by mouth every 6 (six) hours as needed (for pain)., Until Discontinued, Historical Med    spironolactone (ALDACTONE) 25 MG tablet Take 25 mg by mouth daily. , Until Discontinued, Historical Med         DISCHARGE INSTRUCTIONS:   DIET:  2 gram sodium.   DISCHARGE CONDITION:  Stable  ACTIVITY:  Activity as tolerated  OXYGEN:  Home Oxygen: No.   Oxygen Delivery: room air  DISCHARGE LOCATION:  home   If you experience worsening of your admission symptoms, develop shortness of breath, life threatening emergency, suicidal or homicidal thoughts you must seek medical attention immediately by calling 911 or calling your MD immediately  if symptoms less severe.  You Must read complete instructions/literature along with all the possible adverse reactions/side effects for all the Medicines you take and that have been prescribed to you. Take any new Medicines after you have completely understood and accpet all the possible adverse reactions/side effects.   Please note  You were cared for by a hospitalist during your hospital stay. If you have any questions about your discharge medications or the care you received while you were in the hospital after you are discharged, you can call the unit and asked to speak with the hospitalist on call if the hospitalist that took care of you is not available. Once you are discharged, your primary care physician will handle any further medical issues. Please note that NO REFILLS for any discharge medications will be authorized once you are discharged, as it is imperative that you return to your primary care physician (or establish a relationship with a primary care physician if you do not have one) for your aftercare needs so that they can reassess your need for medications and monitor your lab values.     Today   Still has some abdominal pain. No  fever, nausea, vomiting. Body fluid analysis was negative for SBP.  VITAL SIGNS:  Blood pressure 98/48, pulse 71, temperature 98.6 F (37 C), temperature source Oral, resp. rate 16, height 6' (1.829 m), weight 82.283 kg (181 lb 6.4 oz), SpO2 97 %.  I/O:   Intake/Output Summary (Last 24 hours) at 05/28/15 1715 Last data filed at 05/28/15 0900  Gross per 24 hour  Intake    720 ml  Output   1300 ml  Net   -580 ml    PHYSICAL EXAMINATION:   GENERAL: 55 y.o.-year-old patient lying in the bed in NAD.  EYES: Pupils equal, round, reactive to light and accommodation. No scleral icterus. Extraocular muscles intact.  HEENT: Head atraumatic, normocephalic. Oropharynx and nasopharynx clear.  NECK: Supple, no jugular venous distention. No thyroid enlargement, no tenderness.  LUNGS: Normal breath sounds bilaterally, no wheezing, rales, rhonchi. No use of accessory muscles of respiration.  CARDIOVASCULAR: S1, S2 normal. No murmurs, rubs, or gallops.  ABDOMEN: Soft,  nontender, distended Bowel sounds present. No organomegaly or mass.  EXTREMITIES: No cyanosis, clubbing, +1-2 edema b/l.  NEUROLOGIC: Cranial nerves II through XII are intact. No focal Motor or sensory deficits b/l. Globally weak.  PSYCHIATRIC: The patient is alert and oriented x 3.  SKIN: No obvious rash, lesion, or ulcer.   DATA REVIEW:   CBC  Recent Labs Lab 05/27/15 0510  WBC 4.7  HGB 8.6*  HCT 26.9*  PLT 102*    Chemistries   Recent Labs Lab 05/26/15 1526 05/26/15 2100  05/28/15 0551  NA 140  --   < > 137  K 3.6  --   < > 3.6  CL 114*  --   < > 106  CO2 23  --   < > 28  GLUCOSE 176*  --   < > 167*  BUN 5*  --   < > 5*  CREATININE 0.59  --   < > 0.52  CALCIUM 7.7*  --   < > 7.7*  MG  --  1.5*  --   --   AST 64*  --   --   --   ALT 38  --   --   --   ALKPHOS 91  --   --   --   BILITOT 0.7  --   --   --   < > = values in this interval not displayed.  Cardiac Enzymes No results for input(s):  TROPONINI in the last 168 hours.  Microbiology Results  Results for orders placed or performed during the hospital encounter of 05/26/15  Body fluid culture     Status: None (Preliminary result)   Collection Time: 05/27/15 12:15 PM  Result Value Ref Range Status   Specimen Description PERITONEAL  Final   Special Requests PERITONEAL  Final   Gram Stain PENDING  Incomplete   Culture NO GROWTH < 24 HOURS  Final   Report Status PENDING  Incomplete    RADIOLOGY:  US Paracentesis  05/27/2015  INDICATION: Abdominal distension. EXAM: ULTRASOUND GUIDED diagnostic and therapeutic PARACENTESIS MEDICATIONS: None. COMPLICATIONS: None immediate. PROCEDURE: Informed written consent was obtained from the patient after a discussion of the risks, benefits and alternatives to treatment. A timeout was performed prior to the initiation of the procedure. Initial ultrasound scanning demonstrates a large amount of ascites within the right lower abdominal quadrant. The right lower abdomen was prepped and draped in the usual sterile fashion. 1% lidocaine with epinephrine was used for local anesthesia. Following this, a Safe-T-Centesis catheter was introduced. An ultrasound image was saved for documentation purposes. The paracentesis was performed. The catheter was removed and a dressing was applied. The patient tolerated the procedure well without immediate post procedural complication. FINDINGS: A total of approximately 4 L of serous fluid was removed. Samples were sent to the laboratory as requested by the clinical team. IMPRESSION: Successful ultrasound-guided paracentesis yielding 4 liters of peritoneal fluid. Electronically Signed   By: Marijo Conception, M.D.   On: 05/27/2015 13:20      Management plans discussed with the patient, family and they are in agreement.  CODE STATUS:     Code Status Orders        Start     Ordered   05/26/15 2019  Full code   Continuous     05/26/15 2018    Code Status History     Date Active Date Inactive Code Status Order ID Comments User Context   05/21/2015  2:59  PM 05/22/2015  7:30 PM Full Code [id number]  Gladstone Lighter, MD Inpatient   11/29/2013 12:02 PM 11/30/2013  3:31 AM Full Code [id number]  Logan Bores, MD HOV      TOTAL TIME TAKING CARE OF THIS PATIENT: 40 minutes.    Henreitta Leber M.D on 05/28/2015 at 5:15 PM  Between 7am to 6pm - Pager - [phone]  After 6pm go to [email or web address] - password [email or web address]  Office  [phone]  CC: Primary care physician; Kirk Ruths., MD

## 2015-05-28 NOTE — Progress Notes (Signed)
PT Cancellation Note  Patient Details Name: Meshelle Holness MRN: [id number] DOB: Jul 19, 1960   Cancelled Treatment:    Reason Eval/Treat Not Completed: Other (comment). Pt now has discharge orders. PT consulted with RN and states that there is not a need for skilled PT at this time as she is preparing to discharge home.   Neoma Laming, PT, DPT  05/28/2015, 9:40 AM [phone]

## 2015-05-29 LAB — CYTOLOGY - NON PAP

## 2015-06-01 ENCOUNTER — Encounter: Payer: Self-pay | Admitting: Emergency Medicine

## 2015-06-01 ENCOUNTER — Emergency Department
Admission: EM | Admit: 2015-06-01 | Discharge: 2015-06-01 | Disposition: A | Discharge status: Home / Self Care | Payer: Medicare Other | Attending: Emergency Medicine | Admitting: Emergency Medicine

## 2015-06-01 ENCOUNTER — Emergency Department: Payer: Medicare Other

## 2015-06-01 DIAGNOSIS — R1900 Intra-abdominal and pelvic swelling, mass and lump, unspecified site: Secondary | ICD-10-CM | POA: Diagnosis present

## 2015-06-01 DIAGNOSIS — Z79899 Other long term (current) drug therapy: Secondary | ICD-10-CM | POA: Diagnosis not present

## 2015-06-01 DIAGNOSIS — Y658 Other specified misadventures during surgical and medical care: Secondary | ICD-10-CM | POA: Insufficient documentation

## 2015-06-01 DIAGNOSIS — I10 Essential (primary) hypertension: Secondary | ICD-10-CM | POA: Diagnosis not present

## 2015-06-01 DIAGNOSIS — R188 Other ascites: Secondary | ICD-10-CM

## 2015-06-01 DIAGNOSIS — R600 Localized edema: Secondary | ICD-10-CM

## 2015-06-01 DIAGNOSIS — T8189XA Other complications of procedures, not elsewhere classified, initial encounter: Secondary | ICD-10-CM | POA: Diagnosis not present

## 2015-06-01 DIAGNOSIS — F17210 Nicotine dependence, cigarettes, uncomplicated: Secondary | ICD-10-CM | POA: Insufficient documentation

## 2015-06-01 LAB — PROTIME-INR
INR: 1.45
Prothrombin Time: 17.7 seconds — ABNORMAL HIGH (ref 11.4–15.0)

## 2015-06-01 LAB — COMPREHENSIVE METABOLIC PANEL
ALT: 36 U/L (ref 14–54)
AST: 67 U/L — ABNORMAL HIGH (ref 15–41)
Albumin: 2.6 g/dL — ABNORMAL LOW (ref 3.5–5.0)
Alkaline Phosphatase: 119 U/L (ref 38–126)
Anion gap: 5 (ref 5–15)
BUN: <5 mg/dL — ABNORMAL LOW (ref 6–20)
CO2: 25 mmol/L (ref 22–32)
Calcium: 8 mg/dL — ABNORMAL LOW (ref 8.9–10.3)
Chloride: 101 mmol/L (ref 101–111)
Creatinine, Ser: 0.54 mg/dL (ref 0.44–1.00)
GFR calc Af Amer: >60 mL/min (ref >60)
GFR calc non Af Amer: >60 mL/min (ref >60)
Glucose, Bld: 168 mg/dL — ABNORMAL HIGH (ref 65–99)
Potassium: 3.5 mmol/L (ref 3.5–5.1)
Sodium: 131 mmol/L — ABNORMAL LOW (ref 135–145)
Total Bilirubin: 0.9 mg/dL (ref 0.3–1.2)
Total Protein: 6.5 g/dL (ref 6.5–8.1)

## 2015-06-01 LAB — CBC
HCT: 30.2 % — ABNORMAL LOW (ref 35.0–47.0)
Hemoglobin: 9.7 g/dL — ABNORMAL LOW (ref 12.0–16.0)
MCH: 30.2 pg (ref 26.0–34.0)
MCHC: 32.1 g/dL (ref 32.0–36.0)
MCV: 93.9 fL (ref 80.0–100.0)
Platelets: 135 10*3/uL — ABNORMAL LOW (ref 150–440)
RBC: 3.21 MIL/uL — ABNORMAL LOW (ref 3.80–5.20)
RDW: 20.1 % — ABNORMAL HIGH (ref 11.5–14.5)
WBC: 5.2 10*3/uL (ref 3.6–11.0)

## 2015-06-01 LAB — AMMONIA: Ammonia: 18 umol/L (ref 9–35)

## 2015-06-01 LAB — APTT: aPTT: 39 seconds — ABNORMAL HIGH (ref 24–36)

## 2015-06-01 MED ORDER — OXYCODONE HCL 5 MG PO TABS
10.0000 mg | ORAL_TABLET | Freq: Once | ORAL | Status: AC
  Administered 2015-06-01: 10 mg via ORAL

## 2015-06-01 MED ORDER — OXYCODONE HCL 5 MG PO TABS
ORAL_TABLET | ORAL | Status: AC
  Administered 2015-06-01: 10 mg via ORAL
  Filled 2015-06-01: qty 2

## 2015-06-01 NOTE — ED Notes (Signed)
Patient states she has had a total of 8L drained (4L last Tuesday and 4 the Thursday before that). Patient was admitted both times.

## 2015-06-01 NOTE — ED Notes (Addendum)
Pt had 4 L drained from abdomen Tuesday and went home (hx liver failure).  Also had 4 L drained week before.  Has continued to have abdominal swelling and leg swelling since procedure.  Legs are weeping.  No fevers.

## 2015-06-01 NOTE — Discharge Instructions (Signed)
Please seek medical attention for any high fevers, chest pain, shortness of breath, change in behavior, persistent vomiting, bloody stool or any other new or concerning symptoms.   Ascites Ascites is a collection of excess fluid in the abdomen. Ascites can range from mild to severe. It can get worse without treatment. CAUSES Possible causes include:  Cirrhosis. This is the most common cause of ascites.  Infection or inflammation in the abdomen.  Cancer in the abdomen.  Heart failure.  Kidney disease.  Inflammation of the pancreas.  Clots in the veins of the liver. SIGNS AND SYMPTOMS Signs and symptoms may include:  A feeling of fullness in your abdomen. This is common.  An increase in the size of your abdomen or your waist.  Swelling in your legs.  Swelling of the scrotum in men.  Difficulty breathing.  Abdominal pain.  Sudden weight gain. If the condition is mild, you may not have symptoms. DIAGNOSIS To make a diagnosis, your health care provider will:  Ask about your medical history.  Perform a physical exam.  Order imaging tests, such as an ultrasound or CT scan of your abdomen. TREATMENT Treatment depends on the cause of the ascites. It may include:  Taking a pill to make you urinate. This is called a water pill (diuretic pill).  Strictly reducing your salt (sodium) intake. Salt can cause extra fluid to be kept in the body, and this makes ascites worse.  Having a procedure to remove fluid from your abdomen (paracentesis).  Having a procedure to transfer fluid from your abdomen into a vein.  Having a procedure that connects two of the major veins within your liver and relieves pressure on your liver (TIPS procedure). Ascites may go away or improve with treatment of the condition that caused it.  HOME CARE INSTRUCTIONS  Keep track of your weight. To do this, weigh yourself at the same time every day and record your weight.  Keep track of how much you  drink and any changes in the amount you urinate.  Follow any instructions that your health care provider gives you about how much to drink.  Try not to eat salty (high-sodium) foods.  Take medicines only as directed by your health care provider.  Keep all follow-up visits as directed by your health care provider. This is important.  Report any changes in your health to your health care provider, especially if you develop new symptoms or your symptoms get worse. SEEK MEDICAL CARE IF:  Your gain more than 3 pounds in 3 days.  Your abdominal size or your waist size increases.  You have new swelling in your legs.  The swelling in your legs gets worse. SEEK IMMEDIATE MEDICAL CARE IF:  You develop a fever.  You develop confusion.  You develop new or worsening difficulty breathing.  You develop new or worsening abdominal pain.  You develop new or worsening swelling in the scrotum (in men).   This information is not intended to replace advice given to you by your health care provider. Make sure you discuss any questions you have with your health care provider.   Document Released: 03/15/2005 Document Revised: 04/05/2014 Document Reviewed: 10/12/2013 Elsevier Interactive Patient Education [employer].

## 2015-06-01 NOTE — ED Provider Notes (Signed)
[facility] Emergency Department Provider Note    ____________________________________________  Time seen: ~1300  I have reviewed the triage vital signs and the nursing notes.   HISTORY  Chief Complaint abdominal swelling    History limited by: Not Limited   HPI Audrey Garrett is a 55 y.o. female  with history of cirrhosis and ascites who presents to the emergency department today because of concerns for increased swelling and leakage around a recent therapeutic paracentesis site. Patient has had 2 admissions in the past few weeks for therapeutic paracentesis. She saw her liver doctor on Friday. He did it start her on antibiotics for concerns for but we presume to be cellulitis. The patient states that she has continued to have increasing abdominal swelling since then. In addition she is having swelling to her bilateral lower extremities. She denies any fevers.    Past Medical History  Diagnosis Date  . Depression   . Anxiety   . High risk medications (not anticoagulants) long-term use   . Vitamin D deficiency disease   . Lumbago   . Hypertension   . Chronic hepatitis C ([place])   . Alcohol abuse   . Bronchitis   . Arthropathy   . Back pain   . Shoulder pain   . Stress headaches   . Hyperglycemia   . SOB (shortness of breath)   . Muscle spasm   . Edema   . Cirrhosis ([place])   . Polyarthritis   . Fatigue   . Sinusitis   . RLS (restless legs syndrome)   . Alcoholic cirrhosis of liver without ascites ([place])   . Reflux   . Fibromyalgia   . Spine disorder   . Chronic LBP 12/25/2014    Overview:  Post extensive surgery   . DDD (degenerative disc disease), lumbar 01/01/2015  . Lumbar radicular pain 01/01/2015  . Neck pain 01/01/2015  . Left leg pain 01/01/2015  . Sacroiliac pain 01/01/2015  . Upper back pain 01/01/2015  . Urinary frequency     Patient Active Problem List   Diagnosis Date Noted  . Depression, major, recurrent, moderate ([place]) 05/27/2015   . Alcohol abuse 05/27/2015  . Abdominal pain, chronic, epigastric 05/27/2015  . Difficulty urinating 05/26/2015  . Nephrolithiasis 05/26/2015  . Alcoholic cirrhosis of liver with ascites ([place]) 05/26/2015  . Liver cirrhosis, alcoholic ([place]) 85/27/7824  . Hepatitis C 05/22/2015  . Hypotension 05/22/2015  . Anemia 05/22/2015  . Thrombocytopenia ([place]) 05/22/2015  . Coagulopathy ([place]) 05/22/2015  . Hyperglycemia 05/22/2015  . Polyneuropathy ([place]) 05/22/2015  . Urinary retention 05/22/2015  . Ascites 05/21/2015  . Hypomagnesemia (low magnesium levels) 04/07/2015  . Chronic pain 04/03/2015  . At risk for osteopenia 04/03/2015  . Lumbar spondylosis 04/03/2015  . Chronic lumbar radicular pain (Left) 04/03/2015  . Chronic neck pain 04/03/2015  . Chronic lower extremity pain (Left) 04/03/2015  . Chronic sacroiliac joint pain 04/03/2015  . Chronic upper back pain 04/03/2015  . Long term current use of opiate analgesic 04/03/2015  . Encounter for chronic pain management 04/03/2015  . Bilateral low back pain with left-sided sciatica 01/01/2015  . Chronic pain syndrome 01/01/2015  . Cervical radicular pain 01/01/2015  . Other long term (current) drug therapy 01/01/2015  . Uncomplicated opioid dependence ([place]) 01/01/2015  . Long term prescription opiate use 01/01/2015  . Lumbar facet arthropathy 01/01/2015  . 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 Lumbar facet syndrome (Bilateral) 01/01/2015  . Osteoarthritis of spine with radiculopathy, lumbar region 01/01/2015  .  Pain of paraspinal muscle 01/01/2015  . Low back pain with radiation 01/01/2015  . Opiate use 01/01/2015  . Encounter for therapeutic drug level monitoring 01/01/2015  . Insomnia, persistent 12/25/2014  . Major depression in remission ([place]) 12/25/2014  . BP (high blood pressure) 12/25/2014  . Encounter for general adult medical examination without abnormal findings 12/25/2014  . HCV (hepatitis C virus) 12/25/2014  .  ACUTE SINUSITIS, UNSPECIFIED 11/01/2009  . BRONCHITIS, ACUTE 11/01/2009    Past Surgical History  Procedure Laterality Date  . Tubal ligation    . Tonsillectomy    . Back surgery  12/19/2011  . Esophagogastroduodenoscopy N/A 04/14/2015    Procedure: ESOPHAGOGASTRODUODENOSCOPY (EGD);  Surgeon: Lollie Sails, MD;  Location: [facility] ENDOSCOPY;  Service: Endoscopy;  Laterality: N/A;    Current Outpatient Rx  Name  Route  Sig  Dispense  Refill  . furosemide (LASIX) 40 MG tablet   Oral   Take 40 mg by mouth daily.          Marland Kitchen lactulose (CHRONULAC) 10 GM/15ML solution   Oral   Take 45 mLs (30 g total) by mouth daily.   240 mL   0   . magnesium gluconate (MAGONATE) 500 MG tablet   Oral   Take 500 mg by mouth daily.         . mirtazapine (REMERON) 15 MG tablet   Oral   Take 1 tablet (15 mg total) by mouth at bedtime.   30 tablet   1   . Multiple Vitamins-Minerals (MULTIVITAMIN WITH MINERALS) tablet   Oral   Take 1 tablet by mouth daily.         . Oxycodone HCl 20 MG TABS   Oral   Take 20 mg by mouth every 6 (six) hours as needed (for pain).         . pregabalin (LYRICA) 25 MG capsule   Oral   Take 1 capsule (25 mg total) by mouth 2 (two) times daily.   60 capsule   1   . spironolactone (ALDACTONE) 25 MG tablet   Oral   Take 25 mg by mouth daily.            Allergies Review of patient's allergies indicates no known allergies.  Family History  Problem Relation Age of Onset  . Stroke Mother   . Heart disease Mother   . [name] Father   . Kidney disease Neg Hx   . Bladder Cancer Neg Hx     Social History Social History  Substance Use Topics  . Smoking status: Current Every Day Smoker -- 1.00 packs/day for 30 years    Types: Cigarettes  . Smokeless tobacco: None  . Alcohol Use: 0.0 oz/week    0 Standard drinks or equivalent per week     Comment: but 10 days post stopping    Review of Systems  Constitutional: Negative for  fever. Cardiovascular: Negative for chest pain. Respiratory: Negative for shortness of breath. Gastrointestinal: Positive for abdominal pain, swelling Neurological: Negative for headaches, focal weakness or numbness.   10-point ROS otherwise negative.  ____________________________________________   PHYSICAL EXAM:  VITAL SIGNS: ED Triage Vitals  Enc Vitals Group     BP 06/01/15 1025 128/71 mmHg     Pulse Rate 06/01/15 1025 100     Resp 06/01/15 1025 20     Temp 06/01/15 1025 98.9 F (37.2 C)     Temp Source 06/01/15 1025 Oral     SpO2 06/01/15 1025 100 %  Weight 06/01/15 1025 190 lb (86.183 kg)     Height 06/01/15 1025 6' (1.829 m)     Head Cir --      Peak Flow --      Pain Score 06/01/15 1027 9   Constitutional: Alert and oriented. Somewhat anxious. Eyes: Conjunctivae are normal. PERRL. Normal extraocular movements. ENT   Head: Normocephalic and atraumatic.   Nose: No congestion/rhinnorhea.   Mouth/Throat: Mucous membranes are moist.   Neck: No stridor. Hematological/Lymphatic/Immunilogical: No cervical lymphadenopathy. Cardiovascular: Normal rate, regular rhythm.  No murmurs, rubs, or gallops. Respiratory: Normal respiratory effort without tachypnea nor retractions. Breath sounds are clear and equal bilaterally. No wheezes/rales/rhonchi. Gastrointestinal: Soft. Distended. Small amount of leakage at site of paracentesis on the right side of the abdomen. Some redness surrounding this site and tracking across midline of abdomen. Somewhat diffusely tender to palpation.  Genitourinary: Deferred Musculoskeletal: Normal range of motion in all extremities. No joint effusions.  bilateral lower extremity edema, weeping.  Neurologic:  Normal speech and language. No gross focal neurologic deficits are appreciated.   ____________________________________________    LABS (pertinent positives/negatives)  Labs Reviewed  COMPREHENSIVE METABOLIC PANEL - Abnormal;  Notable for the following:    Sodium 131 (*)    Glucose, Bld 168 (*)    BUN <5 (*)    Calcium 8.0 (*)    Albumin 2.6 (*)    AST 67 (*)    All other components within normal limits  CBC - Abnormal; Notable for the following:    RBC 3.21 (*)    Hemoglobin 9.7 (*)    HCT 30.2 (*)    RDW 20.1 (*)    Platelets 135 (*)    All other components within normal limits  APTT - Abnormal; Notable for the following:    aPTT 39 (*)    All other components within normal limits  PROTIME-INR - Abnormal; Notable for the following:    Prothrombin Time 17.7 (*)    All other components within normal limits  AMMONIA  URINALYSIS COMPLETEWITH MICROSCOPIC ([facility] ONLY)     ____________________________________________   EKG  I, Nance Pear, attending physician, personally viewed and interpreted this EKG  EKG Time: 1034 Rate: 83 Rhythm: normal sinus rhythm Axis: normal Intervals: qtc 439 QRS: low voltage, narrow, q waves V1, V2 ST changes: no st elevation Impression: abnormal ekg  ____________________________________________    RADIOLOGY  CXR IMPRESSION: No active cardiopulmonary disease.  ____________________________________________   PROCEDURES  Procedure(s) performed: laceration repair, see procedure note(s).  Critical Care performed: No  LACERATION REPAIR Performed by: Nance Pear Authorized by: Nance Pear Consent: Verbal consent obtained. Risks and benefits: risks, benefits and alternatives were discussed Consent given by: patient Patient identity confirmed: provided demographic data Prepped and Draped in normal sterile fashion Wound explored  Laceration Location: Right abdomen  Laceration Length: .5 cm  No Foreign Bodies seen or palpated  No anethetic  Skin closure: Dermabond  Patient tolerance: Patient tolerated the procedure well with no immediate complications.  ____________________________________________   INITIAL IMPRESSION /  ASSESSMENT AND PLAN / ED COURSE  Pertinent labs & imaging results that were available during my care of the patient were reviewed by me and considered in my medical decision making (see chart for details).  Patient presented to the emergency department today with concerns for leakage around the site of a therapeutic paracentesis, increased abdominal swelling in lower extremity edema. On exam patient does have bilateral lower extremity edema. Her legs and calves were nontender. There  was a small amount of weeping coming from the right shin. No erythema. I doubt that the edema secondary to blood clots. I think more likely patient is suffering from edema secondary to liver cirrhosis. Terms of her abdominal exam patient is somewhat diffusely tender. She did have some redness concerning for cellulitis extending from the side of the paracentesis to the mid abdomen. I did discuss possibility of SBP with the patient. At this point given lack of fever and lack of leukocytosis I think SBP is unlikely. Furthermore given cellulitic appearance of the skin I would have concern about introducing an infection into the abdomen. I discussed this risk with the patient and she did not want a diagnostic paracentesis at this point. I instructed the patient to follow up with Dr. Donnella Sham. Furthermore I did place dermabond over the leaking site of the previous paracentesis.   ____________________________________________   FINAL CLINICAL IMPRESSION(S) / ED DIAGNOSES  Final diagnoses:  Ascites  Bilateral edema of lower extremity     Nance Pear, MD 06/01/15 1458

## 2015-06-01 NOTE — ED Notes (Signed)
Pt fell going up stairs this am. Did hit head; denies LOC.

## 2015-06-02 LAB — BODY FLUID CULTURE: Culture: NO GROWTH

## 2015-06-12 ENCOUNTER — Encounter
Admission: RE | Admit: 2015-06-12 | Discharge: 2015-06-12 | Disposition: A | Discharge status: Home / Self Care | Payer: Medicare Other | Source: Ambulatory Visit | Attending: Gastroenterology | Admitting: Gastroenterology

## 2015-06-12 DIAGNOSIS — D649 Anemia, unspecified: Secondary | ICD-10-CM | POA: Insufficient documentation

## 2015-06-12 DIAGNOSIS — K7460 Unspecified cirrhosis of liver: Secondary | ICD-10-CM | POA: Insufficient documentation

## 2015-06-12 LAB — CBC
HCT: 23.7 % — ABNORMAL LOW (ref 35.0–47.0)
Hemoglobin: 7.8 g/dL — ABNORMAL LOW (ref 12.0–16.0)
MCH: 29.4 pg (ref 26.0–34.0)
MCHC: 32.9 g/dL (ref 32.0–36.0)
MCV: 89.2 fL (ref 80.0–100.0)
Platelets: 166 10*3/uL (ref 150–440)
RBC: 2.66 MIL/uL — ABNORMAL LOW (ref 3.80–5.20)
RDW: 19.8 % — ABNORMAL HIGH (ref 11.5–14.5)
WBC: 6.8 10*3/uL (ref 3.6–11.0)

## 2015-06-12 LAB — DIFFERENTIAL
Basophils Absolute: 0.1 10*3/uL (ref 0–0.1)
Basophils Relative: 1 %
Eosinophils Absolute: 0.2 10*3/uL (ref 0–0.7)
Eosinophils Relative: 2 %
Lymphocytes Relative: 30 %
Lymphs Abs: 2 10*3/uL (ref 1.0–3.6)
Monocytes Absolute: 1.1 10*3/uL — ABNORMAL HIGH (ref 0.2–0.9)
Monocytes Relative: 16 %
Neutro Abs: 3.5 10*3/uL (ref 1.4–6.5)
Neutrophils Relative %: 51 %

## 2015-06-12 LAB — BASIC METABOLIC PANEL
Anion gap: 5 (ref 5–15)
BUN: 6 mg/dL (ref 6–20)
CO2: 26 mmol/L (ref 22–32)
Calcium: 8.1 mg/dL — ABNORMAL LOW (ref 8.9–10.3)
Chloride: 96 mmol/L — ABNORMAL LOW (ref 101–111)
Creatinine, Ser: 0.56 mg/dL (ref 0.44–1.00)
GFR calc Af Amer: >60 mL/min (ref >60)
GFR calc non Af Amer: >60 mL/min (ref >60)
Glucose, Bld: 158 mg/dL — ABNORMAL HIGH (ref 65–99)
Potassium: 3.9 mmol/L (ref 3.5–5.1)
Sodium: 127 mmol/L — ABNORMAL LOW (ref 135–145)

## 2015-06-12 LAB — PREPARE RBC (CROSSMATCH)

## 2015-06-12 NOTE — Pre-Procedure Instructions (Signed)
Pt here for Lab work.  CBC, Met B and T&S drawn for pt to receive 1 unit of blood tomorrow in SDS at 0800.  Pt instructed to register at admitting desk between [place] and 0730 then proceed up to SDS for blood transfusion at 0800.  Pt verbalized she understands where to register and where SDS is located and when to arrive tomorrow morning.

## 2015-06-13 ENCOUNTER — Ambulatory Visit
Admission: RE | Admit: 2015-06-13 | Discharge: 2015-06-13 | Disposition: A | Discharge status: Home / Self Care | Payer: Medicare Other | Source: Ambulatory Visit | Attending: Gastroenterology | Admitting: Gastroenterology

## 2015-06-13 DIAGNOSIS — K7460 Unspecified cirrhosis of liver: Secondary | ICD-10-CM | POA: Diagnosis not present

## 2015-06-13 MED ORDER — SODIUM CHLORIDE 0.9 % IV SOLN: Freq: Once | INTRAVENOUS | Status: DC

## 2015-06-14 LAB — TYPE AND SCREEN
ABO/RH(D): O POS
Antibody Screen: NEGATIVE
Extend sample reason: TRANSFUSED
Unit division: 0

## 2015-06-27 ENCOUNTER — Encounter: Admission: RE | Payer: Self-pay | Source: Ambulatory Visit

## 2015-06-27 ENCOUNTER — Ambulatory Visit
Admission: RE | Admit: 2015-06-27 | Discharge: 2015-06-27 | Disposition: A | Discharge status: Home / Self Care | Payer: Medicare Other | Source: Ambulatory Visit | Attending: Gastroenterology | Admitting: Gastroenterology

## 2015-06-27 ENCOUNTER — Ambulatory Visit: Admission: RE | Admit: 2015-06-27 | Payer: Medicare Other | Source: Ambulatory Visit | Admitting: Gastroenterology

## 2015-06-27 DIAGNOSIS — F17200 Nicotine dependence, unspecified, uncomplicated: Secondary | ICD-10-CM | POA: Diagnosis not present

## 2015-06-27 DIAGNOSIS — Z79899 Other long term (current) drug therapy: Secondary | ICD-10-CM | POA: Insufficient documentation

## 2015-06-27 DIAGNOSIS — K7030 Alcoholic cirrhosis of liver without ascites: Secondary | ICD-10-CM | POA: Diagnosis present

## 2015-06-27 LAB — HEMOGLOBIN AND HEMATOCRIT, BLOOD
HCT: 22 % — ABNORMAL LOW (ref 35.0–47.0)
Hemoglobin: 7.2 g/dL — ABNORMAL LOW (ref 12.0–16.0)

## 2015-06-27 LAB — PREPARE RBC (CROSSMATCH)

## 2015-06-27 SURGERY — ESOPHAGOGASTRODUODENOSCOPY (EGD) WITH PROPOFOL
Anesthesia: General

## 2015-06-27 MED ORDER — SODIUM CHLORIDE 0.9 % IV SOLN: Freq: Once | INTRAVENOUS | Status: DC

## 2015-06-27 NOTE — Progress Notes (Signed)
Lab tech in for blood draw 0955 am

## 2015-06-28 LAB — TYPE AND SCREEN
ABO/RH(D): O POS
Antibody Screen: NEGATIVE
Unit division: 0
Unit division: 0

## 2015-07-01 ENCOUNTER — Ambulatory Visit: Payer: Medicare Other | Admitting: Anesthesiology

## 2015-07-01 ENCOUNTER — Encounter
Admission: RE | Disposition: A | Discharge status: Home / Self Care | Payer: Self-pay | Source: Ambulatory Visit | Attending: Gastroenterology

## 2015-07-01 ENCOUNTER — Ambulatory Visit
Admission: RE | Admit: 2015-07-01 | Discharge: 2015-07-01 | Disposition: A | Discharge status: Home / Self Care | Payer: Medicare Other | Source: Ambulatory Visit | Attending: Gastroenterology | Admitting: Gastroenterology

## 2015-07-01 DIAGNOSIS — D649 Anemia, unspecified: Secondary | ICD-10-CM | POA: Diagnosis present

## 2015-07-01 DIAGNOSIS — K259 Gastric ulcer, unspecified as acute or chronic, without hemorrhage or perforation: Secondary | ICD-10-CM | POA: Diagnosis present

## 2015-07-01 DIAGNOSIS — Z539 Procedure and treatment not carried out, unspecified reason: Secondary | ICD-10-CM | POA: Insufficient documentation

## 2015-07-01 LAB — CBC
HCT: 27 % — ABNORMAL LOW (ref 35.0–47.0)
Hemoglobin: 8.9 g/dL — ABNORMAL LOW (ref 12.0–16.0)
MCH: 29 pg (ref 26.0–34.0)
MCHC: 32.9 g/dL (ref 32.0–36.0)
MCV: 88.2 fL (ref 80.0–100.0)
Platelets: 118 10*3/uL — ABNORMAL LOW (ref 150–440)
RBC: 3.06 MIL/uL — ABNORMAL LOW (ref 3.80–5.20)
RDW: 20.1 % — ABNORMAL HIGH (ref 11.5–14.5)
WBC: 5.9 10*3/uL (ref 3.6–11.0)

## 2015-07-01 LAB — PROTIME-INR
INR: 1.46
Prothrombin Time: 17.8 seconds — ABNORMAL HIGH (ref 11.4–15.0)

## 2015-07-01 SURGERY — ESOPHAGOGASTRODUODENOSCOPY (EGD) WITH PROPOFOL
Anesthesia: General

## 2015-07-01 MED ORDER — SODIUM CHLORIDE 0.9 % IV SOLN: INTRAVENOUS | Status: DC

## 2015-07-01 NOTE — Anesthesia Preprocedure Evaluation (Deleted)
Anesthesia Evaluation  Patient identified by MRN, date of birth, ID band Patient awake    Reviewed: Allergy & Precautions, H&P , NPO status , [name] Chart, lab work & pertinent test results, reviewed documented beta blocker date and time   History of Anesthesia Complications Negative for: history of anesthetic complications  Airway Mallampati: I  TM Distance: >3 FB Neck ROM: full    Dental no notable dental hx. (+) Edentulous Upper, Edentulous Lower, Upper Dentures, Lower Dentures   Pulmonary neg shortness of breath, neg sleep apnea, neg COPD, neg recent URI, Current Smoker,    Pulmonary exam normal breath sounds clear to auscultation       Cardiovascular Exercise Tolerance: Good hypertension, (-) angina(-) CAD, (-) Past MI, (-) Cardiac Stents and (-) CABG Normal cardiovascular exam(-) dysrhythmias (-) Valvular Problems/Murmurs Rhythm:regular Rate:Normal     Neuro/Psych neg Seizures PSYCHIATRIC DISORDERS (Depression and anxiety)  Neuromuscular disease (fibromyalgia)    GI/Hepatic negative GI ROS, (+) Cirrhosis   ascites  substance abuse  alcohol use, Hepatitis -, C  Endo/Other  negative endocrine ROS  Renal/GU Renal disease (kidney stones)  negative genitourinary   Musculoskeletal   Abdominal   Peds  Hematology  (+) anemia ,   Anesthesia Other Findings Past Medical History:   Depression                                                   Anxiety                                                      High risk medications (not anticoagulants) lon*              Vitamin D deficiency disease                                 Lumbago                                                      Hypertension                                                 Chronic hepatitis C (HCC)                                    Alcohol abuse                                                Bronchitis  Arthropathy                                                  Back pain                                                    Shoulder pain                                                Stress headaches                                             Hyperglycemia                                                SOB (shortness of breath)                                    Muscle spasm                                                 Edema                                                        Cough                                                        Polyarthritis                                                Fatigue                                                      Sinusitis  RLS (restless legs syndrome)                                 Alcoholic cirrhosis of liver without ascites (*              Reflux                                                       Fibromyalgia                                                 Spine disorder                                               Chronic LBP                                     12/25/2014      Comment:Overview:  Post extensive surgery    DDD (degenerative disc disease), lumbar         01/01/2015    Lumbar radicular pain                           01/01/2015    Neck pain                                       01/01/2015    Left leg pain                                   01/01/2015    Sacroiliac pain                                 01/01/2015    Upper back pain                                 01/01/2015    Reproductive/Obstetrics negative OB ROS                             Anesthesia Physical  Anesthesia Plan  ASA: IV  Anesthesia Plan: General   Post-op Pain Management:    Induction:   Airway Management Planned:   Additional Equipment:   Intra-op Plan:   Post-operative Plan:   Informed Consent: I have reviewed the patients History and Physical, chart, labs and  discussed the procedure including the risks, benefits and alternatives for the proposed anesthesia with the patient or authorized representative who has indicated his/her understanding and acceptance.   Dental Advisory Given  Plan Discussed with: Anesthesiologist, CRNA and Surgeon  Anesthesia Plan  Comments:         Anesthesia Quick Evaluation

## 2015-07-02 ENCOUNTER — Other Ambulatory Visit
Admission: RE | Admit: 2015-07-02 | Discharge: 2015-07-02 | Disposition: A | Discharge status: Home / Self Care | Payer: Medicare Other | Source: Ambulatory Visit | Attending: Pain Medicine | Admitting: Pain Medicine

## 2015-07-02 ENCOUNTER — Ambulatory Visit (HOSPITAL_BASED_OUTPATIENT_CLINIC_OR_DEPARTMENT_OTHER): Payer: Medicare Other | Admitting: Pain Medicine

## 2015-07-02 ENCOUNTER — Encounter: Payer: Self-pay | Admitting: Pain Medicine

## 2015-07-02 VITALS — BP 115/70 | HR 103 | Temp 99.0°F | Resp 18 | Ht 72.0 in | Wt 175.0 lb

## 2015-07-02 DIAGNOSIS — M5442 Lumbago with sciatica, left side: Secondary | ICD-10-CM | POA: Diagnosis not present

## 2015-07-02 DIAGNOSIS — F1190 Opioid use, unspecified, uncomplicated: Secondary | ICD-10-CM

## 2015-07-02 DIAGNOSIS — G8929 Other chronic pain: Secondary | ICD-10-CM | POA: Diagnosis not present

## 2015-07-02 DIAGNOSIS — Z79891 Long term (current) use of opiate analgesic: Secondary | ICD-10-CM

## 2015-07-02 DIAGNOSIS — Z5181 Encounter for therapeutic drug level monitoring: Secondary | ICD-10-CM | POA: Diagnosis not present

## 2015-07-02 DIAGNOSIS — F331 Major depressive disorder, recurrent, moderate: Secondary | ICD-10-CM

## 2015-07-02 DIAGNOSIS — M47816 Spondylosis without myelopathy or radiculopathy, lumbar region: Secondary | ICD-10-CM

## 2015-07-02 DIAGNOSIS — D696 Thrombocytopenia, unspecified: Secondary | ICD-10-CM

## 2015-07-02 DIAGNOSIS — M545 Low back pain: Secondary | ICD-10-CM

## 2015-07-02 DIAGNOSIS — Z539 Procedure and treatment not carried out, unspecified reason: Secondary | ICD-10-CM | POA: Diagnosis not present

## 2015-07-02 LAB — PLATELET FUNCTION ASSAY
Collagen / ADP: 141 seconds — ABNORMAL HIGH (ref 0–118)
Collagen / Epinephrine: 130 seconds (ref 0–193)

## 2015-07-02 LAB — PROTIME-INR
INR: 1.34
Prothrombin Time: 16.7 seconds — ABNORMAL HIGH (ref 11.4–15.0)

## 2015-07-02 LAB — PLATELET COUNT: Platelets: 115 10*3/uL — ABNORMAL LOW (ref 150–440)

## 2015-07-02 MED ORDER — NALOXONE HCL 4 MG/0.1ML NA LIQD: 1.0000 | Freq: Once | NASAL | Status: DC

## 2015-07-02 MED ORDER — OXYCODONE HCL 20 MG PO TABS: 20.0000 mg | ORAL_TABLET | Freq: Four times a day (QID) | ORAL | Status: DC | PRN

## 2015-07-02 NOTE — Progress Notes (Signed)
Quick Note:  Abnormal test suggesting poor platelet function. This could lead to increased risk of bleeding. ______

## 2015-07-02 NOTE — Progress Notes (Signed)
[name] Name: Audrey Garrett Patient type: Established  MRN: [id number] Service setting: Ambulatory outpatient  DOB: 07-14-1960   DOS: 07/02/2015    Primary Reason(s) for Visit: Encounter for prescription drug management (Level of risk: moderate) CC: Back Pain; Neck Pain; Leg Pain; and Foot Pain   HPI  Audrey Garrett is a 55 y.o. year old, female patient, who returns today as an established patient. She has ACUTE SINUSITIS, UNSPECIFIED; BRONCHITIS, ACUTE; Insomnia, persistent; Major depression in remission ([place]); BP (high blood pressure); Bilateral low back pain with left-sided sciatica; Chronic pain syndrome; Cervical radicular pain; Other long term (current) drug therapy; Uncomplicated opioid dependence ([place]); Long term prescription opiate use; Lumbar facet arthropathy; Failed back surgical syndrome; Lumbar facet syndrome (Bilateral); Osteoarthritis of spine with radiculopathy, lumbar region; Pain of paraspinal muscle; Low back pain with radiation; Opiate use (120 MME/Day); Encounter for therapeutic drug level monitoring; Encounter for general adult medical examination without abnormal findings; HCV (hepatitis C virus); Chronic pain; At risk for osteopenia; Lumbar spondylosis; Chronic lumbar radicular pain (Left); Chronic neck pain; Chronic lower extremity pain (Left); Chronic sacroiliac joint pain; Chronic upper back pain; Long term current use of opiate analgesic; Encounter for chronic pain management; Hypomagnesemia (low magnesium levels); Ascites; Liver cirrhosis, alcoholic ([place]); Hepatitis C; Hypotension; Anemia; Thrombocytopenia ([place]); Coagulopathy ([place]); Hyperglycemia; Polyneuropathy ([place]); Urinary retention; Difficulty urinating; Nephrolithiasis; Alcoholic cirrhosis of liver with ascites ([place]); Depression, major, recurrent, moderate ([place]); Alcohol abuse; Abdominal pain, chronic, epigastric; and Hepatic cirrhosis (HCC) on her problem list.. Her primarily concern today is the Back Pain; Neck Pain; Leg Pain;  and Foot Pain   Pain Assessment: Self-Reported Pain Score: 7 , clinically she looks like a 2-3/10. However, she does look very depressed. Reported level is compatible with observation Pain Type: Chronic pain Pain Location: Back Pain Orientation: Lower Pain Descriptors / Indicators: Sharp, Burning, Dull Pain Frequency: Constant  The patient comes in today clinics today for pharmacological management of her chronic pain. She was recently diagnosed with stage IV liver disease. This creates several problems for [place] including that of coagulopathy.  Date of Last Visit: 04/03/15 Service Provided on Last Visit: Med Refill  Controlled Substance Pharmacotherapy Assessment  Analgesic: Oxycodone IR 20 mg every 6 (80 mg/day) Pill Count: Oxycodone pill count # 32/240 Filled 06-06-15 MME/day: 120 mg/day.  Pharmacokinetics: Onset of action (Liberation/Absorption): Within expected pharmacological parameters Time to Peak effect (Distribution): Timing and results are as within normal expected parameters Duration of action (Metabolism/Excretion): Within normal limits for medication Pharmacodynamics: Analgesic Effect: More than 50% Activity Facilitation: Medication(s) allow patient to sit, stand, walk, and do the basic ADLs Perceived Effectiveness: Described as relatively effective, allowing for increase in activities of daily living (ADL) Side-effects or Adverse reactions: None reported Monitoring: [place] PMP: Online review of the past 25-monthperiod conducted. Compliant with practice rules and regulations UDS Results/interpretation: The patient's last UDS was done on 04/03/2015 and it was read as abnormal due to the presence of tramadol which had not been declared. Medication Assessment Form: Reviewed. Patient indicates being compliant with therapy Treatment compliance: Compliant Risk Assessment: Aberrant Behavior: None observed today Substance Use Disorder (SUD) Risk Level: Low Risk of opioid abuse or  dependence: 0.7-3.0% with doses ? 36 MME/day and 6.1-26% with doses ? 120 MME/day. Opioid Risk Tool (ORT) Score: Total Score: 1 Low Risk for SUD (Score <3) Depression Scale Score: PHQ-2: PHQ-2 Total Score: 4 45.5% Probability of major depressive disorder (4) PHQ-9: PHQ-9 Total Score: 14 Moderate depression (10-14)  Pharmacologic Plan: No change  in therapy, at this time  Laboratory Chemistry  Inflammation Markers Lab Results  Component Value Date   ESRSEDRATE 27 04/03/2015   CRP <0.5 04/03/2015    Renal Function Lab Results  Component Value Date   BUN 6 06/12/2015   CREATININE 0.56 06/12/2015   GFRAA >60 06/12/2015   GFRNONAA >60 06/12/2015    Hepatic Function Lab Results  Component Value Date   AST 67* 06/01/2015   ALT 36 06/01/2015   ALBUMIN 2.6* 06/01/2015    Electrolytes Lab Results  Component Value Date   NA 127* 06/12/2015   K 3.9 06/12/2015   CL 96* 06/12/2015   CALCIUM 8.1* 06/12/2015   MG 1.5* 05/26/2015    Pain Modulating Vitamins Lab Results  Component Value Date   VITAMINB12 686 04/03/2015    Coagulation Parameters Lab Results  Component Value Date   INR 1.46 07/01/2015   LABPROT 17.8* 07/01/2015    Note: I personally reviewed the above data. Results shared with patient.  Meds  The patient has a current medication list which includes the following prescription(s): ciprofloxacin, fluticasone, furosemide, lactulose, lisinopril, magnesium, mirtazapine, multivitamin with minerals, pantoprazole, phytonadione, pregabalin, spironolactone, naloxone hcl, oxycodone hcl, oxycodone hcl, and oxycodone hcl.  Current Outpatient Prescriptions on File Prior to Visit  Medication Sig  . furosemide (LASIX) 40 MG tablet Take 40 mg by mouth daily.   Marland Kitchen lactulose (CHRONULAC) 10 GM/15ML solution Take 45 mLs (30 g total) by mouth daily.  Marland Kitchen lisinopril (PRINIVIL,ZESTRIL) 20 MG tablet Take 20 mg by mouth daily.  . mirtazapine (REMERON) 15 MG tablet Take 1 tablet (15 mg  total) by mouth at bedtime.  . Multiple Vitamins-Minerals (MULTIVITAMIN WITH MINERALS) tablet Take 1 tablet by mouth daily.  . pantoprazole (PROTONIX) 40 MG tablet Take 40 mg by mouth daily.  . pregabalin (LYRICA) 25 MG capsule Take 1 capsule (25 mg total) by mouth 2 (two) times daily.   No current facility-administered medications on file prior to visit.    ROS  Constitutional: Afebrile, no chills, well hydrated and well nourished Gastrointestinal: No upper or lower GI bleeding, no nausea, no vomiting and no acute GI distress Musculoskeletal: No acute joint swelling or redness, no acute loss of range of motion and no acute onset weakness Neurological: Denies any acute onset apraxia, no episodes of paralysis, no acute loss of coordination, no acute loss of consciousness and no acute onset aphasia, dysarthria, agnosia, or amnesia  Allergies  Ms. Matthies has No Known Allergies.  [place]  Medical:  Ms. Holzman  has a past medical history of Depression; Anxiety; High risk medications (not anticoagulants) long-term use; Vitamin D deficiency disease; Lumbago; Hypertension; Chronic hepatitis C ([place]); Alcohol abuse; Bronchitis; Arthropathy; Back pain; Shoulder pain; Stress headaches; Hyperglycemia; SOB (shortness of breath); Muscle spasm; Edema; Cirrhosis ([place]); Polyarthritis; Fatigue; Sinusitis; RLS (restless legs syndrome); Alcoholic cirrhosis of liver without ascites ([place]); Reflux; Fibromyalgia; Spine disorder; Chronic LBP (12/25/2014); DDD (degenerative disc disease), lumbar (01/01/2015); Lumbar radicular pain (01/01/2015); Neck pain (01/01/2015); Left leg pain (01/01/2015); Sacroiliac pain (01/01/2015); Upper back pain (01/01/2015); Urinary frequency; and Anemia. Family: family history includes [name] in her father; Heart disease in her mother; Stroke in her mother. There is no history of Kidney disease or Bladder Cancer. Surgical:  has past surgical history that includes Tubal ligation; Tonsillectomy; Back  surgery (12/19/2011); and Esophagogastroduodenoscopy (N/A, 04/14/2015). Tobacco:  reports that she has been smoking Cigarettes.  She has a 15 pack-year smoking history. She does not have any smokeless tobacco history on  file. Alcohol:  reports that she drinks alcohol. Drug:  reports that she does not use illicit drugs.  Physical Examination  Constitutional Vitals:  Today's Vitals   07/02/15 1026 07/02/15 1028  BP: 115/70   Pulse: 103   Temp: 99 F (37.2 C)   Resp: 18   Height: 6' (1.829 m)   Weight: 175 lb (79.379 kg)   SpO2: 100%   PainSc: 7  7   PainLoc: Back    Calculated BMI: Body mass index is 23.73 kg/(m^2).    General appearance: alert, cooperative, appears stated age and mild distress Eyes: PERLA Respiratory: No evidence respiratory distress, no audible rales or ronchi and no use of accessory muscles of respiration   Cervical Spine Exam  Inspection: Normal anatomy, no anomalies observed Cervical Lordosis: Normal Alignment: Symetrical Functional ROM: Within functional limits (WFL) AROM: WFL Sensory: No sensory abnormalities reported  Upper Extremity Exam   Right  Left  Inspection: No gross anomalies detected Functional ROM: Within functional limits [facility])  Inspection: No gross anomalies detected Functional ROM: Within functional limits (WFL)  AROM: Adequate  AROM: Adequate  Sensory: Normal  Sensory: Normal  Motor: Unremarkable       Motor: Unremarkable        Thoracic Spine  Inspection: No gross anomalies detected Alignment: Symetrical AROM: Adequate  Lumbar Spne  Inspection: No gross anomalies detected Lumbar Lordosis: Normal Sacral Kyphosis: Normal Alignment: Symetrical Palpation: WNL AROM: Decreased Provocative Tests:  Lumbar Hyperextension and rotation test:  Positive for facet pain, bilaterally Patrick's Maneuver: deferred  Gait Assessment  Gait: WNL  Lower Extremities   Right  Left  Inspection: No gross anomalies detected Functional ROM:  Within functional limits [facility])  Inspection: No gross anomalies detected Functional ROM: Within functional limits (WFL)  AROM: Adequate  AROM: Adequate  Sensory:  Normal  Sensory:  Normal  Motor: Unremarkable       Motor: Unremarkable        Assessment & Plan  Primary Diagnosis & Pertinent Problem List: The primary encounter diagnosis was Chronic pain. Diagnoses of Encounter for therapeutic drug level monitoring, Long term current use of opiate analgesic, Bilateral low back pain with left-sided sciatica, Opiate use (120 MME/Day), Lumbar facet syndrome (Bilateral), Thrombocytopenia ([place]), and Depression, major, recurrent, moderate ([place]) were also pertinent to this visit.  Visit Diagnosis: 1. Chronic pain   2. Encounter for therapeutic drug level monitoring   3. Long term current use of opiate analgesic   4. Bilateral low back pain with left-sided sciatica   5. Opiate use (120 MME/Day)   6. Lumbar facet syndrome (Bilateral)   7. Thrombocytopenia ([place])   8. Depression, major, recurrent, moderate (HCC)     Problem-specific Plan(s): No problem-specific assessment & plan notes found for this encounter.   Plan of Care  Pharmacotherapy (Medications Ordered): Meds ordered this encounter  Medications  . Naloxone HCl (NARCAN) 4 MG/0.1ML LIQD    Sig: Place 1 Bottle into the nose once. , then call 911, repeat if needed in other nostril with new bottle.    Dispense:  2 each    Refill:  0    Please provide the patient with clear instructions on the use of this device/medication.  . Oxycodone HCl 20 MG TABS    Sig: Take 1 tablet (20 mg total) by mouth every 6 (six) hours as needed.    Dispense:  120 tablet    Refill:  0    Do not place this medication, or any other  prescription from our practice, on "Automatic Refill". Patient may have prescription filled one day early if pharmacy is closed on scheduled refill date. Do not fill until: 07/06/15 To last until: 08/05/15  . Oxycodone HCl 20 MG  TABS    Sig: Take 1 tablet (20 mg total) by mouth every 6 (six) hours as needed.    Dispense:  120 tablet    Refill:  0    Do not place this medication, or any other prescription from our practice, on "Automatic Refill". Patient may have prescription filled one day early if pharmacy is closed on scheduled refill date. Do not fill until: 08/05/15 To last until: 09/04/15  . Oxycodone HCl 20 MG TABS    Sig: Take 1 tablet (20 mg total) by mouth every 6 (six) hours as needed.    Dispense:  120 tablet    Refill:  0    Do not place this medication, or any other prescription from our practice, on "Automatic Refill". Patient may have prescription filled one day early if pharmacy is closed on scheduled refill date. Do not fill until: 09/04/15 To last until: 10/04/15    [facility] & Procedure Ordered: Orders Placed This Encounter  Procedures  . LUMBAR FACET(MEDIAL BRANCH NERVE BLOCK) MBNB  . ToxASSURE Select 13 (MW), Urine  . Platelet count  . Platelet function assay  . Protime-INR  . Ambulatory referral to Psychiatry    Imaging Ordered: AMB REFERRAL TO PSYCHIATRY  Interventional Therapies: Scheduled:  Diagnostic bilateral lumbar facet block under fluoroscopic guidance and IV sedation.    Considering:  Caudal epidural steroid injection under fluoroscopic guidance and IV sedation.    PRN Procedures:  None at this point.    Referral(s) or Consult(s): Psychiatry consult for depression.  New Prescriptions   NALOXONE HCL (NARCAN) 4 MG/0.1ML LIQD    Place 1 Bottle into the nose once. , then call 911, repeat if needed in other nostril with new bottle.   OXYCODONE HCL 20 MG TABS    Take 1 tablet (20 mg total) by mouth every 6 (six) hours as needed.   OXYCODONE HCL 20 MG TABS    Take 1 tablet (20 mg total) by mouth every 6 (six) hours as needed.   OXYCODONE HCL 20 MG TABS    Take 1 tablet (20 mg total) by mouth every 6 (six) hours as needed.    Medications administered during this visit: Ms.  Mandelbaum had no medications administered during this visit.  Future Appointments Date Time Provider [place]  10/01/2015 10:00 AM [name], MD [facility] None    Primary Care Physician: Volanda Napoleon, MD Location: [facility] Facility Note by: Kathlen Brunswick. Dossie Arbour, M.D, DABA, DABAPM, DABPM, DABIPP, FIPP  Pain Score Disclaimer: We use the NRS-11 scale. This is a self-reported, subjective measurement of pain severity with only modest accuracy. It is used primarily to identify changes within a particular patient. It must be understood that outpatient pain scales are significantly less accurate that those used for research, where they can be applied under ideal controlled circumstances with minimal exposure to variables. In reality, the score is likely to be a combination of pain intensity and pain affect, where pain affect describes the degree of emotional arousal or changes in action readiness caused by the sensory experience of pain. Factors such as social and work situation, setting, emotional state, anxiety levels, expectation, and prior pain experience may influence pain perception and show large inter-individual differences that may also be  affected by time variables.

## 2015-07-02 NOTE — Progress Notes (Signed)
Quick Note:  The patient remains with evidence of thrombocytopenia (low platelet count) with increased risk of bleeding. Low platelet count in addition to low platelet function is suggestive of increased risk of bleeding. In view of these of normal test, we will postpone any interventional pain management treatments until this can be corrected. ______

## 2015-07-02 NOTE — Progress Notes (Signed)
Quick Note:  Elevated prothrombin time may also be indicative of possible increased risk of bleeding. ______

## 2015-07-02 NOTE — Progress Notes (Signed)
Audrey Garrett was diagnosed with Stage 4 Liver disease, GI bleeding.  Has been inpatient twice. Has been to ED several times.  Patient crying talking about her illness.  Abdomen and legs is edematous.

## 2015-07-02 NOTE — Progress Notes (Signed)
Safety precautions to be maintained throughout the outpatient stay will include: orient to surroundings, keep bed in low position, maintain call bell within reach at all times, provide assistance with transfer out of bed and ambulation. Oxycodone pill count # 32/240  Filled 06-06-15

## 2015-07-02 NOTE — Patient Instructions (Addendum)
Smoking Cessation, Tips for Success If you are ready to quit smoking, congratulations! You have chosen to help yourself be healthier. Cigarettes bring nicotine, tar, carbon monoxide, and other irritants into your body. Your lungs, heart, and blood vessels will be able to work better without these poisons. There are many different ways to quit smoking. Nicotine gum, nicotine patches, a nicotine inhaler, or nicotine nasal spray can help with physical craving. Hypnosis, support groups, and medicines help break the habit of smoking. WHAT THINGS CAN I DO TO MAKE QUITTING EASIER?  Here are some tips to help you quit for good: 1. Pick a date when you will quit smoking completely. Tell all of your friends and family about your plan to quit on that date. 2. Do not try to slowly cut down on the number of cigarettes you are smoking. Pick a quit date and quit smoking completely starting on that day. 3. Throw away all cigarettes.  4. Clean and remove all ashtrays from your home, work, and car. 5. On a card, write down your reasons for quitting. Carry the card with you and read it when you get the urge to smoke. 6. Cleanse your body of nicotine. Drink enough water and fluids to keep your urine clear or pale yellow. Do this after quitting to flush the nicotine from your body. 7. Learn to predict your moods. Do not let a bad situation be your excuse to have a cigarette. Some situations in your life might tempt you into wanting a cigarette. 8. Never have "just one" cigarette. It leads to wanting another and another. Remind yourself of your decision to quit. 9. Change habits associated with smoking. If you smoked while driving or when feeling stressed, try other activities to replace smoking. Stand up when drinking your coffee. Brush your teeth after eating. Sit in a different chair when you read the paper. Avoid alcohol while trying to quit, and try to drink fewer caffeinated beverages. Alcohol and caffeine may urge you  to smoke. 10. Avoid foods and drinks that can trigger a desire to smoke, such as sugary or spicy foods and alcohol. 11. Ask people who smoke not to smoke around you. 12. Have something planned to do right after eating or having a cup of coffee. For example, plan to take a walk or exercise. 13. Try a relaxation exercise to calm you down and decrease your stress. Remember, you may be tense and nervous for the first 2 weeks after you quit, but this will pass. 14. Find new activities to keep your hands busy. Play with a pen, coin, or rubber band. Doodle or draw things on paper. 15. Brush your teeth right after eating. This will help cut down on the craving for the taste of tobacco after meals. You can also try mouthwash.  16. Use oral substitutes in place of cigarettes. Try using lemon drops, carrots, cinnamon sticks, or chewing gum. Keep them handy so they are available when you have the urge to smoke. 17. When you have the urge to smoke, try deep breathing. 18. Designate your home as a nonsmoking area. 19. If you are a heavy smoker, ask your health care provider about a prescription for nicotine chewing gum. It can ease your withdrawal from nicotine. 20. Reward yourself. Set aside the cigarette money you save and buy yourself something nice. 21. Look for support from others. Join a support group or smoking cessation program. Ask someone at home or at work to help you with your plan   to quit smoking. 22. Always ask yourself, "Do I need this cigarette or is this just a reflex?" Tell yourself, "Today, I choose not to smoke," or "I do not want to smoke." You are reminding yourself of your decision to quit. 23. Do not replace cigarette smoking with electronic cigarettes (commonly called e-cigarettes). The safety of e-cigarettes is unknown, and some may contain harmful chemicals. 24. If you relapse, do not give up! Plan ahead and think about what you will do the next time you get the urge to smoke. HOW WILL  I FEEL WHEN I QUIT SMOKING? You may have symptoms of withdrawal because your body is used to nicotine (the addictive substance in cigarettes). You may crave cigarettes, be irritable, feel very hungry, cough often, get headaches, or have difficulty concentrating. The withdrawal symptoms are only temporary. They are strongest when you first quit but will go away within 10-14 days. When withdrawal symptoms occur, stay in control. Think about your reasons for quitting. Remind yourself that these are signs that your body is healing and getting used to being without cigarettes. Remember that withdrawal symptoms are easier to treat than the major diseases that smoking can cause.  Even after the withdrawal is over, expect periodic urges to smoke. However, these cravings are generally short lived and will go away whether you smoke or not. Do not smoke! WHAT RESOURCES ARE AVAILABLE TO HELP ME QUIT SMOKING? Your health care provider can direct you to community resources or hospitals for support, which may include:  Group support.  Education.  Hypnosis.  Therapy.   This information is not intended to replace advice given to you by your health care provider. Make sure you discuss any questions you have with your health care provider.   Document Released: 12/12/2003 Document Revised: 04/05/2014 Document Reviewed: 08/31/2012 Elsevier Interactive Patient Education 2016 [place]  What are the risk, side effects and possible complications? Generally speaking, most procedures are safe.  However, with any procedure there are risks, side effects, and the possibility of complications.  The risks and complications are dependent upon the sites that are lesioned, or the type of nerve block to be performed.  The closer the procedure is to the spine, the more serious the risks are.  Great care is taken when placing the radio frequency needles, block needles or lesioning probes, but  sometimes complications can occur. 1. Infection: Any time there is an injection through the skin, there is a risk of infection.  This is why sterile conditions are used for these blocks.  There are four possible types of infection. 1. Localized skin infection. 2. Central Nervous System Infection-This can be in the form of Meningitis, which can be deadly. 3. Epidural Infections-This can be in the form of an epidural abscess, which can cause pressure inside of the spine, causing compression of the spinal cord with subsequent paralysis. This would require an emergency surgery to decompress, and there are no guarantees that the patient would recover from the paralysis. 4. Discitis-This is an infection of the intervertebral discs.  It occurs in about 1% of discography procedures.  It is difficult to treat and it may lead to surgery.        2. Pain: the needles have to go through skin and soft tissues, will cause soreness.       3. Damage to internal structures:  The nerves to be lesioned may be near blood vessels or    other nerves which can  be potentially damaged.       4. Bleeding: Bleeding is more common if the patient is taking blood thinners such as  aspirin, Coumadin, Ticiid, Plavix, etc., or if he/she have some genetic predisposition  such as hemophilia. Bleeding into the spinal canal can cause compression of the spinal  cord with subsequent paralysis.  This would require an emergency surgery to  decompress and there are no guarantees that the patient would recover from the  paralysis.       5. Pneumothorax:  Puncturing of a lung is a possibility, every time a needle is introduced in  the area of the chest or upper back.  Pneumothorax refers to free air around the  collapsed lung(s), inside of the thoracic cavity (chest cavity).  Another two possible  complications related to a similar event would include: Hemothorax and Chylothorax.   These are variations of the Pneumothorax, where instead of air  around the collapsed  lung(s), you may have blood or chyle, respectively.       6. Spinal headaches: They may occur with any procedures in the area of the spine.       7. Persistent CSF (Cerebro-Spinal Fluid) leakage: This is a rare problem, but may occur  with prolonged intrathecal or epidural catheters either due to the formation of a fistulous  track or a dural tear.       8. Nerve damage: By working so close to the spinal cord, there is always a possibility of  nerve damage, which could be as serious as a permanent spinal cord injury with  paralysis.       9. Death:  Although rare, severe deadly allergic reactions known as "Anaphylactic  reaction" can occur to any of the medications used.      10. Worsening of the symptoms:  We can always make thing worse.  What are the chances of something like this happening? Chances of any of this occuring are extremely low.  By statistics, you have more of a chance of getting killed in a motor vehicle accident: while driving to the hospital than any of the above occurring .  Nevertheless, you should be aware that they are possibilities.  In general, it is similar to taking a shower.  Everybody knows that you can slip, hit your head and get killed.  Does that mean that you should not shower again?  Nevertheless always keep in mind that statistics do not mean anything if you happen to be on the wrong side of them.  Even if a procedure has a 1 (one) in a 1,000,000 (million) chance of going wrong, it you happen to be that one..Also, keep in mind that by statistics, you have more of a chance of having something go wrong when taking medications.  Who should not have this procedure? If you are on a blood thinning medication (e.g. Coumadin, Plavix, see list of "Blood Thinners"), or if you have an active infection going on, you should not have the procedure.  If you are taking any blood thinners, please inform your physician.  How should I prepare for this  procedure?  Do not eat or drink anything at least six hours prior to the procedure.  Bring a driver with you .  It cannot be a taxi.  Come accompanied by an adult that can drive you back, and that is strong enough to help you if your legs get weak or numb from the local anesthetic.  Take all of your medicines  the morning of the procedure with just enough water to swallow them.  If you have diabetes, make sure that you are scheduled to have your procedure done first thing in the morning, whenever possible.  If you have diabetes, take only half of your insulin dose and notify our nurse that you have done so as soon as you arrive at the clinic.  If you are diabetic, but only take blood sugar pills (oral hypoglycemic), then do not take them on the morning of your procedure.  You may take them after you have had the procedure.  Do not take aspirin or any aspirin-containing medications, at least eleven (11) days prior to the procedure.  They may prolong bleeding.  Wear loose fitting clothing that may be easy to take off and that you would not mind if it got stained with Betadine or blood.  Do not wear any jewelry or perfume  Remove any nail coloring.  It will interfere with some of our monitoring equipment.  NOTE: Remember that this is not meant to be interpreted as a complete list of all possible complications.  Unforeseen problems may occur.  BLOOD THINNERS The following drugs contain aspirin or other products, which can cause increased bleeding during surgery and should not be taken for 2 weeks prior to and 1 week after surgery.  If you should need take something for relief of minor pain, you may take acetaminophen which is found in Tylenol,m Datril, Anacin-3 and Panadol. It is not blood thinner. The products listed below are.  Do not take any of the products listed below in addition to any listed on your instruction sheet.  A.P.C or A.P.C with Codeine Codeine Phosphate Capsules #3  Ibuprofen Ridaura  ABC compound Congesprin Imuran rimadil  Advil Cope Indocin Robaxisal  Alka-Seltzer Effervescent Pain Reliever and Antacid Coricidin or Coricidin-D  Indomethacin Rufen  Alka-Seltzer plus Cold Medicine Cosprin Ketoprofen S-A-C Tablets  Anacin Analgesic Tablets or Capsules Coumadin Korlgesic Salflex  Anacin Extra Strength Analgesic tablets or capsules CP-2 Tablets Lanoril Salicylate  Anaprox Cuprimine Capsules Levenox Salocol  Anexsia-D Dalteparin Magan Salsalate  Anodynos Darvon compound Magnesium Salicylate Sine-off  Ansaid Dasin Capsules Magsal Sodium Salicylate  Anturane Depen Capsules Marnal Soma  APF Arthritis pain formula Dewitt's Pills Measurin Stanback  Argesic Dia-Gesic Meclofenamic Sulfinpyrazone  Arthritis Bayer Timed Release Aspirin Diclofenac Meclomen Sulindac  Arthritis pain formula Anacin Dicumarol Medipren Supac  Analgesic (Safety coated) Arthralgen Diffunasal Mefanamic Suprofen  Arthritis Strength Bufferin Dihydrocodeine Mepro Compound Suprol  Arthropan liquid Dopirydamole Methcarbomol with Aspirin Synalgos  ASA tablets/Enseals Disalcid Micrainin Tagament  Ascriptin Doan's Midol Talwin  Ascriptin A/D Dolene Mobidin Tanderil  Ascriptin Extra Strength Dolobid Moblgesic Ticlid  Ascriptin with Codeine Doloprin or Doloprin with Codeine Momentum Tolectin  Asperbuf Duoprin Mono-gesic Trendar  Aspergum Duradyne Motrin or Motrin IB Triminicin  Aspirin plain, buffered or enteric coated Durasal Myochrisine Trigesic  Aspirin Suppositories Easprin Nalfon Trillsate  Aspirin with Codeine Ecotrin Regular or Extra Strength Naprosyn Uracel  Atromid-S Efficin Naproxen Ursinus  Auranofin Capsules Elmiron Neocylate Vanquish  Axotal Emagrin Norgesic Verin  Azathioprine Empirin or Empirin with Codeine Normiflo Vitamin E  Azolid Emprazil Nuprin Voltaren  Bayer Aspirin plain, buffered or children's or timed BC Tablets or powders Encaprin Orgaran Warfarin Sodium   Buff-a-Comp Enoxaparin Orudis Zorpin  Buff-a-Comp with Codeine Equegesic Os-Cal-Gesic   Buffaprin Excedrin plain, buffered or Extra Strength Oxalid   Bufferin Arthritis Strength Feldene Oxphenbutazone   Bufferin plain or Extra Strength Feldene Capsules Oxycodone with Aspirin  Bufferin with Codeine Fenoprofen Fenoprofen Pabalate or Pabalate-SF   Buffets II Flogesic Panagesic   Buffinol plain or Extra Strength Florinal or Florinal with Codeine Panwarfarin   Buf-Tabs Flurbiprofen Penicillamine   Butalbital Compound Four-way cold tablets Penicillin   Butazolidin Fragmin Pepto-Bismol   Carbenicillin Geminisyn Percodan   Carna Arthritis Reliever Geopen Persantine   Carprofen Gold's salt Persistin   Chloramphenicol Goody's Phenylbutazone   Chloromycetin Haltrain Piroxlcam   Clmetidine heparin Plaquenil   Cllnoril Hyco-pap Ponstel   Clofibrate Hydroxy chloroquine Propoxyphen         Before stopping any of these medications, be sure to consult the physician who ordered them.  Some, such as Coumadin (Warfarin) are ordered to prevent or treat serious conditions such as "deep thrombosis", "pumonary embolisms", and other heart problems.  The amount of time that you may need off of the medication may also vary with the medication and the reason for which you were taking it.  If you are taking any of these medications, please make sure you notify your pain physician before you undergo any procedures.         Facet Blocks Patient Information  Description: The facets are joints in the spine between the vertebrae.  Like any joints in the body, facets can become irritated and painful.  Arthritis can also effect the facets.  By injecting steroids and local anesthetic in and around these joints, we can temporarily block the nerve supply to them.  Steroids act directly on irritated nerves and tissues to reduce selling and inflammation which often leads to decreased pain.  Facet blocks may be done  anywhere along the spine from the neck to the low back depending upon the location of your pain.   After numbing the skin with local anesthetic (like Novocaine), a small needle is passed onto the facet joints under x-ray guidance.  You may experience a sensation of pressure while this is being done.  The entire block usually lasts about 15-25 minutes.   Conditions which may be treated by facet blocks:   Low back/buttock pain  Neck/shoulder pain  Certain types of headaches  Preparation for the injection:  1. Do not eat any solid food or dairy products within 8 hours of your appointment. 2. You may drink clear liquid up to 3 hours before appointment.  Clear liquids include water, black coffee, juice or soda.  No milk or cream please. 3. You may take your regular medication, including pain medications, with a sip of water before your appointment.  Diabetics should hold regular insulin (if taken separately) and take 1/2 normal NPH dose the morning of the procedure.  Carry some sugar containing items with you to your appointment. 4. A driver must accompany you and be prepared to drive you home after your procedure. 5. Bring all your current medications with you. 6. An IV may be inserted and sedation may be given at the discretion of the physician. 7. A blood pressure cuff, EKG and other monitors will often be applied during the procedure.  Some patients may need to have extra oxygen administered for a short period. 8. You will be asked to provide medical information, including your allergies and medications, prior to the procedure.  We must know immediately if you are taking blood thinners (like Coumadin/Warfarin) or if you are allergic to IV iodine contrast (dye).  We must know if you could possible be pregnant.  Possible side-effects:   Bleeding from needle site  Infection (rare, may require surgery)  Nerve injury (rare)  Numbness & tingling (temporary)  Difficulty urinating (rare,  temporary)  Spinal headache (a headache worse with upright posture)  Light-headedness (temporary)  Pain at injection site (serveral days)  Decreased blood pressure (rare, temporary)  Weakness in arm/leg (temporary)  Pressure sensation in back/neck (temporary)   Call if you experience:   Fever/chills associated with headache or increased back/neck pain  Headache worsened by an upright position  New onset, weakness or numbness of an extremity below the injection site  Hives or difficulty breathing (go to the emergency room)  Inflammation or drainage at the injection site(s)  Severe back/neck pain greater than usual  New symptoms which are concerning to you  Please note:  Although the local anesthetic injected can often make your back or neck feel good for several hours after the injection, the pain will likely return. It takes 3-7 days for steroids to work.  You may not notice any pain relief for at least one week.  If effective, we will often do a series of 2-3 injections spaced 3-6 weeks apart to maximally decrease your pain.  After the initial series, you may be a candidate for a more permanent nerve block of the facets.  If you have any questions, please call #[phone]) [facility]

## 2015-07-09 LAB — TOXASSURE SELECT 13 (MW), URINE: PDF: 0

## 2015-07-28 ENCOUNTER — Other Ambulatory Visit: Payer: Self-pay | Admitting: Internal Medicine

## 2015-07-28 DIAGNOSIS — Z1231 Encounter for screening mammogram for malignant neoplasm of breast: Secondary | ICD-10-CM

## 2015-08-14 ENCOUNTER — Other Ambulatory Visit: Payer: Self-pay | Admitting: Internal Medicine

## 2015-08-14 ENCOUNTER — Encounter: Payer: Self-pay | Admitting: *Deleted

## 2015-08-14 ENCOUNTER — Encounter: Payer: Medicare Other | Attending: Internal Medicine | Admitting: *Deleted

## 2015-08-14 ENCOUNTER — Ambulatory Visit
Admission: RE | Admit: 2015-08-14 | Discharge: 2015-08-14 | Disposition: A | Discharge status: Home / Self Care | Payer: Medicare Other | Source: Ambulatory Visit | Attending: Internal Medicine | Admitting: Internal Medicine

## 2015-08-14 VITALS — BP 92/60 | Ht 69.0 in | Wt 163.2 lb

## 2015-08-14 DIAGNOSIS — Z1231 Encounter for screening mammogram for malignant neoplasm of breast: Secondary | ICD-10-CM | POA: Diagnosis not present

## 2015-08-14 DIAGNOSIS — E119 Type 2 diabetes mellitus without complications: Secondary | ICD-10-CM

## 2015-08-14 NOTE — Patient Instructions (Addendum)
Check blood sugars 2 x day before breakfast and 2 hrs after supper  3-4 x week Exercise: Walk the dog for   20  minutes   7 days a week as tolerated Eat 3 meals day,  2-3  snacks a day Space meals 4-6 hours apart Allow 2-3 hours between eating meals and snacks Bring blood sugar records to the next class Call your doctor for a prescription for:  1. Meter strips (type) One Touch Ultra Blue    checking  3-4  times per week  2. Lancets (type) One Touch Delica             checking  3-4   times per week

## 2015-08-15 NOTE — Progress Notes (Signed)
Diabetes Self-Management Education  Visit Type: First/Initial  Appt. Start Time: 1530 Appt. End Time: 9562  08/15/2015  [name], identified by name and date of birth, is a 55 y.o. female with a diagnosis of Diabetes: Type 2.   ASSESSMENT  Blood pressure 92/60, height '5\' 9"'$  (1.753 m), weight 163 lb 3.2 oz (74.027 kg). Body mass index is 24.09 kg/(m^2).      Diabetes Self-Management Education - 08/14/15 1706    Visit Information   Visit Type First/Initial   Initial Visit   Diabetes Type Type 2   Are you currently following a meal plan? No   Are you taking your medications as prescribed? Yes   Date Diagnosed April 2017   Health Coping   How would you rate your overall health? Fair   Psychosocial Assessment   Patient Belief/Attitude about Diabetes Other (comment)  "not sure"   Self-care barriers None   Self-management support Doctor's office;Family   Patient Concerns Nutrition/Meal planning;Medication;Monitoring;Healthy Lifestyle;Problem Solving;Glycemic Control;Weight Control   Special Needs None   Preferred Learning Style Auditory;Visual;Hands on   Learning Readiness Ready   How often do you need to have someone help you when you read instructions, pamphlets, or other written materials from your doctor or pharmacy? 1 - Never   Pre-Education Assessment   Patient understands the diabetes disease and treatment process. Needs Instruction   Patient understands incorporating nutritional management into lifestyle. Needs Instruction   Patient undertands incorporating physical activity into lifestyle. Needs Instruction   Patient understands using medications safely. Needs Instruction   Patient understands monitoring blood glucose, interpreting and using results Needs Instruction   Patient understands prevention, detection, and treatment of acute complications. Needs Instruction   Patient understands prevention, detection, and treatment of chronic complications. Needs  Instruction   Patient understands how to develop strategies to address psychosocial issues. Needs Instruction   Patient understands how to develop strategies to promote health/change behavior. Needs Instruction   Complications   Last HgB A1C per patient/outside source 6.6 %  07/25/15   How often do you check your blood sugar? 0 times/day (not testing)  Provided One Touch Ultra 2 meter and instructed on use. BG upon return demonstration was 103 mg/dL at 4:40 pm -  1 1/2 hrs pp.    Have you had a dilated eye exam in the past 12 months? Yes   Have you had a dental exam in the past 12 months? Yes   Are you checking your feet? Yes   How many days per week are you checking your feet? 7   Dietary Intake   Breakfast fruit, egg, toast, sasusage 2 x week   Snack (morning) pt reports 4-5 snacks/day - sandwich, fruit   Lunch pre-packaged meal for microwave, ham and cheese sandwich   Dinner crab cake, pork chop, steak; potatoes, onions, fried zucchini, greens beans   Beverage(s) water, Coke Zero   Exercise   Exercise Type ADL's   Patient Education   Previous Diabetes Education No   Disease state  Definition of diabetes, type 1 and 2, and the diagnosis of diabetes;Factors that contribute to the development of diabetes   Nutrition management  Role of diet in the treatment of diabetes and the relationship between the three main macronutrients and blood glucose level   Physical activity and exercise  Role of exercise on diabetes management, blood pressure control and cardiac health.   Medications Reviewed patients medication for diabetes, action, purpose, timing of dose and side effects.  Monitoring Taught/evaluated SMBG meter.;Purpose and frequency of SMBG.;Identified appropriate SMBG and/or A1C goals.   Chronic complications Relationship between chronic complications and blood glucose control   Psychosocial adjustment Role of stress on diabetes;Identified and addressed patients feelings and concerns  about diabetes   Personal strategies to promote health Review risk of smoking and offered smoking cessation   Individualized Goals (developed by patient)   Reducing Risk Improve blood sugars Decrease medications Prevent diabetes complications Lose weight Lead a healthier lifestyle Become more fit   Outcomes   Expected Outcomes Demonstrated interest in learning. Expect positive outcomes      Individualized Plan for Diabetes Self-Management Training:   Learning Objective:  Patient will have a greater understanding of diabetes self-management. Patient education plan is to attend individual and/or group sessions per assessed needs and concerns.   Plan:   Patient Instructions  Check blood sugars 2 x day before breakfast and 2 hrs after supper  3-4 x week Exercise: Walk the dog for   20  minutes   7 days a week as tolerated Eat 3 meals day,  2-3  snacks a day Space meals 4-6 hours apart Allow 2-3 hours between eating meals and snacks Bring blood sugar records to the next class Call your doctor for a prescription for:  1. Meter strips (type) One Touch Ultra Blue    checking  3-4  times per week  2. Lancets (type) One Touch Delica             checking  3-4   times per week   Expected Outcomes:  Demonstrated interest in learning. Expect positive outcomes  Education material provided: General Meal Planning Guidelines Simple Meal Plan Meter - One Touch Ultra 2  If problems or questions, patient to contact team via:   Audrey Garrett, [place], [place], CDE [phone]  Future DSME appointment:  Thursday Aug 21, 2015 for Diabetes Class 1

## 2015-08-21 ENCOUNTER — Encounter: Payer: Medicare Other | Admitting: Dietician

## 2015-08-21 ENCOUNTER — Encounter: Payer: Self-pay | Admitting: Dietician

## 2015-08-21 VITALS — Wt 161.9 lb

## 2015-08-21 DIAGNOSIS — E119 Type 2 diabetes mellitus without complications: Secondary | ICD-10-CM | POA: Diagnosis not present

## 2015-08-21 NOTE — Progress Notes (Signed)
Appt. Start Time: 9:00am Appt. End Time: 12:00  Class 1 Diabetes Overview - define DM; state own type of DM; identify functions of pancreas and insulin; define insulin deficiency vs insulin resistance  Psychosocial - identify DM as a source of stress; state the effects of stress on BG control; verbalize appropriate stress management techniques; identify personal stress issues   Nutritional Management - describe effects of food on blood glucose; identify sources of carbohydrate, protein and fat; verbalize the importance of balance meals in controlling blood glucose; identify meals as well balanced or not; estimate servings of carbohydrate from menus; use food labels to identify servings size, content of carbohydrate, fiber, protein, fat, saturated fat and sodium; recognize food sources of fat, saturated fat, trans fat, sodium and verbalize goals for intake; describe healthful appropriate food choices when dining out   Exercise - describe the effects of exercise on blood glucose and importance of regular exercise in controlling diabetes; state a plan for personal exercise; verbalize contraindications for exercise  Self-Monitoring - state importance of HBGM and demo procedure accurately; use HBGM results to effectively manage diabetes; identify importance of regular HbA1C testing and goals for results. Reviewed HBGM procedure with patient. She had not been able to do it and said it kept reading "error". Asked her to demonstrate. She was taking out the strip to apply blood. Re-instructed and she was able to do procedure correctly. Her glucose was 230 and she stated she had eaten 10-12 M &M's and a breakfast bar at class break about an hour earlier. Gave her container of 10 strips and asked her to bring glucose record to next week's class.  Acute Complications/Sick Day Guidelines - recognize hyperglycemia and hypoglycemia with causes and effects; identify blood glucose results as high, low or in control;  list steps in treating and preventing high and low blood glucose; state appropriate measure to manage blood glucose when ill (need for meds, HBGM plan, when to call physician, need for fluids)  Chronic Complications/Foot, Skin, Eye Dental Care - identify possible long-term complications of diabetes (retinopathy, neuropathy, nephropathy, cardiovascular disease, infections); explain steps in prevention and treatment of chronic complications; state importance of daily self-foot exams; describe how to examine feet and what to look for; explain appropriate eye and dental care  Lifestyle Changes/Goals & Health/Community Resources - state benefits of making appropriate lifestyle changes; identify habits that need to change (meals, tobacco, alcohol); identify strategies to reduce risk factors for personal health; set goals for proper diabetes care; state need for and frequency of healthcare follow-up; describe appropriate community resources for good health (ADA, web sites, apps)   Pregnancy/Sexual Health - define gestational diabetes; state importance of good blood glucose control and birth control prior to pregnancy; state importance of good blood glucose control in preventing sexual problems (impotence, vaginal dryness, infections, loss of desire); state relationship of blood glucose control and pregnancy outcome; describe risk of maternal and fetal complications  Teaching Materials Used: Class 1 Slides/Notebook Diabetes Booklet ID Card  Medic Alert/Medic ID Forms Sleep Evaluation Exercise Handout Daily Food Record Planning a Balanced Meal Goals for Class 1

## 2015-08-28 ENCOUNTER — Encounter: Payer: Self-pay | Admitting: *Deleted

## 2015-08-28 ENCOUNTER — Encounter: Payer: Medicare Other | Attending: Internal Medicine | Admitting: *Deleted

## 2015-08-28 VITALS — Wt 163.6 lb

## 2015-08-28 DIAGNOSIS — E119 Type 2 diabetes mellitus without complications: Secondary | ICD-10-CM | POA: Diagnosis present

## 2015-08-28 NOTE — Progress Notes (Signed)

## 2015-09-01 ENCOUNTER — Telehealth: Payer: Self-pay | Admitting: Pain Medicine

## 2015-09-02 NOTE — Telephone Encounter (Signed)
error 

## 2015-09-04 ENCOUNTER — Encounter: Payer: Medicare Other | Admitting: Dietician

## 2015-09-04 ENCOUNTER — Encounter: Payer: Self-pay | Admitting: Dietician

## 2015-09-04 ENCOUNTER — Emergency Department: Payer: Medicare Other

## 2015-09-04 ENCOUNTER — Encounter: Payer: Self-pay | Admitting: Emergency Medicine

## 2015-09-04 ENCOUNTER — Emergency Department
Admission: EM | Admit: 2015-09-04 | Discharge: 2015-09-04 | Disposition: A | Discharge status: Home / Self Care | Payer: Medicare Other | Attending: Emergency Medicine | Admitting: Emergency Medicine

## 2015-09-04 VITALS — BP 96/58 | Ht 69.0 in | Wt 168.2 lb

## 2015-09-04 DIAGNOSIS — F3340 Major depressive disorder, recurrent, in remission, unspecified: Secondary | ICD-10-CM | POA: Insufficient documentation

## 2015-09-04 DIAGNOSIS — M5136 Other intervertebral disc degeneration, lumbar region: Secondary | ICD-10-CM | POA: Insufficient documentation

## 2015-09-04 DIAGNOSIS — E119 Type 2 diabetes mellitus without complications: Secondary | ICD-10-CM

## 2015-09-04 DIAGNOSIS — G8929 Other chronic pain: Secondary | ICD-10-CM | POA: Diagnosis not present

## 2015-09-04 DIAGNOSIS — I10 Essential (primary) hypertension: Secondary | ICD-10-CM | POA: Insufficient documentation

## 2015-09-04 DIAGNOSIS — E1165 Type 2 diabetes mellitus with hyperglycemia: Secondary | ICD-10-CM | POA: Diagnosis not present

## 2015-09-04 DIAGNOSIS — Z79899 Other long term (current) drug therapy: Secondary | ICD-10-CM | POA: Diagnosis not present

## 2015-09-04 DIAGNOSIS — E1142 Type 2 diabetes mellitus with diabetic polyneuropathy: Secondary | ICD-10-CM | POA: Diagnosis not present

## 2015-09-04 DIAGNOSIS — Z7984 Long term (current) use of oral hypoglycemic drugs: Secondary | ICD-10-CM | POA: Diagnosis not present

## 2015-09-04 DIAGNOSIS — M545 Low back pain, unspecified: Secondary | ICD-10-CM

## 2015-09-04 DIAGNOSIS — F331 Major depressive disorder, recurrent, moderate: Secondary | ICD-10-CM | POA: Insufficient documentation

## 2015-09-04 DIAGNOSIS — M4726 Other spondylosis with radiculopathy, lumbar region: Secondary | ICD-10-CM | POA: Insufficient documentation

## 2015-09-04 MED ORDER — OXYCODONE HCL 5 MG PO TABS
10.0000 mg | ORAL_TABLET | Freq: Once | ORAL | Status: AC
  Administered 2015-09-04: 10 mg via ORAL
  Filled 2015-09-04: qty 2

## 2015-09-04 NOTE — ED Notes (Signed)
"  I take oxycodone that is filled by pain management and I have not had any meds today - you know that some months have more than 30 days and I have run out and I cannot make it till tomorrow to have my scripts filled so I came here today."

## 2015-09-04 NOTE — Discharge Instructions (Signed)
Follow-up with your pain clinic doctor for any continued problems with your back. You may also use ice or heat to your back. Continue your other regular medication.

## 2015-09-04 NOTE — ED Notes (Signed)
Pt states she had back surgery 4 years ago, states she was painting a chair on Sat, heard a "snap" and has been having pain since Sat with no pain relief. States her neck is hurting as well. Is a pt at pain clinic.

## 2015-09-04 NOTE — Progress Notes (Signed)

## 2015-09-04 NOTE — ED Provider Notes (Signed)
[facility] Emergency Department Provider Note  ____________________________________________  Time seen: Approximately 1:37 PM  I have reviewed the triage vital signs and the nursing notes.   HISTORY  Chief Complaint Back Pain and Neck Pain   HPI Audrey Garrett is a 55 y.o. female is here today with complaint of back pain. Patient states that she had back surgery 4 years ago and has had chronic lives with her back. Currently she is a patient at the pain clinic in [place]. Patient states that she was painting a chair on Saturday and heard a "snap" in her lower back. She states that she has continued her oxycodone without any relief of her pain. Patient states she has not had any medication since yesterday because "some months have 31 days and I'll only give enough for 30 days". She denies any incontinence of bowel or bladder. She is continue to ambulate without assistance. Patient states that the pain in her left SI joint area and radiates up into her neck. She also complains that it runs down both legs". Currently she rates her pain as a 9 out of 10. She denies any fall with this event on Saturday. There is been no nausea, vomiting, fever or chills.   Past Medical History  Diagnosis Date  . Depression   . Anxiety   . High risk medications (not anticoagulants) long-term use   . Vitamin D deficiency disease   . Lumbago   . Hypertension   . Chronic hepatitis C ([place])   . Alcohol abuse   . Bronchitis   . Arthropathy   . Back pain   . Shoulder pain   . Stress headaches   . Hyperglycemia   . SOB (shortness of breath)   . Muscle spasm   . Edema   . Cirrhosis ([place])   . Polyarthritis   . Fatigue   . Sinusitis   . RLS (restless legs syndrome)   . Alcoholic cirrhosis of liver without ascites ([name])   . Reflux   . Fibromyalgia   . Spine disorder   . Chronic LBP 12/25/2014    Overview:  Post extensive surgery   . DDD (degenerative disc disease), lumbar  01/01/2015  . Lumbar radicular pain 01/01/2015  . Neck pain 01/01/2015  . Left leg pain 01/01/2015  . Sacroiliac pain 01/01/2015  . Upper back pain 01/01/2015  . Urinary frequency   . Anemia   . Diabetes mellitus without complication [facility])     Patient Active Problem List   Diagnosis Date Noted  . Depression, major, recurrent, moderate ([place]) 05/27/2015  . Alcohol abuse 05/27/2015  . Abdominal pain, chronic, epigastric 05/27/2015  . Difficulty urinating 05/26/2015  . Nephrolithiasis 05/26/2015  . Alcoholic cirrhosis of liver with ascites ([place]) 05/26/2015  . Liver cirrhosis, alcoholic ([place]) 03/31/7251  . Hepatitis C 05/22/2015  . Hypotension 05/22/2015  . Anemia 05/22/2015  . Thrombocytopenia ([place]) 05/22/2015  . Coagulopathy ([place]) 05/22/2015  . Hyperglycemia 05/22/2015  . Polyneuropathy ([place]) 05/22/2015  . Urinary retention 05/22/2015  . Ascites 05/21/2015  . Hepatic cirrhosis ([place]) 05/19/2015  . Hypomagnesemia (low magnesium levels) 04/07/2015  . Chronic pain 04/03/2015  . At risk for osteopenia 04/03/2015  . Lumbar spondylosis 04/03/2015  . Chronic lumbar radicular pain (Left) 04/03/2015  . Chronic neck pain 04/03/2015  . Chronic lower extremity pain (Left) 04/03/2015  . Chronic sacroiliac joint pain 04/03/2015  . Chronic upper back pain 04/03/2015  . Long term current use of opiate analgesic 04/03/2015  .  Encounter for chronic pain management 04/03/2015  . Bilateral low back pain with left-sided sciatica 01/01/2015  . Chronic pain syndrome 01/01/2015  . Cervical radicular pain 01/01/2015  . Other long term (current) drug therapy 01/01/2015  . Uncomplicated opioid dependence ([place]) 01/01/2015  . Long term prescription opiate use 01/01/2015  . Lumbar facet arthropathy 01/01/2015  . 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 Lumbar facet syndrome (Bilateral) 01/01/2015  . Osteoarthritis of spine with radiculopathy, lumbar region 01/01/2015  . Pain of paraspinal muscle  01/01/2015  . Low back pain with radiation 01/01/2015  . Opiate use (120 MME/Day) 01/01/2015  . Encounter for therapeutic drug level monitoring 01/01/2015  . Insomnia, persistent 12/25/2014  . Major depression in remission ([place]) 12/25/2014  . BP (high blood pressure) 12/25/2014  . Encounter for general adult medical examination without abnormal findings 12/25/2014  . HCV (hepatitis C virus) 12/25/2014  . ACUTE SINUSITIS, UNSPECIFIED 11/01/2009  . BRONCHITIS, ACUTE 11/01/2009    Past Surgical History  Procedure Laterality Date  . Tubal ligation    . Tonsillectomy    . Back surgery  12/19/2011  . Esophagogastroduodenoscopy N/A 04/14/2015    Procedure: ESOPHAGOGASTRODUODENOSCOPY (EGD);  Surgeon: Lollie Sails, MD;  Location: [facility];  Service: Endoscopy;  Laterality: N/A;    Current Outpatient Rx  Name  Route  Sig  Dispense  Refill  . ciprofloxacin (CIPRO) 500 MG tablet      TAKE 1 TABLET (500 MG TOTAL) BY MOUTH AS DIRECTED (ONE TABLET ON MONDAY AND FRIDAY).      2   . citalopram (CELEXA) 20 MG tablet   Oral   Take 20 mg by mouth daily.         . furosemide (LASIX) 40 MG tablet   Oral   Take 40 mg by mouth 2 (two) times daily.          . IRON PO   Oral   Take 1 tablet by mouth daily.         Marland Kitchen lactulose (CHRONULAC) 10 GM/15ML solution   Oral   Take 45 mLs (30 g total) by mouth daily.   240 mL   0   . lisinopril (PRINIVIL,ZESTRIL) 20 MG tablet   Oral   Take 20 mg by mouth daily.         . Magnesium 200 MG TABS   Oral   Take 1 tablet by mouth daily.      3   . metFORMIN (GLUCOPHAGE) 500 MG tablet   Oral   Take 500 mg by mouth 2 (two) times daily with a meal.          . Multiple Vitamins-Minerals (MULTIVITAMIN WITH MINERALS) tablet   Oral   Take 1 tablet by mouth daily.         . Naloxone HCl (NARCAN) 4 MG/0.1ML LIQD   Nasal   Place 1 Bottle into the nose once. , then call 911, repeat if needed in other nostril with new  bottle. Patient not taking: Reported on 08/14/2015   2 each   0     Please provide the patient with clear instructions ...   . Oxycodone HCl 20 MG TABS   Oral   Take 1 tablet (20 mg total) by mouth every 6 (six) hours as needed.   120 tablet   0     Do not place this medication, or any other prescri ...   . pantoprazole (PROTONIX) 40 MG tablet   Oral  Take 40 mg by mouth daily.         Marland Kitchen spironolactone (ALDACTONE) 100 MG tablet      TAKE 1 TABLET (100 MG TOTAL) BY MOUTH ONCE DAILY.      3     Allergies Review of patient's allergies indicates no known allergies.  Family History  Problem Relation Age of Onset  . Stroke Mother   . Heart disease Mother   . [name] Father   . Kidney disease Neg Hx   . Bladder Cancer Neg Hx   . Breast cancer Sister 63    Social History Social History  Substance Use Topics  . Smoking status: Current Every Day Smoker -- 0.50 packs/day for 40 years    Types: Cigarettes  . Smokeless tobacco: Never Used  . Alcohol Use: No     Comment: stopped drinking 05/12/15    Review of Systems Constitutional: No fever/chills Eyes: No visual changes. Cardiovascular: Denies chest pain. Respiratory: Denies shortness of breath. Gastrointestinal:  No nausea, no vomiting.  No diarrhea.  No constipation. Genitourinary: Negative for dysuria. Musculoskeletal: Positive for low back pain, left-sided. Positive for chronic low back pain, chronic neck pain. Skin: Negative for rash. Neurological: Negative for headaches, no focal weakness or numbness. Positive radiation of pain up to her neck and then down bilateral legs.  10-point ROS otherwise negative.  ____________________________________________   PHYSICAL EXAM:  VITAL SIGNS: ED Triage Vitals  Enc Vitals Group     BP 09/04/15 1306 127/50 mmHg     Pulse Rate 09/04/15 1306 70     Resp 09/04/15 1306 18     Temp 09/04/15 1306 97.9 F (36.6 C)     Temp Source 09/04/15 1306 Oral     SpO2  09/04/15 1306 99 %     Weight 09/04/15 1306 161 lb (73.029 kg)     Height 09/04/15 1306 '5\' 9"'$  (1.753 m)     Head Cir --      Peak Flow --      Pain Score 09/04/15 1306 9     Pain Loc --      Pain Edu? --      Excl. in [place]? --     Constitutional: Alert and oriented. Well appearing and in no acute distress. Eyes: Conjunctivae are normal. PERRL. EOMI. Head: Atraumatic. Nose: No congestion/rhinnorhea. Neck: No stridor.   Cardiovascular: Normal rate, regular rhythm. Grossly normal heart sounds.  Good peripheral circulation. Respiratory: Normal respiratory effort.  No retractions. Lungs CTAB. Gastrointestinal: Soft and nontender. No distention.  No CVA tenderness. Bowel sounds are normal 4 quadrants. Musculoskeletal: Moves upper and lower extremities without any difficulty. There is a well-healed surgical scar lumbar region without any obvious deformity noted. There is marked tenderness on palpation of the left sacral area and paravertebral muscles. There is no active muscle spasm seen. Range of motion is restricted secondary to patient's discomfort. Patient was seen ambulatory walking to the x-ray department Neurologic:  Normal speech and language. No gross focal neurologic deficits are appreciated. No gait instability. Skin:  Skin is warm, dry and intact. No rash noted. Psychiatric: Mood and affect are normal. Speech and behavior are normal.  ____________________________________________   LABS (all labs ordered are listed, but only abnormal results are displayed)  Labs Reviewed - No data to display  RADIOLOGY  X-rays lumbar spine per radiologist shows no acute bony abnormalities. There is no hardware failure or complications. There is mild degenerative disc disease at L2-L3, L4-L5, and L5-S1. There  is lumbar fusion from T10-S1 with bilateral screws. ____________________________________________   PROCEDURES  Procedure(s) performed: None  Critical Care performed:  No  ____________________________________________   INITIAL IMPRESSION / ASSESSMENT AND PLAN / ED COURSE  Pertinent labs & imaging results that were available during my care of the patient were reviewed by me and considered in my medical decision making (see chart for details).  Patient was given oxycodone 10 mg while in the emergency room prior to her x-rays. She states that her prescription for her regular amount of medication will be filled tomorrow and she prefers to wait until her regular scheduled refill is required. She'll follow-up with her pain medicine doctor if any continued problems. I also encouraged her to use ice or heat to her back to decrease her pain. She is also informed that her x-rays did not show any complications or damage to her surgical hardware. ____________________________________________   FINAL CLINICAL IMPRESSION(S) / ED DIAGNOSES  Final diagnoses:  Acute low back pain  Chronic lumbar pain      NEW MEDICATIONS STARTED DURING THIS VISIT:  New Prescriptions   No medications on file     Note:  This document was prepared using Dragon voice recognition software and may include unintentional dictation errors.    Johnn Hai, PA-C 09/04/15 [place], MD 09/04/15 [phone]

## 2015-09-10 ENCOUNTER — Other Ambulatory Visit
Admission: RE | Admit: 2015-09-10 | Discharge: 2015-09-10 | Disposition: A | Discharge status: Home / Self Care | Payer: Medicare Other | Source: Ambulatory Visit | Attending: Gastroenterology | Admitting: Gastroenterology

## 2015-09-10 ENCOUNTER — Ambulatory Visit
Admission: RE | Admit: 2015-09-10 | Discharge: 2015-09-10 | Disposition: A | Discharge status: Home / Self Care | Payer: Medicare Other | Source: Ambulatory Visit | Attending: Gastroenterology | Admitting: Gastroenterology

## 2015-09-10 ENCOUNTER — Other Ambulatory Visit: Payer: Self-pay | Admitting: Gastroenterology

## 2015-09-10 DIAGNOSIS — B182 Chronic viral hepatitis C: Secondary | ICD-10-CM

## 2015-09-10 DIAGNOSIS — K7031 Alcoholic cirrhosis of liver with ascites: Secondary | ICD-10-CM | POA: Diagnosis present

## 2015-09-10 DIAGNOSIS — R188 Other ascites: Secondary | ICD-10-CM | POA: Diagnosis not present

## 2015-09-10 DIAGNOSIS — K7460 Unspecified cirrhosis of liver: Secondary | ICD-10-CM

## 2015-09-10 LAB — AMMONIA: Ammonia: <9 umol/L — ABNORMAL LOW (ref 9–35)

## 2015-09-15 ENCOUNTER — Encounter: Payer: Self-pay | Admitting: *Deleted

## 2015-09-15 ENCOUNTER — Other Ambulatory Visit
Admission: RE | Admit: 2015-09-15 | Discharge: 2015-09-15 | Disposition: A | Discharge status: Home / Self Care | Payer: Medicare Other | Source: Ambulatory Visit | Attending: Gastroenterology | Admitting: Gastroenterology

## 2015-09-15 DIAGNOSIS — K7030 Alcoholic cirrhosis of liver without ascites: Secondary | ICD-10-CM | POA: Insufficient documentation

## 2015-09-15 DIAGNOSIS — R1010 Upper abdominal pain, unspecified: Secondary | ICD-10-CM | POA: Diagnosis present

## 2015-09-15 DIAGNOSIS — K921 Melena: Secondary | ICD-10-CM | POA: Diagnosis present

## 2015-09-15 LAB — CBC
HCT: 23.1 % — ABNORMAL LOW (ref 35.0–47.0)
Hemoglobin: 8 g/dL — ABNORMAL LOW (ref 12.0–16.0)
MCH: 33.7 pg (ref 26.0–34.0)
MCHC: 34.5 g/dL (ref 32.0–36.0)
MCV: 97.6 fL (ref 80.0–100.0)
Platelets: 127 10*3/uL — ABNORMAL LOW (ref 150–440)
RBC: 2.36 MIL/uL — ABNORMAL LOW (ref 3.80–5.20)
RDW: 16.6 % — ABNORMAL HIGH (ref 11.5–14.5)
WBC: 6.3 10*3/uL (ref 3.6–11.0)

## 2015-09-15 LAB — PROTIME-INR
INR: 1.13
Prothrombin Time: 14.6 seconds (ref 11.4–15.0)

## 2015-09-16 ENCOUNTER — Ambulatory Visit: Payer: Medicare Other | Admitting: Certified Registered Nurse Anesthetist

## 2015-09-16 ENCOUNTER — Encounter: Payer: Self-pay | Admitting: *Deleted

## 2015-09-16 ENCOUNTER — Ambulatory Visit
Admission: RE | Admit: 2015-09-16 | Discharge: 2015-09-16 | Disposition: A | Discharge status: Home / Self Care | Payer: Medicare Other | Source: Ambulatory Visit | Attending: Gastroenterology | Admitting: Gastroenterology

## 2015-09-16 ENCOUNTER — Encounter
Admission: RE | Disposition: A | Discharge status: Home / Self Care | Payer: Self-pay | Source: Ambulatory Visit | Attending: Gastroenterology

## 2015-09-16 DIAGNOSIS — Z79899 Other long term (current) drug therapy: Secondary | ICD-10-CM | POA: Diagnosis not present

## 2015-09-16 DIAGNOSIS — Z7984 Long term (current) use of oral hypoglycemic drugs: Secondary | ICD-10-CM | POA: Insufficient documentation

## 2015-09-16 DIAGNOSIS — K921 Melena: Secondary | ICD-10-CM | POA: Insufficient documentation

## 2015-09-16 DIAGNOSIS — M130 Polyarthritis, unspecified: Secondary | ICD-10-CM | POA: Insufficient documentation

## 2015-09-16 DIAGNOSIS — E119 Type 2 diabetes mellitus without complications: Secondary | ICD-10-CM | POA: Diagnosis not present

## 2015-09-16 DIAGNOSIS — I10 Essential (primary) hypertension: Secondary | ICD-10-CM | POA: Insufficient documentation

## 2015-09-16 DIAGNOSIS — K319 Disease of stomach and duodenum, unspecified: Secondary | ICD-10-CM | POA: Insufficient documentation

## 2015-09-16 DIAGNOSIS — E559 Vitamin D deficiency, unspecified: Secondary | ICD-10-CM | POA: Diagnosis not present

## 2015-09-16 DIAGNOSIS — F419 Anxiety disorder, unspecified: Secondary | ICD-10-CM | POA: Diagnosis not present

## 2015-09-16 DIAGNOSIS — F329 Major depressive disorder, single episode, unspecified: Secondary | ICD-10-CM | POA: Diagnosis not present

## 2015-09-16 HISTORY — PX: ESOPHAGOGASTRODUODENOSCOPY (EGD) WITH PROPOFOL: SHX5813

## 2015-09-16 LAB — CBC
HCT: 23 % — ABNORMAL LOW (ref 35.0–47.0)
Hemoglobin: 8 g/dL — ABNORMAL LOW (ref 12.0–16.0)
MCH: 33.7 pg (ref 26.0–34.0)
MCHC: 34.7 g/dL (ref 32.0–36.0)
MCV: 97.2 fL (ref 80.0–100.0)
Platelets: 130 10*3/uL — ABNORMAL LOW (ref 150–440)
RBC: 2.37 MIL/uL — ABNORMAL LOW (ref 3.80–5.20)
RDW: 16.5 % — ABNORMAL HIGH (ref 11.5–14.5)
WBC: 5.8 10*3/uL (ref 3.6–11.0)

## 2015-09-16 LAB — GLUCOSE, CAPILLARY: Glucose-Capillary: 110 mg/dL — ABNORMAL HIGH (ref 65–99)

## 2015-09-16 SURGERY — ESOPHAGOGASTRODUODENOSCOPY (EGD) WITH PROPOFOL
Anesthesia: General

## 2015-09-16 MED ORDER — EPHEDRINE SULFATE 50 MG/ML IJ SOLN
INTRAMUSCULAR | Status: DC | PRN
  Administered 2015-09-16: 10 mg via INTRAVENOUS
  Administered 2015-09-16: 5 mg via INTRAVENOUS

## 2015-09-16 MED ORDER — PROPOFOL 10 MG/ML IV BOLUS
INTRAVENOUS | Status: DC | PRN
  Administered 2015-09-16 (×2): 30 mg via INTRAVENOUS

## 2015-09-16 MED ORDER — SODIUM CHLORIDE 0.9 % IV SOLN: INTRAVENOUS | Status: DC

## 2015-09-16 MED ORDER — SODIUM CHLORIDE 0.9 % IV SOLN
INTRAVENOUS | Status: DC
  Administered 2015-09-16: 15:00:00 via INTRAVENOUS

## 2015-09-16 MED ORDER — LIDOCAINE HCL (CARDIAC) 20 MG/ML IV SOLN
INTRAVENOUS | Status: DC | PRN
  Administered 2015-09-16: 50 mg via INTRAVENOUS

## 2015-09-16 MED ORDER — PHENYLEPHRINE HCL 10 MG/ML IJ SOLN
INTRAMUSCULAR | Status: DC | PRN
  Administered 2015-09-16 (×4): 100 ug via INTRAVENOUS
  Administered 2015-09-16: 50 ug via INTRAVENOUS
  Administered 2015-09-16: 100 ug via INTRAVENOUS

## 2015-09-16 MED ORDER — MIDAZOLAM HCL 2 MG/2ML IJ SOLN
INTRAMUSCULAR | Status: DC | PRN
  Administered 2015-09-16: 1 mg via INTRAVENOUS

## 2015-09-16 MED ORDER — PROPOFOL 500 MG/50ML IV EMUL
INTRAVENOUS | Status: DC | PRN
  Administered 2015-09-16: 140 ug/kg/min via INTRAVENOUS

## 2015-09-16 MED ORDER — SODIUM CHLORIDE 0.9 % IV SOLN
INTRAVENOUS | Status: DC
  Administered 2015-09-16: 14:00:00 via INTRAVENOUS

## 2015-09-16 NOTE — Anesthesia Preprocedure Evaluation (Signed)
Anesthesia Evaluation  Patient identified by MRN, date of birth, ID band Patient awake    Reviewed: Allergy & Precautions, NPO status , [name] Chart, lab work & pertinent test results  Airway Mallampati: II       Dental  (+) Upper Dentures, Lower Dentures   Pulmonary shortness of breath and with exertion, COPD, Current Smoker,     + decreased breath sounds      Cardiovascular Exercise Tolerance: Good hypertension, Pt. on medications  Rhythm:Regular     Neuro/Psych Anxiety Depression    GI/Hepatic negative GI ROS, (+) Hepatitis -, C  Endo/Other  diabetes, Type 2, Oral Hypoglycemic Agents  Renal/GU      Musculoskeletal  (+) Fibromyalgia -  Abdominal ascites  Peds  Hematology  (+) anemia ,   Anesthesia Other Findings   Reproductive/Obstetrics                             Anesthesia Physical Anesthesia Plan  ASA: III  Anesthesia Plan: General   Post-op Pain Management:    Induction: Intravenous  Airway Management Planned: Natural Airway and Nasal Cannula  Additional Equipment:   Intra-op Plan:   Post-operative Plan:   Informed Consent: I have reviewed the patients History and Physical, chart, labs and discussed the procedure including the risks, benefits and alternatives for the proposed anesthesia with the patient or authorized representative who has indicated his/her understanding and acceptance.     Plan Discussed with: CRNA  Anesthesia Plan Comments:         Anesthesia Quick Evaluation

## 2015-09-16 NOTE — Anesthesia Procedure Notes (Signed)
Date/Time: 09/16/2015 2:42 PM Performed by: Johnna Acosta Pre-anesthesia Checklist: Patient identified, Emergency Drugs available, Suction available, Patient being monitored and Timeout performed Patient Re-evaluated:Patient Re-evaluated prior to inductionOxygen Delivery Method: Nasal cannula Preoxygenation: Pre-oxygenation with 100% oxygen

## 2015-09-16 NOTE — Transfer of Care (Signed)
Immediate Anesthesia Transfer of Care Note  Patient: Audrey Garrett  Procedure(s) Performed: Procedure(s): ESOPHAGOGASTRODUODENOSCOPY (EGD) WITH PROPOFOL (N/A)  Patient Location: PACU  Anesthesia Type:General  Level of Consciousness: sedated  Airway & Oxygen Therapy: Patient Spontanous Breathing and Patient connected to nasal cannula oxygen  Post-op Assessment: Report given to RN and Post -op Vital signs reviewed and stable  Post vital signs: Reviewed and stable  Last Vitals:  Filed Vitals:   09/16/15 1412  BP: 97/62  Pulse: 73  Temp: 37.3 C  Resp: 18    Last Pain:  Filed Vitals:   09/16/15 1414  PainSc: 4       Patients Stated Pain Goal: 4 (16/10/96 0454)  Complications: No apparent anesthesia complications

## 2015-09-16 NOTE — Op Note (Addendum)
[facility] Gastroenterology Patient Name: Audrey Garrett Procedure Date: 09/16/2015 2:40 PM MRN: [id number] Account #: [id number] Date of Birth: [birth year] Admit Type: Outpatient Age: 55 Room: [facility] ENDO ROOM 3 Gender: Female Note Status: Finalized Procedure:            Upper GI endoscopy Indications:          Heme positive stool, Melena Providers:            Lollie Sails, MD Referring MD:         No Local Md, MD (Referring MD) Medicines:            Cirrhosis, Monitored Anesthesia Care Complications:        No immediate complications. Procedure:            Pre-Anesthesia Assessment:                       - ASA Grade Assessment: III - A patient with severe                        systemic disease.                       After obtaining informed consent, the endoscope was                        passed under direct vision. Throughout the procedure,                        the patient's blood pressure, pulse, and oxygen                        saturations were monitored continuously. The Endoscope                        was introduced through the mouth, and advanced to the                        fourth part of duodenum. The upper GI endoscopy was                        unusually difficult due to UES prominance. Successful                        completion of the procedure was aided by withdrawing                        the scope and replacing with the pediatric endoscope. Findings:      The examined esophagus was normal. No evidence of varices or esophagitis.      The cardia and gastric fundus were normal on retroflexion.      A single localized, 2 mm non-bleeding erosion was found in the       prepyloric region of the stomach. There were no stigmata of recent       bleeding.      No heme material noted throughout. Some of the gastric surface was       mildly obscured due to carafate medication, good view obtained with       lavage.      The examined duodenum was  normal. Impression:           -  Normal esophagus.                       - Non-bleeding erosive gastropathy.                       - Normal examined duodenum.                       - No specimens collected. Recommendation:       - Discharge patient to home.                       - Use Protonix (pantoprazole) 40 mg PO BID daily.                       - Use sucralfate tablets 1 gram PO QID daily.                       - Return to GI clinic in 1 week.                       - Perform an H. pylori stool antigen (HpSA) test at                        appointment to be scheduled.                       - Check hemoglobin in 5 days. Patient to repoirt any                        further black stool. Proceed with colonoscopy in 2                        weeks. Procedure Code(s):    --- Professional ---                       [phone], Esophagogastroduodenoscopy, flexible, transoral;                        diagnostic, including collection of specimen(s) by                        brushing or washing, when performed (separate procedure) Diagnosis Code(s):    --- Professional ---                       K31.89, Other diseases of stomach and duodenum                       R19.5, Other fecal abnormalities                       K92.1, Melena (includes Hematochezia) CPT copyright 2016 American Medical Association. All rights reserved. The codes documented in this report are preliminary and upon coder review may  be revised to meet current compliance requirements. Lollie Sails, MD 09/16/2015 3:42:10 PM This report has been signed electronically. Number of Addenda: 0 Note Initiated On: 09/16/2015 2:40 PM      [facility]

## 2015-09-16 NOTE — Anesthesia Postprocedure Evaluation (Signed)
Anesthesia Post Note  Patient: Karra Pink  Procedure(s) Performed: Procedure(s) (LRB): ESOPHAGOGASTRODUODENOSCOPY (EGD) WITH PROPOFOL (N/A)  Patient location during evaluation: Endoscopy Anesthesia Type: General Level of consciousness: awake and alert Pain management: pain level controlled Vital Signs Assessment: post-procedure vital signs reviewed and stable Respiratory status: spontaneous breathing, nonlabored ventilation, respiratory function stable and patient connected to nasal cannula oxygen Cardiovascular status: blood pressure returned to baseline and stable Postop Assessment: no signs of nausea or vomiting Anesthetic complications: no    Last Vitals:  Filed Vitals:   09/16/15 1550 09/16/15 1600  BP: 96/57 103/58  Pulse: 87 87  Temp:    Resp: 13 18    Last Pain:  Filed Vitals:   09/16/15 1601  PainSc: 4                  Martha Clan

## 2015-09-16 NOTE — H&P (Signed)
Outpatient short stay form Pre-procedure 09/16/2015 2:38 PM Lollie Sails MD  Primary Physician: Dr Elijio Miles  Reason for visit:  EGD  History of present illness:  Patient is a 55 year old female presenting today for EGD. She has a history of cirrhosis of the liver. she has noted some black stools recently. She was heme positive on testing. Labs checked today indicate a white cell count of 5.8 hemoglobin 8.0 MCV is 97.2 platelet count of 130. Her hemoglobin is stable from yesterday she has seen no black stools since several days ago. He does have some epigastric burning sensation over no nausea or vomiting. There is no lower abdominal discomfort. Patient has been taking a PPI twice daily. She is not taking a beta blocker.    Current facility-administered medications:  .  0.9 %  sodium chloride infusion, , Intravenous, Continuous, Lollie Sails, MD .  0.9 %  sodium chloride infusion, , Intravenous, Continuous, Lollie Sails, MD, Last Rate: 20 mL/hr at 09/16/15 1434 .  0.9 %  sodium chloride infusion, , Intravenous, Continuous, Lollie Sails, MD  Prescriptions prior to admission  Medication Sig Dispense Refill Last Dose  . ciprofloxacin (CIPRO) 500 MG tablet TAKE 1 TABLET (500 MG TOTAL) BY MOUTH AS DIRECTED (ONE TABLET ON MONDAY AND FRIDAY).  2 09/15/2015 at Unknown time  . citalopram (CELEXA) 20 MG tablet Take 20 mg by mouth daily.   09/16/2015 at 0600  . furosemide (LASIX) 40 MG tablet Take 40 mg by mouth 2 (two) times daily.    09/16/2015 at 0600  . IRON PO Take 1 tablet by mouth daily.   Past Week at Unknown time  . lactulose (CEPHULAC) 10 g packet Take 10 g by mouth 3 (three) times daily.   09/16/2015 at 0600  . lisinopril (PRINIVIL,ZESTRIL) 20 MG tablet Take 20 mg by mouth daily.   Past Week at Unknown time  . Magnesium 200 MG TABS Take 1 tablet by mouth daily.  3 09/16/2015 at 0600  . Oxycodone HCl 20 MG TABS Take 1 tablet (20 mg total) by mouth every 6 (six) hours as needed.  120 tablet 0 09/16/2015 at 1100  . pantoprazole (PROTONIX) 40 MG tablet Take 40 mg by mouth daily.   09/16/2015 at 0600  . spironolactone (ALDACTONE) 100 MG tablet TAKE 1 TABLET (100 MG TOTAL) BY MOUTH ONCE DAILY.  3 09/16/2015 at 0600  . metFORMIN (GLUCOPHAGE) 500 MG tablet Take 500 mg by mouth 2 (two) times daily with a meal.    Taking  . mirtazapine (REMERON) 15 MG tablet Take 15 mg by mouth at bedtime. Reported on 09/16/2015   Not Taking at Unknown time  . Multiple Vitamins-Minerals (MULTIVITAMIN WITH MINERALS) tablet Take 1 tablet by mouth daily.   Taking  . Naloxone HCl (NARCAN) 4 MG/0.1ML LIQD Place 1 Bottle into the nose once. , then call 911, repeat if needed in other nostril with new bottle. (Patient not taking: Reported on 08/14/2015) 2 each 0 Not Taking     No Known Allergies   Past Medical History  Diagnosis Date  . Depression   . Anxiety   . High risk medications (not anticoagulants) long-term use   . Vitamin D deficiency disease   . Lumbago   . Hypertension   . Chronic hepatitis C ([place])   . Alcohol abuse   . Bronchitis   . Arthropathy   . Back pain   . Shoulder pain   . Stress headaches   . Hyperglycemia   .  SOB (shortness of breath)   . Muscle spasm   . Edema   . Cirrhosis ([place])   . Polyarthritis   . Fatigue   . Sinusitis   . RLS (restless legs syndrome)   . Alcoholic cirrhosis of liver without ascites ([place])   . Reflux   . Fibromyalgia   . Spine disorder   . Chronic LBP 12/25/2014    Overview:  Post extensive surgery   . DDD (degenerative disc disease), lumbar 01/01/2015  . Lumbar radicular pain 01/01/2015  . Neck pain 01/01/2015  . Left leg pain 01/01/2015  . Sacroiliac pain 01/01/2015  . Upper back pain 01/01/2015  . Urinary frequency   . Anemia   . Diabetes mellitus without complication ([place])     Review of systems:      Physical Exam    Heart and lungs: Regular rate and rhythm without rub or gallop, lungs are bilaterally clear.    HEENT:  Normocephalic atraumatic eyes are anicteric    Other:     Pertinant exam for procedure: Soft nontender nondistended mild protuberance bowel sounds are positive normoactive. There is a umbilical hernia.    Planned proceedures: EGD with indicated procedures. I have discussed the risks benefits and complications of procedures to include not limited to bleeding, infection, perforation and the risk of sedation and the patient wishes to proceed.    Lollie Sails, MD Gastroenterology 09/16/2015  2:38 PM

## 2015-09-18 ENCOUNTER — Encounter: Payer: Self-pay | Admitting: Gastroenterology

## 2015-09-19 ENCOUNTER — Telehealth: Payer: Self-pay | Admitting: Pain Medicine

## 2015-09-19 NOTE — Telephone Encounter (Signed)
Called patient to see what was going on.  States she twisted and felt a pop and was having severe pain down her leg.  States she has since made an appointment with Dr Patrice Paradise in [place].  States she has an appointment with Dr Dossie Arbour on 10-01-15.  Instructed patient to call us back if she had any further questions or concerns.

## 2015-09-19 NOTE — Telephone Encounter (Signed)
Still having severe back pain, crying and upset, went to er, no results, I asked patient if she had called phys. That did surgery or Ortho phys. She said she thought that was what Dr. Dossie Arbour was, I informed her that he was not and suggested she try her Ortho or Surgeon for help.

## 2015-09-22 ENCOUNTER — Encounter: Payer: Self-pay | Admitting: Emergency Medicine

## 2015-09-22 ENCOUNTER — Inpatient Hospital Stay
Admission: EM | Admit: 2015-09-22 | Discharge: 2015-09-26 | DRG: 378 | Disposition: A | Discharge status: Home / Self Care | Payer: Medicare Other | Attending: Internal Medicine | Admitting: Internal Medicine

## 2015-09-22 DIAGNOSIS — I10 Essential (primary) hypertension: Secondary | ICD-10-CM | POA: Diagnosis present

## 2015-09-22 DIAGNOSIS — Z7984 Long term (current) use of oral hypoglycemic drugs: Secondary | ICD-10-CM | POA: Diagnosis not present

## 2015-09-22 DIAGNOSIS — F329 Major depressive disorder, single episode, unspecified: Secondary | ICD-10-CM | POA: Diagnosis present

## 2015-09-22 DIAGNOSIS — K921 Melena: Principal | ICD-10-CM

## 2015-09-22 DIAGNOSIS — G2581 Restless legs syndrome: Secondary | ICD-10-CM | POA: Diagnosis present

## 2015-09-22 DIAGNOSIS — F17210 Nicotine dependence, cigarettes, uncomplicated: Secondary | ICD-10-CM | POA: Diagnosis present

## 2015-09-22 DIAGNOSIS — Z8249 Family history of ischemic heart disease and other diseases of the circulatory system: Secondary | ICD-10-CM

## 2015-09-22 DIAGNOSIS — E8770 Fluid overload, unspecified: Secondary | ICD-10-CM | POA: Diagnosis present

## 2015-09-22 DIAGNOSIS — K7031 Alcoholic cirrhosis of liver with ascites: Secondary | ICD-10-CM

## 2015-09-22 DIAGNOSIS — D638 Anemia in other chronic diseases classified elsewhere: Secondary | ICD-10-CM | POA: Diagnosis present

## 2015-09-22 DIAGNOSIS — Z8052 Family history of malignant neoplasm of bladder: Secondary | ICD-10-CM | POA: Diagnosis not present

## 2015-09-22 DIAGNOSIS — K5730 Diverticulosis of large intestine without perforation or abscess without bleeding: Secondary | ICD-10-CM | POA: Diagnosis present

## 2015-09-22 DIAGNOSIS — Z79899 Other long term (current) drug therapy: Secondary | ICD-10-CM | POA: Diagnosis not present

## 2015-09-22 DIAGNOSIS — M545 Low back pain: Secondary | ICD-10-CM | POA: Diagnosis present

## 2015-09-22 DIAGNOSIS — E119 Type 2 diabetes mellitus without complications: Secondary | ICD-10-CM | POA: Diagnosis present

## 2015-09-22 DIAGNOSIS — E876 Hypokalemia: Secondary | ICD-10-CM | POA: Diagnosis not present

## 2015-09-22 DIAGNOSIS — B182 Chronic viral hepatitis C: Secondary | ICD-10-CM | POA: Diagnosis present

## 2015-09-22 DIAGNOSIS — Z803 Family history of malignant neoplasm of breast: Secondary | ICD-10-CM | POA: Diagnosis not present

## 2015-09-22 DIAGNOSIS — G894 Chronic pain syndrome: Secondary | ICD-10-CM | POA: Diagnosis present

## 2015-09-22 DIAGNOSIS — Z823 Family history of stroke: Secondary | ICD-10-CM | POA: Diagnosis not present

## 2015-09-22 DIAGNOSIS — M797 Fibromyalgia: Secondary | ICD-10-CM | POA: Diagnosis present

## 2015-09-22 DIAGNOSIS — D62 Acute posthemorrhagic anemia: Secondary | ICD-10-CM | POA: Diagnosis present

## 2015-09-22 DIAGNOSIS — F419 Anxiety disorder, unspecified: Secondary | ICD-10-CM | POA: Diagnosis present

## 2015-09-22 DIAGNOSIS — E871 Hypo-osmolality and hyponatremia: Secondary | ICD-10-CM | POA: Diagnosis present

## 2015-09-22 DIAGNOSIS — K922 Gastrointestinal hemorrhage, unspecified: Secondary | ICD-10-CM | POA: Diagnosis present

## 2015-09-22 DIAGNOSIS — Z8711 Personal history of peptic ulcer disease: Secondary | ICD-10-CM | POA: Diagnosis not present

## 2015-09-22 DIAGNOSIS — I959 Hypotension, unspecified: Secondary | ICD-10-CM | POA: Diagnosis present

## 2015-09-22 DIAGNOSIS — M549 Dorsalgia, unspecified: Secondary | ICD-10-CM

## 2015-09-22 DIAGNOSIS — K7460 Unspecified cirrhosis of liver: Secondary | ICD-10-CM

## 2015-09-22 DIAGNOSIS — M25552 Pain in left hip: Secondary | ICD-10-CM | POA: Diagnosis present

## 2015-09-22 DIAGNOSIS — D124 Benign neoplasm of descending colon: Secondary | ICD-10-CM | POA: Diagnosis present

## 2015-09-22 LAB — COMPREHENSIVE METABOLIC PANEL
ALT: 19 U/L (ref 14–54)
AST: 33 U/L (ref 15–41)
Albumin: 3 g/dL — ABNORMAL LOW (ref 3.5–5.0)
Alkaline Phosphatase: 76 U/L (ref 38–126)
Anion gap: 8 (ref 5–15)
BUN: 12 mg/dL (ref 6–20)
CO2: 22 mmol/L (ref 22–32)
Calcium: 8.2 mg/dL — ABNORMAL LOW (ref 8.9–10.3)
Chloride: 99 mmol/L — ABNORMAL LOW (ref 101–111)
Creatinine, Ser: 0.59 mg/dL (ref 0.44–1.00)
GFR calc Af Amer: >60 mL/min (ref >60)
GFR calc non Af Amer: >60 mL/min (ref >60)
Glucose, Bld: 179 mg/dL — ABNORMAL HIGH (ref 65–99)
Potassium: 3.5 mmol/L (ref 3.5–5.1)
Sodium: 129 mmol/L — ABNORMAL LOW (ref 135–145)
Total Bilirubin: 0.5 mg/dL (ref 0.3–1.2)
Total Protein: 6.4 g/dL — ABNORMAL LOW (ref 6.5–8.1)

## 2015-09-22 LAB — GLUCOSE, CAPILLARY
Glucose-Capillary: 163 mg/dL — ABNORMAL HIGH (ref 65–99)
Glucose-Capillary: 230 mg/dL — ABNORMAL HIGH (ref 65–99)

## 2015-09-22 LAB — CBC
HCT: 16.2 % — ABNORMAL LOW (ref 35.0–47.0)
Hemoglobin: 5.7 g/dL — ABNORMAL LOW (ref 12.0–16.0)
MCH: 35.2 pg — ABNORMAL HIGH (ref 26.0–34.0)
MCHC: 35.1 g/dL (ref 32.0–36.0)
MCV: 100.3 fL — ABNORMAL HIGH (ref 80.0–100.0)
Platelets: 151 10*3/uL (ref 150–440)
RBC: 1.61 MIL/uL — ABNORMAL LOW (ref 3.80–5.20)
RDW: 16.6 % — ABNORMAL HIGH (ref 11.5–14.5)
WBC: 6.8 10*3/uL (ref 3.6–11.0)

## 2015-09-22 LAB — IRON AND TIBC
Iron: 301 ug/dL — ABNORMAL HIGH (ref 28–170)
Saturation Ratios: 76 % — ABNORMAL HIGH (ref 10.4–31.8)
TIBC: 396 ug/dL (ref 250–450)
UIBC: 96 ug/dL

## 2015-09-22 LAB — FERRITIN: Ferritin: 40 ng/mL (ref 11–307)

## 2015-09-22 LAB — PREPARE RBC (CROSSMATCH)

## 2015-09-22 MED ORDER — ACETAMINOPHEN 650 MG RE SUPP: 650.0000 mg | Freq: Four times a day (QID) | RECTAL | Status: DC | PRN

## 2015-09-22 MED ORDER — PANTOPRAZOLE SODIUM 40 MG PO TBEC: 40.0000 mg | DELAYED_RELEASE_TABLET | Freq: Two times a day (BID) | ORAL | Status: DC

## 2015-09-22 MED ORDER — SODIUM CHLORIDE 0.9% FLUSH
3.0000 mL | Freq: Two times a day (BID) | INTRAVENOUS | Status: DC
  Administered 2015-09-22 – 2015-09-25 (×6): 3 mL via INTRAVENOUS

## 2015-09-22 MED ORDER — ONDANSETRON HCL 4 MG/2ML IJ SOLN: 4.0000 mg | Freq: Four times a day (QID) | INTRAMUSCULAR | Status: DC | PRN

## 2015-09-22 MED ORDER — SODIUM CHLORIDE 0.9 % IV SOLN
250.0000 mL | INTRAVENOUS | Status: DC | PRN
  Administered 2015-09-25: 15:00:00 via INTRAVENOUS

## 2015-09-22 MED ORDER — INSULIN ASPART 100 UNIT/ML ~~LOC~~ SOLN
0.0000 [IU] | SUBCUTANEOUS | Status: DC
  Administered 2015-09-22: 3 [IU] via SUBCUTANEOUS
  Administered 2015-09-23 (×2): 2 [IU] via SUBCUTANEOUS
  Administered 2015-09-23 – 2015-09-24 (×2): 1 [IU] via SUBCUTANEOUS
  Administered 2015-09-24: 2 [IU] via SUBCUTANEOUS
  Administered 2015-09-25 (×2): 1 [IU] via SUBCUTANEOUS
  Filled 2015-09-22 (×3): qty 1
  Filled 2015-09-22: qty 3
  Filled 2015-09-22: qty 1
  Filled 2015-09-22 (×2): qty 2

## 2015-09-22 MED ORDER — SODIUM CHLORIDE 0.9% FLUSH: 3.0000 mL | INTRAVENOUS | Status: DC | PRN

## 2015-09-22 MED ORDER — MAGNESIUM OXIDE 400 (241.3 MG) MG PO TABS
400.0000 mg | ORAL_TABLET | Freq: Every day | ORAL | Status: DC
  Administered 2015-09-23 – 2015-09-26 (×2): 400 mg via ORAL
  Filled 2015-09-22 (×2): qty 1

## 2015-09-22 MED ORDER — OXYCODONE HCL 10 MG PO TABS: 20.0000 mg | ORAL_TABLET | Freq: Four times a day (QID) | ORAL | Status: DC | PRN

## 2015-09-22 MED ORDER — CIPROFLOXACIN HCL 500 MG PO TABS: 500.0000 mg | ORAL_TABLET | ORAL | Status: DC

## 2015-09-22 MED ORDER — SODIUM CHLORIDE 0.9% FLUSH
3.0000 mL | Freq: Two times a day (BID) | INTRAVENOUS | Status: DC
Start: 2015-09-22 — End: 2015-09-25
  Administered 2015-09-22 – 2015-09-25 (×4): 3 mL via INTRAVENOUS

## 2015-09-22 MED ORDER — SUCRALFATE 1 GM/10ML PO SUSP
1.0000 g | Freq: Three times a day (TID) | ORAL | Status: DC
Start: 2015-09-22 — End: 2015-09-26
  Administered 2015-09-22 – 2015-09-26 (×10): 1 g via ORAL
  Filled 2015-09-22 (×12): qty 10

## 2015-09-22 MED ORDER — FUROSEMIDE 40 MG PO TABS
40.0000 mg | ORAL_TABLET | Freq: Two times a day (BID) | ORAL | Status: DC
  Administered 2015-09-24: 40 mg via ORAL
  Filled 2015-09-22: qty 1

## 2015-09-22 MED ORDER — LACTULOSE 10 GM/15ML PO SOLN
10.0000 g | Freq: Three times a day (TID) | ORAL | Status: DC
  Administered 2015-09-22 – 2015-09-26 (×8): 10 g via ORAL
  Filled 2015-09-22 (×8): qty 30

## 2015-09-22 MED ORDER — ALBUTEROL SULFATE (2.5 MG/3ML) 0.083% IN NEBU: 2.5000 mg | INHALATION_SOLUTION | RESPIRATORY_TRACT | Status: DC | PRN

## 2015-09-22 MED ORDER — SODIUM CHLORIDE 0.9 % IV SOLN
10.0000 mL/h | Freq: Once | INTRAVENOUS | Status: AC
  Administered 2015-09-22: 10 mL/h via INTRAVENOUS

## 2015-09-22 MED ORDER — ACETAMINOPHEN 325 MG PO TABS: 650.0000 mg | ORAL_TABLET | Freq: Four times a day (QID) | ORAL | Status: DC | PRN

## 2015-09-22 MED ORDER — SPIRONOLACTONE 25 MG PO TABS: 100.0000 mg | ORAL_TABLET | Freq: Every day | ORAL | Status: DC

## 2015-09-22 MED ORDER — ONDANSETRON HCL 4 MG PO TABS: 4.0000 mg | ORAL_TABLET | Freq: Four times a day (QID) | ORAL | Status: DC | PRN

## 2015-09-22 MED ORDER — OXYCODONE HCL 5 MG PO TABS
20.0000 mg | ORAL_TABLET | ORAL | Status: DC | PRN
  Administered 2015-09-22 – 2015-09-23 (×4): 20 mg via ORAL
  Filled 2015-09-22 (×4): qty 4

## 2015-09-22 MED ORDER — CITALOPRAM HYDROBROMIDE 20 MG PO TABS
20.0000 mg | ORAL_TABLET | Freq: Every day | ORAL | Status: DC
  Administered 2015-09-23 – 2015-09-26 (×2): 20 mg via ORAL
  Filled 2015-09-22 (×2): qty 1

## 2015-09-22 MED ORDER — GABAPENTIN 300 MG PO CAPS
300.0000 mg | ORAL_CAPSULE | Freq: Three times a day (TID) | ORAL | Status: DC
  Administered 2015-09-22 – 2015-09-26 (×8): 300 mg via ORAL
  Filled 2015-09-22 (×8): qty 1

## 2015-09-22 NOTE — ED Provider Notes (Signed)
Time Seen: Approximately 1540 I have reviewed the triage notes  Chief Complaint: GI Bleeding and Abnormal Lab   History of Present Illness: Audrey Garrett is a 55 y.o. female who has a long history of stage IV cirrhosis. [name] had a low hemoglobin and is received previous blood transfusion. Patient had a recent upper endoscopic exam which did not show any significant source of the GI bleed. As been followed by her gastroenterologist and serial hemoglobins have shown 8.0 in stable in the past. Patient states that she's noticed an increase in dark tarry stool with blood in primarily on the outside of the stool. She states she has some generalized fatigue and feels lightheaded without any actual syncopal episode.   Past Medical History  Diagnosis Date  . Depression   . Anxiety   . High risk medications (not anticoagulants) long-term use   . Vitamin D deficiency disease   . Lumbago   . Hypertension   . Chronic hepatitis C ([place])   . Alcohol abuse   . Bronchitis   . Arthropathy   . Back pain   . Shoulder pain   . Stress headaches   . Hyperglycemia   . SOB (shortness of breath)   . Muscle spasm   . Edema   . Cirrhosis ([place])   . Polyarthritis   . Fatigue   . Sinusitis   . RLS (restless legs syndrome)   . Alcoholic cirrhosis of liver without ascites ([place])   . Reflux   . Fibromyalgia   . Spine disorder   . Chronic LBP 12/25/2014    Overview:  Post extensive surgery   . DDD (degenerative disc disease), lumbar 01/01/2015  . Lumbar radicular pain 01/01/2015  . Neck pain 01/01/2015  . Left leg pain 01/01/2015  . Sacroiliac pain 01/01/2015  . Upper back pain 01/01/2015  . Urinary frequency   . Anemia   . Diabetes mellitus without complication [facility])     Patient Active Problem List   Diagnosis Date Noted  . GI bleed 09/22/2015  . Depression, major, recurrent, moderate ([place]) 05/27/2015  . Alcohol abuse 05/27/2015  . Abdominal pain, chronic, epigastric 05/27/2015  . Difficulty  urinating 05/26/2015  . Nephrolithiasis 05/26/2015  . Alcoholic cirrhosis of liver with ascites ([place]) 05/26/2015  . Liver cirrhosis, alcoholic ([place]) 50/27/7412  . Hepatitis C 05/22/2015  . Hypotension 05/22/2015  . Anemia 05/22/2015  . Thrombocytopenia ([place]) 05/22/2015  . Coagulopathy ([place]) 05/22/2015  . Hyperglycemia 05/22/2015  . Polyneuropathy ([place]) 05/22/2015  . Urinary retention 05/22/2015  . Ascites 05/21/2015  . Hepatic cirrhosis ([place]) 05/19/2015  . Hypomagnesemia (low magnesium levels) 04/07/2015  . Chronic pain 04/03/2015  . At risk for osteopenia 04/03/2015  . Lumbar spondylosis 04/03/2015  . Chronic lumbar radicular pain (Left) 04/03/2015  . Chronic neck pain 04/03/2015  . Chronic lower extremity pain (Left) 04/03/2015  . Chronic sacroiliac joint pain 04/03/2015  . Chronic upper back pain 04/03/2015  . Long term current use of opiate analgesic 04/03/2015  . Encounter for chronic pain management 04/03/2015  . Bilateral low back pain with left-sided sciatica 01/01/2015  . Chronic pain syndrome 01/01/2015  . Cervical radicular pain 01/01/2015  . Other long term (current) drug therapy 01/01/2015  . Uncomplicated opioid dependence ([place]) 01/01/2015  . Long term prescription opiate use 01/01/2015  . Lumbar facet arthropathy 01/01/2015  . 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 Lumbar facet syndrome (Bilateral) 01/01/2015  . Osteoarthritis of spine with radiculopathy, lumbar region 01/01/2015  .  Pain of paraspinal muscle 01/01/2015  . Low back pain with radiation 01/01/2015  . Opiate use (120 MME/Day) 01/01/2015  . Encounter for therapeutic drug level monitoring 01/01/2015  . Insomnia, persistent 12/25/2014  . Major depression in remission ([place]) 12/25/2014  . BP (high blood pressure) 12/25/2014  . Encounter for general adult medical examination without abnormal findings 12/25/2014  . HCV (hepatitis C virus) 12/25/2014  . ACUTE SINUSITIS, UNSPECIFIED 11/01/2009  .  BRONCHITIS, ACUTE 11/01/2009    Past Surgical History  Procedure Laterality Date  . Tubal ligation    . Tonsillectomy    . Back surgery  12/19/2011  . Esophagogastroduodenoscopy N/A 04/14/2015    Procedure: ESOPHAGOGASTRODUODENOSCOPY (EGD);  Surgeon: Lollie Sails, MD;  [facility] END[facility]COPY;  Service: Endoscopy;  Laterality: N/A;  . Esophagogastroduodenoscopy (egd) with propofol N/A 09/16/2015    Procedure: ESOPHAGOGASTRODUODENOSCOPY (EGD) WITH PROPOFOL;  Surgeon: Lollie Sails, MD;  Location[facility] ENDOSCOPY;  Service: Endoscopy;  Laterality: N/A;    Past Surgical History  Procedure Laterality Date  . Tubal ligation    . Tonsillectomy    . Back surgery  12/19/2011  . Esophagogastroduodenoscopy N/A 04/14/2015    Procedure: ESOPHAGOGASTRODUODENOSCOPY (EGD);  Surgeon: Lollie Sails, MD;  Location: [facility] ENDOSCOPY;  Service: Endoscopy;  Laterality: N/A;  . Esophagogastroduodenoscopy (egd) with propofol N/A 09/16/2015    Procedure: ESOPHAGOGASTRODUODENOSCOPY (EGD) WITH PROPOFOL;  Surgeon: Lollie Sails, MD;  Location: [facility] ENDOSCOPY;  Service: Endoscopy;  Laterality: N/A;    Current Outpatient Rx  Name  Route  Sig  Dispense  Refill  . ciprofloxacin (CIPRO) 500 MG tablet   Oral   Take 500 mg by mouth 2 (two) times a week. Pt takes on Monday and Friday.         . citalopram (CELEXA) 20 MG tablet   Oral   Take 20 mg by mouth daily.         . ferrous sulfate 325 (65 FE) MG tablet   Oral   Take 325 mg by mouth daily with breakfast.         . furosemide (LASIX) 40 MG tablet   Oral   Take 40 mg by mouth 2 (two) times daily.          Marland Kitchen gabapentin (NEURONTIN) 300 MG capsule   Oral   Take 300 mg by mouth 3 (three) times daily.         Marland Kitchen lactulose (CHRONULAC) 10 GM/15ML solution   Oral   Take 10 g by mouth 3 (three) times daily.         . Magnesium Oxide 250 MG TABS   Oral   Take 250 mg by mouth daily.         . metFORMIN (GLUCOPHAGE) 500 MG tablet    Oral   Take 500 mg by mouth 2 (two) times daily with a meal.          . Multiple Vitamins-Minerals (MULTIVITAMIN WITH MINERALS) tablet   Oral   Take 1 tablet by mouth daily.         . Oxycodone HCl 10 MG TABS   Oral   Take 20 mg by mouth every 6 (six) hours as needed (for pain).         . pantoprazole (PROTONIX) 40 MG tablet   Oral   Take 40 mg by mouth 2 (two) times daily before a meal.          . spironolactone (ALDACTONE) 100 MG tablet   Oral  Take 100 mg by mouth daily.         . sucralfate (CARAFATE) 1 GM/10ML suspension   Oral   Take 1 g by mouth 4 (four) times daily -  with meals and at bedtime.           Allergies:  Review of patient's allergies indicates no known allergies.  Family History: Family History  Problem Relation Age of Onset  . Stroke Mother   . Heart disease Mother   . [name] Father   . Kidney disease Neg Hx   . Bladder Cancer Neg Hx   . Breast cancer Sister 35    Social History: Social History  Substance Use Topics  . Smoking status: Current Every Day Smoker -- 0.50 packs/day for 40 years    Types: Cigarettes  . Smokeless tobacco: Never Used  . Alcohol Use: No     Comment: stopped drinking 05/12/15     Review of Systems:   10 point review of systems was performed and was otherwise negative:  Constitutional: No fever Eyes: No visual disturbances ENT: No sore throat, ear pain Cardiac: No chest pain Respiratory: No shortness of breath, wheezing, or stridor Abdomen: No new abdominal distention. Endocrine: No weight loss, No night sweats Extremities: No peripheral edema, cyanosis Skin: No rashes, easy bruising Neurologic: No focal weakness, trouble with speech or swollowing Urologic: No dysuria, Hematuria, or urinary frequency   Physical Exam:  ED Triage Vitals  Enc Vitals Group     BP 09/22/15 1446 118/58 mmHg     Pulse Rate 09/22/15 1446 91     Resp 09/22/15 1446 18     Temp 09/22/15 1446 98 F (36.7 C)      Temp Source 09/22/15 1637 Oral     SpO2 09/22/15 1446 100 %     Weight 09/22/15 1446 171 lb (77.565 kg)     Height 09/22/15 1446 '5\' 9"'$  (1.753 m)     Head Cir --      Peak Flow --      Pain Score 09/22/15 1449 0     Pain Loc --      Pain Edu? --      Excl. in [place]? --     General: Awake , Alert , and Oriented times 3; GCS 15 Head: Normal cephalic , atraumatic Eyes: Pupils equal , round, reactive to light Nose/Throat: No nasal drainage, patent upper airway without erythema or exudate.  Neck: Supple, Full range of motion, No anterior adenopathy or palpable thyroid masses Lungs: Clear to ascultation without wheezes , rhonchi, or rales Heart: Regular rate, regular rhythm without murmurs , gallops , or rubs Abdomen: Positive abdominal distention and with a mild fluid wave, non tender without rebound, guarding , or rigidity; bowel sounds positive and symmetric in all 4 quadrants. No organomegaly .        Extremities: 2 plus symmetric pulses. No edema, clubbing or cyanosis Neurologic: normal ambulation, Motor symmetric without deficits, sensory intact Skin: warm, dry, no rashes Rectal exam with chaperone present shows dark stool in the rectal vault which is guaiac positive with normal sphincter tone. No palpable masses.  Labs:   All laboratory work was reviewed including any pertinent negatives or positives listed below:  Labs Reviewed  COMPREHENSIVE METABOLIC PANEL - Abnormal; Notable for the following:    Sodium 129 (*)    Chloride 99 (*)    Glucose, Bld 179 (*)    Calcium 8.2 (*)    Total Protein 6.4 (*)  Albumin 3.0 (*)    All other components within normal limits  CBC - Abnormal; Notable for the following:    RBC 1.61 (*)    Hemoglobin 5.7 (*)    HCT 16.2 (*)    MCV 100.3 (*)    MCH 35.2 (*)    RDW 16.6 (*)    All other components within normal limits  FERRITIN  IRON AND TIBC  BASIC METABOLIC PANEL  CBC  POC OCCULT BLOOD, ED  TYPE AND SCREEN  PREPARE RBC (CROSSMATCH)   TYPE AND SCREEN  Hemoglobin here is 5.7   Critical Care: *  CRITICAL CARE Performed by: Daymon Larsen   Total critical care time: 33 minutes  Critical care time was exclusive of separately billable procedures and treating other patients.  Critical care was necessary to treat or prevent imminent or life-threatening deterioration.  Critical care was time spent personally by me on the following activities: development of treatment plan with patient and/or surrogate as well as nursing, discussions with consultants, evaluation of patient's response to treatment, examination of patient, obtaining history from patient or surrogate, ordering and performing treatments and interventions, ordering and review of laboratory studies, ordering and review of radiographic studies, pulse oximetry and re-evaluation of patient's condition. Initiation of blood transfusion for significant symptomatic anemia with a gastrointestinal bleed   ED Course: The patient's hemodynamics appears stable here in emergency department. The patient will be started on a blood transfusion. She is otherwise asymptomatic. [name] case was reviewed with the hospitalist team with likely gastroenterology consultation.    Assessment:  Gastrointestinal bleed Anemia Cirrhosis    Plan: * Inpatient management            Daymon Larsen, MD 09/22/15 [phone]

## 2015-09-22 NOTE — H&P (Signed)
[facility] [place] NAME: Audrey Garrett    MR#:  [id number]  DATE OF BIRTH:  Aug 20, 1960  DATE OF ADMISSION:  09/22/2015  PRIMARY CARE PHYSICIAN: Volanda Napoleon, MD   REQUESTING/REFERRING PHYSICIAN: Dr. Marcelene Butte  CHIEF COMPLAINT:   Chief Complaint  Patient presents with  . GI Bleeding  . Abnormal Lab    HISTORY OF PRESENT ILLNESS:  Audrey Garrett  is a 55 y.o. female with a known history of Alcoholic cirrhosis, hepatitis C, anemia of chronic disease and chronic black tarry stools presents to the emergency room sent in by PCP due to worsening anemia. [name] baseline hemoglobin is 8. Due to black tarry stools which were heme positive patient had an EGD done on 09/16/2015. This showed no ulcers but minimal erosive gastritis. No varices. Patient was discharged home with iron supplements and Protonix for a repeat hemoglobin checked in 5 days. Plan was to do a colonoscopy in 2 weeks if black tarry stools continued. Due to significant drop in hemoglobin patient was sent to the ER by PCP. Here patient's hemoglobin is 5.7 and is being admitted to the hospital.  As not had any alcohol since February 2017. She is supposed to start treatment for hepatitis C in August and then get onto a liver transplant list. She has had 2 paracentesis previously for ascites. Gait she doesn't feel her distention of abdomen is any worse in the last 2-3 weeks.  She had a fall earlier this month causing left hip pain. For this she saw her spine surgeon in [place] and had what sounds like intra-articular steroid shots with no improvement. She is waiting for an MRI as outpatient.  PAST MEDICAL HISTORY:   Past Medical History  Diagnosis Date  . Depression   . Anxiety   . High risk medications (not anticoagulants) long-term use   . Vitamin D deficiency disease   . Lumbago   . Hypertension   . Chronic hepatitis C ([place])   . Alcohol abuse   . Bronchitis    . Arthropathy   . Back pain   . Shoulder pain   . Stress headaches   . Hyperglycemia   . SOB (shortness of breath)   . Muscle spasm   . Edema   . Cirrhosis ([place])   . Polyarthritis   . Fatigue   . Sinusitis   . RLS (restless legs syndrome)   . Alcoholic cirrhosis of liver without ascites ([place])   . Reflux   . Fibromyalgia   . Spine disorder   . Chronic LBP 12/25/2014    Overview:  Post extensive surgery   . DDD (degenerative disc disease), lumbar 01/01/2015  . Lumbar radicular pain 01/01/2015  . Neck pain 01/01/2015  . Left leg pain 01/01/2015  . Sacroiliac pain 01/01/2015  . Upper back pain 01/01/2015  . Urinary frequency   . Anemia   . Diabetes mellitus without complication ([place])     PAST SURGICAL HISTORY:   Past Surgical History  Procedure Laterality Date  . Tubal ligation    . Tonsillectomy    . Back surgery  12/19/2011  . Esophagogastroduodenoscopy N/A 04/14/2015    Procedure: ESOPHAGOGASTRODUODENOSCOPY (EGD);  Surgeon: Lollie Sails, MD;  Location: [facility] ENDOSCOPY;  Service: Endoscopy;  Laterality: N/A;  . Esophagogastroduodenoscopy (egd) with propofol N/A 09/16/2015    Procedure: ESOPHAGOGASTRODUODENOSCOPY (EGD) WITH PROPOFOL;  Surgeon: Lollie Sails, MD;  Location: [facility] ENDOSCOPY;  Service: Endoscopy;  Laterality: N/A;  SOCIAL HISTORY:   Social History  Substance Use Topics  . Smoking status: Current Every Day Smoker -- 0.50 packs/day for 40 years    Types: Cigarettes  . Smokeless tobacco: Never Used  . Alcohol Use: No     Comment: stopped drinking 05/12/15    FAMILY HISTORY:   Family History  Problem Relation Age of Onset  . Stroke Mother   . Heart disease Mother   . [name] Father   . Kidney disease Neg Hx   . Bladder Cancer Neg Hx   . Breast cancer Sister 72    DRUG ALLERGIES:  No Known Allergies  REVIEW OF SYSTEMS:   Review of Systems  Constitutional: Positive for malaise/fatigue. Negative for fever, chills and weight loss.  HENT:  Negative for hearing loss and nosebleeds.   Eyes: Negative for blurred vision, double vision and pain.  Respiratory: Negative for cough, hemoptysis, sputum production, shortness of breath and wheezing.   Cardiovascular: Negative for chest pain, palpitations, orthopnea and leg swelling.  Gastrointestinal: Positive for abdominal pain and melena. Negative for nausea, vomiting, diarrhea and constipation.  Genitourinary: Negative for dysuria and hematuria.  Musculoskeletal: Positive for myalgias, back pain, joint pain and neck pain. Negative for falls.  Skin: Negative for rash.  Neurological: Positive for weakness. Negative for dizziness, tremors, sensory change, speech change, focal weakness, seizures and headaches.  Endo/Heme/Allergies: Does not bruise/bleed easily.  Psychiatric/Behavioral: Negative for depression and memory loss. The patient is nervous/anxious.     MEDICATIONS AT HOME:   Prior to Admission medications   Medication Sig Start Date End Date Taking? Authorizing Provider  ciprofloxacin (CIPRO) 500 MG tablet Take 500 mg by mouth 2 (two) times a week. Pt takes on Monday and Friday.   Yes Historical Provider, MD  citalopram (CELEXA) 20 MG tablet Take 20 mg by mouth daily.   Yes Historical Provider, MD  ferrous sulfate 325 (65 FE) MG tablet Take 325 mg by mouth daily with breakfast.   Yes Historical Provider, MD  furosemide (LASIX) 40 MG tablet Take 40 mg by mouth 2 (two) times daily.    Yes Historical Provider, MD  gabapentin (NEURONTIN) 300 MG capsule Take 300 mg by mouth 3 (three) times daily.   Yes Historical Provider, MD  lactulose (CHRONULAC) 10 GM/15ML solution Take 10 g by mouth 3 (three) times daily.   Yes Historical Provider, MD  Magnesium Oxide 250 MG TABS Take 250 mg by mouth daily.   Yes Historical Provider, MD  metFORMIN (GLUCOPHAGE) 500 MG tablet Take 500 mg by mouth 2 (two) times daily with a meal.    Yes Historical Provider, MD  Multiple Vitamins-Minerals (MULTIVITAMIN  WITH MINERALS) tablet Take 1 tablet by mouth daily.   Yes Historical Provider, MD  Oxycodone HCl 10 MG TABS Take 20 mg by mouth every 6 (six) hours as needed (for pain).   Yes Historical Provider, MD  pantoprazole (PROTONIX) 40 MG tablet Take 40 mg by mouth 2 (two) times daily before a meal.    Yes Historical Provider, MD  spironolactone (ALDACTONE) 100 MG tablet Take 100 mg by mouth daily.   Yes Historical Provider, MD  sucralfate (CARAFATE) 1 GM/10ML suspension Take 1 g by mouth 4 (four) times daily -  with meals and at bedtime.   Yes Historical Provider, MD     VITAL SIGNS:  Blood pressure 121/68, pulse 93, temperature 98.6 F (37 C), temperature source Oral, resp. rate 20, height '5\' 9"'$  (1.753 m), weight 77.565 kg (171 lb),  SpO2 100 %.  PHYSICAL EXAMINATION:  Physical Exam  GENERAL:  55 y.o.-year-old patient lying in the bed with no acute distress.  EYES: Pupils equal, round, reactive to light and accommodation. No scleral icterus. Extraocular muscles intact.  HEENT: Head atraumatic, normocephalic. Oropharynx and nasopharynx clear. No oropharyngeal erythema, moist oral mucosa  NECK:  Supple, no jugular venous distention. No thyroid enlargement, no tenderness.  LUNGS: Normal breath sounds bilaterally, no wheezing, rales, rhonchi. No use of accessory muscles of respiration.  CARDIOVASCULAR: S1, S2 normal. No murmurs, rubs, or gallops.  ABDOMEN: Soft, Distended with umbilical hernia. Bowel sounds present. EXTREMITIES: No  cyanosis, or clubbing. + 2 pedal & radial pulses b/l.  Bilateral lower extremity edema NEUROLOGIC: Cranial nerves II through XII are intact. No focal Motor or sensory deficits appreciated b/l PSYCHIATRIC: The patient is alert and oriented x 3. Anxious and tearful SKIN: No obvious rash, lesion, or ulcer.   LABORATORY PANEL:   CBC  Recent Labs Lab 09/22/15 1458  WBC 6.8  HGB 5.7*  HCT 16.2*  PLT 151    ------------------------------------------------------------------------------------------------------------------  Chemistries   Recent Labs Lab 09/22/15 1458  NA 129*  K 3.5  CL 99*  CO2 22  GLUCOSE 179*  BUN 12  CREATININE 0.59  CALCIUM 8.2*  AST 33  ALT 19  ALKPHOS 76  BILITOT 0.5   ------------------------------------------------------------------------------------------------------------------  Cardiac Enzymes No results for input(s): TROPONINI in the last 168 hours. ------------------------------------------------------------------------------------------------------------------  RADIOLOGY:  No results found.   IMPRESSION AND PLAN:   * Acute blood loss anemia due to GI bleed Patient will be on clear liquids. Continue Protonix twice a day. Recent EGD showed erosive gastritis. No significant ulcers. We'll consult GI for colonoscopy while inpatient. Transfuse 2 units packed RBC. Monitor hemoglobin closely. Transfuse further if hemoglobin less than 7.  * Cirrhosis with ascites Continue Lasix and Aldactone. Patient is supposed to start her treatment for hepatitis C in August. Later will be on [facility] list [facility].  * Chronic pain syndrome Continue home medications with oxycodone. Made this more frequent at patient's request due to recent fall.  * Chronic mild hypervolemic hyponatremia is stable  * DVT prophylaxis with SCDs   All the records are reviewed and case discussed with ED provider. Management plans discussed with the patient, family and they are in agreement.  CODE STATUS: FULL CODE  TOTAL TIME TAKING CARE OF THIS PATIENT: 45 minutes.   Hillary Bow R M.D on 09/22/2015 at 4:53 PM  Between 7am to 6pm - Pager - [phone]  After 6pm go to [email or web address] - password [email or web address] [place] [facility]  Office  [phone]  CC: Primary care physician; Volanda Napoleon, MD  Note: This dictation was prepared with Dragon  dictation along with smaller phrase technology. Any transcriptional errors that result from this process are unintentional.

## 2015-09-22 NOTE — ED Notes (Signed)
Was sent in by PCP for possible blood transfusion.  Dark stools. And abnormal lab work

## 2015-09-23 LAB — CBC
HCT: 19.9 % — ABNORMAL LOW (ref 35.0–47.0)
Hemoglobin: 6.9 g/dL — ABNORMAL LOW (ref 12.0–16.0)
MCH: 32.8 pg (ref 26.0–34.0)
MCHC: 34.7 g/dL (ref 32.0–36.0)
MCV: 94.5 fL (ref 80.0–100.0)
Platelets: 114 10*3/uL — ABNORMAL LOW (ref 150–440)
RBC: 2.11 MIL/uL — ABNORMAL LOW (ref 3.80–5.20)
RDW: 20.7 % — ABNORMAL HIGH (ref 11.5–14.5)
WBC: 5.1 10*3/uL (ref 3.6–11.0)

## 2015-09-23 LAB — BASIC METABOLIC PANEL
Anion gap: 6 (ref 5–15)
BUN: 9 mg/dL (ref 6–20)
CO2: 24 mmol/L (ref 22–32)
Calcium: 7.8 mg/dL — ABNORMAL LOW (ref 8.9–10.3)
Chloride: 103 mmol/L (ref 101–111)
Creatinine, Ser: 0.53 mg/dL (ref 0.44–1.00)
GFR calc Af Amer: >60 mL/min (ref >60)
GFR calc non Af Amer: >60 mL/min (ref >60)
Glucose, Bld: 151 mg/dL — ABNORMAL HIGH (ref 65–99)
Potassium: 3.9 mmol/L (ref 3.5–5.1)
Sodium: 133 mmol/L — ABNORMAL LOW (ref 135–145)

## 2015-09-23 LAB — GLUCOSE, CAPILLARY
Glucose-Capillary: 107 mg/dL — ABNORMAL HIGH (ref 65–99)
Glucose-Capillary: 110 mg/dL — ABNORMAL HIGH (ref 65–99)
Glucose-Capillary: 120 mg/dL — ABNORMAL HIGH (ref 65–99)
Glucose-Capillary: 122 mg/dL — ABNORMAL HIGH (ref 65–99)
Glucose-Capillary: 130 mg/dL — ABNORMAL HIGH (ref 65–99)
Glucose-Capillary: 167 mg/dL — ABNORMAL HIGH (ref 65–99)
Glucose-Capillary: 175 mg/dL — ABNORMAL HIGH (ref 65–99)

## 2015-09-23 LAB — PREPARE RBC (CROSSMATCH)

## 2015-09-23 MED ORDER — PEG 3350-KCL-NA BICARB-NACL 420 G PO SOLR
4000.0000 mL | Freq: Once | ORAL | Status: AC
  Administered 2015-09-23: 4000 mL via ORAL
  Filled 2015-09-23: qty 4000

## 2015-09-23 MED ORDER — PANTOPRAZOLE SODIUM 40 MG PO TBEC
40.0000 mg | DELAYED_RELEASE_TABLET | Freq: Two times a day (BID) | ORAL | Status: DC
  Administered 2015-09-23 – 2015-09-26 (×4): 40 mg via ORAL
  Filled 2015-09-23 (×4): qty 1

## 2015-09-23 MED ORDER — SODIUM CHLORIDE 0.9 % IV BOLUS (SEPSIS)
1000.0000 mL | Freq: Once | INTRAVENOUS | Status: AC
Start: 2015-09-23 — End: 2015-09-23
  Administered 2015-09-23: 1000 mL via INTRAVENOUS

## 2015-09-23 MED ORDER — PANTOPRAZOLE SODIUM 40 MG PO TBEC
40.0000 mg | DELAYED_RELEASE_TABLET | Freq: Every day | ORAL | Status: DC
  Administered 2015-09-23: 40 mg via ORAL
  Filled 2015-09-23: qty 1

## 2015-09-23 MED ORDER — SODIUM CHLORIDE 0.9 % IV SOLN: Freq: Once | INTRAVENOUS | Status: DC

## 2015-09-23 MED ORDER — FAMOTIDINE IN NACL 20-0.9 MG/50ML-% IV SOLN
20.0000 mg | Freq: Two times a day (BID) | INTRAVENOUS | Status: DC
  Administered 2015-09-23: 20 mg via INTRAVENOUS
  Filled 2015-09-23 (×2): qty 50

## 2015-09-23 MED ORDER — OXYCODONE HCL 5 MG PO TABS
20.0000 mg | ORAL_TABLET | Freq: Four times a day (QID) | ORAL | Status: DC | PRN
  Administered 2015-09-23 – 2015-09-24 (×4): 20 mg via ORAL
  Filled 2015-09-23 (×5): qty 4

## 2015-09-23 NOTE — Progress Notes (Signed)
[facility] [place] NAME: Audrey Garrett    MR#:  [id number]  DATE OF BIRTH:  1960-09-04  SUBJECTIVE:  CHIEF COMPLAINT:   Chief Complaint  Patient presents with  . GI Bleeding  . Abnormal Lab  Hb 6.9 after 2 PRBC transfusion, feels somewhat dizzi REVIEW OF SYSTEMS:  Review of Systems  Constitutional: Negative for fever, weight loss, malaise/fatigue and diaphoresis.  HENT: Negative for ear discharge, ear pain, hearing loss, nosebleeds, sore throat and tinnitus.   Eyes: Negative for blurred vision and pain.  Respiratory: Negative for cough, hemoptysis, shortness of breath and wheezing.   Cardiovascular: Negative for chest pain, palpitations, orthopnea and leg swelling.  Gastrointestinal: Negative for heartburn, nausea, vomiting, abdominal pain, diarrhea, constipation and blood in stool.  Genitourinary: Negative for dysuria, urgency and frequency.  Musculoskeletal: Negative for myalgias and back pain.  Skin: Negative for itching and rash.  Neurological: Positive for dizziness. Negative for tingling, tremors, focal weakness, seizures, weakness and headaches.  Psychiatric/Behavioral: Negative for depression. The patient is not nervous/anxious.    DRUG ALLERGIES:  No Known Allergies VITALS:  Blood pressure 105/61, pulse 76, temperature 98.3 F (36.8 C), temperature source Oral, resp. rate 18, height '5\' 6"'$  (1.676 m), weight 79.561 kg (175 lb 6.4 oz), SpO2 99 %. PHYSICAL EXAMINATION:  Physical Exam  Constitutional: She is oriented to person, place, and time and well-developed, well-nourished, and in no distress.  HENT:  Head: Normocephalic and atraumatic.  Eyes: Conjunctivae and EOM are normal. Pupils are equal, round, and reactive to light.  Neck: Normal range of motion. Neck supple. No tracheal deviation present. No thyromegaly present.  Cardiovascular: Normal rate, regular rhythm and normal heart sounds.   Pulmonary/Chest: Effort normal  and breath sounds normal. No respiratory distress. She has no wheezes. She exhibits no tenderness.  Abdominal: Soft. Bowel sounds are normal. She exhibits no distension. There is no tenderness.  Musculoskeletal: Normal range of motion.  Neurological: She is alert and oriented to person, place, and time. No cranial nerve deficit.  Skin: Skin is warm and dry. No rash noted.  Psychiatric: Mood and affect normal.   LABORATORY PANEL:   CBC  Recent Labs Lab 09/23/15 0324  WBC 5.1  HGB 6.9*  HCT 19.9*  PLT 114*   ------------------------------------------------------------------------------------------------------------------ Chemistries   Recent Labs Lab 09/22/15 1458 09/23/15 0324  NA 129* 133*  K 3.5 3.9  CL 99* 103  CO2 22 24  GLUCOSE 179* 151*  BUN 12 9  CREATININE 0.59 0.53  CALCIUM 8.2* 7.8*  AST 33  --   ALT 19  --   ALKPHOS 76  --   BILITOT 0.5  --    RADIOLOGY:  No results found. ASSESSMENT AND PLAN:  * Acute blood loss anemia due to GI bleed - Continue Protonix twice a day. - Recent EGD showed erosive gastritis. No significant ulcers. - GI planning for colonoscopy tomorrow - Transfused 2 units packed RBC on admission.  - will transfuse 2 more units today as Hb still 6.9  * Cirrhosis with ascites Continue Lasix and Aldactone. Patient is supposed to start her treatment for hepatitis C in August. Later will be on transplant list at [facility].  * Chronic pain syndrome Continue home medications with oxycodone. Made this more frequent at patient's request due to recent fall.  * Chronic mild hypervolemic hyponatremia: Na improved from 129->133  * DVT prophylaxis with SCDs   Discontinue tele  All the records are  reviewed and case discussed with Care Management/Social Worker. Management plans discussed with the patient, family and they are in agreement.  CODE STATUS: FULL CODE  TOTAL TIME TAKING CARE OF THIS PATIENT: 35 minutes.   More than 50% of the time  was spent in counseling/coordination of care: YES  POSSIBLE D/C IN 1-2 DAYS, DEPENDING ON CLINICAL CONDITION. And GI eval   [facility], Deah Ottaway M.D on 09/23/2015 at 1:21 PM  Between 7am to 6pm - Pager - [phone]  After 6pm go to [email or web address] - Proofreader  [facility]  Office  [phone]  CC: Primary care physician; Volanda Napoleon, MD  Note: This dictation was prepared with Dragon dictation along with smaller phrase technology. Any transcriptional errors that result from this process are unintentional.

## 2015-09-23 NOTE — Consult Note (Signed)
Consultation  Referring Provider:     Dr Lars Mage Admit date: 09/22/15 Consult date : 09/23/15        Reason for Consultation:     GIB         HPI:   Audrey Garrett is a 55 y.o. 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  Patient underwent EGD 6/20 for melena with the findings of a prepyloric erosion/nonbleeding gastropathy. There were no esophageal varices. Turned in a stool for H pylori testing later that week which was negative.  Plan was for patient to follow up in clinic this week and schedule colonoscopy for colon cancer surveillance. Spoke with patient yesterday as she had reported having black stools over the weekend. Outpatient cbc and pt/inr was assessed and patient was to be seen in clinic, however cbc returned with hgb of 5.5, platelets 140, wbc 6.6. Pt/inr was 15.5 and 1.4. Was then instructed to go to ED for emergent eval of anemia- subsequently was admitted and has received 2U prbc with some improvement to her hgb which is 6.0 today, platelets 114. Of note her MCV has been >100. Iron high at 301 (on MVI with Fe), ferritin normal at 40.  Reports today that she has been on both PPI and carafate which she states she is very adherent to and denies all NSAIDs. Denies etoh- states none since February. States she has felt gassy and been passing increased flatus, but has not had an diarrhea or constipation. Now reports her stools as more of a dark brown instead of black. Denies abdominal pain, dyspepsia, NV, problems with swallowing. Denies further falls, injury, diet changes, and is unable to identify any precipitating event that would have triggered any GIB- this seems to have happened now for the last 2-3 weekends. States she is also adherent to her cirrhosis meds: lactulose, aldactone, lasix, cipro prophylaxis. Had some diuretic adjustment 2w ago  with improvement to her lower leg edema. No furhter GI concerns. She is UTD on [facility] screening. HIV negative. Has demonstrated immunity to HAV but not HBV; HBV prophylaxis has been recommended for her.  Last paracentesis 2/17.     Past Medical History  Diagnosis Date  . Depression   . Anxiety   . High risk medications (not anticoagulants) long-term use   . Vitamin D deficiency disease   . Lumbago   . Hypertension   . Chronic hepatitis C ([place])   . Alcohol abuse   . Bronchitis   . Arthropathy   . Back pain   . Shoulder pain   . Stress headaches   . Hyperglycemia   . SOB (shortness of breath)   . Muscle spasm   . Edema   . Cirrhosis ([place])   . Polyarthritis   . Fatigue   . Sinusitis   . RLS (restless legs syndrome)   . Alcoholic cirrhosis of liver without ascites ([place])   . Reflux   . Fibromyalgia   . Spine disorder   . Chronic LBP 12/25/2014    Overview:  Post extensive surgery   . DDD (degenerative disc disease), lumbar 01/01/2015  . Lumbar radicular pain 01/01/2015  . Neck pain 01/01/2015  . Left leg pain 01/01/2015  . Sacroiliac pain 01/01/2015  . Upper back pain 01/01/2015  . Urinary frequency   . Anemia   . Diabetes mellitus without complication ([place])  Past Surgical History  Procedure Laterality Date  . Tubal ligation    . Tonsillectomy    . Back surgery  12/19/2011  . Esophagogastroduodenoscopy N/A 04/14/2015    Procedure: ESOPHAGOGASTRODUODENOSCOPY (EGD);  Surgeon: Lollie Sails, MD;  Location: [facility] ENDOSCOPY;  Service: Endoscopy;  Laterality: N/A;  . Esophagogastroduodenoscopy (egd) with propofol N/A 09/16/2015    Procedure: ESOPHAGOGASTRODUODENOSCOPY (EGD) WITH PROPOFOL;  Surgeon: Lollie Sails, MD;  Location: [facility] ENDOSCOPY;  Service: Endoscopy;  Laterality: N/A;    Family History  Problem Relation Age of Onset  . Stroke Mother   . Heart disease Mother   . [name] Father   . Kidney disease Neg Hx   . Bladder Cancer Neg Hx   . Breast cancer Sister  55    Social History  Substance Use Topics  . Smoking status: Current Every Day Smoker -- 0.50 packs/day for 40 years    Types: Cigarettes  . Smokeless tobacco: Never Used  . Alcohol Use: No     Comment: stopped drinking 05/12/15    Prior to Admission medications   Medication Sig Start Date End Date Taking? Authorizing Provider  ciprofloxacin (CIPRO) 500 MG tablet Take 500 mg by mouth 2 (two) times a week. Pt takes on Monday and Friday.   Yes Historical Provider, MD  citalopram (CELEXA) 20 MG tablet Take 20 mg by mouth daily.   Yes Historical Provider, MD  ferrous sulfate 325 (65 FE) MG tablet Take 325 mg by mouth daily with breakfast.   Yes Historical Provider, MD  furosemide (LASIX) 40 MG tablet Take 40 mg by mouth 2 (two) times daily.    Yes Historical Provider, MD  gabapentin (NEURONTIN) 300 MG capsule Take 300 mg by mouth 3 (three) times daily.   Yes Historical Provider, MD  lactulose (CHRONULAC) 10 GM/15ML solution Take 10 g by mouth 3 (three) times daily.   Yes Historical Provider, MD  Magnesium Oxide 250 MG TABS Take 250 mg by mouth daily.   Yes Historical Provider, MD  metFORMIN (GLUCOPHAGE) 500 MG tablet Take 500 mg by mouth 2 (two) times daily with a meal.    Yes Historical Provider, MD  Multiple Vitamins-Minerals (MULTIVITAMIN WITH MINERALS) tablet Take 1 tablet by mouth daily.   Yes Historical Provider, MD  Oxycodone HCl 10 MG TABS Take 20 mg by mouth every 6 (six) hours as needed (for pain).   Yes Historical Provider, MD  pantoprazole (PROTONIX) 40 MG tablet Take 40 mg by mouth 2 (two) times daily before a meal.    Yes Historical Provider, MD  spironolactone (ALDACTONE) 100 MG tablet Take 100 mg by mouth daily.   Yes Historical Provider, MD  sucralfate (CARAFATE) 1 GM/10ML suspension Take 1 g by mouth 4 (four) times daily -  with meals and at bedtime.   Yes Historical Provider, MD    Current Facility-Administered Medications  Medication Dose Route Frequency Provider Last  Rate Last Dose  . 0.9 %  sodium chloride infusion  250 mL Intravenous PRN Hillary Bow, MD      . acetaminophen (TYLENOL) tablet 650 mg  650 mg Oral Q6H PRN Hillary Bow, MD       Or  . acetaminophen (TYLENOL) suppository 650 mg  650 mg Rectal Q6H PRN Srikar Sudini, MD      . albuterol (PROVENTIL) (2.5 MG/3ML) 0.083% nebulizer solution 2.5 mg  2.5 mg Nebulization Q2H PRN Srikar Sudini, MD      . ciprofloxacin (CIPRO) tablet 500 mg  500 mg  Oral Once per day on Mon Thu Srikar Sudini, MD      . citalopram (CELEXA) tablet 20 mg  20 mg Oral Daily Srikar Sudini, MD   20 mg at 09/23/15 1036  . famotidine (PEPCID) IVPB 20 mg premix  20 mg Intravenous Q12H Harrie Foreman, MD   20 mg at 09/23/15 9509  . furosemide (LASIX) tablet 40 mg  40 mg Oral BID Srikar Sudini, MD      . gabapentin (NEURONTIN) capsule 300 mg  300 mg Oral TID Hillary Bow, MD   300 mg at 09/23/15 1036  . insulin aspart (novoLOG) injection 0-9 Units  0-9 Units Subcutaneous Q4H Hillary Bow, MD   2 Units at 09/23/15 1125  . lactulose (CHRONULAC) 10 GM/15ML solution 10 g  10 g Oral TID Hillary Bow, MD   10 g at 09/23/15 1036  . magnesium oxide (MAG-OX) tablet 400 mg  400 mg Oral Daily Hillary Bow, MD   400 mg at 09/23/15 1036  . ondansetron (ZOFRAN) tablet 4 mg  4 mg Oral Q6H PRN Hillary Bow, MD       Or  . ondansetron (ZOFRAN) injection 4 mg  4 mg Intravenous Q6H PRN Srikar Sudini, MD      . oxyCODONE (Oxy IR/ROXICODONE) immediate release tablet 20 mg  20 mg Oral Q4H PRN Hillary Bow, MD   20 mg at 09/23/15 0409  . sodium chloride flush (NS) 0.9 % injection 3 mL  3 mL Intravenous Q12H Srikar Sudini, MD   3 mL at 09/23/15 1037  . sodium chloride flush (NS) 0.9 % injection 3 mL  3 mL Intravenous Q12H Srikar Sudini, MD   3 mL at 09/23/15 1036  . sodium chloride flush (NS) 0.9 % injection 3 mL  3 mL Intravenous PRN Srikar Sudini, MD      . spironolactone (ALDACTONE) tablet 100 mg  100 mg Oral Daily Srikar Sudini, MD      .  sucralfate (CARAFATE) 1 GM/10ML suspension 1 g  1 g Oral TID WC & HS Srikar Sudini, MD   1 g at 09/23/15 1125    Allergies as of 09/22/2015  . (No Known Allergies)     Review of Systems:    All systems reviewed and negative except where noted in HPI, with the exception of fatigue, depression/anxiety: follows with Dr Rockney Ghee. Follows with pain management for her chronic back pain.    Physical Exam:  Vital signs in last 24 hours: Temp:  [97.9 F (36.6 C)-99.4 F (37.4 C)] 98.3 F (36.8 C) (06/27 1141) Pulse Rate:  [71-103] 76 (06/27 1141) Resp:  [13-20] 18 (06/27 1141) BP: (88-126)/(50-74) 105/61 mmHg (06/27 1141) SpO2:  [96 %-100 %] 99 % (06/27 1141) Weight:  [77.565 kg (171 lb)-79.561 kg (175 lb 6.4 oz)] 79.561 kg (175 lb 6.4 oz) (06/27 0407) Last BM Date: 09/22/15 General:   Pleasant woman in NAD Head:  Normocephalic and atraumatic. Eyes:   No icterus.   Conjunctiva pink. Ears:  Normal auditory acuity. Mouth: Mucosa pink moist, no lesions. Neck:  Supple; no masses felt Lungs:  Respirations even and unlabored. Lungs clear to auscultation bilaterally.   No wheezes, crackles, or rhonchi.  Heart:  S1S2, RRR, no MRG. No edema. Abdomen:   Flat, soft, protuberant, nontender. Normal bowel sounds. No appreciable masses or hepatomegaly. No rebound signs or other peritoneal signs. Rectal:  External hemorrhoids. Small elongated soft mobile irregularites inside the anal canal consistent with internal hemorrhoids. Stool brown with some red  flecks, heme positive.   Msk:  MAEW x4, No clubbing or cyanosis. Strength 5/5. Symmetrical without gross deformities. Neurologic:  Alert and  oriented x4;  Cranial nerves II-XII intact.  Skin:  Warm, dry, pink without significant lesions or rashes. Psych:  Alert and cooperative. Normal affect.  LAB RESULTS:  Recent Labs  09/22/15 1458 09/23/15 0324  WBC 6.8 5.1  HGB 5.7* 6.9*  HCT 16.2* 19.9*  PLT 151 114*   BMET  Recent Labs  09/22/15 1458  09/23/15 0324  NA 129* 133*  K 3.5 3.9  CL 99* 103  CO2 22 24  GLUCOSE 179* 151*  BUN 12 9  CREATININE 0.59 0.53  CALCIUM 8.2* 7.8*   LFT  Recent Labs  09/22/15 1458  PROT 6.4*  ALBUMIN 3.0*  AST 33  ALT 19  ALKPHOS 76  BILITOT 0.5   PT/INR No results for input(s): LABPROT, INR in the last 72 hours.  STUDIES: No results found.     Impression / Plan:   1. Anemia and rectal bleeding. May have a component of anal outlet bleeding but 2.5g is a large drop for hemorrhoid bleeding. Will discuss  colonoscopy  with Dr Gustavo Lah.  Discussed the indications, risks and benefits with her and she is agreeable. Agree with transfusions-and would continue to monitor hemoglobin, amy require additional prbcs. Will change her pepcid to pantoprazole.  Will need pt/inr and a platelet count the day of procedure. Agree with cliq diet: would avoid red and purple liquids. Given her high Fe and normal ferritin would have her change her MVI to one without Fe. 2. Cirrhosis: MELD 10. CTP class B Thank you very much for this consult. These services were provided by Stephens November, NP-C, in collaboration with Lollie Sails, MD, with whom I have discussed this patient in full.   Stephens November, NP-C

## 2015-09-23 NOTE — Progress Notes (Signed)
Pt was explained s/s of blood reaction. Pt verbalizes understanding. Blood verify by 2 RNs. Will monitor closely

## 2015-09-23 NOTE — Progress Notes (Signed)
Notified Dr Marcille Blanco of low blood pressure. 1000cc bolus ordered

## 2015-09-23 NOTE — Consult Note (Addendum)
Subjective: Patient seen for anemia, GI blood loss, cirrhosis. Please see fullGI consult.  Paitent currently undergoing tfx prbc.  Hemodynamically stable. C/o back pain in the setting of known h/o chronic back pain, follows with pain management as o/p, takes meds for this.   Objective: Vital signs in last 24 hours: Temp:  [97.9 F (36.6 C)-99.4 F (37.4 C)] 98.3 F (36.8 C) (06/27 1530) Pulse Rate:  [70-103] 70 (06/27 1530) Resp:  [16-20] 20 (06/27 1530) BP: (88-126)/(50-64) 98/61 mmHg (06/27 1530) SpO2:  [96 %-100 %] 100 % (06/27 1530) Weight:  [78.744 kg (173 lb 9.6 oz)-79.561 kg (175 lb 6.4 oz)] 79.561 kg (175 lb 6.4 oz) (06/27 0407) Blood pressure 98/61, pulse 70, temperature 98.3 F (36.8 C), temperature source Oral, resp. rate 20, height '5\' 6"'$  (1.676 m), weight 79.561 kg (175 lb 6.4 oz), SpO2 100 %.   Intake/Output from previous day: 06/26 0701 - 06/27 0700 In: 589 [I.V.:6; Blood:583] Out: -   Intake/Output this shift: Total I/O In: 1130 [P.O.:1080; IV Piggyback:50] Out: 0    General appearance:  55 f no distress Resp:  bcta Cardio:  rrr GI:  Abdomen distended, mildly tense ascites, no pain to palpation no peritoneal signs. bs positive.  Extremities:  No cce   Lab Results: Results for orders placed or performed during the hospital encounter of 09/22/15 (from the past 24 hour(s))  Glucose, capillary     Status: Abnormal   Collection Time: 09/22/15  8:02 PM  Result Value Ref Range   Glucose-Capillary 163 (H) 65 - 99 mg/dL   Comment 1 Notify RN   Glucose, capillary     Status: Abnormal   Collection Time: 09/22/15  9:53 PM  Result Value Ref Range   Glucose-Capillary 230 (H) 65 - 99 mg/dL   Comment 1 Notify RN   Glucose, capillary     Status: Abnormal   Collection Time: 09/23/15  1:17 AM  Result Value Ref Range   Glucose-Capillary 130 (H) 65 - 99 mg/dL   Comment 1 Notify RN   Basic metabolic panel     Status: Abnormal   Collection Time: 09/23/15  3:24 AM  Result  Value Ref Range   Sodium 133 (L) 135 - 145 mmol/L   Potassium 3.9 3.5 - 5.1 mmol/L   Chloride 103 101 - 111 mmol/L   CO2 24 22 - 32 mmol/L   Glucose, Bld 151 (H) 65 - 99 mg/dL   BUN 9 6 - 20 mg/dL   Creatinine, Ser 0.53 0.44 - 1.00 mg/dL   Calcium 7.8 (L) 8.9 - 10.3 mg/dL   GFR calc non Af Amer >60 >60 mL/min   GFR calc Af Amer >60 >60 mL/min   Anion gap 6 5 - 15  CBC     Status: Abnormal   Collection Time: 09/23/15  3:24 AM  Result Value Ref Range   WBC 5.1 3.6 - 11.0 K/uL   RBC 2.11 (L) 3.80 - 5.20 MIL/uL   Hemoglobin 6.9 (L) 12.0 - 16.0 g/dL   HCT 19.9 (L) 35.0 - 47.0 %   MCV 94.5 80.0 - 100.0 fL   MCH 32.8 26.0 - 34.0 pg   MCHC 34.7 32.0 - 36.0 g/dL   RDW 20.7 (H) 11.5 - 14.5 %   Platelets 114 (L) 150 - 440 K/uL  Glucose, capillary     Status: Abnormal   Collection Time: 09/23/15  4:00 AM  Result Value Ref Range   Glucose-Capillary 167 (H) 65 - 99 mg/dL  Glucose, capillary     Status: Abnormal   Collection Time: 09/23/15  7:46 AM  Result Value Ref Range   Glucose-Capillary 110 (H) 65 - 99 mg/dL  Glucose, capillary     Status: Abnormal   Collection Time: 09/23/15 11:21 AM  Result Value Ref Range   Glucose-Capillary 175 (H) 65 - 99 mg/dL  Prepare RBC     Status: None   Collection Time: 09/23/15  1:00 PM  Result Value Ref Range   Order Confirmation ORDER PROCESSED BY BLOOD BANK   Glucose, capillary     Status: Abnormal   Collection Time: 09/23/15  3:39 PM  Result Value Ref Range   Glucose-Capillary 122 (H) 65 - 99 mg/dL      Recent Labs  09/22/15 1458 09/23/15 0324  WBC 6.8 5.1  HGB 5.7* 6.9*  HCT 16.2* 19.9*  PLT 151 114*   BMET  Recent Labs  09/22/15 1458 09/23/15 0324  NA 129* 133*  K 3.5 3.9  CL 99* 103  CO2 22 24  GLUCOSE 179* 151*  BUN 12 9  CREATININE 0.59 0.53  CALCIUM 8.2* 7.8*   LFT  Recent Labs  09/22/15 1458  PROT 6.4*  ALBUMIN 3.0*  AST 33  ALT 19  ALKPHOS 76  BILITOT 0.5   PT/INR No results for input(s): LABPROT, INR  in the last 72 hours. Hepatitis Panel No results for input(s): HEPBSAG, HCVAB, HEPAIGM, HEPBIGM in the last 72 hours. C-Diff No results for input(s): CDIFFTOX in the last 72 hours. No results for input(s): CDIFFPCR in the last 72 hours.   Studies/Results: No results found.  Scheduled Inpatient Medications:   . sodium chloride   Intravenous Once  . ciprofloxacin  500 mg Oral Once per day on Mon Thu  . citalopram  20 mg Oral Daily  . furosemide  40 mg Oral BID  . gabapentin  300 mg Oral TID  . insulin aspart  0-9 Units Subcutaneous Q4H  . lactulose  10 g Oral TID  . magnesium oxide  400 mg Oral Daily  . pantoprazole  40 mg Oral BID  . polyethylene glycol-electrolytes  4,000 mL Oral Once  . sodium chloride flush  3 mL Intravenous Q12H  . sodium chloride flush  3 mL Intravenous Q12H  . spironolactone  100 mg Oral Daily  . sucralfate  1 g Oral TID WC & HS    Continuous Inpatient Infusions:     PRN Inpatient Medications:  sodium chloride, acetaminophen **OR** acetaminophen, albuterol, ondansetron **OR** ondansetron (ZOFRAN) IV, oxyCODONE, sodium chloride flush  Miscellaneous:   Assessment:  1) cp class b cirrhosis, increased ascites currently.  2) GI blood loss.  Recent EGD negative for portal htn or varices. Minimal erosive gastritis, currently on appropriate meds.  Trying to arrange colonoscopy as o/p recently. Patient has seen black stools recently, denies taking nsaids or asa.  3) history of etoh abuse, abstinent for 4 months.  Awaiting 6 month abstinence to treat hepatitis C 4) hepatitis C.   Plan:  1) paracentesis tomorrow 2) will prep for colonoscopy tomorrow pm.   I have discussed the risks benefits and complications of procedures to include not limited to bleeding, infection, perforation and the risk of sedation and the patient wishes to proceed.Will recheck cbc and INR in am./   Lollie Sails MD 09/23/2015, 5:38 PM

## 2015-09-23 NOTE — Progress Notes (Signed)
Pt is doing well with blood transfusion. Denies any symptoms of a reaction

## 2015-09-23 NOTE — Progress Notes (Signed)
Notified Dr. Manuella Ghazi that patient BP is running in the mid to low 90s and patient already received a bolus this morning with night shift. Patient is due oral lasix and spironolactone. Order to hold both meds.

## 2015-09-24 ENCOUNTER — Encounter: Payer: Self-pay | Admitting: Registered Nurse

## 2015-09-24 ENCOUNTER — Encounter: Payer: Self-pay | Admitting: *Deleted

## 2015-09-24 ENCOUNTER — Encounter
Admission: EM | Disposition: A | Discharge status: Home / Self Care | Payer: Self-pay | Source: Home / Self Care | Attending: Internal Medicine

## 2015-09-24 ENCOUNTER — Inpatient Hospital Stay: Payer: Medicare Other

## 2015-09-24 LAB — AMMONIA: Ammonia: 19 umol/L (ref 9–35)

## 2015-09-24 LAB — PROTIME-INR
INR: 1.43
INR: 1.37
Prothrombin Time: 17.5 seconds — ABNORMAL HIGH (ref 11.4–15.0)
Prothrombin Time: 17 seconds — ABNORMAL HIGH (ref 11.4–15.0)

## 2015-09-24 LAB — GLUCOSE, CAPILLARY
Glucose-Capillary: 107 mg/dL — ABNORMAL HIGH (ref 65–99)
Glucose-Capillary: 112 mg/dL — ABNORMAL HIGH (ref 65–99)
Glucose-Capillary: 115 mg/dL — ABNORMAL HIGH (ref 65–99)
Glucose-Capillary: 123 mg/dL — ABNORMAL HIGH (ref 65–99)
Glucose-Capillary: 132 mg/dL — ABNORMAL HIGH (ref 65–99)
Glucose-Capillary: 190 mg/dL — ABNORMAL HIGH (ref 65–99)

## 2015-09-24 LAB — CBC
HCT: 26 % — ABNORMAL LOW (ref 35.0–47.0)
Hemoglobin: 9 g/dL — ABNORMAL LOW (ref 12.0–16.0)
MCH: 31.8 pg (ref 26.0–34.0)
MCHC: 34.4 g/dL (ref 32.0–36.0)
MCV: 92.5 fL (ref 80.0–100.0)
Platelets: 110 10*3/uL — ABNORMAL LOW (ref 150–440)
RBC: 2.82 MIL/uL — ABNORMAL LOW (ref 3.80–5.20)
RDW: 18.4 % — ABNORMAL HIGH (ref 11.5–14.5)
WBC: 5.8 10*3/uL (ref 3.6–11.0)

## 2015-09-24 LAB — TYPE AND SCREEN
ABO/RH(D): O POS
Antibody Screen: NEGATIVE
Unit division: 0
Unit division: 0
Unit division: 0
Unit division: 0

## 2015-09-24 LAB — ALBUMIN, FLUID (OTHER): Albumin, Fluid: 1.4 g/dL

## 2015-09-24 LAB — URINALYSIS COMPLETE WITH MICROSCOPIC (ARMC ONLY)
Bacteria, UA: NONE SEEN
Bilirubin Urine: NEGATIVE
Glucose, UA: NEGATIVE mg/dL
Hgb urine dipstick: NEGATIVE
Ketones, ur: NEGATIVE mg/dL
Leukocytes, UA: NEGATIVE
Nitrite: NEGATIVE
Protein, ur: NEGATIVE mg/dL
RBC / HPF: NONE SEEN RBC/hpf (ref 0–5)
Specific Gravity, Urine: 1.003 — ABNORMAL LOW (ref 1.005–1.030)
pH: 6 (ref 5.0–8.0)

## 2015-09-24 LAB — BODY FLUID CELL COUNT WITH DIFFERENTIAL
Eos, Fluid: 1 %
Lymphs, Fluid: 65 %
Monocyte-Macrophage-Serous Fluid: 16 %
Neutrophil Count, Fluid: 18 %
Total Nucleated Cell Count, Fluid: 176 cu mm

## 2015-09-24 LAB — GRAM STAIN

## 2015-09-24 LAB — PROTEIN, BODY FLUID: Total protein, fluid: <3 g/dL

## 2015-09-24 LAB — HEMOGLOBIN: Hemoglobin: 9.1 g/dL — ABNORMAL LOW (ref 12.0–16.0)

## 2015-09-24 SURGERY — COLONOSCOPY
Anesthesia: General

## 2015-09-24 MED ORDER — VITAMIN K1 10 MG/ML IJ SOLN
10.0000 mg | Freq: Once | INTRAMUSCULAR | Status: AC
  Administered 2015-09-24: 10 mg via SUBCUTANEOUS
  Filled 2015-09-24: qty 1

## 2015-09-24 MED ORDER — OXYCODONE HCL 5 MG PO TABS
20.0000 mg | ORAL_TABLET | ORAL | Status: DC | PRN
  Administered 2015-09-24 – 2015-09-26 (×10): 20 mg via ORAL
  Filled 2015-09-24 (×10): qty 4

## 2015-09-24 MED ORDER — ALBUMIN HUMAN 25 % IV SOLN
12.5000 g | Freq: Once | INTRAVENOUS | Status: AC
Start: 2015-09-24 — End: 2015-09-24
  Administered 2015-09-24: 12.5 g via INTRAVENOUS
  Filled 2015-09-24: qty 50

## 2015-09-24 MED ORDER — MAGNESIUM CITRATE PO SOLN
1.0000 | Freq: Once | ORAL | Status: AC
  Administered 2015-09-25: 1 via ORAL
  Filled 2015-09-24: qty 296

## 2015-09-24 MED ORDER — CIPROFLOXACIN HCL 500 MG PO TABS
500.0000 mg | ORAL_TABLET | ORAL | Status: DC
  Administered 2015-09-25: 500 mg via ORAL
  Filled 2015-09-24: qty 1

## 2015-09-24 NOTE — Procedures (Signed)
Under US guidance, paracentesis was performed without complication.

## 2015-09-24 NOTE — Progress Notes (Signed)
[facility] [place] NAME: Lynora Dymond    MR#:  [id number]  DATE OF BIRTH:  05/04/1960  SUBJECTIVE:  CHIEF COMPLAINT:   Chief Complaint  Patient presents with  . GI Bleeding  . Abnormal Lab  colonoscopy post-poned so not very happy, Successful ultrasound-guided paracentesis yielding 5.9 liters of peritoneal fluid. REVIEW OF SYSTEMS:  Review of Systems  Constitutional: Negative for fever, weight loss, malaise/fatigue and diaphoresis.  HENT: Negative for ear discharge, ear pain, hearing loss, nosebleeds, sore throat and tinnitus.   Eyes: Negative for blurred vision and pain.  Respiratory: Negative for cough, hemoptysis, shortness of breath and wheezing.   Cardiovascular: Negative for chest pain, palpitations, orthopnea and leg swelling.  Gastrointestinal: Negative for heartburn, nausea, vomiting, abdominal pain, diarrhea, constipation and blood in stool.  Genitourinary: Negative for dysuria, urgency and frequency.  Musculoskeletal: Negative for myalgias and back pain.  Skin: Negative for itching and rash.  Neurological: Positive for dizziness. Negative for tingling, tremors, focal weakness, seizures, weakness and headaches.  Psychiatric/Behavioral: Negative for depression. The patient is not nervous/anxious.    DRUG ALLERGIES:  No Known Allergies VITALS:  Blood pressure 92/49, pulse 64, temperature 97.5 F (36.4 C), temperature source Oral, resp. rate 16, height '5\' 6"'$  (1.676 m), weight 87.816 kg (193 lb 9.6 oz), SpO2 100 %. PHYSICAL EXAMINATION:  Physical Exam  Constitutional: She is oriented to person, place, and time and well-developed, well-nourished, and in no distress.  HENT:  Head: Normocephalic and atraumatic.  Eyes: Conjunctivae and EOM are normal. Pupils are equal, round, and reactive to light.  Neck: Normal range of motion. Neck supple. No tracheal deviation present. No thyromegaly present.  Cardiovascular: Normal rate,  regular rhythm and normal heart sounds.   Pulmonary/Chest: Effort normal and breath sounds normal. No respiratory distress. She has no wheezes. She exhibits no tenderness.  Abdominal: Soft. Bowel sounds are normal. She exhibits no distension. There is no tenderness.  Musculoskeletal: Normal range of motion.  Neurological: She is alert and oriented to person, place, and time. No cranial nerve deficit.  Skin: Skin is warm and dry. No rash noted.  Psychiatric: Mood and affect normal.   LABORATORY PANEL:   CBC  Recent Labs Lab 09/24/15 0352  WBC 5.8  HGB 9.0*  HCT 26.0*  PLT 110*   ------------------------------------------------------------------------------------------------------------------ Chemistries   Recent Labs Lab 09/22/15 1458 09/23/15 0324  NA 129* 133*  K 3.5 3.9  CL 99* 103  CO2 22 24  GLUCOSE 179* 151*  BUN 12 9  CREATININE 0.59 0.53  CALCIUM 8.2* 7.8*  AST 33  --   ALT 19  --   ALKPHOS 76  --   BILITOT 0.5  --    RADIOLOGY:  US Paracentesis  09/24/2015  INDICATION: Ascites EXAM: ULTRASOUND GUIDED therapeutic and diagnostic PARACENTESIS MEDICATIONS: None. COMPLICATIONS: None immediate. PROCEDURE: Informed written consent was obtained from the patient after a discussion of the risks, benefits and alternatives to treatment. A timeout was performed prior to the initiation of the procedure. Initial ultrasound scanning demonstrates a large amount of ascites within the right lower abdominal quadrant. The right lower abdomen was prepped and draped in the usual sterile fashion. 1% lidocaine with epinephrine was used for local anesthesia. Following this, a Safe-T-Centesis catheter was introduced. An ultrasound image was saved for documentation purposes. The paracentesis was performed. The catheter was removed and a dressing was applied. The patient tolerated the procedure well without immediate post procedural complication.  FINDINGS: A total of approximately 5.9 L of  serous fluid was removed. Samples were sent to the laboratory as requested by the clinical team. IMPRESSION: Successful ultrasound-guided paracentesis yielding 5.9 liters of peritoneal fluid. Electronically Signed   By: Marijo Conception, M.D.   On: 09/24/2015 10:50   ASSESSMENT AND PLAN:  * Acute blood loss anemia due to GI bleed - Continue Protonix twice a day. - Recent EGD showed erosive gastritis. No significant ulcers. - GI planning for colonoscopy tomorrow - Transfused total 4units packed RBC on admission and Hb upto 9 this am - C-scope tomorrow  * Cirrhosis with ascites Continue Lasix and Aldactone. - Successful ultrasound-guided paracentesis yielding 5.9 liters of peritoneal fluid. Patient is supposed to start her treatment for hepatitis C in August. Later will be on [facility] list at [facility].  * Chronic pain syndrome Continue home medications with oxycodone. Made this more frequent at patient's request due to recent fall.  * Chronic mild hypervolemic hyponatremia: Na improved from 129->133  * DVT prophylaxis with SCDs   Discontinue tele  All the records are reviewed and case discussed with Care Management/Social Worker. Management plans discussed with the patient, family and they are in agreement.  CODE STATUS: FULL CODE  TOTAL TIME TAKING CARE OF THIS PATIENT: 35 minutes.   More than 50% of the time was spent in counseling/coordination of care: YES  POSSIBLE D/C IN 1-2 DAYS, DEPENDING ON CLINICAL CONDITION. And GI eval   [facility], Glennice Marcos M.D on 09/24/2015 at 4:55 PM  Between 7am to 6pm - Pager - [phone]  After 6pm go to [email or web address] - Proofreader  [employer] [facility]  Office  [phone]  CC: Primary care physician; Volanda Napoleon, MD  Note: This dictation was prepared with Dragon dictation along with smaller phrase technology. Any transcriptional errors that result from this process are unintentional.

## 2015-09-24 NOTE — Consult Note (Signed)
Subjective: Patient seen for GI blood loss.  Patient tolerated prep for colonoscopy.  No evidence of ongoing bleeding.  No n/v or abdominal pain.  Had paracentesis this am with removal of 5.9 l fluid.  Labs pending.  No abdominal pain.  No n/v.    Objective: Vital signs in last 24 hours: Temp:  [97.5 F (36.4 C)-99 F (37.2 C)] 97.5 F (36.4 C) (06/28 1505) Pulse Rate:  [61-72] 64 (06/28 1505) Resp:  [16-20] 16 (06/28 1505) BP: (82-130)/(43-64) 92/49 mmHg (06/28 1505) SpO2:  [94 %-100 %] 100 % (06/28 1505) Weight:  [87.816 kg (193 lb 9.6 oz)] 87.816 kg (193 lb 9.6 oz) (06/28 0500) Blood pressure 92/49, pulse 64, temperature 97.5 F (36.4 C), temperature source Oral, resp. rate 16, height '5\' 6"'$  (1.676 m), weight 87.816 kg (193 lb 9.6 oz), SpO2 100 %.   Intake/Output from previous day: 06/27 0701 - 06/28 0700 In: 1727 [P.O.:1320; Blood:357; IV Piggyback:50] Out: 1000 [Urine:1000]  Intake/Output this shift: Total I/O In: 240 [P.O.:240] Out: 0    General appearance:  55 f no distress, easily tearful.  Resp:  bcta Cardio:  rrr GI:  Soft nd/nt bowel sounds positive  Extremities:  1 + lee, left > right   Lab Results: Results for orders placed or performed during the hospital encounter of 09/22/15 (from the past 24 hour(s))  Glucose, capillary     Status: Abnormal   Collection Time: 09/23/15  8:25 PM  Result Value Ref Range   Glucose-Capillary 107 (H) 65 - 99 mg/dL   Comment 1 Notify RN   Glucose, capillary     Status: Abnormal   Collection Time: 09/23/15 11:50 PM  Result Value Ref Range   Glucose-Capillary 120 (H) 65 - 99 mg/dL   Comment 1 Notify RN   Hemoglobin     Status: Abnormal   Collection Time: 09/24/15 12:12 AM  Result Value Ref Range   Hemoglobin 9.1 (L) 12.0 - 16.0 g/dL  Glucose, capillary     Status: Abnormal   Collection Time: 09/24/15  3:47 AM  Result Value Ref Range   Glucose-Capillary 132 (H) 65 - 99 mg/dL   Comment 1 Notify RN   CBC     Status:  Abnormal   Collection Time: 09/24/15  3:52 AM  Result Value Ref Range   WBC 5.8 3.6 - 11.0 K/uL   RBC 2.82 (L) 3.80 - 5.20 MIL/uL   Hemoglobin 9.0 (L) 12.0 - 16.0 g/dL   HCT 26.0 (L) 35.0 - 47.0 %   MCV 92.5 80.0 - 100.0 fL   MCH 31.8 26.0 - 34.0 pg   MCHC 34.4 32.0 - 36.0 g/dL   RDW 18.4 (H) 11.5 - 14.5 %   Platelets 110 (L) 150 - 440 K/uL  Protime-INR     Status: Abnormal   Collection Time: 09/24/15  3:52 AM  Result Value Ref Range   Prothrombin Time 17.5 (H) 11.4 - 15.0 seconds   INR 1.43   Ammonia     Status: None   Collection Time: 09/24/15  7:22 AM  Result Value Ref Range   Ammonia 19 9 - 35 umol/L  Glucose, capillary     Status: Abnormal   Collection Time: 09/24/15  7:41 AM  Result Value Ref Range   Glucose-Capillary 107 (H) 65 - 99 mg/dL   Comment 1 Notify RN   Albumin, pleural or peritoneal fluid     Status: None   Collection Time: 09/24/15  9:19 AM  Result Value  Ref Range   Albumin, Fluid 1.4 g/dL   Fluid Type-FALB PERITONEAL   Protein, pleural or peritoneal fluid     Status: None   Collection Time: 09/24/15  9:19 AM  Result Value Ref Range   Total protein, fluid <3.0 g/dL   Fluid Type-FTP CYTOPERI   Body fluid cell count with differential     Status: Abnormal   Collection Time: 09/24/15  9:19 AM  Result Value Ref Range   Fluid Type-FCT PERITONEAL    Color, Fluid YELLOW YELLOW   Appearance, Fluid CLEAR (A) CLEAR   WBC, Fluid 176 cu mm   Neutrophil Count, Fluid 18 %   Lymphs, Fluid 65 %   Monocyte-Macrophage-Serous Fluid 16 %   Eos, Fluid 1 %  Gram stain     Status: None   Collection Time: 09/24/15  9:19 AM  Result Value Ref Range   Specimen Description FLUID PERITONEAL    Special Requests NONE    Gram Stain      FEW WBC PRESENT,BOTH PMN AND MONONUCLEAR NO ORGANISMS SEEN Performed at [facility]    Report Status 09/24/2015 FINAL   Glucose, capillary     Status: Abnormal   Collection Time: 09/24/15 11:27 AM  Result Value Ref Range    Glucose-Capillary 115 (H) 65 - 99 mg/dL   Comment 1 Notify RN   Protime-INR     Status: Abnormal   Collection Time: 09/24/15 12:22 PM  Result Value Ref Range   Prothrombin Time 17.0 (H) 11.4 - 15.0 seconds   INR 1.37   Glucose, capillary     Status: Abnormal   Collection Time: 09/24/15  4:22 PM  Result Value Ref Range   Glucose-Capillary 190 (H) 65 - 99 mg/dL   Comment 1 Notify RN       Recent Labs  09/22/15 1458 09/23/15 0324 09/24/15 0012 09/24/15 0352  WBC 6.8 5.1  --  5.8  HGB 5.7* 6.9* 9.1* 9.0*  HCT 16.2* 19.9*  --  26.0*  PLT 151 114*  --  110*   BMET  Recent Labs  09/22/15 1458 09/23/15 0324  NA 129* 133*  K 3.5 3.9  CL 99* 103  CO2 22 24  GLUCOSE 179* 151*  BUN 12 9  CREATININE 0.59 0.53  CALCIUM 8.2* 7.8*   LFT  Recent Labs  09/22/15 1458  PROT 6.4*  ALBUMIN 3.0*  AST 33  ALT 19  ALKPHOS 76  BILITOT 0.5   PT/INR  Recent Labs  09/24/15 0352 09/24/15 1222  LABPROT 17.5* 17.0*  INR 1.43 1.37   Hepatitis Panel No results for input(s): HEPBSAG, HCVAB, HEPAIGM, HEPBIGM in the last 72 hours. C-Diff No results for input(s): CDIFFTOX in the last 72 hours. No results for input(s): CDIFFPCR in the last 72 hours.   Studies/Results: US Paracentesis  09/24/2015  INDICATION: Ascites EXAM: ULTRASOUND GUIDED therapeutic and diagnostic PARACENTESIS MEDICATIONS: None. COMPLICATIONS: None immediate. PROCEDURE: Informed written consent was obtained from the patient after a discussion of the risks, benefits and alternatives to treatment. A timeout was performed prior to the initiation of the procedure. Initial ultrasound scanning demonstrates a large amount of ascites within the right lower abdominal quadrant. The right lower abdomen was prepped and draped in the usual sterile fashion. 1% lidocaine with epinephrine was used for local anesthesia. Following this, a Safe-T-Centesis catheter was introduced. An ultrasound image was saved for documentation  purposes. The paracentesis was performed. The catheter was removed and a dressing was applied. The patient  tolerated the procedure well without immediate post procedural complication. FINDINGS: A total of approximately 5.9 L of serous fluid was removed. Samples were sent to the laboratory as requested by the clinical team. IMPRESSION: Successful ultrasound-guided paracentesis yielding 5.9 liters of peritoneal fluid. Electronically Signed   By: Marijo Conception, M.D.   On: 09/24/2015 10:50    Scheduled Inpatient Medications:   . sodium chloride   Intravenous Once  . [START ON 09/25/2015] ciprofloxacin  500 mg Oral Once per day on Mon Thu  . citalopram  20 mg Oral Daily  . furosemide  40 mg Oral BID  . gabapentin  300 mg Oral TID  . insulin aspart  0-9 Units Subcutaneous Q4H  . lactulose  10 g Oral TID  . [START ON 09/25/2015] magnesium citrate  1 Bottle Oral Once  . magnesium oxide  400 mg Oral Daily  . pantoprazole  40 mg Oral BID  . sodium chloride flush  3 mL Intravenous Q12H  . sodium chloride flush  3 mL Intravenous Q12H  . spironolactone  100 mg Oral Daily  . 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  Ammonia normal level.   Plan:  1) colonoscopy changed from today to tomorrow due to sedation risk of hypotension.  Patient had episode of hypotension in the paracentesis, and I ordered some albumen this afternoon.  Continue current.  I have discussed the risks benefits and complications of procedures to include not limited to bleeding, infection, perforation and the risk of sedation and the patient wishes to proceed.I will also repeat the vitamin K tomorrow am, recheck coags in am.   2) chronic back pain 3) hepatitis C-to begin treatment once 6 months abstinence from ETOH.   4) h/o etoh abuse, abstinence for 4 months.  Lollie Sails MD 09/24/2015, 5:52 PM

## 2015-09-25 ENCOUNTER — Inpatient Hospital Stay: Payer: Medicare Other | Admitting: Anesthesiology

## 2015-09-25 ENCOUNTER — Encounter: Payer: Self-pay | Admitting: Anesthesiology

## 2015-09-25 ENCOUNTER — Inpatient Hospital Stay: Payer: Medicare Other

## 2015-09-25 ENCOUNTER — Encounter
Admission: EM | Disposition: A | Discharge status: Home / Self Care | Payer: Self-pay | Source: Home / Self Care | Attending: Internal Medicine

## 2015-09-25 HISTORY — PX: COLONOSCOPY WITH PROPOFOL: SHX5780

## 2015-09-25 LAB — BASIC METABOLIC PANEL
Anion gap: 5 (ref 5–15)
BUN: <5 mg/dL — ABNORMAL LOW (ref 6–20)
CO2: 24 mmol/L (ref 22–32)
Calcium: 7.5 mg/dL — ABNORMAL LOW (ref 8.9–10.3)
Chloride: 105 mmol/L (ref 101–111)
Creatinine, Ser: 0.65 mg/dL (ref 0.44–1.00)
GFR calc Af Amer: >60 mL/min (ref >60)
GFR calc non Af Amer: >60 mL/min (ref >60)
Glucose, Bld: 112 mg/dL — ABNORMAL HIGH (ref 65–99)
Potassium: 3.4 mmol/L — ABNORMAL LOW (ref 3.5–5.1)
Sodium: 134 mmol/L — ABNORMAL LOW (ref 135–145)

## 2015-09-25 LAB — CYTOLOGY - NON PAP

## 2015-09-25 LAB — GLUCOSE, CAPILLARY
Glucose-Capillary: 116 mg/dL — ABNORMAL HIGH (ref 65–99)
Glucose-Capillary: 115 mg/dL — ABNORMAL HIGH (ref 65–99)
Glucose-Capillary: 128 mg/dL — ABNORMAL HIGH (ref 65–99)
Glucose-Capillary: 103 mg/dL — ABNORMAL HIGH (ref 65–99)
Glucose-Capillary: 213 mg/dL — ABNORMAL HIGH (ref 65–99)

## 2015-09-25 LAB — CBC
HCT: 26.6 % — ABNORMAL LOW (ref 35.0–47.0)
Hemoglobin: 9.1 g/dL — ABNORMAL LOW (ref 12.0–16.0)
MCH: 32.2 pg (ref 26.0–34.0)
MCHC: 34.2 g/dL (ref 32.0–36.0)
MCV: 94.1 fL (ref 80.0–100.0)
Platelets: 105 10*3/uL — ABNORMAL LOW (ref 150–440)
RBC: 2.82 MIL/uL — ABNORMAL LOW (ref 3.80–5.20)
RDW: 18.1 % — ABNORMAL HIGH (ref 11.5–14.5)
WBC: 6.5 10*3/uL (ref 3.6–11.0)

## 2015-09-25 LAB — PROTIME-INR
INR: 1.43
Prothrombin Time: 17.5 seconds — ABNORMAL HIGH (ref 11.4–15.0)

## 2015-09-25 SURGERY — COLONOSCOPY WITH PROPOFOL
Anesthesia: General

## 2015-09-25 MED ORDER — FENTANYL CITRATE (PF) 100 MCG/2ML IJ SOLN
INTRAMUSCULAR | Status: DC | PRN
  Administered 2015-09-25: 50 ug via INTRAVENOUS

## 2015-09-25 MED ORDER — POTASSIUM CHLORIDE 10 MEQ/100ML IV SOLN
10.0000 meq | INTRAVENOUS | Status: AC
  Administered 2015-09-25 (×2): 10 meq via INTRAVENOUS
  Filled 2015-09-25 (×2): qty 100

## 2015-09-25 MED ORDER — GLYCOPYRROLATE 0.2 MG/ML IJ SOLN
INTRAMUSCULAR | Status: DC | PRN
  Administered 2015-09-25: .2 mg via INTRAVENOUS

## 2015-09-25 MED ORDER — VITAMIN K1 10 MG/ML IJ SOLN
10.0000 mg | Freq: Once | INTRAMUSCULAR | Status: AC
  Administered 2015-09-25: 10 mg via SUBCUTANEOUS
  Filled 2015-09-25 (×2): qty 1

## 2015-09-25 MED ORDER — PROPOFOL 500 MG/50ML IV EMUL
INTRAVENOUS | Status: DC | PRN
  Administered 2015-09-25: 140 ug/kg/min via INTRAVENOUS

## 2015-09-25 MED ORDER — PHENYLEPHRINE HCL 10 MG/ML IJ SOLN
INTRAMUSCULAR | Status: DC | PRN
  Administered 2015-09-25 (×3): 100 ug via INTRAVENOUS

## 2015-09-25 MED ORDER — EPHEDRINE SULFATE 50 MG/ML IJ SOLN
INTRAMUSCULAR | Status: DC | PRN
  Administered 2015-09-25 (×2): 10 mg via INTRAVENOUS

## 2015-09-25 MED ORDER — FUROSEMIDE 20 MG PO TABS
20.0000 mg | ORAL_TABLET | Freq: Two times a day (BID) | ORAL | Status: DC
  Administered 2015-09-25 – 2015-09-26 (×2): 20 mg via ORAL
  Filled 2015-09-25 (×2): qty 1

## 2015-09-25 MED ORDER — INSULIN ASPART 100 UNIT/ML ~~LOC~~ SOLN
0.0000 [IU] | Freq: Three times a day (TID) | SUBCUTANEOUS | Status: DC
  Filled 2015-09-25 (×19): qty 0.09

## 2015-09-25 MED ORDER — SPIRONOLACTONE 25 MG PO TABS
50.0000 mg | ORAL_TABLET | Freq: Every day | ORAL | Status: DC
  Administered 2015-09-26: 50 mg via ORAL
  Filled 2015-09-25: qty 2

## 2015-09-25 MED ORDER — LIDOCAINE 2% (20 MG/ML) 5 ML SYRINGE
INTRAMUSCULAR | Status: DC | PRN
  Administered 2015-09-25: 40 mg via INTRAVENOUS

## 2015-09-25 MED ORDER — SODIUM CHLORIDE 0.9 % IV SOLN: INTRAVENOUS | Status: DC

## 2015-09-25 MED ORDER — PREDNISONE 20 MG PO TABS
20.0000 mg | ORAL_TABLET | Freq: Once | ORAL | Status: AC
  Administered 2015-09-25: 20 mg via ORAL
  Filled 2015-09-25: qty 1

## 2015-09-25 MED ORDER — PROPOFOL 10 MG/ML IV BOLUS
INTRAVENOUS | Status: DC | PRN
  Administered 2015-09-25: 30 mg via INTRAVENOUS
  Administered 2015-09-25: 100 mg via INTRAVENOUS

## 2015-09-25 MED ORDER — MIDAZOLAM HCL 5 MG/5ML IJ SOLN
INTRAMUSCULAR | Status: DC | PRN
  Administered 2015-09-25: 1 mg via INTRAVENOUS

## 2015-09-25 MED ORDER — SODIUM CHLORIDE 0.9 % IV SOLN
INTRAVENOUS | Status: DC
Start: 2015-09-25 — End: 2015-09-25
  Administered 2015-09-25: 1000 mL via INTRAVENOUS

## 2015-09-25 MED ORDER — INSULIN ASPART 100 UNIT/ML ~~LOC~~ SOLN
0.0000 [IU] | Freq: Every day | SUBCUTANEOUS | Status: DC
  Administered 2015-09-25: 2 [IU] via SUBCUTANEOUS
  Filled 2015-09-25 (×3): qty 0.05
  Filled 2015-09-25: qty 2
  Filled 2015-09-25 (×4): qty 0.05

## 2015-09-25 NOTE — Transfer of Care (Signed)
Immediate Anesthesia Transfer of Care Note  Patient: Audrey Garrett  Procedure(s) Performed: Procedure(s): COLONOSCOPY WITH PROPOFOL (N/A)  Patient Location: PACU  Anesthesia Type:General  Level of Consciousness: awake  Airway & Oxygen Therapy: Patient Spontanous Breathing and Patient connected to nasal cannula oxygen  Post-op Assessment: Report given to RN and Post -op Vital signs reviewed and stable  Post vital signs: Reviewed and stable  Last Vitals:  Filed Vitals:   09/25/15 0824 09/25/15 1402  BP: 92/58 99/55  Pulse:  66  Temp:  37.1 C  Resp:      Last Pain:  Filed Vitals:   09/25/15 1558  PainSc: 2       Patients Stated Pain Goal: 3 (44/92/01 0071)  Complications: No apparent anesthesia complications

## 2015-09-25 NOTE — Progress Notes (Signed)
Pt cannot tolerate IV potassium at prescribed rate. Currently rate is at 25 will continue to monitor.

## 2015-09-25 NOTE — Care Management Important Message (Signed)
Important Message  Patient Details  Name: Audrey Garrett MRN: [id number] Date of Birth: [birth year]   Medicare Important Message Given:  Yes    Juliann Pulse A Jakevious Hollister 09/25/2015, 10:53 AM

## 2015-09-25 NOTE — Op Note (Addendum)
[facility] Patient Name: Audrey Garrett Procedure Date: 09/25/2015 2:46 PM MRN: [id number] Account #: [id number] Date of Birth: [birth year] Admit Type: Inpatient Age: 55 Room: [facility] ENDO ROOM 1 Gender: Female Note Status: Supervisor Override Procedure:            Colonoscopy Indications:          Gastrointestinal bleeding Providers:            Lollie Sails, MD Referring MD:         Venetia Maxon. Elijio Miles, MD (Referring MD) Medicines:            Monitored Anesthesia Care Complications:        No immediate complications. Procedure:            Pre-Anesthesia Assessment:                       - ASA Grade Assessment: III - A patient with severe                        systemic disease.                       After obtaining informed consent, the colonoscope was                        passed under direct vision. Throughout the procedure,                        the patient's blood pressure, pulse, and oxygen                        saturations were monitored continuously. The                        Colonoscope was introduced through the anus and                        advanced to the the cecum, identified by appendiceal                        orifice and ileocecal valve. The colonoscopy was                        performed with moderate difficulty, patient inability                        to hold insufflation. The quality of the bowel                        preparation was fair. The patient tolerated the                        procedure well. Findings:      A 10 mm polyp was found in the descending colon at about 60 cm from the       anal verge. The polyp was pedunculated. The polyp was removed with a hot       snare. Resection and retrieval were complete. Retrieval with a forcep.       To prevent bleeding after the polypectomy, one hemostatic clip was       successfully placed. There was no  bleeding during, or at the end, of the       procedure.  Multiple small-mouthed diverticula were found in the sigmoid colon and       descending colon.      The digital rectal exam was normal. Impression:           - Preparation of the colon was fair.                       - One 10 mm polyp in the descending colon, removed with                        a hot snare. Resected and retrieved. Clip was placed.                       - Diverticulosis in the sigmoid colon and in the                        descending colon. Recommendation:       - Return patient to hospital ward for ongoing care.                       - Clear liquid diet today.                       - Full liquid diet for 2 days, then advance as                        tolerated to low residue diet for 5 days.                       - Await pathology results. Procedure Code(s):    --- Professional ---                       [phone], Colonoscopy, flexible; with removal of tumor(s),                        polyp(s), or other lesion(s) by snare technique Diagnosis Code(s):    --- Professional ---                       D12.4, Benign neoplasm of descending colon                       K92.2, Gastrointestinal hemorrhage, unspecified                       K57.30, Diverticulosis of large intestine without                        perforation or abscess without bleeding CPT copyright 2016 American [employer]. All rights reserved. The codes documented in this report are preliminary and upon coder review may  be revised to meet current compliance requirements. Lollie Sails, MD 09/25/2015 4:01:24 PM This report has been signed electronically. Number of Addenda: 0 Note Initiated On: 09/25/2015 2:46 PM Scope Withdrawal Time: 0 hours 30 minutes 44 seconds  Total Procedure Duration: 0 hours 51 minutes 13 seconds       [facility]

## 2015-09-25 NOTE — Progress Notes (Signed)
Patient is A&O x4, up with SBA, steady gait. Colonoscopy today. Blood sugars 125, 128, 103, sliding scale coverage as needed. Husband at bedside. Chronic back pain, given PRN oral pain meds with good relief. BPs run low, MD aware. Manual cuff preferred. Started clear liquids for dinner.

## 2015-09-25 NOTE — Consult Note (Signed)
Please see colonoscopy report.  No active bleeding or melena noted.  One polyp removed with cautery and clip placed.    Continue bid ppi and carafate,  Vitamin k 5 mg po three times a week.  Observe through tomorrow. AM labs.

## 2015-09-25 NOTE — Progress Notes (Signed)
[name] BP 92/56, INR 1.43. Orders for Lasix '40mg'$  and Vitamin K '10mg'$  SQ, now. Called MD to review orders. MD to change order for dose of Lasix and give Vit K.

## 2015-09-25 NOTE — Progress Notes (Signed)
Patient ID: Audrey Garrett, female   DOB: [birth year], 55 y.o.   MRN: [id number] [employer] PROGRESS NOTE  Audrey Garrett AYT:[id number] DOB: 12-Dec-1960 DOA: 09/22/2015 PCP: Volanda Napoleon, MD  HPI/Subjective: Patient seen earlier and states she is hungry. Her major complaint is back pain. No further bleeding.  Objective: Filed Vitals:   09/25/15 1556 09/25/15 1617  BP: 90/51 90/52  Pulse: 94 86  Temp: 96.6 F (35.9 C)   Resp: 13 14    Filed Weights   09/23/15 0407 09/24/15 0500 09/25/15 0500  Weight: 79.561 kg (175 lb 6.4 oz) 87.816 kg (193 lb 9.6 oz) 78.227 kg (172 lb 7.4 oz)    ROS: Review of Systems  Constitutional: Negative for fever and chills.  Eyes: Negative for blurred vision.  Respiratory: Negative for cough and shortness of breath.   Cardiovascular: Negative for chest pain.  Gastrointestinal: Negative for nausea, vomiting, abdominal pain, diarrhea and constipation.  Genitourinary: Negative for dysuria.  Musculoskeletal: Positive for back pain. Negative for joint pain.  Neurological: Negative for dizziness and headaches.   Exam: Physical Exam  Constitutional: She is oriented to person, place, and time.  HENT:  Nose: No mucosal edema.  Mouth/Throat: No oropharyngeal exudate or posterior oropharyngeal edema.  Eyes: Conjunctivae, EOM and lids are normal. Pupils are equal, round, and reactive to light.  Neck: No JVD present. Carotid bruit is not present. No edema present. No thyroid mass and no thyromegaly present.  Cardiovascular: S1 normal and S2 normal.  Exam reveals no gallop.   No murmur heard. Pulses:      Dorsalis pedis pulses are 2+ on the right side, and 2+ on the left side.  Respiratory: No respiratory distress. She has no wheezes. She has no rhonchi. She has no rales.  GI: Soft. Bowel sounds are normal. There is no tenderness.  Musculoskeletal:       Right ankle: She exhibits no swelling.       Left ankle: She exhibits no swelling.   Lymphadenopathy:    She has no cervical adenopathy.  Neurological: She is alert and oriented to person, place, and time. No cranial nerve deficit.  Able to straight leg raise. No pain to palpation over the back.  Skin: Skin is warm. No rash noted. Nails show no clubbing.  Psychiatric: She has a normal mood and affect.      Data Reviewed: Basic Metabolic Panel:  Recent Labs Lab 09/22/15 1458 09/23/15 0324 09/25/15 0315  NA 129* 133* 134*  K 3.5 3.9 3.4*  CL 99* 103 105  CO2 '22 24 24  '$ GLUCOSE 179* 151* 112*  BUN 12 9 <5*  CREATININE 0.59 0.53 0.65  CALCIUM 8.2* 7.8* 7.5*   Liver Function Tests:  Recent Labs Lab 09/22/15 1458  AST 33  ALT 19  ALKPHOS 76  BILITOT 0.5  PROT 6.4*  ALBUMIN 3.0*    Recent Labs Lab 09/24/15 0722  AMMONIA 19   CBC:  Recent Labs Lab 09/22/15 1458 09/23/15 0324 09/24/15 0012 09/24/15 0352 09/25/15 0315  WBC 6.8 5.1  --  5.8 6.5  HGB 5.7* 6.9* 9.1* 9.0* 9.1*  HCT 16.2* 19.9*  --  26.0* 26.6*  MCV 100.3* 94.5  --  92.5 94.1  PLT 151 114*  --  110* 105*    CBG:  Recent Labs Lab 09/24/15 2132 09/24/15 2344 09/25/15 0402 09/25/15 0808 09/25/15 1209  GLUCAP 112* 123* 116* 115* 128*    Recent Results (from the past 240 hour(s))  Culture, body  fluid-bottle     Status: None (Preliminary result)   Collection Time: 09/24/15  9:19 AM  Result Value Ref Range Status   Specimen Description FLUID PERITONEAL  Final   Special Requests NONE  Final   Culture   Final    NO GROWTH < 24 HOURS Performed at [facility]    Report Status PENDING  Incomplete  Gram stain     Status: None   Collection Time: 09/24/15  9:19 AM  Result Value Ref Range Status   Specimen Description FLUID PERITONEAL  Final   Special Requests NONE  Final   Gram Stain   Final    FEW WBC PRESENT,BOTH PMN AND MONONUCLEAR NO ORGANISMS SEEN Performed at [facility]    Report Status 09/24/2015 FINAL  Final     Studies: US  Paracentesis  09/24/2015  INDICATION: Ascites EXAM: ULTRASOUND GUIDED therapeutic and diagnostic PARACENTESIS MEDICATIONS: None. COMPLICATIONS: None immediate. PROCEDURE: Informed written consent was obtained from the patient after a discussion of the risks, benefits and alternatives to treatment. A timeout was performed prior to the initiation of the procedure. Initial ultrasound scanning demonstrates a large amount of ascites within the right lower abdominal quadrant. The right lower abdomen was prepped and draped in the usual sterile fashion. 1% lidocaine with epinephrine was used for local anesthesia. Following this, a Safe-T-Centesis catheter was introduced. An ultrasound image was saved for documentation purposes. The paracentesis was performed. The catheter was removed and a dressing was applied. The patient tolerated the procedure well without immediate post procedural complication. FINDINGS: A total of approximately 5.9 L of serous fluid was removed. Samples were sent to the laboratory as requested by the clinical team. IMPRESSION: Successful ultrasound-guided paracentesis yielding 5.9 liters of peritoneal fluid. Electronically Signed   By: Marijo Conception, M.D.   On: 09/24/2015 10:50    Scheduled Meds: . [MAR Hold] sodium chloride   Intravenous Once  . [MAR Hold] ciprofloxacin  500 mg Oral Once per day on Mon Thu  . [MAR Hold] citalopram  20 mg Oral Daily  . [MAR Hold] furosemide  20 mg Oral BID  . [MAR Hold] gabapentin  300 mg Oral TID  . insulin aspart  0-5 Units Subcutaneous QHS  . insulin aspart  0-9 Units Subcutaneous TID WC  . [MAR Hold] lactulose  10 g Oral TID  . [MAR Hold] magnesium oxide  400 mg Oral Daily  . [MAR Hold] pantoprazole  40 mg Oral BID  . predniSONE  20 mg Oral Once  . [MAR Hold] sodium chloride flush  3 mL Intravenous Q12H  . [MAR Hold] sodium chloride flush  3 mL Intravenous Q12H  . [MAR Hold] spironolactone  50 mg Oral Daily  . [MAR Hold] sucralfate  1 g Oral  TID WC & HS   Continuous Infusions: . sodium chloride 1,000 mL (09/25/15 1437)  . sodium chloride      Assessment/Plan:  1. Acute blood loss anemia secondary to GI bleed. Patient was transfused total 4 units of packed red blood cells. No active bleeding seen on recent endoscopy or today's colonoscopy. 2. Cirrhosis with ascites and borderline hypotension. History of hepatitis C Decrease Lasix and Aldactone doses. Patient had quite a bit of fluid taken off with paracentesis.  3. Acute back pain. We'll get a CT scan of the lumbar spine. We'll give a dose of prednisone. 4. Hyponatremia. This has improved sodium 134 5. Hypokalemia potassium 3.4. I did give IV potassium today.  Code  Status:     Code Status Orders        Start     Ordered   09/22/15 1651  Full code   Continuous     09/22/15 1651    Code Status History    Date Active Date Inactive Code Status Order ID Comments User Context   05/26/2015  8:18 PM 05/28/2015  5:21 PM Full Code [id number]  Demetrios Loll, MD Inpatient   05/21/2015  2:59 PM 05/22/2015  7:30 PM Full Code [id number]  Gladstone Lighter, MD Inpatient   11/29/2013 12:02 PM 11/30/2013  3:31 AM Full Code [id number]  Logan Bores, MD HOV     Family Communication: Spoke with husband at the bedside Disposition Plan: Home tomorrow  Consultants:  Gastroenterology  Procedures:  Colonoscopy  Antibiotics:  Cipro twice a week  Time spent: 25 minutes  [place], [place]

## 2015-09-25 NOTE — Anesthesia Postprocedure Evaluation (Signed)
Anesthesia Post Note  Patient: Audrey Garrett  Procedure(s) Performed: Procedure(s) (LRB): COLONOSCOPY WITH PROPOFOL (N/A)  Patient location during evaluation: PACU Anesthesia Type: General Level of consciousness: awake and alert Pain management: pain level controlled Vital Signs Assessment: post-procedure vital signs reviewed and stable Respiratory status: spontaneous breathing and respiratory function stable Cardiovascular status: stable Anesthetic complications: no    Last Vitals:  Filed Vitals:   09/25/15 1402 09/25/15 1556  BP: 99/55 90/51  Pulse: 66 94  Temp: 37.1 C 35.9 C  Resp:  13    Last Pain:  Filed Vitals:   09/25/15 1559  PainSc: 2                  KEPHART,WILLIAM K

## 2015-09-25 NOTE — H&P (Signed)
Subjective: Patient seen for GI bleeding. No nausea vomiting or abdominal pain overnight. She did tolerate paracentesis yesterday. She was given a dose of albumin last evening. He is also been given some vitamin K.  Objective: Vital signs in last 24 hours: Temp:  [97.5 F (36.4 C)-98.7 F (37.1 C)] 98.7 F (37.1 C) (06/29 1402) Pulse Rate:  [53-69] 66 (06/29 1402) Resp:  [16] 16 (06/29 0803) BP: (79-99)/(39-58) 99/55 mmHg (06/29 1402) SpO2:  [98 %-100 %] 98 % (06/29 0803) Weight:  [78.227 kg (172 lb 7.4 oz)] 78.227 kg (172 lb 7.4 oz) (06/29 0500) Blood pressure 99/55, pulse 66, temperature 98.7 F (37.1 C), temperature source Oral, resp. rate 16, height '5\' 6"'$  (1.676 m), weight 78.227 kg (172 lb 7.4 oz), SpO2 98 %.   Intake/Output from previous day: 06/28 0701 - 06/29 0700 In: 720 [P.O.:720] Out: 0   Intake/Output this shift:     General appearance:  55 year old female no distress Resp:  Bilaterally clear to auscultation Cardio:  Regular rate and rhythm without rub or gallop GI:  Soft nondistended mild generalized discomfort , not in excess of usual, without rebound bowel sounds are positive Extremities:  1+ lower extremity edema bilaterally   Lab Results: Results for orders placed or performed during the hospital encounter of 09/22/15 (from the past 24 hour(s))  Glucose, capillary     Status: Abnormal   Collection Time: 09/24/15  4:22 PM  Result Value Ref Range   Glucose-Capillary 190 (H) 65 - 99 mg/dL   Comment 1 Notify RN   Urinalysis complete, with microscopic ([facility] only)     Status: Abnormal   Collection Time: 09/24/15  6:08 PM  Result Value Ref Range   Color, Urine YELLOW (A) YELLOW   APPearance CLEAR (A) CLEAR   Glucose, UA NEGATIVE NEGATIVE mg/dL   Bilirubin Urine NEGATIVE NEGATIVE   Ketones, ur NEGATIVE NEGATIVE mg/dL   Specific Gravity, Urine 1.003 (L) 1.005 - 1.030   Hgb urine dipstick NEGATIVE NEGATIVE   pH 6.0 5.0 - 8.0   Protein, ur NEGATIVE NEGATIVE  mg/dL   Nitrite NEGATIVE NEGATIVE   Leukocytes, UA NEGATIVE NEGATIVE   RBC / HPF NONE SEEN 0 - 5 RBC/hpf   WBC, UA 0-5 0 - 5 WBC/hpf   Bacteria, UA NONE SEEN NONE SEEN   Squamous Epithelial / LPF 0-5 (A) NONE SEEN   Mucous PRESENT   Glucose, capillary     Status: Abnormal   Collection Time: 09/24/15  9:32 PM  Result Value Ref Range   Glucose-Capillary 112 (H) 65 - 99 mg/dL  Glucose, capillary     Status: Abnormal   Collection Time: 09/24/15 11:44 PM  Result Value Ref Range   Glucose-Capillary 123 (H) 65 - 99 mg/dL  CBC     Status: Abnormal   Collection Time: 09/25/15  3:15 AM  Result Value Ref Range   WBC 6.5 3.6 - 11.0 K/uL   RBC 2.82 (L) 3.80 - 5.20 MIL/uL   Hemoglobin 9.1 (L) 12.0 - 16.0 g/dL   HCT 26.6 (L) 35.0 - 47.0 %   MCV 94.1 80.0 - 100.0 fL   MCH 32.2 26.0 - 34.0 pg   MCHC 34.2 32.0 - 36.0 g/dL   RDW 18.1 (H) 11.5 - 14.5 %   Platelets 105 (L) 150 - 440 K/uL  Basic metabolic panel     Status: Abnormal   Collection Time: 09/25/15  3:15 AM  Result Value Ref Range   Sodium 134 (L) 135 -  145 mmol/L   Potassium 3.4 (L) 3.5 - 5.1 mmol/L   Chloride 105 101 - 111 mmol/L   CO2 24 22 - 32 mmol/L   Glucose, Bld 112 (H) 65 - 99 mg/dL   BUN <5 (L) 6 - 20 mg/dL   Creatinine, Ser 0.65 0.44 - 1.00 mg/dL   Calcium 7.5 (L) 8.9 - 10.3 mg/dL   GFR calc non Af Amer >60 >60 mL/min   GFR calc Af Amer >60 >60 mL/min   Anion gap 5 5 - 15  Glucose, capillary     Status: Abnormal   Collection Time: 09/25/15  4:02 AM  Result Value Ref Range   Glucose-Capillary 116 (H) 65 - 99 mg/dL  Protime-INR     Status: Abnormal   Collection Time: 09/25/15  5:20 AM  Result Value Ref Range   Prothrombin Time 17.5 (H) 11.4 - 15.0 seconds   INR 1.43   Glucose, capillary     Status: Abnormal   Collection Time: 09/25/15  8:08 AM  Result Value Ref Range   Glucose-Capillary 115 (H) 65 - 99 mg/dL  Glucose, capillary     Status: Abnormal   Collection Time: 09/25/15 12:09 PM  Result Value Ref Range    Glucose-Capillary 128 (H) 65 - 99 mg/dL      Recent Labs  09/23/15 0324 09/24/15 0012 09/24/15 0352 09/25/15 0315  WBC 5.1  --  5.8 6.5  HGB 6.9* 9.1* 9.0* 9.1*  HCT 19.9*  --  26.0* 26.6*  PLT 114*  --  110* 105*   BMET  Recent Labs  09/22/15 1458 09/23/15 0324 09/25/15 0315  NA 129* 133* 134*  K 3.5 3.9 3.4*  CL 99* 103 105  CO2 '22 24 24  '$ GLUCOSE 179* 151* 112*  BUN 12 9 <5*  CREATININE 0.59 0.53 0.65  CALCIUM 8.2* 7.8* 7.5*   LFT  Recent Labs  09/22/15 1458  PROT 6.4*  ALBUMIN 3.0*  AST 33  ALT 19  ALKPHOS 76  BILITOT 0.5   PT/INR  Recent Labs  09/24/15 1222 09/25/15 0520  LABPROT 17.0* 17.5*  INR 1.37 1.43   Hepatitis Panel No results for input(s): HEPBSAG, HCVAB, HEPAIGM, HEPBIGM in the last 72 hours. C-Diff No results for input(s): CDIFFTOX in the last 72 hours. No results for input(s): CDIFFPCR in the last 72 hours.   Studies/Results: US Paracentesis  09/24/2015  INDICATION: Ascites EXAM: ULTRASOUND GUIDED therapeutic and diagnostic PARACENTESIS MEDICATIONS: None. COMPLICATIONS: None immediate. PROCEDURE: Informed written consent was obtained from the patient after a discussion of the risks, benefits and alternatives to treatment. A timeout was performed prior to the initiation of the procedure. Initial ultrasound scanning demonstrates a large amount of ascites within the right lower abdominal quadrant. The right lower abdomen was prepped and draped in the usual sterile fashion. 1% lidocaine with epinephrine was used for local anesthesia. Following this, a Safe-T-Centesis catheter was introduced. An ultrasound image was saved for documentation purposes. The paracentesis was performed. The catheter was removed and a dressing was applied. The patient tolerated the procedure well without immediate post procedural complication. FINDINGS: A total of approximately 5.9 L of serous fluid was removed. Samples were sent to the laboratory as requested by the  clinical team. IMPRESSION: Successful ultrasound-guided paracentesis yielding 5.9 liters of peritoneal fluid. Electronically Signed   By: Marijo Conception, M.D.   On: 09/24/2015 10:50    Scheduled Inpatient Medications:   . [MAR Hold] sodium chloride   Intravenous Once  . [  MAR Hold] ciprofloxacin  500 mg Oral Once per day on Mon Thu  . [MAR Hold] citalopram  20 mg Oral Daily  . [MAR Hold] furosemide  20 mg Oral BID  . [MAR Hold] gabapentin  300 mg Oral TID  . [MAR Hold] insulin aspart  0-9 Units Subcutaneous Q4H  . [MAR Hold] lactulose  10 g Oral TID  . [MAR Hold] magnesium oxide  400 mg Oral Daily  . [MAR Hold] pantoprazole  40 mg Oral BID  . [MAR Hold] sodium chloride flush  3 mL Intravenous Q12H  . [MAR Hold] sodium chloride flush  3 mL Intravenous Q12H  . [MAR Hold] spironolactone  50 mg Oral Daily  . [MAR Hold] sucralfate  1 g Oral TID WC & HS    Continuous Inpatient Infusions:   . sodium chloride 1,000 mL (09/25/15 1437)  . sodium chloride      PRN Inpatient Medications:  [MAR Hold] sodium chloride, [MAR Hold] acetaminophen **OR** [MAR Hold] acetaminophen, [MAR Hold] albuterol, [MAR Hold] ondansetron **OR** [MAR Hold] ondansetron (ZOFRAN) IV, [MAR Hold] oxyCODONE, [MAR Hold] sodium chloride flush  Miscellaneous:   Assessment:  1. 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 Patient has been abstinent from alcohol for 4 months. Issue of recurrent GI blood loss of uncertain etiology. Recent EGD uninformative with the exception of a mild erosive gastritis. No evidence of esophageal varices, gastric varices or portal hypertensive gastropathy. 2. Chronic hepatitis C awaiting 6 months abstinence before beginning treatment.  Plan:  1. Colonoscopy today. She has been given 2 days of vitamin K due to a elevated pro time. May be a new baseline for her.  I have discussed the risks benefits and complications of procedures to include not limited to bleeding, infection, perforation and the risk of  sedation and the patient wishes to proceed.  Lollie Sails MD 09/25/2015, 2:43 PM

## 2015-09-25 NOTE — Anesthesia Preprocedure Evaluation (Signed)
Anesthesia Evaluation  Patient identified by MRN, date of birth, ID band Patient awake    Reviewed: Allergy & Precautions, NPO status , [name] Chart, lab work & pertinent test results  Airway Mallampati: II  TM Distance: <3 FB     Dental  (+) Upper Dentures, Lower Dentures   Pulmonary shortness of breath and with exertion, COPD, Current Smoker,     + decreased breath sounds      Cardiovascular Exercise Tolerance: Good hypertension, Pt. on medications  Rhythm:Regular     Neuro/Psych PSYCHIATRIC DISORDERS Anxiety Depression  Neuromuscular disease    GI/Hepatic negative GI ROS, (+) Hepatitis -, C  Endo/Other  diabetes, Type 2, Oral Hypoglycemic Agents  Renal/GU Renal disease     Musculoskeletal  (+) Fibromyalgia -  Abdominal ascites  Peds  Hematology  (+) anemia ,   Anesthesia Other Findings Past Medical History:   Depression                                                   Anxiety                                                      High risk medications (not anticoagulants) lon*              Vitamin D deficiency disease                                 Lumbago                                                      Hypertension                                                 Chronic hepatitis C (HCC)                                    Alcohol abuse                                                Bronchitis                                                   Arthropathy  Back pain                                                    Shoulder pain                                                Stress headaches                                             Hyperglycemia                                                SOB (shortness of breath)                                    Muscle spasm                                                 Edema                                                         Cirrhosis (HCC)                                              Polyarthritis                                                Fatigue                                                      Sinusitis                                                    RLS (restless legs syndrome)                                 Alcoholic cirrhosis of liver without ascites (*              Reflux  Fibromyalgia                                                 Spine disorder                                               Chronic LBP                                     12/25/2014      Comment:Overview:  Post extensive surgery    DDD (degenerative disc disease), lumbar         01/01/2015    Lumbar radicular pain                           01/01/2015    Neck pain                                       01/01/2015    Left leg pain                                   01/01/2015    Sacroiliac pain                                 01/01/2015    Upper back pain                                 01/01/2015    Urinary frequency                                            Anemia                                                       Diabetes mellitus without complication ([place])                 Reproductive/Obstetrics                             Anesthesia Physical  Anesthesia Plan  ASA: III  Anesthesia Plan: General   Post-op Pain Management:    Induction: Intravenous  Airway Management Planned: Natural Airway and Nasal Cannula  Additional Equipment:   Intra-op Plan:   Post-operative Plan:   Informed Consent: I have reviewed the patients History and Physical, chart, labs and discussed the procedure including the risks, benefits and alternatives for the proposed anesthesia with the patient or authorized representative who has indicated his/her understanding and acceptance.     Plan Discussed  with: CRNA  Anesthesia Plan Comments:          Anesthesia Quick Evaluation

## 2015-09-26 ENCOUNTER — Encounter: Payer: Self-pay | Admitting: Gastroenterology

## 2015-09-26 LAB — GLUCOSE, CAPILLARY: Glucose-Capillary: 95 mg/dL (ref 65–99)

## 2015-09-26 MED ORDER — PHYTONADIONE 5 MG PO TABS
5.0000 mg | ORAL_TABLET | ORAL | Status: DC
  Administered 2015-09-26: 5 mg via ORAL
  Filled 2015-09-26: qty 1

## 2015-09-26 MED ORDER — PREDNISONE 5 MG PO TABS: ORAL_TABLET | ORAL | Status: DC

## 2015-09-26 MED ORDER — FUROSEMIDE 20 MG PO TABS: 20.0000 mg | ORAL_TABLET | Freq: Two times a day (BID) | ORAL | Status: DC

## 2015-09-26 MED ORDER — PHYTONADIONE 5 MG PO TABS: 5.0000 mg | ORAL_TABLET | ORAL | Status: DC

## 2015-09-26 MED ORDER — LIDOCAINE 5 % EX PTCH: 1.0000 | MEDICATED_PATCH | CUTANEOUS | Status: DC

## 2015-09-26 MED ORDER — SPIRONOLACTONE 50 MG PO TABS: 50.0000 mg | ORAL_TABLET | Freq: Every day | ORAL | Status: DC

## 2015-09-26 MED ORDER — LIDOCAINE 5 % EX PTCH
1.0000 | MEDICATED_PATCH | CUTANEOUS | Status: DC
  Administered 2015-09-26: 1 via TRANSDERMAL
  Filled 2015-09-26: qty 1

## 2015-09-26 NOTE — Progress Notes (Signed)
Patient was discharged home. Reviewed meds, last doses, and follow-up appointments.  Scripts given to husband. IV removed with cath intact.  Reviewed diet for patient and spouse.

## 2015-09-26 NOTE — Discharge Summary (Signed)
[facility] [place] NAME: Audrey Garrett    MR#:  [id number]  [place]:  1960-05-23  DATE OF ADMISSION:  09/22/2015 ADMITTING PHYSICIAN: Hillary Bow, MD  DATE OF DISCHARGE: 09/26/2015 11:56 AM  PRIMARY CARE PHYSICIAN: Volanda Napoleon, MD    ADMISSION DIAGNOSIS:  Gastrointestinal hemorrhage with melena [K92.1]  DISCHARGE DIAGNOSIS:  Active Problems:   GI bleed   SECONDARY DIAGNOSIS:   Past Medical History  Diagnosis Date  . Depression   . Anxiety   . High risk medications (not anticoagulants) long-term use   . Vitamin D deficiency disease   . Lumbago   . Hypertension   . Chronic hepatitis C ([place])   . Alcohol abuse   . Bronchitis   . Arthropathy   . Back pain   . Shoulder pain   . Stress headaches   . Hyperglycemia   . SOB (shortness of breath)   . Muscle spasm   . Edema   . Cirrhosis ([place])   . Polyarthritis   . Fatigue   . Sinusitis   . RLS (restless legs syndrome)   . 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 Reflux   . Fibromyalgia   . Spine disorder   . Chronic LBP 12/25/2014    Overview:  Post extensive surgery   . DDD (degenerative disc disease), lumbar 01/01/2015  . Lumbar radicular pain 01/01/2015  . Neck pain 01/01/2015  . Left leg pain 01/01/2015  . Sacroiliac pain 01/01/2015  . Upper back pain 01/01/2015  . Urinary frequency   . Anemia   . Diabetes mellitus without complication ([place])     HOSPITAL COURSE:   1. Acute blood loss anemia secondary to GI bleed. Patient was transfused a total 4 units of packed red blood cells during the hospital course. No active bleeding seen on recent endoscopy or yesterday's colonoscopy. GI recommended a full liquid diet for 2 days but patient states she is going to eat. Continue Protonix and Carafate. 2. Cirrhosis with ascites and borderline hypotension. History of hepatitis C. I decreased her Lasix and Aldactone doses secondary to hypotension. Patient  had a lot of fluid taken off during the paracentesis. Follow-up with GI as outpatient. 3. Acute back pain. CT scan of the lumbar spine was negative. I did dose prednisone at home for a quick taper. Can consider an MRI of the spine as outpatient. I will not do it at this point since she had a clip with a large polyp removal. 4. Hyponatremia improved 5. Hypokalemia this improved with replacement  DISCHARGE CONDITIONS:   Satisfactory  CONSULTS OBTAINED:  Gastrointestinal DRUG ALLERGIES:  No Known Allergies  DISCHARGE MEDICATIONS:   Discharge Medication List as of 09/26/2015 10:30 AM    START taking these medications   Details  lidocaine (LIDODERM) 5 % Place 1 patch onto the skin daily. Remove & Discard patch within 12 hours or as directed by MD, Starting 09/26/2015, Until Discontinued, Print    phytonadione (VITAMIN K) 5 MG tablet Take 1 tablet (5 mg total) by mouth 3 (three) times a week., Starting 09/26/2015, Until Discontinued, Print    predniSONE (DELTASONE) 5 MG tablet One tab daily for three days, Print      CONTINUE these medications which have CHANGED   Details  furosemide (LASIX) 20 MG tablet Take 1 tablet (20 mg total) by mouth 2 (two) times daily., Starting 09/26/2015, Until Discontinued, Print    spironolactone (ALDACTONE) 50  MG tablet Take 1 tablet (50 mg total) by mouth daily., Starting 09/26/2015, Until Discontinued, Print      CONTINUE these medications which have NOT CHANGED   Details  ciprofloxacin (CIPRO) 500 MG tablet Take 500 mg by mouth 2 (two) times a week. Pt takes on Monday and Friday., Until Discontinued, Historical Med    citalopram (CELEXA) 20 MG tablet Take 20 mg by mouth daily., Until Discontinued, Historical Med    ferrous sulfate 325 (65 FE) MG tablet Take 325 mg by mouth daily with breakfast., Until Discontinued, Historical Med    gabapentin (NEURONTIN) 300 MG capsule Take 300 mg by mouth 3 (three) times daily., Until Discontinued, Historical Med     lactulose (CHRONULAC) 10 GM/15ML solution Take 10 g by mouth 3 (three) times daily., Until Discontinued, Historical Med    Magnesium Oxide 250 MG TABS Take 250 mg by mouth daily., Until Discontinued, Historical Med    metFORMIN (GLUCOPHAGE) 500 MG tablet Take 500 mg by mouth 2 (two) times daily with a meal. , Until Discontinued, Historical Med    Multiple Vitamins-Minerals (MULTIVITAMIN WITH MINERALS) tablet Take 1 tablet by mouth daily., Until Discontinued, Historical Med    Oxycodone HCl 10 MG TABS Take 20 mg by mouth every 6 (six) hours as needed (for pain)., Until Discontinued, Historical Med    pantoprazole (PROTONIX) 40 MG tablet Take 40 mg by mouth 2 (two) times daily before a meal. , Until Discontinued, Historical Med    sucralfate (CARAFATE) 1 GM/10ML suspension Take 1 g by mouth 4 (four) times daily -  with meals and at bedtime., Until Discontinued, Historical Med         DISCHARGE INSTRUCTIONS:   Follow-up PMD one week Follow-up back surgery in 2 weeks Follow-up Dr. Gustavo Lah gastroenterology 2 weeks  If you experience worsening of your admission symptoms, develop shortness of breath, life threatening emergency, suicidal or homicidal thoughts you must seek medical attention immediately by calling 911 or calling your MD immediately  if symptoms less severe.  You Must read complete instructions/literature along with all the possible adverse reactions/side effects for all the Medicines you take and that have been prescribed to you. Take any new Medicines after you have completely understood and accept all the possible adverse reactions/side effects.   Please note  You were cared for by a hospitalist during your hospital stay. If you have any questions about your discharge medications or the care you received while you were in the hospital after you are discharged, you can call the unit and asked to speak with the hospitalist on call if the hospitalist that took care of you is  not available. Once you are discharged, your primary care physician will handle any further medical issues. Please note that NO REFILLS for any discharge medications will be authorized once you are discharged, as it is imperative that you return to your primary care physician (or establish a relationship with a primary care physician if you do not have one) for your aftercare needs so that they can reassess your need for medications and monitor your lab values.    Today   CHIEF COMPLAINT:   Chief Complaint  Patient presents with  . GI Bleeding  . Abnormal Lab    HISTORY OF PRESENT ILLNESS:  Audrey Garrett  is a 55 y.o. female presented with GI bleed and anemia   VITAL SIGNS:  Blood pressure 102/74, pulse 57, temperature 98.1 F (36.7 C), temperature source Oral, resp. rate 18, height  $'5\' 6"'W$  (1.676 m), weight 76.295 kg (168 lb 3.2 oz), SpO2 100 %.    PHYSICAL EXAMINATION:  GENERAL:  55 y.o.-year-old patient lying in the bed with no acute distress.  EYES: Pupils equal, round, reactive to light and accommodation. No scleral icterus. Extraocular muscles intact.  HEENT: Head atraumatic, normocephalic. Oropharynx and nasopharynx clear.  NECK:  Supple, no jugular venous distention. No thyroid enlargement, no tenderness.  LUNGS: Normal breath sounds bilaterally, no wheezing, rales,rhonchi or crepitation. No use of accessory muscles of respiration.  CARDIOVASCULAR: S1, S2 normal. No murmurs, rubs, or gallops.  ABDOMEN: Soft, non-tender, non-distended. Bowel sounds present. No organomegaly or mass.  EXTREMITIES: No pedal edema, cyanosis, or clubbing.  NEUROLOGIC: Cranial nerves II through XII are intact. Muscle strength 5/5 in all extremities. Sensation intact. Gait not checked.  PSYCHIATRIC: The patient is alert and oriented x 3.  SKIN: No obvious rash, lesion, or ulcer.   DATA REVIEW:   CBC  Recent Labs Lab 09/25/15 0315  WBC 6.5  HGB 9.1*  HCT 26.6*  PLT 105*    Chemistries    Recent Labs Lab 09/22/15 1458  09/25/15 0315  NA 129*  < > 134*  K 3.5  < > 3.4*  CL 99*  < > 105  CO2 22  < > 24  GLUCOSE 179*  < > 112*  BUN 12  < > <5*  CREATININE 0.59  < > 0.65  CALCIUM 8.2*  < > 7.5*  AST 33  --   --   ALT 19  --   --   ALKPHOS 76  --   --   BILITOT 0.5  --   --   < > = values in this interval not displayed.  Microbiology Results  Results for orders placed or performed during the hospital encounter of 09/22/15  Culture, body fluid-bottle     Status: None (Preliminary result)   Collection Time: 09/24/15  9:19 AM  Result Value Ref Range Status   Specimen Description FLUID PERITONEAL  Final   Special Requests NONE  Final   Culture   Final    NO GROWTH < 24 HOURS Performed at [facility]    Report Status PENDING  Incomplete  Gram stain     Status: None   Collection Time: 09/24/15  9:19 AM  Result Value Ref Range Status   Specimen Description FLUID PERITONEAL  Final   Special Requests NONE  Final   Gram Stain   Final    FEW WBC PRESENT,BOTH PMN AND MONONUCLEAR NO ORGANISMS SEEN Performed at [facility]    Report Status 09/24/2015 FINAL  Final    RADIOLOGY:  Ct Lumbar Spine Wo Contrast  09/25/2015  CLINICAL DATA:  Acute exacerbation of chronic back pain. EXAM: CT LUMBAR SPINE WITHOUT CONTRAST TECHNIQUE: Multidetector CT imaging of the lumbar spine was performed without intravenous contrast administration. Multiplanar CT image reconstructions were also generated. COMPARISON:  Lumbar radiographs from 6 10/2015 FINDINGS: There has been previous posterior fusion using pedicle screws and rods from the lower thoracic region to the sacrum and pelvis. Within limits for detection on noncontrast CT lumbar, I do not see an acute fracture, intraspinal hematoma, or lumbar compressive lesion. No gross hardware malposition. Solid posterior arthrodesis through least the L1 through S1 segments. Aortic atherosclerosis. Renal cortical calcifications,  incompletely evaluated. Suspected gallstones. IMPRESSION: No acute findings status post thoracosacral fusion. Electronically Signed   By: Staci Righter M.D.   On: 09/25/2015 18:42  Management plans discussed with the patient, family and they are in agreement.  CODE STATUS:     Code Status Orders        Start     Ordered   09/22/15 1651  Full code   Continuous     09/22/15 1651    Code Status History    Date Active Date Inactive Code Status Order ID Comments User Context   05/26/2015  8:18 PM 05/28/2015  5:21 PM Full Code [id number]  Demetrios Loll, MD Inpatient   05/21/2015  2:59 PM 05/22/2015  7:30 PM Full Code [id number]  Gladstone Lighter, MD Inpatient   11/29/2013 12:02 PM 11/30/2013  3:31 AM Full Code [id number]  Logan Bores, MD HOV      TOTAL TIME TAKING CARE OF THIS PATIENT: 35 minutes.    Loletha Grayer M.D on 09/26/2015 at 1:50 PM  Between 7am to 6pm - Pager - [phone]  After 6pm go to [email or web address] - password [email or web address]  [facility] Office  [phone]  CC: Primary care physician; Volanda Napoleon, MD

## 2015-09-29 ENCOUNTER — Other Ambulatory Visit: Payer: Self-pay | Admitting: Gastroenterology

## 2015-09-29 DIAGNOSIS — K7031 Alcoholic cirrhosis of liver with ascites: Secondary | ICD-10-CM

## 2015-09-29 LAB — CULTURE, BODY FLUID-BOTTLE: Culture: NO GROWTH

## 2015-09-29 LAB — SURGICAL PATHOLOGY

## 2015-09-30 ENCOUNTER — Other Ambulatory Visit
Admission: RE | Admit: 2015-09-30 | Discharge: 2015-09-30 | Disposition: A | Discharge status: Home / Self Care | Payer: Medicare Other | Source: Ambulatory Visit | Attending: Gastroenterology | Admitting: Gastroenterology

## 2015-09-30 DIAGNOSIS — R188 Other ascites: Secondary | ICD-10-CM | POA: Insufficient documentation

## 2015-09-30 LAB — BASIC METABOLIC PANEL
Anion gap: 6 (ref 5–15)
BUN: 7 mg/dL (ref 6–20)
CO2: 27 mmol/L (ref 22–32)
Calcium: 8.3 mg/dL — ABNORMAL LOW (ref 8.9–10.3)
Chloride: 97 mmol/L — ABNORMAL LOW (ref 101–111)
Creatinine, Ser: 0.6 mg/dL (ref 0.44–1.00)
GFR calc Af Amer: >60 mL/min (ref >60)
GFR calc non Af Amer: >60 mL/min (ref >60)
Glucose, Bld: 106 mg/dL — ABNORMAL HIGH (ref 65–99)
Potassium: 3.6 mmol/L (ref 3.5–5.1)
Sodium: 130 mmol/L — ABNORMAL LOW (ref 135–145)

## 2015-09-30 LAB — CBC WITH DIFFERENTIAL/PLATELET
Basophils Absolute: 0 10*3/uL (ref 0–0.1)
Basophils Relative: 1 %
Eosinophils Absolute: 0.1 10*3/uL (ref 0–0.7)
Eosinophils Relative: 2 %
HCT: 31.5 % — ABNORMAL LOW (ref 35.0–47.0)
Hemoglobin: 10.8 g/dL — ABNORMAL LOW (ref 12.0–16.0)
Lymphocytes Relative: 24 %
Lymphs Abs: 1.4 10*3/uL (ref 1.0–3.6)
MCH: 32.5 pg (ref 26.0–34.0)
MCHC: 34.3 g/dL (ref 32.0–36.0)
MCV: 94.7 fL (ref 80.0–100.0)
Monocytes Absolute: 0.8 10*3/uL (ref 0.2–0.9)
Monocytes Relative: 14 %
Neutro Abs: 3.5 10*3/uL (ref 1.4–6.5)
Neutrophils Relative %: 59 %
Platelets: 107 10*3/uL — ABNORMAL LOW (ref 150–440)
RBC: 3.33 MIL/uL — ABNORMAL LOW (ref 3.80–5.20)
RDW: 17.5 % — ABNORMAL HIGH (ref 11.5–14.5)
WBC: 5.8 10*3/uL (ref 3.6–11.0)

## 2015-09-30 LAB — HEPATIC FUNCTION PANEL
ALT: 19 U/L (ref 14–54)
AST: 30 U/L (ref 15–41)
Albumin: 2.8 g/dL — ABNORMAL LOW (ref 3.5–5.0)
Alkaline Phosphatase: 76 U/L (ref 38–126)
Bilirubin, Direct: 0.2 mg/dL (ref 0.1–0.5)
Indirect Bilirubin: 0.5 mg/dL (ref 0.3–0.9)
Total Bilirubin: 0.7 mg/dL (ref 0.3–1.2)
Total Protein: 5.8 g/dL — ABNORMAL LOW (ref 6.5–8.1)

## 2015-09-30 LAB — PROTIME-INR
INR: 1.34
Prothrombin Time: 16.7 seconds — ABNORMAL HIGH (ref 11.4–15.0)

## 2015-09-30 LAB — AMMONIA: Ammonia: <9 umol/L — ABNORMAL LOW (ref 9–35)

## 2015-10-01 ENCOUNTER — Ambulatory Visit (HOSPITAL_BASED_OUTPATIENT_CLINIC_OR_DEPARTMENT_OTHER): Payer: Medicare Other | Admitting: Pain Medicine

## 2015-10-01 ENCOUNTER — Encounter: Payer: Self-pay | Admitting: Pain Medicine

## 2015-10-01 ENCOUNTER — Ambulatory Visit
Admission: RE | Admit: 2015-10-01 | Discharge: 2015-10-01 | Disposition: A | Discharge status: Home / Self Care | Payer: Medicare Other | Source: Ambulatory Visit | Attending: Gastroenterology | Admitting: Gastroenterology

## 2015-10-01 VITALS — BP 118/69 | HR 84 | Temp 98.6°F | Resp 16 | Ht 69.0 in | Wt 171.0 lb

## 2015-10-01 DIAGNOSIS — Z79891 Long term (current) use of opiate analgesic: Secondary | ICD-10-CM | POA: Diagnosis not present

## 2015-10-01 DIAGNOSIS — K7031 Alcoholic cirrhosis of liver with ascites: Secondary | ICD-10-CM

## 2015-10-01 DIAGNOSIS — M489 Spondylopathy, unspecified: Secondary | ICD-10-CM

## 2015-10-01 DIAGNOSIS — G8929 Other chronic pain: Secondary | ICD-10-CM | POA: Diagnosis not present

## 2015-10-01 DIAGNOSIS — F1190 Opioid use, unspecified, uncomplicated: Secondary | ICD-10-CM | POA: Diagnosis not present

## 2015-10-01 DIAGNOSIS — D696 Thrombocytopenia, unspecified: Secondary | ICD-10-CM

## 2015-10-01 DIAGNOSIS — M792 Neuralgia and neuritis, unspecified: Secondary | ICD-10-CM

## 2015-10-01 DIAGNOSIS — Z5181 Encounter for therapeutic drug level monitoring: Secondary | ICD-10-CM | POA: Diagnosis not present

## 2015-10-01 DIAGNOSIS — M4325 Fusion of spine, thoracolumbar region: Secondary | ICD-10-CM

## 2015-10-01 DIAGNOSIS — M4802 Spinal stenosis, cervical region: Secondary | ICD-10-CM

## 2015-10-01 DIAGNOSIS — M4310 Spondylolisthesis, site unspecified: Secondary | ICD-10-CM

## 2015-10-01 LAB — ALBUMIN, FLUID (OTHER): Albumin, Fluid: 1.2 g/dL

## 2015-10-01 LAB — BODY FLUID CELL COUNT WITH DIFFERENTIAL
Eos, Fluid: 0 %
Lymphs, Fluid: 34 %
Monocyte-Macrophage-Serous Fluid: 34 %
Neutrophil Count, Fluid: 32 %
Total Nucleated Cell Count, Fluid: 510 cu mm

## 2015-10-01 LAB — GRAM STAIN

## 2015-10-01 LAB — PROTEIN, BODY FLUID: Total protein, fluid: <3 g/dL

## 2015-10-01 MED ORDER — OXYCODONE HCL 20 MG PO TABS: 20.0000 mg | ORAL_TABLET | Freq: Four times a day (QID) | ORAL | Status: DC | PRN

## 2015-10-01 NOTE — Progress Notes (Signed)
Patient here today for medication management.  Has had a flare up with her back when she moved the wrong way while working in her home. She went to the ED dt the pain and xray was done.  Also while being in hospital for colonsocopy there was a CT scan done which showed all of her hardware to be in place  Patient would like to talk to you about the Lidoderm patch's.   Patient is currently being treated for Stage 4 cirrhosis of the liver and is going to have procedure done today where they will tap some fluid from abdomen.   Pill count oxycodone 10  19/240 filled on 09/05/15.  Patient takes 20 mg q 6 hours. Safety precautions to be maintained throughout the outpatient stay will include: orient to surroundings, keep bed in low position, maintain call bell within reach at all times, provide assistance with transfer out of bed and ambulation.

## 2015-10-01 NOTE — Progress Notes (Signed)
[name] Name: Audrey Garrett  Patient type: Established  MRN: [id number]  Service setting: Ambulatory outpatient  DOB: 31-Dec-1960  Location: [facility]  DOS: 10/01/2015  Primary Care Physician: Volanda Napoleon, MD  Note by: Kathlen Brunswick Dossie Arbour, M.D, DABA, DABAPM, DABPM, DABIPP, FIPP  Referring Physician: Jodi Marble, MD  Specialty: Board-Certified Interventional Pain Management  Last Visit to Pain Management: 09/19/2015   Primary Reason(s) for Visit: Encounter for prescription drug management (Level of risk: moderate) CC: Back Pain   HPI  Ms. Mccutchen is a 55 y.o. year old, female patient, who returns today as an established patient. She has ACUTE SINUSITIS, UNSPECIFIED; BRONCHITIS, ACUTE; Insomnia, persistent; Major depression in remission ([place]); BP (high blood pressure); Bilateral low back pain with left-sided sciatica; Chronic pain syndrome; Cervical radicular pain; Other long term (current) drug therapy; Uncomplicated opioid dependence ([place]); Long term prescription opiate use; Lumbar facet arthropathy; Failed back surgical syndrome; Lumbar facet syndrome (Bilateral); Osteoarthritis of spine with radiculopathy, lumbar region; Pain of paraspinal muscle; Low back pain with radiation; Opiate use (120 MME/Day); Encounter for therapeutic drug level monitoring; Encounter for general adult medical examination without abnormal findings; HCV (hepatitis C virus); Chronic pain; At risk for osteopenia; Lumbar spondylosis; Chronic lumbar radicular pain (Left); Chronic neck pain; Chronic lower extremity pain (Left); Chronic sacroiliac joint pain; Chronic upper back pain; Long term current use of opiate analgesic; Encounter for chronic pain management; Hypomagnesemia (low magnesium levels); Ascites; Liver cirrhosis, alcoholic ([place]); Hepatitis C; Hypotension; Anemia; Thrombocytopenia ([place]); Coagulopathy ([place]); Hyperglycemia; Polyneuropathy ([place]); Urinary retention; Difficulty  urinating; Nephrolithiasis; Alcoholic cirrhosis of liver with ascites ([place]); Depression, major, recurrent, moderate ([place]); Alcohol abuse; Abdominal pain, chronic, epigastric; Hepatic cirrhosis (H[facility]); GI bleed; Neurogenic pain; S/P thoracolumbar fusion (T5-L5); Grade 1 Retrolisthesis (4 mm) of C5 over C6 and C6 over C7; and Cervical foraminal stenosis (Left C5-6; Bilateral C6-7) on her problem list.. Her primarily concern today is the Back Pain   Pain Assessment: Self-Reported Pain Score: 7  Clinically the patient looks like a 3/10 Reported level is inconsistent with clinical obrservations Information on the proper use of the pain score provided to the patient today. Pain Type: Chronic pain Pain Location: Back Pain Orientation: Lower Pain Descriptors / Indicators:  (patient states she went to the ED approx 1 week after injury and had an xray, CT was done during another hospital stay, hardware intact) Pain Frequency: Constant (pain is dependant on activity and how she holds.)  The patient comes into the clinics today for pharmacological management of her chronic pain. I last saw this patient on 09/19/2015. The patient  reports that she does not use illicit drugs. Her body mass index is 25.24 kg/(m^2).   The patient has stage IV liver disease and thrombocytopenia making her a poor candidate for interventional therapies.  Date of Last Visit: 07/02/15 Service Provided on Last Visit: Med Refill  Controlled Substance Pharmacotherapy Assessment & REMS (Risk Evaluation and Mitigation Strategy)  Analgesic: Oxycodone IR 20 mg every 6 (80 mg/day) MME/day: 120 mg/day. Pill Count: Pill count oxycodone 10 19/240 filled on 09/05/15. Patient takes 20 mg q 6 hours.  Pharmacokinetics: Onset of action (Liberation/Absorption): Within expected pharmacological parameters Time to Peak effect (Distribution): Timing and results are as within normal expected parameters Duration of action (Metabolism/Excretion): Within  normal limits for medication Pharmacodynamics: Analgesic Effect: More than 50% Activity Facilitation: Medication(s) allow patient to sit, stand, walk, and do the basic ADLs Perceived Effectiveness: Described as relatively effective, allowing for increase in  activities of daily living (ADL) Side-effects or Adverse reactions: None reported Monitoring: [place] PMP: Online review of the past 75-monthperiod conducted. Compliant with practice rules and regulations Last UDS on record: TOXASSURE SELECT 13  Date Value Ref Range Status  07/02/2015 FINAL  Final    Comment:    ==================================================================== TOXASSURE SELECT 13 (MW) ==================================================================== Specimen Alert Note:  Urinary creatinine is low; ability to detect some drugs may be compromised.  Interpret results with caution. ==================================================================== Test                             Result       Flag       Units Drug Present and Declared for Prescription Verification   Oxycodone                      13853        EXPECTED   ng/mg creat   Oxymorphone                    7173         EXPECTED   ng/mg creat   Noroxycodone                   7907         EXPECTED   ng/mg creat   Noroxymorphone                 2620         EXPECTED   ng/mg creat    Sources of oxycodone are scheduled prescription medications.    Oxymorphone, noroxycodone, and noroxymorphone are expected    metabolites of oxycodone. Oxymorphone is also available as a    scheduled prescription medication. ==================================================================== Test                      Result    Flag   Units      Ref Range   Creatinine              15        L      mg/dL      >=20 ==================================================================== Declared Medications:  The flagging and interpretation on this report are based on the  following  declared medications.  Unexpected results may arise from  inaccuracies in the declared medications.  **Note: The testing scope of this panel includes these medications:  Oxycodone  **Note: The testing scope of this panel does not include following  reported medications:  Ciprofloxacin (Cipro)  Fluticasone (Flonase)  Furosemide (Lasix)  Lactulose  Lisinopril  Magnesium  Mirtazapine (Remeron)  Multivitamin  Naloxone  Pantoprazole (Protonix)  Pregabalin (Lyrica)  Spironolactone (Aldactone)  Vitamin K ==================================================================== For clinical consultation, please call ([phone] ====================================================================    UDS interpretation: Compliant          Medication Assessment Form: Reviewed. Patient indicates being compliant with therapy Treatment compliance: Compliant Risk Assessment: Aberrant Behavior: None observed today Substance Use Disorder (SUD) Risk Level: Moderate-to-high Risk of opioid abuse or dependence: 0.7-3.0% with doses ? 36 MME/day and 6.1-26% with doses ? 120 MME/day. Opioid Risk Tool (ORT) Score: Total Score: 11 High Risk for Opioid Abuse (Score >8) Depression Scale Score: PHQ-2: PHQ-2 Total Score: 6 78.6% Probability of major depressive disorder (6) PHQ-9: PHQ-9 Total Score: 14 Moderate depression (10-14)  Pharmacologic Plan: No change in therapy, at this time  Laboratory Chemistry  Inflammation Markers Lab Results  Component Value Date   ESRSEDRATE 27 04/03/2015   CRP <0.5 04/03/2015    Renal Function Lab Results  Component Value Date   BUN 7 09/30/2015   CREATININE 0.60 09/30/2015   GFRAA >60 09/30/2015   GFRNONAA >60 09/30/2015    Hepatic Function Lab Results  Component Value Date   AST 30 09/30/2015   ALT 19 09/30/2015   ALBUMIN 2.8* 09/30/2015    Electrolytes Lab Results  Component Value Date   NA 130* 09/30/2015   K 3.6 09/30/2015   CL 97* 09/30/2015    CALCIUM 8.3* 09/30/2015   MG 1.5* 05/26/2015    Pain Modulating Vitamins Lab Results  Component Value Date   VITAMINB12 686 04/03/2015    Coagulation Parameters Lab Results  Component Value Date   INR 1.34 09/30/2015   LABPROT 16.7* 09/30/2015   APTT 39* 06/01/2015   PLT 107* 09/30/2015    Note: Labs Reviewed.  Recent Diagnostic Imaging  Cervical Imaging: Cervical CT w contrast:  Results for orders placed during the hospital encounter of 11/29/13  CT Cervical Spine W Contrast   Narrative CLINICAL DATA:  Neck and back pain.  Bilateral lower extremity pain.  FLUOROSCOPY TIME:  1 min 45 seconds  PROCEDURE: LUMBAR PUNCTURE FOR CERVICAL LUMBAR AND THORACIC MYELOGRAM  CERVICAL AND LUMBAR AND THORACIC MYELOGRAM  CT CERVICAL MYELOGRAM  CT LUMBAR MYELOGRAM  CT THORACIC MYELOGRAM  After thorough discussion of risks and benefits of the procedure including bleeding, infection, injury to nerves, blood vessels, adjacent structures as well as headache and CSF leak, written and oral informed consent was obtained. Consent was obtained by Dr. Logan Bores.  Patient was positioned prone on the fluoroscopy table. Local anesthesia was provided with 1% lidocaine without epinephrine after prepped and draped in the usual sterile fashion. Puncture was performed at L3-4 using a 3 1/2 inch 22-gauge spinal needle via a midline approach. Using a single pass through the dura, the needle was placed within the thecal sac, with return of clear CSF. 10 mL Omnipaque-300 was injected into the thecal sac, with normal opacification of the nerve roots and cauda equina consistent with free flow within the subarachnoid space. The patient was then moved to the trendelenburg position and contrast flowed into the Thoracic and Cervical spine regions.  I personally performed the lumbar puncture and administered the intrathecal contrast. I also personally supervised acquisition of the myelogram  images.  TECHNIQUE: Contiguous axial images were obtained through the Cervical, Thoracic, and Lumbar spine after the intrathecal infusion of contrast. Coronal and sagittal reconstructions were obtained of the axial image sets.  FINDINGS: CERVICAL, THORACIC AND LUMBAR MYELOGRAM FINDINGS:  There is slight retrolisthesis of C5 on C6. Moderate disc space narrowing is present at C5-6 and C6-7 with moderate end plate osteophyte formation, resulting in moderate ventral epidural defects at each level. Thoracic vertebral alignment is normal. Vertebral body heights are preserved without evidence of compression fracture. Extensive posterior spinal fusion has been performed from T5 through the lumbar spine and sacrum with fixation screws in the ilia as well. No thoracic spinal stenosis is identified. There is mild straightening of the normal lumbar lordosis without listhesis on upright neutral, flexion, and extension imaging. Mild disc space narrowing is present at L2-3 and L3-4. Dilated nerve root sleeves are noted bilaterally at S1.  CT CERVICAL MYELOGRAM FINDINGS:  There is straightening of the normal cervical lordosis. There is grade 1 retrolisthesis of C5 on C6 and C6 on C7,  measuring approximately 4 mm each. Moderate disc space narrowing and endplate osteophyte formation are present at these 2 levels. The cervical spinal canal is capacious on a developmental basis. The cervical spinal cord is normal in caliber. Mild scarring is noted in the lung apices.  C2-3 through C4-5:  Negative.  C5-6: Broad-based posterior disc osteophyte complex mildly effaces the ventral thecal sac without spinal stenosis. Asymmetric right-sided uncovertebral arthrosis results in moderate right neural foraminal stenosis.  C6-7: Broad-based posterior disc osteophyte complex mildly effaces the ventral thecal sac without spinal canal stenosis. Spinal canal maintains an AP diameter of 10 mm. Uncovertebral  arthrosis results in mild bilateral neural foraminal stenosis.  C7-T1: No disc herniation or stenosis. Small left-sided cervical rib which is fused with the proximal aspect of the first thoracic rib.  CT THORACIC MYELOGRAM FINDINGS:  Prior thoracolumbar fusion has been performed. Bilateral pedicle screws are present at T5, T6, T8, T10, and T12. The right T5 screw courses lateral to the pedicle and terminates lateral to the vertebral body. The left T6 screw traverses the lateral portion of the pedicle, terminating slightly lateral to the vertebral body. The right T12 screw partially traverses the right lateral recess. There is no evidence of screw loosening. Bilateral posterior fusion masses are present.  The thoracic spinal canal is overall capacious. There is a small central disc protrusion at T7-8 which may contact the spinal cord without spinal stenosis or mass effect on the cord. Similar, small central disc protrusions are also seen T8-9 and T9-10. There is mild circumferential disc bulging at T10-11 without spinal stenosis. There is also mild disc bulging at T11-12, without evidence of spinal stenosis although evaluation is partially limited at this level due to dependent layering of contrast in the dorsal canal. There is mild right neural foraminal stenosis at T10-11 due to prominent facet arthrosis.  There are small bilateral pleural effusions. Dependent subsegmental atelectasis is present in both lower lobes.  CT LUMBAR MYELOGRAM FINDINGS:  There is mild straightening of the normal lumbar lordosis. There is no listhesis. Vertebral body heights are preserved. Mild disc space narrowing is present at L2-3 and L3-4. Thoracolumbar fusion hardware is again seen, with bilateral pedicle screws from L1-L5 as well as at S1. Screws also extend into the ilia. There is no evidence of screw loosening. 12 mm, well-circumscribed lucency in the L4 vertebral body may represent a  hemangioma. Contrast was primarily layering dependently in the lower lumbar spine and sacral canal during initial supine imaging of the entire spine. Repeat imaging of the lumbar spine with the patient prone yielded better thecal sac opacification. Bilateral S1 and right S2 and S3 nerve root sleeves are dilated. Moderate atherosclerotic aortic calcification is noted. Punctate calculi are noted in both kidneys without hydronephrosis.  L1-2: Prior posterior decompression. No disc herniation or stenosis.  L2-3: Prior posterior decompression. Mild circumferential disc bulge without stenosis.  L3-4: Prior posterior decompression. Minimal disc bulging and endplate spurring without stenosis.  L4-5: Prior posterior decompression. Small left foraminal disc protrusion without stenosis.  L5-S1: Prior posterior decompression. Mild disc bulge without stenosis.  IMPRESSION: 1. Moderate cervical spondylosis at C5-6 and C6-7 without cervical spinal stenosis. Uncovertebral arthrosis results in moderate right neural foraminal stenosis at C5-6 and mild bilateral neural foraminal stenosis at C6-7. 2. Prior posterior fusion from T5 through the sacrum as above. Right T12 screw partially traverses the right lateral recess. 3. Mild lower thoracic spondylosis without thoracic spinal stenosis. Mild right neural foraminal stenosis at T10-11 due  to asymmetric facet arthrosis. 4. Decompressed lumbar spinal canal. Mild, diffuse lumbar spondylosis without stenosis.   Electronically Signed   By: Logan Bores   On: 11/29/2013 17:04    Thoracic Imaging: Thoracic CT w contrast:  Results for orders placed during the hospital encounter of 11/29/13  CT Thoracic Spine W Contrast   Narrative CLINICAL DATA:  Neck and back pain.  Bilateral lower extremity pain.  FLUOROSCOPY TIME:  1 min 45 seconds  PROCEDURE: LUMBAR PUNCTURE FOR CERVICAL LUMBAR AND THORACIC MYELOGRAM  CERVICAL AND LUMBAR AND THORACIC  MYELOGRAM  CT CERVICAL MYELOGRAM  CT LUMBAR MYELOGRAM  CT THORACIC MYELOGRAM  After thorough discussion of risks and benefits of the procedure including bleeding, infection, injury to nerves, blood vessels, adjacent structures as well as headache and CSF leak, written and oral informed consent was obtained. Consent was obtained by Dr. Logan Bores.  Patient was positioned prone on the fluoroscopy table. Local anesthesia was provided with 1% lidocaine without epinephrine after prepped and draped in the usual sterile fashion. Puncture was performed at L3-4 using a 3 1/2 inch 22-gauge spinal needle via a midline approach. Using a single pass through the dura, the needle was placed within the thecal sac, with return of clear CSF. 10 mL Omnipaque-300 was injected into the thecal sac, with normal opacification of the nerve roots and cauda equina consistent with free flow within the subarachnoid space. The patient was then moved to the trendelenburg position and contrast flowed into the Thoracic and Cervical spine regions.  I personally performed the lumbar puncture and administered the intrathecal contrast. I also personally supervised acquisition of the myelogram images.  TECHNIQUE: Contiguous axial images were obtained through the Cervical, Thoracic, and Lumbar spine after the intrathecal infusion of contrast. Coronal and sagittal reconstructions were obtained of the axial image sets.  FINDINGS: CERVICAL, THORACIC AND LUMBAR MYELOGRAM FINDINGS:  There is slight retrolisthesis of C5 on C6. Moderate disc space narrowing is present at C5-6 and C6-7 with moderate end plate osteophyte formation, resulting in moderate ventral epidural defects at each level. Thoracic vertebral alignment is normal. Vertebral body heights are preserved without evidence of compression fracture. Extensive posterior spinal fusion has been performed from T5 through the lumbar spine and sacrum with fixation  screws in the ilia as well. No thoracic spinal stenosis is identified. There is mild straightening of the normal lumbar lordosis without listhesis on upright neutral, flexion, and extension imaging. Mild disc space narrowing is present at L2-3 and L3-4. Dilated nerve root sleeves are noted bilaterally at S1.  CT CERVICAL MYELOGRAM FINDINGS:  There is straightening of the normal cervical lordosis. There is grade 1 retrolisthesis of C5 on C6 and C6 on C7, measuring approximately 4 mm each. Moderate disc space narrowing and endplate osteophyte formation are present at these 2 levels. The cervical spinal canal is capacious on a developmental basis. The cervical spinal cord is normal in caliber. Mild scarring is noted in the lung apices.  C2-3 through C4-5:  Negative.  C5-6: Broad-based posterior disc osteophyte complex mildly effaces the ventral thecal sac without spinal stenosis. Asymmetric right-sided uncovertebral arthrosis results in moderate right neural foraminal stenosis.  C6-7: Broad-based posterior disc osteophyte complex mildly effaces the ventral thecal sac without spinal canal stenosis. Spinal canal maintains an AP diameter of 10 mm. Uncovertebral arthrosis results in mild bilateral neural foraminal stenosis.  C7-T1: No disc herniation or stenosis. Small left-sided cervical rib which is fused with the proximal aspect of the first thoracic rib.  CT THORACIC MYELOGRAM FINDINGS:  Prior thoracolumbar fusion has been performed. Bilateral pedicle screws are present at T5, T6, T8, T10, and T12. The right T5 screw courses lateral to the pedicle and terminates lateral to the vertebral body. The left T6 screw traverses the lateral portion of the pedicle, terminating slightly lateral to the vertebral body. The right T12 screw partially traverses the right lateral recess. There is no evidence of screw loosening. Bilateral posterior fusion masses are present.  The thoracic  spinal canal is overall capacious. There is a small central disc protrusion at T7-8 which may contact the spinal cord without spinal stenosis or mass effect on the cord. Similar, small central disc protrusions are also seen T8-9 and T9-10. There is mild circumferential disc bulging at T10-11 without spinal stenosis. There is also mild disc bulging at T11-12, without evidence of spinal stenosis although evaluation is partially limited at this level due to dependent layering of contrast in the dorsal canal. There is mild right neural foraminal stenosis at T10-11 due to prominent facet arthrosis.  There are small bilateral pleural effusions. Dependent subsegmental atelectasis is present in both lower lobes.  CT LUMBAR MYELOGRAM FINDINGS:  There is mild straightening of the normal lumbar lordosis. There is no listhesis. Vertebral body heights are preserved. Mild disc space narrowing is present at L2-3 and L3-4. Thoracolumbar fusion hardware is again seen, with bilateral pedicle screws from L1-L5 as well as at S1. Screws also extend into the ilia. There is no evidence of screw loosening. 12 mm, well-circumscribed lucency in the L4 vertebral body may represent a hemangioma. Contrast was primarily layering dependently in the lower lumbar spine and sacral canal during initial supine imaging of the entire spine. Repeat imaging of the lumbar spine with the patient prone yielded better thecal sac opacification. Bilateral S1 and right S2 and S3 nerve root sleeves are dilated. Moderate atherosclerotic aortic calcification is noted. Punctate calculi are noted in both kidneys without hydronephrosis.  L1-2: Prior posterior decompression. No disc herniation or stenosis.  L2-3: Prior posterior decompression. Mild circumferential disc bulge without stenosis.  L3-4: Prior posterior decompression. Minimal disc bulging and endplate spurring without stenosis.  L4-5: Prior posterior decompression.  Small left foraminal disc protrusion without stenosis.  L5-S1: Prior posterior decompression. Mild disc bulge without stenosis.  IMPRESSION: 1. Moderate cervical spondylosis at C5-6 and C6-7 without cervical spinal stenosis. Uncovertebral arthrosis results in moderate right neural foraminal stenosis at C5-6 and mild bilateral neural foraminal stenosis at C6-7. 2. Prior posterior fusion from T5 through the sacrum as above. Right T12 screw partially traverses the right lateral recess. 3. Mild lower thoracic spondylosis without thoracic spinal stenosis. Mild right neural foraminal stenosis at T10-11 due to asymmetric facet arthrosis. 4. Decompressed lumbar spinal canal. Mild, diffuse lumbar spondylosis without stenosis.   Electronically Signed   By: Logan Bores   On: 11/29/2013 17:04    Thoracic DG 2-3 views:  Results for orders placed in visit on 01/06/15  DG Thoracic Spine 2 View   Lumbosacral Imaging: Lumbar CT wo contrast:  Results for orders placed during the hospital encounter of 09/22/15  CT LUMBAR SPINE WO CONTRAST   Narrative CLINICAL DATA:  Acute exacerbation of chronic back pain.  EXAM: CT LUMBAR SPINE WITHOUT CONTRAST  TECHNIQUE: Multidetector CT imaging of the lumbar spine was performed without intravenous contrast administration. Multiplanar CT image reconstructions were also generated.  COMPARISON:  Lumbar radiographs from 6 10/2015  FINDINGS: There has been previous posterior fusion using pedicle screws  and rods from the lower thoracic region to the sacrum and pelvis. Within limits for detection on noncontrast CT lumbar, I do not see an acute fracture, intraspinal hematoma, or lumbar compressive lesion. No gross hardware malposition. Solid posterior arthrodesis through least the L1 through S1 segments.  Aortic atherosclerosis. Renal cortical calcifications, incompletely evaluated. Suspected gallstones.  IMPRESSION: No acute findings status post  thoracosacral fusion.   Electronically Signed   By: Staci Righter M.D.   On: 09/25/2015 18:42    Lumbar CT w contrast:  Results for orders placed during the hospital encounter of 11/29/13  CT Lumbar Spine W Contrast   Narrative CLINICAL DATA:  Neck and back pain.  Bilateral lower extremity pain.  FLUOROSCOPY TIME:  1 min 45 seconds  PROCEDURE: LUMBAR PUNCTURE FOR CERVICAL LUMBAR AND THORACIC MYELOGRAM  CERVICAL AND LUMBAR AND THORACIC MYELOGRAM  CT CERVICAL MYELOGRAM  CT LUMBAR MYELOGRAM  CT THORACIC MYELOGRAM  After thorough discussion of risks and benefits of the procedure including bleeding, infection, injury to nerves, blood vessels, adjacent structures as well as headache and CSF leak, written and oral informed consent was obtained. Consent was obtained by Dr. Logan Bores.  Patient was positioned prone on the fluoroscopy table. Local anesthesia was provided with 1% lidocaine without epinephrine after prepped and draped in the usual sterile fashion. Puncture was performed at L3-4 using a 3 1/2 inch 22-gauge spinal needle via a midline approach. Using a single pass through the dura, the needle was placed within the thecal sac, with return of clear CSF. 10 mL Omnipaque-300 was injected into the thecal sac, with normal opacification of the nerve roots and cauda equina consistent with free flow within the subarachnoid space. The patient was then moved to the trendelenburg position and contrast flowed into the Thoracic and Cervical spine regions.  I personally performed the lumbar puncture and administered the intrathecal contrast. I also personally supervised acquisition of the myelogram images.  TECHNIQUE: Contiguous axial images were obtained through the Cervical, Thoracic, and Lumbar spine after the intrathecal infusion of contrast. Coronal and sagittal reconstructions were obtained of the axial image sets.  FINDINGS: CERVICAL, THORACIC AND LUMBAR MYELOGRAM  FINDINGS:  There is slight retrolisthesis of C5 on C6. Moderate disc space narrowing is present at C5-6 and C6-7 with moderate end plate osteophyte formation, resulting in moderate ventral epidural defects at each level. Thoracic vertebral alignment is normal. Vertebral body heights are preserved without evidence of compression fracture. Extensive posterior spinal fusion has been performed from T5 through the lumbar spine and sacrum with fixation screws in the ilia as well. No thoracic spinal stenosis is identified. There is mild straightening of the normal lumbar lordosis without listhesis on upright neutral, flexion, and extension imaging. Mild disc space narrowing is present at L2-3 and L3-4. Dilated nerve root sleeves are noted bilaterally at S1.  CT CERVICAL MYELOGRAM FINDINGS:  There is straightening of the normal cervical lordosis. There is grade 1 retrolisthesis of C5 on C6 and C6 on C7, measuring approximately 4 mm each. Moderate disc space narrowing and endplate osteophyte formation are present at these 2 levels. The cervical spinal canal is capacious on a developmental basis. The cervical spinal cord is normal in caliber. Mild scarring is noted in the lung apices.  C2-3 through C4-5:  Negative.  C5-6: Broad-based posterior disc osteophyte complex mildly effaces the ventral thecal sac without spinal stenosis. Asymmetric right-sided uncovertebral arthrosis results in moderate right neural foraminal stenosis.  C6-7: Broad-based posterior disc osteophyte complex mildly  effaces the ventral thecal sac without spinal canal stenosis. Spinal canal maintains an AP diameter of 10 mm. Uncovertebral arthrosis results in mild bilateral neural foraminal stenosis.  C7-T1: No disc herniation or stenosis. Small left-sided cervical rib which is fused with the proximal aspect of the first thoracic rib.  CT THORACIC MYELOGRAM FINDINGS:  Prior thoracolumbar fusion has been performed.  Bilateral pedicle screws are present at T5, T6, T8, T10, and T12. The right T5 screw courses lateral to the pedicle and terminates lateral to the vertebral body. The left T6 screw traverses the lateral portion of the pedicle, terminating slightly lateral to the vertebral body. The right T12 screw partially traverses the right lateral recess. There is no evidence of screw loosening. Bilateral posterior fusion masses are present.  The thoracic spinal canal is overall capacious. There is a small central disc protrusion at T7-8 which may contact the spinal cord without spinal stenosis or mass effect on the cord. Similar, small central disc protrusions are also seen T8-9 and T9-10. There is mild circumferential disc bulging at T10-11 without spinal stenosis. There is also mild disc bulging at T11-12, without evidence of spinal stenosis although evaluation is partially limited at this level due to dependent layering of contrast in the dorsal canal. There is mild right neural foraminal stenosis at T10-11 due to prominent facet arthrosis.  There are small bilateral pleural effusions. Dependent subsegmental atelectasis is present in both lower lobes.  CT LUMBAR MYELOGRAM FINDINGS:  There is mild straightening of the normal lumbar lordosis. There is no listhesis. Vertebral body heights are preserved. Mild disc space narrowing is present at L2-3 and L3-4. Thoracolumbar fusion hardware is again seen, with bilateral pedicle screws from L1-L5 as well as at S1. Screws also extend into the ilia. There is no evidence of screw loosening. 12 mm, well-circumscribed lucency in the L4 vertebral body may represent a hemangioma. Contrast was primarily layering dependently in the lower lumbar spine and sacral canal during initial supine imaging of the entire spine. Repeat imaging of the lumbar spine with the patient prone yielded better thecal sac opacification. Bilateral S1 and right S2 and S3 nerve root  sleeves are dilated. Moderate atherosclerotic aortic calcification is noted. Punctate calculi are noted in both kidneys without hydronephrosis.  L1-2: Prior posterior decompression. No disc herniation or stenosis.  L2-3: Prior posterior decompression. Mild circumferential disc bulge without stenosis.  L3-4: Prior posterior decompression. Minimal disc bulging and endplate spurring without stenosis.  L4-5: Prior posterior decompression. Small left foraminal disc protrusion without stenosis.  L5-S1: Prior posterior decompression. Mild disc bulge without stenosis.  IMPRESSION: 1. Moderate cervical spondylosis at C5-6 and C6-7 without cervical spinal stenosis. Uncovertebral arthrosis results in moderate right neural foraminal stenosis at C5-6 and mild bilateral neural foraminal stenosis at C6-7. 2. Prior posterior fusion from T5 through the sacrum as above. Right T12 screw partially traverses the right lateral recess. 3. Mild lower thoracic spondylosis without thoracic spinal stenosis. Mild right neural foraminal stenosis at T10-11 due to asymmetric facet arthrosis. 4. Decompressed lumbar spinal canal. Mild, diffuse lumbar spondylosis without stenosis.   Electronically Signed   By: Logan Bores   On: 11/29/2013 17:04    Lumbar DG 2-3 views:  Results for orders placed during the hospital encounter of 09/04/15  DG Lumbar Spine 2-3 Views   Narrative CLINICAL DATA:  Low back pain since Saturday.  EXAM: LUMBAR SPINE - 2-3 VIEW  COMPARISON:  CT abdomen 01/17/2015  FINDINGS: There is no evidence of lumbar  spine fracture. Alignment is anatomic. Mild degenerative disc disease at L2-3, L4-5 and L5-S1. Posterior lumbar fusion from T10 through S1 with bilateral ileal screws. No hardware failure or complication.  IMPRESSION: 1.  No acute osseous injury of the lumbar spine.   Electronically Signed   By: Kathreen Devoid   On: 09/04/2015 15:10    Note: Imaging reviewed.  Meds   The patient has a current medication list which includes the following prescription(s): ciprofloxacin, citalopram, ferrous sulfate, furosemide, gabapentin, lactulose, magnesium oxide, metformin, multivitamin with minerals, oxycodone hcl, oxycodone hcl, oxycodone hcl, oxycodone hcl, pantoprazole, phytonadione, spironolactone, and sucralfate.  Current Outpatient Prescriptions on File Prior to Visit  Medication Sig  . ciprofloxacin (CIPRO) 500 MG tablet Take 500 mg by mouth 2 (two) times a week. Pt takes on Monday and Friday.  . citalopram (CELEXA) 20 MG tablet Take 20 mg by mouth daily.  . ferrous sulfate 325 (65 FE) MG tablet Take 325 mg by mouth daily with breakfast.  . furosemide (LASIX) 20 MG tablet Take 1 tablet (20 mg total) by mouth 2 (two) times daily.  Marland Kitchen gabapentin (NEURONTIN) 300 MG capsule Take 300 mg by mouth 3 (three) times daily.  Marland Kitchen lactulose (CHRONULAC) 10 GM/15ML solution Take 10 g by mouth 3 (three) times daily.  . Magnesium Oxide 250 MG TABS Take 250 mg by mouth daily.  . metFORMIN (GLUCOPHAGE) 500 MG tablet Take 500 mg by mouth 2 (two) times daily with a meal.   . Multiple Vitamins-Minerals (MULTIVITAMIN WITH MINERALS) tablet Take 1 tablet by mouth daily.  . Oxycodone HCl 10 MG TABS Take 20 mg by mouth every 6 (six) hours as needed (for pain).  . pantoprazole (PROTONIX) 40 MG tablet Take 40 mg by mouth 2 (two) times daily before a meal.   . phytonadione (VITAMIN K) 5 MG tablet Take 1 tablet (5 mg total) by mouth 3 (three) times a week.  . spironolactone (ALDACTONE) 50 MG tablet Take 1 tablet (50 mg total) by mouth daily.  . sucralfate (CARAFATE) 1 GM/10ML suspension Take 1 g by mouth 4 (four) times daily -  with meals and at bedtime.   No current facility-administered medications on file prior to visit.    ROS  Constitutional: Denies any fever or chills Gastrointestinal: No reported hemesis, hematochezia, vomiting, or acute GI distress Musculoskeletal: Denies any acute  onset joint swelling, redness, loss of ROM, or weakness Neurological: No reported episodes of acute onset apraxia, aphasia, dysarthria, agnosia, amnesia, paralysis, loss of coordination, or loss of consciousness  Allergies  Ms. Ramanathan has No Known Allergies.  [place]  Medical:  Ms. Toppins  has a past medical history of Depression; Anxiety; High risk medications (not anticoagulants) long-term use; Vitamin D deficiency disease; Lumbago; Hypertension; Chronic hepatitis C ([place]); Alcohol abuse; Bronchitis; Arthropathy; Back pain; Shoulder pain; Stress headaches; Hyperglycemia; SOB (shortness of breath); Muscle spasm; Edema; Cirrhosis ([place]); Polyarthritis; Fatigue; Sinusitis; RLS (restless legs syndrome); Alcoholic cirrhosis of liver without ascites ([place]); Reflux; Fibromyalgia; Spine disorder; Chronic LBP (12/25/2014); DDD (degenerative disc disease), lumbar (01/01/2015); Lumbar radicular pain (01/01/2015); Neck pain (01/01/2015); Left leg pain (01/01/2015); Sacroiliac pain (01/01/2015); Upper back pain (01/01/2015); Urinary frequency; Anemia; and Diabetes mellitus without complication ([place]). Family: family history includes [name] in her father; Breast cancer (age of onset: 70) in her sister; Heart disease in her mother; Stroke in her mother. There is no history of Kidney disease or Bladder Cancer. Surgical:  has past surgical history that includes Tubal ligation; Tonsillectomy; Back surgery (12/19/2011);  Esophagogastroduodenoscopy (N/A, 04/14/2015); Esophagogastroduodenoscopy (egd) with propofol (N/A, 09/16/2015); and Colonoscopy with propofol (N/A, 09/25/2015). Tobacco:  reports that she has been smoking Cigarettes.  She has a 20 pack-year smoking history. She has never used smokeless tobacco. Alcohol:  reports that she does not drink alcohol. Drug:  reports that she does not use illicit drugs.  Constitutional Exam  Vitals: Blood pressure 118/69, pulse 84, temperature 98.6 F (37 C), temperature source Oral, resp.  rate 16, height '5\' 9"'$  (1.753 m), weight 171 lb (77.565 kg), SpO2 100 %. General appearance: Well nourished, well developed, and well hydrated. In no acute distress Calculated BMI/Body habitus: Body mass index is 25.24 kg/(m^2). (25-29.9 kg/m2) Overweight - 20% higher incidence of chronic pain Psych/Mental status: Alert and oriented x 3 (person, place, & time) Eyes: PERLA Respiratory: No evidence of acute respiratory distress  Cervical Spine Exam  Inspection: No masses, redness, or swelling Alignment: Symmetrical ROM: Functional: ROM is within functional limits [facility]) Stability: No instability detected Muscle strength & Tone: Functionally intact Sensory: Unimpaired Palpation: No complaints of tenderness  Upper Extremity (UE) Exam    Side: Right upper extremity  Side: Left upper extremity  Inspection: No masses, redness, swelling, or asymmetry  Inspection: No masses, redness, swelling, or asymmetry  ROM:  ROM:  Functional: ROM is within functional limits [facility])        Functional: ROM is within functional limits [facility])        Muscle strength & Tone: Functionally intact  Muscle strength & Tone: Functionally intact  Sensory: Unimpaired  Sensory: Unimpaired  Palpation: No complaints of tenderness  Palpation: No complaints of tenderness   Thoracic Spine Exam  Inspection: No masses, redness, or swelling Alignment: Symmetrical ROM: Functional: ROM is within functional limits [facility]) Stability: No instability detected Sensory: Unimpaired Muscle strength & Tone: Functionally intact Palpation: No complaints of tenderness  Lumbar Spine Exam  Inspection: No masses, redness, or swelling Alignment: Symmetrical ROM: Functional: ROM is within functional limits [facility]) Stability: No instability detected Muscle strength & Tone: Functionally intact Sensory: Unimpaired Palpation: No complaints of tenderness Provocative Tests: Lumbar Hyperextension and rotation test: deferred       Patrick's  Maneuver: deferred              Gait & Posture Assessment  Ambulation: Unassisted Gait: Unaffected Posture: WNL   Lower Extremity Exam    Side: Right lower extremity  Side: Left lower extremity  Inspection: No masses, redness, swelling, or asymmetry ROM:  Inspection: No masses, redness, swelling, or asymmetry ROM:  Functional: ROM is within functional limits [facility])        Functional: ROM is within functional limits [facility])        Muscle strength & Tone: Functionally intact  Muscle strength & Tone: Functionally intact  Sensory: Unimpaired  Sensory: Unimpaired  Palpation: No complaints of tenderness  Palpation: No complaints of tenderness   Assessment & Plan  Primary Diagnosis & Pertinent Problem List: The primary encounter diagnosis was Chronic pain. 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  Visit Diagnosis: 1. Chronic pain   2. Encounter for therapeutic drug level monitoring   3. Long term current use of opiate analgesic   4. Opiate use (120 MME/Day)  5. Neurogenic pain   6. Thrombocytopenia ([place])   7. S/P thoracolumbar fusion (T5-L5)   8. Grade 1 Retrolisthesis (4 mm) of C5 over C6 and C6 over C7   9. 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  No new assessment & plan notes have been filed under this hospital service since the last note was generated. 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) (Chronic)   Chronic pain - Primary (Chronic)   Relevant Medications   Oxycodone HCl 20 MG TABS   Oxycodone HCl 20 MG TABS   Oxycodone HCl 20 MG TABS   Grade 1 Retrolisthesis (4 mm) of C5 over C6 and C6 over C7 (Chronic)   Neurogenic pain (Chronic)   S/P thoracolumbar fusion (T5-L5) (Chronic)     Medium   Encounter for therapeutic drug level monitoring   Long term current use of opiate analgesic (Chronic)   Relevant Orders   ToxASSURE Select 13 (MW), Urine   Opiate use (120 MME/Day) (Chronic)     Low   Thrombocytopenia (HCC)       Pharmacotherapy (Medications Ordered): Meds ordered this encounter  Medications  . Oxycodone HCl 20 MG TABS    Sig: Take 1 tablet (20 mg total) by mouth every 6 (six) hours as needed.    Dispense:  120 tablet    Refill:  0    Do not add this medication to the electronic "Automatic Refill" notification system. Patient may have prescription filled one day early if pharmacy is closed on scheduled refill date. Do not fill until: 10/04/15 To last until: 11/03/15  . Oxycodone HCl 20 MG TABS    Sig: Take 1 tablet (20 mg total) by mouth every 6 (six) hours as needed.    Dispense:  120 tablet    Refill:  0    Do not add this medication to the electronic "Automatic Refill" notification system. Patient may have prescription filled one day early if pharmacy is closed on scheduled refill date. Do not fill until: 11/03/15 To last until: 12/03/15  . Oxycodone HCl 20 MG TABS    Sig: Take 1 tablet (20 mg total) by mouth every 6 (six) hours as needed.    Dispense:  120 tablet    Refill:  0    Do not add this medication to the  electronic "Automatic Refill" notification system. Patient may have prescription filled one day early if pharmacy is closed on scheduled refill date. Do not fill until: 12/03/15 To last until: 01/02/16    [facility] [address] & Procedure Ordered: Orders Placed This Encounter  Procedures  . ToxASSURE Select 13 (MW), Urine    Imaging Ordered: None  Interventional Therapies: Scheduled:  None at this time.    Considering:  None at this time.    PRN Procedures:  None at this time.    Referral(s) or Consult(s): None at this time.  New Prescriptions  OXYCODONE HCL 20 MG TABS    Take 1 tablet (20 mg total) by mouth every 6 (six) hours as needed.   OXYCODONE HCL 20 MG TABS    Take 1 tablet (20 mg total) by mouth every 6 (six) hours as needed.   OXYCODONE HCL 20 MG TABS    Take 1 tablet (20 mg total) by mouth every 6 (six) hours as needed.    Medications administered during this visit: Ms. Cassity had no medications administered during this visit.  Requested PM Follow-up: Return in 3 months (on 12/22/2015) for (3-Mo), Med-Mgmt.  Future Appointments Date Time Provider Joshua  12/22/2015 10:00 AM [name], MD [facility] None    Primary Care Physician: Volanda Napoleon, MD Location: [facility] Outpatient Pain Management Facility Note by: Kathlen Brunswick. Dossie Arbour, M.D, DABA, DABAPM, DABPM, DABIPP, FIPP  Pain Score Disclaimer: We use the NRS-11 scale. This is a self-reported, subjective measurement of pain severity with only modest accuracy. It is used primarily to identify changes within a particular patient. It must be understood that outpatient pain scales are significantly less accurate that those used for research, where they can be applied under ideal controlled circumstances with minimal exposure to variables. In reality, the score is likely to be a combination of pain intensity and pain affect, where pain affect describes the degree of emotional arousal or changes in action  readiness caused by the sensory experience of pain. Factors such as social and work situation, setting, emotional state, anxiety levels, expectation, and prior pain experience may influence pain perception and show large inter-individual differences that may also be affected by time variables.  Patient instructions provided during this appointment: There are no Patient Instructions on file for this visit.

## 2015-10-02 LAB — MISC LABCORP TEST (SEND OUT): Labcorp test code: 19588

## 2015-10-02 LAB — CYTOLOGY - NON PAP

## 2015-10-06 ENCOUNTER — Other Ambulatory Visit: Payer: Self-pay | Admitting: Gastroenterology

## 2015-10-06 DIAGNOSIS — K7031 Alcoholic cirrhosis of liver with ascites: Secondary | ICD-10-CM

## 2015-10-06 LAB — CULTURE, BODY FLUID-BOTTLE: Culture: NO GROWTH

## 2015-10-06 LAB — CULTURE, BODY FLUID W GRAM STAIN -BOTTLE

## 2015-10-07 ENCOUNTER — Other Ambulatory Visit
Admission: RE | Admit: 2015-10-07 | Discharge: 2015-10-07 | Disposition: A | Discharge status: Home / Self Care | Payer: Medicare Other | Source: Ambulatory Visit | Attending: Gastroenterology | Admitting: Gastroenterology

## 2015-10-07 DIAGNOSIS — K7031 Alcoholic cirrhosis of liver with ascites: Secondary | ICD-10-CM | POA: Diagnosis present

## 2015-10-07 LAB — APTT: aPTT: 33 seconds (ref 24–36)

## 2015-10-08 ENCOUNTER — Ambulatory Visit: Admission: RE | Admit: 2015-10-08 | Payer: Medicare Other | Source: Ambulatory Visit

## 2015-10-09 LAB — TOXASSURE SELECT 13 (MW), URINE: PDF: 0

## 2015-10-10 ENCOUNTER — Other Ambulatory Visit
Admission: RE | Admit: 2015-10-10 | Discharge: 2015-10-10 | Disposition: A | Discharge status: Home / Self Care | Payer: Medicare Other | Source: Ambulatory Visit | Attending: Gastroenterology | Admitting: Gastroenterology

## 2015-10-10 ENCOUNTER — Ambulatory Visit: Payer: Medicare Other

## 2015-10-10 ENCOUNTER — Ambulatory Visit
Admission: RE | Admit: 2015-10-10 | Discharge: 2015-10-10 | Disposition: A | Discharge status: Home / Self Care | Payer: Medicare Other | Source: Ambulatory Visit | Attending: Gastroenterology | Admitting: Gastroenterology

## 2015-10-10 DIAGNOSIS — K7031 Alcoholic cirrhosis of liver with ascites: Secondary | ICD-10-CM

## 2015-10-10 DIAGNOSIS — K7460 Unspecified cirrhosis of liver: Secondary | ICD-10-CM | POA: Diagnosis not present

## 2015-10-10 DIAGNOSIS — R188 Other ascites: Secondary | ICD-10-CM | POA: Insufficient documentation

## 2015-10-10 LAB — COMPREHENSIVE METABOLIC PANEL
ALT: 18 U/L (ref 14–54)
AST: 33 U/L (ref 15–41)
Albumin: 2.6 g/dL — ABNORMAL LOW (ref 3.5–5.0)
Alkaline Phosphatase: 90 U/L (ref 38–126)
Anion gap: 8 (ref 5–15)
BUN: 6 mg/dL (ref 6–20)
CO2: 24 mmol/L (ref 22–32)
Calcium: 8.5 mg/dL — ABNORMAL LOW (ref 8.9–10.3)
Chloride: 97 mmol/L — ABNORMAL LOW (ref 101–111)
Creatinine, Ser: 0.57 mg/dL (ref 0.44–1.00)
GFR calc Af Amer: >60 mL/min (ref >60)
GFR calc non Af Amer: >60 mL/min (ref >60)
Glucose, Bld: 119 mg/dL — ABNORMAL HIGH (ref 65–99)
Potassium: 3.9 mmol/L (ref 3.5–5.1)
Sodium: 129 mmol/L — ABNORMAL LOW (ref 135–145)
Total Bilirubin: 0.7 mg/dL (ref 0.3–1.2)
Total Protein: 5.9 g/dL — ABNORMAL LOW (ref 6.5–8.1)

## 2015-10-10 LAB — BODY FLUID CELL COUNT WITH DIFFERENTIAL
Eos, Fluid: 0 %
Lymphs, Fluid: 57 %
Monocyte-Macrophage-Serous Fluid: 27 %
Neutrophil Count, Fluid: 16 %
Other Cells, Fluid: 0 %
Total Nucleated Cell Count, Fluid: 324 cu mm

## 2015-10-10 LAB — PROTIME-INR
INR: 1.16
Prothrombin Time: 15 seconds (ref 11.4–15.0)

## 2015-10-10 LAB — ALBUMIN, FLUID (OTHER): Albumin, Fluid: 1 g/dL

## 2015-10-10 LAB — CBC
HCT: 32.4 % — ABNORMAL LOW (ref 35.0–47.0)
Hemoglobin: 10.8 g/dL — ABNORMAL LOW (ref 12.0–16.0)
MCH: 31.3 pg (ref 26.0–34.0)
MCHC: 33.4 g/dL (ref 32.0–36.0)
MCV: 93.5 fL (ref 80.0–100.0)
Platelets: 140 10*3/uL — ABNORMAL LOW (ref 150–440)
RBC: 3.47 MIL/uL — ABNORMAL LOW (ref 3.80–5.20)
RDW: 17.1 % — ABNORMAL HIGH (ref 11.5–14.5)
WBC: 6.2 10*3/uL (ref 3.6–11.0)

## 2015-10-10 LAB — APTT: aPTT: 34 seconds (ref 24–36)

## 2015-10-10 NOTE — Discharge Instructions (Signed)
Paracentesis, Care After Refer to this sheet in the next few weeks. These instructions provide you with information about caring for yourself after your procedure. Your health care provider may also give you more specific instructions. Your treatment has been planned according to current medical practices, but problems sometimes occur. Call your health care provider if you have any problems or questions after your procedure. WHAT TO EXPECT AFTER THE PROCEDURE After your procedure, it is common to have a small amount of clear fluid coming from the puncture site. HOME CARE INSTRUCTIONS  Return to your normal activities as told by your health care provider. Ask your health care provider what activities are safe for you.  Take over-the-counter and prescription medicines only as told by your health care provider.  Do not take baths, swim, or use a hot tub until your health care provider approves.  Follow instructions from your health care provider about:  How to take care of your puncture site.  When and how you should change your bandage (dressing).  When you should remove your dressing.  Check your puncture area every day signs of infection. Watch for:  Redness, swelling, or pain.  Fluid, blood, or pus.  Keep all follow-up visits as told by your health care provider. This is important. SEEK MEDICAL CARE IF:  You have redness, swelling, or pain at your puncture site.  You start to have more clear fluid coming from your puncture site.  You have blood or pus coming from your puncture site.  You have chills.  You have a fever. SEEK IMMEDIATE MEDICAL CARE IF:  You develop chest pain or shortness of breath.  You develop increasing pain, discomfort, or swelling in your abdomen.  You feel dizzy or light-headed or you pass out.   This information is not intended to replace advice given to you by your health care provider. Make sure you discuss any questions you have with your health  care provider.   Document Released: 07/30/2014 Document Reviewed: 07/30/2014 Elsevier Interactive Patient Education [employer].

## 2015-10-11 LAB — GRAM STAIN

## 2015-10-15 LAB — CULTURE, BODY FLUID-BOTTLE

## 2015-10-15 LAB — CYTOLOGY - NON PAP

## 2015-10-15 LAB — CULTURE, BODY FLUID W GRAM STAIN -BOTTLE: Culture: NO GROWTH

## 2015-10-22 ENCOUNTER — Other Ambulatory Visit: Payer: Self-pay | Admitting: Gastroenterology

## 2015-10-22 DIAGNOSIS — K7031 Alcoholic cirrhosis of liver with ascites: Secondary | ICD-10-CM

## 2015-10-29 ENCOUNTER — Ambulatory Visit
Admission: RE | Admit: 2015-10-29 | Discharge: 2015-10-29 | Disposition: A | Discharge status: Home / Self Care | Payer: Medicare Other | Source: Ambulatory Visit | Attending: Gastroenterology | Admitting: Gastroenterology

## 2015-10-29 DIAGNOSIS — K7031 Alcoholic cirrhosis of liver with ascites: Secondary | ICD-10-CM

## 2015-10-30 ENCOUNTER — Ambulatory Visit
Admission: RE | Admit: 2015-10-30 | Discharge: 2015-10-30 | Disposition: A | Discharge status: Home / Self Care | Payer: Medicare Other | Source: Ambulatory Visit | Attending: Gastroenterology | Admitting: Gastroenterology

## 2015-10-30 DIAGNOSIS — K7031 Alcoholic cirrhosis of liver with ascites: Secondary | ICD-10-CM | POA: Insufficient documentation

## 2015-10-30 DIAGNOSIS — K8020 Calculus of gallbladder without cholecystitis without obstruction: Secondary | ICD-10-CM | POA: Diagnosis not present

## 2015-11-06 ENCOUNTER — Encounter
Admission: EM | Disposition: A | Discharge status: Home / Self Care | Payer: Self-pay | Source: Home / Self Care | Attending: Surgery

## 2015-11-06 ENCOUNTER — Emergency Department: Payer: Medicare Other | Admitting: Anesthesiology

## 2015-11-06 ENCOUNTER — Inpatient Hospital Stay
Admission: EM | Admit: 2015-11-06 | Discharge: 2015-11-15 | DRG: 354 | Disposition: A | Discharge status: Home / Self Care | Payer: Medicare Other | Attending: Surgery | Admitting: Surgery

## 2015-11-06 ENCOUNTER — Emergency Department: Payer: Medicare Other

## 2015-11-06 DIAGNOSIS — B182 Chronic viral hepatitis C: Secondary | ICD-10-CM | POA: Diagnosis present

## 2015-11-06 DIAGNOSIS — F419 Anxiety disorder, unspecified: Secondary | ICD-10-CM | POA: Diagnosis present

## 2015-11-06 DIAGNOSIS — Z7984 Long term (current) use of oral hypoglycemic drugs: Secondary | ICD-10-CM

## 2015-11-06 DIAGNOSIS — F17210 Nicotine dependence, cigarettes, uncomplicated: Secondary | ICD-10-CM | POA: Diagnosis present

## 2015-11-06 DIAGNOSIS — K420 Umbilical hernia with obstruction, without gangrene: Secondary | ICD-10-CM | POA: Diagnosis present

## 2015-11-06 DIAGNOSIS — M797 Fibromyalgia: Secondary | ICD-10-CM | POA: Diagnosis present

## 2015-11-06 DIAGNOSIS — K429 Umbilical hernia without obstruction or gangrene: Secondary | ICD-10-CM | POA: Diagnosis present

## 2015-11-06 DIAGNOSIS — E871 Hypo-osmolality and hyponatremia: Secondary | ICD-10-CM | POA: Diagnosis not present

## 2015-11-06 DIAGNOSIS — K5900 Constipation, unspecified: Secondary | ICD-10-CM | POA: Diagnosis not present

## 2015-11-06 DIAGNOSIS — K7031 Alcoholic cirrhosis of liver with ascites: Secondary | ICD-10-CM | POA: Diagnosis present

## 2015-11-06 DIAGNOSIS — I10 Essential (primary) hypertension: Secondary | ICD-10-CM | POA: Diagnosis present

## 2015-11-06 DIAGNOSIS — Z66 Do not resuscitate: Secondary | ICD-10-CM | POA: Diagnosis present

## 2015-11-06 DIAGNOSIS — K439 Ventral hernia without obstruction or gangrene: Secondary | ICD-10-CM | POA: Insufficient documentation

## 2015-11-06 DIAGNOSIS — R0602 Shortness of breath: Secondary | ICD-10-CM | POA: Diagnosis present

## 2015-11-06 DIAGNOSIS — Z79899 Other long term (current) drug therapy: Secondary | ICD-10-CM | POA: Diagnosis not present

## 2015-11-06 DIAGNOSIS — E119 Type 2 diabetes mellitus without complications: Secondary | ICD-10-CM | POA: Diagnosis present

## 2015-11-06 DIAGNOSIS — J449 Chronic obstructive pulmonary disease, unspecified: Secondary | ICD-10-CM | POA: Diagnosis present

## 2015-11-06 HISTORY — PX: UMBILICAL HERNIA REPAIR: SHX196

## 2015-11-06 LAB — COMPREHENSIVE METABOLIC PANEL
ALT: 18 U/L (ref 14–54)
AST: 31 U/L (ref 15–41)
Albumin: 3.6 g/dL (ref 3.5–5.0)
Alkaline Phosphatase: 86 U/L (ref 38–126)
Anion gap: 10 (ref 5–15)
BUN: 6 mg/dL (ref 6–20)
CO2: 26 mmol/L (ref 22–32)
Calcium: 9.2 mg/dL (ref 8.9–10.3)
Chloride: 97 mmol/L — ABNORMAL LOW (ref 101–111)
Creatinine, Ser: 0.57 mg/dL (ref 0.44–1.00)
GFR calc Af Amer: >60 mL/min (ref >60)
GFR calc non Af Amer: >60 mL/min (ref >60)
Glucose, Bld: 152 mg/dL — ABNORMAL HIGH (ref 65–99)
Potassium: 3.8 mmol/L (ref 3.5–5.1)
Sodium: 133 mmol/L — ABNORMAL LOW (ref 135–145)
Total Bilirubin: 0.9 mg/dL (ref 0.3–1.2)
Total Protein: 7.7 g/dL (ref 6.5–8.1)

## 2015-11-06 LAB — CBC
HCT: 35.4 % (ref 35.0–47.0)
Hemoglobin: 12.1 g/dL (ref 12.0–16.0)
MCH: 30.3 pg (ref 26.0–34.0)
MCHC: 34.2 g/dL (ref 32.0–36.0)
MCV: 88.7 fL (ref 80.0–100.0)
Platelets: 108 10*3/uL — ABNORMAL LOW (ref 150–440)
RBC: 3.99 MIL/uL (ref 3.80–5.20)
RDW: 18.1 % — ABNORMAL HIGH (ref 11.5–14.5)
WBC: 7.3 10*3/uL (ref 3.6–11.0)

## 2015-11-06 LAB — LIPASE, BLOOD: Lipase: 32 U/L (ref 11–51)

## 2015-11-06 SURGERY — REPAIR, HERNIA, INGUINAL, INCARCERATED
Anesthesia: Choice

## 2015-11-06 SURGERY — REPAIR, HERNIA, UMBILICAL, ADULT
Anesthesia: General | Site: Abdomen | Wound class: Clean

## 2015-11-06 MED ORDER — ONDANSETRON HCL 4 MG/2ML IJ SOLN
INTRAMUSCULAR | Status: DC | PRN
  Administered 2015-11-06: 4 mg via INTRAVENOUS

## 2015-11-06 MED ORDER — BUPIVACAINE-EPINEPHRINE (PF) 0.5% -1:200000 IJ SOLN
INTRAMUSCULAR | Status: DC | PRN
  Administered 2015-11-06: 30 mL via PERINEURAL

## 2015-11-06 MED ORDER — HYDROMORPHONE HCL 1 MG/ML IJ SOLN
1.0000 mg | Freq: Once | INTRAMUSCULAR | Status: AC
  Administered 2015-11-06: 1 mg via INTRAVENOUS
  Filled 2015-11-06: qty 1

## 2015-11-06 MED ORDER — ONDANSETRON HCL 4 MG/2ML IJ SOLN: 4.0000 mg | Freq: Four times a day (QID) | INTRAMUSCULAR | Status: DC | PRN

## 2015-11-06 MED ORDER — CITALOPRAM HYDROBROMIDE 20 MG PO TABS
20.0000 mg | ORAL_TABLET | Freq: Every day | ORAL | Status: DC
  Administered 2015-11-07 – 2015-11-15 (×9): 20 mg via ORAL
  Filled 2015-11-06 (×9): qty 1

## 2015-11-06 MED ORDER — PHYTONADIONE 5 MG PO TABS
5.0000 mg | ORAL_TABLET | ORAL | Status: DC
  Administered 2015-11-10 – 2015-11-14 (×3): 5 mg via ORAL
  Filled 2015-11-06 (×5): qty 1

## 2015-11-06 MED ORDER — LIDOCAINE HCL (CARDIAC) 20 MG/ML IV SOLN
INTRAVENOUS | Status: DC | PRN
  Administered 2015-11-06: 100 mg via INTRAVENOUS

## 2015-11-06 MED ORDER — LACTULOSE 10 GM/15ML PO SOLN
10.0000 g | Freq: Three times a day (TID) | ORAL | Status: DC
  Administered 2015-11-07 – 2015-11-15 (×25): 10 g via ORAL
  Filled 2015-11-06 (×24): qty 30

## 2015-11-06 MED ORDER — GABAPENTIN 300 MG PO CAPS
300.0000 mg | ORAL_CAPSULE | Freq: Three times a day (TID) | ORAL | Status: DC
  Administered 2015-11-07 – 2015-11-15 (×25): 300 mg via ORAL
  Filled 2015-11-06 (×25): qty 1

## 2015-11-06 MED ORDER — ONDANSETRON HCL 4 MG/2ML IJ SOLN: 4.0000 mg | Freq: Once | INTRAMUSCULAR | Status: DC | PRN

## 2015-11-06 MED ORDER — FENTANYL CITRATE (PF) 100 MCG/2ML IJ SOLN
INTRAMUSCULAR | Status: DC | PRN
  Administered 2015-11-06: 50 ug via INTRAVENOUS
  Administered 2015-11-06: 150 ug via INTRAVENOUS
  Administered 2015-11-06: 50 ug via INTRAVENOUS

## 2015-11-06 MED ORDER — GLYCOPYRROLATE 0.2 MG/ML IJ SOLN
INTRAMUSCULAR | Status: DC | PRN
  Administered 2015-11-06: 0.2 mg via INTRAVENOUS

## 2015-11-06 MED ORDER — NEOSTIGMINE METHYLSULFATE 10 MG/10ML IV SOLN
INTRAVENOUS | Status: DC | PRN
  Administered 2015-11-06: 1.5 mg via INTRAVENOUS

## 2015-11-06 MED ORDER — CEFAZOLIN SODIUM-DEXTROSE 2-4 GM/100ML-% IV SOLN
2.0000 g | INTRAVENOUS | Status: DC
  Filled 2015-11-06: qty 100

## 2015-11-06 MED ORDER — FENTANYL CITRATE (PF) 100 MCG/2ML IJ SOLN
INTRAMUSCULAR | Status: AC
  Filled 2015-11-06: qty 2

## 2015-11-06 MED ORDER — DIATRIZOATE MEGLUMINE & SODIUM 66-10 % PO SOLN
15.0000 mL | Freq: Once | ORAL | Status: AC
  Administered 2015-11-06: 15 mL via ORAL

## 2015-11-06 MED ORDER — HYDROMORPHONE HCL 1 MG/ML IJ SOLN
INTRAMUSCULAR | Status: AC
  Administered 2015-11-06: 1 mg via INTRAVENOUS
  Filled 2015-11-06: qty 1

## 2015-11-06 MED ORDER — LACTATED RINGERS IV SOLN
INTRAVENOUS | Status: DC | PRN
  Administered 2015-11-06: 19:00:00 via INTRAVENOUS

## 2015-11-06 MED ORDER — PANTOPRAZOLE SODIUM 40 MG PO TBEC
40.0000 mg | DELAYED_RELEASE_TABLET | Freq: Two times a day (BID) | ORAL | Status: DC
  Administered 2015-11-07 – 2015-11-15 (×17): 40 mg via ORAL
  Filled 2015-11-06 (×18): qty 1

## 2015-11-06 MED ORDER — SODIUM CHLORIDE 0.9% FLUSH: 3.0000 mL | INTRAVENOUS | Status: DC | PRN

## 2015-11-06 MED ORDER — SODIUM CHLORIDE 0.9 % IV SOLN
250.0000 mL | INTRAVENOUS | Status: DC | PRN
  Administered 2015-11-09: 250 mL via INTRAVENOUS

## 2015-11-06 MED ORDER — IOPAMIDOL (ISOVUE-300) INJECTION 61%
100.0000 mL | Freq: Once | INTRAVENOUS | Status: AC | PRN
  Administered 2015-11-06: 100 mL via INTRAVENOUS

## 2015-11-06 MED ORDER — FENTANYL CITRATE (PF) 100 MCG/2ML IJ SOLN
25.0000 ug | INTRAMUSCULAR | Status: AC | PRN
  Administered 2015-11-06 (×6): 25 ug via INTRAVENOUS

## 2015-11-06 MED ORDER — SODIUM CHLORIDE 0.9% FLUSH
3.0000 mL | Freq: Two times a day (BID) | INTRAVENOUS | Status: DC
  Administered 2015-11-09 – 2015-11-15 (×10): 3 mL via INTRAVENOUS

## 2015-11-06 MED ORDER — SUCCINYLCHOLINE CHLORIDE 20 MG/ML IJ SOLN
INTRAMUSCULAR | Status: DC | PRN
  Administered 2015-11-06: 70 mg via INTRAVENOUS

## 2015-11-06 MED ORDER — MORPHINE SULFATE (PF) 4 MG/ML IV SOLN
4.0000 mg | INTRAVENOUS | Status: DC | PRN
  Administered 2015-11-06 – 2015-11-13 (×10): 4 mg via INTRAVENOUS
  Filled 2015-11-06 (×10): qty 1

## 2015-11-06 MED ORDER — MIDAZOLAM HCL 2 MG/2ML IJ SOLN
INTRAMUSCULAR | Status: DC | PRN
  Administered 2015-11-06: 1 mg via INTRAVENOUS

## 2015-11-06 MED ORDER — ONDANSETRON HCL 4 MG PO TABS: 4.0000 mg | ORAL_TABLET | Freq: Four times a day (QID) | ORAL | Status: DC | PRN

## 2015-11-06 MED ORDER — METFORMIN HCL 500 MG PO TABS
500.0000 mg | ORAL_TABLET | Freq: Two times a day (BID) | ORAL | Status: DC
  Administered 2015-11-07 – 2015-11-15 (×17): 500 mg via ORAL
  Filled 2015-11-06 (×17): qty 1

## 2015-11-06 MED ORDER — THIAMINE HCL 100 MG/ML IJ SOLN
100.0000 mg | Freq: Every day | INTRAMUSCULAR | Status: DC
  Administered 2015-11-07 – 2015-11-09 (×3): 100 mg via INTRAVENOUS
  Filled 2015-11-06 (×4): qty 2

## 2015-11-06 MED ORDER — MORPHINE SULFATE (PF) 2 MG/ML IV SOLN
2.0000 mg | Freq: Once | INTRAVENOUS | Status: AC
  Administered 2015-11-06: 2 mg via INTRAVENOUS

## 2015-11-06 MED ORDER — HYDROMORPHONE HCL 1 MG/ML IJ SOLN
1.0000 mg | INTRAMUSCULAR | Status: AC
  Administered 2015-11-06: 1 mg via INTRAVENOUS

## 2015-11-06 MED ORDER — SPIRONOLACTONE 25 MG PO TABS
50.0000 mg | ORAL_TABLET | Freq: Every day | ORAL | Status: DC
  Administered 2015-11-07 – 2015-11-15 (×9): 50 mg via ORAL
  Filled 2015-11-06 (×6): qty 2
  Filled 2015-11-06: qty 1
  Filled 2015-11-06 (×3): qty 2

## 2015-11-06 MED ORDER — CIPROFLOXACIN HCL 500 MG PO TABS
500.0000 mg | ORAL_TABLET | ORAL | Status: DC
  Administered 2015-11-10 – 2015-11-13 (×2): 500 mg via ORAL
  Filled 2015-11-06 (×5): qty 1

## 2015-11-06 MED ORDER — LORAZEPAM 2 MG/ML IJ SOLN
1.0000 mg | Freq: Once | INTRAMUSCULAR | Status: AC
  Administered 2015-11-06: 1 mg via INTRAVENOUS
  Filled 2015-11-06: qty 1

## 2015-11-06 MED ORDER — MAGNESIUM OXIDE 400 (241.3 MG) MG PO TABS
400.0000 mg | ORAL_TABLET | Freq: Every day | ORAL | Status: DC
  Administered 2015-11-07 – 2015-11-15 (×9): 400 mg via ORAL
  Filled 2015-11-06 (×9): qty 1

## 2015-11-06 MED ORDER — SUCRALFATE 1 GM/10ML PO SUSP
1.0000 g | Freq: Three times a day (TID) | ORAL | Status: DC
  Administered 2015-11-07 – 2015-11-15 (×25): 1 g via ORAL
  Filled 2015-11-06 (×25): qty 10

## 2015-11-06 MED ORDER — FUROSEMIDE 20 MG PO TABS
20.0000 mg | ORAL_TABLET | Freq: Two times a day (BID) | ORAL | Status: DC
  Administered 2015-11-07 – 2015-11-09 (×5): 20 mg via ORAL
  Filled 2015-11-06 (×7): qty 1

## 2015-11-06 MED ORDER — ROCURONIUM BROMIDE 100 MG/10ML IV SOLN
INTRAVENOUS | Status: DC | PRN
  Administered 2015-11-06: 20 mg via INTRAVENOUS

## 2015-11-06 MED ORDER — ADULT MULTIVITAMIN W/MINERALS CH
1.0000 | ORAL_TABLET | Freq: Every day | ORAL | Status: DC
  Administered 2015-11-07 – 2015-11-14 (×2): 1 via ORAL
  Filled 2015-11-06 (×8): qty 1

## 2015-11-06 MED ORDER — ONDANSETRON HCL 4 MG/2ML IJ SOLN
4.0000 mg | Freq: Once | INTRAMUSCULAR | Status: AC
  Administered 2015-11-06: 4 mg via INTRAVENOUS
  Filled 2015-11-06: qty 2

## 2015-11-06 MED ORDER — MORPHINE SULFATE (PF) 2 MG/ML IV SOLN
2.0000 mg | INTRAVENOUS | Status: DC | PRN
  Administered 2015-11-06: 2 mg via INTRAVENOUS
  Filled 2015-11-06 (×2): qty 1

## 2015-11-06 MED ORDER — KETAMINE HCL 50 MG/ML IJ SOLN
INTRAMUSCULAR | Status: DC | PRN
  Administered 2015-11-06: 25 mg via INTRAVENOUS

## 2015-11-06 SURGICAL SUPPLY — 40 items
ADH LQ OCL WTPRF AMP STRL LF (MISCELLANEOUS) ×1
ADHESIVE MASTISOL STRL (MISCELLANEOUS) ×3 IMPLANT
BINDER ABDOMINAL 12 ML 46-62 (SOFTGOODS) ×2 IMPLANT
CANISTER SUCT 1200ML W/VALVE (MISCELLANEOUS) ×3 IMPLANT
CHLORAPREP W/TINT 26ML (MISCELLANEOUS) ×3 IMPLANT
CLOSURE WOUND 1/2 X4 (GAUZE/BANDAGES/DRESSINGS) ×1
DRAPE LAPAROTOMY 100X77 ABD (DRAPES) ×3 IMPLANT
ELECT REM PT RETURN 9FT ADLT (ELECTROSURGICAL) ×3
ELECTRODE REM PT RTRN 9FT ADLT (ELECTROSURGICAL) ×1 IMPLANT
GAUZE SPONGE 4X4 12PLY STRL (GAUZE/BANDAGES/DRESSINGS) ×2 IMPLANT
GAUZE SPONGE NON-WVN 2X2 STRL (MISCELLANEOUS) ×2 IMPLANT
GLOVE BIO SURGEON STRL SZ8 (GLOVE) ×8 IMPLANT
GOWN STRL REUS W/ TWL LRG LVL3 (GOWN DISPOSABLE) ×2 IMPLANT
GOWN STRL REUS W/TWL LRG LVL3 (GOWN DISPOSABLE) ×6
HANDLE SUCTION POOLE (INSTRUMENTS) IMPLANT
JACKSON PRATT 10 (INSTRUMENTS) ×4 IMPLANT
LABEL OR SOLS (LABEL) ×3 IMPLANT
NDL SAFETY 22GX1.5 (NEEDLE) ×3 IMPLANT
NS IRRIG 500ML POUR BTL (IV SOLUTION) ×3 IMPLANT
PACK BASIN MINOR ARMC (MISCELLANEOUS) ×3 IMPLANT
PAD ABD DERMACEA PRESS 5X9 (GAUZE/BANDAGES/DRESSINGS) ×2 IMPLANT
SLEEVE SCD COMPRESS THIGH MED (MISCELLANEOUS) ×2 IMPLANT
SPONGE DRAIN TRACH 4X4 STRL 2S (GAUZE/BANDAGES/DRESSINGS) ×2 IMPLANT
SPONGE LAP 18X18 5 PK (GAUZE/BANDAGES/DRESSINGS) ×3 IMPLANT
SPONGE VERSALON 2X2 STRL (MISCELLANEOUS) ×6
SPONGE VERSALON 4X4 4PLY (MISCELLANEOUS) ×4 IMPLANT
STAPLER SKIN PROX 35W (STAPLE) ×2 IMPLANT
STRIP CLOSURE SKIN 1/2X4 (GAUZE/BANDAGES/DRESSINGS) ×2 IMPLANT
SUCTION POOLE HANDLE (INSTRUMENTS) ×3
SUT ETHIBOND 0 (SUTURE) ×9 IMPLANT
SUT ETHIBOND NAB CT1 #1 30IN (SUTURE) ×3 IMPLANT
SUT MNCRL 4-0 (SUTURE) ×3
SUT MNCRL 4-0 27XMFL (SUTURE) ×1
SUT PROLENE 0 CT 1 30 (SUTURE) ×4 IMPLANT
SUT PROLENE 1 CT 1 30 (SUTURE) ×4 IMPLANT
SUT VIC AB 2-0 CT1 (SUTURE) ×6 IMPLANT
SUT VIC AB 3-0 SH 27 (SUTURE) ×3
SUT VIC AB 3-0 SH 27X BRD (SUTURE) ×1 IMPLANT
SUTURE MNCRL 4-0 27XMF (SUTURE) ×1 IMPLANT
SYRINGE 10CC LL (SYRINGE) ×3 IMPLANT

## 2015-11-06 NOTE — ED Triage Notes (Signed)
Pt arrives to ER via [facility] c/o abdominal pain related to umbilical hernia. Pt has been dealing with this hernia X 1 month. Pt was sitting in chair at home, noticed hernia getting larger and hard. Pt given 161mg fentanyl and '4mg'$  Zofran IV en route with EMS. Nauseated. Pt alert and oriented X4, active, cooperative, pt in mild distress. RR even and unlabored, color WNL.

## 2015-11-06 NOTE — ED Notes (Signed)
MD at bedside to manually palpate and apply pressure to hernia. Pt hernia much softer and smaller. Pt states burning pain is gone. Pain 7/10

## 2015-11-06 NOTE — Anesthesia Procedure Notes (Signed)
Procedure Name: Intubation Date/Time: 11/06/2015 6:43 PM Performed by: Rosaria Ferries, Jessamy Torosyan Pre-anesthesia Checklist: Patient identified, Emergency Drugs available, Suction available and Patient being monitored Patient Re-evaluated:Patient Re-evaluated prior to inductionOxygen Delivery Method: Circle system utilized Preoxygenation: Pre-oxygenation with 100% oxygen Intubation Type: IV induction Laryngoscope Size: Mac and 4 Grade View: Grade I Tube size: 7.0 mm Number of attempts: 1 Placement Confirmation: ETT inserted through vocal cords under direct vision,  positive ETCO2 and breath sounds checked- equal and bilateral Secured at: 21 cm Tube secured with: Tape Dental Injury: Teeth and Oropharynx as per pre-operative assessment

## 2015-11-06 NOTE — H&P (Signed)
Audrey Garrett is an 55 y.o. female.    Chief Complaint: Periumbilical abdominal pain  HPI: This a patient with periumbilical abdominal pain that started this morning approximately 9 AM worsened about 1:00 and she was diagnosed with an incarcerated umbilical hernia which is nonreducible the emergency room physician thought that he may have her do some of it but subsequent to that she had a CT scan showing bowel and she's not been able to have it reduced since then she's had extreme pain nausea vomiting she last ate this morning.  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 Alcohol abuse   . Alcoholic cirrhosis of liver without ascites ([place])   . Anemia   . Anxiety   . Arthropathy   . Back pain   . Bronchitis   . Chronic hepatitis C ([place])   . Chronic LBP 12/25/2014   Overview:  Post extensive surgery   . Cirrhosis ([place])   . DDD (degenerative disc disease), lumbar 01/01/2015  . Depression   . Diabetes mellitus without complication ([place])   . Edema   . Fatigue   . Fibromyalgia   . High risk medications (not anticoagulants) long-term use   . Hyperglycemia   . Hypertension   . Left leg pain 01/01/2015  . Lumbago   . Lumbar radicular pain 01/01/2015  . Muscle spasm   . Neck pain 01/01/2015  . Polyarthritis   . Reflux   . RLS (restless legs syndrome)   . Sacroiliac pain 01/01/2015  . Shoulder pain   . Sinusitis   . SOB (shortness of breath)   . Spine disorder   . Stress headaches   . Upper back pain 01/01/2015  . Urinary frequency   . Vitamin D deficiency disease     Past Surgical History:  Procedure Laterality Date  . BACK SURGERY  12/19/2011  . COLONOSCOPY WITH PROPOFOL N/A  09/25/2015   Procedure: COLONOSCOPY WITH PROPOFOL;  Surgeon: Lollie Sails, MD;  Location: [facility] ENDOSCOPY;  Service: Endoscopy;  Laterality: N/A;  . ESOPHAGOGASTRODUODENOSCOPY N/A 04/14/2015   Procedure: ESOPHAGOGASTRODUODENOSCOPY (EGD);  Surgeon: Lollie Sails, MD;  Location: [facility] ENDOSCOPY;  Service: Endoscopy;  Laterality: N/A;  . ESOPHAGOGASTRODUODENOSCOPY (EGD) WITH PROPOFOL N/A 09/16/2015   Procedure: ESOPHAGOGASTRODUODENOSCOPY (EGD) WITH PROPOFOL;  Surgeon: Lollie Sails, MD;  Location[facility] ENDOSCOPY;  Service: Endoscopy;  Laterality: N/A;  . TONSILLECTOMY    . TUBAL LIGATION      Family History  Problem Relation Age of Onset  . Stroke Mother   . Heart disease Mother   . [name] Father   . Breast cancer Sister 69  . Kidney disease Neg Hx   . Bladder Cancer Neg Hx    Social History:  reports that she has been smoking Cigarettes.  She has a 20.00 pack-year smoking history. She has never used smokeless tobacco. She reports that she does not drink alcohol or use drugs.  Allergies: No Known Allergies   (Not in a hospital admission)   Review of Systems  Constitutional: Negative for chills and fever.  HENT: Negative.   Eyes: Negative.   Respiratory: Negative.   Cardiovascular: Negative.   Gastrointestinal: Positive for abdominal pain, nausea and vomiting.  Negative for diarrhea and heartburn.  Genitourinary: Negative.   Musculoskeletal: Negative.   Skin: Negative.   Neurological: Negative.   Endo/Heme/Allergies: Negative.   Psychiatric/Behavioral: Negative.      Physical Exam:  BP 118/75   Pulse 90   Temp 98.4 F (36.9 C) (Oral)   Resp 13   Ht _0  (1.753 m)   Wt 148 lb (67.1 kg)   SpO2 99%   BMI 21.86 kg/m   Physical Exam  Constitutional: She is oriented to person, place, and time. She appears distressed.  Very uncomfortable-appearing thin female patient prefers a lay flat and is holding her belly with both hands  HENT:  Head: Normocephalic and  atraumatic.  Eyes: Pupils are equal, round, and reactive to light. Right eye exhibits discharge. Left eye exhibits no discharge. Scleral icterus is present.  Neck: Normal range of motion.  Cardiovascular: Normal rate, regular rhythm and normal heart sounds.   Pulmonary/Chest: Effort normal and breath sounds normal. No respiratory distress. She has no wheezes. She has no rales.  Abdominal: She exhibits mass. She exhibits no distension. There is tenderness. There is no rebound and no guarding.  Umbilical hernia with incarcerated small bowel present with discoloration of the skin extremely tender to touch and in ability to reduce.  Musculoskeletal: Normal range of motion. She exhibits no edema, tenderness or deformity.  Apparent muscle wasting  Lymphadenopathy:    She has no cervical adenopathy.  Neurological: She is alert and oriented to person, place, and time.  Skin: Skin is warm and dry. No rash noted. She is not diaphoretic. There is erythema.  Psychiatric: Mood and affect normal.  Vitals reviewed.       Results for orders placed or performed during the hospital encounter of 11/06/15 (from the past 48 hour(s))  Lipase, blood     Status: None   Collection Time: 11/06/15  2:34 PM  Result Value Ref Range   Lipase 32 11 - 51 U/L  Comprehensive metabolic panel     Status: Abnormal   Collection Time: 11/06/15  2:34 PM  Result Value Ref Range   Sodium 133 (L) 135 - 145 mmol/L   Potassium 3.8 3.5 - 5.1 mmol/L   Chloride 97 (L) 101 - 111 mmol/L   CO2 26 22 - 32 mmol/L   Glucose, Bld 152 (H) 65 - 99 mg/dL   BUN 6 6 - 20 mg/dL   Creatinine, Ser 0.57 0.44 - 1.00 mg/dL   Calcium 9.2 8.9 - 10.3 mg/dL   Total Protein 7.7 6.5 - 8.1 g/dL   Albumin 3.6 3.5 - 5.0 g/dL   AST 31 15 - 41 U/L   ALT 18 14 - 54 U/L   Alkaline Phosphatase 86 38 - 126 U/L   Total Bilirubin 0.9 0.3 - 1.2 mg/dL   GFR calc non Af Amer >60 >60 mL/min   GFR calc Af Amer >60 >60 mL/min    Comment: (NOTE) The eGFR has  been calculated using the CKD EPI equation. This calculation has not been validated in all clinical situations. eGFR's persistently <60 mL/min signify possible Chronic Kidney Disease.    Anion gap 10 5 - 15  CBC     Status: Abnormal   Collection Time: 11/06/15  2:34 PM  Result Value Ref Range   WBC 7.3 3.6 - 11.0 K/uL   RBC 3.99 3.80 - 5.20 MIL/uL   Hemoglobin 12.1 12.0 - 16.0 g/dL   HCT 35.4 35.0 - 47.0 %   MCV  88.7 80.0 - 100.0 fL   MCH 30.3 26.0 - 34.0 pg   MCHC 34.2 32.0 - 36.0 g/dL   RDW 18.1 (H) 11.5 - 14.5 %   Platelets 108 (L) 150 - 440 K/uL   Ct Abdomen Pelvis W Contrast  Result Date: 11/06/2015 CLINICAL DATA:  Abdominal pain.  Umbilical hernia. EXAM: CT ABDOMEN AND PELVIS WITH CONTRAST TECHNIQUE: Multidetector CT imaging of the abdomen and pelvis was performed using the standard protocol following bolus administration of intravenous contrast. CONTRAST:  133m ISOVUE-300 IOPAMIDOL (ISOVUE-300) INJECTION 61% COMPARISON:  CT scan dated 01/17/2015 FINDINGS: Lower chest:  Normal. Hepatobiliary: The liver has an irregular contour with a prominent left lobe and a prominent caudate lobe consistent with cirrhosis. No masses. Numerous small stones in the gallbladder. No biliary ductal dilatation. Pancreas: No mass, inflammatory changes, or other significant abnormality. Spleen: Within normal limits in size and appearance. Adrenals/Urinary Tract: No masses identified. Normal kidneys and adrenal glands and bladder. No evidence of hydronephrosis. Stomach/Bowel: There is an umbilical hernia containing mid small bowel. There is contrast in the small bowel up to the hernia but contrast does not pass through the slightly dilated bowel in the umbilical hernia. Proximal small bowel is dilated. Distal small bowel is normal. Terminal ileum and appendix are normal. Scattered diverticula in the otherwise normal colon. Vascular/Lymphatic: Aortic atherosclerosis.  No adenopathy. Reproductive: No mass or other  significant abnormality. Other: Extensive ascites. Musculoskeletal: No acute abnormality. The spine is fused from the upper thoracic spine through S1. IMPRESSION: Partial small bowel obstruction secondary to probable incarcerated umbilical hernia. Cirrhosis with extensive ascites. Cholelithiasis, chronic. Aortic atherosclerosis. Electronically Signed   By: JLorriane ShireM.D.   On: 11/06/2015 15:55     Assessment/Plan  This patient with multiple medical problems mostly related to alcoholic cirrhosis and hepatitis C. She has an incarcerated umbilical hernia which is not reducible and will require admission to the hospital and emergent surgical intervention.  I discussed with she and her significant other the rationale for offering this surgery in fact I suggested the possibility of placing an nasogastric tube but I do not think that that will work in this patient with a very tight umbilical hernia and incarceration.  I discussed with them the rationale for offering surgery and the extreme risk of complication and even death associated with this. I discussed the risk of cardiovascular events and the risk of bleeding from varices postoperative hemorrhage postoperative ascites leak drain placement to decrease the risk of ascites leak ascites infection bowel resection with anastomotic leak and sepsis and cosmetic deformity.  Questions were answered for them she wants to proceed with surgery now because of extreme pain and is willing to accept the extreme risk from the surgery.  I also discussed with them resuscitation status had not discussed that before when I described in detail with the RN present and with the patient's daughter on the telephone by speaker phone she voiced twice that she wished to be DO NOT RESUSCITATE I described for her what this means and what medications or therapies would be withheld and she understood that and wishes to remain DO NOT RESUSCITATE.  Multiple questions were answered  for them they understood and agreed with this plan  RFlorene Glen MD, FACS

## 2015-11-06 NOTE — ED Notes (Signed)
Pt drinking oral CT contrast as instructed by CT staff. Husband at bedside to assist patient. Pt instructed to let RN know when finished.

## 2015-11-06 NOTE — ED Notes (Addendum)
Dr. Burt Knack at bedside to explain to patient surgery. Pt gives consent. Consent signed. Husband at bedside to witness.

## 2015-11-06 NOTE — ED Notes (Signed)
  Pt hernia is protruding again, moderate discoloration to site, hernia is now hardened and tender to touch.

## 2015-11-06 NOTE — ED Provider Notes (Signed)
Time Seen: Approximately 1435  I have reviewed the triage notes  Chief Complaint: Umbilical Hernia   History of Present Illness: Audrey Garrett is a 55 y.o. female *who arrives with a history of an umbilical hernia which she states normally she "" slides in and out "". Has sudden onset of pain and the umbilical hernia is significantly uncomfortable and describes it as a burning discomfort. This started acutely at 12:30 today and the patient took EMS transport here to emergency department she received fentanyl and Zofran prior to arrival. She still having significant pain from the hernia site.   Past Medical History:  Diagnosis Date  . Alcohol abuse   . Alcoholic cirrhosis of liver without ascites ([place])   . Anemia   . Anxiety   . Arthropathy   . Back pain   . Bronchitis   . Chronic hepatitis C ([place])   . Chronic LBP 12/25/2014   Overview:  Post extensive surgery   . Cirrhosis ([place])   . DDD (degenerative disc disease), lumbar 01/01/2015  . Depression   . Diabetes mellitus without complication ([place])   . Edema   . Fatigue   . Fibromyalgia   . High risk medications (not anticoagulants) long-term use   . Hyperglycemia   . Hypertension   . Left leg pain 01/01/2015  . Lumbago   . Lumbar radicular pain 01/01/2015  . Muscle spasm   . Neck pain 01/01/2015  . Polyarthritis   . Reflux   . RLS (restless legs syndrome)   . Sacroiliac pain 01/01/2015  . Shoulder pain   . Sinusitis   . SOB (shortness of breath)   . Spine disorder   . Stress headaches   . Upper back pain 01/01/2015  . Urinary frequency   . Vitamin D deficiency disease     Patient Active Problem List   Diagnosis Date Noted  . Neurogenic pain 10/01/2015  . S/P thoracolumbar fusion (T5-L5) 10/01/2015  . Grade 1 Retrolisthesis (4 mm) of C5 over C6 and C6 over C7 10/01/2015  . Cervical foraminal stenosis (Left C5-6; Bilateral C6-7) 10/01/2015  . GI bleed 09/22/2015  . Depression, major, recurrent, moderate ([place])  05/27/2015  . Alcohol abuse 05/27/2015  . Abdominal pain, chronic, epigastric 05/27/2015  . Difficulty urinating 05/26/2015  . Nephrolithiasis 05/26/2015  . Alcoholic cirrhosis of liver with ascites ([place]) 05/26/2015  . Liver cirrhosis, alcoholic ([place]) 62/69/4854  . Hepatitis C 05/22/2015  . Hypotension 05/22/2015  . Anemia 05/22/2015  . Thrombocytopenia ([place]) 05/22/2015  . Coagulopathy ([place]) 05/22/2015  . Hyperglycemia 05/22/2015  . Polyneuropathy ([place]) 05/22/2015  . Urinary retention 05/22/2015  . Ascites 05/21/2015  . Hepatic cirrhosis ([place]) 05/19/2015  . Hypomagnesemia (low magnesium levels) 04/07/2015  . Chronic pain 04/03/2015  . At risk for osteopenia 04/03/2015  . Lumbar spondylosis 04/03/2015  . Chronic lumbar radicular pain (Left) 04/03/2015  . Chronic neck pain 04/03/2015  . Chronic lower extremity pain (Left) 04/03/2015  . Chronic sacroiliac joint pain 04/03/2015  . Chronic upper back pain 04/03/2015  . Long term current use of opiate analgesic 04/03/2015  . Encounter for chronic pain management 04/03/2015  . Bilateral low back pain with left-sided sciatica 01/01/2015  . Chronic pain syndrome 01/01/2015  . Cervical radicular pain 01/01/2015  . Other long term (current) drug therapy 01/01/2015  . Uncomplicated opioid dependence ([place]) 01/01/2015  . Long term prescription opiate use 01/01/2015  . Lumbar facet arthropathy 01/01/2015  . 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 Lumbar  facet syndrome (Bilateral) 01/01/2015  . Osteoarthritis of spine with radiculopathy, lumbar region 01/01/2015  . Pain of paraspinal muscle 01/01/2015  . Low back pain with radiation 01/01/2015  . Opiate use (120 MME/Day) 01/01/2015  . Encounter for therapeutic drug level monitoring 01/01/2015  . Insomnia, persistent 12/25/2014  . Major depression in remission ([place]) 12/25/2014  . BP (high blood pressure) 12/25/2014  . Encounter for general adult medical examination without abnormal  findings 12/25/2014  . HCV (hepatitis C virus) 12/25/2014  . ACUTE SINUSITIS, UNSPECIFIED 11/01/2009  . BRONCHITIS, ACUTE 11/01/2009    Past Surgical History:  Procedure Laterality Date  . BACK SURGERY  12/19/2011  . COLONOSCOPY WITH PROPOFOL N/A 09/25/2015   Procedure: COLONOSCOPY WITH PROPOFOL;  Surgeon: Lollie Sails, MD;  Location: [facility] ENDOSCOPY;  Service: Endoscopy;  Laterality: N/A;  . ESOPHAGOGASTRODUODENOSCOPY N/A 04/14/2015   Procedure: ESOPHAGOGASTRODUODENOSCOPY (EGD);  Surgeon: Lollie Sails, MD;  Location[facility] ENDOSCOPY;  Service: Endoscopy;  Laterality: N/A;  . ESOPHAGOGASTRODUODENOSCOPY (EGD) WITH PROPOFOL N/A 09/16/2015   Procedure: ESOPHAGOGASTRODUODENOSCOPY (EGD) WITH PROPOFOL;  Surgeon: Lollie Sails, MD;  Location: [facility] ENDOSCOPY;  Service: Endoscopy;  Laterality: N/A;  . TONSILLECTOMY    . TUBAL LIGATION      Past Surgical History:  Procedure Laterality Date  . BACK SURGERY  12/19/2011  . COLONOSCOPY WITH PROPOFOL N/A 09/25/2015   Procedure: COLONOSCOPY WITH PROPOFOL;  Surgeon: Lollie Sails, MD;  Location: [facility] ENDOSCOPY;  Service: Endoscopy;  Laterality: N/A;  . ESOPHAGOGASTRODUODENOSCOPY N/A 04/14/2015   Procedure: ESOPHAGOGASTRODUODENOSCOPY (EGD);  Surgeon: Lollie Sails, MD;  Location: [facility] ENDOSCOPY;  Service: Endoscopy;  Laterality: N/A;  . ESOPHAGOGASTRODUODENOSCOPY (EGD) WITH PROPOFOL N/A 09/16/2015   Procedure: ESOPHAGOGASTRODUODENOSCOPY (EGD) WITH PROPOFOL;  Surgeon: Lollie Sails, MD;  Location: [facility] ENDOSCOPY;  Service: Endoscopy;  Laterality: N/A;  . TONSILLECTOMY    . TUBAL LIGATION      Current Outpatient Rx  . Order #: [id number] Class: Historical Med  . Order #: [id number] Class: Historical Med  . Order #: [id number] Class: Historical Med  . Order #: [id number] Class: Print  . Order #: [id number] Class: Historical Med  . Order #: [id number] Class: Historical Med  . Order #: [id number] Class: Historical Med  . Order #: [id number] Class:  Historical Med  . Order #: [id number] Class: Historical Med  . Order #: [id number] Class: Historical Med  . Order #: [id number] Class: Print  . Order #: [id number] Class: Print  . Order #: [id number] Class: Print  . Order #: [id number] Class: Historical Med  . Order #: [id number] Class: Print  . Order #: [id number] Class: Print  . Order #: [id number] Class: Historical Med    Allergies:  Review of patient's allergies indicates no known allergies.  Family History: Family History  Problem Relation Age of Onset  . Stroke Mother   . Heart disease Mother   . [name] Father   . Breast cancer Sister 16  . Kidney disease Neg Hx   . Bladder Cancer Neg Hx     Social History: Social History  Substance Use Topics  . Smoking status: Current Every Day Smoker    Packs/day: 0.50    Years: 40.00    Types: Cigarettes  . Smokeless tobacco: Never Used  . Alcohol use No     Comment: stopped drinking 05/12/15     Review of Systems:   10 point review of systems was performed and was otherwise negative:  Constitutional: No fever Eyes: No visual disturbances ENT: No sore throat, ear pain Cardiac: No chest pain Respiratory: No shortness of breath, wheezing, or  stridor Abdomen: Focused abdominal pain at the hernia site with nausea but no persistent vomiting. Endocrine: No weight loss, No night sweats Extremities: No peripheral edema, cyanosis Skin: No rashes, easy bruising Neurologic: No focal weakness, trouble with speech or swollowing Urologic: No dysuria, Hematuria, or urinary frequency   Physical Exam:  ED Triage Vitals  Enc Vitals Group     BP 11/06/15 1433 (!) 144/87     Pulse Rate 11/06/15 1430 (!) 103     Resp 11/06/15 1433 (!) 22     Temp 11/06/15 1430 98.4 F (36.9 C)     Temp Source 11/06/15 1430 Oral     SpO2 11/06/15 1430 100 %     Weight 11/06/15 1432 148 lb (67.1 kg)     Height 11/06/15 1432 '5\' 9"'$  (1.753 m)     Head Circumference --      Peak Flow --      Pain Score 11/06/15  1432 10     Pain Loc --      Pain Edu? --      Excl. in [place]? --     General: Awake , Alert , and Oriented times 3; GCS 15 Head: Normal cephalic , atraumatic Eyes: Pupils equal , round, reactive to light Nose/Throat: No nasal drainage, patent upper airway without erythema or exudate.  Neck: Supple, Full range of motion, No anterior adenopathy or palpable thyroid masses Lungs: Clear to ascultation without wheezes , rhonchi, or rales Heart: Regular rate, regular rhythm without murmurs , gallops , or rubs Abdomen: Patient has what appears to be a very dusky appearing abdominal hernia which is very tender to palpation. Approximately 4 cm circular.       Extremities: 2 plus symmetric pulses. No edema, clubbing or cyanosis Neurologic: normal ambulation, Motor symmetric without deficits, sensory intact Skin: warm, dry, no rashes   Labs:   All laboratory work was reviewed including any pertinent negatives or positives listed below:  Labs Reviewed  COMPREHENSIVE METABOLIC PANEL - Abnormal; Notable for the following:       Result Value   Sodium 133 (*)    Chloride 97 (*)    Glucose, Bld 152 (*)    All other components within normal limits  CBC - Abnormal; Notable for the following:    RDW 18.1 (*)    Platelets 108 (*)    All other components within normal limits  LIPASE, BLOOD    Radiology:   CLINICAL DATA:  Abdominal pain.  Umbilical hernia.  EXAM: CT ABDOMEN AND PELVIS WITH CONTRAST  TECHNIQUE: Multidetector CT imaging of the abdomen and pelvis was performed using the standard protocol following bolus administration of intravenous contrast.  CONTRAST:  12m ISOVUE-300 IOPAMIDOL (ISOVUE-300) INJECTION 61%  COMPARISON:  CT scan dated 01/17/2015  FINDINGS: Lower chest:  Normal.  Hepatobiliary: The liver has an irregular contour with a prominent left lobe and a prominent caudate lobe consistent with cirrhosis. No masses. Numerous small stones in the gallbladder. No  biliary ductal dilatation.  Pancreas: No mass, inflammatory changes, or other significant abnormality.  Spleen: Within normal limits in size and appearance.  Adrenals/Urinary Tract: No masses identified. Normal kidneys and adrenal glands and bladder. No evidence of hydronephrosis.  Stomach/Bowel: There is an umbilical hernia containing mid small bowel. There is contrast in the small bowel up to the hernia but contrast does not pass through the slightly dilated bowel in the umbilical hernia. Proximal small bowel is dilated. Distal small bowel is normal. Terminal ileum and  appendix are normal. Scattered diverticula in the otherwise normal colon.  Vascular/Lymphatic: Aortic atherosclerosis.  No adenopathy.  Reproductive: No mass or other significant abnormality.  Other: Extensive ascites.  Musculoskeletal: No acute abnormality. The spine is fused from the upper thoracic spine through S1.  IMPRESSION: Partial small bowel obstruction secondary to probable incarcerated umbilical hernia.  Cirrhosis with extensive ascites.  Cholelithiasis, chronic.    I personally reviewed the radiologic studies   Procedures:  Patient was placed in a supine position with some Trendelenburg with constant pressure over the hernia site. The patient had reduction of the hernia with some still mild pain present but significant pain relief.   ED Course:  Patient had symptomatic improvement though still appears to have some indications of bowel stricture. The patient arrived phone consultations established with surgery here with a surgeon list team. She was given Dilaudid, Zofran, and Ativan for symptomatic improvement   Clinical Course     Assessment: * Strangulated abdominal hernia Bowel reduction Partial small bowel obstruction      Plan: Surgery consultation            Daymon Larsen, MD 11/06/15 [phone]

## 2015-11-06 NOTE — Anesthesia Preprocedure Evaluation (Signed)
Anesthesia Evaluation  Patient identified by MRN, date of birth, ID band Patient awake    Reviewed: Allergy & Precautions, NPO status , [name] Chart, lab work & pertinent test results  Airway Mallampati: II  TM Distance: <3 FB     Dental  (+) Upper Dentures, Lower Dentures   Pulmonary shortness of breath and with exertion, COPD, Current Smoker,     + decreased breath sounds      Cardiovascular Exercise Tolerance: Good hypertension, Pt. on medications  Rhythm:Regular     Neuro/Psych PSYCHIATRIC DISORDERS  Neuromuscular disease    GI/Hepatic negative GI ROS, (+) Hepatitis -, C  Endo/Other  diabetes, Type 2, Oral Hypoglycemic Agents  Renal/GU Renal disease     Musculoskeletal  (+) Fibromyalgia -  Abdominal ascites  Peds  Hematology  (+) anemia ,   Anesthesia Other Findings Past Medical History:   Depression                                                   Anxiety                                                      High risk medications (not anticoagulants) lon*              Vitamin D deficiency disease                                 Lumbago                                                      Hypertension                                                 Chronic hepatitis C (HCC)                                    Alcohol abuse                                                Bronchitis                                                   Arthropathy  Back pain                                                    Shoulder pain                                                Stress headaches                                             Hyperglycemia                                                SOB (shortness of breath)                                    Muscle spasm                                                 Edema                                                         Cirrhosis (HCC)                                              Polyarthritis                                                Fatigue                                                      Sinusitis                                                    RLS (restless legs syndrome)                                 Alcoholic cirrhosis of liver without ascites (*              Reflux  Fibromyalgia                                                 Spine disorder                                               Chronic LBP                                     12/25/2014      Comment:Overview:  Post extensive surgery    DDD (degenerative disc disease), lumbar         01/01/2015    Lumbar radicular pain                           01/01/2015    Neck pain                                       01/01/2015    Left leg pain                                   01/01/2015    Sacroiliac pain                                 01/01/2015    Upper back pain                                 01/01/2015    Urinary frequency                                            Anemia                                                       Diabetes mellitus without complication ([place])                 Reproductive/Obstetrics                             Anesthesia Physical  Anesthesia Plan  ASA: III  Anesthesia Plan: General   Post-op Pain Management:    Induction: Intravenous  Airway Management Planned: Natural Airway and Nasal Cannula  Additional Equipment:   Intra-op Plan:   Post-operative Plan:   Informed Consent: I have reviewed the patients History and Physical, chart, labs and discussed the procedure including the risks, benefits and alternatives for the proposed anesthesia with the patient or authorized representative who has indicated his/her understanding and acceptance.     Plan Discussed  with: CRNA  Anesthesia Plan Comments:          Anesthesia Quick Evaluation

## 2015-11-06 NOTE — ED Notes (Signed)
Dr. Burt Knack at pt bedside.

## 2015-11-06 NOTE — ED Notes (Signed)
MD at bedside. 

## 2015-11-06 NOTE — Transfer of Care (Signed)
Immediate Anesthesia Transfer of Care Note  Patient: Audrey Garrett  Procedure(s) Performed: Procedure(s): HERNIA REPAIR UMBILICAL ADULT (N/A)  Patient Location: PACU  Anesthesia Type:General  Level of Consciousness: awake, alert , oriented and patient cooperative  Airway & Oxygen Therapy: Patient Spontanous Breathing and Patient connected to nasal cannula oxygen  Post-op Assessment: Report given to RN and Post -op Vital signs reviewed and stable  Post vital signs: Reviewed and stable  Last Vitals:  Vitals:   11/06/15 1730 11/06/15 1800  BP: 113/72 118/75  Pulse: 87 90  Resp: 20 13  Temp:      Last Pain:  Vitals:   11/06/15 1722  TempSrc:   PainSc: 8          Complications: No apparent anesthesia complications

## 2015-11-06 NOTE — Op Note (Signed)
Abdominal Hernia Repair  Pre-operative Diagnosis: Incarcerated umbilical hernia  Post-operative Diagnosis: Incarcerated umbilical hernia  Surgeon: Jerrol Banana. Burt Knack, MD FACS  Anesthesia: Gen. with endotracheal tube  Assistant: Surgical tech  Procedure Details  The patient was seen again in the Holding Room. The benefits, complications, treatment options, and expected outcomes were discussed with the patient. The risks of bleeding, infection, recurrence of symptoms, failure to resolve symptoms, bowel injury, mesh placement, mesh infection, any of which could require further surgery were reviewed with the patient. Of most significance is the fact that the patient has cirrhosis and ascites and has had recent paracentesis for her tense ascites we discussed the risks of infected ascites ascites leak anastomosis and anastomotic leak in the face of ascites and liver failure multiple questions were answered for them we also discussed placement of drains to drain the ascites to decrease risk of ascites leak . The likelihood of improving the patient's symptoms with return to their baseline status is fair.  The patient and/or family concurred with the proposed plan, giving informed consent.  The patient was taken to Operating Room, identified as Audrey Garrett and the procedure verified.  A Time Out was held and the above information confirmed.  Prior to the induction of general anesthesia, antibiotic prophylaxis was administered. VTE prophylaxis was in place. General endotracheal anesthesia was then administered and tolerated well. After the induction, the abdomen was prepped with Chloraprep and draped in the sterile fashion. The patient was positioned in the supine position.  A curvilinear incision was made in the infraumbilical area and dissection was performed around the incarcerated umbilical hernia the hernia sac was opened and dusky dark red bowel was identified. Further dissection allowed for elevation of  the bowel out of the hernia sac and the fascia was incised in a caudad to cephalad direction approximately 2 cm this allowed for reduction of the hernia with the small bowel being placed back into the abdominal cavity. There was excessive bleeding from the hernia sac attempts were made to remove some of this hernia sac which resulted in more bleeding from engorged venous vessels. Cautery was utilized as were Vicryl sutures to control this hemorrhage. Ultimately was decided to remove only a small portion of the hernia sac the rest was left in situ due to the risk of ongoing bleeding.  The small bowel was elevated back into the abdominal wound and out side the abdominal cavity to inspect there was a small area of serosal tear which was oversewn with 3-0 silk Lembert type sutures. There was no sign of full-thickness injury and no sign of bleeding. Additionally and of importance is the fact that the bowel had regained its capillary turgor and was obviously viable. It was no sign of stricture and the bowel appeared to be well vascularized.  The small bowel was placed back into the abdominal cavity. 2 sites were chosen for drains in the right and left lower quadrant. An incision was made on each side and tunneling of a clamp was utilized to place a tunneled 10 mm JP drain through the abdominal wall which was then placed down in the pelvis on each side. He's were held in with 3-0 nylon's.  Attention was returned to the abdominal cavity there was minimal bleeding which was controlled with electrocautery at the fascial edges the fascia was then closed with multiple figure-of-eight 0 Ethibonds Marcaine was infiltrated into the skin and subcutaneous tissues tissues for a total of 30 cc. The sponge lap needle count  was correct at this point therefore the wound was closed in multiple layers after tacking the skin of the umbilicus back to the fascia with 3-0 Vicryl to Vicryl was utilized to close the subcutaneous taste  tissues in multiple layers and then skin staples were placed a sterile dressing was placed and sponge dressings were placed as well the drains were placed to bulb suction which rapidly filled with ascites  Also of note the opening of the abdominal cavity resulted in suctioning approximately 1350 cc of as ascites from the pelvis.  Patient thought of this procedure well the workup occasions she was taken the recovery room in stable condition with an abdominal binder in place to be admitted for continued care  Findings:  Ascites, viable bowel in a incarcerated umbilical hernia  Estimated Blood Loss: 25-50 cc         Drains: 2 JP drains tunneled through the abdominal wall and placed in the pelvis         Specimens: None       Complications: none               Audrey Garrett E. Burt Knack, MD, FACS

## 2015-11-07 ENCOUNTER — Encounter: Payer: Self-pay | Admitting: Surgery

## 2015-11-07 LAB — COMPREHENSIVE METABOLIC PANEL
ALT: 14 U/L (ref 14–54)
AST: 25 U/L (ref 15–41)
Albumin: 3 g/dL — ABNORMAL LOW (ref 3.5–5.0)
Alkaline Phosphatase: 64 U/L (ref 38–126)
Anion gap: 6 (ref 5–15)
BUN: 7 mg/dL (ref 6–20)
CO2: 28 mmol/L (ref 22–32)
Calcium: 8.6 mg/dL — ABNORMAL LOW (ref 8.9–10.3)
Chloride: 97 mmol/L — ABNORMAL LOW (ref 101–111)
Creatinine, Ser: 0.56 mg/dL (ref 0.44–1.00)
GFR calc Af Amer: >60 mL/min (ref >60)
GFR calc non Af Amer: >60 mL/min (ref >60)
Glucose, Bld: 145 mg/dL — ABNORMAL HIGH (ref 65–99)
Potassium: 4.2 mmol/L (ref 3.5–5.1)
Sodium: 131 mmol/L — ABNORMAL LOW (ref 135–145)
Total Bilirubin: 0.6 mg/dL (ref 0.3–1.2)
Total Protein: 6.6 g/dL (ref 6.5–8.1)

## 2015-11-07 LAB — CBC
HCT: 36.9 % (ref 35.0–47.0)
Hemoglobin: 12.5 g/dL (ref 12.0–16.0)
MCH: 30.5 pg (ref 26.0–34.0)
MCHC: 34 g/dL (ref 32.0–36.0)
MCV: 89.9 fL (ref 80.0–100.0)
Platelets: 112 10*3/uL — ABNORMAL LOW (ref 150–440)
RBC: 4.11 MIL/uL (ref 3.80–5.20)
RDW: 17.8 % — ABNORMAL HIGH (ref 11.5–14.5)
WBC: 8.1 10*3/uL (ref 3.6–11.0)

## 2015-11-07 LAB — PROTIME-INR
INR: 1.3
Prothrombin Time: 16.3 seconds — ABNORMAL HIGH (ref 11.4–15.2)

## 2015-11-07 LAB — GLUCOSE, CAPILLARY: Glucose-Capillary: 129 mg/dL — ABNORMAL HIGH (ref 65–99)

## 2015-11-07 MED ORDER — INSULIN ASPART 100 UNIT/ML ~~LOC~~ SOLN
0.0000 [IU] | Freq: Three times a day (TID) | SUBCUTANEOUS | Status: DC
  Administered 2015-11-08: 3 [IU] via SUBCUTANEOUS
  Administered 2015-11-08: 2 [IU] via SUBCUTANEOUS
  Administered 2015-11-09: 1 [IU] via SUBCUTANEOUS
  Administered 2015-11-09 (×2): 2 [IU] via SUBCUTANEOUS
  Administered 2015-11-10 (×2): 1 [IU] via SUBCUTANEOUS
  Administered 2015-11-11 – 2015-11-14 (×5): 2 [IU] via SUBCUTANEOUS
  Administered 2015-11-14 (×2): 1 [IU] via SUBCUTANEOUS
  Administered 2015-11-15: 2 [IU] via SUBCUTANEOUS
  Filled 2015-11-07: qty 3
  Filled 2015-11-07 (×2): qty 2
  Filled 2015-11-07 (×2): qty 1
  Filled 2015-11-07 (×3): qty 2
  Filled 2015-11-07: qty 1
  Filled 2015-11-07 (×4): qty 2

## 2015-11-07 MED ORDER — NALOXONE HCL 0.4 MG/ML IJ SOLN: 0.4000 mg | INTRAMUSCULAR | Status: DC | PRN

## 2015-11-07 MED ORDER — ONDANSETRON HCL 4 MG/2ML IJ SOLN: 4.0000 mg | Freq: Four times a day (QID) | INTRAMUSCULAR | Status: DC | PRN

## 2015-11-07 MED ORDER — DIPHENHYDRAMINE HCL 12.5 MG/5ML PO ELIX
12.5000 mg | ORAL_SOLUTION | Freq: Four times a day (QID) | ORAL | Status: DC | PRN
  Filled 2015-11-07: qty 5

## 2015-11-07 MED ORDER — SODIUM CHLORIDE 0.9% FLUSH: 9.0000 mL | INTRAVENOUS | Status: DC | PRN

## 2015-11-07 MED ORDER — DIPHENHYDRAMINE HCL 50 MG/ML IJ SOLN: 12.5000 mg | Freq: Four times a day (QID) | INTRAMUSCULAR | Status: DC | PRN

## 2015-11-07 MED ORDER — HYDROMORPHONE 1 MG/ML IV SOLN
INTRAVENOUS | Status: DC
  Administered 2015-11-07: 2.7 mg via INTRAVENOUS
  Administered 2015-11-07: 3 mg via INTRAVENOUS
  Administered 2015-11-07 – 2015-11-08 (×2): via INTRAVENOUS
  Administered 2015-11-08: 1.8 mg via INTRAVENOUS
  Administered 2015-11-09: 3.9 mg via INTRAVENOUS
  Administered 2015-11-09: 4.2 mg via INTRAVENOUS
  Administered 2015-11-09: 3.9 mg via INTRAVENOUS
  Administered 2015-11-09: 15:00:00 via INTRAVENOUS
  Administered 2015-11-10: 4.2 mg via INTRAVENOUS
  Administered 2015-11-10: 4.5 mg via INTRAVENOUS
  Administered 2015-11-10: 1.5 mg via INTRAVENOUS
  Administered 2015-11-10: 3.9 mg via INTRAVENOUS
  Administered 2015-11-10: 3 mg via INTRAVENOUS
  Administered 2015-11-10: 2.1 mg via INTRAVENOUS
  Administered 2015-11-11: 4.2 mg via INTRAVENOUS
  Administered 2015-11-11: 3 mg via INTRAVENOUS
  Administered 2015-11-11: 4.2 mg via INTRAVENOUS
  Administered 2015-11-12: 4.5 mg via INTRAVENOUS
  Administered 2015-11-12: 2.7 mg via INTRAVENOUS
  Administered 2015-11-12: 7.5 mg via INTRAVENOUS
  Administered 2015-11-12: 1.8 mg via INTRAVENOUS
  Filled 2015-11-07 (×5): qty 25

## 2015-11-07 NOTE — Progress Notes (Signed)
1 Day Post-Op  Subjective: Status post incarcerated umbilical hernia repair in a patient with alcoholic cirrhosis and ascites. Patient has considerable pain issues today no nausea or vomiting  Objective: Vital signs in last 24 hours: Temp:  [97.7 F (36.5 C)-98.4 F (36.9 C)] 98.1 F (36.7 C) (08/11 0554) Pulse Rate:  [82-110] 93 (08/11 0554) Resp:  [11-25] 18 (08/11 0554) BP: (102-144)/(62-90) 109/80 (08/11 0554) SpO2:  [95 %-100 %] 95 % (08/11 0554) Weight:  [146 lb 14.4 oz (66.6 kg)-148 lb (67.1 kg)] 146 lb 14.4 oz (66.6 kg) (08/10 2040) Last BM Date: 11/06/15  Intake/Output from previous day: 08/10 0701 - 08/11 0700 In: 800 [P.O.:100; I.V.:700] Out: 64 [Urine:950; Drains:1230; Blood:50] Intake/Output this shift: Total I/O In: -  Out: 110 [Drains:110]  Physical exam:  Very uncomfortable-appearing vital signs are stable afebrile abdomen is soft wound is dressed and dressing is not disturbed. Bilateral drains are out putting large amounts of clear ascites abs are nontender  Lab Results: CBC   Recent Labs  11/06/15 1434 11/07/15 0601  WBC 7.3 8.1  HGB 12.1 12.5  HCT 35.4 36.9  PLT 108* 112*   BMET  Recent Labs  11/06/15 1434 11/07/15 0601  NA 133* 131*  K 3.8 4.2  CL 97* 97*  CO2 26 28  GLUCOSE 152* 145*  BUN 6 7  CREATININE 0.57 0.56  CALCIUM 9.2 8.6*   PT/INR  Recent Labs  11/07/15 0601  LABPROT 16.3*  INR 1.30   ABG No results for input(s): PHART, HCO3 in the last 72 hours.  Invalid input(s): PCO2, PO2  Studies/Results: Ct Abdomen Pelvis W Contrast  Result Date: 11/06/2015 CLINICAL DATA:  Abdominal pain.  Umbilical hernia. EXAM: CT ABDOMEN AND PELVIS WITH CONTRAST TECHNIQUE: Multidetector CT imaging of the abdomen and pelvis was performed using the standard protocol following bolus administration of intravenous contrast. CONTRAST:  159m ISOVUE-300 IOPAMIDOL (ISOVUE-300) INJECTION 61% COMPARISON:  CT scan dated 01/17/2015 FINDINGS: Lower  chest:  Normal. Hepatobiliary: The liver has an irregular contour with a prominent left lobe and a prominent caudate lobe consistent with cirrhosis. No masses. Numerous small stones in the gallbladder. No biliary ductal dilatation. Pancreas: No mass, inflammatory changes, or other significant abnormality. Spleen: Within normal limits in size and appearance. Adrenals/Urinary Tract: No masses identified. Normal kidneys and adrenal glands and bladder. No evidence of hydronephrosis. Stomach/Bowel: There is an umbilical hernia containing mid small bowel. There is contrast in the small bowel up to the hernia but contrast does not pass through the slightly dilated bowel in the umbilical hernia. Proximal small bowel is dilated. Distal small bowel is normal. Terminal ileum and appendix are normal. Scattered diverticula in the otherwise normal colon. Vascular/Lymphatic: Aortic atherosclerosis.  No adenopathy. Reproductive: No mass or other significant abnormality. Other: Extensive ascites. Musculoskeletal: No acute abnormality. The spine is fused from the upper thoracic spine through S1. IMPRESSION: Partial small bowel obstruction secondary to probable incarcerated umbilical hernia. Cirrhosis with extensive ascites. Cholelithiasis, chronic. Aortic atherosclerosis. Electronically Signed   By: JLorriane ShireM.D.   On: 11/06/2015 15:55    Anti-infectives: Anti-infectives    Start     Dose/Rate Route Frequency Ordered Stop   11/06/15 2053  ceFAZolin (ANCEF) IVPB 2g/100 mL premix     2 g 200 mL/hr over 30 Minutes Intravenous 30 min pre-op 11/06/15 2053     11/06/15 1945  ciprofloxacin (CIPRO) tablet 500 mg    Comments:  Pt takes on Monday and Friday.  500 mg Oral Once per day on Mon Thu 11/06/15 1941        Assessment/Plan: s/p Procedure(s): HERNIA REPAIR UMBILICAL ADULT   Status post incarcerated umbilical hernia repair in a patient with alcoholic cirrhosis and ascites. Ascites drains are functional at  this point. We'll continue at strict bed rest and maintained dressings and abdominal binder and hopefully no ascites leak will occur through the abdominal wound. We will add PCA pump for patient's considerable pain control issues  [name], MD, FACS  11/07/2015

## 2015-11-07 NOTE — Consult Note (Addendum)
[place] [facility] [place] NAME: Audrey Garrett    MR#:  [id number]  DATE OF BIRTH:  [birth year]  DATE OF ADMISSION:  11/06/2015  PRIMARY CARE PHYSICIAN: Volanda Napoleon, MD   REQUESTING/REFERRING PHYSICIAN: Dr. Burt Knack  CHIEF COMPLAINT:   Chief Complaint  Patient presents with  . Umbilical Hernia    HISTORY OF PRESENT ILLNESS: Audrey Garrett  is a 55 y.o. female with a known history of Alcoholic liver cirrhosis and ascites, came with incarcerated umbilical hernia- status post surgery 11/06/15, medical consult is called for management of her liver cirrhosis and ascites. Patient had complain of severe abdominal pain but after starting on PCA drip she is feeling much better now. She is diagnosed with cirrhosis and ascites since February 2017, since then she stopped drinking alcohol and follows regularly with Dr. Anastasio Champion in GI clinic and takes all her medications on regular basis. She had required decided 4 times total since then, the last was one month ago. She feels getting much better than how she was 6 months ago, and also considering to have liver transplant now as referred by her GI doctor.  PAST MEDICAL HISTORY:   Past Medical History:  Diagnosis Date  . Alcohol abuse   . Alcoholic cirrhosis of liver without ascites ([place])   . Anemia   . Anxiety   . Arthropathy   . Back pain   . Bronchitis   . Chronic hepatitis C ([place])   . Chronic LBP 12/25/2014   Overview:  Post extensive surgery   . Cirrhosis ([place])   . DDD (degenerative disc disease), lumbar 01/01/2015  . Depression   . Diabetes mellitus without complication ([place])   . Edema   . Fatigue   . Fibromyalgia   . High risk medications (not anticoagulants) long-term use   . Hyperglycemia   . Hypertension   . Left leg pain 01/01/2015  . Lumbago   . Lumbar radicular pain 01/01/2015  . Muscle spasm   . Neck pain 01/01/2015  . Polyarthritis   . Reflux   . RLS (restless legs syndrome)   .  Sacroiliac pain 01/01/2015  . Shoulder pain   . Sinusitis   . SOB (shortness of breath)   . Spine disorder   . Stress headaches   . Upper back pain 01/01/2015  . Urinary frequency   . Vitamin D deficiency disease     PAST SURGICAL HISTORY: Past Surgical History:  Procedure Laterality Date  . BACK SURGERY  12/19/2011  . COLONOSCOPY WITH PROPOFOL N/A 09/25/2015   Procedure: COLONOSCOPY WITH PROPOFOL;  Surgeon: Lollie Sails, MD;  Location[facility] ENDOSCOPY;  Service: Endoscopy;  Laterality: N/A;  . ESOPHAGOGASTRODUODENOSCOPY N/A 04/14/2015   Procedure: ESOPHAGOGASTRODUODENOSCOPY (EGD);  Surgeon: Lollie Sails, MD;  Location: [facility] ENDOSCOPY;  Service: Endoscopy;  Laterality: N/A;  . ESOPHAGOGASTRODUODENOSCOPY (EGD) WITH PROPOFOL N/A 09/16/2015   Procedure: ESOPHAGOGASTRODUODENOSCOPY (EGD) WITH PROPOFOL;  Surgeon: Lollie Sails, MD;  Location: [facility] ENDOSCOPY;  Service: Endoscopy;  Laterality: N/A;  . TONSILLECTOMY    . TUBAL LIGATION    . UMBILICAL HERNIA REPAIR N/A 11/06/2015   Procedure: HERNIA REPAIR UMBILICAL ADULT;  Surgeon: [name], MD;  Location: [facility] ORS;  Service: General;  Laterality: N/A;    SOCIAL HISTORY:  Social History  Substance Use Topics  . Smoking status: Current Every Day Smoker    Packs/day: 0.50    Years: 40.00    Types: Cigarettes  . Smokeless tobacco: Never  Used  . Alcohol use No     Comment: stopped drinking 05/12/15    FAMILY HISTORY:  Family History  Problem Relation Age of Onset  . Stroke Mother   . Heart disease Mother   . [name] Father   . Breast cancer Sister 28  . Kidney disease Neg Hx   . Bladder Cancer Neg Hx     DRUG ALLERGIES: No Known Allergies  REVIEW OF SYSTEMS:   CONSTITUTIONAL: No fever, fatigue or weakness.  EYES: No blurred or double vision.  EARS, NOSE, AND THROAT: No tinnitus or ear pain.  RESPIRATORY: No cough, shortness of breath, wheezing or hemoptysis.  CARDIOVASCULAR: No chest pain, orthopnea, edema.   GASTROINTESTINAL: No nausea, vomiting, diarrhea ,Positive for abdominal pain.  GENITOURINARY: No dysuria, hematuria.  ENDOCRINE: No polyuria, nocturia,  HEMATOLOGY: No anemia, easy bruising or bleeding SKIN: No rash or lesion. MUSCULOSKELETAL: No joint pain or arthritis.   NEUROLOGIC: No tingling, numbness, weakness.  PSYCHIATRY: No anxiety or depression.   MEDICATIONS AT HOME:  Prior to Admission medications   Medication Sig Start Date End Date Taking? Authorizing Provider  ciprofloxacin (CIPRO) 500 MG tablet Take 500 mg by mouth 2 (two) times a week. Pt takes on Monday and Friday.   Yes Historical Provider, MD  citalopram (CELEXA) 20 MG tablet Take 20 mg by mouth daily.   Yes Historical Provider, MD  furosemide (LASIX) 20 MG tablet Take 1 tablet (20 mg total) by mouth 2 (two) times daily. 09/26/15  Yes Loletha Grayer, MD  gabapentin (NEURONTIN) 300 MG capsule Take 300 mg by mouth 3 (three) times daily.   Yes Historical Provider, MD  lactulose (CHRONULAC) 10 GM/15ML solution Take 10 g by mouth 3 (three) times daily.   Yes Historical Provider, MD  Magnesium Oxide 250 MG TABS Take 250 mg by mouth daily.   Yes Historical Provider, MD  metFORMIN (GLUCOPHAGE) 500 MG tablet Take 500 mg by mouth 2 (two) times daily with a meal.    Yes Historical Provider, MD  Multiple Vitamins-Minerals (MULTIVITAMIN WITH MINERALS) tablet Take 1 tablet by mouth daily.   Yes Historical Provider, MD  Oxycodone HCl 20 MG TABS Take 1 tablet (20 mg total) by mouth every 6 (six) hours as needed. 10/01/15  Yes [name], MD  pantoprazole (PROTONIX) 40 MG tablet Take 40 mg by mouth 2 (two) times daily before a meal.    Yes Historical Provider, MD  phytonadione (VITAMIN K) 5 MG tablet Take 1 tablet (5 mg total) by mouth 3 (three) times a week. Patient taking differently: Take 5 mg by mouth every Monday, Wednesday, and Friday.  09/26/15  Yes Loletha Grayer, MD  spironolactone (ALDACTONE) 50 MG tablet Take 1 tablet (50  mg total) by mouth daily. 09/26/15  Yes Richard Leslye Peer, MD  sucralfate (CARAFATE) 1 GM/10ML suspension Take 1 g by mouth 3 (three) times daily.    Yes Historical Provider, MD      PHYSICAL EXAMINATION:   VITAL SIGNS: Blood pressure 107/71, pulse 81, temperature 98.2 F (36.8 C), temperature source Oral, resp. rate 12, height '5\' 9"'$  (1.753 m), weight 66.6 kg (146 lb 14.4 oz), SpO2 97 %.  GENERAL:  55 y.o.-year-old patient lying in the bed with no acute distress.  EYES: Pupils equal, round, reactive to light and accommodation. No scleral icterus. Extraocular muscles intact.  HEENT: Head atraumatic, normocephalic. Oropharynx and nasopharynx clear.  NECK:  Supple, no jugular venous distention. No thyroid enlargement, no tenderness.  LUNGS: Normal breath sounds  bilaterally, no wheezing, rales,rhonchi or crepitation. No use of accessory muscles of respiration.  CARDIOVASCULAR: S1, S2 normal. No murmurs, rubs, or gallops.  ABDOMEN: Soft, tender, mild distended. Bowel sounds present. No organomegaly or mass. Abdominal binder after the surgery present. EXTREMITIES: No pedal edema, cyanosis, or clubbing.  NEUROLOGIC: Cranial nerves II through XII are intact. Muscle strength 5/5 in all extremities. Sensation intact. Gait not checked. No tremors. PSYCHIATRIC: The patient is alert and oriented x 3.  SKIN: No obvious rash, lesion, or ulcer.   LABORATORY PANEL:   CBC  Recent Labs Lab 11/06/15 1434 11/07/15 0601  WBC 7.3 8.1  HGB 12.1 12.5  HCT 35.4 36.9  PLT 108* 112*  MCV 88.7 89.9  MCH 30.3 30.5  MCHC 34.2 34.0  RDW 18.1* 17.8*   ------------------------------------------------------------------------------------------------------------------  Chemistries   Recent Labs Lab 11/06/15 1434 11/07/15 0601  NA 133* 131*  K 3.8 4.2  CL 97* 97*  CO2 26 28  GLUCOSE 152* 145*  BUN 6 7  CREATININE 0.57 0.56  CALCIUM 9.2 8.6*  AST 31 25  ALT 18 14  ALKPHOS 86 64  BILITOT 0.9 0.6    ------------------------------------------------------------------------------------------------------------------ estimated creatinine clearance is 84 mL/min (by C-G formula based on SCr of 0.8 mg/dL). ------------------------------------------------------------------------------------------------------------------ No results for input(s): TSH, T4TOTAL, T3FREE, THYROIDAB in the last 72 hours.  Invalid input(s): FREET3   Coagulation profile  Recent Labs Lab 11/07/15 0601  INR 1.30   ------------------------------------------------------------------------------------------------------------------- No results for input(s): DDIMER in the last 72 hours. -------------------------------------------------------------------------------------------------------------------  Cardiac Enzymes No results for input(s): CKMB, TROPONINI, MYOGLOBIN in the last 168 hours.  Invalid input(s): CK ------------------------------------------------------------------------------------------------------------------ Invalid input(s): POCBNP  ---------------------------------------------------------------------------------------------------------------  Urinalysis    Component Value Date/Time   COLORURINE YELLOW (A) 09/24/2015 1808   APPEARANCEUR CLEAR (A) 09/24/2015 1808   APPEARANCEUR Hazy 01/06/2014 2238   LABSPEC 1.003 (L) 09/24/2015 1808   LABSPEC 1.015 01/06/2014 2238   PHURINE 6.0 09/24/2015 1808   GLUCOSEU NEGATIVE 09/24/2015 1808   GLUCOSEU Negative 01/06/2014 2238   HGBUR NEGATIVE 09/24/2015 1808   BILIRUBINUR NEGATIVE 09/24/2015 1808   BILIRUBINUR Negative 01/06/2014 2238   KETONESUR NEGATIVE 09/24/2015 1808   PROTEINUR NEGATIVE 09/24/2015 1808   NITRITE NEGATIVE 09/24/2015 1808   LEUKOCYTESUR NEGATIVE 09/24/2015 1808   LEUKOCYTESUR Negative 01/06/2014 2238     RADIOLOGY: Ct Abdomen Pelvis W Contrast  Result Date: 11/06/2015 CLINICAL DATA:  Abdominal pain.  Umbilical hernia.  EXAM: CT ABDOMEN AND PELVIS WITH CONTRAST TECHNIQUE: Multidetector CT imaging of the abdomen and pelvis was performed using the standard protocol following bolus administration of intravenous contrast. CONTRAST:  17m ISOVUE-300 IOPAMIDOL (ISOVUE-300) INJECTION 61% COMPARISON:  CT scan dated 01/17/2015 FINDINGS: Lower chest:  Normal. Hepatobiliary: The liver has an irregular contour with a prominent left lobe and a prominent caudate lobe consistent with cirrhosis. No masses. Numerous small stones in the gallbladder. No biliary ductal dilatation. Pancreas: No mass, inflammatory changes, or other significant abnormality. Spleen: Within normal limits in size and appearance. Adrenals/Urinary Tract: No masses identified. Normal kidneys and adrenal glands and bladder. No evidence of hydronephrosis. Stomach/Bowel: There is an umbilical hernia containing mid small bowel. There is contrast in the small bowel up to the hernia but contrast does not pass through the slightly dilated bowel in the umbilical hernia. Proximal small bowel is dilated. Distal small bowel is normal. Terminal ileum and appendix are normal. Scattered diverticula in the otherwise normal colon. Vascular/Lymphatic: Aortic atherosclerosis.  No adenopathy. Reproductive: No mass or other significant abnormality. Other:  Extensive ascites. Musculoskeletal: No acute abnormality. The spine is fused from the upper thoracic spine through S1. IMPRESSION: Partial small bowel obstruction secondary to probable incarcerated umbilical hernia. Cirrhosis with extensive ascites. Cholelithiasis, chronic. Aortic atherosclerosis. Electronically Signed   By: Lorriane Shire M.D.   On: 11/06/2015 15:55    EKG: Orders placed or performed during the hospital encounter of 06/01/15  . ED EKG  . ED EKG  . EKG 12-Lead  . EKG 12-Lead  . EKG    IMPRESSION AND PLAN:  * Alcohol liver cirrhosis and ascites   Follows regularly with GI clinic.   On lactulose and  Spironolactone.   Continue.   Stable   Currently no signs of infection, no signs of encephalopathy or tremors.   She has some ascites present currently, but doesn't appear to require a tap.   She is on prophylactic antibiotic to prevent SBP, continue that.  * Incarcerated Umbilical hernia.   S/p surgery   On prophylactic Abx   Pain control with PCA drip.   Manage per surgical team.  * diabetes   COnt metformin.   Add insulin sliding scale coverage.  * Tobacco abuse  Councelled to stop smoking - time spent 4 min.  All the records are reviewed and case discussed with ED provider. Management plans discussed with the patient, family and they are in agreement.  CODE STATUS:    Code Status Orders        Start     Ordered   11/06/15 2054  Do not attempt resuscitation (DNR)  Continuous    Question Answer Comment  In the event of cardiac or respiratory ARREST Do not call a "code blue"   In the event of cardiac or respiratory ARREST Do not perform Intubation, CPR, defibrillation or ACLS   In the event of cardiac or respiratory ARREST Use medication by any route, position, wound care, and other measures to relive pain and suffering. May use oxygen, suction and manual treatment of airway obstruction as needed for comfort.      11/06/15 2053    Code Status History    Date Active Date Inactive Code Status Order ID Comments User Context   11/06/2015  7:41 PM 11/06/2015  8:53 PM DNR [id number]  [name], MD Inpatient   09/22/2015  4:51 PM 09/26/2015  2:57 PM Full Code [id number]  Hillary Bow, MD ED   05/26/2015  8:18 PM 05/28/2015  5:21 PM Full Code [id number]  Demetrios Loll, MD Inpatient   05/21/2015  2:59 PM 05/22/2015  7:30 PM Full Code [id number]  Gladstone Lighter, MD Inpatient   11/29/2013 12:02 PM 11/30/2013  3:31 AM Full Code [id number]  Logan Bores, MD HOV       TOTAL TIME TAKING CARE OF THIS PATIENT: 45 minutes.  Thank you for the opportunity to see this patient, will continue  following along with you.  Vaughan Basta M.D on 11/07/2015   Between 7am to 6pm - Pager - [phone]  After 6pm go to [email or web address] - password [email or web address] [facility]  Office  [phone]  CC: Primary care physician; Volanda Napoleon, MD   Note: This dictation was prepared with Dragon dictation along with smaller phrase technology. Any transcriptional errors that result from this process are unintentional.

## 2015-11-08 LAB — GLUCOSE, CAPILLARY
Glucose-Capillary: 204 mg/dL — ABNORMAL HIGH (ref 65–99)
Glucose-Capillary: 174 mg/dL — ABNORMAL HIGH (ref 65–99)
Glucose-Capillary: 119 mg/dL — ABNORMAL HIGH (ref 65–99)
Glucose-Capillary: 166 mg/dL — ABNORMAL HIGH (ref 65–99)

## 2015-11-08 NOTE — Care Management Important Message (Signed)
Important Message  Patient Details  Name: Donisha Hoch MRN: [id number] Date of Birth: [birth year]   Medicare Important Message Given:  Yes    Caron Tardif A, RN 11/08/2015, 4:18 PM

## 2015-11-08 NOTE — Progress Notes (Signed)
[place] at [place] NAME: Audrey Garrett    MR#:  [id number]  DATE OF BIRTH:  [birth year]  SUBJECTIVE:  CHIEF COMPLAINT:   Chief Complaint  Patient presents with  . Umbilical Hernia    s/p hernia surgery- medical consult for cirrhosis and ascites.  Pain under control, not much ascites distention. Started on diet now.  REVIEW OF SYSTEMS:   CONSTITUTIONAL: No fever, fatigue or weakness.  EYES: No blurred or double vision.  EARS, NOSE, AND THROAT: No tinnitus or ear pain.  RESPIRATORY: No cough, shortness of breath, wheezing or hemoptysis.  CARDIOVASCULAR: No chest pain, orthopnea, edema.  GASTROINTESTINAL: No nausea, vomiting, diarrhea ,Positive for abdominal pain.  GENITOURINARY: No dysuria, hematuria.  ENDOCRINE: No polyuria, nocturia,  HEMATOLOGY: No anemia, easy bruising or bleeding SKIN: No rash or lesion. MUSCULOSKELETAL: No joint pain or arthritis.   NEUROLOGIC: No tingling, numbness, weakness.  PSYCHIATRY: No anxiety or depression.   ROS  DRUG ALLERGIES:  No Known Allergies  VITALS:  Blood pressure 100/64, pulse 81, temperature 98.4 F (36.9 C), temperature source Oral, resp. rate 11, height '5\' 9"'$  (1.753 m), weight 66.6 kg (146 lb 14.4 oz), SpO2 94 %.  PHYSICAL EXAMINATION:   GENERAL:  55 y.o.-year-old patient lying in the bed with no acute distress.  EYES: Pupils equal, round, reactive to light and accommodation. No scleral icterus. Extraocular muscles intact.  HEENT: Head atraumatic, normocephalic. Oropharynx and nasopharynx clear.  NECK:  Supple, no jugular venous distention. No thyroid enlargement, no tenderness.  LUNGS: Normal breath sounds bilaterally, no wheezing, rales,rhonchi or crepitation. No use of accessory muscles of respiration.  CARDIOVASCULAR: S1, S2 normal. No murmurs, rubs, or gallops.  ABDOMEN: Soft, tender, mild distended. Bowel sounds present. No organomegaly or mass. Abdominal binder after the surgery  present. EXTREMITIES: No pedal edema, cyanosis, or clubbing.  NEUROLOGIC: Cranial nerves II through XII are intact. Muscle strength 5/5 in all extremities. Sensation intact. Gait not checked. No tremors. PSYCHIATRIC: The patient is alert and oriented x 3.  SKIN: No obvious rash, lesion, or ulcer.   Physical Exam LABORATORY PANEL:   CBC  Recent Labs Lab 11/07/15 0601  WBC 8.1  HGB 12.5  HCT 36.9  PLT 112*   ------------------------------------------------------------------------------------------------------------------  Chemistries   Recent Labs Lab 11/07/15 0601  NA 131*  K 4.2  CL 97*  CO2 28  GLUCOSE 145*  BUN 7  CREATININE 0.56  CALCIUM 8.6*  AST 25  ALT 14  ALKPHOS 64  BILITOT 0.6   ------------------------------------------------------------------------------------------------------------------  Cardiac Enzymes No results for input(s): TROPONINI in the last 168 hours. ------------------------------------------------------------------------------------------------------------------  RADIOLOGY:  Ct Abdomen Pelvis W Contrast  Result Date: 11/06/2015 CLINICAL DATA:  Abdominal pain.  Umbilical hernia. EXAM: CT ABDOMEN AND PELVIS WITH CONTRAST TECHNIQUE: Multidetector CT imaging of the abdomen and pelvis was performed using the standard protocol following bolus administration of intravenous contrast. CONTRAST:  134m ISOVUE-300 IOPAMIDOL (ISOVUE-300) INJECTION 61% COMPARISON:  CT scan dated 01/17/2015 FINDINGS: Lower chest:  Normal. Hepatobiliary: The liver has an irregular contour with a prominent left lobe and a prominent caudate lobe consistent with cirrhosis. No masses. Numerous small stones in the gallbladder. No biliary ductal dilatation. Pancreas: No mass, inflammatory changes, or other significant abnormality. Spleen: Within normal limits in size and appearance. Adrenals/Urinary Tract: No masses identified. Normal kidneys and adrenal glands and bladder. No  evidence of hydronephrosis. Stomach/Bowel: There is an umbilical hernia containing mid small bowel. There is contrast in the small  bowel up to the hernia but contrast does not pass through the slightly dilated bowel in the umbilical hernia. Proximal small bowel is dilated. Distal small bowel is normal. Terminal ileum and appendix are normal. Scattered diverticula in the otherwise normal colon. Vascular/Lymphatic: Aortic atherosclerosis.  No adenopathy. Reproductive: No mass or other significant abnormality. Other: Extensive ascites. Musculoskeletal: No acute abnormality. The spine is fused from the upper thoracic spine through S1. IMPRESSION: Partial small bowel obstruction secondary to probable incarcerated umbilical hernia. Cirrhosis with extensive ascites. Cholelithiasis, chronic. Aortic atherosclerosis. Electronically Signed   By: Lorriane Shire M.D.   On: 11/06/2015 15:55    ASSESSMENT AND PLAN:   Active Problems:   Incarcerated umbilical hernia   * Alcohol liver cirrhosis and ascites   Follows regularly with GI clinic.   On lactulose and Spironolactone.   Continue.   Stable   Currently no signs of infection, no signs of encephalopathy or tremors.   She has some ascites present currently, but doesn't appear to require a tap.   She is on prophylactic antibiotic to prevent SBP, continue that.  from drain- ascites fluid is draining.  * Incarcerated Umbilical hernia.   S/p surgery   On prophylactic Abx   Pain control with PCA drip.   Manage per surgical team.   Started on diet now.  * diabetes   COnt metformin.   Add insulin sliding scale coverage.   All the records are reviewed and case discussed with Care Management/Social Workerr. Management plans discussed with the patient, family and they are in agreement.  CODE STATUS: Full.  TOTAL TIME TAKING CARE OF THIS PATIENT: 35 minutes.    POSSIBLE D/C IN 1-2 DAYS, DEPENDING ON CLINICAL CONDITION.   Vaughan Basta M.D  on 11/08/2015   Between 7am to 6pm - Pager - [phone]  After 6pm go to [email or web address] - password [email or web address] [facility]  Office  [phone]  CC: Primary care physician; Volanda Napoleon, MD  Note: This dictation was prepared with Dragon dictation along with smaller phrase technology. Any transcriptional errors that result from this process are unintentional.

## 2015-11-08 NOTE — Progress Notes (Signed)
Patient is passing gas, and tolerating regular diet. JP drains continue to empty - see I&O flowsheet, color consistent with serosanguinous fluid.  Dressing to abdomen changed, per order.  Patient pain controlled well with PCA. Patient up ambulating in hallway, tolerated well. Husband present. Continue to assess.

## 2015-11-08 NOTE — Progress Notes (Signed)
2 Days Post-Op  Subjective: Status post repair of incarcerated umbilical hernia in a patient with cirrhotic ascites. She feels better today her pain is better controlled with the PCA she has no nausea or vomiting and is tolerating a clear liquid diet  Objective: Vital signs in last 24 hours: Temp:  [97.3 F (36.3 C)-98.4 F (36.9 C)] 98.4 F (36.9 C) (08/12 0508) Pulse Rate:  [81-84] 81 (08/12 0508) Resp:  [12-20] 20 (08/12 0508) BP: (100-107)/(64-71) 100/64 (08/12 0508) SpO2:  [92 %-97 %] 94 % (08/12 0508) FiO2 (%):  [0 %-43 %] 0 % (08/12 0400) Last BM Date: 11/06/15  Intake/Output from previous day: 08/11 0701 - 08/12 0700 In: 240 [P.O.:240] Out: 1860 [Urine:800; Drains:1060] Intake/Output this shift: Total I/O In: -  Out: 80 [Drains:80]  Physical exam:  Vital signs are stable high output from the drains as expected Wound inspected and found to be clean without erythema or drainage.  Lab Results: CBC   Recent Labs  11/06/15 1434 11/07/15 0601  WBC 7.3 8.1  HGB 12.1 12.5  HCT 35.4 36.9  PLT 108* 112*   BMET  Recent Labs  11/06/15 1434 11/07/15 0601  NA 133* 131*  K 3.8 4.2  CL 97* 97*  CO2 26 28  GLUCOSE 152* 145*  BUN 6 7  CREATININE 0.57 0.56  CALCIUM 9.2 8.6*   PT/INR  Recent Labs  11/07/15 0601  LABPROT 16.3*  INR 1.30   ABG No results for input(s): PHART, HCO3 in the last 72 hours.  Invalid input(s): PCO2, PO2  Studies/Results: Ct Abdomen Pelvis W Contrast  Result Date: 11/06/2015 CLINICAL DATA:  Abdominal pain.  Umbilical hernia. EXAM: CT ABDOMEN AND PELVIS WITH CONTRAST TECHNIQUE: Multidetector CT imaging of the abdomen and pelvis was performed using the standard protocol following bolus administration of intravenous contrast. CONTRAST:  165m ISOVUE-300 IOPAMIDOL (ISOVUE-300) INJECTION 61% COMPARISON:  CT scan dated 01/17/2015 FINDINGS: Lower chest:  Normal. Hepatobiliary: The liver has an irregular contour with a prominent left lobe  and a prominent caudate lobe consistent with cirrhosis. No masses. Numerous small stones in the gallbladder. No biliary ductal dilatation. Pancreas: No mass, inflammatory changes, or other significant abnormality. Spleen: Within normal limits in size and appearance. Adrenals/Urinary Tract: No masses identified. Normal kidneys and adrenal glands and bladder. No evidence of hydronephrosis. Stomach/Bowel: There is an umbilical hernia containing mid small bowel. There is contrast in the small bowel up to the hernia but contrast does not pass through the slightly dilated bowel in the umbilical hernia. Proximal small bowel is dilated. Distal small bowel is normal. Terminal ileum and appendix are normal. Scattered diverticula in the otherwise normal colon. Vascular/Lymphatic: Aortic atherosclerosis.  No adenopathy. Reproductive: No mass or other significant abnormality. Other: Extensive ascites. Musculoskeletal: No acute abnormality. The spine is fused from the upper thoracic spine through S1. IMPRESSION: Partial small bowel obstruction secondary to probable incarcerated umbilical hernia. Cirrhosis with extensive ascites. Cholelithiasis, chronic. Aortic atherosclerosis. Electronically Signed   By: JLorriane ShireM.D.   On: 11/06/2015 15:55    Anti-infectives: Anti-infectives    Start     Dose/Rate Route Frequency Ordered Stop   11/06/15 2053  ceFAZolin (ANCEF) IVPB 2g/100 mL premix     2 g 200 mL/hr over 30 Minutes Intravenous 30 min pre-op 11/06/15 2053     11/06/15 1945  ciprofloxacin (CIPRO) tablet 500 mg    Comments:  Pt takes on Monday and Friday.     500 mg Oral Once per day  on Mon Thu 11/06/15 1941        Assessment/Plan: s/p Procedure(s): HERNIA REPAIR UMBILICAL ADULT   Status post incarcerated umbilical hernia repair patient was cirrhotic ascites. Currently there is no sign of leak from the abdominal wound and the ascites coming out the drains as expected. We will continue drainage process and  bed rest but will advance diet.  [name], MD, FACS  11/08/2015

## 2015-11-09 LAB — BASIC METABOLIC PANEL
Anion gap: 6 (ref 5–15)
BUN: 6 mg/dL (ref 6–20)
CO2: 28 mmol/L (ref 22–32)
Calcium: 8.3 mg/dL — ABNORMAL LOW (ref 8.9–10.3)
Chloride: 93 mmol/L — ABNORMAL LOW (ref 101–111)
Creatinine, Ser: 0.43 mg/dL — ABNORMAL LOW (ref 0.44–1.00)
GFR calc Af Amer: >60 mL/min (ref >60)
GFR calc non Af Amer: >60 mL/min (ref >60)
Glucose, Bld: 130 mg/dL — ABNORMAL HIGH (ref 65–99)
Potassium: 4.3 mmol/L (ref 3.5–5.1)
Sodium: 127 mmol/L — ABNORMAL LOW (ref 135–145)

## 2015-11-09 LAB — CBC WITH DIFFERENTIAL/PLATELET
Basophils Absolute: 0 10*3/uL (ref 0–0.1)
Basophils Relative: 0 %
Eosinophils Absolute: 0.3 10*3/uL (ref 0–0.7)
Eosinophils Relative: 4 %
HCT: 36.2 % (ref 35.0–47.0)
Hemoglobin: 12.1 g/dL (ref 12.0–16.0)
Lymphocytes Relative: 26 %
Lymphs Abs: 2 10*3/uL (ref 1.0–3.6)
MCH: 30 pg (ref 26.0–34.0)
MCHC: 33.4 g/dL (ref 32.0–36.0)
MCV: 90 fL (ref 80.0–100.0)
Monocytes Absolute: 1.1 10*3/uL — ABNORMAL HIGH (ref 0.2–0.9)
Monocytes Relative: 14 %
Neutro Abs: 4.2 10*3/uL (ref 1.4–6.5)
Neutrophils Relative %: 56 %
Platelets: 106 10*3/uL — ABNORMAL LOW (ref 150–440)
RBC: 4.02 MIL/uL (ref 3.80–5.20)
RDW: 17.8 % — ABNORMAL HIGH (ref 11.5–14.5)
WBC: 7.6 10*3/uL (ref 3.6–11.0)

## 2015-11-09 LAB — GLUCOSE, CAPILLARY
Glucose-Capillary: 151 mg/dL — ABNORMAL HIGH (ref 65–99)
Glucose-Capillary: 184 mg/dL — ABNORMAL HIGH (ref 65–99)
Glucose-Capillary: 126 mg/dL — ABNORMAL HIGH (ref 65–99)
Glucose-Capillary: 134 mg/dL — ABNORMAL HIGH (ref 65–99)

## 2015-11-09 MED ORDER — FUROSEMIDE 20 MG PO TABS
20.0000 mg | ORAL_TABLET | Freq: Every day | ORAL | Status: DC
  Administered 2015-11-10 – 2015-11-15 (×6): 20 mg via ORAL
  Filled 2015-11-09 (×8): qty 1

## 2015-11-09 MED ORDER — SODIUM CHLORIDE 0.9 % IV SOLN
INTRAVENOUS | Status: DC
  Administered 2015-11-09 – 2015-11-10 (×3): via INTRAVENOUS

## 2015-11-09 NOTE — Progress Notes (Signed)
[place] at [place] NAME: Audrey Garrett    MR#:  [id number]  DATE OF BIRTH:  [birth year]  SUBJECTIVE:  CHIEF COMPLAINT:   Chief Complaint  Patient presents with  . Umbilical Hernia    s/p hernia surgery- medical consult for cirrhosis and ascites.  Pain under control, not much ascites distention. Started on diet now.  REVIEW OF SYSTEMS:   CONSTITUTIONAL: No fever, fatigue or weakness.  EYES: No blurred or double vision.  EARS, NOSE, AND THROAT: No tinnitus or ear pain.  RESPIRATORY: No cough, shortness of breath, wheezing or hemoptysis.  CARDIOVASCULAR: No chest pain, orthopnea, edema.  GASTROINTESTINAL: No nausea, vomiting, diarrhea ,Positive for abdominal pain.  GENITOURINARY: No dysuria, hematuria.  ENDOCRINE: No polyuria, nocturia,  HEMATOLOGY: No anemia, easy bruising or bleeding SKIN: No rash or lesion. MUSCULOSKELETAL: No joint pain or arthritis.   NEUROLOGIC: No tingling, numbness, weakness.  PSYCHIATRY: No anxiety or depression.   ROS  DRUG ALLERGIES:  No Known Allergies  VITALS:  Blood pressure 106/66, pulse 74, temperature 98.2 F (36.8 C), temperature source Oral, resp. rate (!) 9, height '5\' 9"'$  (1.753 m), weight 66.6 kg (146 lb 14.4 oz), SpO2 92 %.  PHYSICAL EXAMINATION:   GENERAL:  55 y.o.-year-old patient lying in the bed with no acute distress.  EYES: Pupils equal, round, reactive to light and accommodation. No scleral icterus. Extraocular muscles intact.  HEENT: Head atraumatic, normocephalic. Oropharynx and nasopharynx clear.  NECK:  Supple, no jugular venous distention. No thyroid enlargement, no tenderness.  LUNGS: Normal breath sounds bilaterally, no wheezing, rales,rhonchi or crepitation. No use of accessory muscles of respiration.  CARDIOVASCULAR: S1, S2 normal. No murmurs, rubs, or gallops.  ABDOMEN: Soft, tender, mild distended. Bowel sounds present. No organomegaly or mass. Abdominal binder after the  surgery present. EXTREMITIES: No pedal edema, cyanosis, or clubbing.  NEUROLOGIC: Cranial nerves II through XII are intact. Muscle strength 5/5 in all extremities. Sensation intact. Gait not checked. No tremors. PSYCHIATRIC: The patient is alert and oriented x 3.  SKIN: No obvious rash, lesion, or ulcer.   Physical Exam LABORATORY PANEL:   CBC  Recent Labs Lab 11/09/15 0635  WBC 7.6  HGB 12.1  HCT 36.2  PLT 106*   ------------------------------------------------------------------------------------------------------------------  Chemistries   Recent Labs Lab 11/07/15 0601 11/09/15 0635  NA 131* 127*  K 4.2 4.3  CL 97* 93*  CO2 28 28  GLUCOSE 145* 130*  BUN 7 6  CREATININE 0.56 0.43*  CALCIUM 8.6* 8.3*  AST 25  --   ALT 14  --   ALKPHOS 64  --   BILITOT 0.6  --    ------------------------------------------------------------------------------------------------------------------  Cardiac Enzymes No results for input(s): TROPONINI in the last 168 hours. ------------------------------------------------------------------------------------------------------------------  RADIOLOGY:  No results found.  ASSESSMENT AND PLAN:   Active Problems:   Incarcerated umbilical hernia   * Alcohol liver cirrhosis and ascites   Follows regularly with GI clinic.   On lactulose and Spironolactone.   Continue.   Stable   Currently no signs of infection, no signs of encephalopathy or tremors.   She has some ascites present currently, but doesn't appear to require a tap.   She is on prophylactic antibiotic to prevent SBP, continue that.  from JP drain- ascites fluid is draining.  * Incarcerated Umbilical hernia.   S/p surgery   On prophylactic Abx   Pain control with PCA drip.   Manage per surgical team.   Started on diet  now.  * Hyponatremia   Will start on NS drip.   Decreased Lasix.  * diabetes   COnt metformin.   Add insulin sliding scale coverage.  All the  records are reviewed and case discussed with Care Management/Social Workerr. Management plans discussed with the patient, family and they are in agreement.  CODE STATUS: Full.  TOTAL TIME TAKING CARE OF THIS PATIENT: 35 minutes.   POSSIBLE D/C IN 1-2 DAYS, DEPENDING ON CLINICAL CONDITION.   Vaughan Basta M.D on 11/09/2015   Between 7am to 6pm - Pager - [phone]  After 6pm go to [email or web address] - password [email or web address] [facility]  Office  [phone]  CC: Primary care physician; Volanda Napoleon, MD  Note: This dictation was prepared with Dragon dictation along with smaller phrase technology. Any transcriptional errors that result from this process are unintentional.

## 2015-11-09 NOTE — Progress Notes (Signed)
3 Days Post-Op  Subjective: Status post incarcerated umbilical hernia repair primarily in the face of a alcoholic cirrhosis and ascites. Patient has ongoing abdominal pain but seems to be feeling better.  Objective: Vital signs in last 24 hours: Temp:  [97.8 F (36.6 C)-98.2 F (36.8 C)] 98.2 F (36.8 C) (08/13 0431) Pulse Rate:  [74-88] 74 (08/13 0431) Resp:  [11-17] 15 (08/13 0431) BP: (104-109)/(66-73) 106/66 (08/13 0431) SpO2:  [93 %-96 %] 95 % (08/13 0431) FiO2 (%):  [0 %] 0 % (08/12 1600) Last BM Date: 11/06/15  Intake/Output from previous day: 08/12 0701 - 08/13 0700 In: 1438 [P.O.:1080; I.V.:473] Out: 1885 [Urine:1150; Drains:735] Intake/Output this shift: Total I/O In: 120 [P.O.:120] Out: 490 [Urine:350; Drains:140]  Physical exam:  Abdomen is soft and nontender wound shows no sign of drainage slight minimal erythema is present but no induration. JP drains in the pelvis are putting out 750 cc of ascitic fluid no purulence nontender calves  Lab Results: CBC   Recent Labs  11/07/15 0601 11/09/15 0635  WBC 8.1 7.6  HGB 12.5 12.1  HCT 36.9 36.2  PLT 112* 106*   BMET  Recent Labs  11/07/15 0601 11/09/15 0635  NA 131* 127*  K 4.2 4.3  CL 97* 93*  CO2 28 28  GLUCOSE 145* 130*  BUN 7 6  CREATININE 0.56 0.43*  CALCIUM 8.6* 8.3*   PT/INR  Recent Labs  11/07/15 0601  LABPROT 16.3*  INR 1.30   ABG No results for input(s): PHART, HCO3 in the last 72 hours.  Invalid input(s): PCO2, PO2  Studies/Results: No results found.  Anti-infectives: Anti-infectives    Start     Dose/Rate Route Frequency Ordered Stop   11/06/15 2053  ceFAZolin (ANCEF) IVPB 2g/100 mL premix  Status:  Discontinued     2 g 200 mL/hr over 30 Minutes Intravenous 30 min pre-op 11/06/15 2053 11/09/15 0838   11/06/15 1945  ciprofloxacin (CIPRO) tablet 500 mg    Comments:  Pt takes on Monday and Friday.     500 mg Oral Once per day on Mon Thu 11/06/15 1941         Assessment/Plan: s/p Procedure(s): HERNIA REPAIR UMBILICAL ADULT   Status post repair of an incarcerated umbilical hernia in the face of cirrhotic ascites. Patient has a following sodium and I will ask prime doc to assist in managing that so that it does not get any lower. Her IV is TKO at this point and she is back on her diuretics.  Would remain at bed rest at this point as there is no drainage but I would prefer this to seal better before we get her up and move around much.  [name], MD, FACS  11/09/2015

## 2015-11-10 LAB — GLUCOSE, CAPILLARY
Glucose-Capillary: 127 mg/dL — ABNORMAL HIGH (ref 65–99)
Glucose-Capillary: 148 mg/dL — ABNORMAL HIGH (ref 65–99)
Glucose-Capillary: 105 mg/dL — ABNORMAL HIGH (ref 65–99)
Glucose-Capillary: 132 mg/dL — ABNORMAL HIGH (ref 65–99)

## 2015-11-10 LAB — BASIC METABOLIC PANEL
Anion gap: 7 (ref 5–15)
BUN: 6 mg/dL (ref 6–20)
CO2: 25 mmol/L (ref 22–32)
Calcium: 8.2 mg/dL — ABNORMAL LOW (ref 8.9–10.3)
Chloride: 96 mmol/L — ABNORMAL LOW (ref 101–111)
Creatinine, Ser: 0.41 mg/dL — ABNORMAL LOW (ref 0.44–1.00)
GFR calc Af Amer: >60 mL/min (ref >60)
GFR calc non Af Amer: >60 mL/min (ref >60)
Glucose, Bld: 142 mg/dL — ABNORMAL HIGH (ref 65–99)
Potassium: 3.9 mmol/L (ref 3.5–5.1)
Sodium: 128 mmol/L — ABNORMAL LOW (ref 135–145)

## 2015-11-10 MED ORDER — VITAMIN B-1 100 MG PO TABS
100.0000 mg | ORAL_TABLET | Freq: Every day | ORAL | Status: DC
  Administered 2015-11-10 – 2015-11-15 (×6): 100 mg via ORAL
  Filled 2015-11-10 (×6): qty 1

## 2015-11-10 MED ORDER — LACTULOSE 10 GM/15ML PO SOLN
20.0000 g | Freq: Once | ORAL | Status: AC
  Administered 2015-11-10: 20 g via ORAL
  Filled 2015-11-10: qty 30

## 2015-11-10 NOTE — Progress Notes (Signed)
[place] at [place] NAME: Audrey Garrett    MR#:  [id number]  DATE OF BIRTH:  10-Feb-1961  SUBJECTIVE:  CHIEF COMPLAINT:   Chief Complaint  Patient presents with  . Umbilical Hernia    s/p hernia surgery- medical consult for cirrhosis and ascites.  Pain under control, not much ascites distention. Started on diet now.   Have ascites fluid leakage from JP drain.  REVIEW OF SYSTEMS:   CONSTITUTIONAL: No fever, fatigue or weakness.  EYES: No blurred or double vision.  EARS, NOSE, AND THROAT: No tinnitus or ear pain.  RESPIRATORY: No cough, shortness of breath, wheezing or hemoptysis.  CARDIOVASCULAR: No chest pain, orthopnea, edema.  GASTROINTESTINAL: No nausea, vomiting, diarrhea ,Positive for abdominal pain.  GENITOURINARY: No dysuria, hematuria.  ENDOCRINE: No polyuria, nocturia,  HEMATOLOGY: No anemia, easy bruising or bleeding SKIN: No rash or lesion. MUSCULOSKELETAL: No joint pain or arthritis.   NEUROLOGIC: No tingling, numbness, weakness.  PSYCHIATRY: No anxiety or depression.   ROS  DRUG ALLERGIES:  No Known Allergies  VITALS:  Blood pressure 102/70, pulse 78, temperature 98.2 F (36.8 C), temperature source Oral, resp. rate 11, height '5\' 9"'$  (1.753 m), weight 66.6 kg (146 lb 14.4 oz), SpO2 96 %.  PHYSICAL EXAMINATION:   GENERAL:  55 y.o.-year-old patient lying in the bed with no acute distress.  EYES: Pupils equal, round, reactive to light and accommodation. No scleral icterus. Extraocular muscles intact.  HEENT: Head atraumatic, normocephalic. Oropharynx and nasopharynx clear.  NECK:  Supple, no jugular venous distention. No thyroid enlargement, no tenderness.  LUNGS: Normal breath sounds bilaterally, no wheezing, rales,rhonchi or crepitation. No use of accessory muscles of respiration.  CARDIOVASCULAR: S1, S2 normal. No murmurs, rubs, or gallops.  ABDOMEN: Soft, tender, mild distended. Bowel sounds present. No organomegaly  or mass. Abdominal binder after the surgery present. EXTREMITIES: No pedal edema, cyanosis, or clubbing.  NEUROLOGIC: Cranial nerves II through XII are intact. Muscle strength 5/5 in all extremities. Sensation intact. Gait not checked. No tremors. PSYCHIATRIC: The patient is alert and oriented x 3.  SKIN: No obvious rash, lesion, or ulcer.   Physical Exam LABORATORY PANEL:   CBC  Recent Labs Lab 11/09/15 0635  WBC 7.6  HGB 12.1  HCT 36.2  PLT 106*   ------------------------------------------------------------------------------------------------------------------  Chemistries   Recent Labs Lab 11/07/15 0601  11/10/15 0506  NA 131*  < > 128*  K 4.2  < > 3.9  CL 97*  < > 96*  CO2 28  < > 25  GLUCOSE 145*  < > 142*  BUN 7  < > 6  CREATININE 0.56  < > 0.41*  CALCIUM 8.6*  < > 8.2*  AST 25  --   --   ALT 14  --   --   ALKPHOS 64  --   --   BILITOT 0.6  --   --   < > = values in this interval not displayed. ------------------------------------------------------------------------------------------------------------------  Cardiac Enzymes No results for input(s): TROPONINI in the last 168 hours. ------------------------------------------------------------------------------------------------------------------  RADIOLOGY:  No results found.  ASSESSMENT AND PLAN:   Active Problems:   Incarcerated umbilical hernia   * Alcohol liver cirrhosis and ascites   Follows regularly with GI clinic.   On lactulose and Spironolactone.   Continue.  Stable   Currently no signs of infection, no signs of encephalopathy or tremors.   She has some ascites present currently, but doesn't appear to require a tap.  She is on prophylactic antibiotic to prevent SBP, continue that.  from JP drain- ascites fluid is draining.  * Incarcerated Umbilical hernia.   S/p surgery   On prophylactic Abx   Pain control with PCA drip.   Manage per surgical team.   Started on diet .  *  Hyponatremia   on NS drip.   Decreased Lasix.  * diabetes   COnt metformin.   Add insulin sliding scale coverage.  * Constipation   Lactulose at higher dose for one time.  All the records are reviewed and case discussed with Care Management/Social Workerr. Management plans discussed with the patient, family and they are in agreement.  CODE STATUS: Full.  TOTAL TIME TAKING CARE OF THIS PATIENT: 35 minutes.   POSSIBLE D/C IN 1-2 DAYS, DEPENDING ON CLINICAL CONDITION.   Vaughan Basta M.D on 11/10/2015   Between 7am to 6pm - Pager - [phone]  After 6pm go to [email or web address] - password [email or web address] [facility]  Office  [phone]  CC: Primary care physician; Volanda Napoleon, MD  Note: This dictation was prepared with Dragon dictation along with smaller phrase technology. Any transcriptional errors that result from this process are unintentional.

## 2015-11-10 NOTE — Anesthesia Postprocedure Evaluation (Signed)
Anesthesia Post Note  Patient: Audrey Garrett  Procedure(s) Performed: Procedure(s) (LRB): HERNIA REPAIR UMBILICAL ADULT (N/A)  Patient location during evaluation: PACU Anesthesia Type: General Level of consciousness: awake and alert Pain management: pain level controlled Vital Signs Assessment: post-procedure vital signs reviewed and stable Respiratory status: spontaneous breathing, nonlabored ventilation, respiratory function stable and patient connected to nasal cannula oxygen Cardiovascular status: blood pressure returned to baseline and stable Postop Assessment: no signs of nausea or vomiting Anesthetic complications: no    Last Vitals:  Vitals:   11/10/15 0943 11/10/15 1116  BP: 107/61   Pulse: 80   Resp:  (!) 9  Temp:      Last Pain:  Vitals:   11/10/15 1116  TempSrc:   PainSc: 6                  Martha Clan

## 2015-11-10 NOTE — Progress Notes (Signed)
Pt is refusing to use IS.

## 2015-11-10 NOTE — Progress Notes (Signed)
JP drains output increasing overnight; One dose of PRN pain meds given; tolerated Regular diet w/o n/v; up/down to BR, passing flatus. Barbaraann Faster, RN 6:46 AM 11/10/2015

## 2015-11-10 NOTE — Progress Notes (Signed)
Subjective:   She continues to complain of significant abdominal pain which she feels is worse over the last 24 hours. She has no real change her vital signs. She is still having significant output from her JP drains. She's not had a bowel movement as yet but is tolerating a diet and does have per to normal flatus at this time.  Vital signs in last 24 hours: Temp:  [98.2 F (36.8 C)-98.3 F (36.8 C)] 98.2 F (36.8 C) (08/14 1447) Pulse Rate:  [76-80] 78 (08/14 1447) Resp:  [9-17] 11 (08/14 1510) BP: (98-107)/(57-70) 102/70 (08/14 1447) SpO2:  [95 %-99 %] 96 % (08/14 1510) Last BM Date: 11/06/15  Intake/Output from previous day: 08/13 0701 - 08/14 0700 In: 2595.5 [P.O.:960; I.V.:1445.5] Out: 5150 [Urine:4100; Drains:1050]  Exam:  Her abdomen looks great. I removed dressing and her abdominal binder. She does have active bowel sounds. There is no real erythema around her incision any longer. She seems to be recovering from surgery nicely.  Lab Results:  CBC  Recent Labs  11/09/15 0635  WBC 7.6  HGB 12.1  HCT 36.2  PLT 106*   CMP     Component Value Date/Time   NA 128 (L) 11/10/2015 0506   NA 138 01/06/2014 2238   K 3.9 11/10/2015 0506   K 3.6 01/06/2014 2238   CL 96 (L) 11/10/2015 0506   CL 103 01/06/2014 2238   CO2 25 11/10/2015 0506   CO2 25 01/06/2014 2238   GLUCOSE 142 (H) 11/10/2015 0506   GLUCOSE 144 (H) 01/06/2014 2238   BUN 6 11/10/2015 0506   BUN 5 (L) 01/06/2014 2238   CREATININE 0.41 (L) 11/10/2015 0506   CREATININE 0.66 01/06/2014 2238   CALCIUM 8.2 (L) 11/10/2015 0506   CALCIUM 8.3 (L) 01/06/2014 2238   PROT 6.6 11/07/2015 0601   PROT 8.0 01/06/2014 2238   ALBUMIN 3.0 (L) 11/07/2015 0601   ALBUMIN 3.2 (L) 01/06/2014 2238   AST 25 11/07/2015 0601   AST 70 (H) 01/06/2014 2238   ALT 14 11/07/2015 0601   ALT 43 01/06/2014 2238   ALKPHOS 64 11/07/2015 0601   ALKPHOS 95 01/06/2014 2238   BILITOT 0.6 11/07/2015 0601   BILITOT 2.1 (H) 01/06/2014 2238    GFRNONAA >60 11/10/2015 0506   GFRNONAA >60 01/06/2014 2238   GFRNONAA >60 11/26/2013 0457   GFRAA >60 11/10/2015 0506   GFRAA >60 01/06/2014 2238   GFRAA >60 11/26/2013 0457   PT/INR No results for input(s): LABPROT, INR in the last 72 hours.  Studies/Results: No results found.  Assessment/Plan: She still has marked drainage from her JP drains had at least 2000 cc over the last 24 hours. We will leave her drains in place. We'll increase her activity. Overall I think her progress is better than expected.

## 2015-11-10 NOTE — Progress Notes (Signed)
Pt verbalized fears of not getting care at home. When I offered to talk to case workers, she adamantly refused for case workers to talk to her or for anyone to come to her home. Pt also verbalized being upset at her husband. When he walked in she stopped talking about it. She stated she would like this to stay between her and I. Social worker aware, told to confide in her later when her husband leaves. Ammie Dalton, RN

## 2015-11-11 LAB — GLUCOSE, CAPILLARY
Glucose-Capillary: 116 mg/dL — ABNORMAL HIGH (ref 65–99)
Glucose-Capillary: 169 mg/dL — ABNORMAL HIGH (ref 65–99)
Glucose-Capillary: 105 mg/dL — ABNORMAL HIGH (ref 65–99)
Glucose-Capillary: 133 mg/dL — ABNORMAL HIGH (ref 65–99)

## 2015-11-11 LAB — BASIC METABOLIC PANEL
Anion gap: 6 (ref 5–15)
BUN: 6 mg/dL (ref 6–20)
CO2: 25 mmol/L (ref 22–32)
Calcium: 8.1 mg/dL — ABNORMAL LOW (ref 8.9–10.3)
Chloride: 98 mmol/L — ABNORMAL LOW (ref 101–111)
Creatinine, Ser: 0.4 mg/dL — ABNORMAL LOW (ref 0.44–1.00)
GFR calc Af Amer: >60 mL/min (ref >60)
GFR calc non Af Amer: >60 mL/min (ref >60)
Glucose, Bld: 121 mg/dL — ABNORMAL HIGH (ref 65–99)
Potassium: 4.1 mmol/L (ref 3.5–5.1)
Sodium: 129 mmol/L — ABNORMAL LOW (ref 135–145)

## 2015-11-11 LAB — AMMONIA: Ammonia: 20 umol/L (ref 9–35)

## 2015-11-11 MED ORDER — OXYCODONE-ACETAMINOPHEN 5-325 MG PO TABS
1.0000 | ORAL_TABLET | Freq: Four times a day (QID) | ORAL | Status: DC | PRN
  Administered 2015-11-12 – 2015-11-13 (×3): 2 via ORAL
  Filled 2015-11-11 (×3): qty 2

## 2015-11-11 MED ORDER — SODIUM CHLORIDE 0.9 % IV SOLN
INTRAVENOUS | Status: DC
  Administered 2015-11-11: 14:00:00 via INTRAVENOUS

## 2015-11-11 NOTE — Progress Notes (Signed)
Subjective:   She is feeling better. She does not have as much pain today. She says she has more problems with "soreness" that she does with pain. She continues to put out significant amounts of golden ascitic fluid. Her sodium is back up to 129. She's tolerating a regular diet.  Vital signs in last 24 hours: Temp:  [97.5 F (36.4 C)-98.4 F (36.9 C)] 98.3 F (36.8 C) (08/15 0622) Pulse Rate:  [71-82] 72 (08/15 0633) Resp:  [9-17] 9 (08/15 0759) BP: (97-108)/(54-70) 105/56 (08/15 0633) SpO2:  [45 %-99 %] 45 % (08/15 0759) FiO2 (%):  [93 %] 93 % (08/15 0759) Last BM Date: 11/06/15  Intake/Output from previous day: 08/14 0701 - 08/15 0700 In: 2108.2 [P.O.:630; I.V.:1328.2] Out: 1594 [Urine:2800; Drains:1270]  Exam:  Her wounds look good. The drains are still draining significant amount of fluid. She has active bowel sounds.  Lab Results:  CBC  Recent Labs  11/09/15 0635  WBC 7.6  HGB 12.1  HCT 36.2  PLT 106*   CMP     Component Value Date/Time   NA 129 (L) 11/11/2015 0456   NA 138 01/06/2014 2238   K 4.1 11/11/2015 0456   K 3.6 01/06/2014 2238   CL 98 (L) 11/11/2015 0456   CL 103 01/06/2014 2238   CO2 25 11/11/2015 0456   CO2 25 01/06/2014 2238   GLUCOSE 121 (H) 11/11/2015 0456   GLUCOSE 144 (H) 01/06/2014 2238   BUN 6 11/11/2015 0456   BUN 5 (L) 01/06/2014 2238   CREATININE 0.40 (L) 11/11/2015 0456   CREATININE 0.66 01/06/2014 2238   CALCIUM 8.1 (L) 11/11/2015 0456   CALCIUM 8.3 (L) 01/06/2014 2238   PROT 6.6 11/07/2015 0601   PROT 8.0 01/06/2014 2238   ALBUMIN 3.0 (L) 11/07/2015 0601   ALBUMIN 3.2 (L) 01/06/2014 2238   AST 25 11/07/2015 0601   AST 70 (H) 01/06/2014 2238   ALT 14 11/07/2015 0601   ALT 43 01/06/2014 2238   ALKPHOS 64 11/07/2015 0601   ALKPHOS 95 01/06/2014 2238   BILITOT 0.6 11/07/2015 0601   BILITOT 2.1 (H) 01/06/2014 2238   GFRNONAA >60 11/11/2015 0456   GFRNONAA >60 01/06/2014 2238   GFRNONAA >60 11/26/2013 0457   GFRAA >60  11/11/2015 0456   GFRAA >60 01/06/2014 2238   GFRAA >60 11/26/2013 0457   PT/INR No results for input(s): LABPROT, INR in the last 72 hours.  Studies/Results: No results found.  Assessment/Plan: We will continue her drains at the present time. I anticipate she will probably be discharged with drains in place an effort to slow her potential for abdominal wall leak. We will allow her to be out of her abdominal binder during the day on occasion. Once we can move her to oral pain medication we will consider discharge.

## 2015-11-11 NOTE — Progress Notes (Signed)
[facility] [place] [facility] NAME: Audrey Garrett    MR#:  [id number]  DATE OF BIRTH:  1960/12/21  SUBJECTIVE:  CHIEF COMPLAINT:   Chief Complaint  Patient presents with  . Umbilical Hernia    s/p hernia surgery- medical consult for cirrhosis and ascites.  Pain under control, not much ascites distention. Started on diet now.   Have ascites fluid leakage from JP drain. Does not have BM for few days.  REVIEW OF SYSTEMS:   CONSTITUTIONAL: No fever, fatigue or weakness.  EYES: No blurred or double vision.  EARS, NOSE, AND THROAT: No tinnitus or ear pain.  RESPIRATORY: No cough, shortness of breath, wheezing or hemoptysis.  CARDIOVASCULAR: No chest pain, orthopnea, edema.  GASTROINTESTINAL: No nausea, vomiting, diarrhea ,Positive for abdominal pain.  GENITOURINARY: No dysuria, hematuria.  ENDOCRINE: No polyuria, nocturia,  HEMATOLOGY: No anemia, easy bruising or bleeding SKIN: No rash or lesion. MUSCULOSKELETAL: No joint pain or arthritis.   NEUROLOGIC: No tingling, numbness, weakness.  PSYCHIATRY: No anxiety or depression.   ROS  DRUG ALLERGIES:  No Known Allergies  VITALS:  Blood pressure 109/62, pulse 81, temperature 98 F (36.7 C), temperature source Oral, resp. rate 15, height '5\' 9"'$  (1.753 m), weight 66.6 kg (146 lb 14.4 oz), SpO2 99 %.  PHYSICAL EXAMINATION:   GENERAL:  55 y.o.-year-old patient lying in the bed with no acute distress.  EYES: Pupils equal, round, reactive to light and accommodation. No scleral icterus. Extraocular muscles intact.  HEENT: Head atraumatic, normocephalic. Oropharynx and nasopharynx clear.  NECK:  Supple, no jugular venous distention. No thyroid enlargement, no tenderness.  LUNGS: Normal breath sounds bilaterally, no wheezing, rales,rhonchi or crepitation. No use of accessory muscles of respiration.  CARDIOVASCULAR: S1, S2 normal. No murmurs, rubs, or gallops.  ABDOMEN: Soft, tender, mild distended. Bowel  sounds present. No organomegaly or mass. Abdominal binder after the surgery present. EXTREMITIES: No pedal edema, cyanosis, or clubbing.  NEUROLOGIC: Cranial nerves II through XII are intact. Muscle strength 5/5 in all extremities. Sensation intact. Gait not checked. No tremors. PSYCHIATRIC: The patient is alert and oriented x 3.  SKIN: No obvious rash, lesion, or ulcer.   Physical Exam LABORATORY PANEL:   CBC  Recent Labs Lab 11/09/15 0635  WBC 7.6  HGB 12.1  HCT 36.2  PLT 106*   ------------------------------------------------------------------------------------------------------------------  Chemistries   Recent Labs Lab 11/07/15 0601  11/11/15 0456  NA 131*  < > 129*  K 4.2  < > 4.1  CL 97*  < > 98*  CO2 28  < > 25  GLUCOSE 145*  < > 121*  BUN 7  < > 6  CREATININE 0.56  < > 0.40*  CALCIUM 8.6*  < > 8.1*  AST 25  --   --   ALT 14  --   --   ALKPHOS 64  --   --   BILITOT 0.6  --   --   < > = values in this interval not displayed. ------------------------------------------------------------------------------------------------------------------  Cardiac Enzymes No results for input(s): TROPONINI in the last 168 hours. ------------------------------------------------------------------------------------------------------------------  RADIOLOGY:  No results found.  ASSESSMENT AND PLAN:   Active Problems:   Incarcerated umbilical hernia   * Alcohol liver cirrhosis and ascites   Follows regularly with GI clinic.   On lactulose and Spironolactone.   Continue.  Stable   Currently no signs of infection, no signs of encephalopathy or tremors.   She is on prophylactic antibiotic to  prevent SBP, continue that.  from JP drain- ascites fluid is draining.   She follows with Dr. Donnella Sham- advised to follow in 2 weeks from dischare.   As pt is stable I will sign off.    She can be discharged on current meds.    Need to be on SBP prophylaxis- Cipro twice weekly.  *  Incarcerated Umbilical hernia.   S/p surgery   On prophylactic Abx   Pain control with PCA drip.   Manage per surgical team.   Started on diet .  * Hyponatremia   on NS drip.   Decreased Lasix.   Now Improving.  * diabetes   COnt metformin.   Add insulin sliding scale coverage.  * Constipation   Lactulose at higher dose for one time.  All the records are reviewed and case discussed with Care Management/Social Workerr. Management plans discussed with the patient, family and they are in agreement.  CODE STATUS: Full.  TOTAL TIME TAKING CARE OF THIS PATIENT: 35 minutes.   POSSIBLE D/C IN 1-2 DAYS, DEPENDING ON CLINICAL CONDITION. Will sign off as she is stable.  Vaughan Basta M.D on 11/11/2015   Between 7am to 6pm - Pager - [phone]  After 6pm go to [email or web address] - password [email or web address]  Office  [phone]  CC: Primary care physician; Volanda Napoleon, MD  Note: This dictation was prepared with Dragon dictation along with smaller phrase technology. Any transcriptional errors that result from this process are unintentional.

## 2015-11-11 NOTE — Progress Notes (Signed)
Pt educated and encouraged to use IS. Refuses to use IS.

## 2015-11-12 LAB — BASIC METABOLIC PANEL
Anion gap: 2 — ABNORMAL LOW (ref 5–15)
BUN: 7 mg/dL (ref 6–20)
CO2: 32 mmol/L (ref 22–32)
Calcium: 8.7 mg/dL — ABNORMAL LOW (ref 8.9–10.3)
Chloride: 98 mmol/L — ABNORMAL LOW (ref 101–111)
Creatinine, Ser: 0.56 mg/dL (ref 0.44–1.00)
GFR calc Af Amer: >60 mL/min (ref >60)
GFR calc non Af Amer: >60 mL/min (ref >60)
Glucose, Bld: 115 mg/dL — ABNORMAL HIGH (ref 65–99)
Potassium: 4.1 mmol/L (ref 3.5–5.1)
Sodium: 132 mmol/L — ABNORMAL LOW (ref 135–145)

## 2015-11-12 LAB — GLUCOSE, CAPILLARY
Glucose-Capillary: 118 mg/dL — ABNORMAL HIGH (ref 65–99)
Glucose-Capillary: 176 mg/dL — ABNORMAL HIGH (ref 65–99)
Glucose-Capillary: 108 mg/dL — ABNORMAL HIGH (ref 65–99)
Glucose-Capillary: 116 mg/dL — ABNORMAL HIGH (ref 65–99)

## 2015-11-12 NOTE — Progress Notes (Signed)
RN went into pt's room and pt was crying. Pt verbalized that, "Y'all are not listening to me and my pain is not being controlled by the pump." She was also concerned about all the beeping coming from the pump. Pt expressed that she wanted to get the pump taken away and start on oral pain meds. RN tried to explain to her that there are safety parameters that cause the pump to beep. Pt then put her hand up and harshly told RN to stop making excuses for everything. RN just told her, " I was just trying to explain how it works, but I will stop explaining and call the MD to see if we can stop it."  MD was paged and he put in orders to D/C the PCA and start  Oral pain meds. PRN percocet was given to pt.

## 2015-11-12 NOTE — Plan of Care (Signed)
Problem: Bowel/Gastric: Goal: Will not experience complications related to bowel motility Outcome: Not Progressing Pt has not had a BM

## 2015-11-12 NOTE — Progress Notes (Signed)
Subjective:   She is depressed this morning. She feels as though she is not making any progress. She does have less abdominal pain but she still complains of discomfort when her drains are activated. She is eating a regular diet. She is not having any bowel function as yet but is passing gas. She is putting out approximately thousand 1200 cc of golden colored fluid every 24 hours. She is decreasing her IV pain medication and increasing her oral medication.  Vital signs in last 24 hours: Temp:  [97.5 F (36.4 C)-98.2 F (36.8 C)] 98.2 F (36.8 C) (08/16 0453) Pulse Rate:  [73-81] 74 (08/16 0453) Resp:  [10-20] 13 (08/16 0809) BP: (94-110)/(58-65) 94/58 (08/16 0453) SpO2:  [92 %-99 %] 99 % (08/16 0809) FiO2 (%):  [98 %] 98 % (08/15 1200) Last BM Date: 11/06/15  Intake/Output from previous day: 08/15 0701 - 08/16 0700 In: 846 [P.O.:820; I.V.:26] Out: 3335 [Urine:2500; Drains:1160]  Exam:  Her wounds look good with no evidence of erythema. Her abdomen is less distended. She does not have any abdominal pain except incisional pain.  Lab Results:  CBC No results for input(s): WBC, HGB, HCT, PLT in the last 72 hours. CMP     Component Value Date/Time   NA 132 (L) 11/12/2015 0411   NA 138 01/06/2014 2238   K 4.1 11/12/2015 0411   K 3.6 01/06/2014 2238   CL 98 (L) 11/12/2015 0411   CL 103 01/06/2014 2238   CO2 32 11/12/2015 0411   CO2 25 01/06/2014 2238   GLUCOSE 115 (H) 11/12/2015 0411   GLUCOSE 144 (H) 01/06/2014 2238   BUN 7 11/12/2015 0411   BUN 5 (L) 01/06/2014 2238   CREATININE 0.56 11/12/2015 0411   CREATININE 0.66 01/06/2014 2238   CALCIUM 8.7 (L) 11/12/2015 0411   CALCIUM 8.3 (L) 01/06/2014 2238   PROT 6.6 11/07/2015 0601   PROT 8.0 01/06/2014 2238   ALBUMIN 3.0 (L) 11/07/2015 0601   ALBUMIN 3.2 (L) 01/06/2014 2238   AST 25 11/07/2015 0601   AST 70 (H) 01/06/2014 2238   ALT 14 11/07/2015 0601   ALT 43 01/06/2014 2238   ALKPHOS 64 11/07/2015 0601   ALKPHOS 95  01/06/2014 2238   BILITOT 0.6 11/07/2015 0601   BILITOT 2.1 (H) 01/06/2014 2238   GFRNONAA >60 11/12/2015 0411   GFRNONAA >60 01/06/2014 2238   GFRNONAA >60 11/26/2013 0457   GFRAA >60 11/12/2015 0411   GFRAA >60 01/06/2014 2238   GFRAA >60 11/26/2013 0457   PT/INR No results for input(s): LABPROT, INR in the last 72 hours.  Studies/Results: No results found.  Assessment/Plan: Her sodium is now greater than 130. She seems to making very good progress. If we could just get her off her IV pain medication we can discuss discharge plans. We will discharge her home with her JP drains in place. I discussed extended the surgery and are follow-up plans. She is in agreement with this program.

## 2015-11-12 NOTE — Plan of Care (Signed)
Problem: Activity: Goal: Risk for activity intolerance will decrease Outcome: Progressing Pt has walked in the halls today.

## 2015-11-12 NOTE — Plan of Care (Signed)
Problem: Pain Managment: Goal: General experience of comfort will improve Outcome: Progressing Pt is off of the PCA and taking Oral and IV pain meds.

## 2015-11-13 LAB — GLUCOSE, CAPILLARY
Glucose-Capillary: 176 mg/dL — ABNORMAL HIGH (ref 65–99)
Glucose-Capillary: 160 mg/dL — ABNORMAL HIGH (ref 65–99)
Glucose-Capillary: 119 mg/dL — ABNORMAL HIGH (ref 65–99)
Glucose-Capillary: 126 mg/dL — ABNORMAL HIGH (ref 65–99)

## 2015-11-13 MED ORDER — ALPRAZOLAM ER 1 MG PO TB24
2.0000 mg | ORAL_TABLET | Freq: Every day | ORAL | Status: DC
  Administered 2015-11-13 – 2015-11-15 (×3): 2 mg via ORAL
  Filled 2015-11-13 (×3): qty 2

## 2015-11-13 MED ORDER — OXYCODONE-ACETAMINOPHEN 7.5-325 MG PO TABS
2.0000 | ORAL_TABLET | Freq: Four times a day (QID) | ORAL | Status: DC | PRN
  Administered 2015-11-13 – 2015-11-15 (×7): 2 via ORAL
  Filled 2015-11-13 (×8): qty 2

## 2015-11-13 NOTE — Progress Notes (Signed)
Subjective:   She complains bitterly of pain this morning with marked lower abdominal pain. With questioning however she admits that the problem is primarily her pain medicine and not really any increase in pain in her abdomen. We made the decision to stop her Dilaudid yesterday and eliminate her PCA with her permission. Today she wants back on her IV pain medication. She suggests that the morphine given to her intravenously does not solve any of her symptoms. Angry and abusive with staff. She's angry this morning that she's not getting any pain medicine.  Her laboratory values have improved. Her drainage remains approximately 02-1499 cc a day. Her wound looks good without any evidence of infection.  Vital signs in last 24 hours: Temp:  [97.4 F (36.3 C)-98.1 F (36.7 C)] 97.4 F (36.3 C) (08/17 0507) Pulse Rate:  [69-84] 69 (08/17 0507) Resp:  [10-17] 16 (08/17 0507) BP: (107-122)/(62-70) 122/70 (08/17 0507) SpO2:  [98 %-100 %] 100 % (08/17 0507) Last BM Date: 11/06/15  Intake/Output from previous day: 08/16 0701 - 08/17 0700 In: 983.8 [P.O.:840; I.V.:143.8] Out: 3299 [Urine:2975; Drains:1420]  Exam:  There is no significant change in her abdominal wounds. She does not have any erythema around the drain sites. She does have active bowel sounds.   Lab Results:  CBC No results for input(s): WBC, HGB, HCT, PLT in the last 72 hours. CMP     Component Value Date/Time   NA 132 (L) 11/12/2015 0411   NA 138 01/06/2014 2238   K 4.1 11/12/2015 0411   K 3.6 01/06/2014 2238   CL 98 (L) 11/12/2015 0411   CL 103 01/06/2014 2238   CO2 32 11/12/2015 0411   CO2 25 01/06/2014 2238   GLUCOSE 115 (H) 11/12/2015 0411   GLUCOSE 144 (H) 01/06/2014 2238   BUN 7 11/12/2015 0411   BUN 5 (L) 01/06/2014 2238   CREATININE 0.56 11/12/2015 0411   CREATININE 0.66 01/06/2014 2238   CALCIUM 8.7 (L) 11/12/2015 0411   CALCIUM 8.3 (L) 01/06/2014 2238   PROT 6.6 11/07/2015 0601   PROT 8.0 01/06/2014 2238    ALBUMIN 3.0 (L) 11/07/2015 0601   ALBUMIN 3.2 (L) 01/06/2014 2238   AST 25 11/07/2015 0601   AST 70 (H) 01/06/2014 2238   ALT 14 11/07/2015 0601   ALT 43 01/06/2014 2238   ALKPHOS 64 11/07/2015 0601   ALKPHOS 95 01/06/2014 2238   BILITOT 0.6 11/07/2015 0601   BILITOT 2.1 (H) 01/06/2014 2238   GFRNONAA >60 11/12/2015 0411   GFRNONAA >60 01/06/2014 2238   GFRNONAA >60 11/26/2013 0457   GFRAA >60 11/12/2015 0411   GFRAA >60 01/06/2014 2238   GFRAA >60 11/26/2013 0457   PT/INR No results for input(s): LABPROT, INR in the last 72 hours.  Studies/Results: No results found.  Assessment/Plan: The patient probably appears to be pain control. I think she has pain medicine problem with her history of alcoholism. We discussed at length and she cannot remain on IV pain medicine. We will put her on some anti-inciting medications increase her by mouth medicines see how we can control her symptoms. If that is unsuccessful we may need a pain management consult to help [place] on her pain issues. We will not be able to get her leave the hospital until she is willing to do without the IV pain medication.

## 2015-11-14 LAB — GLUCOSE, CAPILLARY
Glucose-Capillary: 124 mg/dL — ABNORMAL HIGH (ref 65–99)
Glucose-Capillary: 122 mg/dL — ABNORMAL HIGH (ref 65–99)
Glucose-Capillary: 175 mg/dL — ABNORMAL HIGH (ref 65–99)
Glucose-Capillary: 87 mg/dL (ref 65–99)

## 2015-11-14 NOTE — Progress Notes (Signed)
The patient was asleep this morning and not easily arousable. I will check on her later this morning.

## 2015-11-14 NOTE — Progress Notes (Signed)
Subjective:   She appears to be much more comfortable this morning. She is not complaining of any significant abdominal pain. Her new pain regimen appears to have satisfied her to this point. She is eating well with no problems moving her bowels. She continues to have 12 1500 cc's of golden drainage daily.  Vital signs in last 24 hours: Temp:  [98.3 F (36.8 C)-98.5 F (36.9 C)] 98.3 F (36.8 C) (08/18 0443) Pulse Rate:  [71-81] 73 (08/18 0443) Resp:  [17-18] 18 (08/18 0443) BP: (93-99)/(58-65) 93/58 (08/18 0443) SpO2:  [98 %-100 %] 98 % (08/18 0443) Last BM Date: 11/06/15  Intake/Output from previous day: 08/17 0701 - 08/18 0700 In: 480 [P.O.:480] Out: 3200 [Urine:2200; Drains:1000]  Exam:  Her abdomen is soft. Her wound looks good. There is no abdominal distention. She has no evidence of infection.  Lab Results:  CBC No results for input(s): WBC, HGB, HCT, PLT in the last 72 hours. CMP     Component Value Date/Time   NA 132 (L) 11/12/2015 0411   NA 138 01/06/2014 2238   K 4.1 11/12/2015 0411   K 3.6 01/06/2014 2238   CL 98 (L) 11/12/2015 0411   CL 103 01/06/2014 2238   CO2 32 11/12/2015 0411   CO2 25 01/06/2014 2238   GLUCOSE 115 (H) 11/12/2015 0411   GLUCOSE 144 (H) 01/06/2014 2238   BUN 7 11/12/2015 0411   BUN 5 (L) 01/06/2014 2238   CREATININE 0.56 11/12/2015 0411   CREATININE 0.66 01/06/2014 2238   CALCIUM 8.7 (L) 11/12/2015 0411   CALCIUM 8.3 (L) 01/06/2014 2238   PROT 6.6 11/07/2015 0601   PROT 8.0 01/06/2014 2238   ALBUMIN 3.0 (L) 11/07/2015 0601   ALBUMIN 3.2 (L) 01/06/2014 2238   AST 25 11/07/2015 0601   AST 70 (H) 01/06/2014 2238   ALT 14 11/07/2015 0601   ALT 43 01/06/2014 2238   ALKPHOS 64 11/07/2015 0601   ALKPHOS 95 01/06/2014 2238   BILITOT 0.6 11/07/2015 0601   BILITOT 2.1 (H) 01/06/2014 2238   GFRNONAA >60 11/12/2015 0411   GFRNONAA >60 01/06/2014 2238   GFRNONAA >60 11/26/2013 0457   GFRAA >60 11/12/2015 0411   GFRAA >60 01/06/2014 2238    GFRAA >60 11/26/2013 0457   PT/INR No results for input(s): LABPROT, INR in the last 72 hours.  Studies/Results: No results found.  Assessment/Plan: She seems to be much better with regard to her pain control using the Xanax and her Percocet. We will continue that regimen and perhaps discharge her over the weekend. She is in agreement with this plan.

## 2015-11-14 NOTE — Care Management (Signed)
Spoke with patient regarding home health services. Patient states she is "worried about JP drains falling out and her insides falling out". She don't want to go home with JP drains however she helped her sister after breast surgery. She is worried about walking to her bathroom. She is worried about dressing herself. She states no one has helped her do that while in the hospital. She thinks she can manage at home without an aide or a nurse. She states she is walking without any equipment. Her PCP is Dr. Elijio Miles.

## 2015-11-14 NOTE — Care Management Important Message (Signed)
Important Message  Patient Details  Name: Audrey Garrett MRN: [id number] Date of Birth: 05-02-1960   Medicare Important Message Given:  Yes    Beverly Sessions, RN 11/14/2015, 3:09 PM

## 2015-11-15 LAB — BASIC METABOLIC PANEL
Anion gap: 5 (ref 5–15)
BUN: 7 mg/dL (ref 6–20)
CO2: 26 mmol/L (ref 22–32)
Calcium: 8.5 mg/dL — ABNORMAL LOW (ref 8.9–10.3)
Chloride: 103 mmol/L (ref 101–111)
Creatinine, Ser: 0.5 mg/dL (ref 0.44–1.00)
GFR calc Af Amer: >60 mL/min (ref >60)
GFR calc non Af Amer: >60 mL/min (ref >60)
Glucose, Bld: 115 mg/dL — ABNORMAL HIGH (ref 65–99)
Potassium: 4.2 mmol/L (ref 3.5–5.1)
Sodium: 134 mmol/L — ABNORMAL LOW (ref 135–145)

## 2015-11-15 LAB — GLUCOSE, CAPILLARY: Glucose-Capillary: 193 mg/dL — ABNORMAL HIGH (ref 65–99)

## 2015-11-15 MED ORDER — ALPRAZOLAM ER 2 MG PO TB24: 2.0000 mg | ORAL_TABLET | Freq: Every day | ORAL | 2 refills | Status: DC

## 2015-11-15 MED ORDER — OXYCODONE-ACETAMINOPHEN 7.5-325 MG PO TABS: 2.0000 | ORAL_TABLET | Freq: Four times a day (QID) | ORAL | 0 refills | Status: DC | PRN

## 2015-11-15 NOTE — Final Progress Note (Signed)
Subjective:   She is feeling much better today. She is actually dressed and sitting on the bed when I arrived. She's tolerating a regular diet. Her pain appears to be under good control with her current regimen. She's ready to be discharged.  Vital signs in last 24 hours: Temp:  [97.7 F (36.5 C)-98.6 F (37 C)] 98.4 F (36.9 C) (08/19 0453) Pulse Rate:  [73-84] 75 (08/19 0453) Resp:  [16-18] 18 (08/19 0453) BP: (100-117)/(54-71) 100/54 (08/19 0453) SpO2:  [98 %-100 %] 99 % (08/19 0453) Last BM Date: 11/14/15  Intake/Output from previous day: 08/18 0701 - 08/19 0700 In: 215  Out: 3490 [Urine:2300; Drains:1190]  Exam:  Her wounds look good. She continues to strain about 12 1500 cc of golden fluid drainage every day.  Lab Results:  CBC No results for input(s): WBC, HGB, HCT, PLT in the last 72 hours. CMP     Component Value Date/Time   NA 134 (L) 11/15/2015 0633   NA 138 01/06/2014 2238   K 4.2 11/15/2015 0633   K 3.6 01/06/2014 2238   CL 103 11/15/2015 0633   CL 103 01/06/2014 2238   CO2 26 11/15/2015 0633   CO2 25 01/06/2014 2238   GLUCOSE 115 (H) 11/15/2015 0633   GLUCOSE 144 (H) 01/06/2014 2238   BUN 7 11/15/2015 0633   BUN 5 (L) 01/06/2014 2238   CREATININE 0.50 11/15/2015 0633   CREATININE 0.66 01/06/2014 2238   CALCIUM 8.5 (L) 11/15/2015 0633   CALCIUM 8.3 (L) 01/06/2014 2238   PROT 6.6 11/07/2015 0601   PROT 8.0 01/06/2014 2238   ALBUMIN 3.0 (L) 11/07/2015 0601   ALBUMIN 3.2 (L) 01/06/2014 2238   AST 25 11/07/2015 0601   AST 70 (H) 01/06/2014 2238   ALT 14 11/07/2015 0601   ALT 43 01/06/2014 2238   ALKPHOS 64 11/07/2015 0601   ALKPHOS 95 01/06/2014 2238   BILITOT 0.6 11/07/2015 0601   BILITOT 2.1 (H) 01/06/2014 2238   GFRNONAA >60 11/15/2015 0633   GFRNONAA >60 01/06/2014 2238   GFRNONAA >60 11/26/2013 0457   GFRAA >60 11/15/2015 0633   GFRAA >60 01/06/2014 2238   GFRAA >60 11/26/2013 0457   PT/INR No results for input(s): LABPROT, INR in the last  72 hours.  Studies/Results: No results found.  Assessment/Plan: We discussed discharge and she is in agreement. We will plan to resume her medications and at her Xanax and Percocet when necessary. She is in agreement and we'll set her up for office follow-up next week.

## 2015-11-15 NOTE — Progress Notes (Signed)
Patient is discharge home on room air in a stable condition, patient is instructed on how to take care and empty JP drain, return  Demonstration done , summary and f/u care given verbalized understanding , left with son

## 2015-11-24 ENCOUNTER — Encounter: Payer: Medicare Other | Admitting: Surgery

## 2015-11-24 ENCOUNTER — Ambulatory Visit (INDEPENDENT_AMBULATORY_CARE_PROVIDER_SITE_OTHER): Payer: Medicare Other | Admitting: Surgery

## 2015-11-24 ENCOUNTER — Encounter: Payer: Self-pay | Admitting: Surgery

## 2015-11-24 VITALS — BP 98/57 | HR 90 | Temp 98.5°F | Ht 67.0 in | Wt 145.0 lb

## 2015-11-24 DIAGNOSIS — K436 Other and unspecified ventral hernia with obstruction, without gangrene: Secondary | ICD-10-CM

## 2015-11-24 MED ORDER — OXYCODONE-ACETAMINOPHEN 5-325 MG PO TABS: 1.0000 | ORAL_TABLET | ORAL | 0 refills | Status: DC | PRN

## 2015-11-24 NOTE — Progress Notes (Signed)
Outpatient postop visit  11/24/2015  Emmelina Mcloughlin is an 55 y.o. female.    Procedure: Repair of incarcerated ventral hernia in a cirrhotic patient  CC: Minimal pain  HPI: Patient describes minimal pain but requests a lowering of her dose of narcotics from 7 a half to 5-1/2 of oxycodone. She is eating better and feeling better overall she is quite tearful her drains are recorded and examined.  Medications reviewed.    Physical Exam:  BP (!) 98/57 (BP Location: Right Arm, Patient Position: Sitting, Cuff Size: Normal)   Pulse 90   Temp 98.5 F (36.9 C) (Oral)   Ht '5\' 7"'$  (1.702 m)   Wt 145 lb (65.8 kg)   BMI 22.71 kg/m     PE: The curvilinear infraumbilical wound is healing well with no erythema no drainage and nontender. Half of the staples are removed and Steri-Strips and benzoin are placed.  The right JP drain is putting out less than the left and it is removed and a dressing is placed. The left JP drain is left in place that is draining ascites fluid.  Calves are nontender compression stockings are been any utilized    Assessment/Plan:  Patient is doing quite well no sign of wound infection no sign of wound drainage or breakdown at the midline curvilinear incision. The right JP drain is removed and half the staples are removed and Steri-Strips and benzoin are placed.  I discussed with him the fact that this may drain from the right sided JP site but that I would not remove the other drain for 2 weeks and I would remove it personally in 2 weeks in the office. That would only be done if there was no sign of wound breakdown at the midline curvilinear incision.  Overall she is doing quite well considering her severe ascites and cirrhosis with no sign of wound breakdown or infection.  [name], MD, FACS

## 2015-11-24 NOTE — Patient Instructions (Signed)
Please stop taking the Alprazolam.  Start taking the pain medication as prescribed.  We will see you in two weeks: 12/09/2015 at 3:00 PM at our [place] office. [phone].  You are able to take showers at this time.

## 2015-11-25 ENCOUNTER — Encounter: Payer: Medicare Other | Admitting: Surgery

## 2015-12-09 ENCOUNTER — Ambulatory Visit (INDEPENDENT_AMBULATORY_CARE_PROVIDER_SITE_OTHER): Payer: Medicare Other | Admitting: Surgery

## 2015-12-09 ENCOUNTER — Encounter: Payer: Self-pay | Admitting: Surgery

## 2015-12-09 VITALS — BP 111/75 | HR 79 | Temp 98.9°F | Wt 142.0 lb

## 2015-12-09 DIAGNOSIS — K436 Other and unspecified ventral hernia with obstruction, without gangrene: Secondary | ICD-10-CM

## 2015-12-09 MED ORDER — OXYCODONE-ACETAMINOPHEN 5-325 MG PO TABS: 1.0000 | ORAL_TABLET | ORAL | 0 refills | Status: DC | PRN

## 2015-12-09 NOTE — Patient Instructions (Addendum)
No lifting more than 15 lbs.  You are not allowed to do any type of exercises at this time.  Please schedule a follow up appointment with Dr. Gustavo Lah in the next week or so. Ask Dr. Gustavo Lah if you have any restrictions with fluid intake.

## 2015-12-09 NOTE — Progress Notes (Signed)
Patient returns today following repair of an incarcerated ventral hernia in the face of ascites due to end-stage cirrhosis. She has some pain and has continued drainage from her JP drain that center pelvis and this is an expected result. He is reminded that she has had prior paracentesis for tense ascites in the past and the rationale for the drain to prevent drainage from the abdominal wound is discussed. Her medical management of ascites is reviewed she is taking Lasix and spiral lactone. She sees Dr. Penny Pia PA. She has seen [facility] for a liver transplant as well.  Physical exam demonstrates a tearful in female patient abdomen is soft nondistended nontympanitic no ascites wave is noted in her wound is healing well with no erythema or drainage Steri-Strips and the remaining half of the staples are removed. The JP drain has ascites fluid and it no blood no purulence. That drain is removed. A sterile dressing is placed.  I discussed with she and her significant other the fact that this left lower quadrant drain which was placed in via Z tunnel May drain and it may not but if it does drain there is not much can be done bite except medical management of her ascitic fluid. I will see her back in 2 weeks but have also asked her to follow-up with Dr. Anastasio Champion because she may require another paracentesis in the future if she becomes tense again.  She does not have a reasonable understanding or acceptance of the severity of her condition and the fact that she is end-stage liver disease. This in spite of my discussions multiple times and frankness with her about her condition.

## 2015-12-22 ENCOUNTER — Ambulatory Visit: Payer: Medicare Other | Attending: Pain Medicine | Admitting: Pain Medicine

## 2015-12-22 ENCOUNTER — Encounter: Payer: Self-pay | Admitting: Pain Medicine

## 2015-12-22 VITALS — BP 107/69 | HR 80 | Temp 98.5°F | Resp 16 | Ht 70.0 in | Wt 144.4 lb

## 2015-12-22 DIAGNOSIS — F1190 Opioid use, unspecified, uncomplicated: Secondary | ICD-10-CM | POA: Diagnosis not present

## 2015-12-22 DIAGNOSIS — G894 Chronic pain syndrome: Secondary | ICD-10-CM | POA: Diagnosis not present

## 2015-12-22 DIAGNOSIS — M4726 Other spondylosis with radiculopathy, lumbar region: Secondary | ICD-10-CM | POA: Insufficient documentation

## 2015-12-22 DIAGNOSIS — M545 Low back pain: Secondary | ICD-10-CM | POA: Insufficient documentation

## 2015-12-22 DIAGNOSIS — M5412 Radiculopathy, cervical region: Secondary | ICD-10-CM | POA: Diagnosis not present

## 2015-12-22 DIAGNOSIS — E559 Vitamin D deficiency, unspecified: Secondary | ICD-10-CM | POA: Diagnosis not present

## 2015-12-22 DIAGNOSIS — Z79891 Long term (current) use of opiate analgesic: Secondary | ICD-10-CM | POA: Diagnosis not present

## 2015-12-22 DIAGNOSIS — F1010 Alcohol abuse, uncomplicated: Secondary | ICD-10-CM | POA: Diagnosis not present

## 2015-12-22 DIAGNOSIS — B1920 Unspecified viral hepatitis C without hepatic coma: Secondary | ICD-10-CM | POA: Diagnosis not present

## 2015-12-22 DIAGNOSIS — I10 Essential (primary) hypertension: Secondary | ICD-10-CM | POA: Diagnosis not present

## 2015-12-22 DIAGNOSIS — Z79899 Other long term (current) drug therapy: Secondary | ICD-10-CM | POA: Diagnosis not present

## 2015-12-22 DIAGNOSIS — K7031 Alcoholic cirrhosis of liver with ascites: Secondary | ICD-10-CM | POA: Diagnosis not present

## 2015-12-22 DIAGNOSIS — Z7984 Long term (current) use of oral hypoglycemic drugs: Secondary | ICD-10-CM | POA: Insufficient documentation

## 2015-12-22 DIAGNOSIS — F329 Major depressive disorder, single episode, unspecified: Secondary | ICD-10-CM | POA: Insufficient documentation

## 2015-12-22 DIAGNOSIS — Z87442 Personal history of urinary calculi: Secondary | ICD-10-CM | POA: Diagnosis not present

## 2015-12-22 DIAGNOSIS — E1142 Type 2 diabetes mellitus with diabetic polyneuropathy: Secondary | ICD-10-CM | POA: Diagnosis not present

## 2015-12-22 DIAGNOSIS — G8929 Other chronic pain: Secondary | ICD-10-CM | POA: Diagnosis not present

## 2015-12-22 DIAGNOSIS — G4700 Insomnia, unspecified: Secondary | ICD-10-CM | POA: Insufficient documentation

## 2015-12-22 DIAGNOSIS — G2581 Restless legs syndrome: Secondary | ICD-10-CM | POA: Insufficient documentation

## 2015-12-22 MED ORDER — OXYCODONE HCL 20 MG PO TABS: 20.0000 mg | ORAL_TABLET | Freq: Four times a day (QID) | ORAL | 0 refills | Status: DC | PRN

## 2015-12-22 MED ORDER — MAGNESIUM OXIDE -MG SUPPLEMENT 500 MG PO CAPS: 1.0000 | ORAL_CAPSULE | Freq: Two times a day (BID) | ORAL | 0 refills | Status: DC

## 2015-12-22 NOTE — Progress Notes (Signed)
Patient here for medication refill.  Patient is s/p umbilical hernia repair.   Safety precautions to be maintained throughout the outpatient stay will include: orient to surroundings, keep bed in low position, maintain call bell within reach at all times, provide assistance with transfer out of bed and ambulation.   Oxycodone HCL 20 mg qty 33/120 last fill 10/05/2015

## 2015-12-22 NOTE — Progress Notes (Signed)
[name] Name: Audrey Garrett  MRN: [id number]  Referring Provider: Jodi Marble, MD  DOB: 1961-02-10  PCP: Volanda Napoleon, MD  DOS: 12/22/2015  Note by: Kathlen Brunswick. Dossie Arbour, MD  Service setting: Ambulatory outpatient  Specialty: Interventional Pain Management  Location: [facility] (AMB) Pain Management Facility    Patient type: Established   Primary Reason(s) for Visit: Encounter for prescription drug management (Level of risk: moderate) CC: Back Pain (lower)  HPI  Audrey Garrett is a 55 y.o. year old, female patient, who comes today for an initial evaluation. She has ACUTE SINUSITIS, UNSPECIFIED; BRONCHITIS, ACUTE; Insomnia, persistent; Major depression in remission ([place]); BP (high blood pressure); Bilateral low back pain with left-sided sciatica; Chronic pain syndrome; Cervical radicular pain; Other long term (current) drug therapy; Uncomplicated opioid dependence ([place]); Long term prescription opiate use; Lumbar facet arthropathy; Failed back surgical syndrome; Lumbar facet syndrome (Bilateral); Osteoarthritis of spine with radiculopathy, lumbar region; Pain of paraspinal muscle; Low back pain with radiation; Opiate use (120 MME/Day); Encounter for therapeutic drug level monitoring; Encounter for general adult medical examination without abnormal findings; HCV (hepatitis C virus); Chronic pain; At risk for osteopenia; Lumbar spondylosis; Chronic lumbar radicular pain (Left); Chronic neck pain; Chronic lower extremity pain (Left); Chronic sacroiliac joint pain; Chronic upper back pain; Long term current use of opiate analgesic; Encounter for chronic pain management; Hypomagnesemia (low magnesium levels); Ascites; Liver cirrhosis, alcoholic ([place]); Hepatitis C; Hypotension; Anemia; Thrombocytopenia ([place]); Coagulopathy ([place]); Hyperglycemia; Polyneuropathy ([place]); Urinary retention; Difficulty urinating; Nephrolithiasis; Alcoholic cirrhosis of liver with ascites ([place]); Depression, major, recurrent, moderate  ([place]); Alcohol abuse; Abdominal pain, chronic, epigastric; Hepatic cirrhosis (HCC); GI bleed; Neurogenic pain; S/P thoracolumbar fusion (T5-L5); Grade 1 Retrolisthesis (4 mm) of C5 over C6 and C6 over C7; Cervical foraminal stenosis (Left C5-6; Bilateral C6-7); Hernia of anterior abdominal wall; and Incarcerated umbilical hernia on her problem list.. Her primarily concern today is the Back Pain (lower)  Pain Assessment: Self-Reported Pain Score: 5 /10 Clinically the patient looks like a 2/10 Reported level is inconsistent with clinical observations. Information on the proper use of the pain score provided to the patient today. Pain Type: Chronic pain Pain Location: Back Pain Orientation: Lower Pain Descriptors / Indicators: Dull, Constant (pain in left leg is aching, jabbing, sometimes she feels like her left knee gives away.) Pain Frequency: Constant  The patient comes into the clinics today for pharmacological management of her chronic pain. I last saw this patient on 10/01/2015. The patient  reports that she does not use drugs. Her body mass index is 20.72 kg/m. Patient informed of our intent to start going down on her opioids to a safer level. The CDC indicates that levels above 50 MME/Day have higher risks of respiratory depression and death. We intend to bring the levels down to those recommended by the CDC. Starting on the patient's next visit we will start titrating this medication down using 5 and 10 mg pills.  Date of Last Visit: 10/01/15 Service Provided on Last Visit: Med Refill  Controlled Substance Pharmacotherapy Assessment & REMS (Risk Evaluation and Mitigation Strategy)  Analgesic: Oxycodone IR 20 mg every 6 (80 mg/day) MME/day: 120 mg/day. Pill Count: Oxycodone HCL 20 mg qty 33/120 last fill 10/05/2015 Pharmacokinetics: Onset of action (Liberation/Absorption): Within expected pharmacological parameters Time to Peak effect (Distribution): Timing and results are as within normal  expected parameters Duration of action (Metabolism/Excretion): Within normal limits for medication Pharmacodynamics: Analgesic Effect: More than 50% Activity Facilitation: Medication(s) allow patient to sit, stand, walk,  and do the basic ADLs Perceived Effectiveness: Described as relatively effective, allowing for increase in activities of daily living (ADL) Side-effects or Adverse reactions: None reported Monitoring: [place] PMP: Online review of the past 20-monthperiod conducted. Compliant with practice rules and regulations List of all UDS test(s) done:  Lab Results  Component Value Date   TOXASSSELUR FINAL 10/01/2015   TOXASSSELUR FINAL 07/02/2015   TOXASSSELUR FINAL 04/03/2015   Last UDS on record: ToxAssure Select 13  Date Value Ref Range Status  10/01/2015 FINAL  Final    Comment:    ==================================================================== TOXASSURE SELECT 13 (MW) ==================================================================== Test                             Result       Flag       Units Drug Present and Declared for Prescription Verification   Oxycodone                      17692        EXPECTED   ng/mg creat   Oxymorphone                    8604         EXPECTED   ng/mg creat   Noroxycodone                   210272       EXPECTED   ng/mg creat   Noroxymorphone                 5435         EXPECTED   ng/mg creat    Sources of oxycodone are scheduled prescription medications.    Oxymorphone, noroxycodone, and noroxymorphone are expected    metabolites of oxycodone. Oxymorphone is also available as a    scheduled prescription medication. ==================================================================== Test                      Result    Flag   Units      Ref Range   Creatinine              26               mg/dL      >=20 ==================================================================== Declared Medications:  The flagging and interpretation on this  report are based on the  following declared medications.  Unexpected results may arise from  inaccuracies in the declared medications.  **Note: The testing scope of this panel includes these medications:  Oxycodone  **Note: The testing scope of this panel does not include following  reported medications:  Ciprofloxacin (Cipro)  Citalopram (Celexa)  Furosemide (Lasix)  Gabapentin  Iron (Ferrous Sulfate)  Lactulose  Lidocaine  Magnesium Oxide  Metformin  Multivitamin  Pantoprazole  Pantoprazole (Protonix)  Prednisone (Deltasone)  Spironolactone (Aldactone)  Sucralfate (Carafate)  Vitamin K ==================================================================== For clinical consultation, please call ([phone] ====================================================================    UDS interpretation: Compliant          Medication Assessment Form: Reviewed. Patient indicates being compliant with therapy Treatment compliance: Compliant Risk Assessment: Aberrant Behavior: None observed today Substance Use Disorder (SUD) Risk Level: No change since last visit Risk of opioid abuse or dependence: 0.7-3.0% with doses ? 36 MME/day and 6.1-26% with doses ? 120 MME/day. Opioid Risk Tool (ORT) Score: 7   Moderate Risk for SUD (Score between 4-7)  Depression Scale Score: PHQ-2: 6   78.6% Probability of major depressive disorder (6) PHQ-9: 18   Moderately severe depression (15-19)  Pharmacologic Plan: No change in therapy, at this time  Laboratory Chemistry  Inflammation Markers Lab Results  Component Value Date   ESRSEDRATE 27 04/03/2015   CRP <0.5 04/03/2015   Renal Function Lab Results  Component Value Date   BUN 7 11/15/2015   CREATININE 0.50 11/15/2015   GFRAA >60 11/15/2015   GFRNONAA >60 11/15/2015   Hepatic Function Lab Results  Component Value Date   AST 25 11/07/2015   ALT 14 11/07/2015   ALBUMIN 3.0 (L) 11/07/2015   Electrolytes Lab Results  Component  Value Date   NA 134 (L) 11/15/2015   K 4.2 11/15/2015   CL 103 11/15/2015   CALCIUM 8.5 (L) 11/15/2015   MG 1.5 (L) 05/26/2015   Pain Modulating Vitamins Lab Results  Component Value Date   VITAMINB12 686 04/03/2015   Coagulation Parameters Lab Results  Component Value Date   INR 1.30 11/07/2015   LABPROT 16.3 (H) 11/07/2015   APTT 34 10/10/2015   PLT 106 (L) 11/09/2015   Cardiovascular Lab Results  Component Value Date   BNP 929 (H) 09/23/2013   HGB 12.1 11/09/2015   HCT 36.2 11/09/2015    Note: Lab results reviewed.  Recent Diagnostic Imaging  Ct Abdomen Pelvis W Contrast  Result Date: 11/06/2015 CLINICAL DATA:  Abdominal pain.  Umbilical hernia. EXAM: CT ABDOMEN AND PELVIS WITH CONTRAST TECHNIQUE: Multidetector CT imaging of the abdomen and pelvis was performed using the standard protocol following bolus administration of intravenous contrast. CONTRAST:  186m ISOVUE-300 IOPAMIDOL (ISOVUE-300) INJECTION 61% COMPARISON:  CT scan dated 01/17/2015 FINDINGS: Lower chest:  Normal. Hepatobiliary: The liver has an irregular contour with a prominent left lobe and a prominent caudate lobe consistent with cirrhosis. No masses. Numerous small stones in the gallbladder. No biliary ductal dilatation. Pancreas: No mass, inflammatory changes, or other significant abnormality. Spleen: Within normal limits in size and appearance. Adrenals/Urinary Tract: No masses identified. Normal kidneys and adrenal glands and bladder. No evidence of hydronephrosis. Stomach/Bowel: There is an umbilical hernia containing mid small bowel. There is contrast in the small bowel up to the hernia but contrast does not pass through the slightly dilated bowel in the umbilical hernia. Proximal small bowel is dilated. Distal small bowel is normal. Terminal ileum and appendix are normal. Scattered diverticula in the otherwise normal colon. Vascular/Lymphatic: Aortic atherosclerosis.  No adenopathy. Reproductive: No mass or  other significant abnormality. Other: Extensive ascites. Musculoskeletal: No acute abnormality. The spine is fused from the upper thoracic spine through S1. IMPRESSION: Partial small bowel obstruction secondary to probable incarcerated umbilical hernia. Cirrhosis with extensive ascites. Cholelithiasis, chronic. Aortic atherosclerosis. Electronically Signed   By: JLorriane ShireM.D.   On: 11/06/2015 15:55   Meds  The patient has a current medication list which includes the following prescription(s): calcium elemental as carbonate, ciprofloxacin, furosemide, gabapentin, lactulose, magnesium oxide, metformin, multivitamin with minerals, oxycodone hcl, pantoprazole, paroxetine, phytonadione, spironolactone, sucralfate, and magnesium oxide.  Current Outpatient Prescriptions on File Prior to Visit  Medication Sig  . ciprofloxacin (CIPRO) 500 MG tablet Take 500 mg by mouth 2 (two) times a week. Pt takes on Monday and Friday.  . furosemide (LASIX) 20 MG tablet Take 1 tablet (20 mg total) by mouth 2 (two) times daily.  .Marland Kitchenlactulose (CHRONULAC) 10 GM/15ML solution Take 10 g by mouth 3 (three) times daily.  . Magnesium Oxide 250  MG TABS Take 250 mg by mouth daily.  . metFORMIN (GLUCOPHAGE) 500 MG tablet Take 500 mg by mouth 2 (two) times daily with a meal.   . Multiple Vitamins-Minerals (MULTIVITAMIN WITH MINERALS) tablet Take 1 tablet by mouth daily.  . pantoprazole (PROTONIX) 40 MG tablet Take 40 mg by mouth 2 (two) times daily before a meal.   . phytonadione (VITAMIN K) 5 MG tablet Take 1 tablet (5 mg total) by mouth 3 (three) times a week. (Patient taking differently: Take 5 mg by mouth every Monday, Wednesday, and Friday. )  . spironolactone (ALDACTONE) 50 MG tablet Take 1 tablet (50 mg total) by mouth daily.  . sucralfate (CARAFATE) 1 GM/10ML suspension Take 1 g by mouth 3 (three) times daily.    No current facility-administered medications on file prior to visit.    ROS  Constitutional: Denies any  fever or chills Gastrointestinal: No reported hemesis, hematochezia, vomiting, or acute GI distress Musculoskeletal: Denies any acute onset joint swelling, redness, loss of ROM, or weakness Neurological: No reported episodes of acute onset apraxia, aphasia, dysarthria, agnosia, amnesia, paralysis, loss of coordination, or loss of consciousness  Allergies  Audrey Garrett has No Known Allergies.  [place]  Medical:  Audrey Garrett  has a past medical history of Alcohol abuse; Alcoholic cirrhosis of liver without ascites ([place]); Anemia; Anxiety; Arthropathy; Back pain; Bronchitis; Chronic hepatitis C ([place]); Chronic LBP (12/25/2014); Cirrhosis ([place]); DDD (degenerative disc disease), lumbar (01/01/2015); Depression; Diabetes mellitus without complication ([place]); Edema; Fatigue; Fibromyalgia; High risk medications (not anticoagulants) long-term use; Hyperglycemia; Hypertension; Left leg pain (01/01/2015); Lumbago; Lumbar radicular pain (01/01/2015); Muscle spasm; Neck pain (01/01/2015); Polyarthritis; Reflux; RLS (restless legs syndrome); Sacroiliac pain (01/01/2015); Shoulder pain; Sinusitis; SOB (shortness of breath); Spine disorder; Stress headaches; Upper back pain (01/01/2015); Urinary frequency; and Vitamin D deficiency disease. Family: family history includes [name] in her father; Breast cancer (age of onset: 28) in her sister; Heart disease in her mother; Stroke in her mother. Surgical:  has a past surgical history that includes Tubal ligation; Tonsillectomy; Back surgery (12/19/2011); Esophagogastroduodenoscopy (N/A, 04/14/2015); Esophagogastroduodenoscopy (egd) with propofol (N/A, 09/16/2015); Colonoscopy with propofol (N/A, 09/25/2015); Umbilical hernia repair (N/A, 11/06/2015); and Hernia repair (N/A, 10/2015). Tobacco:  reports that she has been smoking Cigarettes.  She has a 20.00 pack-year smoking history. She has never used smokeless tobacco. Alcohol:  reports that she does not drink alcohol. Drug:  reports that  she does not use drugs.  Constitutional Exam  General appearance: Well nourished, well developed, and well hydrated. In no acute distress Vitals:   12/22/15 1000  BP: 107/69  Pulse: 80  Resp: 16  Temp: 98.5 F (36.9 C)  TempSrc: Oral  SpO2: 99%  Weight: 144 lb 6.4 oz (65.5 kg)  Height: '5\' 10"'$  (1.778 m)  BMI Assessment: Estimated body mass index is 20.72 kg/m as calculated from the following:   Height as of this encounter: '5\' 10"'$  (1.778 m).   Weight as of this encounter: 144 lb 6.4 oz (65.5 kg).   BMI interpretation: (18.5-24.9 kg/m2) = Ideal body weight BMI Readings from Last 4 Encounters:  12/22/15 20.72 kg/m  12/09/15 22.24 kg/m  11/24/15 22.71 kg/m  11/06/15 21.69 kg/m   Wt Readings from Last 4 Encounters:  12/22/15 144 lb 6.4 oz (65.5 kg)  12/09/15 142 lb (64.4 kg)  11/24/15 145 lb (65.8 kg)  11/06/15 146 lb 14.4 oz (66.6 kg)  Psych/Mental status: Alert and oriented x 3 (person, place, & time) Eyes: PERLA Respiratory: No evidence  of acute respiratory distress  Cervical Spine Exam  Inspection: No masses, redness, or swelling Alignment: Symmetrical Functional ROM: Unrestricted ROM Stability: No instability detected Muscle strength & Tone: Functionally intact Sensory: Unimpaired Palpation: Non-contributory  Upper Extremity (UE) Exam    Side: Right upper extremity  Side: Left upper extremity  Inspection: No masses, redness, swelling, or asymmetry  Inspection: No masses, redness, swelling, or asymmetry  Functional ROM: Unrestricted ROM         Functional ROM: Unrestricted ROM          Muscle strength & Tone: Functionally intact  Muscle strength & Tone: Functionally intact  Sensory: Unimpaired  Sensory: Unimpaired  Palpation: Non-contributory  Palpation: Non-contributory   Thoracic Spine Exam  Inspection: No masses, redness, or swelling Alignment: Symmetrical Functional ROM: Unrestricted ROM Stability: No instability detected Sensory: Unimpaired Muscle  strength & Tone: Functionally intact Palpation: Non-contributory  Lumbar Spine Exam  Inspection: No masses, redness, or swelling Alignment: Symmetrical Functional ROM: Unrestricted ROM Stability: No instability detected Muscle strength & Tone: Functionally intact Sensory: Unimpaired Palpation: Non-contributory Provocative Tests: Lumbar Hyperextension and rotation test: evaluation deferred today       Patrick's Maneuver: evaluation deferred today              Gait & Posture Assessment  Ambulation: Unassisted Gait: Relatively normal for age and body habitus Posture: WNL   Lower Extremity Exam    Side: Right lower extremity  Side: Left lower extremity  Inspection: No masses, redness, swelling, or asymmetry  Inspection: No masses, redness, swelling, or asymmetry  Functional ROM: Unrestricted ROM          Functional ROM: Unrestricted ROM          Muscle strength & Tone: Functionally intact  Muscle strength & Tone: Functionally intact  Sensory: Unimpaired  Sensory: Unimpaired  Palpation: Non-contributory  Palpation: Non-contributory   Assessment  Primary Diagnosis & Pertinent Problem List: The primary encounter diagnosis was Chronic pain. Diagnoses of Alcohol abuse, Long term current use of opiate analgesic, Opiate use (120 MME/Day), Hypomagnesemia (low magnesium levels), and Alcoholic cirrhosis of liver with ascites (HCC) were also pertinent to this visit.  Visit Diagnosis: 1. Chronic pain   2. Alcohol abuse   3. Long term current use of opiate analgesic   4. Opiate use (120 MME/Day)   5. Hypomagnesemia (low magnesium levels)   6. Alcoholic cirrhosis of liver with ascites ([place])    Plan of Care  Pharmacotherapy (Medications Ordered): Meds ordered this encounter  Medications  . Magnesium Oxide 500 MG CAPS    Sig: Take 1 capsule (500 mg total) by mouth 2 (two) times daily at 8 am and 10 pm.    Dispense:  100 capsule    Refill:  0    Do not place this medication, or any other  prescription from our practice, on "Automatic Refill". Patient may have prescription filled one day early if pharmacy is closed on scheduled refill date.  . Oxycodone HCl 20 MG TABS    Sig: Take 1 tablet (20 mg total) by mouth every 6 (six) hours as needed.    Dispense:  120 tablet    Refill:  0    Do not add this medication to the electronic "Automatic Refill" notification system. Patient may have prescription filled one day early if pharmacy is closed on scheduled refill date. Do not fill until: 12/30/15 To last until: 01/29/16   New Prescriptions   MAGNESIUM OXIDE 500 MG CAPS    Take  1 capsule (500 mg total) by mouth 2 (two) times daily at 8 am and 10 pm.   Medications administered during this visit: Audrey Garrett had no medications administered during this visit. Lab-work, Procedure(s), & Referral(s) Ordered: Orders Placed This Encounter  Procedures  . ToxASSURE Select 13 (MW), Urine   Imaging & Referral(s) Ordered: None  Interventional Therapies: Scheduled:  None at this time.    Considering:  None at this time.    PRN Procedures:  None at this time.    Requested PM Follow-up: Return in 4 weeks (on 01/19/2016) for Med-Mgmt.  Future Appointments Date Time Provider [place]  12/29/2015 1:15 PM [name], MD [facility] None  01/28/2016 9:40 AM [name], MD [facility] None   Primary Care Physician: Volanda Napoleon, MD Location: [facility] Outpatient Pain Management Facility Note by: Kathlen Brunswick Dossie Arbour, M.D, DABA, DABAPM, DABPM, DABIPP, FIPP  Pain Score Disclaimer: We use the NRS-11 scale. This is a self-reported, subjective measurement of pain severity with only modest accuracy. It is used primarily to identify changes within a particular patient. It must be understood that outpatient pain scales are significantly less accurate that those used for research, where they can be applied under ideal controlled circumstances with minimal exposure to  variables. In reality, the score is likely to be a combination of pain intensity and pain affect, where pain affect describes the degree of emotional arousal or changes in action readiness caused by the sensory experience of pain. Factors such as social and work situation, setting, emotional state, anxiety levels, expectation, and prior pain experience may influence pain perception and show large inter-individual differences that may also be affected by time variables.  Patient instructions provided during this appointment: There are no Patient Instructions on file for this visit.

## 2015-12-23 ENCOUNTER — Other Ambulatory Visit: Payer: Self-pay

## 2015-12-28 LAB — TOXASSURE SELECT 13 (MW), URINE

## 2015-12-29 ENCOUNTER — Encounter: Payer: Self-pay | Admitting: Surgery

## 2015-12-29 ENCOUNTER — Ambulatory Visit (INDEPENDENT_AMBULATORY_CARE_PROVIDER_SITE_OTHER): Payer: Medicare Other | Admitting: Surgery

## 2015-12-29 VITALS — BP 120/67 | HR 80 | Temp 98.5°F | Ht 67.0 in | Wt 144.0 lb

## 2015-12-29 DIAGNOSIS — K436 Other and unspecified ventral hernia with obstruction, without gangrene: Secondary | ICD-10-CM

## 2015-12-29 NOTE — Patient Instructions (Signed)
Please call if you have questions or concerns.  

## 2015-12-29 NOTE — Progress Notes (Signed)
Patient has no complaints she thinks her abdomen may be getting somewhat more distended but not very much and she has no abdominal pain. She had no drainage from her drain site wounds.  Abdomen is soft there is a fluid wave but it is not tense or distended and the wounds are all clean and dry without drainage or erythema.  Patient doing very well following incarcerated umbilical hernia repair with drain placement for cirrhosis and ascites. She is doing well at this point can follow up on an as-needed basis. She has medical management of her ascites.

## 2016-01-20 ENCOUNTER — Encounter: Payer: Self-pay | Admitting: Emergency Medicine

## 2016-01-20 ENCOUNTER — Emergency Department
Admission: EM | Admit: 2016-01-20 | Discharge: 2016-01-20 | Disposition: A | Discharge status: Other Acute Inpatient Hospital | Payer: Medicare Other | Attending: Emergency Medicine | Admitting: Emergency Medicine

## 2016-01-20 ENCOUNTER — Inpatient Hospital Stay (HOSPITAL_COMMUNITY)
Admission: AD | Admit: 2016-01-20 | Discharge: 2016-01-22 | DRG: 378 | Discharge status: Against Medical Advice | Payer: Medicare Other | Source: Other Acute Inpatient Hospital | Attending: Internal Medicine | Admitting: Internal Medicine

## 2016-01-20 DIAGNOSIS — B182 Chronic viral hepatitis C: Secondary | ICD-10-CM | POA: Diagnosis present

## 2016-01-20 DIAGNOSIS — G8929 Other chronic pain: Secondary | ICD-10-CM | POA: Diagnosis present

## 2016-01-20 DIAGNOSIS — Z803 Family history of malignant neoplasm of breast: Secondary | ICD-10-CM

## 2016-01-20 DIAGNOSIS — K7030 Alcoholic cirrhosis of liver without ascites: Secondary | ICD-10-CM | POA: Diagnosis present

## 2016-01-20 DIAGNOSIS — G894 Chronic pain syndrome: Secondary | ICD-10-CM

## 2016-01-20 DIAGNOSIS — Z823 Family history of stroke: Secondary | ICD-10-CM | POA: Diagnosis not present

## 2016-01-20 DIAGNOSIS — Z86010 Personal history of colonic polyps: Secondary | ICD-10-CM | POA: Diagnosis not present

## 2016-01-20 DIAGNOSIS — K922 Gastrointestinal hemorrhage, unspecified: Secondary | ICD-10-CM

## 2016-01-20 DIAGNOSIS — D649 Anemia, unspecified: Secondary | ICD-10-CM

## 2016-01-20 DIAGNOSIS — I10 Essential (primary) hypertension: Secondary | ICD-10-CM | POA: Insufficient documentation

## 2016-01-20 DIAGNOSIS — G2581 Restless legs syndrome: Secondary | ICD-10-CM | POA: Diagnosis present

## 2016-01-20 DIAGNOSIS — Z79891 Long term (current) use of opiate analgesic: Secondary | ICD-10-CM | POA: Diagnosis not present

## 2016-01-20 DIAGNOSIS — K219 Gastro-esophageal reflux disease without esophagitis: Secondary | ICD-10-CM | POA: Diagnosis present

## 2016-01-20 DIAGNOSIS — D62 Acute posthemorrhagic anemia: Secondary | ICD-10-CM | POA: Diagnosis present

## 2016-01-20 DIAGNOSIS — E1165 Type 2 diabetes mellitus with hyperglycemia: Secondary | ICD-10-CM | POA: Diagnosis present

## 2016-01-20 DIAGNOSIS — Z7984 Long term (current) use of oral hypoglycemic drugs: Secondary | ICD-10-CM | POA: Diagnosis not present

## 2016-01-20 DIAGNOSIS — E119 Type 2 diabetes mellitus without complications: Secondary | ICD-10-CM | POA: Diagnosis not present

## 2016-01-20 DIAGNOSIS — E440 Moderate protein-calorie malnutrition: Secondary | ICD-10-CM | POA: Insufficient documentation

## 2016-01-20 DIAGNOSIS — Z8249 Family history of ischemic heart disease and other diseases of the circulatory system: Secondary | ICD-10-CM | POA: Diagnosis not present

## 2016-01-20 DIAGNOSIS — K921 Melena: Secondary | ICD-10-CM | POA: Diagnosis present

## 2016-01-20 DIAGNOSIS — E871 Hypo-osmolality and hyponatremia: Secondary | ICD-10-CM

## 2016-01-20 DIAGNOSIS — F17210 Nicotine dependence, cigarettes, uncomplicated: Secondary | ICD-10-CM | POA: Diagnosis present

## 2016-01-20 DIAGNOSIS — B1920 Unspecified viral hepatitis C without hepatic coma: Secondary | ICD-10-CM | POA: Diagnosis present

## 2016-01-20 DIAGNOSIS — Z79899 Other long term (current) drug therapy: Secondary | ICD-10-CM | POA: Diagnosis not present

## 2016-01-20 DIAGNOSIS — K7469 Other cirrhosis of liver: Secondary | ICD-10-CM | POA: Diagnosis not present

## 2016-01-20 LAB — CBC WITH DIFFERENTIAL/PLATELET
Basophils Absolute: 0 10*3/uL (ref 0–0.1)
Basophils Relative: 0 %
Eosinophils Absolute: 0 10*3/uL (ref 0–0.7)
Eosinophils Relative: 0 %
HCT: 18.2 % — ABNORMAL LOW (ref 35.0–47.0)
Hemoglobin: 5.9 g/dL — ABNORMAL LOW (ref 12.0–16.0)
Lymphocytes Relative: 24 %
Lymphs Abs: 3.4 10*3/uL (ref 1.0–3.6)
MCH: 30.9 pg (ref 26.0–34.0)
MCHC: 32.6 g/dL (ref 32.0–36.0)
MCV: 94.7 fL (ref 80.0–100.0)
Monocytes Absolute: 1.5 10*3/uL — ABNORMAL HIGH (ref 0.2–0.9)
Monocytes Relative: 10 %
Neutro Abs: 9.1 10*3/uL — ABNORMAL HIGH (ref 1.4–6.5)
Neutrophils Relative %: 66 %
Platelets: 162 10*3/uL (ref 150–440)
RBC: 1.92 MIL/uL — ABNORMAL LOW (ref 3.80–5.20)
RDW: 20.9 % — ABNORMAL HIGH (ref 11.5–14.5)
WBC: 14.1 10*3/uL — ABNORMAL HIGH (ref 3.6–11.0)

## 2016-01-20 LAB — COMPREHENSIVE METABOLIC PANEL
ALT: 14 U/L (ref 14–54)
AST: 33 U/L (ref 15–41)
Albumin: 3.5 g/dL (ref 3.5–5.0)
Alkaline Phosphatase: 51 U/L (ref 38–126)
Anion gap: 10 (ref 5–15)
BUN: 21 mg/dL — ABNORMAL HIGH (ref 6–20)
CO2: 20 mmol/L — ABNORMAL LOW (ref 22–32)
Calcium: 9.3 mg/dL (ref 8.9–10.3)
Chloride: 99 mmol/L — ABNORMAL LOW (ref 101–111)
Creatinine, Ser: 0.78 mg/dL (ref 0.44–1.00)
GFR calc Af Amer: >60 mL/min (ref >60)
GFR calc non Af Amer: >60 mL/min (ref >60)
Glucose, Bld: 246 mg/dL — ABNORMAL HIGH (ref 65–99)
Potassium: 4.7 mmol/L (ref 3.5–5.1)
Sodium: 129 mmol/L — ABNORMAL LOW (ref 135–145)
Total Bilirubin: 0.6 mg/dL (ref 0.3–1.2)
Total Protein: 7.2 g/dL (ref 6.5–8.1)

## 2016-01-20 LAB — URINALYSIS COMPLETE WITH MICROSCOPIC (ARMC ONLY)
Bacteria, UA: NONE SEEN
Bilirubin Urine: NEGATIVE
Glucose, UA: NEGATIVE mg/dL
Hgb urine dipstick: NEGATIVE
Ketones, ur: NEGATIVE mg/dL
Leukocytes, UA: NEGATIVE
Nitrite: NEGATIVE
Protein, ur: NEGATIVE mg/dL
Specific Gravity, Urine: 1.006 (ref 1.005–1.030)
pH: 6 (ref 5.0–8.0)

## 2016-01-20 LAB — PROTIME-INR
INR: 1.28
Prothrombin Time: 16.1 seconds — ABNORMAL HIGH (ref 11.4–15.2)

## 2016-01-20 LAB — ABO/RH: ABO/RH(D): O POS

## 2016-01-20 LAB — AMMONIA: Ammonia: 24 umol/L (ref 9–35)

## 2016-01-20 LAB — APTT: aPTT: 34 seconds (ref 24–36)

## 2016-01-20 MED ORDER — SODIUM CHLORIDE 0.9 % IV SOLN
80.0000 mg | Freq: Once | INTRAVENOUS | Status: AC
  Administered 2016-01-20: 80 mg via INTRAVENOUS
  Filled 2016-01-20: qty 80

## 2016-01-20 MED ORDER — CEFTRIAXONE SODIUM-DEXTROSE 1-3.74 GM-% IV SOLR
1.0000 g | Freq: Once | INTRAVENOUS | Status: AC
  Administered 2016-01-20: 1 g via INTRAVENOUS
  Filled 2016-01-20: qty 50

## 2016-01-20 MED ORDER — DEXTROSE 5 % IV SOLN: 1.0000 g | Freq: Once | INTRAVENOUS | Status: DC

## 2016-01-20 MED ORDER — SODIUM CHLORIDE 0.9 % IV SOLN
8.0000 mg/h | INTRAVENOUS | Status: DC
  Filled 2016-01-20: qty 80

## 2016-01-20 MED ORDER — SODIUM CHLORIDE 0.9 % IV SOLN
10.0000 mL/h | Freq: Once | INTRAVENOUS | Status: AC
  Administered 2016-01-20: 10 mL/h via INTRAVENOUS

## 2016-01-20 MED ORDER — SODIUM CHLORIDE 0.9 % IV BOLUS (SEPSIS)
500.0000 mL | Freq: Once | INTRAVENOUS | Status: AC
  Administered 2016-01-20: 500 mL via INTRAVENOUS

## 2016-01-20 MED ORDER — PANTOPRAZOLE SODIUM 40 MG IV SOLR: 40.0000 mg | Freq: Two times a day (BID) | INTRAVENOUS | Status: DC

## 2016-01-20 NOTE — ED Provider Notes (Signed)
[facility] Emergency Department Provider Note  ____________________________________________  Time seen: Approximately 5:46 PM  I have reviewed the triage vital signs and the nursing notes.   HISTORY  Chief Complaint Anemia   HPI Audrey Garrett is a 55 y.o. female with a history of hepatitis C and alcoholic cirrhosis of the liver without ascites or varices and diverticulosis who presents for evaluation of melena. Patient reports prior history of melena. She had an EGD and colonoscopy done on June 2017 that showed gastropathy, polyps, diverticulosis. No evidence of varices. Patient reports yesterday she had 2 small bowel movements that were black in color. She has not had a bowel movement in over 24 hours. She has been taking lactulose 3 times a day. She also endorses that she is been more confused since yesterday. Her husband noticed that she is also more jaundiced than normal. Patient is currently undergoing treatment for hepatitis C. She denies abdominal pain, chills, fever, dysuria, hematuria, chest pain, shortness of breath. She does endorse one episode of vomiting earlier today which was nonbloody and nonbilious.  Past Medical History:  Diagnosis Date  . Alcohol abuse   . Alcoholic cirrhosis of liver without ascites ([place])   . Anemia   . Anxiety   . Arthropathy   . Back pain   . Bronchitis   . Chronic hepatitis C ([place])   . Chronic LBP 12/25/2014   Overview:  Post extensive surgery   . Cirrhosis ([place])   . DDD (degenerative disc disease), lumbar 01/01/2015  . Depression   . Diabetes mellitus without complication ([place])   . Edema   . Fatigue   . Fibromyalgia   . High risk medications (not anticoagulants) long-term use   . Hyperglycemia   . Hypertension   . Left leg pain 01/01/2015  . Lumbago   . Lumbar radicular pain 01/01/2015  . Muscle spasm   . Neck pain 01/01/2015  . Polyarthritis   . Reflux   . RLS (restless legs syndrome)   . Sacroiliac pain  01/01/2015  . Shoulder pain   . Sinusitis   . SOB (shortness of breath)   . Spine disorder   . Stress headaches   . Upper back pain 01/01/2015  . Urinary frequency   . Vitamin D deficiency disease     Patient Active Problem List   Diagnosis Date Noted  . Hernia of anterior abdominal wall   . Incarcerated umbilical hernia   . Neurogenic pain 10/01/2015  . S/P thoracolumbar fusion (T5-L5) 10/01/2015  . Grade 1 Retrolisthesis (4 mm) of C5 over C6 and C6 over C7 10/01/2015  . Cervical foraminal stenosis (Left C5-6; Bilateral C6-7) 10/01/2015  . GI bleed 09/22/2015  . Depression, major, recurrent, moderate ([place]) 05/27/2015  . Abdominal pain, chronic, epigastric 05/27/2015  . Difficulty urinating 05/26/2015  . Nephrolithiasis 05/26/2015  . Alcoholic cirrhosis of liver with ascites ([place]) 05/26/2015  . Hypotension 05/22/2015  . Anemia 05/22/2015  . Thrombocytopenia ([place]) 05/22/2015  . Coagulopathy ([place]) 05/22/2015  . Hyperglycemia 05/22/2015  . Polyneuropathy ([place]) 05/22/2015  . Urinary retention 05/22/2015  . Hepatic cirrhosis ([place]) 05/19/2015  . Hypomagnesemia (low magnesium levels) 04/07/2015  . At risk for osteopenia 04/03/2015  . Lumbar spondylosis 04/03/2015  . Chronic lumbar radicular pain (Left) 04/03/2015  . Chronic neck pain 04/03/2015  . Chronic lower extremity pain (Left) 04/03/2015  . Chronic sacroiliac joint pain 04/03/2015  . Chronic upper back pain 04/03/2015  . Long term current use of opiate analgesic  04/03/2015  . Encounter for chronic pain management 04/03/2015  . Bilateral low back pain with left-sided sciatica 01/01/2015  . Cervical radicular pain 01/01/2015  . Uncomplicated opioid dependence ([place]) 01/01/2015  . Long term prescription opiate use 01/01/2015  . 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 Lumbar facet syndrome (Bilateral) 01/01/2015  . Osteoarthritis of spine with radiculopathy, lumbar region 01/01/2015  . Pain of paraspinal muscle  01/01/2015  . Low back pain with radiation 01/01/2015  . Insomnia, persistent 12/25/2014  . BP (high blood pressure) 12/25/2014  . Encounter for general adult medical examination without abnormal findings 12/25/2014  . HCV (hepatitis C virus) 12/25/2014  . BRONCHITIS, ACUTE 11/01/2009    Past Surgical History:  Procedure Laterality Date  . BACK SURGERY  12/19/2011  . COLONOSCOPY WITH PROPOFOL N/A 09/25/2015   Procedure: COLONOSCOPY WITH PROPOFOL;  Surgeon: Lollie Sails, MD;  Location: [facility] ENDOSCOPY;  Service: Endoscopy;  Laterality: N/A;  . ESOPHAGOGASTRODUODENOSCOPY N/A 04/14/2015   Procedure: ESOPHAGOGASTRODUODENOSCOPY (EGD);  Surgeon: Lollie Sails, MD;  Location: [facility] ENDOSCOPY;  Service: Endoscopy;  Laterality: N/A;  . ESOPHAGOGASTRODUODENOSCOPY (EGD) WITH PROPOFOL N/A 09/16/2015   Procedure: ESOPHAGOGASTRODUODENOSCOPY (EGD) WITH PROPOFOL;  Surgeon: Lollie Sails, MD;  Location: [facility] ENDOSCOPY;  Service: Endoscopy;  Laterality: N/A;  . HERNIA REPAIR N/A 10/2015   abdominal   . TONSILLECTOMY    . TUBAL LIGATION    . UMBILICAL HERNIA REPAIR N/A 11/06/2015   Procedure: HERNIA REPAIR UMBILICAL ADULT;  Surgeon: [name], MD;  Location: [facility] ORS;  Service: General;  Laterality: N/A;    Prior to Admission medications   Medication Sig Start Date End Date Taking? Authorizing Provider  calcium elemental as carbonate (BARIATRIC TUMS ULTRA) 400 MG chewable tablet Chew by mouth.   Yes Historical Provider, MD  ciprofloxacin (CIPRO) 500 MG tablet Take 500 mg by mouth 2 (two) times a week. Monday and friday   Yes Historical Provider, MD  citalopram (CELEXA) 20 MG tablet Take 20 mg by mouth daily. 11/10/15  Yes Historical Provider, MD  furosemide (LASIX) 40 MG tablet Take 20 mg by mouth 2 (two) times daily.  12/15/15  Yes Historical Provider, MD  gabapentin (NEURONTIN) 300 MG capsule Take 400 mg by mouth 3 (three) times daily. 11/28/15  Yes Historical Provider, MD  lactulose  (CHRONULAC) 10 GM/15ML solution Take 10 g by mouth 3 (three) times daily.    Yes Historical Provider, MD  Ledipasvir-Sofosbuvir (HARVONI) 90-400 MG TABS Take 1 tablet by mouth daily.   Yes Historical Provider, MD  magnesium oxide (MAG-OX) 400 MG tablet Take 400 mg by mouth daily.   Yes Historical Provider, MD  MEPHYTON 5 MG tablet Take 1 tablet by mouth 3 (three) times a week. Monday Wednesday friday 01/05/16  Yes Historical Provider, MD  metFORMIN (GLUCOPHAGE) 500 MG tablet Take 500 mg by mouth 2 (two) times daily with a meal.  07/25/15  Yes Historical Provider, MD  Multiple Vitamin (MULTIVITAMIN WITH MINERALS) TABS tablet Take 1 tablet by mouth daily.   Yes Historical Provider, MD  omeprazole (PRILOSEC) 20 MG capsule Take 40 mg by mouth daily.   Yes Historical Provider, MD  Oxycodone HCl 20 MG TABS Take 1 tablet by mouth every 6 (six) hours.  10/05/15  Yes Historical Provider, MD  pantoprazole (PROTONIX) 40 MG tablet Take 40 mg by mouth 2 (two) times daily.  05/29/15  Yes Historical Provider, MD  PARoxetine (PAXIL) 20 MG tablet Take 20 mg by mouth every morning. 12/09/15  Yes Historical Provider, MD  spironolactone (ALDACTONE) 100 MG tablet Take 50 mg by mouth 2 (two) times daily.  12/09/15  Yes Historical Provider, MD  sucralfate (CARAFATE) 1 GM/10ML suspension Take 1 g by mouth 3 (three) times daily as needed.  09/12/15 09/11/16 Yes Historical Provider, MD    Allergies Review of patient's allergies indicates no known allergies.  Family History  Problem Relation Age of Onset  . Stroke Mother   . Heart disease Mother   . [name] Father   . Breast cancer Sister 52  . Kidney disease Neg Hx   . Bladder Cancer Neg Hx     Social History Social History  Substance Use Topics  . Smoking status: Current Every Day Smoker    Packs/day: 0.50    Years: 40.00    Types: Cigarettes  . Smokeless tobacco: Never Used  . Alcohol use No     Comment: stopped drinking 05/12/15    Review of  Systems  Constitutional: Negative for fever. + confusion Eyes: Negative for visual changes. ENT: Negative for sore throat. Cardiovascular: Negative for chest pain. Respiratory: Negative for shortness of breath. Gastrointestinal: Negative for abdominal pain, vomiting or diarrhea. + melena Genitourinary: Negative for dysuria. Musculoskeletal: Negative for back pain. Skin: Negative for rash. Neurological: Negative for headaches, weakness or numbness.  ____________________________________________   PHYSICAL EXAM:  VITAL SIGNS: ED Triage Vitals  Enc Vitals Group     BP 01/20/16 1717 (!) 115/50     Pulse Rate 01/20/16 1717 (!) 103     Resp 01/20/16 1717 18     Temp 01/20/16 1717 98.3 F (36.8 C)     Temp Source 01/20/16 1717 Oral     SpO2 01/20/16 1717 100 %     Weight 01/20/16 1722 136 lb (61.7 kg)     Height 01/20/16 1722 '5\' 9"'$  (1.753 m)     Head Circumference --      Peak Flow --      Pain Score --      Pain Loc --      Pain Edu? --      Excl. in [place]? --     Constitutional: Alert and oriented. Well appearing and in no apparent distress. HEENT:      Head: Normocephalic and atraumatic.         Eyes: Conjunctivae are normal. Sclera is icteric. EOMI. PERRL      Mouth/Throat: Mucous membranes are moist.       Neck: Supple with no signs of meningismus. Cardiovascular: Tachycardic with regular rhythm. No murmurs, gallops, or rubs. 2+ symmetrical distal pulses are present in all extremities. No JVD. Respiratory: Normal respiratory effort. Lungs are clear to auscultation bilaterally. No wheezes, crackles, or rhonchi.  Gastrointestinal: Soft, non tender, and non distended with positive bowel sounds. No rebound or guarding. Genitourinary: No CVA tenderness. Rectal exam showing dark red stool, grossly hemoccult positive. Musculoskeletal: Nontender with normal range of motion in all extremities. No edema, cyanosis, or erythema of extremities. Neurologic: Normal speech and language.  Face is symmetric. Moving all extremities. No gross focal neurologic deficits are appreciated. No asterixis Skin: Skin is jaundiced. Psychiatric: Mood and affect are normal. Speech and behavior are normal.  ____________________________________________   LABS (all labs ordered are listed, but only abnormal results are displayed)  Labs Reviewed  CBC WITH DIFFERENTIAL/PLATELET - Abnormal; Notable for the following:       Result Value   WBC 14.1 (*)    RBC 1.92 (*)  Hemoglobin 5.9 (*)    HCT 18.2 (*)    RDW 20.9 (*)    Neutro Abs 9.1 (*)    Monocytes Absolute 1.5 (*)    All other components within normal limits  COMPREHENSIVE METABOLIC PANEL - Abnormal; Notable for the following:    Sodium 129 (*)    Chloride 99 (*)    CO2 20 (*)    Glucose, Bld 246 (*)    BUN 21 (*)    All other components within normal limits  PROTIME-INR - Abnormal; Notable for the following:    Prothrombin Time 16.1 (*)    All other components within normal limits  URINALYSIS COMPLETEWITH MICROSCOPIC ([facility] ONLY) - Abnormal; Notable for the following:    Color, Urine STRAW (*)    APPearance CLEAR (*)    Squamous Epithelial / LPF 0-5 (*)    All other components within normal limits  APTT  AMMONIA  TYPE AND SCREEN  PREPARE RBC (CROSSMATCH)   ____________________________________________  EKG  ED ECG REPORT I, Rudene Re, the attending physician, personally viewed and interpreted this ECG.  NSR, low voltage, normal intervals, normal axis, no STE. Unchanged from prior ____________________________________________  RADIOLOGY  none ____________________________________________   PROCEDURES  Procedure(s) performed: None Procedures Critical Care performed:  Yes  CRITICAL CARE Performed by: Rudene Re  ?  Total critical care time: 40 min  Critical care time was exclusive of separately billable procedures and treating other patients.  Critical care was necessary to treat or  prevent imminent or life-threatening deterioration.  Critical care was time spent personally by me on the following activities: development of treatment plan with patient and/or surrogate as well as nursing, discussions with consultants, evaluation of patient's response to treatment, examination of patient, obtaining history from patient or surrogate, ordering and performing treatments and interventions, ordering and review of laboratory studies, ordering and review of radiographic studies, pulse oximetry and re-evaluation of patient's condition.  ____________________________________________   INITIAL IMPRESSION / ASSESSMENT AND PLAN / ED COURSE  55 y.o. female with a history of hepatitis C and alcoholic cirrhosis of the liver without ascites or varices and diverticulosis who presents for evaluation two small BM with blood seen yesterday. Patient also more confused and has not have a bowel movement greater than 24 hours even though she's been taking lactulose. She has scleral icterus and jaundice but no asterixis, she is alert and oriented 3 with a GCS of 15. Rectal exam showing dark red stool Hemoccult positive and no melena. Abdominal exam is soft and nontender. Presentation concerning for possible diverticular bleed. We'll check ammonia level for the confusion to rule out hepatic encephalopathy. We'll check CBC, CMP, coags. Anticipate transfer as we don't have GI on-call today.  Clinical Course  Comment By Time  Hemoglobin of 5.9. Patient has been consented for blood transfusion. 2 units have been ordered. We have already contacted [facility] for transfer. INR 1.28.Patient HD stable. Ammonia WNL Rudene Re, MD 10/24 1932  Patient accepted by Dr. Hal Hope from transfer to [facility]. Will transfuse before she is transferred. VS improved with IVF. Ceftriaxone will be given. Ammonia WNL. Rudene Re, MD 10/24 1958    Pertinent labs & imaging results that were available during my care of the  patient were reviewed by me and considered in my medical decision making (see chart for details).    ____________________________________________   FINAL CLINICAL IMPRESSION(S) / ED DIAGNOSES  Final diagnoses:  Lower GI bleed  Anemia, unspecified type  Hyponatremia  NEW MEDICATIONS STARTED DURING THIS VISIT:  Discharge Medication List as of 01/20/2016  9:52 PM       Note:  This document was prepared using Dragon voice recognition software and may include unintentional dictation errors.    Rudene Re, MD 01/23/16 [phone]

## 2016-01-20 NOTE — Progress Notes (Signed)
Pts Blood product finished and vitals assessed, no signs or symptoms of reaction. Unable to mark as finished in blood administration flowchart

## 2016-01-20 NOTE — ED Triage Notes (Signed)
Pt to ED from [facility] c/o bloody stools.  States stool has been dark.  Reports hx of transfusions and similar feelings.  Pt reports nausea, vomiting x1 today, and weakness.  Pt is A&Ox4, speaking in complete and coherent sentences, chest rise even and unlabored.  Pt PCP is Jan-sie.

## 2016-01-20 NOTE — ED Notes (Signed)
Pt uprite on stretcher in exam room watching TV; pt voices good understanding of plan of care and st her blood consent was signed; voices good understanding of transfusion to be performed as well and reactions to be reported to nurse; st has had transfusion in past with no complications; orders reviewed from chart

## 2016-01-21 ENCOUNTER — Encounter (HOSPITAL_COMMUNITY): Payer: Self-pay | Admitting: Internal Medicine

## 2016-01-21 DIAGNOSIS — K921 Melena: Principal | ICD-10-CM

## 2016-01-21 DIAGNOSIS — E440 Moderate protein-calorie malnutrition: Secondary | ICD-10-CM | POA: Insufficient documentation

## 2016-01-21 DIAGNOSIS — D62 Acute posthemorrhagic anemia: Secondary | ICD-10-CM | POA: Diagnosis present

## 2016-01-21 DIAGNOSIS — G894 Chronic pain syndrome: Secondary | ICD-10-CM

## 2016-01-21 DIAGNOSIS — K7469 Other cirrhosis of liver: Secondary | ICD-10-CM

## 2016-01-21 DIAGNOSIS — E1165 Type 2 diabetes mellitus with hyperglycemia: Secondary | ICD-10-CM

## 2016-01-21 DIAGNOSIS — B182 Chronic viral hepatitis C: Secondary | ICD-10-CM

## 2016-01-21 LAB — COMPREHENSIVE METABOLIC PANEL
ALT: 13 U/L — ABNORMAL LOW (ref 14–54)
AST: 23 U/L (ref 15–41)
Albumin: 2.9 g/dL — ABNORMAL LOW (ref 3.5–5.0)
Alkaline Phosphatase: 43 U/L (ref 38–126)
Anion gap: 8 (ref 5–15)
BUN: 14 mg/dL (ref 6–20)
CO2: 22 mmol/L (ref 22–32)
Calcium: 8.4 mg/dL — ABNORMAL LOW (ref 8.9–10.3)
Chloride: 101 mmol/L (ref 101–111)
Creatinine, Ser: 0.66 mg/dL (ref 0.44–1.00)
GFR calc Af Amer: >60 mL/min (ref >60)
GFR calc non Af Amer: >60 mL/min (ref >60)
Glucose, Bld: 113 mg/dL — ABNORMAL HIGH (ref 65–99)
Potassium: 4.1 mmol/L (ref 3.5–5.1)
Sodium: 131 mmol/L — ABNORMAL LOW (ref 135–145)
Total Bilirubin: 0.9 mg/dL (ref 0.3–1.2)
Total Protein: 5.7 g/dL — ABNORMAL LOW (ref 6.5–8.1)

## 2016-01-21 LAB — CBC
HCT: 20.1 % — ABNORMAL LOW (ref 36.0–46.0)
HCT: 20.7 % — ABNORMAL LOW (ref 36.0–46.0)
Hemoglobin: 7 g/dL — ABNORMAL LOW (ref 12.0–15.0)
Hemoglobin: 7.1 g/dL — ABNORMAL LOW (ref 12.0–15.0)
MCH: 29.5 pg (ref 26.0–34.0)
MCH: 29.2 pg (ref 26.0–34.0)
MCHC: 34.8 g/dL (ref 30.0–36.0)
MCHC: 34.3 g/dL (ref 30.0–36.0)
MCV: 84.8 fL (ref 78.0–100.0)
MCV: 85.2 fL (ref 78.0–100.0)
Platelets: 96 10*3/uL — ABNORMAL LOW (ref 150–400)
Platelets: 105 10*3/uL — ABNORMAL LOW (ref 150–400)
RBC: 2.37 MIL/uL — ABNORMAL LOW (ref 3.87–5.11)
RBC: 2.43 MIL/uL — ABNORMAL LOW (ref 3.87–5.11)
RDW: 20.8 % — ABNORMAL HIGH (ref 11.5–15.5)
RDW: 21.4 % — ABNORMAL HIGH (ref 11.5–15.5)
WBC: 8.5 10*3/uL (ref 4.0–10.5)
WBC: 7.2 10*3/uL (ref 4.0–10.5)

## 2016-01-21 LAB — GLUCOSE, CAPILLARY
Glucose-Capillary: 114 mg/dL — ABNORMAL HIGH (ref 65–99)
Glucose-Capillary: 115 mg/dL — ABNORMAL HIGH (ref 65–99)
Glucose-Capillary: 119 mg/dL — ABNORMAL HIGH (ref 65–99)
Glucose-Capillary: 135 mg/dL — ABNORMAL HIGH (ref 65–99)
Glucose-Capillary: 171 mg/dL — ABNORMAL HIGH (ref 65–99)
Glucose-Capillary: 267 mg/dL — ABNORMAL HIGH (ref 65–99)

## 2016-01-21 LAB — PREPARE RBC (CROSSMATCH)

## 2016-01-21 LAB — HEMOGLOBIN AND HEMATOCRIT, BLOOD
HCT: 23.2 % — ABNORMAL LOW (ref 36.0–46.0)
Hemoglobin: 7.8 g/dL — ABNORMAL LOW (ref 12.0–15.0)

## 2016-01-21 LAB — MRSA PCR SCREENING: MRSA by PCR: NEGATIVE

## 2016-01-21 MED ORDER — ACETAMINOPHEN 650 MG RE SUPP: 650.0000 mg | Freq: Four times a day (QID) | RECTAL | Status: DC | PRN

## 2016-01-21 MED ORDER — MORPHINE SULFATE (PF) 2 MG/ML IV SOLN
2.0000 mg | INTRAVENOUS | Status: DC | PRN
  Administered 2016-01-21 – 2016-01-22 (×3): 2 mg via INTRAVENOUS
  Filled 2016-01-21 (×3): qty 1

## 2016-01-21 MED ORDER — SODIUM CHLORIDE 0.9 % IV SOLN
Freq: Once | INTRAVENOUS | Status: AC
  Administered 2016-01-21: 02:00:00 via INTRAVENOUS

## 2016-01-21 MED ORDER — PHYTONADIONE 5 MG PO TABS
5.0000 mg | ORAL_TABLET | ORAL | Status: DC
  Administered 2016-01-21: 5 mg via ORAL
  Filled 2016-01-21: qty 1

## 2016-01-21 MED ORDER — PANTOPRAZOLE SODIUM 40 MG IV SOLR: 40.0000 mg | Freq: Two times a day (BID) | INTRAVENOUS | Status: DC

## 2016-01-21 MED ORDER — GABAPENTIN 400 MG PO CAPS
400.0000 mg | ORAL_CAPSULE | Freq: Three times a day (TID) | ORAL | Status: DC
  Administered 2016-01-21 (×4): 400 mg via ORAL
  Filled 2016-01-21 (×4): qty 1

## 2016-01-21 MED ORDER — SODIUM CHLORIDE 0.9 % IV SOLN
8.0000 mg/h | INTRAVENOUS | Status: DC
  Administered 2016-01-21 – 2016-01-22 (×2): 8 mg/h via INTRAVENOUS
  Filled 2016-01-21 (×8): qty 80

## 2016-01-21 MED ORDER — LEDIPASVIR-SOFOSBUVIR 90-400 MG PO TABS
1.0000 | ORAL_TABLET | Freq: Every day | ORAL | Status: DC
  Filled 2016-01-21: qty 1

## 2016-01-21 MED ORDER — ONDANSETRON HCL 4 MG PO TABS: 4.0000 mg | ORAL_TABLET | Freq: Four times a day (QID) | ORAL | Status: DC | PRN

## 2016-01-21 MED ORDER — ACETAMINOPHEN 325 MG PO TABS: 650.0000 mg | ORAL_TABLET | Freq: Four times a day (QID) | ORAL | Status: DC | PRN

## 2016-01-21 MED ORDER — DEXTROSE 5 % IV SOLN
2.0000 g | INTRAVENOUS | Status: DC
  Administered 2016-01-21 – 2016-01-22 (×2): 2 g via INTRAVENOUS
  Filled 2016-01-21 (×3): qty 2

## 2016-01-21 MED ORDER — ONDANSETRON HCL 4 MG/2ML IJ SOLN: 4.0000 mg | Freq: Four times a day (QID) | INTRAMUSCULAR | Status: DC | PRN

## 2016-01-21 MED ORDER — SODIUM CHLORIDE 0.9 % IV SOLN
Freq: Once | INTRAVENOUS | Status: AC
  Administered 2016-01-21: 14:00:00 via INTRAVENOUS

## 2016-01-21 MED ORDER — PANTOPRAZOLE SODIUM 40 MG IV SOLR
80.0000 mg | Freq: Once | INTRAVENOUS | Status: AC
  Filled 2016-01-21: qty 80

## 2016-01-21 MED ORDER — INSULIN ASPART 100 UNIT/ML ~~LOC~~ SOLN
0.0000 [IU] | SUBCUTANEOUS | Status: DC
  Administered 2016-01-21: 5 [IU] via SUBCUTANEOUS
  Administered 2016-01-21: 2 [IU] via SUBCUTANEOUS
  Administered 2016-01-22: 1 [IU] via SUBCUTANEOUS

## 2016-01-21 MED ORDER — MORPHINE SULFATE (PF) 2 MG/ML IV SOLN
2.0000 mg | Freq: Once | INTRAVENOUS | Status: AC
  Administered 2016-01-21: 2 mg via INTRAVENOUS

## 2016-01-21 MED ORDER — SODIUM CHLORIDE 0.9 % IV SOLN
INTRAVENOUS | Status: DC
  Administered 2016-01-21 – 2016-01-22 (×3): via INTRAVENOUS

## 2016-01-21 MED ORDER — MORPHINE SULFATE (PF) 2 MG/ML IV SOLN
2.0000 mg | INTRAVENOUS | Status: DC | PRN
  Administered 2016-01-21 (×3): 2 mg via INTRAVENOUS
  Filled 2016-01-21 (×4): qty 1

## 2016-01-21 NOTE — Plan of Care (Signed)
Problem: Nutrition: Goal: Adequate nutrition will be maintained Outcome: Progressing Patient ate 100% clear liquid diet tonight and requesting snacks before NPO. Tolerating diet well.

## 2016-01-21 NOTE — Progress Notes (Signed)
I was helping the Primary Nurse this morning and when I was making my rounds, noted that patient took her own home med and encouraged not to take any med unless her nurse is aware of what she took.  She stated that she need to take her med and she already swallow the pill before I got to her and it was Harvoni.

## 2016-01-21 NOTE — H&P (Signed)
History and Physical    Audrey Garrett WUJ:[id number] DOB: [birth year] DOA: 01/20/2016  PCP: Volanda Napoleon, MD  Patient coming from: [facility].  Chief Complaint: Low hemoglobin.  HPI: Audrey Garrett is a 55 y.o. female with history of cirrhosis of liver, hepatitis C and chronic pain noticed some blood in the stools day before yesterday. Patient had gone to her PCP yesterday and had blood work done which showed low hemoglobin and was advised to go to the ER. At the ER at [facility] patient's hemoglobin was found to be around 5. ER physician had done rectal exam and found frank blood. Patient has not had any further GI bleed from day before yesterday. Denies any abdominal pain did have some nausea but no vomiting. Patient states she was recently started on medications for hepatitis C (Harvoni from October 10).   Since there was no GI coverage tonight patient was transferred to [facility]. Patient was initially having low normal blood pressure so fluid bolus was given. 2 units of packed red blood cell transfusion has been ordered and protonix started.   ED Course: Hemoglobin was found to be around 5 and 2 units of packed red blood cell transfusion ordered along with Protonix started.  Review of Systems: As per HPI, rest all negative.   Past Medical History:  Diagnosis Date  . Alcohol abuse   . Alcoholic cirrhosis of liver without ascites ([place])   . Anemia   . Anxiety   . Arthropathy   . Back pain   . Bronchitis   . Chronic hepatitis C ([place])   . Chronic LBP 12/25/2014   Overview:  Post extensive surgery   . Cirrhosis ([place])   . DDD (degenerative disc disease), lumbar 01/01/2015  . Depression   . Diabetes mellitus without complication ([place])   . Edema   . Fatigue   . Fibromyalgia   . High risk medications (not anticoagulants) long-term use   . Hyperglycemia   . Hypertension   . Left leg pain 01/01/2015  . Lumbago   . Lumbar  radicular pain 01/01/2015  . Muscle spasm   . Neck pain 01/01/2015  . Polyarthritis   . Reflux   . RLS (restless legs syndrome)   . Sacroiliac pain 01/01/2015  . Shoulder pain   . Sinusitis   . SOB (shortness of breath)   . Spine disorder   . Stress headaches   . Upper back pain 01/01/2015  . Urinary frequency   . Vitamin D deficiency disease     Past Surgical History:  Procedure Laterality Date  . BACK SURGERY  12/19/2011  . COLONOSCOPY WITH PROPOFOL N/A 09/25/2015   Procedure: COLONOSCOPY WITH PROPOFOL;  Surgeon: Lollie Sails, MD;  Location[facility] ENDOSCOPY;  Service: Endoscopy;  Laterality: N/A;  . ESOPHAGOGASTRODUODENOSCOPY N/A 04/14/2015   Procedure: ESOPHAGOGASTRODUODENOSCOPY (EGD);  Surgeon: Lollie Sails, MD;  Location: [facility] ENDOSCOPY;  Service: Endoscopy;  Laterality: N/A;  . ESOPHAGOGASTRODUODENOSCOPY (EGD) WITH PROPOFOL N/A 09/16/2015   Procedure: ESOPHAGOGASTRODUODENOSCOPY (EGD) WITH PROPOFOL;  Surgeon: Lollie Sails, MD;  Location: [facility] ENDOSCOPY;  Service: Endoscopy;  Laterality: N/A;  . HERNIA REPAIR N/A 10/2015   abdominal   . TONSILLECTOMY    . TUBAL LIGATION    . UMBILICAL HERNIA REPAIR N/A 11/06/2015   Procedure: HERNIA REPAIR UMBILICAL ADULT;  Surgeon: [name], MD;  Location: [facility] ORS;  Service: General;  Laterality: N/A;     reports that she has been  smoking Cigarettes.  She has a 20.00 pack-year smoking history. She has never used smokeless tobacco. She reports that she does not drink alcohol or use drugs.  No Known Allergies  Family History  Problem Relation Age of Onset  . Stroke Mother   . Heart disease Mother   . [name] Father   . Breast cancer Sister 29  . Kidney disease Neg Hx   . Bladder Cancer Neg Hx     Prior to Admission medications   Medication Sig Start Date End Date Taking? Authorizing Provider  calcium elemental as carbonate (BARIATRIC TUMS ULTRA) 400 MG chewable tablet Chew by mouth.    Historical Provider, MD    ciprofloxacin (CIPRO) 500 MG tablet Take 500 mg by mouth 2 (two) times a week. Monday and friday    Historical Provider, MD  citalopram (CELEXA) 20 MG tablet Take 20 mg by mouth daily. 11/10/15   Historical Provider, MD  furosemide (LASIX) 40 MG tablet Take 20 mg by mouth 2 (two) times daily.  12/15/15   Historical Provider, MD  gabapentin (NEURONTIN) 300 MG capsule Take 400 mg by mouth 3 (three) times daily. 11/28/15   Historical Provider, MD  lactulose (CHRONULAC) 10 GM/15ML solution Take 10 g by mouth 3 (three) times daily.     Historical Provider, MD  Ledipasvir-Sofosbuvir (HARVONI) 90-400 MG TABS Take 1 tablet by mouth daily.    Historical Provider, MD  magnesium oxide (MAG-OX) 400 MG tablet Take 400 mg by mouth daily.    Historical Provider, MD  MEPHYTON 5 MG tablet Take 1 tablet by mouth 3 (three) times a week. Monday Wednesday friday 01/05/16   Historical Provider, MD  metFORMIN (GLUCOPHAGE) 500 MG tablet Take 500 mg by mouth 2 (two) times daily with a meal.  07/25/15   Historical Provider, MD  Multiple Vitamin (MULTIVITAMIN WITH MINERALS) TABS tablet Take 1 tablet by mouth daily.    Historical Provider, MD  omeprazole (PRILOSEC) 20 MG capsule Take 40 mg by mouth daily.    Historical Provider, MD  Oxycodone HCl 20 MG TABS Take 1 tablet by mouth every 6 (six) hours.  10/05/15   Historical Provider, MD  pantoprazole (PROTONIX) 40 MG tablet Take 40 mg by mouth 2 (two) times daily.  05/29/15   Historical Provider, MD  PARoxetine (PAXIL) 20 MG tablet Take 20 mg by mouth every morning. 12/09/15   Historical Provider, MD  spironolactone (ALDACTONE) 100 MG tablet Take 50 mg by mouth 2 (two) times daily.  12/09/15   Historical Provider, MD  sucralfate (CARAFATE) 1 GM/10ML suspension Take 1 g by mouth 3 (three) times daily as needed.  09/12/15 09/11/16  Historical Provider, MD    Physical Exam: Vitals:   01/20/16 2257 01/21/16 0001  BP:  101/62  Pulse:  88  Resp:  17  Temp: 98.7 F (37.1 C) 98.5 F (36.9  C)  TempSrc: Oral Oral  SpO2:  100%  Weight:  61.7 kg (136 lb 1.6 oz)  Height:  '5\' 9"'$  (1.753 m)      Constitutional: Moderately built and nourished. Vitals:   01/20/16 2257 01/21/16 0001  BP:  101/62  Pulse:  88  Resp:  17  Temp: 98.7 F (37.1 C) 98.5 F (36.9 C)  TempSrc: Oral Oral  SpO2:  100%  Weight:  61.7 kg (136 lb 1.6 oz)  Height:  '5\' 9"'$  (1.753 m)   Eyes: Anicteric mild pallor. ENMT: No discharge from the ears eyes nose or mouth. Neck: No mass felt.  No neck rigidity. Respiratory: No rhonchi or crepitations. Cardiovascular: S1 and S2 heard. No murmurs appreciated. Abdomen: Soft nontender bowel sounds present. Musculoskeletal: No edema. No joint effusion. Skin: No rash. Skin appears warm. Neurologic: Alert awake oriented to time place and person. Moves all extremities. Psychiatric: Appears normal. Normal affect.   Labs on Admission: I have personally reviewed following labs and imaging studies  CBC:  Recent Labs Lab 01/20/16 1726  WBC 14.1*  NEUTROABS 9.1*  HGB 5.9*  HCT 18.2*  MCV 94.7  PLT 478   Basic Metabolic Panel:  Recent Labs Lab 01/20/16 1726  NA 129*  K 4.7  CL 99*  CO2 20*  GLUCOSE 246*  BUN 21*  CREATININE 0.78  CALCIUM 9.3   GFR: Estimated Creatinine Clearance: 78.3 mL/min (by C-G formula based on SCr of 0.78 mg/dL). Liver Function Tests:  Recent Labs Lab 01/20/16 1726  AST 33  ALT 14  ALKPHOS 51  BILITOT 0.6  PROT 7.2  ALBUMIN 3.5   No results for input(s): LIPASE, AMYLASE in the last 168 hours.  Recent Labs Lab 01/20/16 1832  AMMONIA 24   Coagulation Profile:  Recent Labs Lab 01/20/16 1726  INR 1.28   Cardiac Enzymes: No results for input(s): CKTOTAL, CKMB, CKMBINDEX, TROPONINI in the last 168 hours. BNP (last 3 results) No results for input(s): PROBNP in the last 8760 hours. HbA1C: No results for input(s): HGBA1C in the last 72 hours. CBG: No results for input(s): GLUCAP in the last 168  hours. Lipid Profile: No results for input(s): CHOL, HDL, LDLCALC, TRIG, CHOLHDL, LDLDIRECT in the last 72 hours. Thyroid Function Tests: No results for input(s): TSH, T4TOTAL, FREET4, T3FREE, THYROIDAB in the last 72 hours. Anemia Panel: No results for input(s): VITAMINB12, FOLATE, FERRITIN, TIBC, IRON, RETICCTPCT in the last 72 hours. Urine analysis:    Component Value Date/Time   COLORURINE STRAW (A) 01/20/2016 1947   APPEARANCEUR CLEAR (A) 01/20/2016 1947   APPEARANCEUR Hazy 01/06/2014 2238   LABSPEC 1.006 01/20/2016 1947   LABSPEC 1.015 01/06/2014 2238   PHURINE 6.0 01/20/2016 1947   GLUCOSEU NEGATIVE 01/20/2016 1947   GLUCOSEU Negative 01/06/2014 2238   HGBUR NEGATIVE 01/20/2016 1947   BILIRUBINUR NEGATIVE 01/20/2016 1947   BILIRUBINUR Negative 01/06/2014 2238   [place] 01/20/2016 1947   PROTEINUR NEGATIVE 01/20/2016 1947   NITRITE NEGATIVE 01/20/2016 1947   LEUKOCYTESUR NEGATIVE 01/20/2016 1947   LEUKOCYTESUR Negative 01/06/2014 2238   Sepsis Labs: '@LABRCNTIP'$ (procalcitonin:4,lacticidven:4) )No results found for this or any previous visit (from the past 240 hour(s)).   Radiological Exams on Admission: No results found.   Assessment/Plan Principal Problem:   Acute GI bleeding Active Problems:   HCV (hepatitis C virus)   Controlled type 2 diabetes mellitus with hyperglycemia (HCC)   Acute blood loss anemia    1. Acute GI bleed - source not clear, could be lower GI given the history of frank rectal bleeding. EGD and colonoscopy were done in June of this year for GI bleed. At the time colonoscopy had shown sigmoid and descending colon diverticulosis. EGD was negative for any varices or gastropathy. Patient has been placed on Protonix infusion and empiric antibiotics since patient has cirrhosis. 2 units of packed red blood cell transfusion has been ordered. Follow CBC. 2. Acute blood loss anemia - follow CBC. 3. Cirrhosis of the liver - since patient's blood  pressure is in the low-normal holding off diuretics for now. Patient is on empiric antibiotics. 4. History of hepatitis C being recently started  on Harvoni. 5. Diabetes mellitus type 2 with hyperglycemia - for now will keep patient on sliding scale coverage since patient is nothing by mouth. 6. Chronic pain - patient takes oxycodone at home so I have placed patient on IV morphine since patient is nothing by mouth.  Patient states she quit drinking alcohol since 2013.  DVT prophylaxis: SCDs. Code Status: Full code.  Family Communication: Discussed with patient.  Disposition Plan: Home.  Consults called: None.  Admission status: Inpatient.    Rise Patience MD [facility] Pager [phone].  If 7PM-7AM, please contact night-coverage [email or web address] Password [email or web address]  01/21/2016, 12:10 AM

## 2016-01-21 NOTE — Consult Note (Signed)
Consultation  Referring Provider: Dr. Sherral Hammers      Primary Care Physician:  Volanda Napoleon, MD Primary Gastroenterologist:   Dr. Gustavo Lah   [facility])   Reason for Consultation:  Anemia,  Hematochezia, Cirrhosis           HPI:   Audrey Garrett is a 55 y.o. Caucasian female with a past medical history of cirrhosis, hepatitis C, status post Incarcerated umbilical hernia repair in August 2017 and chronic pain who presented to the ED 01/20/16 with complaint of melena 2 days ago, nausea and an episode of vomiting.  Today, the patient is found laying in bed tearful, her husband is by her side and does assist with her history. She tells me that she was diagnosed with cirrhosis in February of this year which is thought due to hep C as well as alcohol. She recently started [facility] on October 10 for her hepatitis C. She had been doing fairly well after a recent "umbilical hernia repair", this was approximately 2 months ago, until 2 days ago when she noticed a "black and tarry" stool. She tells me this occurred 2 times that day. She did proceed to her PCP yesterday and had an episode of vomiting on the way there as well as while at the appointment. She denies any further nausea or vomiting today. She has had a decrease in appetite over the past 2-3 days. The patient has had no further bowel movements in the past 24 hours. She has been on lactulose 3 times a day as well as all of her diuretics.  Of note at one point during our conversation the patient asked me "what would happen to me if I just left here right now". We did discuss that there is a possibility that she could bleed out.  Patient denies fever, chills, bright red blood in her stool, heartburn, reflux, abdominal pain, diarrhea, use of NSAIDs or blood thinners.  ED course: Hemoglobin 5.9 at time of admission, down from 12.1 2 months ago, platelets low at 105  Previous GI workup: 01/05/16-office visit to [facility], [employer],  NP-C: Per notes they are following her for her Harvoni treatment, patient started 01/06/16-24 weeks planned August [id number] umbilical hernia: Patient underwent surgery 09/25/15-colonoscopy, Dr. Leanne Chang for GI bleed ;impression: Preparation of the colon was fair, one 10 mm polyp in the descending colon removed and diverticulosis in the sigmoid and descending colon; pathology: Tubular adenoma 09/18/15-EGD, Dr. Leanne Chang for heme-positive stool and melena; impression: Normal esophagus, nonbleeding erosive gastropathy, normal examined duodenum, Single localized 2 mm nonbleeding erosion in the prepyloric region of the stomach, no evidence of varices or esophagitis 04/04/15-EGD, Dr. Darleene Cleaver for heme-positive stool and melena; impression: Web at the cricopharyngeus, erosive gastritis, nonbleeding erosive gastropathy, small hiatal hernia, normal examined duodenum,Possibly very early grade 1 varices deep to the surface without evidence of complication, mild portal hypertensive gastropathy in the gastric body, single localized medium sized non-bleeding erosions in the prepyloric region of the stomach  Past Medical History:  Diagnosis Date  . Alcohol abuse   . Alcoholic cirrhosis of liver without ascites ([place])   . Anemia   . Anxiety   . Arthropathy   . Back pain   . Bronchitis   . Chronic hepatitis C ([place])   . Chronic LBP 12/25/2014   Overview:  Post extensive surgery   . Cirrhosis ([place])   . DDD (degenerative disc disease), lumbar 01/01/2015  . Depression   . Diabetes mellitus without complication (  HCC)   . Edema   . Fatigue   . Fibromyalgia   . High risk medications (not anticoagulants) long-term use   . Hyperglycemia   . Hypertension   . Left leg pain 01/01/2015  . Lumbago   . Lumbar radicular pain 01/01/2015  . Muscle spasm   . Neck pain 01/01/2015  . Polyarthritis   . Reflux   . RLS (restless legs syndrome)   . Sacroiliac pain 01/01/2015  . Shoulder pain   . Sinusitis     . SOB (shortness of breath)   . Spine disorder   . Stress headaches   . Upper back pain 01/01/2015  . Urinary frequency   . Vitamin D deficiency disease     Past Surgical History:  Procedure Laterality Date  . BACK SURGERY  12/19/2011  . COLONOSCOPY WITH PROPOFOL N/A 09/25/2015   Procedure: COLONOSCOPY WITH PROPOFOL;  Surgeon: Lollie Sails, MD;  Location[facility] ENDOSCOPY;  Service: Endoscopy;  Laterality: N/A;  . ESOPHAGOGASTRODUODENOSCOPY N/A 04/14/2015   Procedure: ESOPHAGOGASTRODUODENOSCOPY (EGD);  Surgeon: Lollie Sails, MD;  Location: [facility] ENDOSCOPY;  Service: Endoscopy;  Laterality: N/A;  . ESOPHAGOGASTRODUODENOSCOPY (EGD) WITH PROPOFOL N/A 09/16/2015   Procedure: ESOPHAGOGASTRODUODENOSCOPY (EGD) WITH PROPOFOL;  Surgeon: Lollie Sails, MD;  Location: [facility] ENDOSCOPY;  Service: Endoscopy;  Laterality: N/A;  . HERNIA REPAIR N/A 10/2015   abdominal   . TONSILLECTOMY    . TUBAL LIGATION    . UMBILICAL HERNIA REPAIR N/A 11/06/2015   Procedure: HERNIA REPAIR UMBILICAL ADULT;  Surgeon: [name], MD;  Location: [facility] ORS;  Service: General;  Laterality: N/A;    Family History  Problem Relation Age of Onset  . Stroke Mother   . Heart disease Mother   . [name] Father   . Breast cancer Sister 66  . Kidney disease Neg Hx   . Bladder Cancer Neg Hx     Social History  Substance Use Topics  . Smoking status: Current Every Day Smoker    Packs/day: 0.50    Years: 40.00    Types: Cigarettes  . Smokeless tobacco: Never Used  . Alcohol use No     Comment: stopped drinking 05/12/15    Prior to Admission medications   Medication Sig Start Date End Date Taking? Authorizing Provider  ciprofloxacin (CIPRO) 500 MG tablet Take 500 mg by mouth 2 (two) times a week. Monday and friday   Yes Historical Provider, MD  furosemide (LASIX) 20 MG tablet Take 20 mg by mouth 2 (two) times daily.  12/15/15  Yes Historical Provider, MD  gabapentin (NEURONTIN) 300 MG capsule Take 300 mg  by mouth 3 (three) times daily.  11/28/15  Yes Historical Provider, MD  lactulose (CHRONULAC) 10 GM/15ML solution Take 10 g by mouth 3 (three) times daily.    Yes Historical Provider, MD  magnesium oxide (MAG-OX) 400 MG tablet Take 400 mg by mouth daily.   Yes Historical Provider, MD  metFORMIN (GLUCOPHAGE) 500 MG tablet Take 500 mg by mouth 2 (two) times daily with a meal.  07/25/15  Yes Historical Provider, MD  Multiple Vitamin (MULTIVITAMIN WITH MINERALS) TABS tablet Take 1 tablet by mouth daily.   Yes Historical Provider, MD  Oxycodone HCl 20 MG TABS Take 1 tablet by mouth every 6 (six) hours.  10/05/15  Yes Historical Provider, MD  oxyCODONE-acetaminophen (PERCOCET/ROXICET) 5-325 MG tablet Take 1-2 tablets by mouth every 4 (four) hours as needed for severe pain.   Yes Historical Provider, MD  pantoprazole ([place])  40 MG tablet Take 40 mg by mouth 2 (two) times daily.  05/29/15  Yes Historical Provider, MD  Ledipasvir-Sofosbuvir (HARVONI) 90-400 MG TABS Take 1 tablet by mouth daily.    Historical Provider, MD  omeprazole (PRILOSEC) 20 MG capsule Take 40 mg by mouth daily.    Historical Provider, MD  PARoxetine (PAXIL) 20 MG tablet Take 20 mg by mouth every morning. 12/09/15   Historical Provider, MD  spironolactone (ALDACTONE) 100 MG tablet Take 50 mg by mouth 2 (two) times daily.  12/09/15   Historical Provider, MD  sucralfate (CARAFATE) 1 GM/10ML suspension Take 1 g by mouth 3 (three) times daily as needed.  09/12/15 09/11/16  Historical Provider, MD    Current Facility-Administered Medications  Medication Dose Route Frequency Provider Last Rate Last Dose  . 0.9 %  sodium chloride infusion   Intravenous Continuous Rise Patience, MD 10 mL/hr at 01/21/16 0159    . acetaminophen (TYLENOL) tablet 650 mg  650 mg Oral Q6H PRN Rise Patience, MD       Or  . acetaminophen (TYLENOL) suppository 650 mg  650 mg Rectal Q6H PRN Rise Patience, MD      . cefTRIAXone (ROCEPHIN) 2 g in dextrose 5 %  50 mL IVPB  2 g Intravenous Q24H Erenest Blank, RPH   2 g at 01/21/16 0159  . gabapentin (NEURONTIN) capsule 400 mg  400 mg Oral TID Rise Patience, MD   400 mg at 01/21/16 1007  . Ledipasvir-Sofosbuvir 90-400 MG TABS 1 tablet  1 tablet Oral Daily Rise Patience, MD      . morphine 2 MG/ML injection 2 mg  2 mg Intravenous Q4H PRN Rise Patience, MD   2 mg at 01/21/16 1059  . ondansetron (ZOFRAN) tablet 4 mg  4 mg Oral Q6H PRN Rise Patience, MD       Or  . ondansetron [facility]) injection 4 mg  4 mg Intravenous Q6H PRN Rise Patience, MD      . pantoprazole (PROTONIX) 80 mg in sodium chloride 0.9 % 250 mL (0.32 mg/mL) infusion  8 mg/hr Intravenous Continuous Rise Patience, MD 25 mL/hr at 01/21/16 0015 8 mg/hr at 01/21/16 0015  . [START ON 01/24/2016] pantoprazole (PROTONIX) injection 40 mg  40 mg Intravenous Q12H Rise Patience, MD      . phytonadione (VITAMIN K) tablet 5 mg  5 mg Oral Once per day on Mon Wed Fri Rise Patience, MD   5 mg at 01/21/16 1008    Allergies as of 01/20/2016  . (No Known Allergies)     Review of Systems:     Constitutional: Positive for fatigue No weight loss, fever, chills or weakness HEENT: Eyes: No change in vision               Ears, Nose, Throat:  No change in hearing or congestion Skin: No rash or itching Cardiovascular: No chest pain, chest pressure or palpitations   Respiratory: No SOB or cough Gastrointestinal: See HPI and otherwise negative Genitourinary: No dysuria or change in urinary frequency Neurological: No headache, dizziness or syncope Musculoskeletal: No new muscle or joint pain Hematologic: No bleeding or bruising Psychiatric: Positive history for depression and anxiety    Physical Exam:  Vital signs in last 24 hours: Temp:  [98 F (36.7 C)-99.2 F (37.3 C)] 98.3 F (36.8 C) (10/25 0800) Pulse Rate:  [88-105] 93 (10/25 0800) Resp:  [15-21] 18 (10/25 0800) BP: (  93-117)/(47-70) 94/47 (10/25  0800) SpO2:  [95 %-100 %] 95 % (10/25 0800) Weight:  [136 lb (61.7 kg)-136 lb 1.6 oz (61.7 kg)] 136 lb 1.6 oz (61.7 kg) (10/25 0001) Last BM Date: 01/19/16 General: Caucasian female appears to be in NAD, Well developed, Well nourished, alert and cooperative Head:  Normocephalic and atraumatic. Eyes:   PEERL, EOMI. icteric Conjunctiva pink. Ears:  Normal auditory acuity. Neck:  Supple Throat: Oral cavity and pharynx without inflammation, swelling or lesion. Teeth in good condition. Lungs: Respirations even and unlabored. Lungs clear to auscultation bilaterally.   No wheezes, crackles, or rhonchi.  Heart: Normal S1, S2. No MRG. Regular rate and rhythm. No peripheral edema, cyanosis or pallor.  Abdomen:  Soft, nondistended, nontender. No rebound or guarding. Normal bowel sounds. No appreciable masses or hepatomegaly. Rectal:  Not performed by me due to patient's tearfulness. In ED rectal exam showed dark red stool, grossly Hemoccult positive  Msk:  Symmetrical without gross deformities. Peripheral pulses intact.  Extremities:  Without edema, no deformity or joint abnormality. Normal ROM, normal sensation. Neurologic:  Alert and  oriented x4;  grossly normal neurologically. CN II-XII intact.  Skin:   Dry and intact without significant lesions or rashes. Psychiatric: Oriented to person, place and time. Tearful, frustrated   LAB RESULTS:  Recent Labs  01/20/16 1726 01/21/16 0600 01/21/16 0958  WBC 14.1* 8.5 7.2  HGB 5.9* 7.0* 7.1*  HCT 18.2* 20.1* 20.7*  PLT 162 96* 105*   BMET  Recent Labs  01/20/16 1726 01/21/16 0404  NA 129* 131*  K 4.7 4.1  CL 99* 101  CO2 20* 22  GLUCOSE 246* 113*  BUN 21* 14  CREATININE 0.78 0.66  CALCIUM 9.3 8.4*   LFT  Recent Labs  01/21/16 0404  PROT 5.7*  ALBUMIN 2.9*  AST 23  ALT 13*  ALKPHOS 43  BILITOT 0.9   PT/INR  Recent Labs  01/20/16 1726  LABPROT 16.1*  INR 1.28    STUDIES: No results found.   PREVIOUS ENDOSCOPIES:             See HPI   Impression / Plan:   Impression: 1. Acute GI bleed: EGD and colonoscopy done in June of this year for GI bleed with no specific findings, report of Melena  2 days ago, hemoglobin was low at 5.9 at time of admission, 2 units PRBCs transfused, hemoglobin increased to 7.1 currently;No varices seen at time of last EGD, there was a prepyloric erosion with no stigmata of bleeding question significance, likely this represents an upper GI bleed 2. Acute blood loss anemia: As above 3. Cirrhosis of the liver: Followed in [place] 4. History of hepatitis C: Patient has recently been started on Harvoni 5. Chronic pain: Patient on oxycodone at home  Plan: 1. Patient would likely benefit from an EGD for further evaluation of her recent melenic stool. Discussed risks, benefits, limitations and alternatives to this procedure and the patient agrees to proceed if necessary. 2. Start clears, NPO after midnight for EGD tomorrow 3. Continue to monitor hemoglobin every 6 hours with transfusion as needed for hemoglobin less than 7, will order one more u prbcs now 4. Continue patient's current home medications 5. Continue supportive measures 6. Agree with Protonix 40 mg twice a day 7.  Will discuss above with Dr. Loletha Carrow, please await any further recommendations  Thank you for your kind consultation, we will continue to follow.  Lavone Nian Lemmon  01/21/2016, 11:05 AM Pager #: [phone]  I have reviewed the entire case in detail with the above APP and discussed the plan in detail.  Therefore, I agree with the diagnoses recorded above. In addition,  I have personally interviewed and examined the patient and have personally reviewed any abdominal/pelvic CT scan images.  My additional thoughts are as follows:  Cirrhosis with ongoing anemia.  Suspect this is acute on chronic, and that she has probably been slowly losing blood even since the June endoscopic workup, with a more acute drop in  the last few days with melena passage. No source found on her June egd/colon, despite her belief that the "3 inch polyp" (really 86m) was the source. She is stable.  Plans are for EGD tomorrow.  If neg and bleeding has stopped, plans will be for [facility]   HNelida MeuseIII Pager [phone] Mon-Fri 8a-5p [phone] 5p, weekends, holidays

## 2016-01-21 NOTE — Plan of Care (Signed)
Problem: Pain Managment: Goal: General experience of comfort will improve Outcome: Progressing Patient now understands why she was not scheduled oxycodone home medication due to NPO status. Pain well controlled with ordered medication.

## 2016-01-21 NOTE — Progress Notes (Signed)
Initial Nutrition Assessment  DOCUMENTATION CODES:   Non-severe (moderate) malnutrition in context of chronic illness  INTERVENTION:    Advance diet as medically appropriate, add supplements when/as able  NUTRITION DIAGNOSIS:   Inadequate oral intake related to inability to eat as evidenced by NPO status  GOAL:   Patient will meet greater than or equal to 90% of their needs  MONITOR:   Diet advancement, PO intake, Labs, Weight trends, I & O's  REASON FOR ASSESSMENT:   Malnutrition Screening Tool  ASSESSMENT:   55 y.o. Female with history of cirrhosis of liver, hepatitis C and chronic pain noticed some blood in the stools day before yesterday. Patient had gone to her PCP yesterday and had blood work done which showed low hemoglobin and was advised to go to the ER. At the ER at [facility] patient's hemoglobin was found to be around 5. ER physician had done rectal exam and found frank blood. Patient has not had any further GI bleed from day before yesterday. Denies any abdominal pain did have some nausea but no vomiting. Patient states she was recently started on medications for hepatitis C (Harvoni from October 10).   Pt reports a decreased appetite for about 2-3 days PTA. Currently on Clear Liquids >> hungry >> wants solids. Reveals she's had some unintentional wt loss. Per readings below, has had a 19% wt loss since 08/2015 (severe for time frame). Drinks Boost supplements at home >> amenable to supplements during hospital stay.  Nutrition-Focused physical exam completed. Findings are moderate fat depletion, moderate muscle depletion, and no edema.   Diet Order:  Diet clear liquid Room service appropriate? Yes; Fluid consistency: Thin Diet NPO time specified  Skin:  Reviewed, no issues  Last BM:  10/23   CBG (last 3)   Recent Labs  01/21/16 0433 01/21/16 0753 01/21/16 1152  GLUCAP 114* 135* 267*   Height:   Ht Readings from Last 1  Encounters:  01/21/16 '5\' 9"'$  (1.753 m)    Weight:   Wt Readings from Last 1 Encounters:  01/21/16 136 lb 1.6 oz (61.7 kg)   Wt Readings from Last 10 Encounters:  01/21/16 136 lb 1.6 oz (61.7 kg)  01/20/16 136 lb (61.7 kg)  12/29/15 144 lb (65.3 kg)  12/22/15 144 lb 6.4 oz (65.5 kg)  12/09/15 142 lb (64.4 kg)  11/24/15 145 lb (65.8 kg)  11/06/15 146 lb 14.4 oz (66.6 kg)  10/01/15 171 lb (77.6 kg)  09/26/15 168 lb 3.2 oz (76.3 kg)  09/16/15 171 lb (77.6 kg)   Ideal Body Weight:  65.9 kg  BMI:  Body mass index is 20.1 kg/m.  Estimated Nutritional Needs:   Kcal:  1650-1850  Protein:  80-90 gm  Fluid:  1.6-1.8 L  EDUCATION NEEDS:   No education needs identified at this time  Arthur Holms, RD, LDN Pager #: [phone] After-Hours Pager #: [phone]

## 2016-01-21 NOTE — Progress Notes (Signed)
PROGRESS NOTE    Audrey Garrett  OAC:[id number] DOB: 1961-03-22 DOA: 01/20/2016 PCP: Volanda Napoleon, MD   Brief Narrative:   Audrey Garrett is a 55 y.o. WF PMHx Anxiety,Depression, Fibromyalgia, ETOH Cirrhosis of Liver,S/P drain placement for cirrhosis and ascites- Chronic Hepatitis C, EtOH abuse Ventral hernia with obstruction and without gangrene S/P surgical repair~Aug 2017, Diverticulosis and polyps( 09/25/2015 [facility]) Candida, prepyloric ulcer (09/16/2015 [facility]),HTN, chronic pain syndrome (DDD, Chronic LBP), Anemia  Noticed some blood in the stools day before yesterday. Patient had gone to her PCP yesterday and had blood work done which showed low hemoglobin and was advised to go to the ER. At the ER at [facility] patient's hemoglobin was found to be around 5. ER physician had done rectal exam and found frank blood. Patient has not had any further GI bleed from day before yesterday. Denies any abdominal pain did have some nausea but no vomiting. Patient states she was recently started on medications for Hepatitis C (Harvoni from October 10).   Since there was no GI coverage tonight patient was transferred to [facility]. Patient was initially having low normal blood pressure so fluid bolus was given. 2 units of PRBC been ordered and protonix started.    Subjective: 10/25 A/O 4, NAD. States had~2 days of melenic stool with frank blood on the stool. Her surgery for hernia repair with bowel obstruction~August/2017 was told everything healed well. Negative abdominal pain, negative N/V. States was started on(Harvoni from October 10), at [facility] for her Hepatitis C. Has been seen one time at [facility] for evaluation for liver transplant   Assessment & Plan:   Principal Problem:   Acute GI bleeding Active Problems:   HCV (hepatitis C virus)   Controlled type 2 diabetes mellitus with hyperglycemia (HCC)   Acute blood loss  anemia  Acute GI bleed  - source not clear, could be lower GI given the history of frank rectal bleeding.  -EGD/colonoscopy June 2017 revealed diverticulosis and postpyloric ulcer performed at [facility]  -Ceftriaxone empirically for SBP and empiric antibiotics since patient has cirrhosis.  -2 units PRBC -Follow CBC. Recent Labs Lab 01/20/16 1726 01/21/16 0600  HGB 5.9* 7.0*    Acute blood loss anemia -See GI bleed -Consulted [facility] on 10/25  Hypotensive -Normal saline 75 ml/hr  Cirrhosis of Liver -Multifactorial EtOH abuse, and Chronic Hepatitis C -Harvoni 1 tablet daily  Diabetes type 2 controlled with complications - 0/10 Hemoglobin A1c= 5.3 -Sensitive SSI  Chronic pain syndrome -Morphine PRNl since patient is nothing by mouth.   DVT prophylaxis: SCD Code Status: Full Family Communication: Husband at bedside for discussion of plan of care Disposition Plan: Per GI   Consultants:  Sees Dr. Penny Pia PA. She at [facility] for a liver transplant .  Procedures/Significant Events:  None  Cultures   Antimicrobials: Harvoni 10/10>>   Devices    LINES / TUBES:  None    Continuous Infusions: . sodium chloride 10 mL/hr at 01/21/16 0159  . pantoprozole (PROTONIX) infusion 8 mg/hr (01/21/16 0015)     Objective: Vitals:   01/21/16 0001 01/21/16 0135 01/21/16 0400 01/21/16 0800  BP: 101/62 (!) 93/59  (!) 94/47  Pulse: 88   93  Resp: 17   18  Temp: 98.5 F (36.9 C) 98.6 F (37 C) 98 F (36.7 C) 98.3 F (36.8 C)  TempSrc: Oral Oral Oral Oral  SpO2: 100%   95%  Weight:  61.7 kg (136 lb 1.6 oz)     Height: '5\' 9"'$  (1.753 m)       Intake/Output Summary (Last 24 hours) at 01/21/16 1014 Last data filed at 01/21/16 0800  Gross per 24 hour  Intake           593.08 ml  Output              700 ml  Net          -106.92 ml   Filed Weights   01/21/16 0001  Weight: 61.7 kg (136 lb 1.6 oz)     Examination:  General: A/O 4, NAD, No acute respiratory distress Eyes: negative scleral hemorrhage, negative anisocoria, negative icterus ENT: Negative Runny nose, negative gingival bleeding, Neck:  Negative scars, masses, torticollis, lymphadenopathy, JVD Lungs: Clear to auscultation bilaterally without wheezes or crackles Cardiovascular: Regular rate and rhythm without murmur gallop or rub normal S1 and S2 Abdomen: negative abdominal pain, plus mild distention, positive soft, bowel sounds, no rebound, no ascites, no appreciable mass Extremities: No significant cyanosis, clubbing, or edema bilateral lower extremities Skin: Negative rashes, lesions, ulcers Psychiatric:  Negative depression, negative anxiety, negative fatigue, negative mania  Central nervous system:  Cranial nerves II through XII intact, tongue/uvula midline, all extremities muscle strength 5/5, sensation intact throughout, negative dysarthria, negative expressive aphasia, negative receptive aphasia.  .     Data Reviewed: Care during the described time interval was provided by me .  I have reviewed this patient's available data, including medical history, events of note, physical examination, and all test results as part of my evaluation. I have personally reviewed and interpreted all radiology studies.  CBC:  Recent Labs Lab 01/20/16 1726 01/21/16 0600  WBC 14.1* 8.5  NEUTROABS 9.1*  --   HGB 5.9* 7.0*  HCT 18.2* 20.1*  MCV 94.7 84.8  PLT 162 96*   Basic Metabolic Panel:  Recent Labs Lab 01/20/16 1726 01/21/16 0404  NA 129* 131*  K 4.7 4.1  CL 99* 101  CO2 20* 22  GLUCOSE 246* 113*  BUN 21* 14  CREATININE 0.78 0.66  CALCIUM 9.3 8.4*   GFR: Estimated Creatinine Clearance: 78.3 mL/min (by C-G formula based on SCr of 0.66 mg/dL). Liver Function Tests:  Recent Labs Lab 01/20/16 1726 01/21/16 0404  AST 33 23  ALT 14 13*  ALKPHOS 51 43  BILITOT 0.6 0.9  PROT 7.2 5.7*  ALBUMIN 3.5 2.9*    No results for input(s): LIPASE, AMYLASE in the last 168 hours.  Recent Labs Lab 01/20/16 1832  AMMONIA 24   Coagulation Profile:  Recent Labs Lab 01/20/16 1726  INR 1.28   Cardiac Enzymes: No results for input(s): CKTOTAL, CKMB, CKMBINDEX, TROPONINI in the last 168 hours. BNP (last 3 results) No results for input(s): PROBNP in the last 8760 hours. HbA1C: No results for input(s): HGBA1C in the last 72 hours. CBG:  Recent Labs Lab 01/21/16 0433 01/21/16 0753  GLUCAP 114* 135*   Lipid Profile: No results for input(s): CHOL, HDL, LDLCALC, TRIG, CHOLHDL, LDLDIRECT in the last 72 hours. Thyroid Function Tests: No results for input(s): TSH, T4TOTAL, FREET4, T3FREE, THYROIDAB in the last 72 hours. Anemia Panel: No results for input(s): VITAMINB12, FOLATE, FERRITIN, TIBC, IRON, RETICCTPCT in the last 72 hours. Urine analysis:    Component Value Date/Time   COLORURINE STRAW (A) 01/20/2016 1947   APPEARANCEUR CLEAR (A) 01/20/2016 1947   APPEARANCEUR Hazy 01/06/2014 2238   LABSPEC 1.006 01/20/2016 1947   LABSPEC 1.015  01/06/2014 2238   PHURINE 6.0 01/20/2016 1947   GLUCOSEU NEGATIVE 01/20/2016 1947   GLUCOSEU Negative 01/06/2014 2238   HGBUR NEGATIVE 01/20/2016 1947   BILIRUBINUR NEGATIVE 01/20/2016 1947   BILIRUBINUR Negative 01/06/2014 2238   [place] 01/20/2016 1947   PROTEINUR NEGATIVE 01/20/2016 1947   NITRITE NEGATIVE 01/20/2016 1947   LEUKOCYTESUR NEGATIVE 01/20/2016 1947   LEUKOCYTESUR Negative 01/06/2014 2238   Sepsis Labs: '@LABRCNTIP'$ (procalcitonin:4,lacticidven:4)  ) Recent Results (from the past 240 hour(s))  MRSA PCR Screening     Status: None   Collection Time: 01/20/16 10:37 PM  Result Value Ref Range Status   MRSA by PCR NEGATIVE NEGATIVE Final    Comment:        The GeneXpert MRSA Assay (FDA approved for NASAL specimens only), is one component of a comprehensive MRSA colonization surveillance program. It is not intended to  diagnose MRSA infection nor to guide or monitor treatment for MRSA infections.          Radiology Studies: No results found.      Scheduled Meds: . cefTRIAXone (ROCEPHIN)  IV  2 g Intravenous Q24H  . gabapentin  400 mg Oral TID  . Ledipasvir-Sofosbuvir  1 tablet Oral Daily  . [START ON 01/24/2016] pantoprazole  40 mg Intravenous Q12H  . phytonadione  5 mg Oral Once per day on Mon Wed Fri   Continuous Infusions: . sodium chloride 10 mL/hr at 01/21/16 0159  . pantoprozole (PROTONIX) infusion 8 mg/hr (01/21/16 0015)     LOS: 1 day    Time spent: 40 minutes    WOODS, Geraldo Docker, MD [facility] Pager [phone]   If 7PM-7AM, please contact night-coverage [email or web address] Password [email or web address] 01/21/2016, 10:14 AM

## 2016-01-21 NOTE — Progress Notes (Signed)
Patient arrived to unit via stretcher from [place] with [place]. PRBCs and Protonix drip infusing. RAC and LAC peripheral lines with no complications. Patient alert, oriented, vitals stable.

## 2016-01-21 NOTE — Plan of Care (Signed)
Problem: Education: Goal: Knowledge of [facility] Education information/materials will improve Outcome: Completed/Met Date Met: 01/21/16 Pt understands [place] policy and procedure. Pt able to ask questions and analyze answers.

## 2016-01-21 NOTE — Progress Notes (Signed)
Pharmacy Antibiotic Note  Audrey Garrett is a 55 y.o. female admitted on 01/20/2016 with intra-abdominal infection.  Pharmacy has been consulted for Ceftriaxone dosing. WBC mildly elevated. Pt having some bloody stools.   Plan: -Ceftriaxone 2g IV q24h -Trend WBC, temp -F/U infectious work-up  Height: '5\' 9"'$  (175.3 cm) Weight: 136 lb 1.6 oz (61.7 kg) IBW/kg (Calculated) : 66.2  Temp (24hrs), Avg:98.7 F (37.1 C), Min:98.3 F (36.8 C), Max:99.2 F (37.3 C)   Recent Labs Lab 01/20/16 1726  WBC 14.1*  CREATININE 0.78    Estimated Creatinine Clearance: 78.3 mL/min (by C-G formula based on SCr of 0.78 mg/dL).    No Known Allergies   Narda Bonds 01/21/2016 12:23 AM

## 2016-01-22 ENCOUNTER — Inpatient Hospital Stay (HOSPITAL_COMMUNITY): Payer: Medicare Other | Admitting: Anesthesiology

## 2016-01-22 ENCOUNTER — Encounter (HOSPITAL_COMMUNITY)
Admission: AD | Discharge status: Against Medical Advice | Payer: Self-pay | Source: Other Acute Inpatient Hospital | Attending: Internal Medicine

## 2016-01-22 ENCOUNTER — Encounter (HOSPITAL_COMMUNITY): Payer: Self-pay

## 2016-01-22 DIAGNOSIS — K922 Gastrointestinal hemorrhage, unspecified: Secondary | ICD-10-CM

## 2016-01-22 HISTORY — PX: ESOPHAGOGASTRODUODENOSCOPY (EGD) WITH PROPOFOL: SHX5813

## 2016-01-22 LAB — TYPE AND SCREEN
ABO/RH(D): O POS
ABO/RH(D): O POS
Antibody Screen: NEGATIVE
Antibody Screen: NEGATIVE
Unit division: 0
Unit division: 0
Unit division: 0
Unit division: 0

## 2016-01-22 LAB — BASIC METABOLIC PANEL
Anion gap: 6 (ref 5–15)
BUN: 8 mg/dL (ref 6–20)
CO2: 23 mmol/L (ref 22–32)
Calcium: 8.2 mg/dL — ABNORMAL LOW (ref 8.9–10.3)
Chloride: 106 mmol/L (ref 101–111)
Creatinine, Ser: 0.54 mg/dL (ref 0.44–1.00)
GFR calc Af Amer: >60 mL/min (ref >60)
GFR calc non Af Amer: >60 mL/min (ref >60)
Glucose, Bld: 106 mg/dL — ABNORMAL HIGH (ref 65–99)
Potassium: 4.2 mmol/L (ref 3.5–5.1)
Sodium: 135 mmol/L (ref 135–145)

## 2016-01-22 LAB — GLUCOSE, CAPILLARY
Glucose-Capillary: 116 mg/dL — ABNORMAL HIGH (ref 65–99)
Glucose-Capillary: 148 mg/dL — ABNORMAL HIGH (ref 65–99)

## 2016-01-22 LAB — MAGNESIUM: Magnesium: 1.9 mg/dL (ref 1.7–2.4)

## 2016-01-22 LAB — PROTIME-INR
INR: 1.32
Prothrombin Time: 16.5 seconds — ABNORMAL HIGH (ref 11.4–15.2)

## 2016-01-22 LAB — AMMONIA: Ammonia: 38 umol/L — ABNORMAL HIGH (ref 9–35)

## 2016-01-22 LAB — PREPARE RBC (CROSSMATCH)

## 2016-01-22 SURGERY — ESOPHAGOGASTRODUODENOSCOPY (EGD) WITH PROPOFOL
Anesthesia: Monitor Anesthesia Care

## 2016-01-22 MED ORDER — PROPOFOL 500 MG/50ML IV EMUL
INTRAVENOUS | Status: DC | PRN
  Administered 2016-01-22: 100 ug/kg/min via INTRAVENOUS

## 2016-01-22 MED ORDER — MEPERIDINE HCL 25 MG/ML IJ SOLN: 6.2500 mg | INTRAMUSCULAR | Status: DC | PRN

## 2016-01-22 MED ORDER — MIDAZOLAM HCL 2 MG/2ML IJ SOLN: 0.5000 mg | Freq: Once | INTRAMUSCULAR | Status: DC | PRN

## 2016-01-22 MED ORDER — SODIUM CHLORIDE 0.9 % IV SOLN: INTRAVENOUS | Status: DC

## 2016-01-22 MED ORDER — FENTANYL CITRATE (PF) 100 MCG/2ML IJ SOLN: 25.0000 ug | INTRAMUSCULAR | Status: DC | PRN

## 2016-01-22 MED ORDER — PROMETHAZINE HCL 25 MG/ML IJ SOLN: 6.2500 mg | INTRAMUSCULAR | Status: DC | PRN

## 2016-01-22 MED ORDER — BUTAMBEN-TETRACAINE-BENZOCAINE 2-2-14 % EX AERO
INHALATION_SPRAY | CUTANEOUS | Status: DC | PRN
  Administered 2016-01-22: 2 via TOPICAL

## 2016-01-22 MED ORDER — PROPOFOL 10 MG/ML IV BOLUS
INTRAVENOUS | Status: DC | PRN
  Administered 2016-01-22 (×2): 20 mg via INTRAVENOUS
  Administered 2016-01-22: 30 mg via INTRAVENOUS
  Administered 2016-01-22 (×2): 20 mg via INTRAVENOUS

## 2016-01-22 NOTE — Interval H&P Note (Signed)
History and Physical Interval Note:  01/22/2016 12:01 PM  Audrey Garrett  has presented today for surgery, with the diagnosis of Melena, Cirrhosis, Anemia  The various methods of treatment have been discussed with the patient and family. After consideration of risks, benefits and other options for treatment, the patient has consented to  Procedure(s): ESOPHAGOGASTRODUODENOSCOPY (EGD) WITH PROPOFOL (N/A) as a surgical intervention .  The patient's history has been reviewed, patient examined, no change in status, stable for surgery.  I have reviewed the patient's chart and labs.  Questions were answered to the patient's satisfaction.     [name]

## 2016-01-22 NOTE — Transfer of Care (Signed)
Immediate Anesthesia Transfer of Care Note  Patient: See [place]har[place]  Procedure(s) Performed: Procedure(s): ESOPHAGOGASTRODUODENOSCOPY (EGD) WITH PROPOFOL (N/A)  Patient Location: Endoscopy Unit  Anesthesia Type:MAC  Level of Consciousness: awake, oriented and patient cooperative  Airway & Oxygen Therapy: Patient Spontanous Breathing  Post-op Assessment: Report given to RN and Post -op Vital signs reviewed and stable  Post vital signs: Reviewed and stable  Last Vitals:  Vitals:   01/22/16 0733 01/22/16 1121  BP: (!) 97/52 98/80  Pulse:  79  Resp:  11  Temp: 37.1 C 37.1 C    Last Pain:  Vitals:   01/22/16 1121  TempSrc: Oral  PainSc:       Patients Stated Pain Goal: 0 (46/56/81 2751)  Complications: No apparent anesthesia complications

## 2016-01-22 NOTE — Op Note (Signed)
[facility] Patient Name: Audrey Garrett Procedure Date : 01/22/2016 MRN: [id number] Attending MD: Estill Cotta. Loletha Carrow , MD Date of Birth: May 09, 1960 CSN: [id number] Age: 55 Admit Type: Inpatient Procedure:                Upper GI endoscopy Indications:              Acute post hemorrhagic anemia, Melena Providers:                Mallie Mussel L. Loletha Carrow, MD, Carolynn Comment, RN, Elspeth Cho Tech., Technician, Claybon Jabs, CRNA Referring MD:              Medicines:                Monitored Anesthesia Care Complications:            No immediate complications. Estimated Blood Loss:     Estimated blood loss: none. Procedure:                Pre-Anesthesia Assessment:                           - Prior to the procedure, a History and Physical                            was performed, and patient medications and                            allergies were reviewed. The patient's tolerance of                            previous anesthesia was also reviewed. The risks                            and benefits of the procedure and the sedation                            options and risks were discussed with the patient.                            All questions were answered, and informed consent                            was obtained. Prior Anticoagulants: The patient has                            taken no previous anticoagulant or antiplatelet                            agents. ASA Grade Assessment: III - A patient with                            severe systemic disease. After reviewing the risks  and benefits, the patient was deemed in                            satisfactory condition to undergo the procedure.                           After obtaining informed consent, the endoscope was                            passed under direct vision. Throughout the                            procedure, the patient's blood pressure, pulse, and                     oxygen saturations were monitored continuously. The                            EC-[id number] ([id number]) scope was introduced through                            the mouth, and advanced to the second part of                            duodenum. The upper GI endoscopy was accomplished                            without difficulty. The patient tolerated the                            procedure well. An attempt was made to pass the                            pediatric colonoscope, with the intention of                            performing a small bowel push enteroscopy. However,                            a tight UES precluded passage of that scope. Scope In: Scope Out: Findings:      The esophagus was normal.      The stomach was normal.      The cardia and gastric fundus were normal on retroflexion.      The examined duodenum was normal. Impression:               - Normal esophagus.                           - Normal stomach.                           - Normal examined duodenum.                           - No specimens collected.  Rectal exam revealed maroon blood. Therefore, it                            seems that the patient has been having a lower GI                            bleed. This is most likely from right-sided colonic                            diverticulosis seen on 08/2015 colonoscopy at [facility]. Moderate Sedation:      MAC sedation used Recommendation:           - Resume regular diet.                           Serial Hgb/Hct.                           Colonoscopy tomorrow, as the last colonoscopy was a                            fair preparation.                           - Continue present medications. Procedure Code(s):        --- Professional ---                           [phone], Esophagogastroduodenoscopy, flexible,                            transoral; diagnostic, including collection of                            specimen(s) by  brushing or washing, when performed                            (separate procedure) Diagnosis Code(s):        --- Professional ---                           D62, Acute posthemorrhagic anemia                           K92.1, Melena (includes Hematochezia) CPT copyright 2016 American Medical Association. All rights reserved. The codes documented in this report are preliminary and upon coder review may  be revised to meet current compliance requirements. Cortney Mckinney L. Loletha Carrow, MD 01/22/2016 12:58:42 PM This report has been signed electronically. Number of Addenda: 0

## 2016-01-22 NOTE — H&P (View-Only) (Signed)
Consultation  Referring Provider: Dr. Sherral Hammers      Primary Care Physician:  Volanda Napoleon, MD Primary Gastroenterologist:   Dr. Gustavo Lah   [facility])   Reason for Consultation:  Anemia,  Hematochezia, Cirrhosis           HPI:   Audrey Garrett is a 55 y.o. Caucasian female with a past medical history of cirrhosis, hepatitis C, status post Incarcerated umbilical hernia repair in August 2017 and chronic pain who presented to the ED 01/20/16 with complaint of melena 2 days ago, nausea and an episode of vomiting.  Today, the patient is found laying in bed tearful, her husband is by her side and does assist with her history. She tells me that she was diagnosed with cirrhosis in February of this year which is thought due to hep C as well as alcohol. She recently started [facility] on October 10 for her hepatitis C. She had been doing fairly well after a recent "umbilical hernia repair", this was approximately 2 months ago, until 2 days ago when she noticed a "black and tarry" stool. She tells me this occurred 2 times that day. She did proceed to her PCP yesterday and had an episode of vomiting on the way there as well as while at the appointment. She denies any further nausea or vomiting today. She has had a decrease in appetite over the past 2-3 days. The patient has had no further bowel movements in the past 24 hours. She has been on lactulose 3 times a day as well as all of her diuretics.  Of note at one point during our conversation the patient asked me "what would happen to me if I just left here right now". We did discuss that there is a possibility that she could bleed out.  Patient denies fever, chills, bright red blood in her stool, heartburn, reflux, abdominal pain, diarrhea, use of NSAIDs or blood thinners.  ED course: Hemoglobin 5.9 at time of admission, down from 12.1 2 months ago, platelets low at 105  Previous GI workup: 01/05/16-office visit to [facility], [employer],  NP-C: Per notes they are following her for her Harvoni treatment, patient started 01/06/16-24 weeks planned August [id number] umbilical hernia: Patient underwent surgery 09/25/15-colonoscopy, Dr. Leanne Chang for GI bleed ;impression: Preparation of the colon was fair, one 10 mm polyp in the descending colon removed and diverticulosis in the sigmoid and descending colon; pathology: Tubular adenoma 09/18/15-EGD, Dr. Leanne Chang for heme-positive stool and melena; impression: Normal esophagus, nonbleeding erosive gastropathy, normal examined duodenum, Single localized 2 mm nonbleeding erosion in the prepyloric region of the stomach, no evidence of varices or esophagitis 04/04/15-EGD, Dr. Darleene Cleaver for heme-positive stool and melena; impression: Web at the cricopharyngeus, erosive gastritis, nonbleeding erosive gastropathy, small hiatal hernia, normal examined duodenum,Possibly very early grade 1 varices deep to the surface without evidence of complication, mild portal hypertensive gastropathy in the gastric body, single localized medium sized non-bleeding erosions in the prepyloric region of the stomach  Past Medical History:  Diagnosis Date  . Alcohol abuse   . Alcoholic cirrhosis of liver without ascites ([place])   . Anemia   . Anxiety   . Arthropathy   . Back pain   . Bronchitis   . Chronic hepatitis C ([place])   . Chronic LBP 12/25/2014   Overview:  Post extensive surgery   . Cirrhosis ([place])   . DDD (degenerative disc disease), lumbar 01/01/2015  . Depression   . Diabetes mellitus without complication (  HCC)   . Edema   . Fatigue   . Fibromyalgia   . High risk medications (not anticoagulants) long-term use   . Hyperglycemia   . Hypertension   . Left leg pain 01/01/2015  . Lumbago   . Lumbar radicular pain 01/01/2015  . Muscle spasm   . Neck pain 01/01/2015  . Polyarthritis   . Reflux   . RLS (restless legs syndrome)   . Sacroiliac pain 01/01/2015  . Shoulder pain   . Sinusitis     . SOB (shortness of breath)   . Spine disorder   . Stress headaches   . Upper back pain 01/01/2015  . Urinary frequency   . Vitamin D deficiency disease     Past Surgical History:  Procedure Laterality Date  . BACK SURGERY  12/19/2011  . COLONOSCOPY WITH PROPOFOL N/A 09/25/2015   Procedure: COLONOSCOPY WITH PROPOFOL;  Surgeon: Lollie Sails, MD;  Location[facility] ENDOSCOPY;  Service: Endoscopy;  Laterality: N/A;  . ESOPHAGOGASTRODUODENOSCOPY N/A 04/14/2015   Procedure: ESOPHAGOGASTRODUODENOSCOPY (EGD);  Surgeon: Lollie Sails, MD;  Location: [facility] ENDOSCOPY;  Service: Endoscopy;  Laterality: N/A;  . ESOPHAGOGASTRODUODENOSCOPY (EGD) WITH PROPOFOL N/A 09/16/2015   Procedure: ESOPHAGOGASTRODUODENOSCOPY (EGD) WITH PROPOFOL;  Surgeon: Lollie Sails, MD;  Location: [facility] ENDOSCOPY;  Service: Endoscopy;  Laterality: N/A;  . HERNIA REPAIR N/A 10/2015   abdominal   . TONSILLECTOMY    . TUBAL LIGATION    . UMBILICAL HERNIA REPAIR N/A 11/06/2015   Procedure: HERNIA REPAIR UMBILICAL ADULT;  Surgeon: [name], MD;  Location: [facility] ORS;  Service: General;  Laterality: N/A;    Family History  Problem Relation Age of Onset  . Stroke Mother   . Heart disease Mother   . [name] Father   . Breast cancer Sister 87  . Kidney disease Neg Hx   . Bladder Cancer Neg Hx     Social History  Substance Use Topics  . Smoking status: Current Every Day Smoker    Packs/day: 0.50    Years: 40.00    Types: Cigarettes  . Smokeless tobacco: Never Used  . Alcohol use No     Comment: stopped drinking 05/12/15    Prior to Admission medications   Medication Sig Start Date End Date Taking? Authorizing Provider  ciprofloxacin (CIPRO) 500 MG tablet Take 500 mg by mouth 2 (two) times a week. Monday and friday   Yes Historical Provider, MD  furosemide (LASIX) 20 MG tablet Take 20 mg by mouth 2 (two) times daily.  12/15/15  Yes Historical Provider, MD  gabapentin (NEURONTIN) 300 MG capsule Take 300 mg  by mouth 3 (three) times daily.  11/28/15  Yes Historical Provider, MD  lactulose (CHRONULAC) 10 GM/15ML solution Take 10 g by mouth 3 (three) times daily.    Yes Historical Provider, MD  magnesium oxide (MAG-OX) 400 MG tablet Take 400 mg by mouth daily.   Yes Historical Provider, MD  metFORMIN (GLUCOPHAGE) 500 MG tablet Take 500 mg by mouth 2 (two) times daily with a meal.  07/25/15  Yes Historical Provider, MD  Multiple Vitamin (MULTIVITAMIN WITH MINERALS) TABS tablet Take 1 tablet by mouth daily.   Yes Historical Provider, MD  Oxycodone HCl 20 MG TABS Take 1 tablet by mouth every 6 (six) hours.  10/05/15  Yes Historical Provider, MD  oxyCODONE-acetaminophen (PERCOCET/ROXICET) 5-325 MG tablet Take 1-2 tablets by mouth every 4 (four) hours as needed for severe pain.   Yes Historical Provider, MD  pantoprazole ([place])  40 MG tablet Take 40 mg by mouth 2 (two) times daily.  05/29/15  Yes Historical Provider, MD  Ledipasvir-Sofosbuvir (HARVONI) 90-400 MG TABS Take 1 tablet by mouth daily.    Historical Provider, MD  omeprazole (PRILOSEC) 20 MG capsule Take 40 mg by mouth daily.    Historical Provider, MD  PARoxetine (PAXIL) 20 MG tablet Take 20 mg by mouth every morning. 12/09/15   Historical Provider, MD  spironolactone (ALDACTONE) 100 MG tablet Take 50 mg by mouth 2 (two) times daily.  12/09/15   Historical Provider, MD  sucralfate (CARAFATE) 1 GM/10ML suspension Take 1 g by mouth 3 (three) times daily as needed.  09/12/15 09/11/16  Historical Provider, MD    Current Facility-Administered Medications  Medication Dose Route Frequency Provider Last Rate Last Dose  . 0.9 %  sodium chloride infusion   Intravenous Continuous Rise Patience, MD 10 mL/hr at 01/21/16 0159    . acetaminophen (TYLENOL) tablet 650 mg  650 mg Oral Q6H PRN Rise Patience, MD       Or  . acetaminophen (TYLENOL) suppository 650 mg  650 mg Rectal Q6H PRN Rise Patience, MD      . cefTRIAXone (ROCEPHIN) 2 g in dextrose 5 %  50 mL IVPB  2 g Intravenous Q24H Erenest Blank, RPH   2 g at 01/21/16 0159  . gabapentin (NEURONTIN) capsule 400 mg  400 mg Oral TID Rise Patience, MD   400 mg at 01/21/16 1007  . Ledipasvir-Sofosbuvir 90-400 MG TABS 1 tablet  1 tablet Oral Daily Rise Patience, MD      . morphine 2 MG/ML injection 2 mg  2 mg Intravenous Q4H PRN Rise Patience, MD   2 mg at 01/21/16 1059  . ondansetron (ZOFRAN) tablet 4 mg  4 mg Oral Q6H PRN Rise Patience, MD       Or  . ondansetron [facility] injection 4 mg  4 mg Intravenous Q6H PRN Rise Patience, MD      . pantoprazole (PROTONIX) 80 mg in sodium chloride 0.9 % 250 mL (0.32 mg/mL) infusion  8 mg/hr Intravenous Continuous Rise Patience, MD 25 mL/hr at 01/21/16 0015 8 mg/hr at 01/21/16 0015  . [START ON 01/24/2016] pantoprazole (PROTONIX) injection 40 mg  40 mg Intravenous Q12H Rise Patience, MD      . phytonadione (VITAMIN K) tablet 5 mg  5 mg Oral Once per day on Mon Wed Fri Rise Patience, MD   5 mg at 01/21/16 1008    Allergies as of 01/20/2016  . (No Known Allergies)     Review of Systems:     Constitutional: Positive for fatigue No weight loss, fever, chills or weakness HEENT: Eyes: No change in vision               Ears, Nose, Throat:  No change in hearing or congestion Skin: No rash or itching Cardiovascular: No chest pain, chest pressure or palpitations   Respiratory: No SOB or cough Gastrointestinal: See HPI and otherwise negative Genitourinary: No dysuria or change in urinary frequency Neurological: No headache, dizziness or syncope Musculoskeletal: No new muscle or joint pain Hematologic: No bleeding or bruising Psychiatric: Positive history for depression and anxiety    Physical Exam:  Vital signs in last 24 hours: Temp:  [98 F (36.7 C)-99.2 F (37.3 C)] 98.3 F (36.8 C) (10/25 0800) Pulse Rate:  [88-105] 93 (10/25 0800) Resp:  [15-21] 18 (10/25 0800) BP: (  93-117)/(47-70) 94/47 (10/25  0800) SpO2:  [95 %-100 %] 95 % (10/25 0800) Weight:  [136 lb (61.7 kg)-136 lb 1.6 oz (61.7 kg)] 136 lb 1.6 oz (61.7 kg) (10/25 0001) Last BM Date: 01/19/16 General: Caucasian female appears to be in NAD, Well developed, Well nourished, alert and cooperative Head:  Normocephalic and atraumatic. Eyes:   PEERL, EOMI. icteric Conjunctiva pink. Ears:  Normal auditory acuity. Neck:  Supple Throat: Oral cavity and pharynx without inflammation, swelling or lesion. Teeth in good condition. Lungs: Respirations even and unlabored. Lungs clear to auscultation bilaterally.   No wheezes, crackles, or rhonchi.  Heart: Normal S1, S2. No MRG. Regular rate and rhythm. No peripheral edema, cyanosis or pallor.  Abdomen:  Soft, nondistended, nontender. No rebound or guarding. Normal bowel sounds. No appreciable masses or hepatomegaly. Rectal:  Not performed by me due to patient's tearfulness. In ED rectal exam showed dark red stool, grossly Hemoccult positive  Msk:  Symmetrical without gross deformities. Peripheral pulses intact.  Extremities:  Without edema, no deformity or joint abnormality. Normal ROM, normal sensation. Neurologic:  Alert and  oriented x4;  grossly normal neurologically. CN II-XII intact.  Skin:   Dry and intact without significant lesions or rashes. Psychiatric: Oriented to person, place and time. Tearful, frustrated   LAB RESULTS:  Recent Labs  01/20/16 1726 01/21/16 0600 01/21/16 0958  WBC 14.1* 8.5 7.2  HGB 5.9* 7.0* 7.1*  HCT 18.2* 20.1* 20.7*  PLT 162 96* 105*   BMET  Recent Labs  01/20/16 1726 01/21/16 0404  NA 129* 131*  K 4.7 4.1  CL 99* 101  CO2 20* 22  GLUCOSE 246* 113*  BUN 21* 14  CREATININE 0.78 0.66  CALCIUM 9.3 8.4*   LFT  Recent Labs  01/21/16 0404  PROT 5.7*  ALBUMIN 2.9*  AST 23  ALT 13*  ALKPHOS 43  BILITOT 0.9   PT/INR  Recent Labs  01/20/16 1726  LABPROT 16.1*  INR 1.28    STUDIES: No results found.   PREVIOUS ENDOSCOPIES:             See HPI   Impression / Plan:   Impression: 1. Acute GI bleed: EGD and colonoscopy done in June of this year for GI bleed with no specific findings, report of Melena  2 days ago, hemoglobin was low at 5.9 at time of admission, 2 units PRBCs transfused, hemoglobin increased to 7.1 currently;No varices seen at time of last EGD, there was a prepyloric erosion with no stigmata of bleeding question significance, likely this represents an upper GI bleed 2. Acute blood loss anemia: As above 3. Cirrhosis of the liver: Followed in [place] 4. History of hepatitis C: Patient has recently been started on Harvoni 5. Chronic pain: Patient on oxycodone at home  Plan: 1. Patient would likely benefit from an EGD for further evaluation of her recent melenic stool. Discussed risks, benefits, limitations and alternatives to this procedure and the patient agrees to proceed if necessary. 2. Start clears, NPO after midnight for EGD tomorrow 3. Continue to monitor hemoglobin every 6 hours with transfusion as needed for hemoglobin less than 7, will order one more u prbcs now 4. Continue patient's current home medications 5. Continue supportive measures 6. Agree with Protonix 40 mg twice a day 7.  Will discuss above with Dr. Loletha Carrow, please await any further recommendations  Thank you for your kind consultation, we will continue to follow.  Lavone Nian Lemmon  01/21/2016, 11:05 AM Pager #: [phone]  I have reviewed the entire case in detail with the above APP and discussed the plan in detail.  Therefore, I agree with the diagnoses recorded above. In addition,  I have personally interviewed and examined the patient and have personally reviewed any abdominal/pelvic CT scan images.  My additional thoughts are as follows:  Cirrhosis with ongoing anemia.  Suspect this is acute on chronic, and that she has probably been slowly losing blood even since the June endoscopic workup, with a more acute drop in  the last few days with melena passage. No source found on her June egd/colon, despite her belief that the "3 inch polyp" (really 12m) was the source. She is stable.  Plans are for EGD tomorrow.  If neg and bleeding has stopped, plans will be for her [facility].   HNelida MeuseIII Pager [phone] Mon-Fri 8a-5p [phone] 5p, weekends, holidays

## 2016-01-22 NOTE — Discharge Summary (Signed)
@  LOGO@   PT LEFT AMA SUMMARY  Audrey Garrett MRN - [id number] DOB - 1960/07/12  Date of Admission - 01/20/2016 Date LEFT AMA: 01/22/2016  Attending Physician:  Joette Catching T  [name] PCP:  Volanda Napoleon, MD  Disposition: LEFT AMA  Follow-up Appts:  Not able to be arranged or discussed as pt LEFT AMA  Diagnoses at time pt LEFT AMA: Acute GI bleed of unclear source Acute blood loss anemia Cirrhosis of the liver Hepatitis C Uncontrolled DM 2 Chronic pain  Initial presentation: 56 y.o. female with history of cirrhosis of liver, hepatitis C and chronic pain noticed some blood in the stools. Patient had gone to her PCP and had blood work done which showed low hemoglobin and was advised to go to the ER. In the ER at [facility] patient's hemoglobin was found to be around 5. ER physician had done rectal exam and found frank blood.  Since there was no GI coverage the patient was transferred to [facility]. Patient was initially having low normal blood pressure so fluid bolus was given. 2 units of packed red blood cell transfusion were ordered and protonix started.   Hospital Course: Please note this patient was never seen by the dictating physician as she left AMA prior to my visit.  The patient was admitted to the acute units at [facility] and GI was consulted.  The GI physician performed an EGD on 01/22/2016 which revealed normal esophagus, normal stomach, and normal duodenum.  A rectal exam while in the endoscopy suite revealed maroon blood.  The patient was informed by the gastroenterologist that he was concerned she may be experiencing a right-sided colon GI bleed and that her previous colonoscopies had not provided sufficient enough information to determine if this had been previously evaluated or not.  She was sent back to the step down unit with the understanding that the medical team wish to perform a colonoscopy on 01/23/2016.  The  patient reported to the nurse that she did not wish to have a colonoscopy and that she was leaving the hospital [place].  She signed the AMA form and then left.    Medication List    Unable to be finalized as pt LEFT AMA  Day of Discharge Wt Readings from Last 3 Encounters:  01/21/16 61.7 kg (136 lb 1.6 oz)  01/20/16 61.7 kg (136 lb)  12/29/15 65.3 kg (144 lb)   Temp Readings from Last 3 Encounters:  01/22/16 98.7 F (37.1 C) (Oral)  01/20/16 99.2 F (37.3 C) (Oral)  12/29/15 98.5 F (36.9 C) (Oral)   BP Readings from Last 3 Encounters:  01/22/16 (!) 101/43  01/20/16 106/64  12/29/15 120/67   Pulse Readings from Last 3 Encounters:  01/22/16 79  01/20/16 96  12/29/15 80    Physical Exam: Exam not able to be completed at time of d/c as pt LEFT AMA  5:35 PM 01/22/16  Cherene Altes, MD [facility] Office  [phone] Pager [phone]  On-Call/Text Page:      [email or web address]      password [facility]

## 2016-01-22 NOTE — Anesthesia Preprocedure Evaluation (Addendum)
Anesthesia Evaluation  Patient identified by MRN, date of birth, ID band Patient awake    Reviewed: Allergy & Precautions, NPO status , [name] Chart, lab work & pertinent test results  History of Anesthesia Complications Negative for: history of anesthetic complications  Airway Mallampati: I  TM Distance: >3 FB Neck ROM: Full    Dental  (+) Edentulous Upper, Missing, Poor Dentition, Dental Advisory Given, Chipped   Pulmonary COPD, Current Smoker,    breath sounds clear to auscultation       Cardiovascular hypertension, Pt. on medications (-) angina Rhythm:Regular Rate:Normal     Neuro/Psych Anxiety Depression negative neurological ROS     GI/Hepatic negative GI ROS, (+) Cirrhosis     substance abuse  alcohol use, Hepatitis -, C  Endo/Other  diabetes (glu 148), Oral Hypoglycemic Agents  Renal/GU negative Renal ROS     Musculoskeletal   Abdominal   Peds  Hematology  (+) Blood dyscrasia (Hb 7.8, plt 105k), anemia ,   Anesthesia Other Findings   Reproductive/Obstetrics                            Anesthesia Physical Anesthesia Plan  ASA: III  Anesthesia Plan: MAC   Post-op Pain Management:    Induction: Intravenous  Airway Management Planned: Nasal Cannula and Natural Airway  Additional Equipment:   Intra-op Plan:   Post-operative Plan:   Informed Consent: I have reviewed the patients History and Physical, chart, labs and discussed the procedure including the risks, benefits and alternatives for the proposed anesthesia with the patient or authorized representative who has indicated his/her understanding and acceptance.   Dental advisory given  Plan Discussed with: CRNA and Surgeon  Anesthesia Plan Comments: (Plan routine monitors, MAC)        Anesthesia Quick Evaluation

## 2016-01-22 NOTE — Progress Notes (Signed)
I spoke with Ms Audrey Garrett and her husband just now after EGD.  I explained that I think she has probably been having a right-sided colonic diverticular bleed.  I also do not know if it has stopped. I strongly advised her to have a colonoscopy tomorrow (the procedure in June at [facility] reportedly had a "fair" prep, and there is no photo-documentation of the appendiceal orifice to ensure cecal intubation).    However, she is absolutely insistent on going home today, and could not be talked out of it.  We will alert the hospitalist physician.

## 2016-01-22 NOTE — Anesthesia Postprocedure Evaluation (Signed)
Anesthesia Post Note  Patient: Audrey Garrett  Procedure(s) Performed: Procedure(s) (LRB): ESOPHAGOGASTRODUODENOSCOPY (EGD) WITH PROPOFOL (N/A)  Patient location during evaluation: Endoscopy Anesthesia Type: MAC Level of consciousness: awake and alert, oriented and patient cooperative Pain management: pain level controlled Vital Signs Assessment: post-procedure vital signs reviewed and stable Respiratory status: spontaneous breathing, nonlabored ventilation and respiratory function stable Cardiovascular status: blood pressure returned to baseline and stable Postop Assessment: no signs of nausea or vomiting Anesthetic complications: no    Last Vitals:  Vitals:   01/22/16 1255 01/22/16 1305  BP: (!) 98/43 (!) 101/43  Pulse: 79 79  Resp: 13 18  Temp:      Last Pain:  Vitals:   01/22/16 1121  TempSrc: Oral  PainSc:                  Donyel Nester,E. Juanpablo Ciresi

## 2016-01-22 NOTE — Progress Notes (Signed)
Pt got back from EGD and she is so upset bec the doctor told her that she needs to have the colonoscopy tomorrow she said she does not want it done,she wants to go home, explained to her the importance of colonoscopy she said she does not need it, she said she will go for [facility].  MD made aware, the pt signed the AMA form. Home meds was returned from pharmacy.

## 2016-01-28 ENCOUNTER — Encounter: Payer: Self-pay | Admitting: Pain Medicine

## 2016-01-28 ENCOUNTER — Ambulatory Visit: Payer: Medicare Other | Attending: Pain Medicine | Admitting: Pain Medicine

## 2016-01-28 VITALS — BP 107/51 | HR 86 | Temp 98.5°F | Resp 16 | Ht 69.0 in | Wt 140.0 lb

## 2016-01-28 DIAGNOSIS — K420 Umbilical hernia with obstruction, without gangrene: Secondary | ICD-10-CM | POA: Insufficient documentation

## 2016-01-28 DIAGNOSIS — E1165 Type 2 diabetes mellitus with hyperglycemia: Secondary | ICD-10-CM | POA: Insufficient documentation

## 2016-01-28 DIAGNOSIS — G894 Chronic pain syndrome: Secondary | ICD-10-CM | POA: Diagnosis present

## 2016-01-28 DIAGNOSIS — M5412 Radiculopathy, cervical region: Secondary | ICD-10-CM | POA: Diagnosis not present

## 2016-01-28 DIAGNOSIS — K439 Ventral hernia without obstruction or gangrene: Secondary | ICD-10-CM | POA: Insufficient documentation

## 2016-01-28 DIAGNOSIS — M545 Low back pain: Secondary | ICD-10-CM | POA: Insufficient documentation

## 2016-01-28 DIAGNOSIS — D62 Acute posthemorrhagic anemia: Secondary | ICD-10-CM | POA: Diagnosis not present

## 2016-01-28 DIAGNOSIS — I10 Essential (primary) hypertension: Secondary | ICD-10-CM | POA: Insufficient documentation

## 2016-01-28 DIAGNOSIS — M4310 Spondylolisthesis, site unspecified: Secondary | ICD-10-CM

## 2016-01-28 DIAGNOSIS — K7030 Alcoholic cirrhosis of liver without ascites: Secondary | ICD-10-CM | POA: Insufficient documentation

## 2016-01-28 DIAGNOSIS — E1142 Type 2 diabetes mellitus with diabetic polyneuropathy: Secondary | ICD-10-CM | POA: Diagnosis not present

## 2016-01-28 DIAGNOSIS — Z79891 Long term (current) use of opiate analgesic: Secondary | ICD-10-CM | POA: Insufficient documentation

## 2016-01-28 DIAGNOSIS — Z7984 Long term (current) use of oral hypoglycemic drugs: Secondary | ICD-10-CM | POA: Diagnosis not present

## 2016-01-28 DIAGNOSIS — F1120 Opioid dependence, uncomplicated: Secondary | ICD-10-CM | POA: Diagnosis not present

## 2016-01-28 DIAGNOSIS — M797 Fibromyalgia: Secondary | ICD-10-CM | POA: Diagnosis not present

## 2016-01-28 DIAGNOSIS — F17210 Nicotine dependence, cigarettes, uncomplicated: Secondary | ICD-10-CM | POA: Diagnosis not present

## 2016-01-28 MED ORDER — OXYCODONE HCL 20 MG PO TABS: 1.0000 | ORAL_TABLET | Freq: Four times a day (QID) | ORAL | 0 refills | Status: DC

## 2016-01-28 NOTE — Progress Notes (Signed)
[name] Name: Audrey Garrett  MRN: [id number]  Referring Provider: Sherron Monday, MD  DOB: Jul 21, 1960  PCP: Luna Fuse, MD  DOS: 01/28/2016  Note by: Sydnee Levans. Laban Emperor, MD  Service setting: Ambulatory outpatient  Specialty: Interventional Pain Management  Location: [facility] (AMB) Pain Management Facility    Patient type: Established   Primary Reason(s) for Visit: Encounter for prescription drug management (Level of risk: moderate) CC: Back Pain (lower)  HPI  Audrey Garrett is a 55 y.o. year old, female patient, who comes today for an initial evaluation. She has Insomnia, persistent; BP (high blood pressure); Bilateral low back pain with left-sided sciatica; Cervical radicular pain; Uncomplicated opioid dependence (HCC); Long term prescription opiate use; Failed back surgical syndrome; Lumbar facet syndrome (Bilateral); Osteoarthritis of spine with radiculopathy, lumbar region; Pain of paraspinal muscle; Low back pain with radiation; HCV (hepatitis C virus); At risk for osteopenia; Lumbar spondylosis; Chronic lumbar radicular pain (Left); Chronic neck pain; Chronic lower extremity pain (Left); Chronic sacroiliac joint pain; Chronic upper back pain; Long term current use of opiate analgesic; Encounter for chronic pain management; Hypomagnesemia (low magnesium levels); Hypotension; Anemia; Thrombocytopenia (HCC); Coagulopathy (HCC); Hyperglycemia; Polyneuropathy (HCC); Urinary retention; Difficulty urinating; Nephrolithiasis; Alcoholic cirrhosis of liver with ascites (HCC); Depression, major, recurrent, moderate (HCC); Abdominal pain, chronic, epigastric; Hepatic cirrhosis (HCC); Acute GI bleeding; Neurogenic pain; S/P thoracolumbar fusion (T5-L5); Grade 1 Retrolisthesis (4 mm) of C5 over C6 and C6 over C7; Cervical foraminal stenosis (Left C5-6; Bilateral C6-7); Hernia of anterior abdominal wall; Incarcerated umbilical hernia; Controlled type 2 diabetes mellitus with hyperglycemia (HCC); Acute blood  loss anemia; Chronic pain syndrome; and Malnutrition of moderate degree on her problem list.. Her primarily concern today is the Back Pain (lower)  Pain Assessment: Self-Reported Pain Score: 4 /10             Reported level is compatible with observation.       Pain Type: Chronic pain Pain Location: Back Pain Orientation: Lower, Mid Pain Descriptors / Indicators: Aching, Dull, Radiating Pain Frequency: Constant  Audrey Garrett was last seen on 12/22/2015 for medication management. During today's appointment we reviewed Audrey Garrett's chronic pain status, as well as her outpatient medication regimen.  The patient  reports that she does not use drugs. Her body mass index is 20.67 kg/m.  Further details on both, my assessment(s), as well as the proposed treatment plan, please see below.  Controlled Substance Pharmacotherapy Assessment REMS (Risk Evaluation and Mitigation Strategy)  Analgesic:Oxycodone IR 20 mg every 6 (80 mg/day) MME/day:120 mg/day. Tyson Babinski, RN  01/28/2016  9:57 AM  Sign at close encounter Nursing Pain Medication Assessment:  Safety precautions to be maintained throughout the outpatient stay will include: orient to surroundings, keep bed in low position, maintain call bell within reach at all times, provide assistance with transfer out of bed and ambulation.  Medication Inspection Compliance: Audrey Garrett did not comply with our request to bring her pills to be counted. She was reminded that bringing the medication bottles, even when empty, is a requirement. Pill Count: No pills available to be counted today. Bottle Appearance: No container available. Did not bring bottle(s) to appointment. Medication: See above Filled Date: N/A Medication last intake: 01/28/2016 Pharmacokinetics: Liberation and absorption (onset of action): WNL Distribution (time to peak effect): WNL Metabolism and excretion (duration of action): WNL         Pharmacodynamics: Desired  effects: Analgesia: The patient reports >50% benefit. Reported improvement in function: The patient reports medication allows  her to accomplish basic ADLs. Clinically meaningful improvement in function (CMIF): Sustained CMIF goals met Perceived effectiveness: Described as relatively effective, allowing for increase in activities of daily living (ADL) Undesirable effects: Side-effects or Adverse reactions: None reported Monitoring: [place] PMP: Online review of the past 23-monthperiod conducted. Compliant with practice rules and regulations List of all UDS test(s) done:  Lab Results  Component Value Date   TOXASSSELUR FINAL 12/22/2015   TOXASSSELUR FINAL 10/01/2015   TOXASSSELUR FINAL 07/02/2015   [place] 04/03/2015   Last UDS on record: ToxAssure Select 13  Date Value Ref Range Status  12/22/2015 FINAL  Final    Comment:    ==================================================================== TOXASSURE SELECT 13 (MW) ==================================================================== Test                             Result       Flag       Units Drug Present and Declared for Prescription Verification   Oxycodone                      14280        EXPECTED   ng/mg creat   Oxymorphone                    1340         EXPECTED   ng/mg creat   Noroxycodone                   >40000       EXPECTED   ng/mg creat   Noroxymorphone                 836          EXPECTED   ng/mg creat    Sources of oxycodone are scheduled prescription medications.    Oxymorphone, noroxycodone, and noroxymorphone are expected    metabolites of oxycodone. Oxymorphone is also available as a    scheduled prescription medication. ==================================================================== Test                      Result    Flag   Units      Ref Range   Creatinine              25               mg/dL      >=20 ==================================================================== Declared  Medications:  The flagging and interpretation on this report are based on the  following declared medications.  Unexpected results may arise from  inaccuracies in the declared medications.  **Note: The testing scope of this panel includes these medications:  Oxycodone  Oxycodone (Percocet)  **Note: The testing scope of this panel does not include following  reported medications:  Acetaminophen (Percocet)  Calcium Carbonate (Calcium Carb)  Ciprofloxacin (Cipro)  Citalopram (Celexa)  Furosemide (Lasix)  Gabapentin  Iron (Ferrous Sulfate)  Lactulose  Ledipasvir  Magnesium Oxide  Metformin  Multivitamin  Pantoprazole (Protonix)  Paroxetine (Paxil)  Sofosbuvir  Spironolactone (Aldactone)  Sucralfate (Carafate)  Vitamin K ==================================================================== For clinical consultation, please call ([phone] ====================================================================    UDS interpretation: Compliant          Medication Assessment Form: Reviewed. Patient indicates being compliant with therapy Treatment compliance: Compliant Risk Assessment Profile: Aberrant behavior: See prior evaluations. None observed or detected today Comorbid factors increasing risk of overdose: See prior notes. No additional  risks detected today Risk of substance use disorder (SUD): High-to-Very High Opioid Risk Tool (ORT) Total Score: 7  Interpretation Table:  Score <3 = Low Risk for SUD  Score between 4-7 = Moderate Risk for SUD  Score >8 = High Risk for Opioid Abuse   Risk Mitigation Strategies:  Patient Counseling: Covered Patient-Prescriber Agreement (PPA): Present and active  Notification to other healthcare providers: Done  Pharmacologic Plan: No change in therapy, at this time  Laboratory Chemistry  Inflammation Markers Lab Results  Component Value Date   ESRSEDRATE 27 04/03/2015   CRP <0.5 04/03/2015   Renal Function Lab Results   Component Value Date   BUN 8 01/22/2016   CREATININE 0.54 01/22/2016   GFRAA >60 01/22/2016   GFRNONAA >60 01/22/2016   Hepatic Function Lab Results  Component Value Date   AST 23 01/21/2016   ALT 13 (L) 01/21/2016   ALBUMIN 2.9 (L) 01/21/2016   Electrolytes Lab Results  Component Value Date   NA 135 01/22/2016   K 4.2 01/22/2016   CL 106 01/22/2016   CALCIUM 8.2 (L) 01/22/2016   MG 1.9 01/22/2016   Pain Modulating Vitamins Lab Results  Component Value Date   VITAMINB12 686 04/03/2015   Coagulation Parameters Lab Results  Component Value Date   INR 1.32 01/22/2016   LABPROT 16.5 (H) 01/22/2016   APTT 34 01/20/2016   PLT 105 (L) 01/21/2016   Cardiovascular Lab Results  Component Value Date   BNP 929 (H) 09/23/2013   HGB 7.8 (L) 01/21/2016   HCT 23.2 (L) 01/21/2016   Note: Lab results reviewed.  Recent Diagnostic Imaging Review  No results found. Note: Imaging results reviewed.  Meds  The patient has a current medication list which includes the following prescription(s): ciprofloxacin, furosemide, gabapentin, lactulose, ledipasvir-sofosbuvir, magnesium oxide, metformin, multivitamin with minerals, omeprazole, ondansetron, one touch ultra test, oxycodone hcl, pantoprazole, paroxetine, phytonadione, spironolactone, and sucralfate.  Current Outpatient Prescriptions on File Prior to Visit  Medication Sig  . ciprofloxacin (CIPRO) 500 MG tablet Take 500 mg by mouth 2 (two) times a week. Monday and friday  . furosemide (LASIX) 20 MG tablet Take 20 mg by mouth 2 (two) times daily.   Marland Kitchen gabapentin (NEURONTIN) 300 MG capsule Take 300 mg by mouth 3 (three) times daily.   Marland Kitchen lactulose (CHRONULAC) 10 GM/15ML solution Take 10 g by mouth 3 (three) times daily.   . Ledipasvir-Sofosbuvir (HARVONI) 90-400 MG TABS Take 1 tablet by mouth daily.  . magnesium oxide (MAG-OX) 400 MG tablet Take 400 mg by mouth daily.  . metFORMIN (GLUCOPHAGE) 500 MG tablet Take 500 mg by mouth 2  (two) times daily with a meal.   . Multiple Vitamin (MULTIVITAMIN WITH MINERALS) TABS tablet Take 1 tablet by mouth daily.  Marland Kitchen omeprazole (PRILOSEC) 20 MG capsule Take 20 mg by mouth daily.  . pantoprazole (PROTONIX) 40 MG tablet Take 40 mg by mouth 2 (two) times daily.   . phytonadione (VITAMIN K) 5 MG tablet Take 5 mg by mouth 3 (three) times a week. Mon, Wed, Fri  . spironolactone (ALDACTONE) 100 MG tablet Take 100 mg by mouth 2 (two) times daily.   . sucralfate (CARAFATE) 1 GM/10ML suspension Take 1 g by mouth 3 (three) times daily as needed.    No current facility-administered medications on file prior to visit.    ROS  Constitutional: Denies any fever or chills Gastrointestinal: No reported hemesis, hematochezia, vomiting, or acute GI distress Musculoskeletal: Denies any acute onset joint swelling,  redness, loss of ROM, or weakness Neurological: No reported episodes of acute onset apraxia, aphasia, dysarthria, agnosia, amnesia, paralysis, loss of coordination, or loss of consciousness  Allergies  Audrey Garrett has No Known Allergies.  PFSH  Drug: Audrey Garrett  reports that she does not use drugs. Alcohol:  reports that she does not drink alcohol. Tobacco:  reports that she has been smoking Cigarettes.  She has a 20.00 pack-year smoking history. She has never used smokeless tobacco. Medical:  has a past medical history of Alcohol abuse; Alcoholic cirrhosis of liver without ascites ([place]); Anemia; Anxiety; Arthropathy; Back pain; Bronchitis; BRONCHITIS, ACUTE (11/01/2009); Chronic hepatitis C ([place]); Chronic LBP (12/25/2014); Cirrhosis ([place]); DDD (degenerative disc disease), lumbar (01/01/2015); Depression; Diabetes mellitus without complication ([place]); Edema; Fatigue; Fibromyalgia; High risk medications (not anticoagulants) long-term use; Hyperglycemia; Hypertension; Left leg pain (01/01/2015); Lumbago; Lumbar radicular pain (01/01/2015); Muscle spasm; Neck pain (01/01/2015); Polyarthritis; Reflux; RLS  (restless legs syndrome); Sacroiliac pain (01/01/2015); Shoulder pain; Sinusitis; SOB (shortness of breath); Spine disorder; Stress headaches; Upper back pain (01/01/2015); Urinary frequency; and Vitamin D deficiency disease. Family: family history includes [name] in her father; Breast cancer (age of onset: 60) in her sister; Heart disease in her mother; Stroke in her mother.  Past Surgical History:  Procedure Laterality Date  . BACK SURGERY  12/19/2011  . COLONOSCOPY WITH PROPOFOL N/A 09/25/2015   Procedure: COLONOSCOPY WITH PROPOFOL;  Surgeon: Lollie Sails, MD;  Location: [facility] ENDOSCOPY;  Service: Endoscopy;  Laterality: N/A;  . ESOPHAGOGASTRODUODENOSCOPY N/A 04/14/2015   Procedure: ESOPHAGOGASTRODUODENOSCOPY (EGD);  Surgeon: Lollie Sails, MD;  Location: [facility] ENDOSCOPY;  Service: Endoscopy;  Laterality: N/A;  . ESOPHAGOGASTRODUODENOSCOPY (EGD) WITH PROPOFOL N/A 09/16/2015   Procedure: ESOPHAGOGASTRODUODENOSCOPY (EGD) WITH PROPOFOL;  Surgeon: Lollie Sails, MD;  Location: [facility] ENDOSCOPY;  Service: Endoscopy;  Laterality: N/A;  . ESOPHAGOGASTRODUODENOSCOPY (EGD) WITH PROPOFOL N/A 01/22/2016   Procedure: ESOPHAGOGASTRODUODENOSCOPY (EGD) WITH PROPOFOL;  Surgeon: [name], MD;  Location: [facility];  Service: Endoscopy;  Laterality: N/A;  . HERNIA REPAIR N/A 10/2015   abdominal   . TONSILLECTOMY    . TUBAL LIGATION    . UMBILICAL HERNIA REPAIR N/A 11/06/2015   Procedure: HERNIA REPAIR UMBILICAL ADULT;  Surgeon: [name], MD;  Location: [facility] ORS;  Service: General;  Laterality: N/A;   Constitutional Exam  General appearance: Well nourished, well developed, and well hydrated. In no apparent acute distress Vitals:   01/28/16 0940  BP: (!) 107/51  Pulse: 86  Resp: 16  Temp: 98.5 F (36.9 C)  TempSrc: Oral  SpO2: 100%  Weight: 140 lb (63.5 kg)  Height: '5\' 9"'$  (1.753 m)   BMI Assessment: Estimated body mass index is 20.67 kg/m as calculated from the following:    Height as of this encounter: '5\' 9"'$  (1.753 m).   Weight as of this encounter: 140 lb (63.5 kg).  BMI interpretation table: BMI level Category Range association with higher incidence of chronic pain  <18 kg/m2 Underweight   18.5-24.9 kg/m2 Ideal body weight   25-29.9 kg/m2 Overweight Increased incidence by 20%  30-34.9 kg/m2 Obese (Class I) Increased incidence by 68%  35-39.9 kg/m2 Severe obesity (Class II) Increased incidence by 136%  >40 kg/m2 Extreme obesity (Class III) Increased incidence by 254%   BMI Readings from Last 4 Encounters:  01/28/16 20.67 kg/m  01/21/16 20.10 kg/m  01/20/16 20.08 kg/m  12/29/15 22.55 kg/m   Wt Readings from Last 4 Encounters:  01/28/16 140 lb (63.5 kg)  01/21/16 136  lb 1.6 oz (61.7 kg)  01/20/16 136 lb (61.7 kg)  12/29/15 144 lb (65.3 kg)  Psych/Mental status: Alert, oriented x 3 (person, place, & time) Eyes: PERLA Respiratory: No evidence of acute respiratory distress  Cervical Spine Exam  Inspection: No masses, redness, or swelling Alignment: Symmetrical Functional ROM: Unrestricted ROM Stability: No instability detected Muscle strength & Tone: Functionally intact Sensory: Unimpaired Palpation: Non-contributory  Upper Extremity (UE) Exam    Side: Right upper extremity  Side: Left upper extremity  Inspection: No masses, redness, swelling, or asymmetry  Inspection: No masses, redness, swelling, or asymmetry  Functional ROM: Unrestricted ROM         Functional ROM: Unrestricted ROM          Muscle strength & Tone: Functionally intact  Muscle strength & Tone: Functionally intact  Sensory: Unimpaired  Sensory: Unimpaired  Palpation: Non-contributory  Palpation: Non-contributory   Thoracic Spine Exam  Inspection: No masses, redness, or swelling Alignment: Symmetrical Functional ROM: Unrestricted ROM Stability: No instability detected Sensory: Unimpaired Muscle strength & Tone: Functionally intact Palpation: Non-contributory  Lumbar  Spine Exam  Inspection: No masses, redness, or swelling Alignment: Symmetrical Functional ROM: Unrestricted ROM Stability: No instability detected Muscle strength & Tone: Functionally intact Sensory: Unimpaired Palpation: Non-contributory Provocative Tests: Lumbar Hyperextension and rotation test: evaluation deferred today       Patrick's Maneuver: evaluation deferred today              Gait & Posture Assessment  Ambulation: Unassisted Gait: Relatively normal for age and body habitus Posture: WNL   Lower Extremity Exam    Side: Right lower extremity  Side: Left lower extremity  Inspection: No masses, redness, swelling, or asymmetry  Inspection: No masses, redness, swelling, or asymmetry  Functional ROM: Unrestricted ROM          Functional ROM: Unrestricted ROM          Muscle strength & Tone: Functionally intact  Muscle strength & Tone: Functionally intact  Sensory: Unimpaired  Sensory: Unimpaired  Palpation: Non-contributory  Palpation: Non-contributory   Assessment  Primary Diagnosis & Pertinent Problem List: The primary encounter diagnosis was Chronic pain syndrome. Diagnoses of Long term current use of opiate analgesic, Uncomplicated opioid dependence ([place]), and Grade 1 Retrolisthesis (4 mm) of C5 over C6 and C6 over C7 were also pertinent to this visit.  Visit Diagnosis: 1. Chronic pain syndrome   2. Long term current use of opiate analgesic   3. Uncomplicated opioid dependence ([place])   4. Grade 1 Retrolisthesis (4 mm) of C5 over C6 and C6 over C7    Plan of Care  Pharmacotherapy (Medications Ordered): Meds ordered this encounter  Medications  . Oxycodone HCl 20 MG TABS    Sig: Take 1 tablet (20 mg total) by mouth every 6 (six) hours.    Dispense:  120 tablet    Refill:  0    Do not add this medication to the electronic "Automatic Refill" notification system. Patient may have prescription filled one day early if pharmacy is closed on scheduled refill date. Do not fill  until: 01/28/16 To last until: 02/27/16   New Prescriptions   No medications on file   Medications administered during this visit: Audrey Garrett had no medications administered during this visit. Lab-work, Procedure(s), & Referral(s) Ordered: No orders of the defined types were placed in this encounter.  Imaging & Referral(s) Ordered: None  Interventional Therapies: Pending/Scheduled/Planned:   None at this time due to thrombocytopenia, alcoholic liver cirrhosis,  and increased risk of possible complications.    Considering:   None at this time.    PRN Procedures:   None at this time.    Requested PM Follow-up: Return in 4 weeks (on 02/26/2016) for Med-Mgmt.  No future appointments. Primary Care Physician: Volanda Napoleon, MD Location: [facility] Outpatient Pain Management Facility Note by: Kathlen Brunswick. Dossie Arbour, M.D, DABA, DABAPM, DABPM, DABIPP, FIPP  Pain Score Disclaimer: We use the NRS-11 scale. This is a self-reported, subjective measurement of pain severity with only modest accuracy. It is used primarily to identify changes within a particular patient. It must be understood that outpatient pain scales are significantly less accurate that those used for research, where they can be applied under ideal controlled circumstances with minimal exposure to variables. In reality, the score is likely to be a combination of pain intensity and pain affect, where pain affect describes the degree of emotional arousal or changes in action readiness caused by the sensory experience of pain. Factors such as social and work situation, setting, emotional state, anxiety levels, expectation, and prior pain experience may influence pain perception and show large inter-individual differences that may also be affected by time variables.  Patient instructions provided during this appointment: Patient Instructions  Pain Management Discharge Instructions  General Discharge Instructions :  If you need  to reach your doctor call: Monday-Friday 8:00 am - 4:00 pm at [phone] or toll free [phone].  After clinic hours [phone] to have operator reach doctor.  Bring all of your medication bottles to all your appointments in the pain clinic.  To cancel or reschedule your appointment with Pain Management please remember to call 24 hours in advance to avoid a fee.  Refer to the educational materials which you have been given on: General Risks, I had my Procedure. Discharge Instructions, Post Sedation.  Post Procedure Instructions:  The drugs you were given will stay in your system until tomorrow, so for the next 24 hours you should not drive, make any legal decisions or drink any alcoholic beverages.  You may eat anything you prefer, but it is better to start with liquids then soups and crackers, and gradually work up to solid foods.  Please notify your doctor immediately if you have any unusual bleeding, trouble breathing or pain that is not related to your normal pain.  Depending on the type of procedure that was done, some parts of your body may feel week and/or numb.  This usually clears up by tonight or the next day.  Walk with the use of an assistive device or accompanied by an adult for the 24 hours.  You may use ice on the affected area for the first 24 hours.  Put ice in a Ziploc bag and cover with a towel and place against area 15 minutes on 15 minutes off.  You may switch to heat after 24 hours.

## 2016-01-28 NOTE — Patient Instructions (Signed)

## 2016-01-28 NOTE — Progress Notes (Signed)
Nursing Pain Medication Assessment:  Safety precautions to be maintained throughout the outpatient stay will include: orient to surroundings, keep bed in low position, maintain call bell within reach at all times, provide assistance with transfer out of bed and ambulation.  Medication Inspection Compliance: Audrey Garrett did not comply with our request to bring her pills to be counted. She was reminded that bringing the medication bottles, even when empty, is a requirement. Pill Count: No pills available to be counted today. Bottle Appearance: No container available. Did not bring bottle(s) to appointment. Medication: See above Filled Date: N/A Medication last intake: 01/28/2016

## 2016-02-11 ENCOUNTER — Inpatient Hospital Stay: Payer: Medicare Other

## 2016-02-11 ENCOUNTER — Inpatient Hospital Stay: Payer: Medicare Other | Attending: Internal Medicine | Admitting: Internal Medicine

## 2016-02-11 VITALS — BP 102/68 | HR 89 | Temp 97.2°F | Wt 145.3 lb

## 2016-02-11 DIAGNOSIS — M5412 Radiculopathy, cervical region: Secondary | ICD-10-CM | POA: Insufficient documentation

## 2016-02-11 DIAGNOSIS — B182 Chronic viral hepatitis C: Secondary | ICD-10-CM | POA: Diagnosis not present

## 2016-02-11 DIAGNOSIS — F17210 Nicotine dependence, cigarettes, uncomplicated: Secondary | ICD-10-CM | POA: Insufficient documentation

## 2016-02-11 DIAGNOSIS — D689 Coagulation defect, unspecified: Secondary | ICD-10-CM | POA: Diagnosis not present

## 2016-02-11 DIAGNOSIS — M5116 Intervertebral disc disorders with radiculopathy, lumbar region: Secondary | ICD-10-CM | POA: Diagnosis not present

## 2016-02-11 DIAGNOSIS — Z7984 Long term (current) use of oral hypoglycemic drugs: Secondary | ICD-10-CM | POA: Diagnosis not present

## 2016-02-11 DIAGNOSIS — I10 Essential (primary) hypertension: Secondary | ICD-10-CM | POA: Insufficient documentation

## 2016-02-11 DIAGNOSIS — Z79899 Other long term (current) drug therapy: Secondary | ICD-10-CM | POA: Insufficient documentation

## 2016-02-11 DIAGNOSIS — F329 Major depressive disorder, single episode, unspecified: Secondary | ICD-10-CM | POA: Diagnosis not present

## 2016-02-11 DIAGNOSIS — K7460 Unspecified cirrhosis of liver: Secondary | ICD-10-CM | POA: Diagnosis not present

## 2016-02-11 DIAGNOSIS — D500 Iron deficiency anemia secondary to blood loss (chronic): Secondary | ICD-10-CM | POA: Insufficient documentation

## 2016-02-11 DIAGNOSIS — G2581 Restless legs syndrome: Secondary | ICD-10-CM | POA: Insufficient documentation

## 2016-02-11 DIAGNOSIS — F419 Anxiety disorder, unspecified: Secondary | ICD-10-CM | POA: Insufficient documentation

## 2016-02-11 DIAGNOSIS — F1020 Alcohol dependence, uncomplicated: Secondary | ICD-10-CM

## 2016-02-11 DIAGNOSIS — E559 Vitamin D deficiency, unspecified: Secondary | ICD-10-CM | POA: Diagnosis not present

## 2016-02-11 DIAGNOSIS — K219 Gastro-esophageal reflux disease without esophagitis: Secondary | ICD-10-CM | POA: Diagnosis not present

## 2016-02-11 DIAGNOSIS — M797 Fibromyalgia: Secondary | ICD-10-CM | POA: Insufficient documentation

## 2016-02-11 DIAGNOSIS — E119 Type 2 diabetes mellitus without complications: Secondary | ICD-10-CM | POA: Diagnosis not present

## 2016-02-11 LAB — FERRITIN: Ferritin: 6 ng/mL — ABNORMAL LOW (ref 11–307)

## 2016-02-11 LAB — IRON AND TIBC
Iron: 19 ug/dL — ABNORMAL LOW (ref 28–170)
Saturation Ratios: 3 % — ABNORMAL LOW (ref 10.4–31.8)
TIBC: 560 ug/dL — ABNORMAL HIGH (ref 250–450)
UIBC: 541 ug/dL

## 2016-02-11 MED ORDER — VITAMIN B-1 100 MG PO TABS: 100.0000 mg | ORAL_TABLET | Freq: Every day | ORAL | 6 refills | Status: DC

## 2016-02-11 NOTE — Progress Notes (Signed)
[place]  [phone]) [phone] [phone]) [phone]  ID: Audrey Garrett OB: 27-Dec-1960  MR#: [id number]  XEN#:[id number]  Patient Care Team: Jodi Marble, MD as PCP - General (Internal Medicine)  CHIEF COMPLAINT: Anemia; Fatigue; and New Patient (Initial Visit) Consultation requested for chronic anemia.  History of present illness: This is a 55 year old lady who has a history of chronic hepatitis C and cirrhosis of the liver.  She was admitted to [facility] in [facility] for UGI bleed in 12/2015 .At that time was 5 she was transfused 2 units blood and was given Protonix.   She had an EGD during that visit EGD didn't reveal an any source of bleeding  A rectal exam while in the endoscopy suite revealed maroon blood.  The patient was informed by the gastroenterologist that he was concerned she may be experiencing a right-sided colon GI bleed and that her previous colonoscopies had not provided sufficient enough information to determine if this had been previously evaluated or not.  She was sent back to the step down unit with the understanding that the medical team wish to perform a colonoscopy on 01/23/2016.  The patient reported to the nurse that she did not wish to have a colonoscopy and that she was leaving the hospital [place].  She signed the AMA form and then left. ON 01/21/2016  She is currently on Harvoni for chronic Hepattis C  She also takes vitamin K for liver disease related coagulopathy.  She has a history of cervical radicular pain and opioid dependence and is followed by pain management.   Review of her blood work from outside shows hemoglobin at 7.1 on 02/04/2016 8.2 on 1027 and 11.7 on 927. MCV has been normal. Platelet count fluctuates between 112 276. White blood cell count is between 5-7.  REVIEW OF SYSTEMS:   Review of Systems  Constitutional: Positive for malaise/fatigue. Negative for chills, diaphoresis, fever and  weight loss.  HENT: Negative.   Eyes: Negative.   Respiratory: Negative.  Negative for cough, hemoptysis, sputum production, shortness of breath and wheezing.   Cardiovascular: Negative.   Gastrointestinal: Positive for blood in stool and diarrhea. Negative for melena.       Craving for ice  Genitourinary: Negative.   Musculoskeletal: Positive for back pain, joint pain, myalgias and neck pain.       Unsteady gait Restless legs at night that wake her up from sleep  Skin: Negative.   Neurological: Positive for dizziness, weakness and headaches. Negative for tingling, tremors, sensory change, speech change, focal weakness, seizures and loss of consciousness.  Endo/Heme/Allergies: Negative.  Does not bruise/bleed easily.  Psychiatric/Behavioral: The patient is not nervous/anxious and does not have insomnia.     As per HPI. Otherwise, a complete review of systems is negative.  PAST MEDICAL HISTORY: Past Medical History:  Diagnosis Date  . Alcohol abuse   . Alcoholic cirrhosis of liver without ascites ([place])   . Anemia   . Anxiety   . Arthropathy   . Back pain   . Bronchitis   . BRONCHITIS, ACUTE 11/01/2009   Qualifier: Diagnosis of  By: Alveta Heimlich MD, Cornelia Copa    . Chronic hepatitis C ([place])   . Chronic LBP 12/25/2014   Overview:  Post extensive surgery   . Cirrhosis ([place])   . DDD (degenerative disc disease), lumbar 01/01/2015  . Depression   . Diabetes mellitus without complication ([place])   . Edema   . Fatigue   . Fibromyalgia   .  High risk medications (not anticoagulants) long-term use   . Hyperglycemia   . Hypertension   . Left leg pain 01/01/2015  . Lumbago   . Lumbar radicular pain 01/01/2015  . Muscle spasm   . Neck pain 01/01/2015  . Polyarthritis   . Reflux   . RLS (restless legs syndrome)   . Sacroiliac pain 01/01/2015  . Shoulder pain   . Sinusitis   . SOB (shortness of breath)   . Spine disorder   . Stress headaches   . Upper back pain 01/01/2015  . Urinary frequency   .  Vitamin D deficiency disease     PAST SURGICAL HISTORY: Past Surgical History:  Procedure Laterality Date  . BACK SURGERY  12/19/2011  . COLONOSCOPY WITH PROPOFOL N/A 09/25/2015   Procedure: COLONOSCOPY WITH PROPOFOL;  Surgeon: Lollie Sails, MD;  Location: [facility] ENDOSCOPY;  Service: Endoscopy;  Laterality: N/A;  . ESOPHAGOGASTRODUODENOSCOPY N/A 04/14/2015   Procedure: ESOPHAGOGASTRODUODENOSCOPY (EGD);  Surgeon: Lollie Sails, MD;  Location: [facility] ENDOSCOPY;  Service: Endoscopy;  Laterality: N/A;  . ESOPHAGOGASTRODUODENOSCOPY (EGD) WITH PROPOFOL N/A 09/16/2015   Procedure: ESOPHAGOGASTRODUODENOSCOPY (EGD) WITH PROPOFOL;  Surgeon: Lollie Sails, MD;  Location: [facility] ENDOSCOPY;  Service: Endoscopy;  Laterality: N/A;  . ESOPHAGOGASTRODUODENOSCOPY (EGD) WITH PROPOFOL N/A 01/22/2016   Procedure: ESOPHAGOGASTRODUODENOSCOPY (EGD) WITH PROPOFOL;  Surgeon: [name], MD;  Location: [place];  Service: Endoscopy;  Laterality: N/A;  . HERNIA REPAIR N/A 10/2015   abdominal   . TONSILLECTOMY    . TUBAL LIGATION    . UMBILICAL HERNIA REPAIR N/A 11/06/2015   Procedure: HERNIA REPAIR UMBILICAL ADULT;  Surgeon: [name], MD;  Location: [facility] ORS;  Service: General;  Laterality: N/A;    FAMILY HISTORY: Family History  Problem Relation Age of Onset  . Stroke Mother   . Heart disease Mother   . [name] Father   . Breast cancer Sister 58  . Kidney disease Neg Hx   . Bladder Cancer Neg Hx     ADVANCED DIRECTIVES (Y/N):  N  HEALTH MAINTENANCE: Social History  Substance Use Topics  . Smoking status: Current Every Day Smoker    Packs/day: 0.50    Years: 40.00    Types: Cigarettes  . Smokeless tobacco: Never Used  . Alcohol use No     Comment: stopped drinking 05/12/15     Colonoscopy:  PAP:  Bone density:  Lipid panel:  No Known Allergies  Current Outpatient Prescriptions  Medication Sig Dispense Refill  . ciprofloxacin (CIPRO) 500 MG tablet Take 500 mg by mouth  2 (two) times a week. Monday and friday    . furosemide (LASIX) 20 MG tablet Take 20 mg by mouth 2 (two) times daily.   11  . gabapentin (NEURONTIN) 400 MG capsule Take 400 mg by mouth 3 (three) times daily.   2  . lactulose (CHRONULAC) 10 GM/15ML solution Take 10 g by mouth 3 (three) times daily.     . Ledipasvir-Sofosbuvir (HARVONI) 90-400 MG TABS Take 1 tablet by mouth daily.    . Magnesium Oxide 250 MG TABS Take 1 tablet by mouth daily.    . metFORMIN (GLUCOPHAGE) 500 MG tablet Take 500 mg by mouth 2 (two) times daily with a meal.     . Multiple Vitamin (MULTIVITAMIN WITH MINERALS) TABS tablet Take 1 tablet by mouth daily.    Marland Kitchen omeprazole (PRILOSEC) 20 MG capsule Take 20 mg by mouth daily.    . Oxycodone HCl 20 MG TABS  Take 1 tablet (20 mg total) by mouth every 6 (six) hours. 120 tablet 0  . PARoxetine (PAXIL) 40 MG tablet Take 40 mg by mouth every morning.  2  . phytonadione (VITAMIN K) 5 MG tablet Take 5 mg by mouth 3 (three) times a week. Mon, Wed, Fri    . rOPINIRole (REQUIP) 0.25 MG tablet Take 0.25 mg by mouth at bedtime.    Marland Kitchen spironolactone (ALDACTONE) 100 MG tablet Take 100 mg by mouth 2 (two) times daily.   1  . sucralfate (CARAFATE) 1 GM/10ML suspension Take 1 g by mouth 3 (three) times daily as needed.     . thiamine (VITAMIN B-1) 100 MG tablet Take 1 tablet (100 mg total) by mouth daily. 90 tablet 6   No current facility-administered medications for this visit.     OBJECTIVE: Vitals:   02/11/16 0930  BP: 102/68  Pulse: 89  Temp: 97.2 F (36.2 C)     Body mass index is 21.45 kg/m.   1.79 meters squared  ECOG FS:2 - Symptomatic, <50% confined to bed  Physical Exam  Constitutional: She appears distressed.  Thin built, in built, in slight distress from feelign tired  HENT:  Head: Normocephalic and atraumatic.  Eyes: EOM are normal. Pupils are equal, round, and reactive to light.  Neck: Normal range of motion. Neck supple. No JVD present. No tracheal deviation present.   Cardiovascular: Normal rate and regular rhythm.   Pulmonary/Chest: Effort normal and breath sounds normal. No stridor.  Abdominal: Soft. She exhibits mass (live palpable 4 cm below th eRight coastal margin, firm non tender). She exhibits no distension. There is no tenderness.  Musculoskeletal: She exhibits no edema or tenderness.  Old well healed scar over the entir elength of her spine form repai of scolioss  Lymphadenopathy:    She has no cervical adenopathy.  Neurological: No cranial nerve deficit or sensory deficit. Coordination abnormal.  Skin: Skin is warm. She is not diaphoretic.  Psychiatric: She has a normal mood and affect. Her behavior is normal. Judgment and thought content normal.  Nursing note and vitals reviewed.   LAB RESULTS:  Lab Results  Component Value Date   NA 135 01/22/2016   K 4.2 01/22/2016   CL 106 01/22/2016   CO2 23 01/22/2016   GLUCOSE 106 (H) 01/22/2016   BUN 8 01/22/2016   CREATININE 0.54 01/22/2016   CALCIUM 8.2 (L) 01/22/2016   PROT 5.7 (L) 01/21/2016   ALBUMIN 2.9 (L) 01/21/2016   AST 23 01/21/2016   ALT 13 (L) 01/21/2016   ALKPHOS 43 01/21/2016   BILITOT 0.9 01/21/2016   GFRNONAA >60 01/22/2016   GFRAA >60 01/22/2016    Lab Results  Component Value Date   WBC 7.2 01/21/2016   NEUTROABS 9.1 (H) 01/20/2016   HGB 7.8 (L) 01/21/2016   HCT 23.2 (L) 01/21/2016   MCV 85.2 01/21/2016   PLT 105 (L) 01/21/2016  . 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  Lab today- iron panel, ferritin, cbc b12,  Lab MD in 8 weeks to assess for improvement.   Orders Placed This Encounter  Procedures  . Ferritin    Standing Status:   Future    Number of Occurrences:   1    Standing Expiration Date:   02/10/2017  . Iron and TIBC    Standing Status:   Future    Number of Occurrences:   1    Standing Expiration Date:   02/10/2017  . Methylmalonic acid, serum    Standing Status:   Future    Number of Occurrences:   1    Standing Expiration Date:   02/10/2017    Patient expressed understanding and was in agreement with this plan. She also understands that She can call clinic at any time with any questions, concerns, or complaints.  ------------------------------------------------------------ This note was generated in part with voice recognition software and I apologize for any typographical errors that were not detected and corrected.    Creola Corn, MD   02/11/2016 12:08 PM

## 2016-02-13 ENCOUNTER — Other Ambulatory Visit (HOSPITAL_COMMUNITY): Payer: Self-pay | Admitting: Family Medicine

## 2016-02-13 ENCOUNTER — Other Ambulatory Visit: Payer: Self-pay | Admitting: Gastroenterology

## 2016-02-13 ENCOUNTER — Ambulatory Visit
Admission: RE | Admit: 2016-02-13 | Discharge: 2016-02-13 | Disposition: A | Discharge status: Home / Self Care | Payer: Medicare Other | Source: Ambulatory Visit | Attending: Gastroenterology | Admitting: Gastroenterology

## 2016-02-13 DIAGNOSIS — D500 Iron deficiency anemia secondary to blood loss (chronic): Secondary | ICD-10-CM | POA: Insufficient documentation

## 2016-02-13 DIAGNOSIS — D509 Iron deficiency anemia, unspecified: Secondary | ICD-10-CM

## 2016-02-13 DIAGNOSIS — K922 Gastrointestinal hemorrhage, unspecified: Secondary | ICD-10-CM | POA: Insufficient documentation

## 2016-02-13 DIAGNOSIS — R58 Hemorrhage, not elsewhere classified: Secondary | ICD-10-CM

## 2016-02-13 DIAGNOSIS — K7030 Alcoholic cirrhosis of liver without ascites: Secondary | ICD-10-CM | POA: Diagnosis not present

## 2016-02-13 LAB — GLUCOSE, CAPILLARY: Glucose-Capillary: 169 mg/dL — ABNORMAL HIGH (ref 65–99)

## 2016-02-13 LAB — PREPARE RBC (CROSSMATCH)

## 2016-02-13 MED ORDER — SODIUM CHLORIDE 0.9 % IV SOLN: INTRAVENOUS | Status: DC

## 2016-02-14 LAB — TYPE AND SCREEN
ABO/RH(D): O POS
Antibody Screen: NEGATIVE
Unit division: 0
Unit division: 0

## 2016-02-14 LAB — VITAMIN B12: Vitamin B-12: 591 pg/mL (ref 180–914)

## 2016-02-16 LAB — FOLATE RBC
Folate, Hemolysate: 452.5 ng/mL
Folate, RBC: 2011 ng/mL (ref >498)
Hematocrit: 22.5 % — ABNORMAL LOW (ref 34.0–46.6)

## 2016-02-16 LAB — METHYLMALONIC ACID, SERUM: Methylmalonic Acid, Quantitative: 205 nmol/L (ref 0–378)

## 2016-02-17 ENCOUNTER — Ambulatory Visit
Admission: RE | Admit: 2016-02-17 | Discharge: 2016-02-17 | Disposition: A | Discharge status: Home / Self Care | Payer: Medicare Other | Source: Ambulatory Visit | Attending: Gastroenterology | Admitting: Gastroenterology

## 2016-02-17 DIAGNOSIS — D509 Iron deficiency anemia, unspecified: Secondary | ICD-10-CM | POA: Diagnosis not present

## 2016-02-17 DIAGNOSIS — R58 Hemorrhage, not elsewhere classified: Secondary | ICD-10-CM | POA: Insufficient documentation

## 2016-02-17 DIAGNOSIS — R188 Other ascites: Secondary | ICD-10-CM | POA: Insufficient documentation

## 2016-02-17 DIAGNOSIS — K8020 Calculus of gallbladder without cholecystitis without obstruction: Secondary | ICD-10-CM | POA: Diagnosis not present

## 2016-02-17 DIAGNOSIS — K766 Portal hypertension: Secondary | ICD-10-CM | POA: Insufficient documentation

## 2016-02-17 MED ORDER — IOPAMIDOL (ISOVUE-300) INJECTION 61%
100.0000 mL | Freq: Once | INTRAVENOUS | Status: AC | PRN
  Administered 2016-02-17: 100 mL via INTRAVENOUS

## 2016-02-18 ENCOUNTER — Inpatient Hospital Stay: Payer: Medicare Other

## 2016-02-18 VITALS — BP 110/70 | HR 94 | Temp 98.0°F | Resp 18

## 2016-02-18 DIAGNOSIS — D500 Iron deficiency anemia secondary to blood loss (chronic): Secondary | ICD-10-CM | POA: Diagnosis not present

## 2016-02-18 MED ORDER — IRON SUCROSE 20 MG/ML IV SOLN
200.0000 mg | Freq: Once | INTRAVENOUS | Status: AC
  Administered 2016-02-18: 200 mg via INTRAVENOUS
  Filled 2016-02-18: qty 10

## 2016-02-18 MED ORDER — SODIUM CHLORIDE 0.9 % IV SOLN
Freq: Once | INTRAVENOUS | Status: AC
  Administered 2016-02-18: 14:00:00 via INTRAVENOUS
  Filled 2016-02-18: qty 1000

## 2016-02-18 MED ORDER — SODIUM CHLORIDE 0.9 % IV SOLN: 200.0000 mg | Freq: Once | INTRAVENOUS | Status: DC

## 2016-02-24 ENCOUNTER — Ambulatory Visit: Payer: Medicare Other | Attending: Pain Medicine | Admitting: Pain Medicine

## 2016-02-24 ENCOUNTER — Encounter: Payer: Self-pay | Admitting: Pain Medicine

## 2016-02-24 VITALS — BP 107/58 | HR 82 | Temp 98.7°F | Resp 16 | Ht 70.0 in | Wt 145.0 lb

## 2016-02-24 DIAGNOSIS — K420 Umbilical hernia with obstruction, without gangrene: Secondary | ICD-10-CM | POA: Insufficient documentation

## 2016-02-24 DIAGNOSIS — M5442 Lumbago with sciatica, left side: Secondary | ICD-10-CM

## 2016-02-24 DIAGNOSIS — K7031 Alcoholic cirrhosis of liver with ascites: Secondary | ICD-10-CM | POA: Diagnosis not present

## 2016-02-24 DIAGNOSIS — K439 Ventral hernia without obstruction or gangrene: Secondary | ICD-10-CM | POA: Diagnosis not present

## 2016-02-24 DIAGNOSIS — F1190 Opioid use, unspecified, uncomplicated: Secondary | ICD-10-CM | POA: Diagnosis not present

## 2016-02-24 DIAGNOSIS — M4802 Spinal stenosis, cervical region: Secondary | ICD-10-CM | POA: Insufficient documentation

## 2016-02-24 DIAGNOSIS — M5416 Radiculopathy, lumbar region: Secondary | ICD-10-CM

## 2016-02-24 DIAGNOSIS — M479 Spondylosis, unspecified: Secondary | ICD-10-CM | POA: Diagnosis not present

## 2016-02-24 DIAGNOSIS — G2581 Restless legs syndrome: Secondary | ICD-10-CM | POA: Insufficient documentation

## 2016-02-24 DIAGNOSIS — G4700 Insomnia, unspecified: Secondary | ICD-10-CM | POA: Diagnosis not present

## 2016-02-24 DIAGNOSIS — Z7984 Long term (current) use of oral hypoglycemic drugs: Secondary | ICD-10-CM | POA: Diagnosis not present

## 2016-02-24 DIAGNOSIS — K766 Portal hypertension: Secondary | ICD-10-CM | POA: Insufficient documentation

## 2016-02-24 DIAGNOSIS — M545 Low back pain: Secondary | ICD-10-CM | POA: Insufficient documentation

## 2016-02-24 DIAGNOSIS — M5116 Intervertebral disc disorders with radiculopathy, lumbar region: Secondary | ICD-10-CM | POA: Diagnosis not present

## 2016-02-24 DIAGNOSIS — M5412 Radiculopathy, cervical region: Secondary | ICD-10-CM | POA: Diagnosis not present

## 2016-02-24 DIAGNOSIS — G8929 Other chronic pain: Secondary | ICD-10-CM

## 2016-02-24 DIAGNOSIS — D509 Iron deficiency anemia, unspecified: Secondary | ICD-10-CM | POA: Diagnosis not present

## 2016-02-24 DIAGNOSIS — M961 Postlaminectomy syndrome, not elsewhere classified: Secondary | ICD-10-CM

## 2016-02-24 DIAGNOSIS — I959 Hypotension, unspecified: Secondary | ICD-10-CM | POA: Diagnosis not present

## 2016-02-24 DIAGNOSIS — E1165 Type 2 diabetes mellitus with hyperglycemia: Secondary | ICD-10-CM | POA: Insufficient documentation

## 2016-02-24 DIAGNOSIS — G894 Chronic pain syndrome: Secondary | ICD-10-CM | POA: Insufficient documentation

## 2016-02-24 DIAGNOSIS — Z79891 Long term (current) use of opiate analgesic: Secondary | ICD-10-CM | POA: Diagnosis not present

## 2016-02-24 DIAGNOSIS — D62 Acute posthemorrhagic anemia: Secondary | ICD-10-CM | POA: Diagnosis not present

## 2016-02-24 MED ORDER — OXYCODONE HCL 20 MG PO TABS: 1.0000 | ORAL_TABLET | Freq: Four times a day (QID) | ORAL | 0 refills | Status: DC | PRN

## 2016-02-24 MED ORDER — OXYCODONE HCL 20 MG PO TABS: 20.0000 mg | ORAL_TABLET | Freq: Four times a day (QID) | ORAL | 0 refills | Status: DC | PRN

## 2016-02-24 MED ORDER — OXYCODONE HCL 20 MG PO TABS: 1.0000 | ORAL_TABLET | Freq: Four times a day (QID) | ORAL | 0 refills | Status: DC

## 2016-02-24 NOTE — Progress Notes (Signed)
Safety precautions to be maintained throughout the outpatient stay will include: orient to surroundings, keep bed in low position, maintain call bell within reach at all times, provide assistance with transfer out of bed and ambulation.  

## 2016-02-24 NOTE — Progress Notes (Signed)
Nursing Pain Medication Assessment:  Safety precautions to be maintained throughout the outpatient stay will include: orient to surroundings, keep bed in low position, maintain call bell within reach at all times, provide assistance with transfer out of bed and ambulation.  Medication Inspection Compliance: Pill count conducted under aseptic conditions, in front of the patient. Neither the pills nor the bottle was removed from the patient's sight at any time. Once count was completed pills were immediately returned to the patient in their original bottle. Pill Count: 95 of 120 pills remain Bottle Appearance: Standard pharmacy container. Clearly labeled. Medication: See above Filled Date: 64 / 23 / 2017

## 2016-02-24 NOTE — Progress Notes (Signed)
[name] Name: Audrey Garrett  MRN: [id number]  Referring Provider: Jodi Marble, MD  DOB: 08-10-1960  PCP: Jodi Marble, MD  DOS: 01/29/2016  Note by: Kathlen Brunswick. Dossie Arbour, MD  Service setting: Ambulatory outpatient  Specialty: Interventional Pain Management  Location: [facility] (AMB) Pain Management Facility    Patient type: Established   Primary Reason(s) for Visit: Encounter for prescription drug management (Level of risk: moderate) CC: Back Pain (lower)  HPI  Audrey Garrett is a 55 y.o. year old, female patient, who comes today for a medication management evaluation. She has Insomnia, persistent; BP (high blood pressure); Cervical radicular pain; Uncomplicated opioid dependence ([place]); Long term prescription opiate use; Failed back surgical syndrome; Lumbar facet syndrome (Bilateral); Osteoarthritis of spine with radiculopathy, lumbar region; Pain of paraspinal muscle; Chronic low back pain (Location of Primary Source of Pain) (Bilateral) (L>R); HCV (hepatitis C virus); At risk for osteopenia; Lumbar spondylosis; Chronic lumbar radicular pain (Location of Secondary source of pain) (Left); Chronic neck pain; Chronic lower extremity pain (Left); Chronic sacroiliac joint pain; Chronic upper back pain; Long term current use of opiate analgesic; Encounter for chronic pain management; Hypomagnesemia (low magnesium levels); Hypotension; Anemia; Thrombocytopenia ([place]); Coagulopathy ([place]); Hyperglycemia; Polyneuropathy ([place]); Urinary retention; Difficulty urinating; Nephrolithiasis; Alcoholic cirrhosis of liver with ascites ([place]); Depression, major, recurrent, moderate ([place]); Abdominal pain, chronic, epigastric; Hepatic cirrhosis ([place]); Acute GI bleeding; Neurogenic pain; S/P thoracolumbar fusion (T5-L5); Grade 1 Retrolisthesis (4 mm) of C5 over C6 and C6 over C7; Cervical foraminal stenosis (Left C5-6; Bilateral C6-7); Hernia of anterior abdominal wall; Incarcerated umbilical hernia; Controlled type 2 diabetes  mellitus with hyperglycemia ([place]); Acute blood loss anemia; Chronic pain syndrome; Malnutrition of moderate degree; Iron deficiency anemia due to chronic blood loss; Opiate use; and RLS (restless legs syndrome) on her problem list. Her primarily concern today is the Back Pain (lower)  Pain Assessment: Self-Reported Pain Score: 7 /10 Clinically the patient looks like a 3/10 Reported level is inconsistent with clinical observations. Information on the proper use of the pain score provided to the patient today. Pain Type: Chronic pain Pain Location: Back Pain Orientation: Lower, Left Pain Frequency: Constant  Ms. Tash was last seen on 01/28/2016 for medication management. During today's appointment we reviewed Audrey Garrett's chronic pain status, as well as her outpatient medication regimen.  The patient  reports that she does not use drugs. Her body mass index is 20.81 kg/m.  Further details on both, my assessment(s), as well as the proposed treatment plan, please see below.  Controlled Substance Pharmacotherapy Assessment REMS (Risk Evaluation and Mitigation Strategy)  Analgesic:Oxycodone IR 20 mg every 6 (80 mg/day) MME/day:120 mg/day. Ignatius Specking, RN  02/15/2016 11:17 AM  Sign at close encounter Nursing Pain Medication Assessment:  Safety precautions to be maintained throughout the outpatient stay will include: orient to surroundings, keep bed in low position, maintain call bell within reach at all times, provide assistance with transfer out of bed and ambulation.  Medication Inspection Compliance: Pill count conducted under aseptic conditions, in front of the patient. Neither the pills nor the bottle was removed from the patient's sight at any time. Once count was completed pills were immediately returned to the patient in their original bottle. Pill Count: 95 of 120 pills remain Bottle Appearance: Standard pharmacy container. Clearly labeled. Medication: See above Filled Date: 74  / 23 / 2017  Ignatius Specking, RN  02/26/2016 11:13 AM  Sign at close encounter Safety precautions to be maintained throughout the outpatient stay will include:  orient to surroundings, keep bed in low position, maintain call bell within reach at all times, provide assistance with transfer out of bed and ambulation.    Pharmacokinetics: Liberation and absorption (onset of action): WNL Distribution (time to peak effect): WNL Metabolism and excretion (duration of action): WNL         Pharmacodynamics: Desired effects: Analgesia: Audrey Garrett reports >50% benefit. Functional ability: Patient reports that medication allows her to accomplish basic ADLs Clinically meaningful improvement in function (CMIF): Sustained CMIF goals met Perceived effectiveness: Described as relatively effective, allowing for increase in activities of daily living (ADL) Undesirable effects: Side-effects or Adverse reactions: None reported Monitoring: [place] PMP: Online review of the past 21-monthperiod conducted. Compliant with practice rules and regulations List of all UDS test(s) done:  Lab Results  Component Value Date   TOXASSSELUR FINAL 12/22/2015   TOXASSSELUR FINAL 10/01/2015   TOXASSSELUR FINAL 07/02/2015   [place] 04/03/2015   Last UDS on record: [employer] Select 13  Date Value Ref Range Status  12/22/2015 FINAL  Final    Comment:    ==================================================================== TOXASSURE SELECT 13 (MW) ==================================================================== Test                             Result       Flag       Units Drug Present and Declared for Prescription Verification   Oxycodone                      14280        EXPECTED   ng/mg creat   Oxymorphone                    1340         EXPECTED   ng/mg creat   Noroxycodone                   >40000       EXPECTED   ng/mg creat   Noroxymorphone                 836          EXPECTED   ng/mg creat    Sources  of oxycodone are scheduled prescription medications.    Oxymorphone, noroxycodone, and noroxymorphone are expected    metabolites of oxycodone. Oxymorphone is also available as a    scheduled prescription medication. ==================================================================== Test                      Result    Flag   Units      Ref Range   Creatinine              25               mg/dL      >=20 ==================================================================== Declared Medications:  The flagging and interpretation on this report are based on the  following declared medications.  Unexpected results may arise from  inaccuracies in the declared medications.  **Note: The testing scope of this panel includes these medications:  Oxycodone  Oxycodone (Percocet)  **Note: The testing scope of this panel does not include following  reported medications:  Acetaminophen (Percocet)  Calcium Carbonate (Calcium Carb)  Ciprofloxacin (Cipro)  Citalopram (Celexa)  Furosemide (Lasix)  Gabapentin  Iron (Ferrous Sulfate)  Lactulose  Ledipasvir  Magnesium Oxide  Metformin  Multivitamin  Pantoprazole (Protonix)  Paroxetine (  Paxil)  Sofosbuvir  Spironolactone (Aldactone)  Sucralfate (Carafate)  Vitamin K ==================================================================== For clinical consultation, please call [phone]. ====================================================================    UDS interpretation: Compliant          Medication Assessment Form: Reviewed. Patient indicates being compliant with therapy Treatment compliance: Compliant Risk Assessment Profile: Aberrant behavior: See prior evaluations. None observed or detected today Comorbid factors increasing risk of overdose: See prior notes. No additional risks detected today Risk of substance use disorder (SUD): Moderate-to-High Opioid Risk Tool (ORT) Total Score: 4  Interpretation Table:  Score <3 = Low Risk  for SUD  Score between 4-7 = Moderate Risk for SUD  Score >8 = High Risk for Opioid Abuse   Risk Mitigation Strategies:  Patient Counseling: Covered Patient-Prescriber Agreement (PPA): Present and active  Notification to other healthcare providers: Done  Pharmacologic Plan: No change in therapy, at this time  Laboratory Chemistry  Inflammation Markers Lab Results  Component Value Date   ESRSEDRATE 27 04/03/2015   CRP <0.5 04/03/2015   Renal Function Lab Results  Component Value Date   BUN 8 01/22/2016   CREATININE 0.54 01/22/2016   GFRAA >60 01/22/2016   GFRNONAA >60 01/22/2016   Hepatic Function Lab Results  Component Value Date   AST 23 01/21/2016   ALT 13 (L) 01/21/2016   ALBUMIN 2.9 (L) 01/21/2016   Electrolytes Lab Results  Component Value Date   NA 135 01/22/2016   K 4.2 01/22/2016   CL 106 01/22/2016   CALCIUM 8.2 (L) 01/22/2016   MG 1.9 01/22/2016   Pain Modulating Vitamins Lab Results  Component Value Date   VITAMINB12 591 02/13/2016   Coagulation Parameters Lab Results  Component Value Date   INR 1.32 01/22/2016   LABPROT 16.5 (H) 01/22/2016   APTT 34 01/20/2016   PLT 105 (L) 01/21/2016   Cardiovascular Lab Results  Component Value Date   BNP 929 (H) 09/23/2013   HGB 7.8 (L) 01/21/2016   HCT 22.5 (L) 02/13/2016   Note: Lab results reviewed and made available to patient.  Recent Diagnostic Imaging Review  Ct Entero Abd/pelvis W Contast Result Date: 02/17/2016 CLINICAL DATA:  GI bleed and anemia.  Evaluate for Crohn's disease. EXAM: CT ABDOMEN AND PELVIS WITH CONTRAST (ENTEROGRAPHY) TECHNIQUE: Multidetector CT of the abdomen and pelvis during bolus administration of intravenous contrast. Negative oral contrast was given. CONTRAST:  161m ISOVUE-300 IOPAMIDOL (ISOVUE-300) INJECTION 61% COMPARISON:  11/06/2015 FINDINGS: Lower chest:  The lung bases are clear. Hepatobiliary: The liver has a diffusely irregular contour or. There is hypertrophy of  the caudate lobe and lateral segment of left lobe compatible with cirrhosis. Small stones are noted within the dependent portion of the gallbladder. Pancreas: No mass, inflammatory changes, or other significant abnormality. Spleen: Within normal limits in size and appearance. Adrenals/Urinary Tract: The adrenal glands appear normal. There is a suspicious hyperdense lesion arising from the upper pole of the right kidney measuring 1.7 cm, image 76 of series 6. No hydronephrosis identified. Unremarkable appearance of the left kidney. No hydronephrosis. The urinary bladder appears normal. Stomach/Bowel: The stomach is normal. There is wall thickening involving the proximal small bowel loops, image number 33 of series 2. No abnormal small bowel dilatation. No pathologic dilatation of the colon and no abnormal wall thickening identified. No suspicious enhancing filling defects identified within the small or large bowel loops. No evidence for penetrating disease. Vascular/Lymphatic: Calcified atherosclerotic disease involves the abdominal aorta. No aneurysm. No enlarged retroperitoneal or mesenteric adenopathy. No enlarged pelvic  or inguinal lymph nodes. Reproductive: The uterus appears normal. There is a cystic structure within the right side of pelvis measuring 5.9 cm. This is unchanged from the previous exam and may be of ovarian origin. Other: There is a moderate to marked amount of ascites identified within the abdomen and pelvis. No evidence for abscess. No definite evidence to suggest inflammation. Musculoskeletal: Previous posterior hardware fixation of the thoracic spine, lumbar spine and sacrum. IMPRESSION: 1. Evaluation of the small and large bowel loops is diminished secondary to ascites and changes secondary to portal venous hypertension. 2. Mild wall thickening involving the proximal small bowel loops. Nonspecific in the setting of ascites and portal venous hypertension. 3. No evidence for bowel obstruction,  abscess or penetrating disease. 4. There is a suspicious hyperdense and possibly enhancing lesion arising from the upper pole of right kidney. Cannot rule out a small renal cell carcinoma. Urologic consultation is recommended. 5. Morphologic features of liver compatible with cirrhosis. Stigmata of portal venous hypertension is identified including abdominal and pelvic ascites. 6. Gallstones. Electronically Signed   By: Kerby Moors M.D.   On: 02/17/2016 11:11   Note: Imaging results reviewed.  Meds  The patient has a current medication list which includes the following prescription(s): calcium carbonate, ciprofloxacin, furosemide, gabapentin, lactulose, ledipasvir-sofosbuvir, magnesium oxide, metformin, multivitamin with minerals, omeprazole, oxycodone hcl, oxycodone hcl, oxycodone hcl, paroxetine, phytonadione, ropinirole, spironolactone, sucralfate, and thiamine.  Current Outpatient Prescriptions on File Prior to Visit  Medication Sig  . calcium carbonate (TUMS - DOSED IN MG ELEMENTAL CALCIUM) 500 MG chewable tablet Chew 2 tablets by mouth 2 (two) times daily.  . ciprofloxacin (CIPRO) 500 MG tablet Take 500 mg by mouth 2 (two) times a week. Monday and friday  . furosemide (LASIX) 20 MG tablet Take 20 mg by mouth 2 (two) times daily.   Marland Kitchen gabapentin (NEURONTIN) 400 MG capsule Take 400 mg by mouth 3 (three) times daily.   Marland Kitchen lactulose (CHRONULAC) 10 GM/15ML solution Take 10 g by mouth 3 (three) times daily.   . Ledipasvir-Sofosbuvir (HARVONI) 90-400 MG TABS Take 1 tablet by mouth daily.  . Magnesium Oxide 250 MG TABS Take 1 tablet by mouth daily.  . metFORMIN (GLUCOPHAGE) 500 MG tablet Take 500 mg by mouth 2 (two) times daily with a meal.   . Multiple Vitamin (MULTIVITAMIN WITH MINERALS) TABS tablet Take 1 tablet by mouth daily.  Marland Kitchen omeprazole (PRILOSEC) 20 MG capsule Take 20 mg by mouth daily.  Marland Kitchen PARoxetine (PAXIL) 40 MG tablet Take 40 mg by mouth every morning.  . phytonadione (VITAMIN K) 5 MG  tablet Take 5 mg by mouth 3 (three) times a week. Mon, Wed, Fri  . spironolactone (ALDACTONE) 100 MG tablet Take 200 mg by mouth 2 (two) times daily.   . sucralfate (CARAFATE) 1 GM/10ML suspension Take 1 g by mouth 3 (three) times daily as needed.   . thiamine (VITAMIN B-1) 100 MG tablet Take 1 tablet (100 mg total) by mouth daily.   No current facility-administered medications on file prior to visit.    ROS  Constitutional: Denies any fever or chills Gastrointestinal: No reported hemesis, hematochezia, vomiting, or acute GI distress Musculoskeletal: Denies any acute onset joint swelling, redness, loss of ROM, or weakness Neurological: No reported episodes of acute onset apraxia, aphasia, dysarthria, agnosia, amnesia, paralysis, loss of coordination, or loss of consciousness  Allergies  Ms. Shan has No Known Allergies.  PFSH  Drug: Ms. Bahena  reports that she does not use drugs.  Alcohol:  reports that she does not drink alcohol. Tobacco:  reports that she has been smoking Cigarettes.  She has a 20.00 pack-year smoking history. She has never used smokeless tobacco. Medical:  has a past medical history of Alcohol abuse; Alcoholic cirrhosis of liver without ascites ([place]); Anemia; Anxiety; Arthropathy; Back pain; Bronchitis; BRONCHITIS, ACUTE (11/01/2009); Chronic hepatitis C ([place]); Chronic LBP (12/25/2014); Cirrhosis ([place]); DDD (degenerative disc disease), lumbar (01/01/2015); Depression; Diabetes mellitus without complication ([place]); Edema; Fatigue; Fibromyalgia; High risk medications (not anticoagulants) long-term use; Hyperglycemia; Kidney mass; Left leg pain (01/01/2015); Lumbago; Lumbar radicular pain (01/01/2015); Muscle spasm; Neck pain (01/01/2015); Polyarthritis; Reflux; Restless leg syndrome; RLS (restless legs syndrome); Sacroiliac pain (01/01/2015); Shoulder pain; Sinusitis; SOB (shortness of breath); Spine disorder; Stress headaches; Upper back pain (01/01/2015); Urinary frequency; and Vitamin  D deficiency disease. Family: family history includes [name] in her father; Breast cancer (age of onset: 60) in her sister; Heart disease in her mother; Stroke in her mother.  Past Surgical History:  Procedure Laterality Date  . BACK SURGERY  12/19/2011  . COLONOSCOPY WITH PROPOFOL N/A 09/25/2015   Procedure: COLONOSCOPY WITH PROPOFOL;  Surgeon: Lollie Sails, MD;  Location: [facility] ENDOSCOPY;  Service: Endoscopy;  Laterality: N/A;  . ESOPHAGOGASTRODUODENOSCOPY N/A 04/14/2015   Procedure: ESOPHAGOGASTRODUODENOSCOPY (EGD);  Surgeon: Lollie Sails, MD;  Location: [facility] ENDOSCOPY;  Service: Endoscopy;  Laterality: N/A;  . ESOPHAGOGASTRODUODENOSCOPY (EGD) WITH PROPOFOL N/A 09/16/2015   Procedure: ESOPHAGOGASTRODUODENOSCOPY (EGD) WITH PROPOFOL;  Surgeon: Lollie Sails, MD;  Location: [facility] ENDOSCOPY;  Service: Endoscopy;  Laterality: N/A;  . ESOPHAGOGASTRODUODENOSCOPY (EGD) WITH PROPOFOL N/A 01/22/2016   Procedure: ESOPHAGOGASTRODUODENOSCOPY (EGD) WITH PROPOFOL;  Surgeon: [name], MD;  Location: [place];  Service: Endoscopy;  Laterality: N/A;  . HERNIA REPAIR N/A 10/2015   abdominal   . TONSILLECTOMY    . TUBAL LIGATION    . UMBILICAL HERNIA REPAIR N/A 11/06/2015   Procedure: HERNIA REPAIR UMBILICAL ADULT;  Surgeon: [name], MD;  Location: [facility] ORS;  Service: General;  Laterality: N/A;   Constitutional Exam  General appearance: alert, cooperative, depressed, oriented, in no distress, well nourished, well hydrated and pale. Globose abdomen with ascitic fluid. Vitals:   02/23/2016 1057  BP: (!) 107/58  Pulse: 82  Resp: 16  Temp: 98.7 F (37.1 C)  TempSrc: Oral  SpO2: 99%  Weight: 145 lb (65.8 kg)  Height: '5\' 10"'$  (1.778 m)   BMI Assessment: Estimated body mass index is 20.81 kg/m as calculated from the following:   Height as of this encounter: '5\' 10"'$  (1.778 m).   Weight as of this encounter: 145 lb (65.8 kg).  BMI interpretation table: BMI level Category  Range association with higher incidence of chronic pain  <18 kg/m2 Underweight   18.5-24.9 kg/m2 Ideal body weight   25-29.9 kg/m2 Overweight Increased incidence by 20%  30-34.9 kg/m2 Obese (Class I) Increased incidence by 68%  35-39.9 kg/m2 Severe obesity (Class II) Increased incidence by 136%  >40 kg/m2 Extreme obesity (Class III) Increased incidence by 254%   BMI Readings from Last 4 Encounters:  02/13/2016 20.81 kg/m  02/11/16 21.45 kg/m  01/28/16 20.67 kg/m  01/21/16 20.10 kg/m   Wt Readings from Last 4 Encounters:  02/17/2016 145 lb (65.8 kg)  02/11/16 145 lb 4.5 oz (65.9 kg)  01/28/16 140 lb (63.5 kg)  01/21/16 136 lb 1.6 oz (61.7 kg)  Psych/Mental status: Alert, oriented x 3 (person, place, & time) Eyes: PERLA Respiratory: No evidence of acute respiratory distress  Cervical Spine Exam  Inspection: No masses, redness, or swelling Alignment: Symmetrical Functional ROM: Unrestricted ROM Stability: No instability detected Muscle strength & Tone: Functionally intact Sensory: Unimpaired Palpation: Non-contributory  Upper Extremity (UE) Exam    Side: Right upper extremity  Side: Left upper extremity  Inspection: No masses, redness, swelling, or asymmetry  Inspection: No masses, redness, swelling, or asymmetry  Functional ROM: Unrestricted ROM          Functional ROM: Unrestricted ROM          Muscle strength & Tone: Functionally intact  Muscle strength & Tone: Functionally intact  Sensory: Unimpaired  Sensory: Unimpaired  Palpation: Non-contributory  Palpation: Non-contributory   Thoracic Spine Exam  Inspection: No masses, redness, or swelling Alignment: Symmetrical Functional ROM: Unrestricted ROM Stability: No instability detected Sensory: Unimpaired Muscle strength & Tone: Functionally intact Palpation: Non-contributory  Lumbar Spine Exam  Inspection: No masses, redness, or swelling Alignment: Symmetrical Functional ROM: Decreased ROM Stability: No instability  detected Muscle strength & Tone: Functionally intact Sensory: Movement-associated pain Palpation: Complains of area being tender to palpation Provocative Tests: Lumbar Hyperextension and rotation test: Positive bilaterally for facet joint pain. Patrick's Maneuver: evaluation deferred today              Gait & Posture Assessment  Ambulation: Unassisted Gait: Relatively normal for age and body habitus Posture: WNL   Lower Extremity Exam    Side: Right lower extremity  Side: Left lower extremity  Inspection: No masses, redness, swelling, or asymmetry  Inspection: No masses, redness, swelling, or asymmetry  Functional ROM: Unrestricted ROM          Functional ROM: Unrestricted ROM          Muscle strength & Tone: Functionally intact  Muscle strength & Tone: Functionally intact  Sensory: Unimpaired  Sensory: Unimpaired  Palpation: Non-contributory  Palpation: Non-contributory   Assessment  Primary Diagnosis & Pertinent Problem List: The primary encounter diagnosis was Chronic pain syndrome. Diagnoses of Long term prescription opiate use, Opiate use, Chronic bilateral low back pain with left-sided sciatica, Chronic lumbar radicular pain (Location of Secondary source of pain) (Left), Failed back surgical syndrome, Alcoholic cirrhosis of liver with ascites ([place]), and RLS (restless legs syndrome) were also pertinent to this visit.  Visit Diagnosis: 1. Chronic pain syndrome   2. Long term prescription opiate use   3. Opiate use   4. Chronic bilateral low back pain with left-sided sciatica   5. Chronic lumbar radicular pain (Location of Secondary source of pain) (Left)   6. Failed back surgical syndrome   7. Alcoholic cirrhosis of liver with ascites ([place])   8. RLS (restless legs syndrome)    Plan of Care  Pharmacotherapy (Medications Ordered): Meds ordered this encounter  Medications  . DISCONTD: Oxycodone HCl 20 MG TABS    Sig: Take 1 tablet (20 mg total) by mouth every 6 (six) hours as  needed.    Dispense:  120 tablet    Refill:  0    Patient may have prescription filled one day early if pharmacy is closed on scheduled refill date. Do not fill until: 03/28/16 To last until: 04/27/16  . DISCONTD: Oxycodone HCl 20 MG TABS    Sig: Take 1 tablet (20 mg total) by mouth every 6 (six) hours as needed.    Dispense:  120 tablet    Refill:  0    Patient may have prescription filled one day early if pharmacy is closed on scheduled refill date. Do not  fill until: 04/27/16 To last until: 05/27/16  . Oxycodone HCl 20 MG TABS    Sig: Take 1 tablet (20 mg total) by mouth every 6 (six) hours.    Dispense:  120 tablet    Refill:  0    Do not add this medication to the electronic "Automatic Refill" notification system. Patient may have prescription filled one day early if pharmacy is closed on scheduled refill date. Do not fill until: 02/27/16 To last until: 03/28/16  . Oxycodone HCl 20 MG TABS    Sig: Take 1 tablet (20 mg total) by mouth every 6 (six) hours as needed.    Dispense:  120 tablet    Refill:  0    Patient may have prescription filled one day early if pharmacy is closed on scheduled refill date. Do not fill until: 03/28/16 To last until: 04/27/16  . Oxycodone HCl 20 MG TABS    Sig: Take 1 tablet (20 mg total) by mouth every 6 (six) hours as needed.    Dispense:  120 tablet    Refill:  0    Patient may have prescription filled one day early if pharmacy is closed on scheduled refill date. Do not fill until: 04/27/16 To last until: 05/27/16   New Prescriptions   OXYCODONE HCL 20 MG TABS    Take 1 tablet (20 mg total) by mouth every 6 (six) hours as needed.   OXYCODONE HCL 20 MG TABS    Take 1 tablet (20 mg total) by mouth every 6 (six) hours as needed.   Medications administered today: Ms. Methot had no medications administered during this visit. Lab-work, procedure(s), and/or referral(s): No orders of the defined types were placed in this encounter.  Imaging and/or  referral(s): None  Interventional therapies: Planned, scheduled, and/or pending:   None at this time due to thrombocytopenia, alcoholic liver cirrhosis, and increased risk of possible complications.    Considering:   None at this time due to thrombocytopenia, alcoholic liver cirrhosis, and increased risk of possible complications.    Palliative PRN treatment(s):   Not at this time.   Provider-requested follow-up: Return in about 3 months (around 05/26/2016) for Med-Mgmt.  Future Appointments Date Time Provider Department Center  02/25/2016 1:30 PM [facility] 6 [facility] None  03/03/2016 1:30 PM [facility] 6 [facility] None  03/10/2016 1:30 PM [facility] 5 [facility] None  05/20/2016 10:30 AM Delano Metz, MD [facility] None   Primary Care Physician: Sherron Monday, MD Location: [facility] Outpatient Pain Management Facility Note by: Sydnee Levans. Laban Emperor, M.D, DABA, DABAPM, DABPM, DABIPP, FIPP  Pain Score Disclaimer: We use the NRS-11 scale. This is a self-reported, subjective measurement of pain severity with only modest accuracy. It is used primarily to identify changes within a particular patient. It must be understood that outpatient pain scales are significantly less accurate that those used for research, where they can be applied under ideal controlled circumstances with minimal exposure to variables. In reality, the score is likely to be a combination of pain intensity and pain affect, where pain affect describes the degree of emotional arousal or changes in action readiness caused by the sensory experience of pain. Factors such as social and work situation, setting, emotional state, anxiety levels, expectation, and prior pain experience may influence pain perception and show large inter-individual differences that may also be affected by time variables.  Patient instructions provided during this appointment: There are no Patient  Instructions on file  for this visit.

## 2016-02-25 ENCOUNTER — Inpatient Hospital Stay: Payer: Medicare Other

## 2016-02-25 VITALS — BP 106/68 | HR 80 | Resp 18

## 2016-02-25 DIAGNOSIS — D500 Iron deficiency anemia secondary to blood loss (chronic): Secondary | ICD-10-CM

## 2016-02-25 MED ORDER — SODIUM CHLORIDE 0.9 % IV SOLN
Freq: Once | INTRAVENOUS | Status: AC
  Administered 2016-02-25: 14:00:00 via INTRAVENOUS
  Filled 2016-02-25: qty 1000

## 2016-02-25 MED ORDER — SODIUM CHLORIDE 0.9 % IV SOLN: 200.0000 mg | Freq: Once | INTRAVENOUS | Status: DC

## 2016-02-25 MED ORDER — IRON SUCROSE 20 MG/ML IV SOLN
200.0000 mg | Freq: Once | INTRAVENOUS | Status: AC
  Administered 2016-02-25: 200 mg via INTRAVENOUS
  Filled 2016-02-25: qty 10

## 2016-02-27 DIAGNOSIS — 419620001 Death: Secondary | SNOMED CT | POA: Insufficient documentation

## 2016-02-27 DEATH — deceased

## 2016-03-03 ENCOUNTER — Inpatient Hospital Stay: Payer: Medicare Other | Attending: Internal Medicine

## 2016-03-03 VITALS — BP 102/66 | HR 75 | Temp 99.0°F | Resp 18

## 2016-03-03 DIAGNOSIS — E559 Vitamin D deficiency, unspecified: Secondary | ICD-10-CM | POA: Insufficient documentation

## 2016-03-03 DIAGNOSIS — F17210 Nicotine dependence, cigarettes, uncomplicated: Secondary | ICD-10-CM | POA: Insufficient documentation

## 2016-03-03 DIAGNOSIS — D689 Coagulation defect, unspecified: Secondary | ICD-10-CM | POA: Diagnosis not present

## 2016-03-03 DIAGNOSIS — F329 Major depressive disorder, single episode, unspecified: Secondary | ICD-10-CM | POA: Insufficient documentation

## 2016-03-03 DIAGNOSIS — B182 Chronic viral hepatitis C: Secondary | ICD-10-CM | POA: Insufficient documentation

## 2016-03-03 DIAGNOSIS — E119 Type 2 diabetes mellitus without complications: Secondary | ICD-10-CM | POA: Diagnosis not present

## 2016-03-03 DIAGNOSIS — F1020 Alcohol dependence, uncomplicated: Secondary | ICD-10-CM | POA: Insufficient documentation

## 2016-03-03 DIAGNOSIS — F419 Anxiety disorder, unspecified: Secondary | ICD-10-CM | POA: Insufficient documentation

## 2016-03-03 DIAGNOSIS — Z7984 Long term (current) use of oral hypoglycemic drugs: Secondary | ICD-10-CM | POA: Diagnosis not present

## 2016-03-03 DIAGNOSIS — M5116 Intervertebral disc disorders with radiculopathy, lumbar region: Secondary | ICD-10-CM | POA: Diagnosis not present

## 2016-03-03 DIAGNOSIS — D500 Iron deficiency anemia secondary to blood loss (chronic): Secondary | ICD-10-CM | POA: Diagnosis not present

## 2016-03-03 DIAGNOSIS — K7460 Unspecified cirrhosis of liver: Secondary | ICD-10-CM | POA: Insufficient documentation

## 2016-03-03 DIAGNOSIS — M797 Fibromyalgia: Secondary | ICD-10-CM | POA: Diagnosis not present

## 2016-03-03 DIAGNOSIS — Z79899 Other long term (current) drug therapy: Secondary | ICD-10-CM | POA: Diagnosis not present

## 2016-03-03 DIAGNOSIS — G2581 Restless legs syndrome: Secondary | ICD-10-CM | POA: Diagnosis not present

## 2016-03-03 DIAGNOSIS — I10 Essential (primary) hypertension: Secondary | ICD-10-CM | POA: Insufficient documentation

## 2016-03-03 DIAGNOSIS — M5412 Radiculopathy, cervical region: Secondary | ICD-10-CM | POA: Diagnosis not present

## 2016-03-03 DIAGNOSIS — K219 Gastro-esophageal reflux disease without esophagitis: Secondary | ICD-10-CM | POA: Insufficient documentation

## 2016-03-03 MED ORDER — IRON SUCROSE 20 MG/ML IV SOLN
200.0000 mg | Freq: Once | INTRAVENOUS | Status: AC
  Administered 2016-03-03: 200 mg via INTRAVENOUS
  Filled 2016-03-03: qty 10

## 2016-03-03 MED ORDER — IRON SUCROSE 20 MG/ML IV SOLN: 200.0000 mg | Freq: Once | INTRAVENOUS | Status: DC

## 2016-03-03 MED ORDER — SODIUM CHLORIDE 0.9 % IV SOLN
Freq: Once | INTRAVENOUS | Status: AC
  Administered 2016-03-03: 14:00:00 via INTRAVENOUS
  Filled 2016-03-03: qty 1000

## 2016-03-09 ENCOUNTER — Ambulatory Visit (INDEPENDENT_AMBULATORY_CARE_PROVIDER_SITE_OTHER): Payer: Medicare Other | Admitting: Urology

## 2016-03-09 ENCOUNTER — Encounter: Payer: Self-pay | Admitting: Urology

## 2016-03-09 VITALS — BP 113/75 | HR 71 | Ht 70.0 in | Wt 146.0 lb

## 2016-03-09 DIAGNOSIS — N2889 Other specified disorders of kidney and ureter: Secondary | ICD-10-CM | POA: Diagnosis not present

## 2016-03-09 NOTE — Progress Notes (Signed)
03/09/2016 1:38 PM   Audrey Garrett [birth year] [id number]  Referring provider: Jodi Marble, MD [address] [place], [place] [address]  Chief Complaint  Patient presents with  . New Patient (Initial Visit)    lesion on kidney    HPI: 55 yo F who presents today for further evaluation of a possible enhancing 1.7 cm right upper pole renal mass which was incidentally discovered on CT abdomen and pelvis with contrast.  This study was ordered in order to further assess source of GI bleeding and anemia.    In retrospect, on imaging back in 2017, the lesion was quite subtle but present measuring approximate one severe at the time.  She denies any flank pain although she has chronic spine issues and at large amount of spinal hardware from scoliosis.  She denies any flank pain, gross hematuria, or any other urinary symptoms.  She was seen previously in our office by Zara Council for history of difficulty voiding which is since completely resolved.  Her past medical history is significant for multiple medical problems including a history of alcoholic cirrhosis, stage IV, with significant ascites, hep C currently on Harvani, DM II and recent small bowel incarceration. She is being evaluated at [facility] for possible liver transplantation, is not currently a candidate and is being treated for her hep C.  She is no longer drinking.   PMH: Past Medical History:  Diagnosis Date  . Alcohol abuse   . Alcoholic cirrhosis of liver without ascites ([place])   . Anemia   . Anxiety   . Arthropathy   . Back pain   . Bronchitis   . BRONCHITIS, ACUTE 11/01/2009   Qualifier: Diagnosis of  By: Alveta Heimlich MD, Cornelia Copa    . Chronic hepatitis C ([place])   . Chronic LBP 12/25/2014   Overview:  Post extensive surgery   . Cirrhosis ([place])   . DDD (degenerative disc disease), lumbar 01/01/2015  . Depression   . Diabetes mellitus without complication ([place])   . Edema   . Fatigue   . Fibromyalgia   . High risk medications  (not anticoagulants) long-term use   . Hyperglycemia   . Kidney mass    11/17 small mass-  . Left leg pain 01/01/2015  . Lumbago   . Lumbar radicular pain 01/01/2015  . Muscle spasm   . Neck pain 01/01/2015  . Polyarthritis   . Reflux   . Restless leg syndrome    01/2016  . RLS (restless legs syndrome)   . Sacroiliac pain 01/01/2015  . Shoulder pain   . Sinusitis   . SOB (shortness of breath)   . Spine disorder   . Stress headaches   . Upper back pain 01/01/2015  . Urinary frequency   . Vitamin D deficiency disease     Surgical History: Past Surgical History:  Procedure Laterality Date  . BACK SURGERY  12/19/2011  . COLONOSCOPY WITH PROPOFOL N/A 09/25/2015   Procedure: COLONOSCOPY WITH PROPOFOL;  Surgeon: Lollie Sails, MD;  Location: [facility] ENDOSCOPY;  Service: Endoscopy;  Laterality: N/A;  . ESOPHAGOGASTRODUODENOSCOPY N/A 04/14/2015   Procedure: ESOPHAGOGASTRODUODENOSCOPY (EGD);  Surgeon: Lollie Sails, MD;  Location: [facility] ENDOSCOPY;  Service: Endoscopy;  Laterality: N/A;  . ESOPHAGOGASTRODUODENOSCOPY (EGD) WITH PROPOFOL N/A 09/16/2015   Procedure: ESOPHAGOGASTRODUODENOSCOPY (EGD) WITH PROPOFOL;  Surgeon: Lollie Sails, MD;  Location: [facility] ENDOSCOPY;  Service: Endoscopy;  Laterality: N/A;  . ESOPHAGOGASTRODUODENOSCOPY (EGD) WITH PROPOFOL N/A 01/22/2016   Procedure: ESOPHAGOGASTRODUODENOSCOPY (EGD) WITH PROPOFOL;  Surgeon: Mallie Mussel  Evern Bio, MD;  Location: [facility] ENDOSCOPY;  Service: Endoscopy;  Laterality: N/A;  . HERNIA REPAIR N/A 10/2015   abdominal   . TONSILLECTOMY    . TUBAL LIGATION    . UMBILICAL HERNIA REPAIR N/A 11/06/2015   Procedure: HERNIA REPAIR UMBILICAL ADULT;  Surgeon: [name], MD;  Location: [facility] ORS;  Service: General;  Laterality: N/A;    Home Medications:    Medication List       Accurate as of 03/09/16 11:59 PM. Always use your most recent med list.          calcium carbonate 500 MG chewable tablet Commonly known as:  TUMS - dosed in  mg elemental calcium Chew 2 tablets by mouth 2 (two) times daily.   ciprofloxacin 500 MG tablet Commonly known as:  CIPRO Take 500 mg by mouth 2 (two) times a week. Monday and friday   furosemide 20 MG tablet Commonly known as:  LASIX Take 20 mg by mouth 2 (two) times daily.   gabapentin 400 MG capsule Commonly known as:  NEURONTIN Take 400 mg by mouth 3 (three) times daily.   HARVONI 90-400 MG Tabs Generic drug:  Ledipasvir-Sofosbuvir Take 1 tablet by mouth daily.   lactulose 10 GM/15ML solution Commonly known as:  CHRONULAC Take 10 g by mouth 3 (three) times daily.   Magnesium Oxide 250 MG Tabs Take 1 tablet by mouth daily.   metFORMIN 500 MG tablet Commonly known as:  GLUCOPHAGE Take 500 mg by mouth 2 (two) times daily with a meal.   multivitamin with minerals Tabs tablet Take 1 tablet by mouth daily.   omeprazole 20 MG capsule Commonly known as:  PRILOSEC Take 20 mg by mouth daily.   Oxycodone HCl 20 MG Tabs Take 1 tablet (20 mg total) by mouth every 6 (six) hours.   Oxycodone HCl 20 MG Tabs Take 1 tablet (20 mg total) by mouth every 6 (six) hours as needed. Start taking on:  03/28/2016   Oxycodone HCl 20 MG Tabs Take 1 tablet (20 mg total) by mouth every 6 (six) hours as needed. Start taking on:  04/27/2016   PARoxetine 40 MG tablet Commonly known as:  PAXIL Take 40 mg by mouth every morning.   phytonadione 5 MG tablet Commonly known as:  VITAMIN K Take 5 mg by mouth 3 (three) times a week. Mon, Wed, Fri   rOPINIRole 0.25 MG tablet Commonly known as:  REQUIP Take 0.25 mg by mouth at bedtime.   spironolactone 100 MG tablet Commonly known as:  ALDACTONE Take 200 mg by mouth 2 (two) times daily.   sucralfate 1 GM/10ML suspension Commonly known as:  CARAFATE Take 1 g by mouth 3 (three) times daily as needed.   thiamine 100 MG tablet Commonly known as:  VITAMIN B-1 Take 1 tablet (100 mg total) by mouth daily.       Allergies: No Known  Allergies  Family History: Family History  Problem Relation Age of Onset  . Stroke Mother   . Heart disease Mother   . [name] Father   . Breast cancer Sister 10  . Kidney disease Neg Hx   . Bladder Cancer Neg Hx   . Kidney cancer Neg Hx   . Prostate cancer Neg Hx     Social History:  reports that she has been smoking Cigarettes.  She has a 20.00 pack-year smoking history. She has never used smokeless tobacco. She reports that she does not drink alcohol or use  drugs.  ROS: UROLOGY Frequent Urination?: No Hard to postpone urination?: No Burning/pain with urination?: No Get up at night to urinate?: Yes Leakage of urine?: No Urine stream starts and stops?: No Trouble starting stream?: No Do you have to strain to urinate?: No Blood in urine?: No Urinary tract infection?: No Sexually transmitted disease?: No Injury to kidneys or bladder?: No Painful intercourse?: No Weak stream?: No Currently pregnant?: No Vaginal bleeding?: No Last menstrual period?: 2009  Gastrointestinal Nausea?: No Vomiting?: No Indigestion/heartburn?: No Diarrhea?: No Constipation?: Yes  Constitutional Fever: No Night sweats?: Yes Weight loss?: Yes Fatigue?: Yes  Skin Skin rash/lesions?: No Itching?: Yes  Eyes Blurred vision?: Yes Double vision?: Yes  Ears/Nose/Throat Sore throat?: Yes Sinus problems?: Yes  Hematologic/Lymphatic Swollen glands?: No Easy bruising?: Yes  Cardiovascular Leg swelling?: Yes Chest pain?: No  Respiratory Cough?: No Shortness of breath?: No  Endocrine Excessive thirst?: Yes  Musculoskeletal Back pain?: Yes Joint pain?: Yes  Neurological Headaches?: Yes Dizziness?: Yes  Psychologic Depression?: Yes Anxiety?: Yes  Physical Exam: BP 113/75 (BP Location: Right Arm, Patient Position: Sitting, Cuff Size: Normal)   Pulse 71   Ht '5\' 10"'$  (1.778 m)   Wt 146 lb (66.2 kg)   BMI 20.95 kg/m   Constitutional:  Alert and oriented, No acute  distress.  Accompanied by husband today. HEENT: [place] AT, moist mucus membranes.  Trachea midline, no masses. Cardiovascular: No clubbing, cyanosis, or edema. Respiratory: Normal respiratory effort, no increased work of breathing. GI: Abdomen is soft, nontender, no abdominal masses, mild distention from ascitic fluid, umbilical incision appreciated. GU: No CVA tenderness.  Skin: No rashes, bruises or suspicious lesions. Neurologic: Grossly intact, no focal deficits, moving all 4 extremities. Psychiatric: Normal mood and affect.  Laboratory Data: Lab Results  Component Value Date   WBC 7.2 01/21/2016   HGB 7.8 (L) 01/21/2016   HCT 22.5 (L) 02/13/2016   MCV 85.2 01/21/2016   PLT 105 (L) 01/21/2016    Lab Results  Component Value Date   CREATININE 0.54 01/22/2016    Lab Results  Component Value Date   HGBA1C 5.3 05/22/2015    Pertinent Imaging: Study Result   CLINICAL DATA:  GI bleed and anemia.  Evaluate for Crohn's disease.  EXAM: CT ABDOMEN AND PELVIS WITH CONTRAST (ENTEROGRAPHY)  TECHNIQUE: Multidetector CT of the abdomen and pelvis during bolus administration of intravenous contrast. Negative oral contrast was given.  CONTRAST:  139m ISOVUE-300 IOPAMIDOL (ISOVUE-300) INJECTION 61%  COMPARISON:  11/06/2015  FINDINGS: Lower chest:  The lung bases are clear.  Hepatobiliary: The liver has a diffusely irregular contour or. There is hypertrophy of the caudate lobe and lateral segment of left lobe compatible with cirrhosis. Small stones are noted within the dependent portion of the gallbladder.  Pancreas: No mass, inflammatory changes, or other significant abnormality.  Spleen: Within normal limits in size and appearance.  Adrenals/Urinary Tract: The adrenal glands appear normal. There is a suspicious hyperdense lesion arising from the upper pole of the right kidney measuring 1.7 cm, image 76 of series 6. No hydronephrosis identified. Unremarkable  appearance of the left kidney. No hydronephrosis. The urinary bladder appears normal.  Stomach/Bowel: The stomach is normal. There is wall thickening involving the proximal small bowel loops, image number 33 of series 2. No abnormal small bowel dilatation. No pathologic dilatation of the colon and no abnormal wall thickening identified. No suspicious enhancing filling defects identified within the small or large bowel loops. No evidence for penetrating disease.  Vascular/Lymphatic: Calcified  atherosclerotic disease involves the abdominal aorta. No aneurysm. No enlarged retroperitoneal or mesenteric adenopathy. No enlarged pelvic or inguinal lymph nodes.  Reproductive: The uterus appears normal. There is a cystic structure within the right side of pelvis measuring 5.9 cm. This is unchanged from the previous exam and may be of ovarian origin.  Other: There is a moderate to marked amount of ascites identified within the abdomen and pelvis. No evidence for abscess. No definite evidence to suggest inflammation.  Musculoskeletal: Previous posterior hardware fixation of the thoracic spine, lumbar spine and sacrum.  IMPRESSION: 1. Evaluation of the small and large bowel loops is diminished secondary to ascites and changes secondary to portal venous hypertension. 2. Mild wall thickening involving the proximal small bowel loops. Nonspecific in the setting of ascites and portal venous hypertension. 3. No evidence for bowel obstruction, abscess or penetrating disease. 4. There is a suspicious hyperdense and possibly enhancing lesion arising from the upper pole of right kidney. Cannot rule out a small renal cell carcinoma. Urologic consultation is recommended. 5. Morphologic features of liver compatible with cirrhosis. Stigmata of portal venous hypertension is identified including abdominal and pelvic ascites. 6. Gallstones.   Electronically Signed   By: Kerby Moors M.D.    On: 02/17/2016 11:11    CT scan reviewed personally today with the patient. This is compared to previous scans including a CT abdomen pelvis with 2012.  Assessment & Plan:  56 year old female with multiple medical problems with a 17 mm enhancing right upper pole renal mass, increased from 1 cm in 2012.  1. Right renal mass Right upper pole possibly enhancing renal mass, very slow interval growth since 2012. At this point, the lesion was not completely characterize given that the study was performed with contrast only and no noncontrast phase was performed to completely assess enhancement.  A solid renal mass raises the suspicion of primary renal malignancy.  We discussed this in detail and in regards to the spectrum of renal masses which includes cysts (pure cysts are considered benign), solid masses and everything in between. The risk of metastasis increases as the size of solid renal mass increases. In general, it is believed that the risk of metastasis for renal masses less than 3-4 cm is small (up to approximately 5%) based mainly on large retrospective studies.  In some cases and especially in patients of older age and multiple comorbidities a surveillance approach may be appropriate. The treatment of solid renal masses includes: surveillance, cryoablation (percutaneous and laparoscopic) in addition to partial and complete nephrectomy (each with option of laparoscopic, robotic and open depending on appropriateness). Furthermore, nephrectomy appears to be an independent risk factor for the development of chronic kidney disease suggesting that nephron sparing approaches should be implored whenever feasible. We reviewed these options in context of the patients current situation as well as the pros and cons of each.  Given her multiple medical issues and slow interval growth, and recommended a CT abdomen with and without contrast (renal protocol) in 6 month both to assess interval growth as well as  fully characterize the lesion. At that point, pending her renal transplantation status, we will consider intervention for the lesion if it's suspicious for RCC, including decision for possible biopsy.  Patient understands the plan and is agreeable. She understands the need for follow-up of this and has agreed to return in 6 months.  - CT RENAL ABDOMEN W WO CONTRAST; Future   Return in about 6 months (around 09/07/2016) for CT abd  with and without.  Hollice Espy, MD  [facility] [address] [place], [place] [address] [phone]  I spent 25 min with this patient of which greater than 50% was spent in counseling and coordination of care with the patient.

## 2016-03-10 ENCOUNTER — Inpatient Hospital Stay: Payer: Medicare Other

## 2016-03-10 VITALS — BP 106/62 | HR 78 | Temp 97.4°F

## 2016-03-10 DIAGNOSIS — D500 Iron deficiency anemia secondary to blood loss (chronic): Secondary | ICD-10-CM

## 2016-03-10 MED ORDER — SODIUM CHLORIDE 0.9 % IV SOLN: 200.0000 mg | Freq: Once | INTRAVENOUS | Status: DC

## 2016-03-10 MED ORDER — SODIUM CHLORIDE 0.9 % IV SOLN
Freq: Once | INTRAVENOUS | Status: AC
  Administered 2016-03-10: 15:00:00 via INTRAVENOUS
  Filled 2016-03-10: qty 1000

## 2016-03-10 MED ORDER — IRON SUCROSE 20 MG/ML IV SOLN
200.0000 mg | Freq: Once | INTRAVENOUS | Status: AC
  Administered 2016-03-10: 200 mg via INTRAVENOUS
  Filled 2016-03-10: qty 10

## 2016-03-16 ENCOUNTER — Ambulatory Visit
Admission: RE | Admit: 2016-03-16 | Discharge: 2016-03-16 | Disposition: A | Discharge status: Home / Self Care | Payer: Medicare Other | Source: Ambulatory Visit | Attending: Gastroenterology | Admitting: Gastroenterology

## 2016-03-16 DIAGNOSIS — D500 Iron deficiency anemia secondary to blood loss (chronic): Secondary | ICD-10-CM | POA: Diagnosis not present

## 2016-03-17 ENCOUNTER — Ambulatory Visit
Admission: RE | Admit: 2016-03-17 | Discharge: 2016-03-17 | Disposition: A | Discharge status: Home / Self Care | Payer: Medicare Other | Source: Ambulatory Visit | Attending: Gastroenterology | Admitting: Gastroenterology

## 2016-03-17 DIAGNOSIS — D500 Iron deficiency anemia secondary to blood loss (chronic): Secondary | ICD-10-CM | POA: Diagnosis not present

## 2016-03-17 LAB — PREPARE RBC (CROSSMATCH)

## 2016-03-17 MED ORDER — SODIUM CHLORIDE 0.9 % IV SOLN
Freq: Once | INTRAVENOUS | Status: AC
  Administered 2016-03-17: 09:00:00 via INTRAVENOUS

## 2016-03-18 LAB — TYPE AND SCREEN
ABO/RH(D): O POS
Antibody Screen: NEGATIVE
Extend sample reason: TRANSFUSED
Unit division: 0
Unit division: 0

## 2016-04-01 ENCOUNTER — Encounter: Payer: Self-pay | Admitting: *Deleted

## 2016-04-01 ENCOUNTER — Ambulatory Visit: Payer: Medicare Other | Admitting: Anesthesiology

## 2016-04-01 ENCOUNTER — Ambulatory Visit
Admission: RE | Admit: 2016-04-01 | Discharge: 2016-04-01 | Disposition: A | Discharge status: Home / Self Care | Payer: Medicare Other | Source: Ambulatory Visit | Attending: Gastroenterology | Admitting: Gastroenterology

## 2016-04-01 ENCOUNTER — Encounter
Admission: RE | Disposition: A | Discharge status: Home / Self Care | Payer: Self-pay | Source: Ambulatory Visit | Attending: Gastroenterology

## 2016-04-01 DIAGNOSIS — M797 Fibromyalgia: Secondary | ICD-10-CM | POA: Insufficient documentation

## 2016-04-01 DIAGNOSIS — K7460 Unspecified cirrhosis of liver: Secondary | ICD-10-CM | POA: Diagnosis not present

## 2016-04-01 DIAGNOSIS — D500 Iron deficiency anemia secondary to blood loss (chronic): Secondary | ICD-10-CM | POA: Insufficient documentation

## 2016-04-01 DIAGNOSIS — E559 Vitamin D deficiency, unspecified: Secondary | ICD-10-CM | POA: Insufficient documentation

## 2016-04-01 DIAGNOSIS — Z79899 Other long term (current) drug therapy: Secondary | ICD-10-CM | POA: Insufficient documentation

## 2016-04-01 DIAGNOSIS — Z823 Family history of stroke: Secondary | ICD-10-CM | POA: Diagnosis not present

## 2016-04-01 DIAGNOSIS — I10 Essential (primary) hypertension: Secondary | ICD-10-CM | POA: Insufficient documentation

## 2016-04-01 DIAGNOSIS — E119 Type 2 diabetes mellitus without complications: Secondary | ICD-10-CM | POA: Diagnosis not present

## 2016-04-01 DIAGNOSIS — M545 Low back pain: Secondary | ICD-10-CM | POA: Insufficient documentation

## 2016-04-01 DIAGNOSIS — K3189 Other diseases of stomach and duodenum: Secondary | ICD-10-CM | POA: Diagnosis not present

## 2016-04-01 DIAGNOSIS — G2581 Restless legs syndrome: Secondary | ICD-10-CM | POA: Insufficient documentation

## 2016-04-01 DIAGNOSIS — F5104 Psychophysiologic insomnia: Secondary | ICD-10-CM | POA: Insufficient documentation

## 2016-04-01 DIAGNOSIS — G8929 Other chronic pain: Secondary | ICD-10-CM | POA: Diagnosis not present

## 2016-04-01 DIAGNOSIS — K5730 Diverticulosis of large intestine without perforation or abscess without bleeding: Secondary | ICD-10-CM | POA: Diagnosis not present

## 2016-04-01 DIAGNOSIS — K219 Gastro-esophageal reflux disease without esophagitis: Secondary | ICD-10-CM | POA: Diagnosis not present

## 2016-04-01 DIAGNOSIS — Z803 Family history of malignant neoplasm of breast: Secondary | ICD-10-CM | POA: Insufficient documentation

## 2016-04-01 DIAGNOSIS — K254 Chronic or unspecified gastric ulcer with hemorrhage: Secondary | ICD-10-CM | POA: Insufficient documentation

## 2016-04-01 DIAGNOSIS — Z86010 Personal history of colonic polyps: Secondary | ICD-10-CM | POA: Insufficient documentation

## 2016-04-01 DIAGNOSIS — B179 Acute viral hepatitis, unspecified: Secondary | ICD-10-CM | POA: Diagnosis not present

## 2016-04-01 DIAGNOSIS — F329 Major depressive disorder, single episode, unspecified: Secondary | ICD-10-CM | POA: Insufficient documentation

## 2016-04-01 DIAGNOSIS — Z7984 Long term (current) use of oral hypoglycemic drugs: Secondary | ICD-10-CM | POA: Insufficient documentation

## 2016-04-01 DIAGNOSIS — K644 Residual hemorrhoidal skin tags: Secondary | ICD-10-CM | POA: Diagnosis not present

## 2016-04-01 DIAGNOSIS — M5116 Intervertebral disc disorders with radiculopathy, lumbar region: Secondary | ICD-10-CM | POA: Insufficient documentation

## 2016-04-01 DIAGNOSIS — F419 Anxiety disorder, unspecified: Secondary | ICD-10-CM | POA: Insufficient documentation

## 2016-04-01 DIAGNOSIS — K2950 Unspecified chronic gastritis without bleeding: Secondary | ICD-10-CM | POA: Diagnosis not present

## 2016-04-01 DIAGNOSIS — K921 Melena: Secondary | ICD-10-CM | POA: Diagnosis present

## 2016-04-01 DIAGNOSIS — F17200 Nicotine dependence, unspecified, uncomplicated: Secondary | ICD-10-CM | POA: Insufficient documentation

## 2016-04-01 DIAGNOSIS — Z8249 Family history of ischemic heart disease and other diseases of the circulatory system: Secondary | ICD-10-CM | POA: Insufficient documentation

## 2016-04-01 HISTORY — DX: Gastro-esophageal reflux disease without esophagitis: K21.9

## 2016-04-01 HISTORY — DX: Diverticulosis of intestine, part unspecified, without perforation or abscess without bleeding: K57.90

## 2016-04-01 HISTORY — PX: COLONOSCOPY WITH PROPOFOL: SHX5780

## 2016-04-01 HISTORY — PX: ESOPHAGOGASTRODUODENOSCOPY (EGD) WITH PROPOFOL: SHX5813

## 2016-04-01 LAB — CBC WITH DIFFERENTIAL/PLATELET
Basophils Absolute: 0 10*3/uL (ref 0–0.1)
Basophils Relative: 1 %
Eosinophils Absolute: 0.1 10*3/uL (ref 0–0.7)
Eosinophils Relative: 2 %
HCT: 24.5 % — ABNORMAL LOW (ref 35.0–47.0)
Hemoglobin: 7.9 g/dL — ABNORMAL LOW (ref 12.0–16.0)
Lymphocytes Relative: 20 %
Lymphs Abs: 0.9 10*3/uL — ABNORMAL LOW (ref 1.0–3.6)
MCH: 28.9 pg (ref 26.0–34.0)
MCHC: 32.4 g/dL (ref 32.0–36.0)
MCV: 89.2 fL (ref 80.0–100.0)
Monocytes Absolute: 0.5 10*3/uL (ref 0.2–0.9)
Monocytes Relative: 12 %
Neutro Abs: 2.9 10*3/uL (ref 1.4–6.5)
Neutrophils Relative %: 65 %
Platelets: 196 10*3/uL (ref 150–440)
RBC: 2.75 MIL/uL — ABNORMAL LOW (ref 3.80–5.20)
RDW: 20.4 % — ABNORMAL HIGH (ref 11.5–14.5)
WBC: 4.4 10*3/uL (ref 3.6–11.0)

## 2016-04-01 LAB — PROTIME-INR
INR: 1.21
Prothrombin Time: 15.4 seconds — ABNORMAL HIGH (ref 11.4–15.2)

## 2016-04-01 LAB — GLUCOSE, CAPILLARY: Glucose-Capillary: 124 mg/dL — ABNORMAL HIGH (ref 65–99)

## 2016-04-01 SURGERY — COLONOSCOPY WITH PROPOFOL
Anesthesia: General

## 2016-04-01 MED ORDER — FENTANYL CITRATE (PF) 100 MCG/2ML IJ SOLN
INTRAMUSCULAR | Status: DC | PRN
  Administered 2016-04-01: 50 ug via INTRAVENOUS

## 2016-04-01 MED ORDER — PHENYLEPHRINE 40 MCG/ML (10ML) SYRINGE FOR IV PUSH (FOR BLOOD PRESSURE SUPPORT)
PREFILLED_SYRINGE | INTRAVENOUS | Status: AC
Start: 2016-04-01 — End: 2016-04-01
  Filled 2016-04-01: qty 10

## 2016-04-01 MED ORDER — MIDAZOLAM HCL 2 MG/2ML IJ SOLN
INTRAMUSCULAR | Status: AC
  Filled 2016-04-01: qty 2

## 2016-04-01 MED ORDER — FENTANYL CITRATE (PF) 100 MCG/2ML IJ SOLN
INTRAMUSCULAR | Status: AC
  Filled 2016-04-01: qty 2

## 2016-04-01 MED ORDER — MIDAZOLAM HCL 5 MG/5ML IJ SOLN
INTRAMUSCULAR | Status: DC | PRN
  Administered 2016-04-01: 1 mg via INTRAVENOUS

## 2016-04-01 MED ORDER — PROPOFOL 10 MG/ML IV BOLUS
INTRAVENOUS | Status: DC | PRN
  Administered 2016-04-01: 100 mg via INTRAVENOUS

## 2016-04-01 MED ORDER — SODIUM CHLORIDE 0.9 % IV SOLN
INTRAVENOUS | Status: DC
  Administered 2016-04-01 (×2): via INTRAVENOUS

## 2016-04-01 MED ORDER — PHENYLEPHRINE HCL 10 MG/ML IJ SOLN
INTRAMUSCULAR | Status: DC | PRN
  Administered 2016-04-01 (×3): 100 ug via INTRAVENOUS

## 2016-04-01 MED ORDER — PROPOFOL 500 MG/50ML IV EMUL
INTRAVENOUS | Status: AC
  Filled 2016-04-01: qty 100

## 2016-04-01 MED ORDER — PROPOFOL 500 MG/50ML IV EMUL
INTRAVENOUS | Status: DC | PRN
  Administered 2016-04-01: 160 ug/kg/min via INTRAVENOUS

## 2016-04-01 MED ORDER — LIDOCAINE 2% (20 MG/ML) 5 ML SYRINGE
INTRAMUSCULAR | Status: DC | PRN
  Administered 2016-04-01: 40 mg via INTRAVENOUS

## 2016-04-01 MED ORDER — SODIUM CHLORIDE 0.9 % IV SOLN: INTRAVENOUS | Status: DC

## 2016-04-01 MED ORDER — LIDOCAINE 2% (20 MG/ML) 5 ML SYRINGE
INTRAMUSCULAR | Status: AC
  Filled 2016-04-01: qty 5

## 2016-04-01 NOTE — Transfer of Care (Signed)
Immediate Anesthesia Transfer of Care Note  Patient: Audrey Garrett  Procedure(s) Performed: Procedure(s): COLONOSCOPY WITH PROPOFOL (N/A) ESOPHAGOGASTRODUODENOSCOPY (EGD) WITH PROPOFOL (N/A)  Patient Location: PACU and Endoscopy Unit  Anesthesia Type:General  Level of Consciousness: awake and patient cooperative  Airway & Oxygen Therapy: Patient Spontanous Breathing and Patient connected to nasal cannula oxygen  Post-op Assessment: Report given to RN and Post -op Vital signs reviewed and stable  Post vital signs: Reviewed and stable  Last Vitals:  Vitals:   04/01/16 0902 04/01/16 1230  BP: (!) 109/56 91/63  Pulse: 79 76  Resp: 14 18  Temp: 37.3 C 36.4 C    Last Pain:  Vitals:   04/01/16 1230  TempSrc: Axillary         Complications: No apparent anesthesia complications

## 2016-04-01 NOTE — H&P (Signed)
Outpatient short stay form Pre-procedure 04/01/2016 11:02 AM Audrey Sails MD  Primary Physician: Dr Ferne Reus  Reason for visit:  EGD and colonoscopy  History of present illness:  Patient is a 56 year old female presenting today as above. She has a history of cirrhosis secondary to alcohol use and hepatitis C. She has discontinued alcohol for close to a year at this point. She has been on harvoni treatment, for about 2 months for her chronic hepatitis C. She has overall been doing well with the exception of recurrent anemia and several episodes over the past couple of months of melena. He is recently admitted at home Hospital EGD was done that was normal. However her stools will be quite black and very positive for heme when this occurs. She has had no nausea vomiting or abdominal pain. Bowel habits are regular and formed. Next over the past couple of weeks she has had normal-appearing bowel movements. She takes no aspirin or NSAIDs.    Current Facility-Administered Medications:  .  0.9 %  sodium chloride infusion, , Intravenous, Continuous, Audrey Sails, MD, Last Rate: 20 mL/hr at 04/01/16 1019 .  0.9 %  sodium chloride infusion, , Intravenous, Continuous, Audrey Sails, MD  Prescriptions Prior to Admission  Medication Sig Dispense Refill Last Dose  . calcium carbonate (TUMS - DOSED IN MG ELEMENTAL CALCIUM) 500 MG chewable tablet Chew 2 tablets by mouth 2 (two) times daily.   Past Week at Unknown time  . furosemide (LASIX) 20 MG tablet Take 20 mg by mouth 2 (two) times daily.   11 04/01/2016 at 0500  . gabapentin (NEURONTIN) 400 MG capsule Take 400 mg by mouth 3 (three) times daily.   2 04/01/2016 at 0500  . lactulose (CHRONULAC) 10 GM/15ML solution Take 10 g by mouth 3 (three) times daily.    03/31/2016 at Unknown time  . Ledipasvir-Sofosbuvir (HARVONI) 90-400 MG TABS Take 1 tablet by mouth daily.   04/01/2016 at 0500  . Magnesium Oxide 250 MG TABS Take 1 tablet by mouth daily.    03/31/2016 at Unknown time  . metFORMIN (GLUCOPHAGE) 500 MG tablet Take 500 mg by mouth 2 (two) times daily with a meal.    03/31/2016 at Unknown time  . omeprazole (PRILOSEC) 20 MG capsule Take 20 mg by mouth daily.   04/01/2016 at 0500  . ondansetron (ZOFRAN-ODT) 4 MG disintegrating tablet Take 4 mg by mouth 2 (two) times daily.     Marland Kitchen PARoxetine (PAXIL) 40 MG tablet Take 40 mg by mouth every morning.  2 04/01/2016 at 0500  . phytonadione (VITAMIN K) 5 MG tablet Take 5 mg by mouth 3 (three) times a week. Mon, Wed, Fri   03/31/2016 at 0500  . spironolactone (ALDACTONE) 100 MG tablet Take 200 mg by mouth 2 (two) times daily.   1 04/01/2016 at 0500  . sucralfate (CARAFATE) 1 GM/10ML suspension Take 1 g by mouth 3 (three) times daily as needed.    03/31/2016 at Unknown time  . thiamine (VITAMIN B-1) 100 MG tablet Take 1 tablet (100 mg total) by mouth daily. 90 tablet 6 Past Week at Unknown time  . ciprofloxacin (CIPRO) 500 MG tablet Take 500 mg by mouth 2 (two) times a week. Monday and friday   03/29/2016  . ferrous sulfate 325 (65 FE) MG EC tablet Take 325 mg by mouth daily with breakfast.   Not Taking at Unknown time  . Multiple Vitamin (MULTIVITAMIN WITH MINERALS) TABS tablet Take 1 tablet by  mouth daily.   Taking  . Oxycodone HCl 20 MG TABS Take 1 tablet (20 mg total) by mouth every 6 (six) hours. 120 tablet 0 Taking  . Oxycodone HCl 20 MG TABS Take 1 tablet (20 mg total) by mouth every 6 (six) hours as needed. 120 tablet 0 Taking  . [START ON 04/27/2016] Oxycodone HCl 20 MG TABS Take 1 tablet (20 mg total) by mouth every 6 (six) hours as needed. 120 tablet 0 Taking  . rOPINIRole (REQUIP) 0.25 MG tablet Take 0.25 mg by mouth at bedtime.   Not Taking at Unknown time     No Known Allergies   Past Medical History:  Diagnosis Date  . Alcohol abuse   . Alcoholic cirrhosis of liver without ascites ([place])   . Anemia   . Anxiety   . Arthropathy   . Back pain   . Bronchitis   . BRONCHITIS, ACUTE 11/01/2009    Qualifier: Diagnosis of  By: Alveta Heimlich MD, Cornelia Copa    . Chronic hepatitis C ([place])   . Chronic LBP 12/25/2014   Overview:  Post extensive surgery   . Cirrhosis ([place])   . DDD (degenerative disc disease), lumbar 01/01/2015  . Depression   . Diabetes mellitus without complication ([place])   . Diverticulosis   . Edema   . Fatigue   . Fibromyalgia   . GERD (gastroesophageal reflux disease)    gastroparesis  . High risk medications (not anticoagulants) long-term use   . Hyperglycemia   . Hypertension   . Kidney mass    11/17 small mass-  . Left leg pain 01/01/2015  . Lumbago   . Lumbar radicular pain 01/01/2015  . Muscle spasm   . Neck pain 01/01/2015  . Polyarthritis   . Reflux   . Restless leg syndrome    01/2016  . RLS (restless legs syndrome)   . Sacroiliac pain 01/01/2015  . Shoulder pain   . Sinusitis   . SOB (shortness of breath)   . Spine disorder   . Stress headaches   . Upper back pain 01/01/2015  . Urinary frequency   . Vitamin D deficiency disease     Review of systems:      Physical Exam    Heart and lungs: Regular rate and rhythm without rub or gallop, lungs are bilaterally clear.    HEENT: Normocephalic atraumatic eyes are anicteric    Other:     Pertinant exam for procedure: Soft nontender nondistended bowel sounds positive normoactive.    Planned proceedures: EGD, colonoscopy and indicated procedures. Labs checked today included a pro time with INR 1.21 and a CBC showing a 7.9 hemoglobin 196 platelets and 4.4 white blood cells her MCV is 89.2. I have discussed the risks benefits and complications of procedures to include not limited to bleeding, infection, perforation and the risk of sedation and the patient wishes to proceed.    Audrey Sails, MD Gastroenterology 04/01/2016  11:02 AM

## 2016-04-01 NOTE — Op Note (Signed)
[facility] Patient Name: Audrey Garrett Procedure Date: 04/01/2016 10:50 AM MRN: [id number] Account #: [id number] Date of Birth: 01-30-1961 Admit Type: Outpatient Age: 56 Room: [facility] ENDO ROOM 1 Gender: Female Note Status: Finalized Procedure:            Colonoscopy Indications:          Heme positive stool, Melena Providers:            Lollie Sails, MD Referring MD:         Venetia Maxon. Elijio Miles, MD (Referring MD) Medicines:            Monitored Anesthesia Care Complications:        No immediate complications. Procedure:            Pre-Anesthesia Assessment:                       - ASA Grade Assessment: III - A patient with severe                        systemic disease.                       After obtaining informed consent, the colonoscope was                        passed under direct vision. Throughout the procedure,                        the patient's blood pressure, pulse, and oxygen                        saturations were monitored continuously. The                        Colonoscope was introduced through the anus and                        advanced to the the ileocecal valve. The colonoscopy                        was performed with moderate difficulty due to                        significant looping. Successful completion of the                        procedure was aided by using manual pressure. The                        patient tolerated the procedure well. The quality of                        the bowel preparation was fair. Findings:      Multiple small-mouthed diverticula were found in the sigmoid colon and       descending colon.      A single small localized angioectasia with typical arborization was       found in the proximal ascending colon. Coagulation for tissue       destruction using argon plasma at 0.5 liters/minute and 20 watts was       successful.  The exam was otherwise normal throughout the examined colon. I  could       only briefly see the cecum due to poor prep.      The digital rectal exam was normal. Impression:           - Preparation of the colon was fair.                       - Diverticulosis in the sigmoid colon and in the                        descending colon.                       - A single colonic angioectasia. Treated with argon                        plasma coagulation (APC).                       - No specimens collected. Recommendation:       - Discharge patient to home.                       - Full liquid diet today, then advance as tolerated to                        advance diet as tolerated. Procedure Code(s):    --- Professional ---                       [phone], Colonoscopy, flexible; with ablation of                        tumor(s), polyp(s), or other lesion(s) (includes pre-                        and post-dilation and guide wire passage, when                        performed) Diagnosis Code(s):    --- Professional ---                       K55.20, Angiodysplasia of colon without hemorrhage                       R19.5, Other fecal abnormalities                       K92.1, Melena (includes Hematochezia)                       K57.30, Diverticulosis of large intestine without                        perforation or abscess without bleeding CPT copyright 2016 American [employer]. All rights reserved. The codes documented in this report are preliminary and upon coder review may  be revised to meet current compliance requirements. Lollie Sails, MD 04/01/2016 1:51:54 PM This report has been signed electronically. Number of Addenda: 0 Note Initiated On: 04/01/2016 10:50 AM      [facility]

## 2016-04-01 NOTE — Op Note (Addendum)
[facility] Patient Name: Audrey Garrett Procedure Date: 04/01/2016 10:51 AM MRN: [id number] Account #: [id number] Date of Birth: 1960/04/03 Admit Type: Outpatient Age: 56 Room: [facility] ENDO ROOM 1 Gender: Female Note Status: Finalized Procedure:            Upper GI endoscopy Indications:          Iron deficiency anemia secondary to chronic blood loss,                        Melena Providers:            Lollie Sails, MD Referring MD:         Venetia Maxon. Elijio Miles, MD (Referring MD) Medicines:            Monitored Anesthesia Care Complications:        No immediate complications. Procedure:            Pre-Anesthesia Assessment:                       - ASA Grade Assessment: III - A patient with severe                        systemic disease.                       After obtaining informed consent, the endoscope was                        passed under direct vision. Throughout the procedure,                        the patient's blood pressure, pulse, and oxygen                        saturations were monitored continuously. The Endoscope                        was introduced through the mouth, and advanced to the                        third part of duodenum. The upper GI endoscopy was                        performed with moderate difficulty due to unusual                        anatomy. The patient tolerated the procedure well. Findings:      The examined esophagus was normal.      Patient has a prominant cricopharyngus the makes intubation more       difficult.      Multiple dispersed diminutive erosions were found in the gastric antrum.       There were no stigmata of recent bleeding.      One healing cratered gastric ulcer with no stigmata of bleeding was       found in the prepyloric region of the stomach. The lesion was 2 mm in       largest dimension.      Biopsies were taken with a cold forceps in the gastric body and in the       gastric  antrum for Helicobacter  pylori testing.      The cardia and gastric fundus were normal on retroflexion.      The examined duodenum was normal. Impression:           - Normal esophagus.                       - Erosive gastropathy.                       - Healing gastric ulcer with no stigmata of bleeding.                       - Normal examined duodenum.                       - Biopsies were taken with a cold forceps for                        Helicobacter pylori testing. Recommendation:       - Advance diet as tolerated.                       - Use sucralfate tablets 1 gram PO QID for 4 weeks.                       - Use sucralfate tablets 1 gram PO BID for 1 month. Procedure Code(s):    --- Professional ---                       [phone], Esophagogastroduodenoscopy, flexible, transoral;                        with biopsy, single or multiple CPT copyright 2016 American Medical Association. All rights reserved. The codes documented in this report are preliminary and upon coder review may  be revised to meet current compliance requirements. Lollie Sails, MD 04/01/2016 12:42:00 PM This report has been signed electronically. Number of Addenda: 0 Note Initiated On: 04/01/2016 10:51 AM Scope Withdrawal Time: 0 hours 9 minutes 47 seconds  Total Procedure Duration: 0 hours 37 minutes 23 seconds       [facility]

## 2016-04-01 NOTE — Anesthesia Postprocedure Evaluation (Signed)
Anesthesia Post Note  Patient: Audrey Garrett  Procedure(s) Performed: Procedure(s) (LRB): COLONOSCOPY WITH PROPOFOL (N/A) ESOPHAGOGASTRODUODENOSCOPY (EGD) WITH PROPOFOL (N/A)  Patient location during evaluation: Endoscopy Anesthesia Type: General Level of consciousness: awake and alert and oriented Pain management: pain level controlled Vital Signs Assessment: post-procedure vital signs reviewed and stable Respiratory status: spontaneous breathing, nonlabored ventilation and respiratory function stable Cardiovascular status: blood pressure returned to baseline and stable Postop Assessment: no signs of nausea or vomiting Anesthetic complications: no     Last Vitals:  Vitals:   04/01/16 1305 04/01/16 1315  BP: 94/65 95/66  Pulse: 74 77  Resp: 15 15  Temp:      Last Pain:  Vitals:   04/01/16 1253  TempSrc:   PainSc: 0-No pain                 Bluma Buresh

## 2016-04-01 NOTE — Anesthesia Preprocedure Evaluation (Signed)
Anesthesia Evaluation  Patient identified by MRN, date of birth, ID band Patient awake    Reviewed: Allergy & Precautions, NPO status , [name] Chart, lab work & pertinent test results  History of Anesthesia Complications Negative for: history of anesthetic complications  Airway Mallampati: II  TM Distance: >3 FB Neck ROM: Full    Dental  (+) Upper Dentures, Lower Dentures   Pulmonary neg sleep apnea, neg COPD, Current Smoker,    breath sounds clear to auscultation- rhonchi (-) wheezing      Cardiovascular Exercise Tolerance: Good hypertension, Pt. on medications (-) CAD and (-) Past MI  Rhythm:Regular Rate:Normal - Systolic murmurs and - Diastolic murmurs    Neuro/Psych Anxiety Depression negative neurological ROS     GI/Hepatic GERD  ,(+) Cirrhosis       , Hepatitis -, C  Endo/Other  diabetes, Type 2, Oral Hypoglycemic Agents  Renal/GU Renal disease: hx of nephrolithiasis.     Musculoskeletal  (+) Arthritis , Fibromyalgia -  Abdominal (+) - obese,   Peds  Hematology  (+) anemia ,   Anesthesia Other Findings Past Medical History: No date: Alcohol abuse No date: Alcoholic cirrhosis of liver without ascites (* No date: Anemia No date: Anxiety No date: Arthropathy No date: Back pain No date: Bronchitis 11/01/2009: BRONCHITIS, ACUTE     Comment: Qualifier: Diagnosis of  By: Alveta Heimlich MD, Cornelia Copa   No date: Chronic hepatitis C ([place]) 12/25/2014: Chronic LBP     Comment: Overview:  Post extensive surgery  No date: Cirrhosis ([place]) 01/01/2015: DDD (degenerative disc disease), lumbar No date: Depression No date: Diabetes mellitus without complication (HCC) No date: Diverticulosis No date: Edema No date: Fatigue No date: Fibromyalgia No date: GERD (gastroesophageal reflux disease)     Comment: gastroparesis No date: High risk medications (not anticoagulants) lon* No date: Hyperglycemia No date: Hypertension No  date: Kidney mass     Comment: 11/17 small mass- 01/01/2015: Left leg pain No date: Lumbago 01/01/2015: Lumbar radicular pain No date: Muscle spasm 01/01/2015: Neck pain No date: Polyarthritis No date: Reflux No date: Restless leg syndrome     Comment: 01/2016 No date: RLS (restless legs syndrome) 01/01/2015: Sacroiliac pain No date: Shoulder pain No date: Sinusitis No date: SOB (shortness of breath) No date: Spine disorder No date: Stress headaches 01/01/2015: Upper back pain No date: Urinary frequency No date: Vitamin D deficiency disease   Reproductive/Obstetrics                             Anesthesia Physical Anesthesia Plan  ASA: III  Anesthesia Plan: General   Post-op Pain Management:    Induction: Intravenous  Airway Management Planned: Natural Airway  Additional Equipment:   Intra-op Plan:   Post-operative Plan:   Informed Consent: I have reviewed the patients History and Physical, chart, labs and discussed the procedure including the risks, benefits and alternatives for the proposed anesthesia with the patient or authorized representative who has indicated his/her understanding and acceptance.   Dental advisory given  Plan Discussed with: CRNA and Anesthesiologist  Anesthesia Plan Comments:         Anesthesia Quick Evaluation

## 2016-04-02 LAB — SURGICAL PATHOLOGY

## 2016-04-07 ENCOUNTER — Inpatient Hospital Stay: Payer: Medicare Other | Attending: Hematology and Oncology | Admitting: Hematology and Oncology

## 2016-04-07 ENCOUNTER — Encounter: Payer: Self-pay | Admitting: Hematology and Oncology

## 2016-04-07 ENCOUNTER — Other Ambulatory Visit: Payer: Self-pay | Admitting: Hematology and Oncology

## 2016-04-07 ENCOUNTER — Inpatient Hospital Stay: Payer: Medicare Other

## 2016-04-07 ENCOUNTER — Other Ambulatory Visit: Payer: Self-pay | Admitting: *Deleted

## 2016-04-07 VITALS — BP 112/73 | HR 72 | Temp 97.1°F | Resp 18 | Ht 70.0 in | Wt 154.3 lb

## 2016-04-07 DIAGNOSIS — Z79899 Other long term (current) drug therapy: Secondary | ICD-10-CM

## 2016-04-07 DIAGNOSIS — M5116 Intervertebral disc disorders with radiculopathy, lumbar region: Secondary | ICD-10-CM | POA: Insufficient documentation

## 2016-04-07 DIAGNOSIS — Z7984 Long term (current) use of oral hypoglycemic drugs: Secondary | ICD-10-CM | POA: Insufficient documentation

## 2016-04-07 DIAGNOSIS — B182 Chronic viral hepatitis C: Secondary | ICD-10-CM | POA: Insufficient documentation

## 2016-04-07 DIAGNOSIS — E559 Vitamin D deficiency, unspecified: Secondary | ICD-10-CM | POA: Diagnosis not present

## 2016-04-07 DIAGNOSIS — R600 Localized edema: Secondary | ICD-10-CM

## 2016-04-07 DIAGNOSIS — K7460 Unspecified cirrhosis of liver: Secondary | ICD-10-CM

## 2016-04-07 DIAGNOSIS — R188 Other ascites: Secondary | ICD-10-CM

## 2016-04-07 DIAGNOSIS — I10 Essential (primary) hypertension: Secondary | ICD-10-CM | POA: Insufficient documentation

## 2016-04-07 DIAGNOSIS — E119 Type 2 diabetes mellitus without complications: Secondary | ICD-10-CM | POA: Diagnosis not present

## 2016-04-07 DIAGNOSIS — K259 Gastric ulcer, unspecified as acute or chronic, without hemorrhage or perforation: Secondary | ICD-10-CM | POA: Diagnosis not present

## 2016-04-07 DIAGNOSIS — D62 Acute posthemorrhagic anemia: Secondary | ICD-10-CM

## 2016-04-07 DIAGNOSIS — K5730 Diverticulosis of large intestine without perforation or abscess without bleeding: Secondary | ICD-10-CM

## 2016-04-07 DIAGNOSIS — M797 Fibromyalgia: Secondary | ICD-10-CM | POA: Insufficient documentation

## 2016-04-07 DIAGNOSIS — F329 Major depressive disorder, single episode, unspecified: Secondary | ICD-10-CM | POA: Insufficient documentation

## 2016-04-07 DIAGNOSIS — G2581 Restless legs syndrome: Secondary | ICD-10-CM | POA: Insufficient documentation

## 2016-04-07 DIAGNOSIS — K219 Gastro-esophageal reflux disease without esophagitis: Secondary | ICD-10-CM | POA: Insufficient documentation

## 2016-04-07 DIAGNOSIS — F419 Anxiety disorder, unspecified: Secondary | ICD-10-CM | POA: Diagnosis not present

## 2016-04-07 DIAGNOSIS — D500 Iron deficiency anemia secondary to blood loss (chronic): Secondary | ICD-10-CM

## 2016-04-07 LAB — CBC WITH DIFFERENTIAL/PLATELET
Basophils Absolute: 0 10*3/uL (ref 0–0.1)
Basophils Relative: 1 %
Eosinophils Absolute: 0.1 10*3/uL (ref 0–0.7)
Eosinophils Relative: 2 %
HCT: 24.9 % — ABNORMAL LOW (ref 35.0–47.0)
Hemoglobin: 8 g/dL — ABNORMAL LOW (ref 12.0–16.0)
Lymphocytes Relative: 18 %
Lymphs Abs: 0.9 10*3/uL — ABNORMAL LOW (ref 1.0–3.6)
MCH: 28.1 pg (ref 26.0–34.0)
MCHC: 32 g/dL (ref 32.0–36.0)
MCV: 87.7 fL (ref 80.0–100.0)
Monocytes Absolute: 0.7 10*3/uL (ref 0.2–0.9)
Monocytes Relative: 14 %
Neutro Abs: 3.2 10*3/uL (ref 1.4–6.5)
Neutrophils Relative %: 65 %
Platelets: 166 10*3/uL (ref 150–440)
RBC: 2.84 MIL/uL — ABNORMAL LOW (ref 3.80–5.20)
RDW: 19.8 % — ABNORMAL HIGH (ref 11.5–14.5)
WBC: 4.9 10*3/uL (ref 3.6–11.0)

## 2016-04-07 LAB — FERRITIN: Ferritin: 13 ng/mL (ref 11–307)

## 2016-04-07 LAB — SAMPLE TO BLOOD BANK

## 2016-04-07 LAB — PREPARE RBC (CROSSMATCH)

## 2016-04-07 MED ORDER — SODIUM CHLORIDE 0.9% FLUSH
3.0000 mL | INTRAVENOUS | Status: DC | PRN
  Filled 2016-04-07: qty 3

## 2016-04-07 MED ORDER — HEPARIN SOD (PORK) LOCK FLUSH 100 UNIT/ML IV SOLN: 500.0000 [IU] | Freq: Every day | INTRAVENOUS | Status: DC | PRN

## 2016-04-07 MED ORDER — DIPHENHYDRAMINE HCL 25 MG PO CAPS: 25.0000 mg | ORAL_CAPSULE | Freq: Once | ORAL | Status: DC

## 2016-04-07 MED ORDER — HEPARIN SOD (PORK) LOCK FLUSH 100 UNIT/ML IV SOLN: 250.0000 [IU] | INTRAVENOUS | Status: DC | PRN

## 2016-04-07 MED ORDER — SODIUM CHLORIDE 0.9% FLUSH
10.0000 mL | INTRAVENOUS | Status: DC | PRN
  Filled 2016-04-07: qty 10

## 2016-04-07 MED ORDER — SODIUM CHLORIDE 0.9 % IV SOLN
250.0000 mL | Freq: Once | INTRAVENOUS | Status: DC
  Filled 2016-04-07: qty 250

## 2016-04-07 MED ORDER — ACETAMINOPHEN 325 MG PO TABS: 650.0000 mg | ORAL_TABLET | Freq: Once | ORAL | Status: DC

## 2016-04-07 NOTE — Assessment & Plan Note (Signed)
She has liver cirrhosis due to hepatitis C and is receiving treatment She has abdominal ascites Continue current treatment

## 2016-04-07 NOTE — Progress Notes (Signed)
[place] progress notes  Patient Care Team: Jodi Marble, MD as PCP - General (Internal Medicine)  CHIEF COMPLAINTS/PURPOSE OF VISIT:  Severe iron deficiency anemia  HISTORY OF PRESENTING ILLNESS:  Audrey Garrett 56 y.o. female was transferred to my care after her prior physician has left.  I reviewed the patient's records extensive and collaborated the history with the patient. Summary of her history is as follows: This patient has liver cirrhosis and is currently receiving Harvoni for hepatitis C. She has significant GI evaluation. Upper EGD on 04/01/16 showed multiple dispersed diminutive erosions were found in the gastric antrum. There were no stigmata of recent bleeding. One healing cratered gastric ulcer with no stigmata of bleeding was found in the prepyloric region of the stomach. The lesion was 2 mm in largest dimension. Colonoscopy on 04/01/16 showed multiple small-mouthed diverticula were found in the sigmoid colon and descending colon. A single small localized angioectasia with typical arborization was found in the proximal ascending colon. Coagulation for tissue destruction using argon plasma at 0.5 liters/minute and 20 watts was successful.  She has received significant blood transfusions recently since March 2017. Her last blood transfusion was on 03/17/16. She had 2 units of blood She received 4 doses of IV Venofer  She complained of profound fatigue, leg cramp, headaches and generalized weakness. She complained of pica with excessive chewing on ice The patient denies any recent signs or symptoms of bleeding such as spontaneous epistaxis, hematuria or hematochezia. She complained abdominal bloating and swelling and bilateral leg edema. She has diffuse skin itching Denies recent infection. She tolerated Harvoni well  MEDICAL HISTORY:  Past Medical History:  Diagnosis Date  . Alcohol abuse   . Alcoholic cirrhosis of liver without ascites ([place])    . Anemia   . Anxiety   . Arthropathy   . Back pain   . Bronchitis   . BRONCHITIS, ACUTE 11/01/2009   Qualifier: Diagnosis of  By: Alveta Heimlich MD, Cornelia Copa    . Chronic hepatitis C ([place])   . Chronic LBP 12/25/2014   Overview:  Post extensive surgery   . Cirrhosis ([place])   . DDD (degenerative disc disease), lumbar 01/01/2015  . Depression   . Diabetes mellitus without complication ([place])   . Diverticulosis   . Edema   . Fatigue   . Fibromyalgia   . GERD (gastroesophageal reflux disease)    gastroparesis  . High risk medications (not anticoagulants) long-term use   . Hyperglycemia   . Hypertension   . Kidney mass    11/17 small mass-  . Left leg pain 01/01/2015  . Lumbago   . Lumbar radicular pain 01/01/2015  . Muscle spasm   . Neck pain 01/01/2015  . Polyarthritis   . Reflux   . Restless leg syndrome    01/2016  . RLS (restless legs syndrome)   . Sacroiliac pain 01/01/2015  . Shoulder pain   . Sinusitis   . SOB (shortness of breath)   . Spine disorder   . Stress headaches   . Upper back pain 01/01/2015  . Urinary frequency   . Vitamin D deficiency disease     SURGICAL HISTORY: Past Surgical History:  Procedure Laterality Date  . BACK SURGERY  12/19/2011  . BACK SURGERY    . COLONOSCOPY WITH PROPOFOL N/A 09/25/2015   Procedure: COLONOSCOPY WITH PROPOFOL;  Surgeon: Lollie Sails, MD;  Location: [facility] ENDOSCOPY;  Service: Endoscopy;  Laterality: N/A;  . COLONOSCOPY WITH PROPOFOL N/A  04/01/2016   Procedure: COLONOSCOPY WITH PROPOFOL;  Surgeon: Lollie Sails, MD;  Location: [facility] ENDOSCOPY;  Service: Endoscopy;  Laterality: N/A;  . ESOPHAGOGASTRODUODENOSCOPY N/A 04/14/2015   Procedure: ESOPHAGOGASTRODUODENOSCOPY (EGD);  Surgeon: Lollie Sails, MD;  Location: [facility] ENDOSCOPY;  Service: Endoscopy;  Laterality: N/A;  . ESOPHAGOGASTRODUODENOSCOPY (EGD) WITH PROPOFOL N/A 09/16/2015   Procedure: ESOPHAGOGASTRODUODENOSCOPY (EGD) WITH PROPOFOL;  Surgeon: Lollie Sails, MD;  Location:  [facility] ENDOSCOPY;  Service: Endoscopy;  Laterality: N/A;  . ESOPHAGOGASTRODUODENOSCOPY (EGD) WITH PROPOFOL N/A 01/22/2016   Procedure: ESOPHAGOGASTRODUODENOSCOPY (EGD) WITH PROPOFOL;  Surgeon: [name], MD;  Location: [place];  Service: Endoscopy;  Laterality: N/A;  . ESOPHAGOGASTRODUODENOSCOPY (EGD) WITH PROPOFOL N/A 04/01/2016   Procedure: ESOPHAGOGASTRODUODENOSCOPY (EGD) WITH PROPOFOL;  Surgeon: Lollie Sails, MD;  Location: [facility] ENDOSCOPY;  Service: Endoscopy;  Laterality: N/A;  . HERNIA REPAIR N/A 10/2015   abdominal   . TONSILLECTOMY    . TUBAL LIGATION    . UMBILICAL HERNIA REPAIR N/A 11/06/2015   Procedure: HERNIA REPAIR UMBILICAL ADULT;  Surgeon: [name], MD;  Location: [facility] ORS;  Service: General;  Laterality: N/A;    SOCIAL HISTORY: Social History   Social History  . Marital status: Married    Spouse name: N/A  . Number of children: N/A  . Years of education: N/A   Occupational History  . Not on file.   Social History Main Topics  . Smoking status: Current Every Day Smoker    Packs/day: 0.50    Years: 40.00    Types: Cigarettes  . Smokeless tobacco: Never Used  . Alcohol use No     Comment: stopped drinking 05/12/15  . Drug use: No  . Sexual activity: Not on file   Other Topics Concern  . Not on file   Social History Narrative   Lives at home with husband    FAMILY HISTORY: Family History  Problem Relation Age of Onset  . Stroke Mother   . Heart disease Mother   . [name] Father   . Breast cancer Sister 69  . Kidney disease Neg Hx   . Bladder Cancer Neg Hx   . Kidney cancer Neg Hx   . Prostate cancer Neg Hx     ALLERGIES:  has No Known Allergies.  MEDICATIONS:  Current Outpatient Prescriptions  Medication Sig Dispense Refill  . calcium carbonate (TUMS - DOSED IN MG ELEMENTAL CALCIUM) 500 MG chewable tablet Chew 2 tablets by mouth 2 (two) times daily.    . ciprofloxacin (CIPRO) 500 MG tablet Take 500 mg by mouth 2 (two)  times a week. Monday and friday    . furosemide (LASIX) 20 MG tablet Take 20 mg by mouth 2 (two) times daily.   11  . gabapentin (NEURONTIN) 400 MG capsule Take 400 mg by mouth 3 (three) times daily.   2  . lactulose (CHRONULAC) 10 GM/15ML solution Take 10 g by mouth 3 (three) times daily.     . Ledipasvir-Sofosbuvir (HARVONI) 90-400 MG TABS Take 1 tablet by mouth daily.    . Magnesium Oxide 250 MG TABS Take 1 tablet by mouth daily.    . metFORMIN (GLUCOPHAGE) 500 MG tablet Take 500 mg by mouth 2 (two) times daily with a meal.     . Multiple Vitamin (MULTIVITAMIN WITH MINERALS) TABS tablet Take 1 tablet by mouth daily.    Marland Kitchen omeprazole (PRILOSEC) 20 MG capsule Take 20 mg by mouth daily.    . ondansetron (ZOFRAN-ODT) 4 MG disintegrating  tablet Take 4 mg by mouth 2 (two) times daily.    . Oxycodone HCl 20 MG TABS Take 1 tablet (20 mg total) by mouth every 6 (six) hours as needed. 120 tablet 0  . [START ON 04/27/2016] Oxycodone HCl 20 MG TABS Take 1 tablet (20 mg total) by mouth every 6 (six) hours as needed. 120 tablet 0  . PARoxetine (PAXIL) 40 MG tablet Take 40 mg by mouth every morning.  2  . phytonadione (VITAMIN K) 5 MG tablet Take 5 mg by mouth 3 (three) times a week. Mon, Wed, Fri    . spironolactone (ALDACTONE) 100 MG tablet Take 200 mg by mouth 2 (two) times daily.   1  . sucralfate (CARAFATE) 1 GM/10ML suspension Take 1 g by mouth 3 (three) times daily as needed.     . thiamine (VITAMIN B-1) 100 MG tablet Take 1 tablet (100 mg total) by mouth daily. 90 tablet 6  . Oxycodone HCl 20 MG TABS Take 1 tablet (20 mg total) by mouth every 6 (six) hours. 120 tablet 0   Current Facility-Administered Medications  Medication Dose Route Frequency Provider Last Rate Last Dose  . 0.9 %  sodium chloride infusion  250 mL Intravenous Once Heath Lark, MD      . acetaminophen (TYLENOL) tablet 650 mg  650 mg Oral Once Heath Lark, MD      . diphenhydrAMINE (BENADRYL) capsule 25 mg  25 mg Oral Once Heath Lark,  MD      . heparin lock flush 100 unit/mL  500 Units Intracatheter Daily PRN Heath Lark, MD      . heparin lock flush 100 unit/mL  250 Units Intracatheter PRN Heath Lark, MD      . sodium chloride flush (NS) 0.9 % injection 10 mL  10 mL Intracatheter PRN Heath Lark, MD      . sodium chloride flush (NS) 0.9 % injection 3 mL  3 mL Intracatheter PRN Heath Lark, MD        REVIEW OF SYSTEMS:   Constitutional: Denies fevers, chills or abnormal night sweats Eyes: Denies blurriness of vision, double vision or watery eyes Ears, nose, mouth, throat, and face: Denies mucositis or sore throat Gastrointestinal:  Denies nausea, heartburn or change in bowel habits Skin: Denies abnormal skin rashes Lymphatics: Denies new lymphadenopathy or easy bruising Neurological:Denies numbness, tingling  Behavioral/Psych: Mood is stable, no new changes  All other systems were reviewed with the patient and are negative.  PHYSICAL EXAMINATION: ECOG PERFORMANCE STATUS: 2 - Symptomatic, <50% confined to bed  Vitals:   04/07/16 1058  BP: 112/73  Pulse: 72  Resp: 18  Temp: 97.1 F (36.2 C)   Filed Weights   04/07/16 1058  Weight: 154 lb 5.2 oz (70 kg)    GENERAL:alert, no distress and comfortable SKIN: noted mild skin rash from scratching. Noted spider nevi EYES: normal, conjunctiva are pink and non-injected, sclera clear OROPHARYNX:no exudate, normal lips, buccal mucosa, and tongue  NECK: supple, thyroid normal size, non-tender, without nodularity LYMPH:  no palpable lymphadenopathy in the cervical, axillary or inguinal LUNGS: clear to auscultation and percussion with normal breathing effort HEART: regular rate & rhythm and no murmurs with moderate bilateral lower extremity edema ABDOMEN:abdomen soft, non-tender and normal bowel sounds. Distended with ascites Musculoskeletal:no cyanosis of digits and no clubbing  PSYCH: alert & oriented x 3 with fluent speech NEURO: no focal motor/sensory  deficits  LABORATORY DATA:  I have reviewed the data as listed Lab Results  Component Value Date   WBC 4.9 04/07/2016   HGB 8.0 (L) 04/07/2016   HCT 24.9 (L) 04/07/2016   MCV 87.7 04/07/2016   PLT 166 04/07/2016    Recent Labs  09/30/15 1304  11/07/15 0601  01/20/16 1726 01/21/16 0404 01/22/16 0223  NA 130*  < > 131*  < > 129* 131* 135  K 3.6  < > 4.2  < > 4.7 4.1 4.2  CL 97*  < > 97*  < > 99* 101 106  CO2 27  < > 28  < > 20* 22 23  GLUCOSE 106*  < > 145*  < > 246* 113* 106*  BUN 7  < > 7  < > 21* 14 8  CREATININE 0.60  < > 0.56  < > 0.78 0.66 0.54  CALCIUM 8.3*  < > 8.6*  < > 9.3 8.4* 8.2*  GFRNONAA >60  < > >60  < > >60 >60 >60  GFRAA >60  < > >60  < > >60 >60 >60  PROT 5.8*  < > 6.6  --  7.2 5.7*  --   ALBUMIN 2.8*  < > 3.0*  --  3.5 2.9*  --   AST 30  < > 25  --  33 23  --   ALT 19  < > 14  --  14 13*  --   ALKPHOS 76  < > 64  --  51 43  --   BILITOT 0.7  < > 0.6  --  0.6 0.9  --   BILIDIR 0.2  --   --   --   --   --   --   IBILI 0.5  --   --   --   --   --   --   < > = values in this interval not displayed.  ASSESSMENT & PLAN:  Acute blood loss anemia Most likely cause is related to chronic GI bleed She has significant symptoms of anemia  We discussed some of the risks, benefits, and alternatives of blood transfusions. The patient is symptomatic from anemia and the hemoglobin level is critically low.  Some of the side-effects to be expected including risks of transfusion reactions, chills, infection, syndrome of volume overload and risk of hospitalization from various reasons and the patient is willing to proceed and went ahead to sign consent today.  I will transfuse her with 2 units of blood and IV iron next week I plan to bring her back within a month for close follow-up   Iron deficiency anemia due to chronic blood loss Recent GI evaluation revealed ulcers and stomach erosions Despite 4 doses of IV Venofer, she has persistent iron deficiency, likely due  to GI bleed I recommend blood transfusion first followed by IV iron I will switch her to IV feraheme for less frequent dosing The most likely cause of her anemia is due to chronic blood loss/malabsorption syndrome. We discussed some of the risks, benefits, and alternatives of intravenous iron infusions. The patient is symptomatic from anemia and the iron level is critically low. She tolerated oral iron supplement poorly and desires to achieved higher levels of iron faster for adequate hematopoesis. Some of the side-effects to be expected including risks of infusion reactions, phlebitis, headaches, nausea and fatigue.  The patient is willing to proceed. Patient education material was dispensed.  Goal is to keep ferritin level greater than 50   Hepatic cirrhosis ([place]) She has liver cirrhosis due to  hepatitis C and is receiving treatment She has abdominal ascites Continue current treatment  Bilateral leg edema Related to liver disease I recommend avoid extra salt intake, leg elevation and compression hose daily   Orders Placed This Encounter  Procedures  . CBC with Differential/Platelet    Standing Status:   Future    Standing Expiration Date:   10/05/2017  . Ferritin    Standing Status:   Future    Standing Expiration Date:   10/05/2017  . Practitioner attestation of consent    I, the ordering practitioner, attest that I have discussed with the patient the benefits, risks, side effects, alternatives, likelihood of achieving goals and potential problems during recovery for the procedure listed.    Standing Status:   Future    Standing Expiration Date:   04/07/2017    Order Specific Question:   Procedure    Answer:   Blood Product(s)  . Complete patient signature process for consent form    Standing Status:   Future    Standing Expiration Date:   04/07/2017  . Care order/instruction    Transfuse Parameters    Standing Status:   Future    Standing Expiration Date:   04/07/2017  . Type  and screen    Standing Status:   Future    Number of Occurrences:   1    Standing Expiration Date:   04/07/2017  . Prepare RBC    Standing Status:   Standing    Number of Occurrences:   1    Order Specific Question:   # of Units    Answer:   2 units    Order Specific Question:   Transfusion Indications    Answer:   Symptomatic Anemia    Order Specific Question:   If emergent release call blood bank    Answer:   Not emergent release  . Hold Tube- Blood Bank    Standing Status:   Future    Standing Expiration Date:   10/05/2017    All questions were answered. The patient knows to call the clinic with any problems, questions or concerns. I spent 30 minutes counseling the patient face to face. The total time spent in the appointment was 40 minutes and more than 50% was on counseling.     Heath Lark, MD 04/07/2016 12:48 PM

## 2016-04-07 NOTE — Assessment & Plan Note (Signed)
Most likely cause is related to chronic GI bleed She has significant symptoms of anemia  We discussed some of the risks, benefits, and alternatives of blood transfusions. The patient is symptomatic from anemia and the hemoglobin level is critically low.  Some of the side-effects to be expected including risks of transfusion reactions, chills, infection, syndrome of volume overload and risk of hospitalization from various reasons and the patient is willing to proceed and went ahead to sign consent today.  I will transfuse her with 2 units of blood and IV iron next week I plan to bring her back within a month for close follow-up

## 2016-04-07 NOTE — Assessment & Plan Note (Signed)
Related to liver disease I recommend avoid extra salt intake, leg elevation and compression hose daily

## 2016-04-07 NOTE — Assessment & Plan Note (Signed)
Recent GI evaluation revealed ulcers and stomach erosions Despite 4 doses of IV Venofer, she has persistent iron deficiency, likely due to GI bleed I recommend blood transfusion first followed by IV iron I will switch her to IV feraheme for less frequent dosing The most likely cause of her anemia is due to chronic blood loss/malabsorption syndrome. We discussed some of the risks, benefits, and alternatives of intravenous iron infusions. The patient is symptomatic from anemia and the iron level is critically low. She tolerated oral iron supplement poorly and desires to achieved higher levels of iron faster for adequate hematopoesis. Some of the side-effects to be expected including risks of infusion reactions, phlebitis, headaches, nausea and fatigue.  The patient is willing to proceed. Patient education material was dispensed.  Goal is to keep ferritin level greater than 50

## 2016-04-07 NOTE — Progress Notes (Signed)
Pt complains of swelling ob both legs more on the left than right.  She is tired.

## 2016-04-08 ENCOUNTER — Inpatient Hospital Stay: Payer: Medicare Other

## 2016-04-08 DIAGNOSIS — D500 Iron deficiency anemia secondary to blood loss (chronic): Secondary | ICD-10-CM | POA: Diagnosis not present

## 2016-04-08 DIAGNOSIS — D62 Acute posthemorrhagic anemia: Secondary | ICD-10-CM

## 2016-04-08 MED ORDER — SODIUM CHLORIDE 0.9 % IV SOLN
250.0000 mL | Freq: Once | INTRAVENOUS | Status: AC
  Administered 2016-04-08: 250 mL via INTRAVENOUS
  Filled 2016-04-08: qty 250

## 2016-04-08 MED ORDER — ACETAMINOPHEN 325 MG PO TABS
650.0000 mg | ORAL_TABLET | Freq: Once | ORAL | Status: DC
  Filled 2016-04-08: qty 2

## 2016-04-08 MED ORDER — DIPHENHYDRAMINE HCL 25 MG PO CAPS
25.0000 mg | ORAL_CAPSULE | Freq: Once | ORAL | Status: AC
  Administered 2016-04-08: 25 mg via ORAL
  Filled 2016-04-08: qty 1

## 2016-04-09 LAB — TYPE AND SCREEN
Blood Product Expiration Date: 201801232359
Blood Product Expiration Date: 201801262359
ISSUE DATE / TIME: 201801111005
ISSUE DATE / TIME: 201801111208
Unit Type and Rh: 5100
Unit Type and Rh: 5100

## 2016-04-12 ENCOUNTER — Ambulatory Visit
Admission: RE | Admit: 2016-04-12 | Discharge: 2016-04-12 | Disposition: A | Discharge status: Home / Self Care | Payer: Medicare Other | Source: Ambulatory Visit | Attending: Gastroenterology | Admitting: Gastroenterology

## 2016-04-12 ENCOUNTER — Other Ambulatory Visit: Payer: Self-pay | Admitting: Gastroenterology

## 2016-04-12 ENCOUNTER — Other Ambulatory Visit
Admission: RE | Admit: 2016-04-12 | Discharge: 2016-04-12 | Disposition: A | Discharge status: Home / Self Care | Payer: Medicare Other | Source: Ambulatory Visit | Attending: Gastroenterology | Admitting: Gastroenterology

## 2016-04-12 DIAGNOSIS — K7031 Alcoholic cirrhosis of liver with ascites: Secondary | ICD-10-CM | POA: Insufficient documentation

## 2016-04-12 DIAGNOSIS — R140 Abdominal distension (gaseous): Secondary | ICD-10-CM

## 2016-04-12 DIAGNOSIS — K659 Peritonitis, unspecified: Secondary | ICD-10-CM | POA: Diagnosis not present

## 2016-04-12 LAB — BODY FLUID CELL COUNT WITH DIFFERENTIAL
Eos, Fluid: 0 %
Lymphs, Fluid: 44 %
Monocyte-Macrophage-Serous Fluid: 34 %
Neutrophil Count, Fluid: 22 %
Other Cells, Fluid: 0 %
Total Nucleated Cell Count, Fluid: 318 cu mm

## 2016-04-12 LAB — DIFFERENTIAL
Basophils Absolute: 0 10*3/uL (ref 0–0.1)
Basophils Relative: 0 %
Eosinophils Absolute: 0.1 10*3/uL (ref 0–0.7)
Eosinophils Relative: 2 %
Lymphocytes Relative: 20 %
Lymphs Abs: 1 10*3/uL (ref 1.0–3.6)
Monocytes Absolute: 0.5 10*3/uL (ref 0.2–0.9)
Monocytes Relative: 11 %
Neutro Abs: 3.4 10*3/uL (ref 1.4–6.5)
Neutrophils Relative %: 67 %

## 2016-04-12 LAB — LACTATE DEHYDROGENASE, PLEURAL OR PERITONEAL FLUID: LD, Fluid: 51 U/L — ABNORMAL HIGH (ref 3–23)

## 2016-04-12 LAB — ALBUMIN, FLUID (OTHER): Albumin, Fluid: 2.1 g/dL

## 2016-04-12 LAB — COMPREHENSIVE METABOLIC PANEL
ALT: 9 U/L — ABNORMAL LOW (ref 14–54)
AST: 20 U/L (ref 15–41)
Albumin: 3.5 g/dL (ref 3.5–5.0)
Alkaline Phosphatase: 59 U/L (ref 38–126)
Anion gap: 7 (ref 5–15)
BUN: 7 mg/dL (ref 6–20)
CO2: 26 mmol/L (ref 22–32)
Calcium: 9.3 mg/dL (ref 8.9–10.3)
Chloride: 97 mmol/L — ABNORMAL LOW (ref 101–111)
Creatinine, Ser: 0.69 mg/dL (ref 0.44–1.00)
GFR calc Af Amer: >60 mL/min (ref >60)
GFR calc non Af Amer: >60 mL/min (ref >60)
Glucose, Bld: 101 mg/dL — ABNORMAL HIGH (ref 65–99)
Potassium: 3.4 mmol/L — ABNORMAL LOW (ref 3.5–5.1)
Sodium: 130 mmol/L — ABNORMAL LOW (ref 135–145)
Total Bilirubin: 0.7 mg/dL (ref 0.3–1.2)
Total Protein: 7.3 g/dL (ref 6.5–8.1)

## 2016-04-12 LAB — GLUCOSE, SEROUS FLUID: Glucose, Fluid: 128 mg/dL

## 2016-04-12 LAB — CBC
HCT: 31.6 % — ABNORMAL LOW (ref 35.0–47.0)
Hemoglobin: 10.3 g/dL — ABNORMAL LOW (ref 12.0–16.0)
MCH: 28.3 pg (ref 26.0–34.0)
MCHC: 32.5 g/dL (ref 32.0–36.0)
MCV: 87.2 fL (ref 80.0–100.0)
Platelets: 163 10*3/uL (ref 150–440)
RBC: 3.62 MIL/uL — ABNORMAL LOW (ref 3.80–5.20)
RDW: 18.5 % — ABNORMAL HIGH (ref 11.5–14.5)
WBC: 5 10*3/uL (ref 3.6–11.0)

## 2016-04-12 LAB — PROTEIN, BODY FLUID: Total protein, fluid: 3.6 g/dL

## 2016-04-12 MED ORDER — ALBUMIN HUMAN 25 % IV SOLN
25.0000 g | Freq: Once | INTRAVENOUS | Status: DC
Start: 2016-04-12 — End: 2016-04-12
  Filled 2016-04-12: qty 100

## 2016-04-12 MED ORDER — ALBUMIN HUMAN 25 % IV SOLN
25.0000 g | Freq: Once | INTRAVENOUS | Status: DC
  Filled 2016-04-12: qty 100

## 2016-04-13 LAB — CYTOLOGY - NON PAP

## 2016-04-14 ENCOUNTER — Inpatient Hospital Stay: Payer: Medicare Other

## 2016-04-16 ENCOUNTER — Inpatient Hospital Stay: Payer: Medicare Other

## 2016-04-16 VITALS — BP 102/65 | HR 75 | Temp 99.1°F | Resp 20

## 2016-04-16 DIAGNOSIS — D500 Iron deficiency anemia secondary to blood loss (chronic): Secondary | ICD-10-CM | POA: Diagnosis not present

## 2016-04-16 MED ORDER — SODIUM CHLORIDE 0.9 % IV SOLN
Freq: Once | INTRAVENOUS | Status: AC
Start: 2016-04-16 — End: 2016-04-16
  Administered 2016-04-16: 15:00:00 via INTRAVENOUS
  Filled 2016-04-16: qty 1000

## 2016-04-16 MED ORDER — FERUMOXYTOL INJECTION 510 MG/17 ML
510.0000 mg | Freq: Once | INTRAVENOUS | Status: AC
  Administered 2016-04-16: 510 mg via INTRAVENOUS
  Filled 2016-04-16: qty 17

## 2016-04-16 NOTE — Patient Instructions (Signed)
Ferumoxytol injection What is this medicine? FERUMOXYTOL is an iron complex. Iron is used to make healthy red blood cells, which carry oxygen and nutrients throughout the body. This medicine is used to treat iron deficiency anemia in people with chronic kidney disease. COMMON BRAND NAME(S): Feraheme What should I tell my health care provider before I take this medicine? They need to know if you have any of these conditions: -anemia not caused by low iron levels -high levels of iron in the blood -magnetic resonance imaging (MRI) test scheduled -an unusual or allergic reaction to iron, other medicines, foods, dyes, or preservatives -pregnant or trying to get pregnant -breast-feeding How should I use this medicine? This medicine is for injection into a vein. It is given by a health care professional in a hospital or clinic setting. Talk to your pediatrician regarding the use of this medicine in children. Special care may be needed. What if I miss a dose? It is important not to miss your dose. Call your doctor or health care professional if you are unable to keep an appointment. What may interact with this medicine? This medicine may interact with the following medications: -other iron products What should I watch for while using this medicine? Visit your doctor or healthcare professional regularly. Tell your doctor or healthcare professional if your symptoms do not start to get better or if they get worse. You may need blood work done while you are taking this medicine. You may need to follow a special diet. Talk to your doctor. Foods that contain iron include: whole grains/cereals, dried fruits, beans, or peas, leafy green vegetables, and organ meats (liver, kidney). What side effects may I notice from receiving this medicine? Side effects that you should report to your doctor or health care professional as soon as possible: -allergic reactions like skin rash, itching or hives, swelling of the  face, lips, or tongue -breathing problems -changes in blood pressure -feeling faint or lightheaded, falls -fever or chills -flushing, sweating, or hot feelings -swelling of the ankles or feet Side effects that usually do not require medical attention (report to your doctor or health care professional if they continue or are bothersome): -diarrhea -headache -nausea, vomiting -stomach pain Where should I keep my medicine? This drug is given in a hospital or clinic and will not be stored at home.  2017 Elsevier/Gold Standard (2015-04-17 12:41:49)  

## 2016-04-21 ENCOUNTER — Inpatient Hospital Stay: Payer: Medicare Other

## 2016-04-21 VITALS — BP 101/67 | HR 78 | Temp 99.0°F | Resp 18

## 2016-04-21 DIAGNOSIS — D500 Iron deficiency anemia secondary to blood loss (chronic): Secondary | ICD-10-CM

## 2016-04-21 MED ORDER — SODIUM CHLORIDE 0.9 % IV SOLN
Freq: Once | INTRAVENOUS | Status: AC
  Administered 2016-04-21: 15:00:00 via INTRAVENOUS
  Filled 2016-04-21: qty 1000

## 2016-04-21 MED ORDER — SODIUM CHLORIDE 0.9 % IV SOLN
510.0000 mg | Freq: Once | INTRAVENOUS | Status: AC
  Administered 2016-04-21: 510 mg via INTRAVENOUS
  Filled 2016-04-21: qty 17

## 2016-04-26 ENCOUNTER — Encounter: Payer: Self-pay | Admitting: *Deleted

## 2016-04-27 LAB — HCV RT-PCR, QUANT (NON-GRAPH)
HCV Log 10: UNDETERMINED Log10copy/mL
IU log10: UNDETERMINED Log10 IU/mL

## 2016-04-29 ENCOUNTER — Other Ambulatory Visit
Admission: RE | Admit: 2016-04-29 | Discharge: 2016-04-29 | Disposition: A | Discharge status: Home / Self Care | Payer: Medicare Other | Source: Ambulatory Visit | Attending: Gastroenterology | Admitting: Gastroenterology

## 2016-04-29 DIAGNOSIS — K921 Melena: Secondary | ICD-10-CM | POA: Insufficient documentation

## 2016-04-29 DIAGNOSIS — D649 Anemia, unspecified: Secondary | ICD-10-CM | POA: Diagnosis present

## 2016-04-29 LAB — C DIFFICILE QUICK SCREEN W PCR REFLEX
C Diff antigen: NEGATIVE
C Diff interpretation: NOT DETECTED
C Diff toxin: NEGATIVE

## 2016-04-29 LAB — GASTROINTESTINAL PANEL BY PCR, STOOL (REPLACES STOOL CULTURE)

## 2016-05-04 NOTE — Progress Notes (Signed)
Hematology/Oncology Consult note [facility]  Telephone:([phone] Fax:([phone]  Patient Care Team: Jodi Marble, MD as PCP - General (Internal Medicine)   Name of the patient: Audrey Garrett  [id number]  November 30, 1960   Date of visit: 05/04/16  Diagnosis- iron deficiency anemia  Chief complaint/ Reason for visit- routine f/u of anemia  Heme/Onc history: Patient is a 56 year old female who was last seen by Dr. Alvy Bimler in jan 2018 for [place]. Patient has liver cirrhosis and is currently receiving Harvoni for hepatitis C. GI evaluation is as follows:  Upper EGD on 04/01/16 showed multiple dispersed diminutive erosions were found in the gastric antrum. There were no stigmata of recent bleeding. One healing cratered gastric ulcer with no stigmata of bleeding was found in the prepyloric region of the stomach. The lesion was 2 mm in largest dimension. Colonoscopy on 04/01/16 showed multiple small-mouthed diverticula were found in the sigmoid colon and descending colon. A single small localized angioectasia with typical arborization was found in the proximal ascending colon. Coagulation for tissue destruction using argon plasma at 0.5 liters/minute and 20 watts was successful.  She has received significant blood transfusions recently since March 2017. Her last blood transfusion was on 03/17/16. She had 2 units of blood She received 4 doses of IV Venofer  In Jan 2018 she received blood trasnsfusion followed by ferraheme.   Interval history- she is going through [employer] rx for Hep C which she will complete next month. Reports fatigue. Denies other complaints  ECOG PS- 1    Review of systems- Review of Systems  Constitutional: Positive for malaise/fatigue. Negative for chills, fever and weight loss.  HENT: Negative for congestion, ear discharge and nosebleeds.   Eyes: Negative for blurred vision.  Respiratory: Negative for cough, hemoptysis, sputum production,  shortness of breath and wheezing.   Cardiovascular: Negative for chest pain, palpitations, orthopnea and claudication.  Gastrointestinal: Negative for abdominal pain, blood in stool, constipation, diarrhea, heartburn, melena, nausea and vomiting.  Genitourinary: Negative for dysuria, flank pain, frequency, hematuria and urgency.  Musculoskeletal: Negative for back pain, joint pain and myalgias.  Skin: Negative for rash.  Neurological: Negative for dizziness, tingling, focal weakness, seizures, weakness and headaches.  Endo/Heme/Allergies: Does not bruise/bleed easily.  Psychiatric/Behavioral: Negative for depression and suicidal ideas. The patient does not have insomnia.      Current treatment- IV iron  No Known Allergies   Past Medical History:  Diagnosis Date  . Alcohol abuse   . Alcoholic cirrhosis of liver without ascites ([place])   . Anemia   . Anxiety   . Arthropathy   . Back pain   . Bronchitis   . BRONCHITIS, ACUTE 11/01/2009   Qualifier: Diagnosis of  By: Alveta Heimlich MD, Cornelia Copa    . Chronic hepatitis C ([place])   . Chronic LBP 12/25/2014   Overview:  Post extensive surgery   . Cirrhosis ([place])   . DDD (degenerative disc disease), lumbar 01/01/2015  . Depression   . Diabetes mellitus without complication ([place])   . Diverticulosis   . Edema   . Fatigue   . Fibromyalgia   . GERD (gastroesophageal reflux disease)    gastroparesis  . High risk medications (not anticoagulants) long-term use   . Hyperglycemia   . Hypertension   . Kidney mass    11/17 small mass-  . Left leg pain 01/01/2015  . Lumbago   . Lumbar radicular pain 01/01/2015  . Muscle spasm   . Neck  pain 01/01/2015  . Polyarthritis   . Reflux   . Restless leg syndrome    01/2016  . RLS (restless legs syndrome)   . Sacroiliac pain 01/01/2015  . Shoulder pain   . Sinusitis   . SOB (shortness of breath)   . Spine disorder   . Stress headaches   . Upper back pain 01/01/2015  . Urinary frequency   . Vitamin D  deficiency disease      Past Surgical History:  Procedure Laterality Date  . BACK SURGERY  12/19/2011  . BACK SURGERY    . COLONOSCOPY WITH PROPOFOL N/A 09/25/2015   Procedure: COLONOSCOPY WITH PROPOFOL;  Surgeon: Lollie Sails, MD;  Location: [facility] ENDOSCOPY;  Service: Endoscopy;  Laterality: N/A;  . COLONOSCOPY WITH PROPOFOL N/A 04/01/2016   Procedure: COLONOSCOPY WITH PROPOFOL;  Surgeon: Lollie Sails, MD;  Location: [facility] ENDOSCOPY;  Service: Endoscopy;  Laterality: N/A;  . ESOPHAGOGASTRODUODENOSCOPY N/A 04/14/2015   Procedure: ESOPHAGOGASTRODUODENOSCOPY (EGD);  Surgeon: Lollie Sails, MD;  Location: [facility] ENDOSCOPY;  Service: Endoscopy;  Laterality: N/A;  . ESOPHAGOGASTRODUODENOSCOPY (EGD) WITH PROPOFOL N/A 09/16/2015   Procedure: ESOPHAGOGASTRODUODENOSCOPY (EGD) WITH PROPOFOL;  Surgeon: Lollie Sails, MD;  Location: [facility] ENDOSCOPY;  Service: Endoscopy;  Laterality: N/A;  . ESOPHAGOGASTRODUODENOSCOPY (EGD) WITH PROPOFOL N/A 01/22/2016   Procedure: ESOPHAGOGASTRODUODENOSCOPY (EGD) WITH PROPOFOL;  Surgeon: [name], MD;  Location: [facility];  Service: Endoscopy;  Laterality: N/A;  . ESOPHAGOGASTRODUODENOSCOPY (EGD) WITH PROPOFOL N/A 04/01/2016   Procedure: ESOPHAGOGASTRODUODENOSCOPY (EGD) WITH PROPOFOL;  Surgeon: Lollie Sails, MD;  Location: [facility] ENDOSCOPY;  Service: Endoscopy;  Laterality: N/A;  . HERNIA REPAIR N/A 10/2015   abdominal   . TONSILLECTOMY    . TUBAL LIGATION    . UMBILICAL HERNIA REPAIR N/A 11/06/2015   Procedure: HERNIA REPAIR UMBILICAL ADULT;  Surgeon: [name], MD;  Location: [facility] ORS;  Service: General;  Laterality: N/A;    Social History   Social History  . Marital status: Married    Spouse name: N/A  . Number of children: N/A  . Years of education: N/A   Occupational History  . Not on file.   Social History Main Topics  . Smoking status: Current Every Day Smoker    Packs/day: 0.50    Years: 40.00    Types: Cigarettes  .  Smokeless tobacco: Never Used  . Alcohol use No     Comment: stopped drinking 05/12/15  . Drug use: No  . Sexual activity: Not on file   Other Topics Concern  . Not on file   Social History Narrative   Lives at home with husband    Family History  Problem Relation Age of Onset  . Stroke Mother   . Heart disease Mother   . [name] Father   . Breast cancer Sister 47  . Kidney disease Neg Hx   . Bladder Cancer Neg Hx   . Kidney cancer Neg Hx   . Prostate cancer Neg Hx      Current Outpatient Prescriptions:  .  calcium carbonate (TUMS - DOSED IN MG ELEMENTAL CALCIUM) 500 MG chewable tablet, Chew 2 tablets by mouth 2 (two) times daily., Disp: , Rfl:  .  ciprofloxacin (CIPRO) 500 MG tablet, Take 500 mg by mouth 2 (two) times a week. Monday and friday, Disp: , Rfl:  .  furosemide (LASIX) 20 MG tablet, Take 20 mg by mouth 2 (two) times daily. , Disp: , Rfl: 11 .  gabapentin (NEURONTIN) 400 MG  capsule, Take 400 mg by mouth 3 (three) times daily. , Disp: , Rfl: 2 .  lactulose (CHRONULAC) 10 GM/15ML solution, Take 10 g by mouth 3 (three) times daily. , Disp: , Rfl:  .  Ledipasvir-Sofosbuvir (HARVONI) 90-400 MG TABS, Take 1 tablet by mouth daily., Disp: , Rfl:  .  Magnesium Oxide 250 MG TABS, Take 1 tablet by mouth daily., Disp: , Rfl:  .  metFORMIN (GLUCOPHAGE) 500 MG tablet, Take 500 mg by mouth 2 (two) times daily with a meal. , Disp: , Rfl:  .  Multiple Vitamin (MULTIVITAMIN WITH MINERALS) TABS tablet, Take 1 tablet by mouth daily., Disp: , Rfl:  .  omeprazole (PRILOSEC) 20 MG capsule, Take 20 mg by mouth daily., Disp: , Rfl:  .  ondansetron (ZOFRAN-ODT) 4 MG disintegrating tablet, Take 4 mg by mouth 2 (two) times daily., Disp: , Rfl:  .  Oxycodone HCl 20 MG TABS, Take 1 tablet (20 mg total) by mouth every 6 (six) hours., Disp: 120 tablet, Rfl: 0 .  Oxycodone HCl 20 MG TABS, Take 1 tablet (20 mg total) by mouth every 6 (six) hours as needed., Disp: 120 tablet, Rfl: 0 .  Oxycodone HCl  20 MG TABS, Take 1 tablet (20 mg total) by mouth every 6 (six) hours as needed., Disp: 120 tablet, Rfl: 0 .  PARoxetine (PAXIL) 40 MG tablet, Take 40 mg by mouth every morning., Disp: , Rfl: 2 .  phytonadione (VITAMIN K) 5 MG tablet, Take 5 mg by mouth 3 (three) times a week. Mon, Wed, Fri, Disp: , Rfl:  .  spironolactone (ALDACTONE) 100 MG tablet, Take 200 mg by mouth 2 (two) times daily. , Disp: , Rfl: 1 .  sucralfate (CARAFATE) 1 GM/10ML suspension, Take 1 g by mouth 3 (three) times daily as needed. , Disp: , Rfl:  .  thiamine (VITAMIN B-1) 100 MG tablet, Take 1 tablet (100 mg total) by mouth daily., Disp: 90 tablet, Rfl: 6  Physical exam:  Vitals:   05/06/16 0925  BP: 104/65  Pulse: 79  Resp: 18  Temp: 98.2 F (36.8 C)  TempSrc: Tympanic  Weight: 144 lb 11.7 oz (65.6 kg)   Physical Exam  Constitutional: She is oriented to person, place, and time and well-developed, well-nourished, and in no distress.  HENT:  Head: Normocephalic and atraumatic.  Eyes: EOM are normal. Pupils are equal, round, and reactive to light.  Neck: Normal range of motion.  Cardiovascular: Normal rate, regular rhythm and normal heart sounds.   Pulmonary/Chest: Effort normal and breath sounds normal.  Abdominal: Soft. Bowel sounds are normal.  Neurological: She is alert and oriented to person, place, and time.  Skin: Skin is warm and dry.     CMP Latest Ref Rng & Units 04/12/2016  Glucose 65 - 99 mg/dL 101(H)  BUN 6 - 20 mg/dL 7  Creatinine 0.44 - 1.00 mg/dL 0.69  Sodium 135 - 145 mmol/L 130(L)  Potassium 3.5 - 5.1 mmol/L 3.4(L)  Chloride 101 - 111 mmol/L 97(L)  CO2 22 - 32 mmol/L 26  Calcium 8.9 - 10.3 mg/dL 9.3  Total Protein 6.5 - 8.1 g/dL 7.3  Total Bilirubin 0.3 - 1.2 mg/dL 0.7  Alkaline Phos 38 - 126 U/L 59  AST 15 - 41 U/L 20  ALT 14 - 54 U/L 9(L)   CBC Latest Ref Rng & Units 04/12/2016  WBC 3.6 - 11.0 K/uL 5.0  Hemoglobin 12.0 - 16.0 g/dL 10.3(L)  Hematocrit 35.0 - 47.0 % 31.6(L)  Platelets 150 - 440 K/uL 163    No images are attached to the encounter.  US Abdomen Limited  Result Date: 04/12/2016 CLINICAL DATA:  Abdominal distension, cirrhosis, ascites EXAM: LIMITED ABDOMEN ULTRASOUND FOR ASCITES TECHNIQUE: Limited ultrasound survey for ascites was performed in all four abdominal quadrants. COMPARISON:  02/17/2016 FINDINGS: Moderate to large volume of abdominal ascites throughout the abdominal 4 quadrants. Fluid volume is amenable to paracentesis. IMPRESSION: Moderate to large volume of abdominal ascites. Electronically Signed   By: Jerilynn Mages.  Shick M.D.   On: 04/12/2016 13:30   US Paracentesis  Result Date: 04/12/2016 INDICATION: Hepatitis-C, cirrhosis, ascites, abdominal distention and discomfort EXAM: ULTRASOUND GUIDED PARACENTESIS MEDICATIONS: 1% lidocaine locally COMPLICATIONS: None immediate. PROCEDURE: An ultrasound guided paracentesis was thoroughly discussed with the patient and questions answered. The benefits, risks, alternatives and complications were also discussed. The patient understands and wishes to proceed with the procedure. Written consent was obtained. Ultrasound was performed to localize and mark an adequate pocket of fluid in the right lower quadrant of the abdomen. The area was then prepped and draped in the normal sterile fashion. 1% Lidocaine was used for local anesthesia. Under ultrasound guidance a 19 gauge Yueh catheter was introduced. Paracentesis was performed. The catheter was removed and a dressing applied. FINDINGS: A total of approximately 4.8 L of yellow peritoneal fluid was removed. A fluid sample was sent for laboratory analysis. IMPRESSION: Successful ultrasound guided paracentesis yielding 4.8 L of ascites. Electronically Signed   By: Jerilynn Mages.  Shick M.D.   On: 04/12/2016 14:13     Assessment and plan- Patient is a 56 y.o. female with a h/o severe iron deficiency anemia from GI bleeding  1. H/H stable at 10.4/31 as compared to 04/12/16. Last dose  of IV iron 2 weeks ago which can take some time to show effect. Ferritin is 74 today. I will hold off on IV iron today. We will recheck cbc and ferritin in 4 weeks. I will see her back in 3 months with cbc and iron studies. Her fatigue could be partly from ongoing rx for HepC.   Visit Diagnosis 1. Iron deficiency anemia due to chronic blood loss      Dr. Randa Evens, MD, MPH [facility] Pager- [phone] 05/06/2016 10:24 AM

## 2016-05-06 ENCOUNTER — Inpatient Hospital Stay: Payer: Medicare Other | Attending: Oncology | Admitting: Oncology

## 2016-05-06 ENCOUNTER — Other Ambulatory Visit: Payer: Self-pay | Admitting: *Deleted

## 2016-05-06 ENCOUNTER — Inpatient Hospital Stay: Payer: Medicare Other

## 2016-05-06 ENCOUNTER — Other Ambulatory Visit: Payer: Self-pay | Admitting: Hematology and Oncology

## 2016-05-06 ENCOUNTER — Telehealth: Payer: Self-pay | Admitting: *Deleted

## 2016-05-06 VITALS — BP 104/65 | HR 79 | Temp 98.2°F | Resp 18 | Wt 144.7 lb

## 2016-05-06 DIAGNOSIS — K219 Gastro-esophageal reflux disease without esophagitis: Secondary | ICD-10-CM | POA: Insufficient documentation

## 2016-05-06 DIAGNOSIS — I10 Essential (primary) hypertension: Secondary | ICD-10-CM | POA: Diagnosis not present

## 2016-05-06 DIAGNOSIS — B182 Chronic viral hepatitis C: Secondary | ICD-10-CM | POA: Diagnosis not present

## 2016-05-06 DIAGNOSIS — F419 Anxiety disorder, unspecified: Secondary | ICD-10-CM | POA: Diagnosis not present

## 2016-05-06 DIAGNOSIS — M797 Fibromyalgia: Secondary | ICD-10-CM | POA: Diagnosis not present

## 2016-05-06 DIAGNOSIS — G2581 Restless legs syndrome: Secondary | ICD-10-CM | POA: Diagnosis not present

## 2016-05-06 DIAGNOSIS — M5116 Intervertebral disc disorders with radiculopathy, lumbar region: Secondary | ICD-10-CM | POA: Diagnosis not present

## 2016-05-06 DIAGNOSIS — E119 Type 2 diabetes mellitus without complications: Secondary | ICD-10-CM | POA: Diagnosis not present

## 2016-05-06 DIAGNOSIS — D500 Iron deficiency anemia secondary to blood loss (chronic): Secondary | ICD-10-CM

## 2016-05-06 DIAGNOSIS — E559 Vitamin D deficiency, unspecified: Secondary | ICD-10-CM | POA: Diagnosis not present

## 2016-05-06 DIAGNOSIS — K7460 Unspecified cirrhosis of liver: Secondary | ICD-10-CM | POA: Insufficient documentation

## 2016-05-06 DIAGNOSIS — F17210 Nicotine dependence, cigarettes, uncomplicated: Secondary | ICD-10-CM | POA: Insufficient documentation

## 2016-05-06 DIAGNOSIS — F329 Major depressive disorder, single episode, unspecified: Secondary | ICD-10-CM | POA: Insufficient documentation

## 2016-05-06 DIAGNOSIS — Z7984 Long term (current) use of oral hypoglycemic drugs: Secondary | ICD-10-CM | POA: Insufficient documentation

## 2016-05-06 DIAGNOSIS — K259 Gastric ulcer, unspecified as acute or chronic, without hemorrhage or perforation: Secondary | ICD-10-CM

## 2016-05-06 DIAGNOSIS — Z79899 Other long term (current) drug therapy: Secondary | ICD-10-CM

## 2016-05-06 DIAGNOSIS — K5730 Diverticulosis of large intestine without perforation or abscess without bleeding: Secondary | ICD-10-CM | POA: Diagnosis not present

## 2016-05-06 LAB — FERRITIN: Ferritin: 74 ng/mL (ref 11–307)

## 2016-05-06 LAB — CBC WITH DIFFERENTIAL/PLATELET
Basophils Absolute: 0 10*3/uL (ref 0–0.1)
Basophils Relative: 0 %
Eosinophils Absolute: 0.1 10*3/uL (ref 0–0.7)
Eosinophils Relative: 1 %
HCT: 31.1 % — ABNORMAL LOW (ref 35.0–47.0)
Hemoglobin: 10.4 g/dL — ABNORMAL LOW (ref 12.0–16.0)
Lymphocytes Relative: 16 %
Lymphs Abs: 1 10*3/uL (ref 1.0–3.6)
MCH: 31.8 pg (ref 26.0–34.0)
MCHC: 33.2 g/dL (ref 32.0–36.0)
MCV: 95.6 fL (ref 80.0–100.0)
Monocytes Absolute: 0.6 10*3/uL (ref 0.2–0.9)
Monocytes Relative: 10 %
Neutro Abs: 4.5 10*3/uL (ref 1.4–6.5)
Neutrophils Relative %: 73 %
Platelets: 192 10*3/uL (ref 150–440)
RBC: 3.26 MIL/uL — ABNORMAL LOW (ref 3.80–5.20)
RDW: 24.1 % — ABNORMAL HIGH (ref 11.5–14.5)
WBC: 6.2 10*3/uL (ref 3.6–11.0)

## 2016-05-06 LAB — SAMPLE TO BLOOD BANK

## 2016-05-06 NOTE — Telephone Encounter (Signed)
Called pt and left message that her ferritin is 74 and she does not need feraheme today. We will recheck her level in 4 weeks and decide from there and she already has appt

## 2016-05-06 NOTE — Progress Notes (Signed)
Here for follow up. concerened w amonia level-curious if she she needs to have it drawn -feeling 'foggy' per pt. Asking for option of a port

## 2016-05-13 DIAGNOSIS — B182 Chronic viral hepatitis C: Secondary | ICD-10-CM | POA: Insufficient documentation

## 2016-05-20 ENCOUNTER — Encounter: Payer: Self-pay | Admitting: Pain Medicine

## 2016-05-20 ENCOUNTER — Other Ambulatory Visit
Admission: RE | Admit: 2016-05-20 | Discharge: 2016-05-20 | Disposition: A | Discharge status: Home / Self Care | Payer: Medicare Other | Source: Ambulatory Visit | Attending: Gastroenterology | Admitting: Gastroenterology

## 2016-05-20 ENCOUNTER — Ambulatory Visit: Payer: Medicare Other | Attending: Pain Medicine | Admitting: Pain Medicine

## 2016-05-20 VITALS — BP 124/82 | HR 97 | Temp 97.8°F | Resp 16 | Ht 70.0 in | Wt 142.0 lb

## 2016-05-20 DIAGNOSIS — G8929 Other chronic pain: Secondary | ICD-10-CM

## 2016-05-20 DIAGNOSIS — M7989 Other specified soft tissue disorders: Secondary | ICD-10-CM | POA: Insufficient documentation

## 2016-05-20 DIAGNOSIS — M5416 Radiculopathy, lumbar region: Secondary | ICD-10-CM

## 2016-05-20 DIAGNOSIS — M5442 Lumbago with sciatica, left side: Secondary | ICD-10-CM

## 2016-05-20 DIAGNOSIS — G894 Chronic pain syndrome: Secondary | ICD-10-CM | POA: Diagnosis not present

## 2016-05-20 DIAGNOSIS — F1123 Opioid dependence with withdrawal: Secondary | ICD-10-CM | POA: Insufficient documentation

## 2016-05-20 DIAGNOSIS — M5136 Other intervertebral disc degeneration, lumbar region: Secondary | ICD-10-CM | POA: Diagnosis not present

## 2016-05-20 DIAGNOSIS — M47816 Spondylosis without myelopathy or radiculopathy, lumbar region: Secondary | ICD-10-CM | POA: Diagnosis not present

## 2016-05-20 DIAGNOSIS — G2581 Restless legs syndrome: Secondary | ICD-10-CM | POA: Diagnosis not present

## 2016-05-20 DIAGNOSIS — M4310 Spondylolisthesis, site unspecified: Secondary | ICD-10-CM

## 2016-05-20 DIAGNOSIS — M797 Fibromyalgia: Secondary | ICD-10-CM | POA: Diagnosis not present

## 2016-05-20 DIAGNOSIS — F1190 Opioid use, unspecified, uncomplicated: Secondary | ICD-10-CM

## 2016-05-20 DIAGNOSIS — Z79891 Long term (current) use of opiate analgesic: Secondary | ICD-10-CM

## 2016-05-20 DIAGNOSIS — M542 Cervicalgia: Secondary | ICD-10-CM | POA: Diagnosis not present

## 2016-05-20 DIAGNOSIS — E1165 Type 2 diabetes mellitus with hyperglycemia: Secondary | ICD-10-CM | POA: Diagnosis not present

## 2016-05-20 DIAGNOSIS — B182 Chronic viral hepatitis C: Secondary | ICD-10-CM | POA: Diagnosis not present

## 2016-05-20 DIAGNOSIS — K7469 Other cirrhosis of liver: Secondary | ICD-10-CM | POA: Insufficient documentation

## 2016-05-20 DIAGNOSIS — F329 Major depressive disorder, single episode, unspecified: Secondary | ICD-10-CM | POA: Insufficient documentation

## 2016-05-20 DIAGNOSIS — M545 Low back pain: Secondary | ICD-10-CM | POA: Diagnosis present

## 2016-05-20 DIAGNOSIS — M1288 Other specific arthropathies, not elsewhere classified, other specified site: Secondary | ICD-10-CM | POA: Diagnosis not present

## 2016-05-20 DIAGNOSIS — D500 Iron deficiency anemia secondary to blood loss (chronic): Secondary | ICD-10-CM

## 2016-05-20 LAB — AMMONIA: Ammonia: <9 umol/L — ABNORMAL LOW (ref 9–35)

## 2016-05-20 MED ORDER — OXYCODONE HCL 20 MG PO TABS: 1.0000 | ORAL_TABLET | Freq: Four times a day (QID) | ORAL | 0 refills | Status: DC

## 2016-05-20 MED ORDER — OXYCODONE HCL 20 MG PO TABS: 1.0000 | ORAL_TABLET | Freq: Four times a day (QID) | ORAL | 0 refills | Status: DC | PRN

## 2016-05-20 NOTE — Progress Notes (Signed)
[name] Name: Audrey Garrett  MRN: [id number]  Referring Provider: Jodi Marble, MD  DOB: [birth year]  PCP: Jodi Marble, MD  DOS: 05/20/2016  Note by: Kathlen Brunswick. Dossie Arbour, MD  Service setting: Ambulatory outpatient  Specialty: Interventional Pain Management  Location: [facility] (AMB) Pain Management Facility    Patient type: Established   Primary Reason(s) for Visit: Encounter for prescription drug management (Level of risk: moderate) CC: Back Pain (lower)  HPI  Audrey Garrett is a 56 y.o. year old, female patient, who comes today for a medication management evaluation. She has Insomnia, persistent; BP (high blood pressure); Cervical radicular pain; Uncomplicated opioid dependence ([place]); Long term prescription opiate use; Failed back surgical syndrome; Lumbar facet syndrome (Bilateral) (L>R); Osteoarthritis of spine with radiculopathy, lumbar region; Pain of paraspinal muscle; Chronic low back pain (Location of Primary Source of Pain) (Bilateral) (L>R); HCV (hepatitis C virus); At risk for osteopenia; Lumbar spondylosis; Chronic lumbar radicular pain (Location of Secondary source of pain) (Left); Chronic neck pain; Chronic lower extremity pain (Left); Chronic sacroiliac joint pain; Chronic upper back pain; Long term current use of opiate analgesic; Encounter for chronic pain management; Hypomagnesemia (low magnesium levels); Hypotension; Anemia; Thrombocytopenia ([place]); Coagulopathy ([place]); Hyperglycemia; Polyneuropathy ([place]); Urinary retention; Difficulty urinating; Nephrolithiasis; Alcoholic cirrhosis of liver with ascites ([place]); Depression, major, recurrent, moderate ([place]); Abdominal pain, chronic, epigastric; Hepatic cirrhosis ([place]); Acute GI bleeding; Neurogenic pain; S/P thoracolumbar fusion (T5-L5); Grade 1 Retrolisthesis (4 mm) of C5 over C6 and C6 over C7; Cervical foraminal stenosis (Left C5-6; Bilateral C6-7); Hernia of anterior abdominal wall; Incarcerated umbilical hernia; Controlled type 2  diabetes mellitus with hyperglycemia ([place]); Acute blood loss anemia; Chronic pain syndrome; Malnutrition of moderate degree; Iron deficiency anemia due to chronic blood loss; Opiate use; RLS (restless legs syndrome); Bilateral leg edema; and Depression, major, in remission ([place]) on her problem list. Her primarily concern today is the Back Pain (lower)  Pain Assessment: Self-Reported Pain Score: 4 /10 Clinically the patient looks like a 1/10 Reported level is inconsistent with clinical observations. Information on the proper use of the pain scale provided to the patient today Pain Type: Chronic pain Pain Location: Back Pain Orientation: Lower Pain Descriptors / Indicators: Dull Pain Frequency: Constant  Audrey Garrett was last scheduled for an appointment on 01/29/2016 for medication management. During today's appointment we reviewed Audrey Garrett's chronic pain status, as well as her outpatient medication regimen. Today the patient was informed that we will likely start tapering his opioids down to bring his tolerance to a more manageable level. His current MME/Day is 120 mg/day. Our goal is going to be to bring the MME/Day to 60 mg/day, as recommended by the CDC guidelines. We will not be starting this today. We will use this visit to notify the patient so as to allow him to the idea that this will happen. They will provide him with information regarding the "Drug Holiday".  The patient  reports that she does not use drugs. Her body mass index is 20.37 kg/m.  Further details on both, my assessment(s), as well as the proposed treatment plan, please see below.  Controlled Substance Pharmacotherapy Assessment REMS (Risk Evaluation and Mitigation Strategy)  Analgesic:Oxycodone IR 20 mg every 6 (80 mg/day) MME/day:120 mg/day. Landis Martins, RN  05/20/2016 10:40 AM  Sign at close encounter Nursing Pain Medication Assessment:  Safety precautions to be maintained throughout the outpatient stay will  include: orient to surroundings, keep bed in low position, maintain call bell within reach at  all times, provide assistance with transfer out of bed and ambulation.  Medication Inspection Compliance: Pill count conducted under aseptic conditions, in front of the patient. Neither the pills nor the bottle was removed from the patient's sight at any time. Once count was completed pills were immediately returned to the patient in their original bottle.  Medication: Oxycodone IR Pill/Patch Count: 102 of 120 pills remain Bottle Appearance: Standard pharmacy container. Clearly labeled. Filled Date: 02 / 17/ 2018 Last Medication intake:  Today   Pharmacokinetics: Liberation and absorption (onset of action): WNL Distribution (time to peak effect): WNL Metabolism and excretion (duration of action): WNL         Pharmacodynamics: Desired effects: Analgesia: Audrey Garrett reports >50% benefit. Functional ability: Patient reports that medication allows her to accomplish basic ADLs Clinically meaningful improvement in function (CMIF): Sustained CMIF goals met Perceived effectiveness: Described as relatively effective, allowing for increase in activities of daily living (ADL) Undesirable effects: Side-effects or Adverse reactions: None reported Monitoring: [place] PMP: Online review of the past 107-monthperiod conducted. Compliant with practice rules and regulations List of all UDS test(s) done:  Lab Results  Component Value Date   TOXASSSELUR FINAL 12/22/2015   TOXASSSELUR FINAL 10/01/2015   TOXASSSELUR FINAL 07/02/2015   TO[place]FINAL 04/03/2015   Last UDS on record: ToxAssure Select 13  Date Value Ref Range Status  12/22/2015 FINAL  Final    Comment:    ==================================================================== TOXASSURE SELECT 13 (MW) ==================================================================== Test                             Result       Flag       Units Drug Present and  Declared for Prescription Verification   Oxycodone                      14280        EXPECTED   ng/mg creat   Oxymorphone                    1340         EXPECTED   ng/mg creat   Noroxycodone                   >40000       EXPECTED   ng/mg creat   Noroxymorphone                 836          EXPECTED   ng/mg creat    Sources of oxycodone are scheduled prescription medications.    Oxymorphone, noroxycodone, and noroxymorphone are expected    metabolites of oxycodone. Oxymorphone is also available as a    scheduled prescription medication. ==================================================================== Test                      Result    Flag   Units      Ref Range   Creatinine              25               mg/dL      >=20 ==================================================================== Declared Medications:  The flagging and interpretation on this report are based on the  following declared medications.  Unexpected results may arise from  inaccuracies in the declared medications.  **Note: The testing scope of this panel includes these medications:  Oxycodone  Oxycodone (Percocet)  **Note: The testing scope of this panel does not include following  reported medications:  Acetaminophen (Percocet)  Calcium Carbonate (Calcium Carb)  Ciprofloxacin (Cipro)  Citalopram (Celexa)  Furosemide (Lasix)  Gabapentin  Iron (Ferrous Sulfate)  Lactulose  Ledipasvir  Magnesium Oxide  Metformin  Multivitamin  Pantoprazole (Protonix)  Paroxetine (Paxil)  Sofosbuvir  Spironolactone (Aldactone)  Sucralfate (Carafate)  Vitamin K ==================================================================== For clinical consultation, please call [phone]. ====================================================================    UDS interpretation: Compliant          Medication Assessment Form: Reviewed. Patient indicates being compliant with therapy Treatment compliance: Compliant Risk  Assessment Profile: Aberrant behavior: See prior evaluations. None observed or detected today Comorbid factors increasing risk of overdose: See prior notes. No additional risks detected today Risk of substance use disorder (SUD): Low Opioid Risk Tool (ORT) Total Score: 3  Interpretation Table:  Score <3 = Low Risk for SUD  Score between 4-7 = Moderate Risk for SUD  Score >8 = High Risk for Opioid Abuse   Risk Mitigation Strategies:  Patient Counseling: Covered Patient-Prescriber Agreement (PPA): Present and active  Notification to other healthcare providers: Done  Pharmacologic Plan: No change in therapy, at this time  Laboratory Chemistry  Inflammation Markers Lab Results  Component Value Date   ESRSEDRATE 27 04/03/2015   CRP <0.5 04/03/2015   Renal Function Lab Results  Component Value Date   BUN 7 04/12/2016   CREATININE 0.69 04/12/2016   GFRAA >60 04/12/2016   GFRNONAA >60 04/12/2016   Hepatic Function Lab Results  Component Value Date   AST 20 04/12/2016   ALT 9 (L) 04/12/2016   ALBUMIN 3.5 04/12/2016   Electrolytes Lab Results  Component Value Date   NA 130 (L) 04/12/2016   K 3.4 (L) 04/12/2016   CL 97 (L) 04/12/2016   CALCIUM 9.3 04/12/2016   MG 1.9 01/22/2016   Pain Modulating Vitamins Lab Results  Component Value Date   VITAMINB12 591 02/13/2016   Coagulation Parameters Lab Results  Component Value Date   INR 1.21 04/01/2016   LABPROT 15.4 (H) 04/01/2016   APTT 34 01/20/2016   PLT 192 05/06/2016   Cardiovascular Lab Results  Component Value Date   BNP 929 (H) 09/23/2013   HGB 10.4 (L) 05/06/2016   HCT 31.1 (L) 05/06/2016   Note: Lab results reviewed.  Recent Diagnostic Imaging Review  No results found. Note: Imaging results reviewed.          Meds  The patient has a current medication list which includes the following prescription(s): calcium carbonate, ciprofloxacin, furosemide, lactulose, ledipasvir-sofosbuvir, magnesium oxide,  multivitamin with minerals, omeprazole, oxycodone hcl, oxycodone hcl, oxycodone hcl, paroxetine, phytonadione, pregabalin, spironolactone, sucralfate, and thiamine.  Current Outpatient Prescriptions on File Prior to Visit  Medication Sig   calcium carbonate (TUMS - DOSED IN MG ELEMENTAL CALCIUM) 500 MG chewable tablet Chew 2 tablets by mouth 2 (two) times daily.   ciprofloxacin (CIPRO) 500 MG tablet Take 500 mg by mouth 2 (two) times a week. Monday and friday   lactulose (CHRONULAC) 10 GM/15ML solution Take 10 g by mouth 3 (three) times daily.    Ledipasvir-Sofosbuvir (HARVONI) 90-400 MG TABS Take 1 tablet by mouth daily.   Magnesium Oxide 250 MG TABS Take 1 tablet by mouth daily.   Multiple Vitamin (MULTIVITAMIN WITH MINERALS) TABS tablet Take 1 tablet by mouth daily.   omeprazole (PRILOSEC) 20 MG capsule Take 20 mg by mouth daily.   PARoxetine (PAXIL) 40 MG  tablet Take 40 mg by mouth every morning.   phytonadione (VITAMIN K) 5 MG tablet Take 5 mg by mouth 3 (three) times a week. Mon, Wed, Fri   spironolactone (ALDACTONE) 100 MG tablet Take 200 mg by mouth 2 (two) times daily.    sucralfate (CARAFATE) 1 GM/10ML suspension Take 1 g by mouth 3 (three) times daily as needed.    thiamine (VITAMIN B-1) 100 MG tablet Take 1 tablet (100 mg total) by mouth daily.   No current facility-administered medications on file prior to visit.    ROS  Constitutional: Denies any fever or chills Gastrointestinal: No reported hemesis, hematochezia, vomiting, or acute GI distress Musculoskeletal: Denies any acute onset joint swelling, redness, loss of ROM, or weakness Neurological: No reported episodes of acute onset apraxia, aphasia, dysarthria, agnosia, amnesia, paralysis, loss of coordination, or loss of consciousness  Allergies  Ms. Schnepf has No Known Allergies.  PFSH  Drug: Ms. Bashore  reports that she does not use drugs. Alcohol:  reports that she does not drink alcohol. Tobacco:  reports  that she has been smoking Cigarettes.  She has a 20.00 pack-year smoking history. She has never used smokeless tobacco. Medical:  has a past medical history of Alcohol abuse; Alcoholic cirrhosis of liver without ascites ([place]); Anemia; Anxiety; Arthropathy; Back pain; Bronchitis; BRONCHITIS, ACUTE (11/01/2009); Chronic hepatitis C ([place]); Chronic LBP (12/25/2014); Cirrhosis ([place]); DDD (degenerative disc disease), lumbar (01/01/2015); Depression; Diabetes mellitus without complication ([place]); Diverticulosis; Edema; Fatigue; Fibromyalgia; GERD (gastroesophageal reflux disease); High risk medications (not anticoagulants) long-term use; Hyperglycemia; Hypertension; Kidney mass; Left leg pain (01/01/2015); Lumbago; Lumbar radicular pain (01/01/2015); Muscle spasm; Neck pain (01/01/2015); Polyarthritis; Reflux; Restless leg syndrome; RLS (restless legs syndrome); Sacroiliac pain (01/01/2015); Shoulder pain; Sinusitis; SOB (shortness of breath); Spine disorder; Stress headaches; Upper back pain (01/01/2015); Urinary frequency; and Vitamin D deficiency disease. Family: family history includes [name] in her father; Breast cancer (age of onset: 35) in her sister; Heart disease in her mother; Stroke in her mother.  Past Surgical History:  Procedure Laterality Date   BACK SURGERY  12/19/2011   BACK SURGERY     COLONOSCOPY WITH PROPOFOL N/A 09/25/2015   Procedure: COLONOSCOPY WITH PROPOFOL;  Surgeon: Lollie Sails, MD;  Location: [facility] ENDOSCOPY;  Service: Endoscopy;  Laterality: N/A;   COLONOSCOPY WITH PROPOFOL N/A 04/01/2016   Procedure: COLONOSCOPY WITH PROPOFOL;  Surgeon: Lollie Sails, MD;  Location: [facility] ENDOSCOPY;  Service: Endoscopy;  Laterality: N/A;   ESOPHAGOGASTRODUODENOSCOPY N/A 04/14/2015   Procedure: ESOPHAGOGASTRODUODENOSCOPY (EGD);  Surgeon: Lollie Sails, MD;  Location: [facility] ENDOSCOPY;  Service: Endoscopy;  Laterality: N/A;   ESOPHAGOGASTRODUODENOSCOPY (EGD) WITH PROPOFOL N/A 09/16/2015    Procedure: ESOPHAGOGASTRODUODENOSCOPY (EGD) WITH PROPOFOL;  Surgeon: Lollie Sails, MD;  Location: [facility] ENDOSCOPY;  Service: Endoscopy;  Laterality: N/A;   ESOPHAGOGASTRODUODENOSCOPY (EGD) WITH PROPOFOL N/A 01/22/2016   Procedure: ESOPHAGOGASTRODUODENOSCOPY (EGD) WITH PROPOFOL;  Surgeon: [name], MD;  Location: [place];  Service: Endoscopy;  Laterality: N/A;   ESOPHAGOGASTRODUODENOSCOPY (EGD) WITH PROPOFOL N/A 04/01/2016   Procedure: ESOPHAGOGASTRODUODENOSCOPY (EGD) WITH PROPOFOL;  Surgeon: Lollie Sails, MD;  Location: [facility] ENDOSCOPY;  Service: Endoscopy;  Laterality: N/A;   HERNIA REPAIR N/A 10/2015   abdominal    TONSILLECTOMY     TUBAL LIGATION     UMBILICAL HERNIA REPAIR N/A 11/06/2015   Procedure: HERNIA REPAIR UMBILICAL ADULT;  Surgeon: [name], MD;  Location: [facility] ORS;  Service: General;  Laterality: N/A;   Constitutional Exam  General appearance:  Well nourished, well developed, and well hydrated. In no apparent acute distress Vitals:   05/20/16 1023  BP: 124/82  Pulse: 97  Resp: 16  Temp: 97.8 F (36.6 C)  TempSrc: Oral  SpO2: (!) 87%  Weight: 142 lb (64.4 kg)  Height: _0  (1.778 m)   BMI Assessment: Estimated body mass index is 20.37 kg/m as calculated from the following:   Height as of this encounter: _1  (1.778 m).   Weight as of this encounter: 142 lb (64.4 kg).  BMI interpretation table: BMI level Category Range association with higher incidence of chronic pain  <18 kg/m2 Underweight   18.5-24.9 kg/m2 Ideal body weight   25-29.9 kg/m2 Overweight Increased incidence by 20%  30-34.9 kg/m2 Obese (Class I) Increased incidence by 68%  35-39.9 kg/m2 Severe obesity (Class II) Increased incidence by 136%  >40 kg/m2 Extreme obesity (Class III) Increased incidence by 254%   BMI Readings from Last 4 Encounters:  05/20/16 20.37 kg/m  05/06/16 20.77 kg/m  04/07/16 22.14 kg/m  04/01/16 21.38 kg/m   Wt Readings from Last 4  Encounters:  05/20/16 142 lb (64.4 kg)  05/06/16 144 lb 11.7 oz (65.6 kg)  04/07/16 154 lb 5.2 oz (70 kg)  04/01/16 149 lb (67.6 kg)  Psych/Mental status: Alert, oriented x 3 (person, place, & time)       Eyes: PERLA Respiratory: No evidence of acute respiratory distress  Cervical Spine Exam  Inspection: No masses, redness, or swelling Alignment: Symmetrical Functional ROM: Unrestricted ROM Stability: No instability detected Muscle strength & Tone: Functionally intact Sensory: Unimpaired Palpation: Non-contributory  Upper Extremity (UE) Exam    Side: Right upper extremity  Side: Left upper extremity  Inspection: No masses, redness, swelling, or asymmetry. No contractures  Inspection: No masses, redness, swelling, or asymmetry. No contractures  Functional ROM: Unrestricted ROM          Functional ROM: Unrestricted ROM          Muscle strength & Tone: Functionally intact  Muscle strength & Tone: Functionally intact  Sensory: Unimpaired  Sensory: Unimpaired  Palpation: Euthermic  Palpation: Euthermic  Specialized Test(s): Deferred         Specialized Test(s): Deferred          Thoracic Spine Exam  Inspection: No masses, redness, or swelling Alignment: Symmetrical Functional ROM: Unrestricted ROM Stability: No instability detected Sensory: Unimpaired Muscle strength & Tone: Functionally intact Palpation: Non-contributory  Lumbar Spine Exam  Inspection: No masses, redness, or swelling Alignment: Symmetrical Functional ROM: Decreased ROM Stability: No instability detected Muscle strength & Tone: Functionally intact Sensory: Movement-associated pain Palpation: Complains of area being tender to palpation Provocative Tests: Lumbar Hyperextension and rotation test: Positive bilaterally for facet joint pain. Patrick's Maneuver: evaluation deferred today              Gait & Posture Assessment  Ambulation: Unassisted Gait: Relatively normal for age and body habitus Posture: WNL    Lower Extremity Exam    Side: Right lower extremity  Side: Left lower extremity  Inspection: No masses, redness, swelling, or asymmetry. No contractures  Inspection: No masses, redness, swelling, or asymmetry. No contractures  Functional ROM: Unrestricted ROM          Functional ROM: Unrestricted ROM          Muscle strength & Tone: Functionally intact  Muscle strength & Tone: Functionally intact  Sensory: Unimpaired  Sensory: Unimpaired  Palpation: No palpable anomalies  Palpation: No palpable anomalies   Assessment  Primary Diagnosis & Pertinent Problem List: The primary encounter diagnosis was Chronic pain syndrome. Diagnoses of Chronic hepatitis C without hepatic coma (HCC), Iron deficiency anemia due to chronic blood loss, Chronic low back pain (Location of Primary Source of Pain) (Bilateral) (L>R), Chronic lumbar radicular pain (Location of Secondary source of pain) (Left), Grade 1 Retrolisthesis (4 mm) of C5 over C6 and C6 over C7, Lumbar facet syndrome (Bilateral), Lumbar spondylosis, Long term current use of opiate analgesic, and Opiate use were also pertinent to this visit.  Status Diagnosis  Controlled Controlled Controlled 1. Chronic pain syndrome   2. Chronic hepatitis C without hepatic coma ([place])   3. Iron deficiency anemia due to chronic blood loss   4. Chronic low back pain (Location of Primary Source of Pain) (Bilateral) (L>R)   5. Chronic lumbar radicular pain (Location of Secondary source of pain) (Left)   6. Grade 1 Retrolisthesis (4 mm) of C5 over C6 and C6 over C7   7. Lumbar facet syndrome (Bilateral)   8. Lumbar spondylosis   9. Long term current use of opiate analgesic   10. Opiate use      Plan of Care  Pharmacotherapy (Medications Ordered): Meds ordered this encounter  Medications   Oxycodone HCl 20 MG TABS    Sig: Take 1 tablet (20 mg total) by mouth every 6 (six) hours.    Dispense:  120 tablet    Refill:  0    Fill one day early if pharmacy is  closed on scheduled refill date. Do not fill until: 05/27/16 To last until: 06/26/16   Oxycodone HCl 20 MG TABS    Sig: Take 1 tablet (20 mg total) by mouth every 6 (six) hours as needed.    Dispense:  120 tablet    Refill:  0    Fill one day early if pharmacy is closed on scheduled refill date. Do not fill until: 06/26/16 To last until: 07/26/16   Oxycodone HCl 20 MG TABS    Sig: Take 1 tablet (20 mg total) by mouth every 6 (six) hours as needed.    Dispense:  120 tablet    Refill:  0    Fill one day early if pharmacy is closed on scheduled refill date. Do not fill until: 07/26/16 To last until: 08/25/16   New Prescriptions   No medications on file   Medications administered today: Ms. Friberg had no medications administered during this visit. Lab-work, procedure(s), and/or referral(s): Orders Placed This Encounter  Procedures   LUMBAR FACET(MEDIAL BRANCH NERVE BLOCK) MBNB   LUMBAR FACET(MEDIAL BRANCH NERVE BLOCK) MBNB   ToxASSURE Select 13 (MW), Urine   Imaging and/or referral(s): None  Interventional therapies: Planned, scheduled, and/or pending:   Diagnostic bilateral lumbar facet block under fluoroscopic guidance and IV sedation    Considering:   Diagnostic bilateral lumbar facet block  Possible bilateral lumbar facet RFA  Diagnostic left caudal Epidural steroid injection + diagnostic epidurogram  Possible Racz procedure  Diagnostic bilateral sacroiliac joint injection  Possible bilateral sacroiliac joint RFA  Diagnostic left cervical epidural steroid injection  Diagnostic bilateral cervical facet block  Possible bilateral cervical facet RFA    Palliative PRN treatment(s):   Diagnostic bilateral lumbar facet block under fluoroscopic guidance and IV sedation    Provider-requested follow-up: Return in about 3 months (around 08/17/2016) for (MD) Med-Mgmt, in addition, (PRN) procedure.  Future Appointments Date Time Provider [place]  06/02/2016 8:00 AM  [name], MD [facility] None  06/03/2016 9:30  AM [facility] None  07/29/2016 9:00 AM [facility] None  07/29/2016 9:15 AM Sindy Guadeloupe, MD [facility] None  08/17/2016 10:30 AM [name], MD [facility] None  09/08/2016 10:45 AM Hollice Espy, MD [facility] None   Primary Care Physician: Jodi Marble, MD Location: [facility] Outpatient Pain Management Facility Note by: Kathlen Brunswick. Dossie Arbour, M.D, DABA, DABAPM, DABPM, DABIPP, FIPP Date: 05/20/2016; Time: 11:51 AM  Pain Score Disclaimer: We use the NRS-11 scale. This is a self-reported, subjective measurement of pain severity with only modest accuracy. It is used primarily to identify changes within a particular patient. It must be understood that outpatient pain scales are significantly less accurate that those used for research, where they can be applied under ideal controlled circumstances with minimal exposure to variables. In reality, the score is likely to be a combination of pain intensity and pain affect, where pain affect describes the degree of emotional arousal or changes in action readiness caused by the sensory experience of pain. Factors such as social and work situation, setting, emotional state, anxiety levels, expectation, and prior pain experience may influence pain perception and show large inter-individual differences that may also be affected by time variables.  Patient instructions provided during this appointment: Patient Instructions   Pain Score  Introduction: The pain score used by this practice is the Verbal Numerical Rating Scale (VNRS-11). This is an 11-point scale. It is for adults and children 10 years or older. There are significant differences in how the pain score is reported, used, and applied. Forget everything you learned in the past and learn this scoring system.  General Information: The scale should reflect your current level of pain. Unless you are specifically asked for the  level of your worst pain, or your average pain. If you are asked for one of these two, then it should be understood that it is over the past 24 hours.  Basic Activities of Daily Living (ADL): Personal hygiene, dressing, eating, transferring, and using restroom.  Instructions: Most patients tend to report their level of pain as a combination of two factors, their physical pain and their psychosocial pain. This last one is also known as suffering and it is reflection of how physical pain affects you socially and psychologically. From now on, report them separately. From this point on, when asked to report your pain level, report only your physical pain. Use the following table for reference.  Pain Clinic Pain Levels (0-5/10)  Pain Level Score Description  No Pain 0   Mild pain 1 Nagging, annoying, but does not interfere with basic activities of daily living (ADL). Patients are able to eat, bathe, get dressed, toileting (being able to get on and off the toilet and perform personal hygiene functions), transfer (move in and out of bed or a chair without assistance), and maintain continence (able to control bladder and bowel functions). Blood pressure and heart rate are unaffected. A normal heart rate for a healthy adult ranges from 60 to 100 bpm (beats per minute).   Mild to moderate pain 2 Noticeable and distracting. Impossible to hide from other people. More frequent flare-ups. Still possible to adapt and function close to normal. It can be very annoying and may have occasional stronger flare-ups. With discipline, patients may get used to it and adapt.   Moderate pain 3 Interferes significantly with activities of daily living (ADL). It becomes difficult to feed, bathe, get dressed, get on and off the toilet or to perform personal hygiene functions.  Difficult to get in and out of bed or a chair without assistance. Very distracting. With effort, it can be ignored when deeply involved in activities.    Moderately severe pain 4 Impossible to ignore for more than a few minutes. With effort, patients may still be able to manage work or participate in some social activities. Very difficult to concentrate. Signs of autonomic nervous system discharge are evident: dilated pupils (mydriasis); mild sweating (diaphoresis); sleep interference. Heart rate becomes elevated (>115 bpm). Diastolic blood pressure (lower number) rises above 100 mmHg. Patients find relief in laying down and not moving.   Severe pain 5 Intense and extremely unpleasant. Associated with frowning face and frequent crying. Pain overwhelms the senses.  Ability to do any activity or maintain social relationships becomes significantly limited. Conversation becomes difficult. Pacing back and forth is common, as getting into a comfortable position is nearly impossible. Pain wakes you up from deep sleep. Physical signs will be obvious: pupillary dilation; increased sweating; goosebumps; brisk reflexes; cold, clammy hands and feet; nausea, vomiting or dry heaves; loss of appetite; significant sleep disturbance with inability to fall asleep or to remain asleep. When persistent, significant weight loss is observed due to the complete loss of appetite and sleep deprivation.  Blood pressure and heart rate becomes significantly elevated. Caution: If elevated blood pressure triggers a pounding headache associated with blurred vision, then the patient should immediately seek attention at an urgent or emergency care unit, as these may be signs of an impending stroke.    Emergency Department Pain Levels (6-10/10)  Emergency Room Pain 6 Severely limiting. Requires emergency care and should not be seen or managed at an outpatient pain management facility. Communication becomes difficult and requires great effort. Assistance to reach the emergency department may be required. Facial flushing and profuse sweating along with potentially dangerous increases in heart  rate and blood pressure will be evident.   Distressing pain 7 Self-care is very difficult. Assistance is required to transport, or use restroom. Assistance to reach the emergency department will be required. Tasks requiring coordination, such as bathing and getting dressed become very difficult.   Disabling pain 8 Self-care is no longer possible. At this level, pain is disabling. The individual is unable to do even the most basic activities such as walking, eating, bathing, dressing, transferring to a bed, or toileting. Fine motor skills are lost. It is difficult to think clearly.   Incapacitating pain 9 Pain becomes incapacitating. Thought processing is no longer possible. Difficult to remember your own name. Control of movement and coordination are lost.   The worst pain imaginable 10 At this level, most patients pass out from pain. When this level is reached, collapse of the autonomic nervous system occurs, leading to a sudden drop in blood pressure and heart rate. This in turn results in a temporary and dramatic drop in blood flow to the brain, leading to a loss of consciousness. Fainting is one of the bodys self defense mechanisms. Passing out puts the brain in a calmed state and causes it to shut down for a while, in order to begin the healing process.    Summary: 1. Refer to this scale when providing [place] with your pain level. 2. Be accurate and careful when reporting your pain level. This will help with your care. 3. Over-reporting your pain level will lead to loss of credibility. 4. Even a level of 1/10 means that there is pain and will be treated at our facility. 5. High, inaccurate  reporting will be documented as Symptom Exaggeration, leading to loss of credibility and suspicions of possible secondary gains such as obtaining more narcotics, or wanting to appear disabled, for fraudulent reasons. 6. Only pain levels of 5 or below will be seen at our facility. 7. Pain levels of 6 and  above will be sent to the Emergency Department and the appointment cancelled. _____________________________________________________________________________________________  DRUG HOLIDAYS  Definitions Tolerance: defined as the progressively decreased responsiveness to a drug. Occurs when the drug is used repeatedly and the body adapts to the continued presence of the drug. As a result, a larger dose of the drug is needed to achieve the effect originally obtained by a smaller dose. It is thought to be due to the formation of excess opioid receptors.  Drug Holiday: is when a patient stops taking a medication(s) for a period of time; anywhere from a few days to several weeks.  Withdrawals: refers to the wide range of symptoms that occur after stopping or dramatically reducing opiate drugs after heavy and prolonged use. Withdrawal symptoms do not occur to patients that use low dose opioids, or those who take the medication sporadically. Contrary to benzodiazepine (example: Valium, Xanax, etc.) or alcohol withdrawals (Delirium Tremens), opioid withdrawals are not lethal. Withdrawals are the physical manifestation of the body getting rid of the excess receptors.  Purpose To eliminate tolerance.  Duration of Holiday 14 consecutive days. (2 weeks)  Expected Symptoms Early symptoms of withdrawal include:  Agitation  Anxiety  Muscle aches  Increased tearing  Insomnia  Runny nose  Sweating  Yawning  Late symptoms of withdrawal include:  Abdominal cramping  Diarrhea  Dilated pupils  Goose bumps  Nausea  Vomiting  Opioid withdrawal reactions are very uncomfortable but are not life-threatening. Symptoms usually start within 12 hours of last opioid dose and within 30 hours of last methadone exposure.  Duration of Symptoms 48 to 72 hours for short acting medications and 2 to 14 days for methadone.  Treatment  Clonidine (Catapres) or tizanidine (Zanaflex) for agitation,  sweating, tearing, runny nose.  Promethazine (Phenergan) for nausea, vomiting.  NSAIDs for pain.  Benefits  Improved effectiveness of opioids.  Decreased opioid dose needed to achieve benefits.  Improved pain with lesser dose.  GENERAL RISKS AND COMPLICATIONS  What are the risk, side effects and possible complications? Generally speaking, most procedures are safe.  However, with any procedure there are risks, side effects, and the possibility of complications.  The risks and complications are dependent upon the sites that are lesioned, or the type of nerve block to be performed.  The closer the procedure is to the spine, the more serious the risks are.  Great care is taken when placing the radio frequency needles, block needles or lesioning probes, but sometimes complications can occur. 1. Infection: Any time there is an injection through the skin, there is a risk of infection.  This is why sterile conditions are used for these blocks.  There are four possible types of infection. 1. Localized skin infection. 2. Central Nervous System Infection-This can be in the form of Meningitis, which can be deadly. 3. Epidural Infections-This can be in the form of an epidural abscess, which can cause pressure inside of the spine, causing compression of the spinal cord with subsequent paralysis. This would require an emergency surgery to decompress, and there are no guarantees that the patient would recover from the paralysis. 4. Discitis-This is an infection of the intervertebral discs.  It occurs in about 1%  of discography procedures.  It is difficult to treat and it may lead to surgery.        2. Pain: the needles have to go through skin and soft tissues, will cause soreness.       3. Damage to internal structures:  The nerves to be lesioned may be near blood vessels or    other nerves which can be potentially damaged.       4. Bleeding: Bleeding is more common if the patient is taking blood  thinners such as  aspirin, Coumadin, Ticiid, Plavix, etc., or if he/she have some genetic predisposition  such as hemophilia. Bleeding into the spinal canal can cause compression of the spinal  cord with subsequent paralysis.  This would require an emergency surgery to  decompress and there are no guarantees that the patient would recover from the  paralysis.       5. Pneumothorax:  Puncturing of a lung is a possibility, every time a needle is introduced in  the area of the chest or upper back.  Pneumothorax refers to free air around the  collapsed lung(s), inside of the thoracic cavity (chest cavity).  Another two possible  complications related to a similar event would include: Hemothorax and Chylothorax.   These are variations of the Pneumothorax, where instead of air around the collapsed  lung(s), you may have blood or chyle, respectively.       6. Spinal headaches: They may occur with any procedures in the area of the spine.       7. Persistent CSF (Cerebro-Spinal Fluid) leakage: This is a rare problem, but may occur  with prolonged intrathecal or epidural catheters either due to the formation of a fistulous  track or a dural tear.       8. Nerve damage: By working so close to the spinal cord, there is always a possibility of  nerve damage, which could be as serious as a permanent spinal cord injury with  paralysis.       9. Death:  Although rare, severe deadly allergic reactions known as "Anaphylactic  reaction" can occur to any of the medications used.      10. Worsening of the symptoms:  We can always make thing worse.  What are the chances of something like this happening? Chances of any of this occuring are extremely low.  By statistics, you have more of a chance of getting killed in a motor vehicle accident: while driving to the hospital than any of the above occurring .  Nevertheless, you should be aware that they are possibilities.  In general, it is similar to taking a shower.  Everybody knows  that you can slip, hit your head and get killed.  Does that mean that you should not shower again?  Nevertheless always keep in mind that statistics do not mean anything if you happen to be on the wrong side of them.  Even if a procedure has a 1 (one) in a 1,000,000 (million) chance of going wrong, it you happen to be that one..Also, keep in mind that by statistics, you have more of a chance of having something go wrong when taking medications.  Who should not have this procedure? If you are on a blood thinning medication (e.g. Coumadin, Plavix, see list of "Blood Thinners"), or if you have an active infection going on, you should not have the procedure.  If you are taking any blood thinners, please inform your physician.  How should I prepare  for this procedure?  Do not eat or drink anything at least six hours prior to the procedure.  Bring a driver with you .  It cannot be a taxi.  Come accompanied by an adult that can drive you back, and that is strong enough to help you if your legs get weak or numb from the local anesthetic.  Take all of your medicines the morning of the procedure with just enough water to swallow them.  If you have diabetes, make sure that you are scheduled to have your procedure done first thing in the morning, whenever possible.  If you have diabetes, take only half of your insulin dose and notify our nurse that you have done so as soon as you arrive at the clinic.  If you are diabetic, but only take blood sugar pills (oral hypoglycemic), then do not take them on the morning of your procedure.  You may take them after you have had the procedure.  Do not take aspirin or any aspirin-containing medications, at least eleven (11) days prior to the procedure.  They may prolong bleeding.  Wear loose fitting clothing that may be easy to take off and that you would not mind if it got stained with Betadine or blood.  Do not wear any jewelry or perfume  Remove any nail  coloring.  It will interfere with some of our monitoring equipment.  NOTE: Remember that this is not meant to be interpreted as a complete list of all possible complications.  Unforeseen problems may occur.  BLOOD THINNERS The following drugs contain aspirin or other products, which can cause increased bleeding during surgery and should not be taken for 2 weeks prior to and 1 week after surgery.  If you should need take something for relief of minor pain, you may take acetaminophen which is found in Tylenol,m Datril, Anacin-3 and Panadol. It is not blood thinner. The products listed below are.  Do not take any of the products listed below in addition to any listed on your instruction sheet.  A.P.C or A.P.C with Codeine Codeine Phosphate Capsules #3 Ibuprofen Ridaura  ABC compound Congesprin Imuran rimadil  Advil Cope Indocin Robaxisal  Alka-Seltzer Effervescent Pain Reliever and Antacid Coricidin or Coricidin-D  Indomethacin Rufen  Alka-Seltzer plus Cold Medicine Cosprin Ketoprofen S-A-C Tablets  Anacin Analgesic Tablets or Capsules Coumadin Korlgesic Salflex  Anacin Extra Strength Analgesic tablets or capsules CP-2 Tablets Lanoril Salicylate  Anaprox Cuprimine Capsules Levenox Salocol  Anexsia-D Dalteparin Magan Salsalate  Anodynos Darvon compound Magnesium Salicylate Sine-off  Ansaid Dasin Capsules Magsal Sodium Salicylate  Anturane Depen Capsules Marnal Soma  APF Arthritis pain formula Dewitt's Pills Measurin Stanback  Argesic Dia-Gesic Meclofenamic Sulfinpyrazone  Arthritis Bayer Timed Release Aspirin Diclofenac Meclomen Sulindac  Arthritis pain formula Anacin Dicumarol Medipren Supac  Analgesic (Safety coated) Arthralgen Diffunasal Mefanamic Suprofen  Arthritis Strength Bufferin Dihydrocodeine Mepro Compound Suprol  Arthropan liquid Dopirydamole Methcarbomol with Aspirin Synalgos  ASA tablets/Enseals Disalcid Micrainin Tagament  Ascriptin Doan's Midol Talwin  Ascriptin A/D Dolene  Mobidin Tanderil  Ascriptin Extra Strength Dolobid Moblgesic Ticlid  Ascriptin with Codeine Doloprin or Doloprin with Codeine Momentum Tolectin  Asperbuf Duoprin Mono-gesic Trendar  Aspergum Duradyne Motrin or Motrin IB Triminicin  Aspirin plain, buffered or enteric coated Durasal Myochrisine Trigesic  Aspirin Suppositories Easprin Nalfon Trillsate  Aspirin with Codeine Ecotrin Regular or Extra Strength Naprosyn Uracel  Atromid-S Efficin Naproxen Ursinus  Auranofin Capsules Elmiron Neocylate Vanquish  Axotal Emagrin Norgesic Verin  Azathioprine Empirin or Empirin with  Codeine Normiflo Vitamin E  Azolid Emprazil Nuprin Voltaren  Bayer Aspirin plain, buffered or children's or timed BC Tablets or powders Encaprin Orgaran Warfarin Sodium  Buff-a-Comp Enoxaparin Orudis Zorpin  Buff-a-Comp with Codeine Equegesic Os-Cal-Gesic   Buffaprin Excedrin plain, buffered or Extra Strength Oxalid   Bufferin Arthritis Strength Feldene Oxphenbutazone   Bufferin plain or Extra Strength Feldene Capsules Oxycodone with Aspirin   Bufferin with Codeine Fenoprofen Fenoprofen Pabalate or Pabalate-SF   Buffets II Flogesic Panagesic   Buffinol plain or Extra Strength Florinal or Florinal with Codeine Panwarfarin   Buf-Tabs Flurbiprofen Penicillamine   Butalbital Compound Four-way cold tablets Penicillin   Butazolidin Fragmin Pepto-Bismol   Carbenicillin Geminisyn Percodan   Carna Arthritis Reliever Geopen Persantine   Carprofen Gold's salt Persistin   Chloramphenicol Goody's Phenylbutazone   Chloromycetin Haltrain Piroxlcam   Clmetidine heparin Plaquenil   Cllnoril Hyco-pap Ponstel   Clofibrate Hydroxy chloroquine Propoxyphen         Before stopping any of these medications, be sure to consult the physician who ordered them.  Some, such as Coumadin (Warfarin) are ordered to prevent or treat serious conditions such as "deep thrombosis", "pumonary embolisms", and other heart problems.  The amount of time that  you may need off of the medication may also vary with the medication and the reason for which you were taking it.  If you are taking any of these medications, please make sure you notify your pain physician before you undergo any procedures.         Facet Blocks Patient Information  Description: The facets are joints in the spine between the vertebrae.  Like any joints in the body, facets can become irritated and painful.  Arthritis can also effect the facets.  By injecting steroids and local anesthetic in and around these joints, we can temporarily block the nerve supply to them.  Steroids act directly on irritated nerves and tissues to reduce selling and inflammation which often leads to decreased pain.  Facet blocks may be done anywhere along the spine from the neck to the low back depending upon the location of your pain.   After numbing the skin with local anesthetic (like Novocaine), a small needle is passed onto the facet joints under x-ray guidance.  You may experience a sensation of pressure while this is being done.  The entire block usually lasts about 15-25 minutes.   Conditions which may be treated by facet blocks:   Low back/buttock pain  Neck/shoulder pain  Certain types of headaches  Preparation for the injection:  1. Do not eat any solid food or dairy products within 8 hours of your appointment. 2. You may drink clear liquid up to 3 hours before appointment.  Clear liquids include water, black coffee, juice or soda.  No milk or cream please. 3. You may take your regular medication, including pain medications, with a sip of water before your appointment.  Diabetics should hold regular insulin (if taken separately) and take 1/2 normal NPH dose the morning of the procedure.  Carry some sugar containing items with you to your appointment. 4. A driver must accompany you and be prepared to drive you home after your procedure. 5. Bring all your current medications with  you. 6. An IV may be inserted and sedation may be given at the discretion of the physician. 7. A blood pressure cuff, EKG and other monitors will often be applied during the procedure.  Some patients may need to have extra oxygen administered for  a short period. 8. You will be asked to provide medical information, including your allergies and medications, prior to the procedure.  We must know immediately if you are taking blood thinners (like Coumadin/Warfarin) or if you are allergic to IV iodine contrast (dye).  We must know if you could possible be pregnant.  Possible side-effects:   Bleeding from needle site  Infection (rare, may require surgery)  Nerve injury (rare)  Numbness & tingling (temporary)  Difficulty urinating (rare, temporary)  Spinal headache (a headache worse with upright posture)  Light-headedness (temporary)  Pain at injection site (serveral days)  Decreased blood pressure (rare, temporary)  Weakness in arm/leg (temporary)  Pressure sensation in back/neck (temporary)   Call if you experience:   Fever/chills associated with headache or increased back/neck pain  Headache worsened by an upright position  New onset, weakness or numbness of an extremity below the injection site  Hives or difficulty breathing (go to the emergency room)  Inflammation or drainage at the injection site(s)  Severe back/neck pain greater than usual  New symptoms which are concerning to you  Please note:  Although the local anesthetic injected can often make your back or neck feel good for several hours after the injection, the pain will likely return. It takes 3-7 days for steroids to work.  You may not notice any pain relief for at least one week.  If effective, we will often do a series of 2-3 injections spaced 3-6 weeks apart to maximally decrease your pain.  After the initial series, you may be a candidate for a more permanent nerve block of the facets.  If you have  any questions, please call #[phone] [facility]

## 2016-05-20 NOTE — Progress Notes (Signed)
Nursing Pain Medication Assessment:  Safety precautions to be maintained throughout the outpatient stay will include: orient to surroundings, keep bed in low position, maintain call bell within reach at all times, provide assistance with transfer out of bed and ambulation.  Medication Inspection Compliance: Pill count conducted under aseptic conditions, in front of the patient. Neither the pills nor the bottle was removed from the patient's sight at any time. Once count was completed pills were immediately returned to the patient in their original bottle.  Medication: Oxycodone IR Pill/Patch Count: 102 of 120 pills remain Bottle Appearance: Standard pharmacy container. Clearly labeled. Filled Date: 02 / 17/ 2018 Last Medication intake:  Today

## 2016-05-20 NOTE — Patient Instructions (Addendum)
Pain Score  Introduction: The pain score used by this practice is the Verbal Numerical Rating Scale (VNRS-11). This is an 11-point scale. It is for adults and children 10 years or older. There are significant differences in how the pain score is reported, used, and applied. Forget everything you learned in the past and learn this scoring system.  General Information: The scale should reflect your current level of pain. Unless you are specifically asked for the level of your worst pain, or your average pain. If you are asked for one of these two, then it should be understood that it is over the past 24 hours.  Basic Activities of Daily Living (ADL): Personal hygiene, dressing, eating, transferring, and using restroom.  Instructions: Most patients tend to report their level of pain as a combination of two factors, their physical pain and their psychosocial pain. This last one is also known as suffering and it is reflection of how physical pain affects you socially and psychologically. From now on, report them separately. From this point on, when asked to report your pain level, report only your physical pain. Use the following table for reference.  Pain Clinic Pain Levels (0-5/10)  Pain Level Score Description  No Pain 0   Mild pain 1 Nagging, annoying, but does not interfere with basic activities of daily living (ADL). Patients are able to eat, bathe, get dressed, toileting (being able to get on and off the toilet and perform personal hygiene functions), transfer (move in and out of bed or a chair without assistance), and maintain continence (able to control bladder and bowel functions). Blood pressure and heart rate are unaffected. A normal heart rate for a healthy adult ranges from 60 to 100 bpm (beats per minute).   Mild to moderate pain 2 Noticeable and distracting. Impossible to hide from other people. More frequent flare-ups. Still possible to adapt and function close to normal. It can be very  annoying and may have occasional stronger flare-ups. With discipline, patients may get used to it and adapt.   Moderate pain 3 Interferes significantly with activities of daily living (ADL). It becomes difficult to feed, bathe, get dressed, get on and off the toilet or to perform personal hygiene functions. Difficult to get in and out of bed or a chair without assistance. Very distracting. With effort, it can be ignored when deeply involved in activities.   Moderately severe pain 4 Impossible to ignore for more than a few minutes. With effort, patients may still be able to manage work or participate in some social activities. Very difficult to concentrate. Signs of autonomic nervous system discharge are evident: dilated pupils (mydriasis); mild sweating (diaphoresis); sleep interference. Heart rate becomes elevated (>115 bpm). Diastolic blood pressure (lower number) rises above 100 mmHg. Patients find relief in laying down and not moving.   Severe pain 5 Intense and extremely unpleasant. Associated with frowning face and frequent crying. Pain overwhelms the senses.  Ability to do any activity or maintain social relationships becomes significantly limited. Conversation becomes difficult. Pacing back and forth is common, as getting into a comfortable position is nearly impossible. Pain wakes you up from deep sleep. Physical signs will be obvious: pupillary dilation; increased sweating; goosebumps; brisk reflexes; cold, clammy hands and feet; nausea, vomiting or dry heaves; loss of appetite; significant sleep disturbance with inability to fall asleep or to remain asleep. When persistent, significant weight loss is observed due to the complete loss of appetite and sleep deprivation.  Blood pressure and heart  rate becomes significantly elevated. Caution: If elevated blood pressure triggers a pounding headache associated with blurred vision, then the patient should immediately seek attention at an urgent or  emergency care unit, as these may be signs of an impending stroke.    Emergency Department Pain Levels (6-10/10)  Emergency Room Pain 6 Severely limiting. Requires emergency care and should not be seen or managed at an outpatient pain management facility. Communication becomes difficult and requires great effort. Assistance to reach the emergency department may be required. Facial flushing and profuse sweating along with potentially dangerous increases in heart rate and blood pressure will be evident.   Distressing pain 7 Self-care is very difficult. Assistance is required to transport, or use restroom. Assistance to reach the emergency department will be required. Tasks requiring coordination, such as bathing and getting dressed become very difficult.   Disabling pain 8 Self-care is no longer possible. At this level, pain is disabling. The individual is unable to do even the most basic activities such as walking, eating, bathing, dressing, transferring to a bed, or toileting. Fine motor skills are lost. It is difficult to think clearly.   Incapacitating pain 9 Pain becomes incapacitating. Thought processing is no longer possible. Difficult to remember your own name. Control of movement and coordination are lost.   The worst pain imaginable 10 At this level, most patients pass out from pain. When this level is reached, collapse of the autonomic nervous system occurs, leading to a sudden drop in blood pressure and heart rate. This in turn results in a temporary and dramatic drop in blood flow to the brain, leading to a loss of consciousness. Fainting is one of the bodys self defense mechanisms. Passing out puts the brain in a calmed state and causes it to shut down for a while, in order to begin the healing process.    Summary: 1. Refer to this scale when providing [place] with your pain level. 2. Be accurate and careful when reporting your pain level. This will help with your care. 3. Over-reporting  your pain level will lead to loss of credibility. 4. Even a level of 1/10 means that there is pain and will be treated at our facility. 5. High, inaccurate reporting will be documented as Symptom Exaggeration, leading to loss of credibility and suspicions of possible secondary gains such as obtaining more narcotics, or wanting to appear disabled, for fraudulent reasons. 6. Only pain levels of 5 or below will be seen at our facility. 7. Pain levels of 6 and above will be sent to the Emergency Department and the appointment cancelled. _____________________________________________________________________________________________  DRUG HOLIDAYS  Definitions Tolerance: defined as the progressively decreased responsiveness to a drug. Occurs when the drug is used repeatedly and the body adapts to the continued presence of the drug. As a result, a larger dose of the drug is needed to achieve the effect originally obtained by a smaller dose. It is thought to be due to the formation of excess opioid receptors.  Drug Holiday: is when a patient stops taking a medication(s) for a period of time; anywhere from a few days to several weeks.  Withdrawals: refers to the wide range of symptoms that occur after stopping or dramatically reducing opiate drugs after heavy and prolonged use. Withdrawal symptoms do not occur to patients that use low dose opioids, or those who take the medication sporadically. Contrary to benzodiazepine (example: Valium, Xanax, etc.) or alcohol withdrawals (Delirium Tremens), opioid withdrawals are not lethal. Withdrawals are the physical  manifestation of the body getting rid of the excess receptors.  Purpose To eliminate tolerance.  Duration of Holiday 14 consecutive days. (2 weeks)  Expected Symptoms Early symptoms of withdrawal include:  Agitation  Anxiety  Muscle aches  Increased tearing  Insomnia  Runny nose  Sweating  Yawning  Late symptoms of withdrawal  include:  Abdominal cramping  Diarrhea  Dilated pupils  Goose bumps  Nausea  Vomiting  Opioid withdrawal reactions are very uncomfortable but are not life-threatening. Symptoms usually start within 12 hours of last opioid dose and within 30 hours of last methadone exposure.  Duration of Symptoms 48 to 72 hours for short acting medications and 2 to 14 days for methadone.  Treatment  Clonidine (Catapres) or tizanidine (Zanaflex) for agitation, sweating, tearing, runny nose.  Promethazine (Phenergan) for nausea, vomiting.  NSAIDs for pain.  Benefits  Improved effectiveness of opioids.  Decreased opioid dose needed to achieve benefits.  Improved pain with lesser dose.  GENERAL RISKS AND COMPLICATIONS  What are the risk, side effects and possible complications? Generally speaking, most procedures are safe.  However, with any procedure there are risks, side effects, and the possibility of complications.  The risks and complications are dependent upon the sites that are lesioned, or the type of nerve block to be performed.  The closer the procedure is to the spine, the more serious the risks are.  Great care is taken when placing the radio frequency needles, block needles or lesioning probes, but sometimes complications can occur. 1. Infection: Any time there is an injection through the skin, there is a risk of infection.  This is why sterile conditions are used for these blocks.  There are four possible types of infection. 1. Localized skin infection. 2. Central Nervous System Infection-This can be in the form of Meningitis, which can be deadly. 3. Epidural Infections-This can be in the form of an epidural abscess, which can cause pressure inside of the spine, causing compression of the spinal cord with subsequent paralysis. This would require an emergency surgery to decompress, and there are no guarantees that the patient would recover from the paralysis. 4. Discitis-This is  an infection of the intervertebral discs.  It occurs in about 1% of discography procedures.  It is difficult to treat and it may lead to surgery.        2. Pain: the needles have to go through skin and soft tissues, will cause soreness.       3. Damage to internal structures:  The nerves to be lesioned may be near blood vessels or    other nerves which can be potentially damaged.       4. Bleeding: Bleeding is more common if the patient is taking blood thinners such as  aspirin, Coumadin, Ticiid, Plavix, etc., or if he/she have some genetic predisposition  such as hemophilia. Bleeding into the spinal canal can cause compression of the spinal  cord with subsequent paralysis.  This would require an emergency surgery to  decompress and there are no guarantees that the patient would recover from the  paralysis.       5. Pneumothorax:  Puncturing of a lung is a possibility, every time a needle is introduced in  the area of the chest or upper back.  Pneumothorax refers to free air around the  collapsed lung(s), inside of the thoracic cavity (chest cavity).  Another two possible  complications related to a similar event would include: Hemothorax and Chylothorax.   These are  variations of the Pneumothorax, where instead of air around the collapsed  lung(s), you may have blood or chyle, respectively.       6. Spinal headaches: They may occur with any procedures in the area of the spine.       7. Persistent CSF (Cerebro-Spinal Fluid) leakage: This is a rare problem, but may occur  with prolonged intrathecal or epidural catheters either due to the formation of a fistulous  track or a dural tear.       8. Nerve damage: By working so close to the spinal cord, there is always a possibility of  nerve damage, which could be as serious as a permanent spinal cord injury with  paralysis.       9. Death:  Although rare, severe deadly allergic reactions known as "Anaphylactic  reaction" can occur to any of the medications  used.      10. Worsening of the symptoms:  We can always make thing worse.  What are the chances of something like this happening? Chances of any of this occuring are extremely low.  By statistics, you have more of a chance of getting killed in a motor vehicle accident: while driving to the hospital than any of the above occurring .  Nevertheless, you should be aware that they are possibilities.  In general, it is similar to taking a shower.  Everybody knows that you can slip, hit your head and get killed.  Does that mean that you should not shower again?  Nevertheless always keep in mind that statistics do not mean anything if you happen to be on the wrong side of them.  Even if a procedure has a 1 (one) in a 1,000,000 (million) chance of going wrong, it you happen to be that one..Also, keep in mind that by statistics, you have more of a chance of having something go wrong when taking medications.  Who should not have this procedure? If you are on a blood thinning medication (e.g. Coumadin, Plavix, see list of "Blood Thinners"), or if you have an active infection going on, you should not have the procedure.  If you are taking any blood thinners, please inform your physician.  How should I prepare for this procedure?  Do not eat or drink anything at least six hours prior to the procedure.  Bring a driver with you .  It cannot be a taxi.  Come accompanied by an adult that can drive you back, and that is strong enough to help you if your legs get weak or numb from the local anesthetic.  Take all of your medicines the morning of the procedure with just enough water to swallow them.  If you have diabetes, make sure that you are scheduled to have your procedure done first thing in the morning, whenever possible.  If you have diabetes, take only half of your insulin dose and notify our nurse that you have done so as soon as you arrive at the clinic.  If you are diabetic, but only take blood sugar  pills (oral hypoglycemic), then do not take them on the morning of your procedure.  You may take them after you have had the procedure.  Do not take aspirin or any aspirin-containing medications, at least eleven (11) days prior to the procedure.  They may prolong bleeding.  Wear loose fitting clothing that may be easy to take off and that you would not mind if it got stained with Betadine or blood.  Do not  wear any jewelry or perfume  Remove any nail coloring.  It will interfere with some of our monitoring equipment.  NOTE: Remember that this is not meant to be interpreted as a complete list of all possible complications.  Unforeseen problems may occur.  BLOOD THINNERS The following drugs contain aspirin or other products, which can cause increased bleeding during surgery and should not be taken for 2 weeks prior to and 1 week after surgery.  If you should need take something for relief of minor pain, you may take acetaminophen which is found in Tylenol,m Datril, Anacin-3 and Panadol. It is not blood thinner. The products listed below are.  Do not take any of the products listed below in addition to any listed on your instruction sheet.  A.P.C or A.P.C with Codeine Codeine Phosphate Capsules #3 Ibuprofen Ridaura  ABC compound Congesprin Imuran rimadil  Advil Cope Indocin Robaxisal  Alka-Seltzer Effervescent Pain Reliever and Antacid Coricidin or Coricidin-D  Indomethacin Rufen  Alka-Seltzer plus Cold Medicine Cosprin Ketoprofen S-A-C Tablets  Anacin Analgesic Tablets or Capsules Coumadin Korlgesic Salflex  Anacin Extra Strength Analgesic tablets or capsules CP-2 Tablets Lanoril Salicylate  Anaprox Cuprimine Capsules Levenox Salocol  Anexsia-D Dalteparin Magan Salsalate  Anodynos Darvon compound Magnesium Salicylate Sine-off  Ansaid Dasin Capsules Magsal Sodium Salicylate  Anturane Depen Capsules Marnal Soma  APF Arthritis pain formula Dewitt's Pills Measurin Stanback  Argesic Dia-Gesic  Meclofenamic Sulfinpyrazone  Arthritis Bayer Timed Release Aspirin Diclofenac Meclomen Sulindac  Arthritis pain formula Anacin Dicumarol Medipren Supac  Analgesic (Safety coated) Arthralgen Diffunasal Mefanamic Suprofen  Arthritis Strength Bufferin Dihydrocodeine Mepro Compound Suprol  Arthropan liquid Dopirydamole Methcarbomol with Aspirin Synalgos  ASA tablets/Enseals Disalcid Micrainin Tagament  Ascriptin Doan's Midol Talwin  Ascriptin A/D Dolene Mobidin Tanderil  Ascriptin Extra Strength Dolobid Moblgesic Ticlid  Ascriptin with Codeine Doloprin or Doloprin with Codeine Momentum Tolectin  Asperbuf Duoprin Mono-gesic Trendar  Aspergum Duradyne Motrin or Motrin IB Triminicin  Aspirin plain, buffered or enteric coated Durasal Myochrisine Trigesic  Aspirin Suppositories Easprin Nalfon Trillsate  Aspirin with Codeine Ecotrin Regular or Extra Strength Naprosyn Uracel  Atromid-S Efficin Naproxen Ursinus  Auranofin Capsules Elmiron Neocylate Vanquish  Axotal Emagrin Norgesic Verin  Azathioprine Empirin or Empirin with Codeine Normiflo Vitamin E  Azolid Emprazil Nuprin Voltaren  Bayer Aspirin plain, buffered or children's or timed BC Tablets or powders Encaprin Orgaran Warfarin Sodium  Buff-a-Comp Enoxaparin Orudis Zorpin  Buff-a-Comp with Codeine Equegesic Os-Cal-Gesic   Buffaprin Excedrin plain, buffered or Extra Strength Oxalid   Bufferin Arthritis Strength Feldene Oxphenbutazone   Bufferin plain or Extra Strength Feldene Capsules Oxycodone with Aspirin   Bufferin with Codeine Fenoprofen Fenoprofen Pabalate or Pabalate-SF   Buffets II Flogesic Panagesic   Buffinol plain or Extra Strength Florinal or Florinal with Codeine Panwarfarin   Buf-Tabs Flurbiprofen Penicillamine   Butalbital Compound Four-way cold tablets Penicillin   Butazolidin Fragmin Pepto-Bismol   Carbenicillin Geminisyn Percodan   Carna Arthritis Reliever Geopen Persantine   Carprofen Gold's salt Persistin    Chloramphenicol Goody's Phenylbutazone   Chloromycetin Haltrain Piroxlcam   Clmetidine heparin Plaquenil   Cllnoril Hyco-pap Ponstel   Clofibrate Hydroxy chloroquine Propoxyphen         Before stopping any of these medications, be sure to consult the physician who ordered them.  Some, such as Coumadin (Warfarin) are ordered to prevent or treat serious conditions such as "deep thrombosis", "pumonary embolisms", and other heart problems.  The amount of time that you may need off of the medication may  also vary with the medication and the reason for which you were taking it.  If you are taking any of these medications, please make sure you notify your pain physician before you undergo any procedures.         Facet Blocks Patient Information  Description: The facets are joints in the spine between the vertebrae.  Like any joints in the body, facets can become irritated and painful.  Arthritis can also effect the facets.  By injecting steroids and local anesthetic in and around these joints, we can temporarily block the nerve supply to them.  Steroids act directly on irritated nerves and tissues to reduce selling and inflammation which often leads to decreased pain.  Facet blocks may be done anywhere along the spine from the neck to the low back depending upon the location of your pain.   After numbing the skin with local anesthetic (like Novocaine), a small needle is passed onto the facet joints under x-ray guidance.  You may experience a sensation of pressure while this is being done.  The entire block usually lasts about 15-25 minutes.   Conditions which may be treated by facet blocks:   Low back/buttock pain  Neck/shoulder pain  Certain types of headaches  Preparation for the injection:  1. Do not eat any solid food or dairy products within 8 hours of your appointment. 2. You may drink clear liquid up to 3 hours before appointment.  Clear liquids include water, black coffee, juice  or soda.  No milk or cream please. 3. You may take your regular medication, including pain medications, with a sip of water before your appointment.  Diabetics should hold regular insulin (if taken separately) and take 1/2 normal NPH dose the morning of the procedure.  Carry some sugar containing items with you to your appointment. 4. A driver must accompany you and be prepared to drive you home after your procedure. 5. Bring all your current medications with you. 6. An IV may be inserted and sedation may be given at the discretion of the physician. 7. A blood pressure cuff, EKG and other monitors will often be applied during the procedure.  Some patients may need to have extra oxygen administered for a short period. 8. You will be asked to provide medical information, including your allergies and medications, prior to the procedure.  We must know immediately if you are taking blood thinners (like Coumadin/Warfarin) or if you are allergic to IV iodine contrast (dye).  We must know if you could possible be pregnant.  Possible side-effects:   Bleeding from needle site  Infection (rare, may require surgery)  Nerve injury (rare)  Numbness & tingling (temporary)  Difficulty urinating (rare, temporary)  Spinal headache (a headache worse with upright posture)  Light-headedness (temporary)  Pain at injection site (serveral days)  Decreased blood pressure (rare, temporary)  Weakness in arm/leg (temporary)  Pressure sensation in back/neck (temporary)   Call if you experience:   Fever/chills associated with headache or increased back/neck pain  Headache worsened by an upright position  New onset, weakness or numbness of an extremity below the injection site  Hives or difficulty breathing (go to the emergency room)  Inflammation or drainage at the injection site(s)  Severe back/neck pain greater than usual  New symptoms which are concerning to you  Please note:  Although the  local anesthetic injected can often make your back or neck feel good for several hours after the injection, the pain will likely return. It  takes 3-7 days for steroids to work.  You may not notice any pain relief for at least one week.  If effective, we will often do a series of 2-3 injections spaced 3-6 weeks apart to maximally decrease your pain.  After the initial series, you may be a candidate for a more permanent nerve block of the facets.  If you have any questions, please call #[phone] [facility]

## 2016-05-26 ENCOUNTER — Telehealth: Payer: Self-pay

## 2016-05-26 NOTE — Telephone Encounter (Signed)
Pt is having leg pain instead of back pain. Pt was here on 2/22 She told Dr Lowella Dandy her back was hurting. Will this affect the procedure she is scheduled for on 3/7 since her pain is different? Pt said she should have told him it was her legs instead. Please call pt with more information on what she should do.

## 2016-05-28 LAB — TOXASSURE SELECT 13 (MW), URINE

## 2016-05-28 NOTE — Telephone Encounter (Signed)
Spoke with patient re; procedure on 06/02/16 and her concern about where her pain is and possibly not having communicated well to Dr Dossie Arbour.  Patients insurance does not require a PA.  Explained to patient that she can talk to Dr Dossie Arbour on the day of procedure and if he needs to change it based her c/o pain and location of pain that would be an option.  Patient verbalizes u/o information.

## 2016-06-02 ENCOUNTER — Ambulatory Visit: Payer: 59 | Admitting: Pain Medicine

## 2016-06-03 ENCOUNTER — Inpatient Hospital Stay: Payer: Medicare Other

## 2016-06-09 ENCOUNTER — Ambulatory Visit (HOSPITAL_BASED_OUTPATIENT_CLINIC_OR_DEPARTMENT_OTHER): Payer: Medicare Other | Admitting: Pain Medicine

## 2016-06-09 ENCOUNTER — Ambulatory Visit
Admission: RE | Admit: 2016-06-09 | Discharge: 2016-06-09 | Disposition: A | Discharge status: Home / Self Care | Payer: Medicare Other | Source: Ambulatory Visit | Attending: Pain Medicine | Admitting: Pain Medicine

## 2016-06-09 ENCOUNTER — Encounter: Payer: Self-pay | Admitting: Pain Medicine

## 2016-06-09 VITALS — BP 93/54 | HR 64 | Temp 98.9°F | Resp 13 | Ht 70.0 in | Wt 151.0 lb

## 2016-06-09 DIAGNOSIS — M1288 Other specific arthropathies, not elsewhere classified, other specified site: Secondary | ICD-10-CM | POA: Insufficient documentation

## 2016-06-09 DIAGNOSIS — M4310 Spondylolisthesis, site unspecified: Secondary | ICD-10-CM

## 2016-06-09 DIAGNOSIS — M5386 Other specified dorsopathies, lumbar region: Secondary | ICD-10-CM

## 2016-06-09 DIAGNOSIS — M4312 Spondylolisthesis, cervical region: Secondary | ICD-10-CM | POA: Diagnosis not present

## 2016-06-09 DIAGNOSIS — M47816 Spondylosis without myelopathy or radiculopathy, lumbar region: Secondary | ICD-10-CM

## 2016-06-09 DIAGNOSIS — M5442 Lumbago with sciatica, left side: Secondary | ICD-10-CM

## 2016-06-09 DIAGNOSIS — M47817 Spondylosis without myelopathy or radiculopathy, lumbosacral region: Secondary | ICD-10-CM | POA: Diagnosis not present

## 2016-06-09 DIAGNOSIS — G8929 Other chronic pain: Secondary | ICD-10-CM | POA: Insufficient documentation

## 2016-06-09 DIAGNOSIS — M545 Low back pain: Secondary | ICD-10-CM | POA: Diagnosis not present

## 2016-06-09 MED ORDER — TRIAMCINOLONE ACETONIDE 40 MG/ML IJ SUSP
40.0000 mg | Freq: Once | INTRAMUSCULAR | Status: AC
  Administered 2016-06-09: 40 mg
  Filled 2016-06-09: qty 1

## 2016-06-09 MED ORDER — ROPIVACAINE HCL 5 MG/ML IJ SOLN
5.0000 mL | Freq: Once | INTRAMUSCULAR | Status: AC
  Administered 2016-06-09: 20 mL via PERINEURAL

## 2016-06-09 MED ORDER — FENTANYL CITRATE (PF) 100 MCG/2ML IJ SOLN
25.0000 ug | INTRAMUSCULAR | Status: DC | PRN
  Administered 2016-06-09: 50 ug via INTRAVENOUS
  Filled 2016-06-09: qty 2

## 2016-06-09 MED ORDER — LIDOCAINE HCL (PF) 1 % IJ SOLN: 10.0000 mL | Freq: Once | INTRAMUSCULAR | Status: DC

## 2016-06-09 MED ORDER — ROPIVACAINE HCL 5 MG/ML IJ SOLN
5.0000 mL | Freq: Once | INTRAMUSCULAR | Status: AC
  Administered 2016-06-09: 20 mL via PERINEURAL
  Filled 2016-06-09: qty 20

## 2016-06-09 MED ORDER — LACTATED RINGERS IV SOLN
1000.0000 mL | Freq: Once | INTRAVENOUS | Status: AC
  Administered 2016-06-09: 1000 mL via INTRAVENOUS

## 2016-06-09 MED ORDER — MIDAZOLAM HCL 5 MG/5ML IJ SOLN
1.0000 mg | INTRAMUSCULAR | Status: DC | PRN
  Administered 2016-06-09: 2 mg via INTRAVENOUS
  Filled 2016-06-09: qty 5

## 2016-06-09 NOTE — Progress Notes (Signed)
Safety precautions to be maintained throughout the outpatient stay will include: orient to surroundings, keep bed in low position, maintain call bell within reach at all times, provide assistance with transfer out of bed and ambulation.  

## 2016-06-09 NOTE — Patient Instructions (Addendum)

## 2016-06-09 NOTE — Progress Notes (Signed)
[name] Name: Audrey Garrett  MRN: [id number]  Referring Provider: [name], MD  DOB: [birth year]  PCP: Jodi Marble, MD  DOS: 06/09/2016  Note by: Kathlen Brunswick. Dossie Arbour, MD  Service setting: Ambulatory outpatient  Location: [facility] (AMB) Pain Management Facility  Visit type: Procedure  Specialty: Interventional Pain Management  Patient type: Established   Primary Reason for Visit: Interventional Pain Management Treatment. CC: Back Pain (lower)  Procedure:  Anesthesia, Analgesia, Anxiolysis:  Type: Diagnostic Medial Branch Facet Block Region: Lumbar Level: L2, L3, L4, L5, & S1 Medial Branch Level(s) Laterality: Bilateral  Type: Local Anesthesia with Moderate (Conscious) Sedation Local Anesthetic: Lidocaine 1% Route: Intravenous (IV) IV Access: Secured Sedation: Meaningful verbal contact was maintained at all times during the procedure  Indication(s): Analgesia and Anxiety  Indications: 1. Lumbar facet syndrome (Bilateral)   2. Chronic low back pain (Location of Primary Source of Pain) (Bilateral) (L>R)   3. Grade 1 Retrolisthesis (4 mm) of C5 over C6 and C6 over C7   4. Lumbar spondylosis    Pain Score: Pre-procedure: 4 /10 Post-procedure: 0-No pain/10  Pre-op Assessment:  Previous date of service: 05/20/16 Service provided: Med Refill Ms. Floyd is a 56 y.o. (year old), female patient, seen today for interventional treatment. She  has a past surgical history that includes Tubal ligation; Tonsillectomy; Back surgery (12/19/2011); Esophagogastroduodenoscopy (N/A, 04/14/2015); Esophagogastroduodenoscopy (egd) with propofol (N/A, 09/16/2015); Colonoscopy with propofol (N/A, 09/25/2015); Umbilical hernia repair (N/A, 11/06/2015); Hernia repair (N/A, 10/2015); Esophagogastroduodenoscopy (egd) with propofol (N/A, 01/22/2016); Back surgery; Colonoscopy with propofol (N/A, 04/01/2016); and Esophagogastroduodenoscopy (egd) with propofol (N/A, 04/01/2016). Her primarily concern today is the Back  Pain (lower)  Initial Vital Signs: Blood pressure (!) 97/58, pulse 64, temperature 98.9 F (37.2 C), temperature source Oral, resp. rate 16, height '5\' 10"'$  (1.778 m), weight 151 lb (68.5 kg), SpO2 100 %. BMI: 21.67 kg/m  Risk Assessment: Allergies: Reviewed. She has No Known Allergies.  Allergy Precautions: None required Coagulopathies: "Reviewed. None identified.  Blood-thinner therapy: None at this time Active Infection(s): Reviewed. None identified. Ms. Owen is afebrile  Site Confirmation: Ms. Usery was asked to confirm the procedure and laterality before marking the site Procedure checklist: Completed Consent: Before the procedure and under the influence of no sedative(s), amnesic(s), or anxiolytics, the patient was informed of the treatment options, risks and possible complications. To fulfill our ethical and legal obligations, as recommended by the American Medical Association's Code of Ethics, I have informed the patient of my clinical impression; the nature and purpose of the treatment or procedure; the risks, benefits, and possible complications of the intervention; the alternatives, including doing nothing; the risk(s) and benefit(s) of the alternative treatment(s) or procedure(s); and the risk(s) and benefit(s) of doing nothing. The patient was provided information about the general risks and possible complications associated with the procedure. These may include, but are not limited to: failure to achieve desired goals, infection, bleeding, organ or nerve damage, allergic reactions, paralysis, and death. In addition, the patient was informed of those risks and complications associated to Spine-related procedures, such as failure to decrease pain; infection (i.e.: Meningitis, epidural or intraspinal abscess); bleeding (i.e.: epidural hematoma, subarachnoid hemorrhage, or any other type of intraspinal or peri-dural bleeding); organ or nerve damage (i.e.: Any type of peripheral nerve,  nerve root, or spinal cord injury) with subsequent damage to sensory, motor, and/or autonomic systems, resulting in permanent pain, numbness, and/or weakness of one or several areas of the body; allergic reactions; (i.e.: anaphylactic reaction); and/or death.  Furthermore, the patient was informed of those risks and complications associated with the medications. These include, but are not limited to: allergic reactions (i.e.: anaphylactic or anaphylactoid reaction(s)); adrenal axis suppression; blood sugar elevation that in diabetics may result in ketoacidosis or comma; water retention that in patients with history of congestive heart failure may result in shortness of breath, pulmonary edema, and decompensation with resultant heart failure; weight gain; swelling or edema; medication-induced neural toxicity; particulate matter embolism and blood vessel occlusion with resultant organ, and/or nervous system infarction; and/or aseptic necrosis of one or more joints. Finally, the patient was informed that Medicine is not an exact science; therefore, there is also the possibility of unforeseen or unpredictable risks and/or possible complications that may result in a catastrophic outcome. The patient indicated having understood very clearly. We have given the patient no guarantees and we have made no promises. Enough time was given to the patient to ask questions, all of which were answered to the patient's satisfaction. Ms. Rotolo has indicated that she wanted to continue with the procedure. Attestation: I, the ordering provider, attest that I have discussed with the patient the benefits, risks, side-effects, alternatives, likelihood of achieving goals, and potential problems during recovery for the procedure that I have provided informed consent. Date: 06/09/2016; Time: 9:13 AM  Pre-Procedure Preparation:  Monitoring: As per clinic protocol. Respiration, ETCO2, SpO2, BP, heart rate and rhythm monitor placed and  checked for adequate function Safety Precautions: Patient was assessed for positional comfort and pressure points before starting the procedure. Time-out: I initiated and conducted the "Time-out" before starting the procedure, as per protocol. The patient was asked to participate by confirming the accuracy of the "Time Out" information. Verification of the correct person, site, and procedure were performed and confirmed by me, the nursing staff, and the patient. "Time-out" conducted as per Joint Commission's Universal Protocol (UP.01.01.01). "Time-out" Date & Time: 06/09/2016; 0936 hrs.  Description of Procedure Process:   Position: Prone Target Area: For Lumbar Facet blocks, the target is the groove formed by the junction of the transverse process and superior articular process. For the L5 dorsal ramus, the target is the notch between superior articular process and sacral ala. For the S1 dorsal ramus, the target is the superior and lateral edge of the posterior S1 Sacral foramen. Approach: Paramedial approach. Area Prepped: Entire Posterior Lumbosacral Region Prepping solution: ChloraPrep (2% chlorhexidine gluconate and 70% isopropyl alcohol) Safety Precautions: Aspiration looking for blood return was conducted prior to all injections. At no point did we inject any substances, as a needle was being advanced. No attempts were made at seeking any paresthesias. Safe injection practices and needle disposal techniques used. Medications properly checked for expiration dates. SDV (single dose vial) medications used. Description of the Procedure: Protocol guidelines were followed. The patient was placed in position over the fluoroscopy table. The target area was identified and the area prepped in the usual manner. Skin desensitized using vapocoolant spray. Skin & deeper tissues infiltrated with local anesthetic. Appropriate amount of time allowed to pass for local anesthetics to take effect. The procedure needle  was introduced through the skin, ipsilateral to the reported pain, and advanced to the target area. Employing the "Medial Branch Technique", the needles were advanced to the angle made by the superior and medial portion of the transverse process, and the lateral and inferior portion of the superior articulating process of the targeted vertebral bodies. This area is known as "Burton's Eye" or the "Eye of the [place] Dog".  A procedure needle was introduced through the skin, and this time advanced to the angle made by the superior and medial border of the sacral ala, and the lateral border of the S1 vertebral body. This last needle was later repositioned at the superior and lateral border of the posterior S1 foramen. Negative aspiration confirmed. Solution injected in intermittent fashion, asking for systemic symptoms every 0.5cc of injectate. The needles were then removed and the area cleansed, making sure to leave some of the prepping solution back to take advantage of its long term bactericidal properties. Vitals:   06/09/16 0949 06/09/16 1000 06/09/16 1010 06/09/16 1020  BP: 97/64 (!) 94/53 (!) 96/54 (!) 93/54  Pulse:      Resp: '19 17 15 13  '$ Temp:      TempSrc:      SpO2: 91% 91% 93% 96%  Weight:      Height:        Start Time: 0936 hrs. End Time: 0949 hrs.  Illustration of the posterior view of the lumbar spine and the posterior neural structures. Laminae of L2 through S1 are labeled. DPRL5, dorsal primary ramus of L5; DPRS1, dorsal primary ramus of S1; DPR3, dorsal primary ramus of L3; FJ, facet (zygapophyseal) joint L3-L4; I, inferior articular process of L4; LB1, lateral branch of dorsal primary ramus of L1; IAB, inferior articular branches from L3 medial branch (supplies L4-L5 facet joint); IBP, intermediate branch plexus; MB3, medial branch of dorsal primary ramus of L3; NR3, third lumbar nerve root; S, superior articular process of L5; SAB, superior articular branches from L4 (supplies L4-5  facet joint also); TP3, transverse process of L3.  Materials:  Needle(s) Type: Regular needle Gauge: 22G Length: 3.5-in Medication(s): We administered fentaNYL, lactated ringers, midazolam, triamcinolone acetonide, ropivacaine (PF) 5 mg/mL (0.5%), ropivacaine (PF) 5 mg/mL (0.5%), and triamcinolone acetonide. Please see chart orders for dosing details.  Imaging Guidance (Spinal):  Type of Imaging Technique: Fluoroscopy Guidance (Spinal) Indication(s): Assistance in needle guidance and placement for procedures requiring needle placement in or near specific anatomical locations not easily accessible without such assistance. Exposure Time: Please see nurses notes. Contrast: None used. Fluoroscopic Guidance: I was personally present during the use of fluoroscopy. "Tunnel Vision Technique" used to obtain the best possible view of the target area. Parallax error corrected before commencing the procedure. "Direction-depth-direction" technique used to introduce the needle under continuous pulsed fluoroscopy. Once target was reached, antero-posterior, oblique, and lateral fluoroscopic projection used confirm needle placement in all planes. Images permanently stored in EMR. Interpretation: No contrast injected. I personally interpreted the imaging intraoperatively. Adequate needle placement confirmed in multiple planes. Permanent images saved into the patient's record.  Antibiotic Prophylaxis:  Indication(s): None identified Antibiotic given: None  Post-operative Assessment:  EBL: None Complications: No immediate post-treatment complications observed by team, or reported by patient. Note: The patient tolerated the entire procedure well. A repeat set of vitals were taken after the procedure and the patient was kept under observation following institutional policy, for this type of procedure. Post-procedural neurological assessment was performed, showing return to baseline, prior to discharge. The patient  was provided with post-procedure discharge instructions, including a section on how to identify potential problems. Should any problems arise concerning this procedure, the patient was given instructions to immediately contact us, at any time, without hesitation. In any case, we plan to contact the patient by telephone for a follow-up status report regarding this interventional procedure. Comments:  No additional relevant information.  Plan of Care  Disposition:  Discharge home  Discharge Date & Time: 06/09/2016; 1027 hrs.  Physician-requested Follow-up:  Return in about 2 weeks (around 06/23/2016) for Post-Procedure evaluation.  Future Appointments Date Time Provider [place]  07/07/2016 1:15 PM [name], MD [facility] None  07/29/2016 9:00 AM [facility] None  07/29/2016 9:15 AM Sindy Guadeloupe, MD [facility] None  08/19/2016 10:00 AM [name], MD [facility] None  09/08/2016 10:45 AM Hollice Espy, MD [facility] None   Medications ordered for procedure: Meds ordered this encounter  Medications  . fentaNYL (SUBLIMAZE) injection 25-50 mcg    Make sure Narcan is available in the pyxis when using this medication. In the event of respiratory depression (RR< 8/min): Titrate NARCAN (naloxone) in increments of 0.1 to 0.2 mg IV at 2-3 minute intervals, until desired degree of reversal.  . lactated ringers infusion 1,000 mL  . midazolam (VERSED) 5 MG/5ML injection 1-2 mg    Make sure Flumazenil is available in the pyxis when using this medication. If oversedation occurs, administer 0.2 mg IV over 15 sec. If after 45 sec no response, administer 0.2 mg again over 1 min; may repeat at 1 min intervals; not to exceed 4 doses (1 mg)  . triamcinolone acetonide (KENALOG-40) injection 40 mg  . lidocaine (PF) (XYLOCAINE) 1 % injection 10 mL  . ropivacaine (PF) 5 mg/mL (0.5%) (NAROPIN) injection 5 mL    Preservative-free (MPF), single use vial.  . ropivacaine (PF) 5 mg/mL (0.5%)  (NAROPIN) injection 5 mL    Preservative-free (MPF), single use vial.  . triamcinolone acetonide (KENALOG-40) injection 40 mg   Medications administered: We administered fentaNYL, lactated ringers, midazolam, triamcinolone acetonide, ropivacaine (PF) 5 mg/mL (0.5%), ropivacaine (PF) 5 mg/mL (0.5%), and triamcinolone acetonide.  See the medical record for exact dosing, route, and time of administration.  Lab-work, Procedure(s), & Referral(s) Ordered: Orders Placed This Encounter  Procedures  . DG C-Arm 1-60 Min-No Report  . Discharge instructions  . Follow-up  . Informed Consent Details: Transcribe to consent form and obtain patient signature  . Provider attestation of informed consent for procedure/surgical case  . Verify informed consent   Imaging Ordered: No results found for this or any previous visit. New Prescriptions   No medications on file   Primary Care Physician: Jodi Marble, MD Location: [facility] Note by: Kathlen Brunswick. Dossie Arbour, M.D, DABA, DABAPM, DABPM, DABIPP, FIPP Date: 06/09/2016; Time: 10:29 AM  Disclaimer:  Medicine is not an exact science. The only guarantee in medicine is that nothing is guaranteed. It is important to note that the decision to proceed with this intervention was based on the information collected from the patient. The Data and conclusions were drawn from the patient's questionnaire, the interview, and the physical examination. Because the information was provided in large part by the patient, it cannot be guaranteed that it has not been purposely or unconsciously manipulated. Every effort has been made to obtain as much relevant data as possible for this evaluation. It is important to note that the conclusions that lead to this procedure are derived in large part from the available data. Always take into account that the treatment will also be dependent on availability of resources and existing treatment guidelines,  considered by other Pain Management Practitioners as being common knowledge and practice, at the time of the intervention. For Medico-Legal purposes, it is also important to point out that variation in procedural techniques and pharmacological choices are the acceptable norm. The indications, contraindications, technique, and results  of the above procedure should only be interpreted and judged by a Board-Certified Interventional Pain Specialist with extensive familiarity and expertise in the same exact procedure and technique. Attempts at providing opinions without similar or greater experience and expertise than that of the treating physician will be considered as inappropriate and unethical, and shall result in a formal complaint to the state medical board and applicable specialty societies.  Instructions provided at this appointment: Patient Instructions  Post-Procedure instructions Instructions:  Apply ice: Fill a plastic sandwich bag with crushed ice. Cover it with a small towel and apply to injection site. Apply for 15 minutes then remove x 15 minutes. Repeat sequence on day of procedure, until you go to bed. The purpose is to minimize swelling and discomfort after procedure.  Apply heat: Apply heat to procedure site starting the day following the procedure. The purpose is to treat any soreness and discomfort from the procedure.  Food intake: Start with clear liquids (like water) and advance to regular food, as tolerated.   Physical activities: Keep activities to a minimum for the first 8 hours after the procedure.   Driving: If you have received any sedation, you are not allowed to drive for 24 hours after your procedure.  Blood thinner: Restart your blood thinner 6 hours after your procedure. (Only for those taking blood thinners)  Insulin: As soon as you can eat, you may resume your normal dosing schedule. (Only for those taking insulin)  Infection prevention: Keep procedure site clean  and dry.  Post-procedure Pain Diary: Extremely important that this be done correctly and accurately. Recorded information will be used to determine the next step in treatment.  Pain evaluated is that of treated area only. Do not include pain from an untreated area.  Complete every hour, on the hour, for the initial 8 hours. Set an alarm to help you do this part accurately.  Do not go to sleep and have it completed later. It will not be accurate.  Follow-up appointment: Keep your follow-up appointment after the procedure. Usually 2 weeks for most procedures. (6 weeks in the case of radiofrequency.) Bring you pain diary.  Expect:  From numbing medicine (AKA: Local Anesthetics): Numbness or decrease in pain.  Onset: Full effect within 15 minutes of injected.  Duration: It will depend on the type of local anesthetic used. On the average, 1 to 8 hours.   From steroids: Decrease in swelling or inflammation. Once inflammation is improved, relief of the pain will follow.  Onset of benefits: Depends on the amount of swelling present. The more swelling, the longer it will take for the benefits to be seen.   Duration: Steroids will stay in the system x 2 weeks. Duration of benefits will depend on multiple posibilities including persistent irritating factors.  From procedure: Some discomfort is to be expected once the numbing medicine wears off. This should be minimal if ice and heat are applied as instructed. Call if:  You experience numbness and weakness that gets worse with time, as opposed to wearing off.  New onset bowel or bladder incontinence. (Spinal procedures only)  Emergency Numbers:  Durning business hours (Monday - Thursday, 8:00 AM - 4:00 PM) (Friday, 9:00 AM - 12:00 Noon): [phone]  After hours: [phone]   __________________________________________________________________________________________   Pain Management Discharge Instructions  General Discharge  Instructions :  If you need to reach your doctor call: Monday-Friday 8:00 am - 4:00 pm at [phone] or toll free [phone].  After clinic hours  [phone] to have operator reach doctor.  Bring all of your medication bottles to all your appointments in the pain clinic.  To cancel or reschedule your appointment with Pain Management please remember to call 24 hours in advance to avoid a fee.  Refer to the educational materials which you have been given on: General Risks, I had my Procedure. Discharge Instructions, Post Sedation.  Post Procedure Instructions:  The drugs you were given will stay in your system until tomorrow, so for the next 24 hours you should not drive, make any legal decisions or drink any alcoholic beverages.  You may eat anything you prefer, but it is better to start with liquids then soups and crackers, and gradually work up to solid foods.  Please notify your doctor immediately if you have any unusual bleeding, trouble breathing or pain that is not related to your normal pain.  Depending on the type of procedure that was done, some parts of your body may feel week and/or numb.  This usually clears up by tonight or the next day.  Walk with the use of an assistive device or accompanied by an adult for the 24 hours.  You may use ice on the affected area for the first 24 hours.  Put ice in a Ziploc bag and cover with a towel and place against area 15 minutes on 15 minutes off.  You may switch to heat after 24 hours.

## 2016-06-10 ENCOUNTER — Telehealth: Payer: Self-pay | Admitting: *Deleted

## 2016-06-10 NOTE — Telephone Encounter (Signed)
Denies problems post procedure. 

## 2016-06-16 ENCOUNTER — Telehealth: Payer: Self-pay | Admitting: Pain Medicine

## 2016-06-16 NOTE — Telephone Encounter (Signed)
Having increased pain down into legs mostly left sided, like cramps maybe. It flares up then goes away but is intense when it does flare. No pain once it goes away. Please call patient asap

## 2016-06-16 NOTE — Telephone Encounter (Signed)
This began the day after her procedure 1 week ago. Denies loss of control of bowel or bladder, or legs.Patient advised to wait until 10 days after procedure, it is common to have increased pain following a procedure. Instructed to report this on her Post Procedure Diary and bring it to her follow-up appoinemtne.

## 2016-06-17 ENCOUNTER — Telehealth: Payer: Self-pay | Admitting: Pain Medicine

## 2016-06-17 NOTE — Telephone Encounter (Signed)
Called patient re; the c/o pain after procedure.  After questioning patient about pain turns out that she has not been taking her pain medicine since the day of procedure.  Patient states that she thought she was not supposed to take her pain medicine x 2 weeks post procedure. There was also a mention of being on a drug holiday for the 2 weeks.  Explained to [place] that I can not find any notes that states that she is on drug holiday and that we would not order that post procedure.  Rx dates reflect that she should have plenty of medicine currently and instructed patient that she should take her medicine if she needs it.  Also, apologized to patient that if information was inappropriately conveyed to her I am sorry.  Patient verbalizes understanding and will begin medicine right away for her pain.

## 2016-06-17 NOTE — Telephone Encounter (Signed)
Feet into legs into lower back and into neck and lower head, extreme pain, wants to speak with someone

## 2016-06-24 ENCOUNTER — Inpatient Hospital Stay: Payer: Medicare Other | Attending: Oncology

## 2016-06-24 ENCOUNTER — Other Ambulatory Visit: Payer: Self-pay | Admitting: Oncology

## 2016-06-24 ENCOUNTER — Inpatient Hospital Stay: Payer: Medicare Other

## 2016-06-24 ENCOUNTER — Other Ambulatory Visit: Payer: Self-pay | Admitting: *Deleted

## 2016-06-24 ENCOUNTER — Ambulatory Visit: Payer: 59

## 2016-06-24 DIAGNOSIS — F17210 Nicotine dependence, cigarettes, uncomplicated: Secondary | ICD-10-CM | POA: Insufficient documentation

## 2016-06-24 DIAGNOSIS — D500 Iron deficiency anemia secondary to blood loss (chronic): Secondary | ICD-10-CM | POA: Diagnosis present

## 2016-06-24 DIAGNOSIS — K219 Gastro-esophageal reflux disease without esophagitis: Secondary | ICD-10-CM | POA: Diagnosis not present

## 2016-06-24 DIAGNOSIS — K259 Gastric ulcer, unspecified as acute or chronic, without hemorrhage or perforation: Secondary | ICD-10-CM | POA: Insufficient documentation

## 2016-06-24 DIAGNOSIS — E119 Type 2 diabetes mellitus without complications: Secondary | ICD-10-CM | POA: Diagnosis not present

## 2016-06-24 DIAGNOSIS — I10 Essential (primary) hypertension: Secondary | ICD-10-CM | POA: Insufficient documentation

## 2016-06-24 DIAGNOSIS — M5116 Intervertebral disc disorders with radiculopathy, lumbar region: Secondary | ICD-10-CM | POA: Insufficient documentation

## 2016-06-24 DIAGNOSIS — Z79899 Other long term (current) drug therapy: Secondary | ICD-10-CM | POA: Diagnosis not present

## 2016-06-24 DIAGNOSIS — K5730 Diverticulosis of large intestine without perforation or abscess without bleeding: Secondary | ICD-10-CM | POA: Insufficient documentation

## 2016-06-24 DIAGNOSIS — K7460 Unspecified cirrhosis of liver: Secondary | ICD-10-CM | POA: Diagnosis not present

## 2016-06-24 DIAGNOSIS — Z7984 Long term (current) use of oral hypoglycemic drugs: Secondary | ICD-10-CM | POA: Insufficient documentation

## 2016-06-24 DIAGNOSIS — F419 Anxiety disorder, unspecified: Secondary | ICD-10-CM | POA: Insufficient documentation

## 2016-06-24 DIAGNOSIS — M797 Fibromyalgia: Secondary | ICD-10-CM | POA: Insufficient documentation

## 2016-06-24 DIAGNOSIS — G2581 Restless legs syndrome: Secondary | ICD-10-CM | POA: Diagnosis not present

## 2016-06-24 DIAGNOSIS — E559 Vitamin D deficiency, unspecified: Secondary | ICD-10-CM | POA: Diagnosis not present

## 2016-06-24 LAB — PREPARE RBC (CROSSMATCH)

## 2016-06-24 LAB — CBC
HCT: 23.4 % — ABNORMAL LOW (ref 35.0–47.0)
Hemoglobin: 7.9 g/dL — ABNORMAL LOW (ref 12.0–16.0)
MCH: 29.6 pg (ref 26.0–34.0)
MCHC: 33.6 g/dL (ref 32.0–36.0)
MCV: 88.2 fL (ref 80.0–100.0)
Platelets: 220 10*3/uL (ref 150–440)
RBC: 2.65 MIL/uL — ABNORMAL LOW (ref 3.80–5.20)
RDW: 17.5 % — ABNORMAL HIGH (ref 11.5–14.5)
WBC: 10.4 10*3/uL (ref 3.6–11.0)

## 2016-06-24 LAB — FERRITIN: Ferritin: 6 ng/mL — ABNORMAL LOW (ref 11–307)

## 2016-06-24 MED ORDER — ACETAMINOPHEN 325 MG PO TABS: 650.0000 mg | ORAL_TABLET | Freq: Once | ORAL | Status: DC

## 2016-06-24 MED ORDER — SODIUM CHLORIDE 0.9 % IV SOLN
250.0000 mL | Freq: Once | INTRAVENOUS | Status: AC
  Administered 2016-06-24: 250 mL via INTRAVENOUS
  Filled 2016-06-24: qty 250

## 2016-06-25 ENCOUNTER — Telehealth: Payer: Self-pay | Admitting: *Deleted

## 2016-06-25 LAB — TYPE AND SCREEN
ABO/RH(D): O POS
Antibody Screen: NEGATIVE
Unit division: 0

## 2016-06-25 LAB — BPAM RBC
Blood Product Expiration Date: 201804092359
ISSUE DATE / TIME: 201803291136
Unit Type and Rh: 5100

## 2016-06-25 NOTE — Telephone Encounter (Signed)
Per Dr Janese Banks request please contact [facility] - Dr Donnella Sham to see what their plan is for the patient.   I called yesterday and spoke with his nurse, she was supposed to call me back but I never received a call from her, and office was closed for holiday today.

## 2016-06-28 ENCOUNTER — Inpatient Hospital Stay: Payer: Medicare Other | Attending: Oncology

## 2016-06-28 ENCOUNTER — Telehealth: Payer: Self-pay | Admitting: *Deleted

## 2016-06-28 DIAGNOSIS — M5116 Intervertebral disc disorders with radiculopathy, lumbar region: Secondary | ICD-10-CM | POA: Insufficient documentation

## 2016-06-28 DIAGNOSIS — Z79899 Other long term (current) drug therapy: Secondary | ICD-10-CM | POA: Insufficient documentation

## 2016-06-28 DIAGNOSIS — F17210 Nicotine dependence, cigarettes, uncomplicated: Secondary | ICD-10-CM | POA: Insufficient documentation

## 2016-06-28 DIAGNOSIS — Z7984 Long term (current) use of oral hypoglycemic drugs: Secondary | ICD-10-CM | POA: Insufficient documentation

## 2016-06-28 DIAGNOSIS — K259 Gastric ulcer, unspecified as acute or chronic, without hemorrhage or perforation: Secondary | ICD-10-CM | POA: Insufficient documentation

## 2016-06-28 DIAGNOSIS — D509 Iron deficiency anemia, unspecified: Secondary | ICD-10-CM | POA: Insufficient documentation

## 2016-06-28 DIAGNOSIS — K219 Gastro-esophageal reflux disease without esophagitis: Secondary | ICD-10-CM | POA: Insufficient documentation

## 2016-06-28 DIAGNOSIS — G2581 Restless legs syndrome: Secondary | ICD-10-CM | POA: Insufficient documentation

## 2016-06-28 DIAGNOSIS — K7460 Unspecified cirrhosis of liver: Secondary | ICD-10-CM | POA: Insufficient documentation

## 2016-06-28 DIAGNOSIS — M797 Fibromyalgia: Secondary | ICD-10-CM | POA: Insufficient documentation

## 2016-06-28 DIAGNOSIS — E119 Type 2 diabetes mellitus without complications: Secondary | ICD-10-CM | POA: Insufficient documentation

## 2016-06-28 DIAGNOSIS — E559 Vitamin D deficiency, unspecified: Secondary | ICD-10-CM | POA: Insufficient documentation

## 2016-06-28 DIAGNOSIS — K5730 Diverticulosis of large intestine without perforation or abscess without bleeding: Secondary | ICD-10-CM | POA: Insufficient documentation

## 2016-06-28 DIAGNOSIS — I10 Essential (primary) hypertension: Secondary | ICD-10-CM | POA: Insufficient documentation

## 2016-06-28 DIAGNOSIS — F419 Anxiety disorder, unspecified: Secondary | ICD-10-CM | POA: Insufficient documentation

## 2016-06-28 NOTE — Telephone Encounter (Signed)
Called [facility] and spoke to Butch Penny to inquire as to Dr. Marton Redwood plan is for patient.  Per Butch Penny, Dr. Gustavo Lah as well as the nurses are out of the office today.  She will have Tabitha, RN call me back tomorrow with the plan for patient.

## 2016-07-01 ENCOUNTER — Other Ambulatory Visit: Payer: Self-pay | Admitting: *Deleted

## 2016-07-02 ENCOUNTER — Inpatient Hospital Stay: Payer: Medicare Other

## 2016-07-02 ENCOUNTER — Inpatient Hospital Stay: Payer: Medicare Other | Admitting: Oncology

## 2016-07-06 ENCOUNTER — Telehealth: Payer: Self-pay | Admitting: *Deleted

## 2016-07-06 NOTE — Telephone Encounter (Signed)
Lawerance Bach, RN for Dr. Gustavo Lah called back with plan for patient.  He will follow up with patient regarding Hep C and Cirrhosis.  States there is little else that he can do for her.

## 2016-07-07 ENCOUNTER — Encounter: Payer: Self-pay | Admitting: Pain Medicine

## 2016-07-07 ENCOUNTER — Ambulatory Visit: Payer: Medicare Other | Attending: Pain Medicine | Admitting: Pain Medicine

## 2016-07-07 VITALS — BP 105/72 | HR 75 | Temp 98.0°F | Resp 16 | Ht 70.0 in | Wt 147.0 lb

## 2016-07-07 DIAGNOSIS — E1165 Type 2 diabetes mellitus with hyperglycemia: Secondary | ICD-10-CM | POA: Diagnosis not present

## 2016-07-07 DIAGNOSIS — M5416 Radiculopathy, lumbar region: Secondary | ICD-10-CM | POA: Insufficient documentation

## 2016-07-07 DIAGNOSIS — Z79891 Long term (current) use of opiate analgesic: Secondary | ICD-10-CM | POA: Diagnosis not present

## 2016-07-07 DIAGNOSIS — I959 Hypotension, unspecified: Secondary | ICD-10-CM | POA: Insufficient documentation

## 2016-07-07 DIAGNOSIS — G8929 Other chronic pain: Secondary | ICD-10-CM | POA: Insufficient documentation

## 2016-07-07 DIAGNOSIS — I10 Essential (primary) hypertension: Secondary | ICD-10-CM | POA: Insufficient documentation

## 2016-07-07 DIAGNOSIS — D62 Acute posthemorrhagic anemia: Secondary | ICD-10-CM | POA: Diagnosis not present

## 2016-07-07 DIAGNOSIS — M5412 Radiculopathy, cervical region: Secondary | ICD-10-CM | POA: Insufficient documentation

## 2016-07-07 DIAGNOSIS — K219 Gastro-esophageal reflux disease without esophagitis: Secondary | ICD-10-CM | POA: Insufficient documentation

## 2016-07-07 DIAGNOSIS — M479 Spondylosis, unspecified: Secondary | ICD-10-CM | POA: Insufficient documentation

## 2016-07-07 DIAGNOSIS — M47896 Other spondylosis, lumbar region: Secondary | ICD-10-CM | POA: Insufficient documentation

## 2016-07-07 DIAGNOSIS — G2581 Restless legs syndrome: Secondary | ICD-10-CM | POA: Diagnosis not present

## 2016-07-07 DIAGNOSIS — M961 Postlaminectomy syndrome, not elsewhere classified: Secondary | ICD-10-CM

## 2016-07-07 DIAGNOSIS — K420 Umbilical hernia with obstruction, without gangrene: Secondary | ICD-10-CM | POA: Insufficient documentation

## 2016-07-07 DIAGNOSIS — M25559 Pain in unspecified hip: Secondary | ICD-10-CM

## 2016-07-07 DIAGNOSIS — M545 Low back pain: Secondary | ICD-10-CM | POA: Insufficient documentation

## 2016-07-07 DIAGNOSIS — M25552 Pain in left hip: Secondary | ICD-10-CM | POA: Diagnosis not present

## 2016-07-07 DIAGNOSIS — D509 Iron deficiency anemia, unspecified: Secondary | ICD-10-CM | POA: Insufficient documentation

## 2016-07-07 DIAGNOSIS — B1920 Unspecified viral hepatitis C without hepatic coma: Secondary | ICD-10-CM | POA: Insufficient documentation

## 2016-07-07 DIAGNOSIS — G4700 Insomnia, unspecified: Secondary | ICD-10-CM | POA: Diagnosis not present

## 2016-07-07 DIAGNOSIS — D696 Thrombocytopenia, unspecified: Secondary | ICD-10-CM | POA: Insufficient documentation

## 2016-07-07 DIAGNOSIS — M4696 Unspecified inflammatory spondylopathy, lumbar region: Secondary | ICD-10-CM | POA: Diagnosis not present

## 2016-07-07 DIAGNOSIS — F17210 Nicotine dependence, cigarettes, uncomplicated: Secondary | ICD-10-CM | POA: Insufficient documentation

## 2016-07-07 DIAGNOSIS — M533 Sacrococcygeal disorders, not elsewhere classified: Secondary | ICD-10-CM | POA: Diagnosis not present

## 2016-07-07 DIAGNOSIS — M47816 Spondylosis without myelopathy or radiculopathy, lumbar region: Secondary | ICD-10-CM | POA: Diagnosis not present

## 2016-07-07 DIAGNOSIS — M5442 Lumbago with sciatica, left side: Secondary | ICD-10-CM

## 2016-07-07 NOTE — Progress Notes (Signed)
[name] Name: Audrey Garrett  MRN: [id number]  Referring Provider: Jodi Marble, MD  DOB: 1960/08/16  PCP: Jodi Marble, MD  DOS: 07/07/2016  Note by: Kathlen Brunswick. Audrey Arbour, MD  Service setting: Ambulatory outpatient  Specialty: Interventional Pain Management  Location: [facility] (AMB) Pain Management Facility    Patient type: Established   Primary Reason(s) for Visit: Encounter for post-procedure evaluation of chronic illness with mild to moderate exacerbation CC: Back Pain (lower)  HPI  Audrey Garrett is a 56 y.o. year old, female patient, who comes today for a post-procedure evaluation. She has Insomnia, persistent; BP (high blood pressure); Cervical radicular pain; Uncomplicated opioid dependence ([place]); Long term prescription opiate use; Failed back surgical syndrome; Lumbar facet syndrome (Bilateral) (L>R); Osteoarthritis of spine with radiculopathy, lumbar region; Pain of paraspinal muscle; Chronic low back pain (Location of Primary Source of Pain) (Bilateral) (L>R); HCV (hepatitis C virus); At risk for osteopenia; Lumbar spondylosis; Chronic lumbar radicular pain (Location of Secondary source of pain) (Left); Chronic neck pain; Chronic lower extremity pain (Left); Chronic sacroiliac joint pain; Chronic upper back pain; Long term current use of opiate analgesic; Encounter for chronic pain management; Hypomagnesemia (low magnesium levels); Hypotension; Anemia; Thrombocytopenia ([place]); Coagulopathy ([place]); Hyperglycemia; Polyneuropathy; Urinary retention; Difficulty urinating; Nephrolithiasis; Alcoholic cirrhosis of liver with ascites ([place]); Depression, major, recurrent, moderate ([place]); Abdominal pain, chronic, epigastric; Hepatic cirrhosis ([place]); Acute GI bleeding; Neurogenic pain; S/P thoracolumbar fusion (T5-L5); Grade 1 Retrolisthesis (4 mm) of C5 over C6 and C6 over C7; Cervical foraminal stenosis (Left C5-6; Bilateral C6-7); Hernia of anterior abdominal wall; Incarcerated umbilical hernia;  Controlled type 2 diabetes mellitus with hyperglycemia ([place]); Acute blood loss anemia; Chronic pain syndrome; Malnutrition of moderate degree; Iron deficiency anemia due to chronic blood loss; Opiate use; RLS (restless legs syndrome); Bilateral leg edema; Depression, major, in remission ([place]); and Hip pain, chronic, unspecified laterality on her problem list. Her primarily concern today is the Back Pain (lower)  Pain Assessment: Self-Reported Pain Score: 4 /10 Clinically the patient looks like a 2/10 Reported level is inconsistent with clinical observations. Information on the proper use of the pain scale provided to the patient today Pain Type: Chronic pain Pain Location: Back Pain Orientation: Lower Pain Descriptors / Indicators: Aching, Sharp, Shooting, Throbbing, Jabbing, Cramping, Squeezing (when pain inner legs pain for 15  minuites) Pain Frequency: Constant  Audrey Garrett comes in today for post-procedure evaluation after the treatment done on 06/17/2016.  Further details on both, my assessment(s), as well as the proposed treatment plan, please see below.  Post-Procedure Assessment  06/17/2016 Procedure: Diagnostic bilateral lumbar facet block #1 under fluoroscopic guidance and IV sedation Pre-procedure pain score:  4/10 Post-procedure pain score: 0/10 (100% relief) Influential Factors: BMI: 21.09 kg/m Intra-procedural challenges: None observed Assessment challenges: None detected         Post-procedural side-effects, adverse reactions, or complications: None reported Reported issues: None  Sedation: Sedation provided. When no sedatives are used, the analgesic levels obtained are directly associated to the effectiveness of the local anesthetics. However, when sedation is provided, the level of analgesia obtained during the initial 1 hour following the intervention, is believed to be the result of a combination of factors. These factors may include, but are not limited to: 1. The  effectiveness of the local anesthetics used. 2. The effects of the analgesic(s) and/or anxiolytic(s) used. 3. The degree of discomfort experienced by the patient at the time of the procedure. 4. The patients ability and reliability in recalling and recording  the events. 5. The presence and influence of possible secondary gains and/or psychosocial factors. Reported result: Relief experienced during the 1st hour after the procedure: 100 % (Ultra-Short Term Relief) Interpretative annotation: Analgesia during this period is likely to be Local Anesthetic and/or IV Sedative (Analgesic/Anxiolitic) related.          Effects of local anesthetic: The analgesic effects attained during this period are directly associated to the localized infiltration of local anesthetics and therefore cary significant diagnostic value as to the etiological location, or anatomical origin, of the pain. Expected duration of relief is directly dependent on the pharmacodynamics of the local anesthetic used. Long-acting (4-6 hours) anesthetics used.  Reported result: Relief during the next 4 to 6 hour after the procedure: 100 % (Short-Term Relief) Interpretative annotation: Complete relief would suggest area to be the source of the pain.          Long-term benefit: Defined as the period of time past the expected duration of local anesthetics. With the possible exception of prolonged sympathetic blockade from the local anesthetics, benefits during this period are typically attributed to, or associated with, other factors such as analgesic sensory neuropraxia, antiinflammatory effects, or beneficial biochemical changes provided by agents other than the local anesthetics Reported result: Extended relief following procedure: 75 % (Long-Term Relief) Interpretative annotation: Good relief. This could suggest inflammation to be a significant component in the etiology to the pain.          Current benefits: Defined as persistent relief that  continues at this point in time.   Reported results: Treated area: >50 % Audrey Garrett reports improvement in function Interpretative annotation: Ongoing benefits would suggest effective therapeutic approach  Interpretation: Results would suggest a successful diagnostic intervention.          Laboratory Chemistry  Inflammation Markers Lab Results  Component Value Date   CRP <0.5 04/03/2015   ESRSEDRATE 27 04/03/2015   (CRP: Acute Phase) (ESR: Chronic Phase) Renal Function Markers Lab Results  Component Value Date   BUN 7 04/12/2016   CREATININE 0.69 04/12/2016   GFRAA >60 04/12/2016   GFRNONAA >60 04/12/2016   Hepatic Function Markers Lab Results  Component Value Date   AST 20 04/12/2016   ALT 9 (L) 04/12/2016   ALBUMIN 3.5 04/12/2016   ALKPHOS 59 04/12/2016   Electrolytes Lab Results  Component Value Date   NA 130 (L) 04/12/2016   K 3.4 (L) 04/12/2016   CL 97 (L) 04/12/2016   CALCIUM 9.3 04/12/2016   MG 1.9 01/22/2016   Neuropathy Markers Lab Results  Component Value Date   VITAMINB12 591 02/13/2016   Bone Pathology Markers Lab Results  Component Value Date   ALKPHOS 59 04/12/2016   CALCIUM 9.3 04/12/2016   Coagulation Parameters Lab Results  Component Value Date   INR 1.21 04/01/2016   LABPROT 15.4 (H) 04/01/2016   APTT 34 01/20/2016   PLT 224 07/08/2016   Cardiovascular Markers Lab Results  Component Value Date   BNP 929 (H) 09/23/2013   HGB 10.5 (L) 07/08/2016   HCT 31.3 (L) 07/08/2016   Note: Lab results reviewed.  Recent Diagnostic Imaging Review  Dg C-arm 1-60 Min-no Report  Result Date: 06/09/2016 Fluoroscopy was utilized by the requesting physician.  No radiographic interpretation.   Note: Imaging results reviewed.          Meds  The patient has a current medication list which includes the following prescription(s): calcium carbonate, ciprofloxacin, furosemide, lactulose, magnesium oxide, multivitamin with minerals,  omeprazole,  oxycodone hcl, oxycodone hcl, paroxetine, phytonadione, pregabalin, pregabalin, spironolactone, sucralfate, thiamine, trazodone, and oxycodone hcl.  Current Outpatient Prescriptions on File Prior to Visit  Medication Sig  . calcium carbonate (TUMS - DOSED IN MG ELEMENTAL CALCIUM) 500 MG chewable tablet Chew 2 tablets by mouth 2 (two) times daily.  . ciprofloxacin (CIPRO) 500 MG tablet Take 500 mg by mouth 2 (two) times a week. Monday and friday  . furosemide (LASIX) 40 MG tablet Take by mouth 2 (two) times daily.   Marland Kitchen lactulose (CHRONULAC) 10 GM/15ML solution Take 10 g by mouth 3 (three) times daily.   . Magnesium Oxide 250 MG TABS Take 1 tablet by mouth daily.  . Multiple Vitamin (MULTIVITAMIN WITH MINERALS) TABS tablet Take 1 tablet by mouth daily.  Marland Kitchen omeprazole (PRILOSEC) 20 MG capsule Take 20 mg by mouth daily.  . Oxycodone HCl 20 MG TABS Take 1 tablet (20 mg total) by mouth every 6 (six) hours as needed.  Derrill Memo ON 07/26/2016] Oxycodone HCl 20 MG TABS Take 1 tablet (20 mg total) by mouth every 6 (six) hours as needed.  Marland Kitchen PARoxetine (PAXIL) 40 MG tablet Take 40 mg by mouth every morning.  . phytonadione (VITAMIN K) 5 MG tablet Take 5 mg by mouth 3 (three) times a week. Mon, Wed, Fri  . pregabalin (LYRICA) 50 MG capsule Take by mouth 2 (two) times daily.   . pregabalin (LYRICA) 75 MG capsule Take 75 mg by mouth 2 (two) times daily.  Marland Kitchen spironolactone (ALDACTONE) 50 MG tablet Take 100 mg by mouth daily.   . sucralfate (CARAFATE) 1 GM/10ML suspension Take 1 g by mouth 3 (three) times daily as needed.   . thiamine (VITAMIN B-1) 100 MG tablet Take 1 tablet (100 mg total) by mouth daily.  . traZODone (DESYREL) 50 MG tablet Take 50 mg by mouth at bedtime.  . Oxycodone HCl 20 MG TABS Take 1 tablet (20 mg total) by mouth every 6 (six) hours.   No current facility-administered medications on file prior to visit.    ROS  Constitutional: Denies any fever or chills Gastrointestinal: No reported  hemesis, hematochezia, vomiting, or acute GI distress Musculoskeletal: Denies any acute onset joint swelling, redness, loss of ROM, or weakness Neurological: No reported episodes of acute onset apraxia, aphasia, dysarthria, agnosia, amnesia, paralysis, loss of coordination, or loss of consciousness  Allergies  Ms. Ficken has No Known Allergies.  PFSH  Drug: Ms. Schwalm  reports that she does not use drugs. Alcohol:  reports that she does not drink alcohol. Tobacco:  reports that she has been smoking Cigarettes.  She has a 20.00 pack-year smoking history. She has never used smokeless tobacco. Medical:  has a past medical history of Alcohol abuse; Alcoholic cirrhosis of liver without ascites ([place]); Anemia; Anxiety; Arthropathy; Back pain; Bronchitis; BRONCHITIS, ACUTE (11/01/2009); Chronic hepatitis C ([place]); Chronic LBP (12/25/2014); Cirrhosis ([place]); DDD (degenerative disc disease), lumbar (01/01/2015); Depression; Diabetes mellitus without complication ([place]); Diverticulosis; Edema; Fatigue; Fibromyalgia; GERD (gastroesophageal reflux disease); High risk medications (not anticoagulants) long-term use; Hyperglycemia; Hypertension; Kidney mass; Left leg pain (01/01/2015); Lumbago; Lumbar radicular pain (01/01/2015); Muscle spasm; Neck pain (01/01/2015); Polyarthritis; Reflux; Restless leg syndrome; RLS (restless legs syndrome); Sacroiliac pain (01/01/2015); Shoulder pain; Sinusitis; SOB (shortness of breath); Spine disorder; Stress headaches; Upper back pain (01/01/2015); Urinary frequency; and Vitamin D deficiency disease. Family: family history includes [name] in her father; Breast cancer (age of onset: 106) in her sister; Heart disease in her mother; Stroke in her  mother.  Past Surgical History:  Procedure Laterality Date  . BACK SURGERY  12/19/2011  . BACK SURGERY    . COLONOSCOPY WITH PROPOFOL N/A 09/25/2015   Procedure: COLONOSCOPY WITH PROPOFOL;  Surgeon: Lollie Sails, MD;  Location: [facility] ENDOSCOPY;   Service: Endoscopy;  Laterality: N/A;  . COLONOSCOPY WITH PROPOFOL N/A 04/01/2016   Procedure: COLONOSCOPY WITH PROPOFOL;  Surgeon: Lollie Sails, MD;  Location[facility]COPY;  Service: Endoscopy;  Laterality: N/A;  . ESOPHAGOGASTRODUODENOSCOPY N/A 04/14/2015   Procedure: ESOPHAGOGASTRODUODENOSCOPY (EGD);  Surgeon: Lollie Sails, MD;  Location: [facility] ENDOSCOPY;  Service: Endoscopy;  Laterality: N/A;  . ESOPHAGOGASTRODUODENOSCOPY (EGD) WITH PROPOFOL N/A 09/16/2015   Procedure: ESOPHAGOGASTRODUODENOSCOPY (EGD) WITH PROPOFOL;  Surgeon: Lollie Sails, MD;  Location: [facility] ENDOSCOPY;  Service: Endoscopy;  Laterality: N/A;  . ESOPHAGOGASTRODUODENOSCOPY (EGD) WITH PROPOFOL N/A 01/22/2016   Procedure: ESOPHAGOGASTRODUODENOSCOPY (EGD) WITH PROPOFOL;  Surgeon: [name], MD;  Location: [place];  Service: Endoscopy;  Laterality: N/A;  . ESOPHAGOGASTRODUODENOSCOPY (EGD) WITH PROPOFOL N/A 04/01/2016   Procedure: ESOPHAGOGASTRODUODENOSCOPY (EGD) WITH PROPOFOL;  Surgeon: Lollie Sails, MD;  Location: [facility] ENDOSCOPY;  Service: Endoscopy;  Laterality: N/A;  . HERNIA REPAIR N/A 10/2015   abdominal   . TONSILLECTOMY    . TUBAL LIGATION    . UMBILICAL HERNIA REPAIR N/A 11/06/2015   Procedure: HERNIA REPAIR UMBILICAL ADULT;  Surgeon: [name], MD;  Location: [facility] ORS;  Service: General;  Laterality: N/A;   Constitutional Exam  General appearance: Well nourished, well developed, and well hydrated. In no apparent acute distress Vitals:   07/07/16 1326  BP: 105/72  Pulse: 75  Resp: 16  Temp: 98 F (36.7 C)  SpO2: 98%  Weight: 147 lb (66.7 kg)  Height: _0  (1.778 m)   BMI Assessment: Estimated body mass index is 21.09 kg/m as calculated from the following:   Height as of this encounter: _1  (1.778 m).   Weight as of this encounter: 147 lb (66.7 kg).  BMI interpretation table: BMI level Category Range association with higher incidence of chronic pain  <18 kg/m2  Underweight   18.5-24.9 kg/m2 Ideal body weight   25-29.9 kg/m2 Overweight Increased incidence by 20%  30-34.9 kg/m2 Obese (Class I) Increased incidence by 68%  35-39.9 kg/m2 Severe obesity (Class II) Increased incidence by 136%  >40 kg/m2 Extreme obesity (Class III) Increased incidence by 254%   BMI Readings from Last 4 Encounters:  07/08/16 20.97 kg/m  07/07/16 21.09 kg/m  06/09/16 21.67 kg/m  05/20/16 20.37 kg/m   Wt Readings from Last 4 Encounters:  07/08/16 146 lb 2 oz (66.3 kg)  07/07/16 147 lb (66.7 kg)  06/09/16 151 lb (68.5 kg)  05/20/16 142 lb (64.4 kg)  Psych/Mental status: Alert, oriented x 3 (person, place, & time)       Eyes: PERLA Respiratory: No evidence of acute respiratory distress  Cervical Spine Exam  Inspection: No masses, redness, or swelling Alignment: Symmetrical Functional ROM: Unrestricted ROM Stability: No instability detected Muscle strength & Tone: Functionally intact Sensory: Unimpaired Palpation: No palpable anomalies  Upper Extremity (UE) Exam    Side: Right upper extremity  Side: Left upper extremity  Inspection: No masses, redness, swelling, or asymmetry. No contractures  Inspection: No masses, redness, swelling, or asymmetry. No contractures  Functional ROM: Unrestricted ROM          Functional ROM: Unrestricted ROM          Muscle strength & Tone: Functionally intact  Muscle  strength & Tone: Functionally intact  Sensory: Unimpaired  Sensory: Unimpaired  Palpation: No palpable anomalies  Palpation: No palpable anomalies  Specialized Test(s): Deferred         Specialized Test(s): Deferred          Thoracic Spine Exam  Inspection: No masses, redness, or swelling Alignment: Symmetrical Functional ROM: Unrestricted ROM Stability: No instability detected Sensory: Unimpaired Muscle strength & Tone: No palpable anomalies  Lumbar Spine Exam  Inspection: No masses, redness, or swelling Alignment: Symmetrical Functional ROM: Unrestricted  ROM Stability: No instability detected Muscle strength & Tone: Functionally intact Sensory: Unimpaired Palpation: No palpable anomalies Provocative Tests: Lumbar Hyperextension and rotation test: evaluation deferred today       Patrick's Maneuver: evaluation deferred today              Gait & Posture Assessment  Ambulation: Unassisted Gait: Relatively normal for age and body habitus Posture: WNL   Lower Extremity Exam    Side: Right lower extremity  Side: Left lower extremity  Inspection: No masses, redness, swelling, or asymmetry. No contractures  Inspection: No masses, redness, swelling, or asymmetry. No contractures  Functional ROM: Unrestricted ROM          Functional ROM: Unrestricted ROM          Muscle strength & Tone: Functionally intact  Muscle strength & Tone: Functionally intact  Sensory: Unimpaired  Sensory: Unimpaired  Palpation: No palpable anomalies  Palpation: No palpable anomalies   Assessment  Primary Diagnosis & Pertinent Problem List: The primary encounter diagnosis was Chronic low back pain (Location of Primary Source of Pain) (Bilateral) (L>R). Diagnoses of Lumbar facet syndrome (Bilateral) (L>R), Lumbar spondylosis, Chronic lumbar radicular pain (Location of Secondary source of pain) (Left), Failed back surgical syndrome, Hip pain, chronic, unspecified laterality, and Chronic sacroiliac joint pain were also pertinent to this visit.  Status Diagnosis  Controlled Controlled Controlled 1. Chronic low back pain (Location of Primary Source of Pain) (Bilateral) (L>R)   2. Lumbar facet syndrome (Bilateral) (L>R)   3. Lumbar spondylosis   4. Chronic lumbar radicular pain (Location of Secondary source of pain) (Left)   5. Failed back surgical syndrome   6. Hip pain, chronic, unspecified laterality   7. Chronic sacroiliac joint pain      Plan of Care  Pharmacotherapy (Medications Ordered): No orders of the defined types were placed in this encounter.  New  Prescriptions   No medications on file   Medications administered today: Ms. Ruotolo had no medications administered during this visit. Lab-work, procedure(s), and/or referral(s): Orders Placed This Encounter  Procedures  . LUMBAR FACET(MEDIAL BRANCH NERVE BLOCK) MBNB  . SACROILIAC JOINT INJECTINS  . HIP INJECTION  . HIP INJECTION  . DG HIP UNILAT W OR W/O PELVIS 2-3 VIEWS LEFT  . DG HIP UNILAT W OR W/O PELVIS 2-3 VIEWS RIGHT  . DG Si Joints  . Ambulatory referral to Physical Therapy   Imaging and/or referral(s): AMB REFERRAL TO PHYSICAL THERAPY DG HIP UNILAT W OR W/O PELVIS 2-3 VIEWS LEFT DG HIP UNILAT W OR W/O PELVIS 2-3 VIEWS RIGHT DG SI JOINTS  Interventional therapies: Planned, scheduled, and/or pending:   Diagnostic bilateral intra-articular hip joint injection under fluoroscopic guidance and IV sedation    Considering:   Diagnostic bilateral lumbar facet block  Possible bilateral lumbar facet RFA  Diagnostic left caudal Epidural steroid injection + diagnostic epidurogram  Possible Racz procedure  Diagnostic bilateral sacroiliac joint injection  Possible bilateral sacroiliac joint RFA  Diagnostic left cervical epidural steroid injection  Diagnostic bilateral cervical facet block  Possible bilateral cervical facet RFA    Palliative PRN treatment(s):   Diagnostic bilateral lumbar facet block under fluoroscopic guidance and IV sedation  Diagnostic bilateral intra-articular hip joint injection under fluoroscopic guidance and IV sedation  Diagnostic bilateral sacroiliac joint block under fluoroscopic guidance and IV sedation    Provider-requested follow-up: Return for procedure (ASAP).  Future Appointments Date Time Provider [place]  07/12/2016 9:00 AM [name], MD [facility] None  07/15/2016 11:00 AM [facility]AIR 5 [facility] None  07/29/2016 9:00 AM [facility] None  07/29/2016 9:15 AM Sindy Guadeloupe, MD [facility] None    08/12/2016 1:30 PM [facility] None  08/12/2016 1:45 PM Sindy Guadeloupe, MD [facility] None  08/19/2016 10:00 AM [name], MD [facility] None  09/08/2016 10:45 AM Hollice Espy, MD [facility] None   Primary Care Physician: Jodi Marble, MD Location: [facility] Facility Note by: Kathlen Brunswick. Audrey Garrett, M.D, DABA, DABAPM, DABPM, DABIPP, FIPP Date: 07/07/2016; Time: 6:54 PM  Pain Score Disclaimer: We use the NRS-11 scale. This is a self-reported, subjective measurement of pain severity with only modest accuracy. It is used primarily to identify changes within a particular patient. It must be understood that outpatient pain scales are significantly less accurate that those used for research, where they can be applied under ideal controlled circumstances with minimal exposure to variables. In reality, the score is likely to be a combination of pain intensity and pain affect, where pain affect describes the degree of emotional arousal or changes in action readiness caused by the sensory experience of pain. Factors such as social and work situation, setting, emotional state, anxiety levels, expectation, and prior pain experience may influence pain perception and show large inter-individual differences that may also be affected by time variables.  Patient instructions provided during this appointment: Patient Instructions   Sacroiliac (SI) Joint Injection Patient Information  Description: The sacroiliac joint connects the scrum (very low back and tailbone) to the ilium (a pelvic bone which also forms half of the hip joint).  Normally this joint experiences very little motion.  When this joint becomes inflamed or unstable low back and or hip and pelvis pain may result.  Injection of this joint with local anesthetics (numbing medicines) and steroids can provide diagnostic information and reduce pain.  This injection is performed with the aid of x-ray guidance into the  tailbone area while you are lying on your stomach.   You may experience an electrical sensation down the leg while this is being done.  You may also experience numbness.  We also may ask if we are reproducing your normal pain during the injection.  Conditions which may be treated SI injection:   Low back, buttock, hip or leg pain  Preparation for the Injection:  1. Do not eat any solid food or dairy products within 8 hours of your appointment.  2. You may drink clear liquids up to 3 hours before appointment.  Clear liquids include water, black coffee, juice or soda.  No milk or cream please. 3. You may take your regular medications, including pain medications with a sip of water before your appointment.  Diabetics should hold regular insulin (if take separately) and take 1/2 normal NPH dose the morning of the procedure.  Carry some sugar containing items with you to your appointment. 4. A driver must accompany you and be prepared to drive you home  after your procedure. 5. Bring all of your current medications with you. 6. An IV may be inserted and sedation may be given at the discretion of the physician. 7. A blood pressure cuff, EKG and other monitors will often be applied during the procedure.  Some patients may need to have extra oxygen administered for a short period.  8. You will be asked to provide medical information, including your allergies, prior to the procedure.  We must know immediately if you are taking blood thinners (like Coumadin/Warfarin) or if you are allergic to IV iodine contrast (dye).  We must know if you could possible be pregnant.  Possible side effects:   Bleeding from needle site  Infection (rare, may require surgery)  Nerve injury (rare)  Numbness & tingling (temporary)  A brief convulsion or seizure  Light-headedness (temporary)  Pain at injection site (several days)  Decreased blood pressure (temporary)  Weakness in the leg (temporary)   Call if  you experience:   New onset weakness or numbness of an extremity below the injection site that last more than 8 hours.  Hives or difficulty breathing ( go to the emergency room)  Inflammation or drainage at the injection site  Any new symptoms which are concerning to you  Please note:  Although the local anesthetic injected can often make your back/ hip/ buttock/ leg feel good for several hours after the injections, the pain will likely return.  It takes 3-7 days for steroids to work in the sacroiliac area.  You may not notice any pain relief for at least that one week.  If effective, we will often do a series of three injections spaced 3-6 weeks apart to maximally decrease your pain.  After the initial series, we generally will wait some months before a repeat injection of the same type.  If you have any questions, please call [phone] [facility]  Facet Joint Block The facet joints connect the bones of the spine (vertebrae). They make it possible for you to bend, twist, and make other movements with your spine. They also keep you from bending too far, twisting too far, and making other excessive movements. A facet joint block is a procedure where a numbing medicine (anesthetic) is injected into a facet joint. Often, a type of anti-inflammatory medicine called a steroid is also injected. A facet joint block may be done to diagnose neck or back pain. If the pain gets better after a facet joint block, it means the pain is probably coming from the facet joint. If the pain does not get better, it means the pain is probably not coming from the facet joint. A facet joint block may also be done to relieve neck or back pain caused by an inflamed facet joint. A facet joint block is only done to relieve pain if the pain does not improve with other methods, such as medicine, exercise programs, and physical therapy. Tell a health care provider about:  Any  allergies you have.  All medicines you are taking, including vitamins, herbs, eye drops, creams, and over-the-counter medicines.  Any problems you or family members have had with anesthetic medicines.  Any blood disorders you have.  Any surgeries you have had.  Any medical conditions you have.  Whether you are pregnant or may be pregnant. What are the risks? Generally, this is a safe procedure. However, problems may occur, including:  Bleeding.  Injury to a nerve near the injection site.  Pain at  the injection site.  Weakness or numbness in areas controlled by nerves near the injection site.  Infection.  Temporary fluid retention.  Allergic reactions to medicines or dyes.  Injury to other structures or organs near the injection site. What happens before the procedure?  Follow instructions from your health care provider about eating or drinking restrictions.  Ask your health care provider about:  Changing or stopping your regular medicines. This is especially important if you are taking diabetes medicines or blood thinners.  Taking medicines such as aspirin and ibuprofen. These medicines can thin your blood. Do not take these medicines before your procedure if your health care provider instructs you not to.  Do not take any new dietary supplements or medicines without asking your health care provider first.  Plan to have someone take you home after the procedure. What happens during the procedure?  You may need to remove your clothing and dress in an open-back gown.  The procedure will be done while you are lying on an X-ray table. You will most likely be asked to lie on your stomach, but you may be asked to lie in a different position if an injection will be made in your neck.  Machines will be used to monitor your oxygen levels, heart rate, and blood pressure.  If an injection will be made in your neck, an IV tube will be inserted into one of your veins. Fluids  and medicine will flow directly into your body through the IV tube.  The area over the facet joint where the injection will be made will be cleaned with soap. The surrounding skin will be covered with clean drapes.  A numbing medicine (local anesthetic) will be applied to your skin. Your skin may sting or burn for a moment.  A video X-ray machine (fluoroscopy) will be used to locate the joint. In some cases, a CT scan may be used.  A contrast dye may be injected into the facet joint area to help locate the joint.  When the joint is located, an anesthetic will be injected into the joint through the needle.  Your health care provider will ask you whether you feel pain relief. If you do feel relief, a steroid may be injected to provide pain relief for a longer period of time. If you do not feel relief or feel only partial relief, additional injections of an anesthetic may be made in other facet joints.  The needle will be removed.  Your skin will be cleaned.  A bandage (dressing) will be applied over each injection site. The procedure may vary among health care providers and hospitals. What happens after the procedure?  You will be observed for 15-30 minutes before being allowed to go home. This information is not intended to replace advice given to you by your health care provider. Make sure you discuss any questions you have with your health care provider. Document Released: 08/04/2006 Document Revised: 04/16/2015 Document Reviewed: 12/09/2014 Elsevier Interactive Patient Education  2017 [place]  What are the risk, side effects and possible complications? Generally speaking, most procedures are safe.  However, with any procedure there are risks, side effects, and the possibility of complications.  The risks and complications are dependent upon the sites that are lesioned, or the type of nerve block to be performed.  The closer the procedure is to the  spine, the more serious the risks are.  Great care is taken when placing the radio frequency needles,  block needles or lesioning probes, but sometimes complications can occur. 1. Infection: Any time there is an injection through the skin, there is a risk of infection.  This is why sterile conditions are used for these blocks.  There are four possible types of infection. 1. Localized skin infection. 2. Central Nervous System Infection-This can be in the form of Meningitis, which can be deadly. 3. Epidural Infections-This can be in the form of an epidural abscess, which can cause pressure inside of the spine, causing compression of the spinal cord with subsequent paralysis. This would require an emergency surgery to decompress, and there are no guarantees that the patient would recover from the paralysis. 4. Discitis-This is an infection of the intervertebral discs.  It occurs in about 1% of discography procedures.  It is difficult to treat and it may lead to surgery.        2. Pain: the needles have to go through skin and soft tissues, will cause soreness.       3. Damage to internal structures:  The nerves to be lesioned may be near blood vessels or    other nerves which can be potentially damaged.       4. Bleeding: Bleeding is more common if the patient is taking blood thinners such as  aspirin, Coumadin, Ticiid, Plavix, etc., or if he/she have some genetic predisposition  such as hemophilia. Bleeding into the spinal canal can cause compression of the spinal  cord with subsequent paralysis.  This would require an emergency surgery to  decompress and there are no guarantees that the patient would recover from the  paralysis.       5. Pneumothorax:  Puncturing of a lung is a possibility, every time a needle is introduced in  the area of the chest or upper back.  Pneumothorax refers to free air around the  collapsed lung(s), inside of the thoracic cavity (chest cavity).  Another two possible  complications  related to a similar event would include: Hemothorax and Chylothorax.   These are variations of the Pneumothorax, where instead of air around the collapsed  lung(s), you may have blood or chyle, respectively.       6. Spinal headaches: They may occur with any procedures in the area of the spine.       7. Persistent CSF (Cerebro-Spinal Fluid) leakage: This is a rare problem, but may occur  with prolonged intrathecal or epidural catheters either due to the formation of a fistulous  track or a dural tear.       8. Nerve damage: By working so close to the spinal cord, there is always a possibility of  nerve damage, which could be as serious as a permanent spinal cord injury with  paralysis.       9. Death:  Although rare, severe deadly allergic reactions known as "Anaphylactic  reaction" can occur to any of the medications used.      10. Worsening of the symptoms:  We can always make thing worse.  What are the chances of something like this happening? Chances of any of this occuring are extremely low.  By statistics, you have more of a chance of getting killed in a motor vehicle accident: while driving to the hospital than any of the above occurring .  Nevertheless, you should be aware that they are possibilities.  In general, it is similar to taking a shower.  Everybody knows that you can slip, hit your head and get killed.  Does  that mean that you should not shower again?  Nevertheless always keep in mind that statistics do not mean anything if you happen to be on the wrong side of them.  Even if a procedure has a 1 (one) in a 1,000,000 (million) chance of going wrong, it you happen to be that one..Also, keep in mind that by statistics, you have more of a chance of having something go wrong when taking medications.  Who should not have this procedure? If you are on a blood thinning medication (e.g. Coumadin, Plavix, see list of "Blood Thinners"), or if you have an active infection going on, you should not  have the procedure.  If you are taking any blood thinners, please inform your physician.  How should I prepare for this procedure?  Do not eat or drink anything at least six hours prior to the procedure.  Bring a driver with you .  It cannot be a taxi.  Come accompanied by an adult that can drive you back, and that is strong enough to help you if your legs get weak or numb from the local anesthetic.  Take all of your medicines the morning of the procedure with just enough water to swallow them.  If you have diabetes, make sure that you are scheduled to have your procedure done first thing in the morning, whenever possible.  If you have diabetes, take only half of your insulin dose and notify our nurse that you have done so as soon as you arrive at the clinic.  If you are diabetic, but only take blood sugar pills (oral hypoglycemic), then do not take them on the morning of your procedure.  You may take them after you have had the procedure.  Do not take aspirin or any aspirin-containing medications, at least eleven (11) days prior to the procedure.  They may prolong bleeding.  Wear loose fitting clothing that may be easy to take off and that you would not mind if it got stained with Betadine or blood.  Do not wear any jewelry or perfume  Remove any nail coloring.  It will interfere with some of our monitoring equipment.  NOTE: Remember that this is not meant to be interpreted as a complete list of all possible complications.  Unforeseen problems may occur.  BLOOD THINNERS The following drugs contain aspirin or other products, which can cause increased bleeding during surgery and should not be taken for 2 weeks prior to and 1 week after surgery.  If you should need take something for relief of minor pain, you may take acetaminophen which is found in Tylenol,m Datril, Anacin-3 and Panadol. It is not blood thinner. The products listed below are.  Do not take any of the products listed below  in addition to any listed on your instruction sheet.  A.P.C or A.P.C with Codeine Codeine Phosphate Capsules #3 Ibuprofen Ridaura  ABC compound Congesprin Imuran rimadil  Advil Cope Indocin Robaxisal  Alka-Seltzer Effervescent Pain Reliever and Antacid Coricidin or Coricidin-D  Indomethacin Rufen  Alka-Seltzer plus Cold Medicine Cosprin Ketoprofen S-A-C Tablets  Anacin Analgesic Tablets or Capsules Coumadin Korlgesic Salflex  Anacin Extra Strength Analgesic tablets or capsules CP-2 Tablets Lanoril Salicylate  Anaprox Cuprimine Capsules Levenox Salocol  Anexsia-D Dalteparin Magan Salsalate  Anodynos Darvon compound Magnesium Salicylate Sine-off  Ansaid Dasin Capsules Magsal Sodium Salicylate  Anturane Depen Capsules Marnal Soma  APF Arthritis pain formula Dewitt's Pills Measurin Stanback  Argesic Dia-Gesic Meclofenamic Sulfinpyrazone  Arthritis Bayer Timed Release Aspirin Diclofenac Meclomen  Sulindac  Arthritis pain formula Anacin Dicumarol Medipren Supac  Analgesic (Safety coated) Arthralgen Diffunasal Mefanamic Suprofen  Arthritis Strength Bufferin Dihydrocodeine Mepro Compound Suprol  Arthropan liquid Dopirydamole Methcarbomol with Aspirin Synalgos  ASA tablets/Enseals Disalcid Micrainin Tagament  Ascriptin Doan's Midol Talwin  Ascriptin A/D Dolene Mobidin Tanderil  Ascriptin Extra Strength Dolobid Moblgesic Ticlid  Ascriptin with Codeine Doloprin or Doloprin with Codeine Momentum Tolectin  Asperbuf Duoprin Mono-gesic Trendar  Aspergum Duradyne Motrin or Motrin IB Triminicin  Aspirin plain, buffered or enteric coated Durasal Myochrisine Trigesic  Aspirin Suppositories Easprin Nalfon Trillsate  Aspirin with Codeine Ecotrin Regular or Extra Strength Naprosyn Uracel  Atromid-S Efficin Naproxen Ursinus  Auranofin Capsules Elmiron Neocylate Vanquish  Axotal Emagrin Norgesic Verin  Azathioprine Empirin or Empirin with Codeine Normiflo Vitamin E  Azolid Emprazil Nuprin Voltaren  Bayer  Aspirin plain, buffered or children's or timed BC Tablets or powders Encaprin Orgaran Warfarin Sodium  Buff-a-Comp Enoxaparin Orudis Zorpin  Buff-a-Comp with Codeine Equegesic Os-Cal-Gesic   Buffaprin Excedrin plain, buffered or Extra Strength Oxalid   Bufferin Arthritis Strength Feldene Oxphenbutazone   Bufferin plain or Extra Strength Feldene Capsules Oxycodone with Aspirin   Bufferin with Codeine Fenoprofen Fenoprofen Pabalate or Pabalate-SF   Buffets II Flogesic Panagesic   Buffinol plain or Extra Strength Florinal or Florinal with Codeine Panwarfarin   Buf-Tabs Flurbiprofen Penicillamine   Butalbital Compound Four-way cold tablets Penicillin   Butazolidin Fragmin Pepto-Bismol   Carbenicillin Geminisyn Percodan   Carna Arthritis Reliever Geopen Persantine   Carprofen Gold's salt Persistin   Chloramphenicol Goody's Phenylbutazone   Chloromycetin Haltrain Piroxlcam   Clmetidine heparin Plaquenil   Cllnoril Hyco-pap Ponstel   Clofibrate Hydroxy chloroquine Propoxyphen         Before stopping any of these medications, be sure to consult the physician who ordered them.  Some, such as Coumadin (Warfarin) are ordered to prevent or treat serious conditions such as "deep thrombosis", "pumonary embolisms", and other heart problems.  The amount of time that you may need off of the medication may also vary with the medication and the reason for which you were taking it.  If you are taking any of these medications, please make sure you notify your pain physician before you undergo any procedures.  Preparing for Procedure with Sedation Instructions: . Oral Intake: Do not eat or drink anything for at least 8 hours prior to your procedure. . Transportation: Public transportation is not allowed. Bring an adult driver. The driver must be physically present in our waiting room before any procedure can be started. Marland Kitchen Physical Assistance: Bring an adult physically capable of assisting you, in the event you  need help. This adult should keep you company at home for at least 6 hours after the procedure. . Blood Pressure Medicine: Take your blood pressure medicine with a sip of water the morning of the procedure. . Blood thinners:  . Diabetics on insulin: Notify the staff so that you can be scheduled 1st case in the morning. If your diabetes requires high dose insulin, take only  of your normal insulin dose the morning of the procedure and notify the staff that you have done so. . Preventing infections: Shower with an antibacterial soap the morning of your procedure. . Build-up your immune system: Take 1000 mg of Vitamin C with every meal (3 times a day) the day prior to your procedure. Marland Kitchen Antibiotics: Inform the staff if you have a condition or reason that requires you to take antibiotics before dental procedures. Marland Kitchen  Pregnancy: If you are pregnant, call and cancel the procedure. . Sickness: If you have a cold, fever, or any active infections, call and cancel the procedure. . Arrival: You must be in the facility at least 30 minutes prior to your scheduled procedure. . Children: Do not bring children with you. . Dress appropriately: Bring dark clothing that you would not mind if they get stained. . Valuables: Do not bring any jewelry or valuables. Procedure appointments are reserved for interventional treatments only. Marland Kitchen No Prescription Refills. . No medication changes will be discussed during procedure appointments. . No disability issues will be discussed.  ____________________________________________________________________________________________

## 2016-07-07 NOTE — Patient Instructions (Addendum)
Sacroiliac (SI) Joint Injection Patient Information  Description: The sacroiliac joint connects the scrum (very low back and tailbone) to the ilium (a pelvic bone which also forms half of the hip joint).  Normally this joint experiences very little motion.  When this joint becomes inflamed or unstable low back and or hip and pelvis pain may result.  Injection of this joint with local anesthetics (numbing medicines) and steroids can provide diagnostic information and reduce pain.  This injection is performed with the aid of x-ray guidance into the tailbone area while you are lying on your stomach.   You may experience an electrical sensation down the leg while this is being done.  You may also experience numbness.  We also may ask if we are reproducing your normal pain during the injection.  Conditions which may be treated SI injection:   Low back, buttock, hip or leg pain  Preparation for the Injection:  1. Do not eat any solid food or dairy products within 8 hours of your appointment.  2. You may drink clear liquids up to 3 hours before appointment.  Clear liquids include water, black coffee, juice or soda.  No milk or cream please. 3. You may take your regular medications, including pain medications with a sip of water before your appointment.  Diabetics should hold regular insulin (if take separately) and take 1/2 normal NPH dose the morning of the procedure.  Carry some sugar containing items with you to your appointment. 4. A driver must accompany you and be prepared to drive you home after your procedure. 5. Bring all of your current medications with you. 6. An IV may be inserted and sedation may be given at the discretion of the physician. 7. A blood pressure cuff, EKG and other monitors will often be applied during the procedure.  Some patients may need to have extra oxygen administered for a short period.  8. You will be asked to provide medical information, including your allergies,  prior to the procedure.  We must know immediately if you are taking blood thinners (like Coumadin/Warfarin) or if you are allergic to IV iodine contrast (dye).  We must know if you could possible be pregnant.  Possible side effects:   Bleeding from needle site  Infection (rare, may require surgery)  Nerve injury (rare)  Numbness & tingling (temporary)  A brief convulsion or seizure  Light-headedness (temporary)  Pain at injection site (several days)  Decreased blood pressure (temporary)  Weakness in the leg (temporary)   Call if you experience:   New onset weakness or numbness of an extremity below the injection site that last more than 8 hours.  Hives or difficulty breathing ( go to the emergency room)  Inflammation or drainage at the injection site  Any new symptoms which are concerning to you  Please note:  Although the local anesthetic injected can often make your back/ hip/ buttock/ leg feel good for several hours after the injections, the pain will likely return.  It takes 3-7 days for steroids to work in the sacroiliac area.  You may not notice any pain relief for at least that one week.  If effective, we will often do a series of three injections spaced 3-6 weeks apart to maximally decrease your pain.  After the initial series, we generally will wait some months before a repeat injection of the same type.  If you have any questions, please call [phone] [place]  The facet joints connect the bones of the spine (vertebrae). They make it possible for you to bend, twist, and make other movements with your spine. They also keep you from bending too far, twisting too far, and making other excessive movements. A facet joint block is a procedure where a numbing medicine (anesthetic) is injected into a facet joint. Often, a type of anti-inflammatory medicine called a steroid is also injected. A facet joint block  may be done to diagnose neck or back pain. If the pain gets better after a facet joint block, it means the pain is probably coming from the facet joint. If the pain does not get better, it means the pain is probably not coming from the facet joint. A facet joint block may also be done to relieve neck or back pain caused by an inflamed facet joint. A facet joint block is only done to relieve pain if the pain does not improve with other methods, such as medicine, exercise programs, and physical therapy. Tell a health care provider about:  Any allergies you have.  All medicines you are taking, including vitamins, herbs, eye drops, creams, and over-the-counter medicines.  Any problems you or family members have had with anesthetic medicines.  Any blood disorders you have.  Any surgeries you have had.  Any medical conditions you have.  Whether you are pregnant or may be pregnant. What are the risks? Generally, this is a safe procedure. However, problems may occur, including:  Bleeding.  Injury to a nerve near the injection site.  Pain at the injection site.  Weakness or numbness in areas controlled by nerves near the injection site.  Infection.  Temporary fluid retention.  Allergic reactions to medicines or dyes.  Injury to other structures or organs near the injection site. What happens before the procedure?  Follow instructions from your health care provider about eating or drinking restrictions.  Ask your health care provider about:  Changing or stopping your regular medicines. This is especially important if you are taking diabetes medicines or blood thinners.  Taking medicines such as aspirin and ibuprofen. These medicines can thin your blood. Do not take these medicines before your procedure if your health care provider instructs you not to.  Do not take any new dietary supplements or medicines without asking your health care provider first.  Plan to have someone take  you home after the procedure. What happens during the procedure?  You may need to remove your clothing and dress in an open-back gown.  The procedure will be done while you are lying on an X-ray table. You will most likely be asked to lie on your stomach, but you may be asked to lie in a different position if an injection will be made in your neck.  Machines will be used to monitor your oxygen levels, heart rate, and blood pressure.  If an injection will be made in your neck, an IV tube will be inserted into one of your veins. Fluids and medicine will flow directly into your body through the IV tube.  The area over the facet joint where the injection will be made will be cleaned with soap. The surrounding skin will be covered with clean drapes.  A numbing medicine (local anesthetic) will be applied to your skin. Your skin may sting or burn for a moment.  A video X-ray machine (fluoroscopy) will be used to locate the joint. In some cases, a CT scan may be used.  A contrast dye may  be injected into the facet joint area to help locate the joint.  When the joint is located, an anesthetic will be injected into the joint through the needle.  Your health care provider will ask you whether you feel pain relief. If you do feel relief, a steroid may be injected to provide pain relief for a longer period of time. If you do not feel relief or feel only partial relief, additional injections of an anesthetic may be made in other facet joints.  The needle will be removed.  Your skin will be cleaned.  A bandage (dressing) will be applied over each injection site. The procedure may vary among health care providers and hospitals. What happens after the procedure?  You will be observed for 15-30 minutes before being allowed to go home. This information is not intended to replace advice given to you by your health care provider. Make sure you discuss any questions you have with your health care  provider. Document Released: 08/04/2006 Document Revised: 04/16/2015 Document Reviewed: 12/09/2014 Elsevier Interactive Patient Education  2017 [place]  What are the risk, side effects and possible complications? Generally speaking, most procedures are safe.  However, with any procedure there are risks, side effects, and the possibility of complications.  The risks and complications are dependent upon the sites that are lesioned, or the type of nerve block to be performed.  The closer the procedure is to the spine, the more serious the risks are.  Great care is taken when placing the radio frequency needles, block needles or lesioning probes, but sometimes complications can occur. 1. Infection: Any time there is an injection through the skin, there is a risk of infection.  This is why sterile conditions are used for these blocks.  There are four possible types of infection. 1. Localized skin infection. 2. Central Nervous System Infection-This can be in the form of Meningitis, which can be deadly. 3. Epidural Infections-This can be in the form of an epidural abscess, which can cause pressure inside of the spine, causing compression of the spinal cord with subsequent paralysis. This would require an emergency surgery to decompress, and there are no guarantees that the patient would recover from the paralysis. 4. Discitis-This is an infection of the intervertebral discs.  It occurs in about 1% of discography procedures.  It is difficult to treat and it may lead to surgery.        2. Pain: the needles have to go through skin and soft tissues, will cause soreness.       3. Damage to internal structures:  The nerves to be lesioned may be near blood vessels or    other nerves which can be potentially damaged.       4. Bleeding: Bleeding is more common if the patient is taking blood thinners such as  aspirin, Coumadin, Ticiid, Plavix, etc., or if he/she have some  genetic predisposition  such as hemophilia. Bleeding into the spinal canal can cause compression of the spinal  cord with subsequent paralysis.  This would require an emergency surgery to  decompress and there are no guarantees that the patient would recover from the  paralysis.       5. Pneumothorax:  Puncturing of a lung is a possibility, every time a needle is introduced in  the area of the chest or upper back.  Pneumothorax refers to free air around the  collapsed lung(s), inside of the thoracic cavity (chest cavity).  Another two  possible  complications related to a similar event would include: Hemothorax and Chylothorax.   These are variations of the Pneumothorax, where instead of air around the collapsed  lung(s), you may have blood or chyle, respectively.       6. Spinal headaches: They may occur with any procedures in the area of the spine.       7. Persistent CSF (Cerebro-Spinal Fluid) leakage: This is a rare problem, but may occur  with prolonged intrathecal or epidural catheters either due to the formation of a fistulous  track or a dural tear.       8. Nerve damage: By working so close to the spinal cord, there is always a possibility of  nerve damage, which could be as serious as a permanent spinal cord injury with  paralysis.       9. Death:  Although rare, severe deadly allergic reactions known as "Anaphylactic  reaction" can occur to any of the medications used.      10. Worsening of the symptoms:  We can always make thing worse.  What are the chances of something like this happening? Chances of any of this occuring are extremely low.  By statistics, you have more of a chance of getting killed in a motor vehicle accident: while driving to the hospital than any of the above occurring .  Nevertheless, you should be aware that they are possibilities.  In general, it is similar to taking a shower.  Everybody knows that you can slip, hit your head and get killed.  Does that mean that you should  not shower again?  Nevertheless always keep in mind that statistics do not mean anything if you happen to be on the wrong side of them.  Even if a procedure has a 1 (one) in a 1,000,000 (million) chance of going wrong, it you happen to be that one..Also, keep in mind that by statistics, you have more of a chance of having something go wrong when taking medications.  Who should not have this procedure? If you are on a blood thinning medication (e.g. Coumadin, Plavix, see list of "Blood Thinners"), or if you have an active infection going on, you should not have the procedure.  If you are taking any blood thinners, please inform your physician.  How should I prepare for this procedure?  Do not eat or drink anything at least six hours prior to the procedure.  Bring a driver with you .  It cannot be a taxi.  Come accompanied by an adult that can drive you back, and that is strong enough to help you if your legs get weak or numb from the local anesthetic.  Take all of your medicines the morning of the procedure with just enough water to swallow them.  If you have diabetes, make sure that you are scheduled to have your procedure done first thing in the morning, whenever possible.  If you have diabetes, take only half of your insulin dose and notify our nurse that you have done so as soon as you arrive at the clinic.  If you are diabetic, but only take blood sugar pills (oral hypoglycemic), then do not take them on the morning of your procedure.  You may take them after you have had the procedure.  Do not take aspirin or any aspirin-containing medications, at least eleven (11) days prior to the procedure.  They may prolong bleeding.  Wear loose fitting clothing that may be easy to take off and  that you would not mind if it got stained with Betadine or blood.  Do not wear any jewelry or perfume  Remove any nail coloring.  It will interfere with some of our monitoring equipment.  NOTE: Remember  that this is not meant to be interpreted as a complete list of all possible complications.  Unforeseen problems may occur.  BLOOD THINNERS The following drugs contain aspirin or other products, which can cause increased bleeding during surgery and should not be taken for 2 weeks prior to and 1 week after surgery.  If you should need take something for relief of minor pain, you may take acetaminophen which is found in Tylenol,m Datril, Anacin-3 and Panadol. It is not blood thinner. The products listed below are.  Do not take any of the products listed below in addition to any listed on your instruction sheet.  A.P.C or A.P.C with Codeine Codeine Phosphate Capsules #3 Ibuprofen Ridaura  ABC compound Congesprin Imuran rimadil  Advil Cope Indocin Robaxisal  Alka-Seltzer Effervescent Pain Reliever and Antacid Coricidin or Coricidin-D  Indomethacin Rufen  Alka-Seltzer plus Cold Medicine Cosprin Ketoprofen S-A-C Tablets  Anacin Analgesic Tablets or Capsules Coumadin Korlgesic Salflex  Anacin Extra Strength Analgesic tablets or capsules CP-2 Tablets Lanoril Salicylate  Anaprox Cuprimine Capsules Levenox Salocol  Anexsia-D Dalteparin Magan Salsalate  Anodynos Darvon compound Magnesium Salicylate Sine-off  Ansaid Dasin Capsules Magsal Sodium Salicylate  Anturane Depen Capsules Marnal Soma  APF Arthritis pain formula Dewitt's Pills Measurin Stanback  Argesic Dia-Gesic Meclofenamic Sulfinpyrazone  Arthritis Bayer Timed Release Aspirin Diclofenac Meclomen Sulindac  Arthritis pain formula Anacin Dicumarol Medipren Supac  Analgesic (Safety coated) Arthralgen Diffunasal Mefanamic Suprofen  Arthritis Strength Bufferin Dihydrocodeine Mepro Compound Suprol  Arthropan liquid Dopirydamole Methcarbomol with Aspirin Synalgos  ASA tablets/Enseals Disalcid Micrainin Tagament  Ascriptin Doan's Midol Talwin  Ascriptin A/D Dolene Mobidin Tanderil  Ascriptin Extra Strength Dolobid Moblgesic Ticlid  Ascriptin with  Codeine Doloprin or Doloprin with Codeine Momentum Tolectin  Asperbuf Duoprin Mono-gesic Trendar  Aspergum Duradyne Motrin or Motrin IB Triminicin  Aspirin plain, buffered or enteric coated Durasal Myochrisine Trigesic  Aspirin Suppositories Easprin Nalfon Trillsate  Aspirin with Codeine Ecotrin Regular or Extra Strength Naprosyn Uracel  Atromid-S Efficin Naproxen Ursinus  Auranofin Capsules Elmiron Neocylate Vanquish  Axotal Emagrin Norgesic Verin  Azathioprine Empirin or Empirin with Codeine Normiflo Vitamin E  Azolid Emprazil Nuprin Voltaren  Bayer Aspirin plain, buffered or children's or timed BC Tablets or powders Encaprin Orgaran Warfarin Sodium  Buff-a-Comp Enoxaparin Orudis Zorpin  Buff-a-Comp with Codeine Equegesic Os-Cal-Gesic   Buffaprin Excedrin plain, buffered or Extra Strength Oxalid   Bufferin Arthritis Strength Feldene Oxphenbutazone   Bufferin plain or Extra Strength Feldene Capsules Oxycodone with Aspirin   Bufferin with Codeine Fenoprofen Fenoprofen Pabalate or Pabalate-SF   Buffets II Flogesic Panagesic   Buffinol plain or Extra Strength Florinal or Florinal with Codeine Panwarfarin   Buf-Tabs Flurbiprofen Penicillamine   Butalbital Compound Four-way cold tablets Penicillin   Butazolidin Fragmin Pepto-Bismol   Carbenicillin Geminisyn Percodan   Carna Arthritis Reliever Geopen Persantine   Carprofen Gold's salt Persistin   Chloramphenicol Goody's Phenylbutazone   Chloromycetin Haltrain Piroxlcam   Clmetidine heparin Plaquenil   Cllnoril Hyco-pap Ponstel   Clofibrate Hydroxy chloroquine Propoxyphen         Before stopping any of these medications, be sure to consult the physician who ordered them.  Some, such as Coumadin (Warfarin) are ordered to prevent or treat serious conditions such as "deep thrombosis", "pumonary embolisms", and other  heart problems.  The amount of time that you may need off of the medication may also vary with the medication and the reason for  which you were taking it.  If you are taking any of these medications, please make sure you notify your pain physician before you undergo any procedures.  Preparing for Procedure with Sedation Instructions: . Oral Intake: Do not eat or drink anything for at least 8 hours prior to your procedure. . Transportation: Public transportation is not allowed. Bring an adult driver. The driver must be physically present in our waiting room before any procedure can be started. Marland Kitchen Physical Assistance: Bring an adult physically capable of assisting you, in the event you need help. This adult should keep you company at home for at least 6 hours after the procedure. . Blood Pressure Medicine: Take your blood pressure medicine with a sip of water the morning of the procedure. . Blood thinners:  . Diabetics on insulin: Notify the staff so that you can be scheduled 1st case in the morning. If your diabetes requires high dose insulin, take only  of your normal insulin dose the morning of the procedure and notify the staff that you have done so. . Preventing infections: Shower with an antibacterial soap the morning of your procedure. . Build-up your immune system: Take 1000 mg of Vitamin C with every meal (3 times a day) the day prior to your procedure. Marland Kitchen Antibiotics: Inform the staff if you have a condition or reason that requires you to take antibiotics before dental procedures. . Pregnancy: If you are pregnant, call and cancel the procedure. . Sickness: If you have a cold, fever, or any active infections, call and cancel the procedure. . Arrival: You must be in the facility at least 30 minutes prior to your scheduled procedure. . Children: Do not bring children with you. . Dress appropriately: Bring dark clothing that you would not mind if they get stained. . Valuables: Do not bring any jewelry or valuables. Procedure appointments are reserved for interventional treatments only. Marland Kitchen No Prescription Refills. . No  medication changes will be discussed during procedure appointments. . No disability issues will be discussed.  ____________________________________________________________________________________________

## 2016-07-07 NOTE — Progress Notes (Signed)
Safety precautions to be maintained throughout the outpatient stay will include: orient to surroundings, keep bed in low position, maintain call bell within reach at all times, provide assistance with transfer out of bed and ambulation.  

## 2016-07-08 ENCOUNTER — Inpatient Hospital Stay: Payer: Medicare Other | Attending: Oncology

## 2016-07-08 ENCOUNTER — Inpatient Hospital Stay: Payer: Medicare Other | Attending: Oncology | Admitting: Oncology

## 2016-07-08 ENCOUNTER — Inpatient Hospital Stay: Payer: Medicare Other

## 2016-07-08 VITALS — BP 124/83 | HR 88 | Temp 98.5°F | Resp 18 | Wt 146.1 lb

## 2016-07-08 VITALS — BP 120/75 | HR 82 | Resp 20

## 2016-07-08 DIAGNOSIS — F17210 Nicotine dependence, cigarettes, uncomplicated: Secondary | ICD-10-CM

## 2016-07-08 DIAGNOSIS — D500 Iron deficiency anemia secondary to blood loss (chronic): Secondary | ICD-10-CM

## 2016-07-08 DIAGNOSIS — I10 Essential (primary) hypertension: Secondary | ICD-10-CM | POA: Diagnosis not present

## 2016-07-08 DIAGNOSIS — K259 Gastric ulcer, unspecified as acute or chronic, without hemorrhage or perforation: Secondary | ICD-10-CM | POA: Diagnosis not present

## 2016-07-08 DIAGNOSIS — F419 Anxiety disorder, unspecified: Secondary | ICD-10-CM | POA: Diagnosis not present

## 2016-07-08 DIAGNOSIS — M797 Fibromyalgia: Secondary | ICD-10-CM | POA: Diagnosis not present

## 2016-07-08 DIAGNOSIS — K219 Gastro-esophageal reflux disease without esophagitis: Secondary | ICD-10-CM | POA: Diagnosis not present

## 2016-07-08 DIAGNOSIS — D509 Iron deficiency anemia, unspecified: Secondary | ICD-10-CM

## 2016-07-08 DIAGNOSIS — K7460 Unspecified cirrhosis of liver: Secondary | ICD-10-CM | POA: Diagnosis not present

## 2016-07-08 DIAGNOSIS — G2581 Restless legs syndrome: Secondary | ICD-10-CM | POA: Diagnosis not present

## 2016-07-08 DIAGNOSIS — Z79899 Other long term (current) drug therapy: Secondary | ICD-10-CM

## 2016-07-08 DIAGNOSIS — K5730 Diverticulosis of large intestine without perforation or abscess without bleeding: Secondary | ICD-10-CM

## 2016-07-08 DIAGNOSIS — E559 Vitamin D deficiency, unspecified: Secondary | ICD-10-CM | POA: Diagnosis not present

## 2016-07-08 DIAGNOSIS — Z7984 Long term (current) use of oral hypoglycemic drugs: Secondary | ICD-10-CM | POA: Diagnosis not present

## 2016-07-08 DIAGNOSIS — E119 Type 2 diabetes mellitus without complications: Secondary | ICD-10-CM | POA: Diagnosis not present

## 2016-07-08 DIAGNOSIS — M5116 Intervertebral disc disorders with radiculopathy, lumbar region: Secondary | ICD-10-CM | POA: Diagnosis not present

## 2016-07-08 LAB — IRON AND TIBC
Iron: 24 ug/dL — ABNORMAL LOW (ref 28–170)
Saturation Ratios: 4 % — ABNORMAL LOW (ref 10.4–31.8)
TIBC: 566 ug/dL — ABNORMAL HIGH (ref 250–450)
UIBC: 542 ug/dL

## 2016-07-08 LAB — FERRITIN: Ferritin: 8 ng/mL — ABNORMAL LOW (ref 11–307)

## 2016-07-08 LAB — CBC
HCT: 31.3 % — ABNORMAL LOW (ref 35.0–47.0)
Hemoglobin: 10.5 g/dL — ABNORMAL LOW (ref 12.0–16.0)
MCH: 28 pg (ref 26.0–34.0)
MCHC: 33.4 g/dL (ref 32.0–36.0)
MCV: 84 fL (ref 80.0–100.0)
Platelets: 224 10*3/uL (ref 150–440)
RBC: 3.73 MIL/uL — ABNORMAL LOW (ref 3.80–5.20)
RDW: 17.4 % — ABNORMAL HIGH (ref 11.5–14.5)
WBC: 6.7 10*3/uL (ref 3.6–11.0)

## 2016-07-08 MED ORDER — SODIUM CHLORIDE 0.9 % IV SOLN
Freq: Once | INTRAVENOUS | Status: AC
  Administered 2016-07-08: 15:00:00 via INTRAVENOUS
  Filled 2016-07-08: qty 1000

## 2016-07-08 MED ORDER — SODIUM CHLORIDE 0.9 % IV SOLN
510.0000 mg | Freq: Once | INTRAVENOUS | Status: AC
  Administered 2016-07-08: 510 mg via INTRAVENOUS
  Filled 2016-07-08: qty 17

## 2016-07-08 NOTE — Progress Notes (Signed)
Hematology/Oncology Consult note [facility]  Telephone:([phone] [phone]  Patient Care Team: Jodi Marble, MD as PCP - General (Internal Medicine)   Name of the patient: Audrey Garrett  [id number]  1961-03-14   Date of visit: 07/08/16  Diagnosis- iron deficiency anemia unclear etiology  Chief complaint/ Reason for visit- routine f/u  Heme/Onc history: Patient is a 56 year old female who was last seen by Dr. Alvy Bimler in jan 2018 for [place]. Patient has liver cirrhosis and is currently receiving Harvoni for hepatitis C. GI evaluation is as follows:  Upper EGD on 04/01/16 showed multiple dispersed diminutive erosions were found in the gastric antrum. There were no stigmata of recent bleeding. One healing cratered gastric ulcer with no stigmata of bleeding was found in the prepyloric region of the stomach. The lesion was 2 mm in largest dimension. Colonoscopy on 04/01/16 showed multiple small-mouthed diverticula were found in the sigmoid colon and descending colon. A single small localized angioectasia with typical arborization was found in the proximal ascending colon. Coagulation for tissue destruction using argon plasma at 0.5 liters/minute and 20 watts was successful.  She has received significant blood transfusions recently since March 2017. Her last blood transfusion was on 03/17/16. She had 2 units of blood She received 4 doses of IV Venofer  In Jan 2018 she received blood trasnsfusion followed by ferraheme.   During her visit in February 2018 her H&H was 10.3/ 31.6. IV iron was held off and she was supposed to get cbc checked in1 month. Patient missed that appointment and became progressively iron deficient again when her hb dropped to 7.9. She received 1 unit of blood transfusion  Interval history- Patient states she occasionally gets diverticular bleed every once in a while and she has been told that it has been the cause of her iron deficiency.  She is concerned about her weight loss. Also her energy levels are no better with or without iron  ECOG PS- 1 Pain scale- 0 Opioid associated constipation- n/a  Review of systems- Review of Systems  Constitutional: Positive for malaise/fatigue and weight loss. Negative for chills and fever.  HENT: Negative for congestion, ear discharge and nosebleeds.   Eyes: Negative for blurred vision.  Respiratory: Negative for cough, hemoptysis, sputum production, shortness of breath and wheezing.   Cardiovascular: Negative for chest pain, palpitations, orthopnea and claudication.  Gastrointestinal: Negative for abdominal pain, blood in stool, constipation, diarrhea, heartburn, melena, nausea and vomiting.  Genitourinary: Negative for dysuria, flank pain, frequency, hematuria and urgency.  Musculoskeletal: Negative for back pain, joint pain and myalgias.  Skin: Negative for rash.  Neurological: Negative for dizziness, tingling, focal weakness, seizures, weakness and headaches.  Endo/Heme/Allergies: Does not bruise/bleed easily.  Psychiatric/Behavioral: Negative for depression and suicidal ideas. The patient does not have insomnia.      Current treatment- IV iron  No Known Allergies   Past Medical History:  Diagnosis Date  . Alcohol abuse   . Alcoholic cirrhosis of liver without ascites ([place])   . Anemia   . Anxiety   . Arthropathy   . Back pain   . Bronchitis   . BRONCHITIS, ACUTE 11/01/2009   Qualifier: Diagnosis of  By: Alveta Heimlich MD, Cornelia Copa    . Chronic hepatitis C ([place])   . Chronic LBP 12/25/2014   Overview:  Post extensive surgery   . Cirrhosis ([place])   . DDD (degenerative disc disease), lumbar 01/01/2015  . Depression   . Diabetes mellitus without  complication ([place])   . Diverticulosis   . Edema   . Fatigue   . Fibromyalgia   . GERD (gastroesophageal reflux disease)    gastroparesis  . High risk medications (not anticoagulants) long-term use   . Hyperglycemia   . Hypertension   .  Kidney mass    11/17 small mass-  . Left leg pain 01/01/2015  . Lumbago   . Lumbar radicular pain 01/01/2015  . Muscle spasm   . Neck pain 01/01/2015  . Polyarthritis   . Reflux   . Restless leg syndrome    01/2016  . RLS (restless legs syndrome)   . Sacroiliac pain 01/01/2015  . Shoulder pain   . Sinusitis   . SOB (shortness of breath)   . Spine disorder   . Stress headaches   . Upper back pain 01/01/2015  . Urinary frequency   . Vitamin D deficiency disease      Past Surgical History:  Procedure Laterality Date  . BACK SURGERY  12/19/2011  . BACK SURGERY    . COLONOSCOPY WITH PROPOFOL N/A 09/25/2015   Procedure: COLONOSCOPY WITH PROPOFOL;  Surgeon: Lollie Sails, MD;  Location: [facility] ENDOSCOPY;  Service: Endoscopy;  Laterality: N/A;  . COLONOSCOPY WITH PROPOFOL N/A 04/01/2016   Procedure: COLONOSCOPY WITH PROPOFOL;  Surgeon: Lollie Sails, MD;  Location: [facility] ENDOSCOPY;  Service: Endoscopy;  Laterality: N/A;  . ESOPHAGOGASTRODUODENOSCOPY N/A 04/14/2015   Procedure: ESOPHAGOGASTRODUODENOSCOPY (EGD);  Surgeon: Lollie Sails, MD;  Location: [facility] ENDOSCOPY;  Service: Endoscopy;  Laterality: N/A;  . ESOPHAGOGASTRODUODENOSCOPY (EGD) WITH PROPOFOL N/A 09/16/2015   Procedure: ESOPHAGOGASTRODUODENOSCOPY (EGD) WITH PROPOFOL;  Surgeon: Lollie Sails, MD;  Location: [facility] ENDOSCOPY;  Service: Endoscopy;  Laterality: N/A;  . ESOPHAGOGASTRODUODENOSCOPY (EGD) WITH PROPOFOL N/A 01/22/2016   Procedure: ESOPHAGOGASTRODUODENOSCOPY (EGD) WITH PROPOFOL;  Surgeon: [name], MD;  Location: [place];  Service: Endoscopy;  Laterality: N/A;  . ESOPHAGOGASTRODUODENOSCOPY (EGD) WITH PROPOFOL N/A 04/01/2016   Procedure: ESOPHAGOGASTRODUODENOSCOPY (EGD) WITH PROPOFOL;  Surgeon: Lollie Sails, MD;  Location: [facility] ENDOSCOPY;  Service: Endoscopy;  Laterality: N/A;  . HERNIA REPAIR N/A 10/2015   abdominal   . TONSILLECTOMY    . TUBAL LIGATION    . UMBILICAL HERNIA REPAIR N/A 11/06/2015     Procedure: HERNIA REPAIR UMBILICAL ADULT;  Surgeon: [name], MD;  Location: [facility] ORS;  Service: General;  Laterality: N/A;    Social History   Social History  . Marital status: Married    Spouse name: N/A  . Number of children: N/A  . Years of education: N/A   Occupational History  . Not on file.   Social History Main Topics  . Smoking status: Current Every Day Smoker    Packs/day: 0.50    Years: 40.00    Types: Cigarettes  . Smokeless tobacco: Never Used  . Alcohol use No     Comment: stopped drinking 05/12/15  . Drug use: No  . Sexual activity: Not on file   Other Topics Concern  . Not on file   Social History Narrative   Lives at home with husband    Family History  Problem Relation Age of Onset  . Stroke Mother   . Heart disease Mother   . [name] Father   . Breast cancer Sister 36  . Kidney disease Neg Hx   . Bladder Cancer Neg Hx   . Kidney cancer Neg Hx   . Prostate cancer Neg Hx      Current  Outpatient Prescriptions:  .  calcium carbonate (TUMS - DOSED IN MG ELEMENTAL CALCIUM) 500 MG chewable tablet, Chew 2 tablets by mouth 2 (two) times daily., Disp: , Rfl:  .  ciprofloxacin (CIPRO) 500 MG tablet, Take 500 mg by mouth 2 (two) times a week. Monday and friday, Disp: , Rfl:  .  furosemide (LASIX) 40 MG tablet, Take by mouth 2 (two) times daily. , Disp: , Rfl:  .  lactulose (CHRONULAC) 10 GM/15ML solution, Take 10 g by mouth 3 (three) times daily. , Disp: , Rfl:  .  Magnesium Oxide 250 MG TABS, Take 1 tablet by mouth daily., Disp: , Rfl:  .  Multiple Vitamin (MULTIVITAMIN WITH MINERALS) TABS tablet, Take 1 tablet by mouth daily., Disp: , Rfl:  .  omeprazole (PRILOSEC) 20 MG capsule, Take 20 mg by mouth daily., Disp: , Rfl:  .  Oxycodone HCl 20 MG TABS, Take 1 tablet (20 mg total) by mouth every 6 (six) hours as needed., Disp: 120 tablet, Rfl: 0 .  [START ON 07/26/2016] Oxycodone HCl 20 MG TABS, Take 1 tablet (20 mg total) by mouth every 6 (six)  hours as needed., Disp: 120 tablet, Rfl: 0 .  PARoxetine (PAXIL) 40 MG tablet, Take 40 mg by mouth every morning., Disp: , Rfl: 2 .  phytonadione (VITAMIN K) 5 MG tablet, Take 5 mg by mouth 3 (three) times a week. Mon, Wed, Fri, Disp: , Rfl:  .  pregabalin (LYRICA) 50 MG capsule, Take by mouth 2 (two) times daily. , Disp: , Rfl:  .  pregabalin (LYRICA) 75 MG capsule, Take 75 mg by mouth 2 (two) times daily., Disp: , Rfl:  .  spironolactone (ALDACTONE) 50 MG tablet, Take 100 mg by mouth daily. , Disp: , Rfl: 0 .  sucralfate (CARAFATE) 1 GM/10ML suspension, Take 1 g by mouth 3 (three) times daily as needed. , Disp: , Rfl:  .  thiamine (VITAMIN B-1) 100 MG tablet, Take 1 tablet (100 mg total) by mouth daily., Disp: 90 tablet, Rfl: 6 .  traZODone (DESYREL) 50 MG tablet, Take 50 mg by mouth at bedtime., Disp: , Rfl:  .  Oxycodone HCl 20 MG TABS, Take 1 tablet (20 mg total) by mouth every 6 (six) hours., Disp: 120 tablet, Rfl: 0  Physical exam:  Vitals:   07/08/16 1040  BP: 124/83  Pulse: 88  Resp: 18  Temp: 98.5 F (36.9 C)  TempSrc: Tympanic  Weight: 146 lb 2 oz (66.3 kg)   Physical Exam  Constitutional: She is oriented to person, place, and time.  Thin, fatigued, in no acute distress  HENT:  Head: Normocephalic and atraumatic.  Eyes: EOM are normal. Pupils are equal, round, and reactive to light.  Neck: Normal range of motion.  Cardiovascular: Normal rate, regular rhythm and normal heart sounds.   Pulmonary/Chest: Effort normal and breath sounds normal.  Abdominal: Soft. Bowel sounds are normal.  Neurological: She is alert and oriented to person, place, and time.  Skin: Skin is warm and dry.     CMP Latest Ref Rng & Units 04/12/2016  Glucose 65 - 99 mg/dL 101(H)  BUN 6 - 20 mg/dL 7  Creatinine 0.44 - 1.00 mg/dL 0.69  Sodium 135 - 145 mmol/L 130(L)  Potassium 3.5 - 5.1 mmol/L 3.4(L)  Chloride 101 - 111 mmol/L 97(L)  CO2 22 - 32 mmol/L 26  Calcium 8.9 - 10.3 mg/dL 9.3  Total  Protein 6.5 - 8.1 g/dL 7.3  Total Bilirubin 0.3 - 1.2  mg/dL 0.7  Alkaline Phos 38 - 126 U/L 59  AST 15 - 41 U/L 20  ALT 14 - 54 U/L 9(L)   CBC Latest Ref Rng & Units 07/08/2016  WBC 3.6 - 11.0 K/uL 6.7  Hemoglobin 12.0 - 16.0 g/dL 10.5(L)  Hematocrit 35.0 - 47.0 % 31.3(L)  Platelets 150 - 440 K/uL 224    No images are attached to the encounter.  Dg C-arm 1-60 Min-no Report  Result Date: 06/09/2016 Fluoroscopy was utilized by the requesting physician.  No radiographic interpretation.     Assessment and plan- Patient is a 56 y.o. female who sees me for iron deficiency anemia  1. H/H improved s/p transfusion. She will receieve 2 doses of feraheme this week and next week. Repeat cbc and iron studies in 1 month and see me at that time. Her acute onset iron deficiency anemia in 1-2 months is very concerning for GI bleeding. She has had endoscopy and colonoscopy in jan 2018 which did not reveal source of bleeding. However, given the severity of her iron deficiency anemia, I strongly recommend that she should get capsule study by GI to r/o small bowel bleed.   2. h/o cirrhosis- she will need routine surveillance for [facility] with GI  3. Tobacco dependence- I will discuss low dose screening CT scan with next visit   Visit Diagnosis 1. Iron deficiency anemia due to chronic blood loss      Dr. Randa Evens, MD, MPH [facility] Pager- [phone] 07/08/2016 1:31 PM

## 2016-07-08 NOTE — Progress Notes (Signed)
Patient states she has chronic back pain. Goes to pain clinic.  States she has severe leg cramps.  Patient states she continues to stay fatigued in spite of iron infusions and pRBC's.

## 2016-07-09 ENCOUNTER — Ambulatory Visit: Payer: Medicare Other

## 2016-07-09 ENCOUNTER — Ambulatory Visit: Payer: Medicare Other | Admitting: Oncology

## 2016-07-09 ENCOUNTER — Inpatient Hospital Stay: Payer: Medicare Other

## 2016-07-12 ENCOUNTER — Ambulatory Visit
Admission: RE | Admit: 2016-07-12 | Discharge: 2016-07-12 | Disposition: A | Discharge status: Home / Self Care | Payer: Medicare Other | Source: Ambulatory Visit | Attending: Pain Medicine | Admitting: Pain Medicine

## 2016-07-12 ENCOUNTER — Encounter: Payer: Self-pay | Admitting: Pain Medicine

## 2016-07-12 ENCOUNTER — Ambulatory Visit (HOSPITAL_BASED_OUTPATIENT_CLINIC_OR_DEPARTMENT_OTHER): Payer: Medicare Other | Admitting: Pain Medicine

## 2016-07-12 VITALS — BP 104/51 | HR 77 | Temp 98.1°F | Resp 16 | Ht 70.0 in | Wt 147.0 lb

## 2016-07-12 DIAGNOSIS — M160 Bilateral primary osteoarthritis of hip: Secondary | ICD-10-CM | POA: Diagnosis present

## 2016-07-12 DIAGNOSIS — M25559 Pain in unspecified hip: Secondary | ICD-10-CM | POA: Insufficient documentation

## 2016-07-12 DIAGNOSIS — G8929 Other chronic pain: Secondary | ICD-10-CM | POA: Insufficient documentation

## 2016-07-12 MED ORDER — MIDAZOLAM HCL 5 MG/5ML IJ SOLN
1.0000 mg | INTRAMUSCULAR | Status: DC | PRN
  Administered 2016-07-12: 2 mg via INTRAVENOUS
  Filled 2016-07-12: qty 5

## 2016-07-12 MED ORDER — ROPIVACAINE HCL 2 MG/ML IJ SOLN
9.0000 mL | Freq: Once | INTRAMUSCULAR | Status: AC
  Administered 2016-07-12: 9 mL via INTRA_ARTICULAR
  Filled 2016-07-12: qty 10

## 2016-07-12 MED ORDER — METHYLPREDNISOLONE ACETATE 80 MG/ML IJ SUSP
80.0000 mg | Freq: Once | INTRAMUSCULAR | Status: AC
  Administered 2016-07-12: 80 mg via INTRA_ARTICULAR
  Filled 2016-07-12: qty 1

## 2016-07-12 MED ORDER — IOPAMIDOL (ISOVUE-M 200) INJECTION 41%
10.0000 mL | Freq: Once | INTRAMUSCULAR | Status: AC
  Administered 2016-07-12: 10 mL via INTRA_ARTICULAR
  Filled 2016-07-12: qty 10

## 2016-07-12 MED ORDER — LIDOCAINE HCL (PF) 1 % IJ SOLN: 10.0000 mL | Freq: Once | INTRAMUSCULAR | Status: DC

## 2016-07-12 MED ORDER — LACTATED RINGERS IV SOLN
1000.0000 mL | Freq: Once | INTRAVENOUS | Status: AC
  Administered 2016-07-12: 1000 mL via INTRAVENOUS

## 2016-07-12 MED ORDER — FENTANYL CITRATE (PF) 100 MCG/2ML IJ SOLN
25.0000 ug | INTRAMUSCULAR | Status: DC | PRN
  Administered 2016-07-12: 50 ug via INTRAVENOUS
  Filled 2016-07-12: qty 2

## 2016-07-12 NOTE — Progress Notes (Signed)
Safety precautions to be maintained throughout the outpatient stay will include: orient to surroundings, keep bed in low position, maintain call bell within reach at all times, provide assistance with transfer out of bed and ambulation.  

## 2016-07-12 NOTE — Patient Instructions (Addendum)
Post-Procedure instructions Instructions:  Apply ice: Fill a plastic sandwich bag with crushed ice. Cover it with a small towel and apply to injection site. Apply for 15 minutes then remove x 15 minutes. Repeat sequence on day of procedure, until you go to bed. The purpose is to minimize swelling and discomfort after procedure.  Apply heat: Apply heat to procedure site starting the day following the procedure. The purpose is to treat any soreness and discomfort from the procedure.  Food intake: Start with clear liquids (like water) and advance to regular food, as tolerated.   Physical activities: Keep activities to a minimum for the first 8 hours after the procedure.   Driving: If you have received any sedation, you are not allowed to drive for 24 hours after your procedure.  Blood thinner: Restart your blood thinner 6 hours after your procedure. (Only for those taking blood thinners)  Insulin: As soon as you can eat, you may resume your normal dosing schedule. (Only for those taking insulin)  Infection prevention: Keep procedure site clean and dry.  Post-procedure Pain Diary: Extremely important that this be done correctly and accurately. Recorded information will be used to determine the next step in treatment.  Pain evaluated is that of treated area only. Do not include pain from an untreated area.  Complete every hour, on the hour, for the initial 8 hours. Set an alarm to help you do this part accurately.  Do not go to sleep and have it completed later. It will not be accurate.  Follow-up appointment: Keep your follow-up appointment after the procedure. Usually 2 weeks for most procedures. (6 weeks in the case of radiofrequency.) Bring you pain diary.  Expect:  From numbing medicine (AKA: Local Anesthetics): Numbness or decrease in pain.  Onset: Full effect within 15 minutes of injected.  Duration: It will depend on the type of local anesthetic used. On the average, 1 to 8  hours.   From steroids: Decrease in swelling or inflammation. Once inflammation is improved, relief of the pain will follow.  Onset of benefits: Depends on the amount of swelling present. The more swelling, the longer it will take for the benefits to be seen.   Duration: Steroids will stay in the system x 2 weeks. Duration of benefits will depend on multiple posibilities including persistent irritating factors.  From procedure: Some discomfort is to be expected once the numbing medicine wears off. This should be minimal if ice and heat are applied as instructed. Call if:  You experience numbness and weakness that gets worse with time, as opposed to wearing off.  New onset bowel or bladder incontinence. (Spinal procedures only)  Emergency Numbers:  Durning business hours (Monday - Thursday, 8:00 AM - 4:00 PM) (Friday, 9:00 AM - 12:00 Noon): [phone]  After hours: [phone]   __________________________________________________________________________________________   Pain Management Discharge Instructions  General Discharge Instructions :  If you need to reach your doctor call: Monday-Friday 8:00 am - 4:00 pm at [phone] or toll free [phone].  After clinic hours [phone] to have operator reach doctor.  Bring all of your medication bottles to all your appointments in the pain clinic.  To cancel or reschedule your appointment with Pain Management please remember to call 24 hours in advance to avoid a fee.  Refer to the educational materials which you have been given on: General Risks, I had my Procedure. Discharge Instructions, Post Sedation.  Post Procedure Instructions:  The drugs you were given will stay in   your system until tomorrow, so for the next 24 hours you should not drive, make any legal decisions or drink any alcoholic beverages.  You may eat anything you prefer, but it is better to start with liquids then soups and crackers, and gradually  work up to solid foods.  Please notify your doctor immediately if you have any unusual bleeding, trouble breathing or pain that is not related to your normal pain.  Depending on the type of procedure that was done, some parts of your body may feel week and/or numb.  This usually clears up by tonight or the next day.  Walk with the use of an assistive device or accompanied by an adult for the 24 hours.  You may use ice on the affected area for the first 24 hours.  Put ice in a Ziploc bag and cover with a towel and place against area 15 minutes on 15 minutes off.  You may switch to heat after 24 hours.GENERAL RISKS AND COMPLICATIONS  What are the risk, side effects and possible complications? Generally speaking, most procedures are safe.  However, with any procedure there are risks, side effects, and the possibility of complications.  The risks and complications are dependent upon the sites that are lesioned, or the type of nerve block to be performed.  The closer the procedure is to the spine, the more serious the risks are.  Great care is taken when placing the radio frequency needles, block needles or lesioning probes, but sometimes complications can occur. 1. Infection: Any time there is an injection through the skin, there is a risk of infection.  This is why sterile conditions are used for these blocks.  There are four possible types of infection. 1. Localized skin infection. 2. Central Nervous System Infection-This can be in the form of Meningitis, which can be deadly. 3. Epidural Infections-This can be in the form of an epidural abscess, which can cause pressure inside of the spine, causing compression of the spinal cord with subsequent paralysis. This would require an emergency surgery to decompress, and there are no guarantees that the patient would recover from the paralysis. 4. Discitis-This is an infection of the intervertebral discs.  It occurs in about 1% of discography procedures.  It  is difficult to treat and it may lead to surgery.        2. Pain: the needles have to go through skin and soft tissues, will cause soreness.       3. Damage to internal structures:  The nerves to be lesioned may be near blood vessels or    other nerves which can be potentially damaged.       4. Bleeding: Bleeding is more common if the patient is taking blood thinners such as  aspirin, Coumadin, Ticiid, Plavix, etc., or if he/she have some genetic predisposition  such as hemophilia. Bleeding into the spinal canal can cause compression of the spinal  cord with subsequent paralysis.  This would require an emergency surgery to  decompress and there are no guarantees that the patient would recover from the  paralysis.       5. Pneumothorax:  Puncturing of a lung is a possibility, every time a needle is introduced in  the area of the chest or upper back.  Pneumothorax refers to free air around the  collapsed lung(s), inside of the thoracic cavity (chest cavity).  Another two possible  complications related to a similar event would include: Hemothorax and Chylothorax.   These are  variations of the Pneumothorax, where instead of air around the collapsed  lung(s), you may have blood or chyle, respectively.       6. Spinal headaches: They may occur with any procedures in the area of the spine.       7. Persistent CSF (Cerebro-Spinal Fluid) leakage: This is a rare problem, but may occur  with prolonged intrathecal or epidural catheters either due to the formation of a fistulous  track or a dural tear.       8. Nerve damage: By working so close to the spinal cord, there is always a possibility of  nerve damage, which could be as serious as a permanent spinal cord injury with  paralysis.       9. Death:  Although rare, severe deadly allergic reactions known as "Anaphylactic  reaction" can occur to any of the medications used.      10. Worsening of the symptoms:  We can always make thing worse.  What are the chances  of something like this happening? Chances of any of this occuring are extremely low.  By statistics, you have more of a chance of getting killed in a motor vehicle accident: while driving to the hospital than any of the above occurring .  Nevertheless, you should be aware that they are possibilities.  In general, it is similar to taking a shower.  Everybody knows that you can slip, hit your head and get killed.  Does that mean that you should not shower again?  Nevertheless always keep in mind that statistics do not mean anything if you happen to be on the wrong side of them.  Even if a procedure has a 1 (one) in a 1,000,000 (million) chance of going wrong, it you happen to be that one..Also, keep in mind that by statistics, you have more of a chance of having something go wrong when taking medications.  Who should not have this procedure? If you are on a blood thinning medication (e.g. Coumadin, Plavix, see list of "Blood Thinners"), or if you have an active infection going on, you should not have the procedure.  If you are taking any blood thinners, please inform your physician.  How should I prepare for this procedure?  Do not eat or drink anything at least six hours prior to the procedure.  Bring a driver with you .  It cannot be a taxi.  Come accompanied by an adult that can drive you back, and that is strong enough to help you if your legs get weak or numb from the local anesthetic.  Take all of your medicines the morning of the procedure with just enough water to swallow them.  If you have diabetes, make sure that you are scheduled to have your procedure done first thing in the morning, whenever possible.  If you have diabetes, take only half of your insulin dose and notify our nurse that you have done so as soon as you arrive at the clinic.  If you are diabetic, but only take blood sugar pills (oral hypoglycemic), then do not take them on the morning of your procedure.  You may take them  after you have had the procedure.  Do not take aspirin or any aspirin-containing medications, at least eleven (11) days prior to the procedure.  They may prolong bleeding.  Wear loose fitting clothing that may be easy to take off and that you would not mind if it got stained with Betadine or blood.  Do not  wear any jewelry or perfume  Remove any nail coloring.  It will interfere with some of our monitoring equipment.  NOTE: Remember that this is not meant to be interpreted as a complete list of all possible complications.  Unforeseen problems may occur.  BLOOD THINNERS The following drugs contain aspirin or other products, which can cause increased bleeding during surgery and should not be taken for 2 weeks prior to and 1 week after surgery.  If you should need take something for relief of minor pain, you may take acetaminophen which is found in Tylenol,m Datril, Anacin-3 and Panadol. It is not blood thinner. The products listed below are.  Do not take any of the products listed below in addition to any listed on your instruction sheet.  A.P.C or A.P.C with Codeine Codeine Phosphate Capsules #3 Ibuprofen Ridaura  ABC compound Congesprin Imuran rimadil  Advil Cope Indocin Robaxisal  Alka-Seltzer Effervescent Pain Reliever and Antacid Coricidin or Coricidin-D  Indomethacin Rufen  Alka-Seltzer plus Cold Medicine Cosprin Ketoprofen S-A-C Tablets  Anacin Analgesic Tablets or Capsules Coumadin Korlgesic Salflex  Anacin Extra Strength Analgesic tablets or capsules CP-2 Tablets Lanoril Salicylate  Anaprox Cuprimine Capsules Levenox Salocol  Anexsia-D Dalteparin Magan Salsalate  Anodynos Darvon compound Magnesium Salicylate Sine-off  Ansaid Dasin Capsules Magsal Sodium Salicylate  Anturane Depen Capsules Marnal Soma  APF Arthritis pain formula Dewitt's Pills Measurin Stanback  Argesic Dia-Gesic Meclofenamic Sulfinpyrazone  Arthritis Bayer Timed Release Aspirin Diclofenac Meclomen Sulindac   Arthritis pain formula Anacin Dicumarol Medipren Supac  Analgesic (Safety coated) Arthralgen Diffunasal Mefanamic Suprofen  Arthritis Strength Bufferin Dihydrocodeine Mepro Compound Suprol  Arthropan liquid Dopirydamole Methcarbomol with Aspirin Synalgos  ASA tablets/Enseals Disalcid Micrainin Tagament  Ascriptin Doan's Midol Talwin  Ascriptin A/D Dolene Mobidin Tanderil  Ascriptin Extra Strength Dolobid Moblgesic Ticlid  Ascriptin with Codeine Doloprin or Doloprin with Codeine Momentum Tolectin  Asperbuf Duoprin Mono-gesic Trendar  Aspergum Duradyne Motrin or Motrin IB Triminicin  Aspirin plain, buffered or enteric coated Durasal Myochrisine Trigesic  Aspirin Suppositories Easprin Nalfon Trillsate  Aspirin with Codeine Ecotrin Regular or Extra Strength Naprosyn Uracel  Atromid-S Efficin Naproxen Ursinus  Auranofin Capsules Elmiron Neocylate Vanquish  Axotal Emagrin Norgesic Verin  Azathioprine Empirin or Empirin with Codeine Normiflo Vitamin E  Azolid Emprazil Nuprin Voltaren  Bayer Aspirin plain, buffered or children's or timed BC Tablets or powders Encaprin Orgaran Warfarin Sodium  Buff-a-Comp Enoxaparin Orudis Zorpin  Buff-a-Comp with Codeine Equegesic Os-Cal-Gesic   Buffaprin Excedrin plain, buffered or Extra Strength Oxalid   Bufferin Arthritis Strength Feldene Oxphenbutazone   Bufferin plain or Extra Strength Feldene Capsules Oxycodone with Aspirin   Bufferin with Codeine Fenoprofen Fenoprofen Pabalate or Pabalate-SF   Buffets II Flogesic Panagesic   Buffinol plain or Extra Strength Florinal or Florinal with Codeine Panwarfarin   Buf-Tabs Flurbiprofen Penicillamine   Butalbital Compound Four-way cold tablets Penicillin   Butazolidin Fragmin Pepto-Bismol   Carbenicillin Geminisyn Percodan   Carna Arthritis Reliever Geopen Persantine   Carprofen Gold's salt Persistin   Chloramphenicol Goody's Phenylbutazone   Chloromycetin Haltrain Piroxlcam   Clmetidine heparin Plaquenil    Cllnoril Hyco-pap Ponstel   Clofibrate Hydroxy chloroquine Propoxyphen         Before stopping any of these medications, be sure to consult the physician who ordered them.  Some, such as Coumadin (Warfarin) are ordered to prevent or treat serious conditions such as "deep thrombosis", "pumonary embolisms", and other heart problems.  The amount of time that you may need off of the medication may  also vary with the medication and the reason for which you were taking it.  If you are taking any of these medications, please make sure you notify your pain physician before you undergo any procedures.

## 2016-07-12 NOTE — Progress Notes (Signed)
[name] Name: Audrey Garrett  MRN: [id number]  Referring Provider: [name], MD  DOB: [birth year]  PCP: Jodi Marble, MD  DOS: 07/12/2016  Note by: Kathlen Brunswick. Dossie Arbour, MD  Service setting: Ambulatory outpatient  Location: [facility] (AMB) Pain Management Facility  Visit type: Procedure  Specialty: Interventional Pain Management  Patient type: Established   Primary Reason for Visit: Interventional Pain Management Treatment. CC: No chief complaint on file.  Procedure:  Anesthesia, Analgesia, Anxiolysis:  Type: Therapeutic Intra-Articular Hip Injection Region:  Posterolateral hip joint area. Level: Lower pelvic and hip joint level. Laterality: Bilateral  Type: Local Anesthesia with Moderate (Conscious) Sedation Local Anesthetic: Lidocaine 1% Route: Intravenous (IV) IV Access: Secured Sedation: Meaningful verbal contact was maintained at all times during the procedure  Indication(s): Analgesia and Anxiety  Indications: 1. Osteoarthritis of hip (Bilateral)   2. Hip pain, chronic, unspecified laterality    Pain Score: Pre-procedure: 4 /10 Post-procedure: 0-No pain/10  Pre-op Assessment:  Previous date of service: 07/07/16 Service provided: Evaluation Audrey Garrett is a 56 y.o. (year old), female patient, seen today for interventional treatment. She  has a past surgical history that includes Tubal ligation; Tonsillectomy; Back surgery (12/19/2011); Esophagogastroduodenoscopy (N/A, 04/14/2015); Esophagogastroduodenoscopy (egd) with propofol (N/A, 09/16/2015); Colonoscopy with propofol (N/A, 09/25/2015); Umbilical hernia repair (N/A, 11/06/2015); Hernia repair (N/A, 10/2015); Esophagogastroduodenoscopy (egd) with propofol (N/A, 01/22/2016); Back surgery; Colonoscopy with propofol (N/A, 04/01/2016); and Esophagogastroduodenoscopy (egd) with propofol (N/A, 04/01/2016). Her primarily concern today is the No chief complaint on file.  Initial Vital Signs: Blood pressure (!) 104/58, pulse 77, temperature  98.1 F (36.7 C), resp. rate 18, height '5\' 10"'$  (1.778 m), weight 147 lb (66.7 kg), SpO2 99 %. BMI: 21.09 kg/m  Risk Assessment: Allergies: Reviewed. She has No Known Allergies.  Allergy Precautions: None required Coagulopathies: "Reviewed. None identified.  Blood-thinner therapy: None at this time Active Infection(s): Reviewed. None identified. Audrey Garrett is afebrile  Site Confirmation: Audrey Garrett was asked to confirm the procedure and laterality before marking the site Procedure checklist: Completed Consent: Before the procedure and under the influence of no sedative(s), amnesic(s), or anxiolytics, the patient was informed of the treatment options, risks and possible complications. To fulfill our ethical and legal obligations, as recommended by the American Medical Association's Code of Ethics, I have informed the patient of my clinical impression; the nature and purpose of the treatment or procedure; the risks, benefits, and possible complications of the intervention; the alternatives, including doing nothing; the risk(s) and benefit(s) of the alternative treatment(s) or procedure(s); and the risk(s) and benefit(s) of doing nothing. The patient was provided information about the general risks and possible complications associated with the procedure. These may include, but are not limited to: failure to achieve desired goals, infection, bleeding, organ or nerve damage, allergic reactions, paralysis, and death. In addition, the patient was informed of those risks and complications associated to the procedure, such as failure to decrease pain; infection; bleeding; organ or nerve damage with subsequent damage to sensory, motor, and/or autonomic systems, resulting in permanent pain, numbness, and/or weakness of one or several areas of the body; allergic reactions; (i.e.: anaphylactic reaction); and/or death. Furthermore, the patient was informed of those risks and complications associated with the  medications. These include, but are not limited to: allergic reactions (i.e.: anaphylactic or anaphylactoid reaction(s)); adrenal axis suppression; blood sugar elevation that in diabetics may result in ketoacidosis or comma; water retention that in patients with history of congestive heart failure may result in shortness of breath,  pulmonary edema, and decompensation with resultant heart failure; weight gain; swelling or edema; medication-induced neural toxicity; particulate matter embolism and blood vessel occlusion with resultant organ, and/or nervous system infarction; and/or aseptic necrosis of one or more joints. Finally, the patient was informed that Medicine is not an exact science; therefore, there is also the possibility of unforeseen or unpredictable risks and/or possible complications that may result in a catastrophic outcome. The patient indicated having understood very clearly. We have given the patient no guarantees and we have made no promises. Enough time was given to the patient to ask questions, all of which were answered to the patient's satisfaction. Audrey Garrett has indicated that she wanted to continue with the procedure. Attestation: I, the ordering provider, attest that I have discussed with the patient the benefits, risks, side-effects, alternatives, likelihood of achieving goals, and potential problems during recovery for the procedure that I have provided informed consent. Date: 07/12/2016; Time: 10:09 AM  Pre-Procedure Preparation:  Monitoring: As per clinic protocol. Respiration, ETCO2, SpO2, BP, heart rate and rhythm monitor placed and checked for adequate function Safety Precautions: Patient was assessed for positional comfort and pressure points before starting the procedure. Time-out: I initiated and conducted the "Time-out" before starting the procedure, as per protocol. The patient was asked to participate by confirming the accuracy of the "Time Out" information. Verification  of the correct person, site, and procedure were performed and confirmed by me, the nursing staff, and the patient. "Time-out" conducted as per Joint Commission's Universal Protocol (UP.01.01.01). "Time-out" Date & Time: 07/12/2016; 1037 hrs.  Description of Procedure Process:   Position: Lateral Decubitus with bad side up Target Area: Superior aspect of the hip joint cavity, going thru the superior portion of the capsular ligament. Approach: Posterolateral approach. Area Prepped: Entire Posterolateral hip area. Prepping solution: ChloraPrep (2% chlorhexidine gluconate and 70% isopropyl alcohol) Safety Precautions: Aspiration looking for blood return was conducted prior to all injections. At no point did we inject any substances, as a needle was being advanced. No attempts were made at seeking any paresthesias. Safe injection practices and needle disposal techniques used. Medications properly checked for expiration dates. SDV (single dose vial) medications used. Description of the Procedure: Protocol guidelines were followed. The patient was placed in position over the fluoroscopy table. The target area was identified and the area prepped in the usual manner. Skin & deeper tissues infiltrated with local anesthetic. Appropriate amount of time allowed to pass for local anesthetics to take effect. The procedure needles were then advanced to the target area. Proper needle placement secured. Negative aspiration confirmed. Solution injected in intermittent fashion, asking for systemic symptoms every 0.5cc of injectate. The needles were then removed and the area cleansed, making sure to leave some of the prepping solution back to take advantage of its long term bactericidal properties. Vitals:   07/12/16 1051 07/12/16 1101 07/12/16 1111 07/12/16 1121  BP: 101/71 (!) 107/53 (!) 107/53 (!) 104/51  Pulse:      Resp: '16 16 16 16  '$ Temp:      SpO2: 95% 96% 97% 97%  Weight:      Height:        Start Time: 1037  hrs. End Time: 1049 hrs. Materials:  Needle(s) Type: Regular needle Gauge: 22G Length: 3.5-in Medication(s): We administered fentaNYL, lactated ringers, midazolam, methylPREDNISolone acetate, ropivacaine (PF) 2 mg/mL (0.2%), and iopamidol. Please see chart orders for dosing details.  Imaging Guidance (Non-Spinal):  Type of Imaging Technique: Fluoroscopy Guidance (Non-Spinal) Indication(s): Assistance in  needle guidance and placement for procedures requiring needle placement in or near specific anatomical locations not easily accessible without such assistance. Exposure Time: Please see nurses notes. Contrast: Before injecting any contrast, we confirmed that the patient did not have an allergy to iodine, shellfish, or radiological contrast. Once satisfactory needle placement was completed at the desired level, radiological contrast was injected. Contrast injected under live fluoroscopy. No contrast complications. See chart for type and volume of contrast used. Fluoroscopic Guidance: I was personally present during the use of fluoroscopy. "Tunnel Vision Technique" used to obtain the best possible view of the target area. Parallax error corrected before commencing the procedure. "Direction-depth-direction" technique used to introduce the needle under continuous pulsed fluoroscopy. Once target was reached, antero-posterior, oblique, and lateral fluoroscopic projection used confirm needle placement in all planes. Images permanently stored in EMR. Interpretation: I personally interpreted the imaging intraoperatively. Adequate needle placement confirmed in multiple planes. Appropriate spread of contrast into desired area was observed. No evidence of afferent or efferent intravascular uptake. Permanent images saved into the patient's record.  Antibiotic Prophylaxis:  Indication(s): None identified Antibiotic given: None  Post-operative Assessment:  EBL: None Complications: No immediate post-treatment  complications observed by team, or reported by patient. Note: The patient tolerated the entire procedure well. A repeat set of vitals were taken after the procedure and the patient was kept under observation following institutional policy, for this type of procedure. Post-procedural neurological assessment was performed, showing return to baseline, prior to discharge. The patient was provided with post-procedure discharge instructions, including a section on how to identify potential problems. Should any problems arise concerning this procedure, the patient was given instructions to immediately contact us, at any time, without hesitation. In any case, we plan to contact the patient by telephone for a follow-up status report regarding this interventional procedure. Comments:  No additional relevant information.  Plan of Care  Disposition: Discharge home  Discharge Date & Time: 07/12/2016; 1123 hrs.  Physician-requested Follow-up:  Return in about 2 weeks (around 07/26/2016) for Post-Procedure evaluation.  Future Appointments Date Time Provider [place]  07/15/2016 11:00 AM [facility] 5 [facility] None  07/29/2016 9:00 AM [facility] None  07/29/2016 9:15 AM Sindy Guadeloupe, MD [facility] None  08/04/2016 1:15 PM [name], MD [facility] None  08/12/2016 1:30 PM [facility] None  08/12/2016 1:45 PM Sindy Guadeloupe, MD [facility] None  08/19/2016 10:00 AM [name], MD [facility] None  09/08/2016 10:45 AM Hollice Espy, MD [facility] None   Medications ordered for procedure: Meds ordered this encounter  Medications  . fentaNYL (SUBLIMAZE) injection 25-50 mcg    Make sure Narcan is available in the pyxis when using this medication. In the event of respiratory depression (RR< 8/min): Titrate NARCAN (naloxone) in increments of 0.1 to 0.2 mg IV at 2-3 minute intervals, until desired degree of reversal.  . lactated ringers infusion 1,000 mL  .  midazolam (VERSED) 5 MG/5ML injection 1-2 mg    Make sure Flumazenil is available in the pyxis when using this medication. If oversedation occurs, administer 0.2 mg IV over 15 sec. If after 45 sec no response, administer 0.2 mg again over 1 min; may repeat at 1 min intervals; not to exceed 4 doses (1 mg)  . methylPREDNISolone acetate (DEPO-MEDROL) injection 80 mg  . lidocaine (PF) (XYLOCAINE) 1 % injection 10 mL  . ropivacaine (PF) 2 mg/mL (0.2%) (NAROPIN) injection 9 mL  . iopamidol (ISOVUE-M) 41 % intrathecal injection 10  mL   Medications administered: We administered fentaNYL, lactated ringers, midazolam, methylPREDNISolone acetate, ropivacaine (PF) 2 mg/mL (0.2%), and iopamidol.  See the medical record for exact dosing, route, and time of administration.  Lab-work, Procedure(s), & Referral(s) Ordered: Orders Placed This Encounter  Procedures  . DG C-Arm 1-60 Min-No Report  . Discharge instructions  . Follow-up  . Informed Consent Details: Transcribe to consent form and obtain patient signature  . Provider attestation of informed consent for procedure/surgical case  . Verify informed consent   Imaging Ordered: Results for orders placed in visit on 06/09/16  DG C-Arm 1-60 Min-No Report   Narrative Fluoroscopy was utilized by the requesting physician.  No radiographic  interpretation.    New Prescriptions   No medications on file   Primary Care Physician: Jodi Marble, MD Location: [facility] Note by: Kathlen Brunswick. Dossie Arbour, M.D, DABA, DABAPM, DABPM, DABIPP, FIPP Date: 07/12/2016; Time: 4:56 PM  Disclaimer:  Medicine is not an exact science. The only guarantee in medicine is that nothing is guaranteed. It is important to note that the decision to proceed with this intervention was based on the information collected from the patient. The Data and conclusions were drawn from the patient's questionnaire, the interview, and the physical examination.  Because the information was provided in large part by the patient, it cannot be guaranteed that it has not been purposely or unconsciously manipulated. Every effort has been made to obtain as much relevant data as possible for this evaluation. It is important to note that the conclusions that lead to this procedure are derived in large part from the available data. Always take into account that the treatment will also be dependent on availability of resources and existing treatment guidelines, considered by other Pain Management Practitioners as being common knowledge and practice, at the time of the intervention. For Medico-Legal purposes, it is also important to point out that variation in procedural techniques and pharmacological choices are the acceptable norm. The indications, contraindications, technique, and results of the above procedure should only be interpreted and judged by a Board-Certified Interventional Pain Specialist with extensive familiarity and expertise in the same exact procedure and technique. Attempts at providing opinions without similar or greater experience and expertise than that of the treating physician will be considered as inappropriate and unethical, and shall result in a formal complaint to the state medical board and applicable specialty societies.  Instructions provided at this appointment: Patient Instructions   Post-Procedure instructions Instructions:  Apply ice: Fill a plastic sandwich bag with crushed ice. Cover it with a small towel and apply to injection site. Apply for 15 minutes then remove x 15 minutes. Repeat sequence on day of procedure, until you go to bed. The purpose is to minimize swelling and discomfort after procedure.  Apply heat: Apply heat to procedure site starting the day following the procedure. The purpose is to treat any soreness and discomfort from the procedure.  Food intake: Start with clear liquids (like water) and advance to regular food,  as tolerated.   Physical activities: Keep activities to a minimum for the first 8 hours after the procedure.   Driving: If you have received any sedation, you are not allowed to drive for 24 hours after your procedure.  Blood thinner: Restart your blood thinner 6 hours after your procedure. (Only for those taking blood thinners)  Insulin: As soon as you can eat, you may resume your normal dosing schedule. (Only for those taking insulin)  Infection prevention: Keep procedure site clean and dry.  Post-procedure Pain Diary: Extremely important that this be done correctly and accurately. Recorded information will be used to determine the next step in treatment.  Pain evaluated is that of treated area only. Do not include pain from an untreated area.  Complete every hour, on the hour, for the initial 8 hours. Set an alarm to help you do this part accurately.  Do not go to sleep and have it completed later. It will not be accurate.  Follow-up appointment: Keep your follow-up appointment after the procedure. Usually 2 weeks for most procedures. (6 weeks in the case of radiofrequency.) Bring you pain diary.  Expect:  From numbing medicine (AKA: Local Anesthetics): Numbness or decrease in pain.  Onset: Full effect within 15 minutes of injected.  Duration: It will depend on the type of local anesthetic used. On the average, 1 to 8 hours.   From steroids: Decrease in swelling or inflammation. Once inflammation is improved, relief of the pain will follow.  Onset of benefits: Depends on the amount of swelling present. The more swelling, the longer it will take for the benefits to be seen.   Duration: Steroids will stay in the system x 2 weeks. Duration of benefits will depend on multiple posibilities including persistent irritating factors.  From procedure: Some discomfort is to be expected once the numbing medicine wears off. This should be minimal if ice and heat are applied as  instructed. Call if:  You experience numbness and weakness that gets worse with time, as opposed to wearing off.  New onset bowel or bladder incontinence. (Spinal procedures only)  Emergency Numbers:  Durning business hours (Monday - Thursday, 8:00 AM - 4:00 PM) (Friday, 9:00 AM - 12:00 Noon): [phone]  After hours: [phone]   __________________________________________________________________________________________   Pain Management Discharge Instructions  General Discharge Instructions :  If you need to reach your doctor call: Monday-Friday 8:00 am - 4:00 pm at [phone] or toll free [phone].  After clinic hours [phone] to have operator reach doctor.  Bring all of your medication bottles to all your appointments in the pain clinic.  To cancel or reschedule your appointment with Pain Management please remember to call 24 hours in advance to avoid a fee.  Refer to the educational materials which you have been given on: General Risks, I had my Procedure. Discharge Instructions, Post Sedation.  Post Procedure Instructions:  The drugs you were given will stay in your system until tomorrow, so for the next 24 hours you should not drive, make any legal decisions or drink any alcoholic beverages.  You may eat anything you prefer, but it is better to start with liquids then soups and crackers, and gradually work up to solid foods.  Please notify your doctor immediately if you have any unusual bleeding, trouble breathing or pain that is not related to your normal pain.  Depending on the type of procedure that was done, some parts of your body may feel week and/or numb.  This usually clears up by tonight or the next day.  Walk with the use of an assistive device or accompanied by an adult for the 24 hours.  You may use ice on the affected area for the first 24 hours.  Put ice in a Ziploc bag and cover with a towel and place against area 15 minutes on 15  minutes off.  You may switch to heat after 24 hours.GENERAL RISKS AND COMPLICATIONS  What are  the risk, side effects and possible complications? Generally speaking, most procedures are safe.  However, with any procedure there are risks, side effects, and the possibility of complications.  The risks and complications are dependent upon the sites that are lesioned, or the type of nerve block to be performed.  The closer the procedure is to the spine, the more serious the risks are.  Great care is taken when placing the radio frequency needles, block needles or lesioning probes, but sometimes complications can occur. 1. Infection: Any time there is an injection through the skin, there is a risk of infection.  This is why sterile conditions are used for these blocks.  There are four possible types of infection. 1. Localized skin infection. 2. Central Nervous System Infection-This can be in the form of Meningitis, which can be deadly. 3. Epidural Infections-This can be in the form of an epidural abscess, which can cause pressure inside of the spine, causing compression of the spinal cord with subsequent paralysis. This would require an emergency surgery to decompress, and there are no guarantees that the patient would recover from the paralysis. 4. Discitis-This is an infection of the intervertebral discs.  It occurs in about 1% of discography procedures.  It is difficult to treat and it may lead to surgery.        2. Pain: the needles have to go through skin and soft tissues, will cause soreness.       3. Damage to internal structures:  The nerves to be lesioned may be near blood vessels or    other nerves which can be potentially damaged.       4. Bleeding: Bleeding is more common if the patient is taking blood thinners such as  aspirin, Coumadin, Ticiid, Plavix, etc., or if he/she have some genetic predisposition  such as hemophilia. Bleeding into the spinal canal can cause compression of the spinal  cord  with subsequent paralysis.  This would require an emergency surgery to  decompress and there are no guarantees that the patient would recover from the  paralysis.       5. Pneumothorax:  Puncturing of a lung is a possibility, every time a needle is introduced in  the area of the chest or upper back.  Pneumothorax refers to free air around the  collapsed lung(s), inside of the thoracic cavity (chest cavity).  Another two possible  complications related to a similar event would include: Hemothorax and Chylothorax.   These are variations of the Pneumothorax, where instead of air around the collapsed  lung(s), you may have blood or chyle, respectively.       6. Spinal headaches: They may occur with any procedures in the area of the spine.       7. Persistent CSF (Cerebro-Spinal Fluid) leakage: This is a rare problem, but may occur  with prolonged intrathecal or epidural catheters either due to the formation of a fistulous  track or a dural tear.       8. Nerve damage: By working so close to the spinal cord, there is always a possibility of  nerve damage, which could be as serious as a permanent spinal cord injury with  paralysis.       9. Death:  Although rare, severe deadly allergic reactions known as "Anaphylactic  reaction" can occur to any of the medications used.      10. Worsening of the symptoms:  We can always make thing worse.  What are the chances of something  like this happening? Chances of any of this occuring are extremely low.  By statistics, you have more of a chance of getting killed in a motor vehicle accident: while driving to the hospital than any of the above occurring .  Nevertheless, you should be aware that they are possibilities.  In general, it is similar to taking a shower.  Everybody knows that you can slip, hit your head and get killed.  Does that mean that you should not shower again?  Nevertheless always keep in mind that statistics do not mean anything if you happen to be on the  wrong side of them.  Even if a procedure has a 1 (one) in a 1,000,000 (million) chance of going wrong, it you happen to be that one..Also, keep in mind that by statistics, you have more of a chance of having something go wrong when taking medications.  Who should not have this procedure? If you are on a blood thinning medication (e.g. Coumadin, Plavix, see list of "Blood Thinners"), or if you have an active infection going on, you should not have the procedure.  If you are taking any blood thinners, please inform your physician.  How should I prepare for this procedure?  Do not eat or drink anything at least six hours prior to the procedure.  Bring a driver with you .  It cannot be a taxi.  Come accompanied by an adult that can drive you back, and that is strong enough to help you if your legs get weak or numb from the local anesthetic.  Take all of your medicines the morning of the procedure with just enough water to swallow them.  If you have diabetes, make sure that you are scheduled to have your procedure done first thing in the morning, whenever possible.  If you have diabetes, take only half of your insulin dose and notify our nurse that you have done so as soon as you arrive at the clinic.  If you are diabetic, but only take blood sugar pills (oral hypoglycemic), then do not take them on the morning of your procedure.  You may take them after you have had the procedure.  Do not take aspirin or any aspirin-containing medications, at least eleven (11) days prior to the procedure.  They may prolong bleeding.  Wear loose fitting clothing that may be easy to take off and that you would not mind if it got stained with Betadine or blood.  Do not wear any jewelry or perfume  Remove any nail coloring.  It will interfere with some of our monitoring equipment.  NOTE: Remember that this is not meant to be interpreted as a complete list of all possible complications.  Unforeseen problems may  occur.  BLOOD THINNERS The following drugs contain aspirin or other products, which can cause increased bleeding during surgery and should not be taken for 2 weeks prior to and 1 week after surgery.  If you should need take something for relief of minor pain, you may take acetaminophen which is found in Tylenol,m Datril, Anacin-3 and Panadol. It is not blood thinner. The products listed below are.  Do not take any of the products listed below in addition to any listed on your instruction sheet.  A.P.C or A.P.C with Codeine Codeine Phosphate Capsules #3 Ibuprofen Ridaura  ABC compound Congesprin Imuran rimadil  Advil Cope Indocin Robaxisal  Alka-Seltzer Effervescent Pain Reliever and Antacid Coricidin or Coricidin-D  Indomethacin Rufen  Alka-Seltzer plus Cold Medicine Cosprin Ketoprofen  S-A-C Tablets  Anacin Analgesic Tablets or Capsules Coumadin Korlgesic Salflex  Anacin Extra Strength Analgesic tablets or capsules CP-2 Tablets Lanoril Salicylate  Anaprox Cuprimine Capsules Levenox Salocol  Anexsia-D Dalteparin Magan Salsalate  Anodynos Darvon compound Magnesium Salicylate Sine-off  Ansaid Dasin Capsules Magsal Sodium Salicylate  Anturane Depen Capsules Marnal Soma  APF Arthritis pain formula Dewitt's Pills Measurin Stanback  Argesic Dia-Gesic Meclofenamic Sulfinpyrazone  Arthritis Bayer Timed Release Aspirin Diclofenac Meclomen Sulindac  Arthritis pain formula Anacin Dicumarol Medipren Supac  Analgesic (Safety coated) Arthralgen Diffunasal Mefanamic Suprofen  Arthritis Strength Bufferin Dihydrocodeine Mepro Compound Suprol  Arthropan liquid Dopirydamole Methcarbomol with Aspirin Synalgos  ASA tablets/Enseals Disalcid Micrainin Tagament  Ascriptin Doan's Midol Talwin  Ascriptin A/D Dolene Mobidin Tanderil  Ascriptin Extra Strength Dolobid Moblgesic Ticlid  Ascriptin with Codeine Doloprin or Doloprin with Codeine Momentum Tolectin  Asperbuf Duoprin Mono-gesic Trendar  Aspergum Duradyne  Motrin or Motrin IB Triminicin  Aspirin plain, buffered or enteric coated Durasal Myochrisine Trigesic  Aspirin Suppositories Easprin Nalfon Trillsate  Aspirin with Codeine Ecotrin Regular or Extra Strength Naprosyn Uracel  Atromid-S Efficin Naproxen Ursinus  Auranofin Capsules Elmiron Neocylate Vanquish  Axotal Emagrin Norgesic Verin  Azathioprine Empirin or Empirin with Codeine Normiflo Vitamin E  Azolid Emprazil Nuprin Voltaren  Bayer Aspirin plain, buffered or children's or timed BC Tablets or powders Encaprin Orgaran Warfarin Sodium  Buff-a-Comp Enoxaparin Orudis Zorpin  Buff-a-Comp with Codeine Equegesic Os-Cal-Gesic   Buffaprin Excedrin plain, buffered or Extra Strength Oxalid   Bufferin Arthritis Strength Feldene Oxphenbutazone   Bufferin plain or Extra Strength Feldene Capsules Oxycodone with Aspirin   Bufferin with Codeine Fenoprofen Fenoprofen Pabalate or Pabalate-SF   Buffets II Flogesic Panagesic   Buffinol plain or Extra Strength Florinal or Florinal with Codeine Panwarfarin   Buf-Tabs Flurbiprofen Penicillamine   Butalbital Compound Four-way cold tablets Penicillin   Butazolidin Fragmin Pepto-Bismol   Carbenicillin Geminisyn Percodan   Carna Arthritis Reliever Geopen Persantine   Carprofen Gold's salt Persistin   Chloramphenicol Goody's Phenylbutazone   Chloromycetin Haltrain Piroxlcam   Clmetidine heparin Plaquenil   Cllnoril Hyco-pap Ponstel   Clofibrate Hydroxy chloroquine Propoxyphen         Before stopping any of these medications, be sure to consult the physician who ordered them.  Some, such as Coumadin (Warfarin) are ordered to prevent or treat serious conditions such as "deep thrombosis", "pumonary embolisms", and other heart problems.  The amount of time that you may need off of the medication may also vary with the medication and the reason for which you were taking it.  If you are taking any of these medications, please make sure you notify your pain  physician before you undergo any procedures.

## 2016-07-13 ENCOUNTER — Telehealth: Payer: Self-pay | Admitting: *Deleted

## 2016-07-13 NOTE — Telephone Encounter (Signed)
Spoke with patient, verbalizes no questions or concerns re; procedure on yesterday.  

## 2016-07-15 ENCOUNTER — Ambulatory Visit: Payer: Medicare Other | Admitting: Oncology

## 2016-07-15 ENCOUNTER — Ambulatory Visit: Payer: Medicare Other

## 2016-07-15 ENCOUNTER — Inpatient Hospital Stay: Payer: Medicare Other

## 2016-07-16 ENCOUNTER — Ambulatory Visit
Admission: RE | Admit: 2016-07-16 | Discharge: 2016-07-16 | Disposition: A | Discharge status: Home / Self Care | Payer: Medicare Other | Source: Ambulatory Visit | Attending: Pain Medicine | Admitting: Pain Medicine

## 2016-07-16 ENCOUNTER — Inpatient Hospital Stay: Payer: Medicare Other

## 2016-07-16 DIAGNOSIS — M25552 Pain in left hip: Secondary | ICD-10-CM | POA: Diagnosis present

## 2016-07-16 DIAGNOSIS — M533 Sacrococcygeal disorders, not elsewhere classified: Secondary | ICD-10-CM | POA: Diagnosis not present

## 2016-07-16 DIAGNOSIS — G8929 Other chronic pain: Secondary | ICD-10-CM

## 2016-07-16 DIAGNOSIS — M25559 Pain in unspecified hip: Principal | ICD-10-CM

## 2016-07-16 DIAGNOSIS — D500 Iron deficiency anemia secondary to blood loss (chronic): Secondary | ICD-10-CM

## 2016-07-16 DIAGNOSIS — D509 Iron deficiency anemia, unspecified: Secondary | ICD-10-CM | POA: Diagnosis not present

## 2016-07-16 MED ORDER — SODIUM CHLORIDE 0.9 % IV SOLN
510.0000 mg | Freq: Once | INTRAVENOUS | Status: AC
  Administered 2016-07-16: 510 mg via INTRAVENOUS
  Filled 2016-07-16: qty 17

## 2016-07-16 MED ORDER — SODIUM CHLORIDE 0.9 % IV SOLN
Freq: Once | INTRAVENOUS | Status: AC
  Administered 2016-07-16: 14:00:00 via INTRAVENOUS
  Filled 2016-07-16: qty 1000

## 2016-07-28 ENCOUNTER — Other Ambulatory Visit: Payer: Self-pay | Admitting: Gastroenterology

## 2016-07-28 DIAGNOSIS — D509 Iron deficiency anemia, unspecified: Secondary | ICD-10-CM

## 2016-07-29 ENCOUNTER — Encounter: Payer: Self-pay | Admitting: Oncology

## 2016-07-29 ENCOUNTER — Inpatient Hospital Stay: Payer: Medicare Other | Attending: Oncology | Admitting: Oncology

## 2016-07-29 ENCOUNTER — Inpatient Hospital Stay: Payer: Medicare Other

## 2016-07-29 VITALS — BP 106/69 | HR 82 | Temp 98.4°F | Resp 16 | Wt 151.7 lb

## 2016-07-29 DIAGNOSIS — B182 Chronic viral hepatitis C: Secondary | ICD-10-CM | POA: Diagnosis not present

## 2016-07-29 DIAGNOSIS — M5116 Intervertebral disc disorders with radiculopathy, lumbar region: Secondary | ICD-10-CM | POA: Insufficient documentation

## 2016-07-29 DIAGNOSIS — Z79899 Other long term (current) drug therapy: Secondary | ICD-10-CM | POA: Diagnosis not present

## 2016-07-29 DIAGNOSIS — G2581 Restless legs syndrome: Secondary | ICD-10-CM | POA: Insufficient documentation

## 2016-07-29 DIAGNOSIS — I10 Essential (primary) hypertension: Secondary | ICD-10-CM | POA: Insufficient documentation

## 2016-07-29 DIAGNOSIS — D500 Iron deficiency anemia secondary to blood loss (chronic): Secondary | ICD-10-CM

## 2016-07-29 DIAGNOSIS — K5730 Diverticulosis of large intestine without perforation or abscess without bleeding: Secondary | ICD-10-CM | POA: Diagnosis not present

## 2016-07-29 DIAGNOSIS — F329 Major depressive disorder, single episode, unspecified: Secondary | ICD-10-CM | POA: Diagnosis not present

## 2016-07-29 DIAGNOSIS — K7460 Unspecified cirrhosis of liver: Secondary | ICD-10-CM | POA: Insufficient documentation

## 2016-07-29 DIAGNOSIS — R911 Solitary pulmonary nodule: Secondary | ICD-10-CM | POA: Insufficient documentation

## 2016-07-29 DIAGNOSIS — E119 Type 2 diabetes mellitus without complications: Secondary | ICD-10-CM | POA: Insufficient documentation

## 2016-07-29 DIAGNOSIS — M797 Fibromyalgia: Secondary | ICD-10-CM | POA: Diagnosis not present

## 2016-07-29 DIAGNOSIS — F17210 Nicotine dependence, cigarettes, uncomplicated: Secondary | ICD-10-CM | POA: Diagnosis not present

## 2016-07-29 DIAGNOSIS — E559 Vitamin D deficiency, unspecified: Secondary | ICD-10-CM | POA: Insufficient documentation

## 2016-07-29 DIAGNOSIS — K259 Gastric ulcer, unspecified as acute or chronic, without hemorrhage or perforation: Secondary | ICD-10-CM | POA: Diagnosis not present

## 2016-07-29 DIAGNOSIS — K219 Gastro-esophageal reflux disease without esophagitis: Secondary | ICD-10-CM | POA: Insufficient documentation

## 2016-07-29 DIAGNOSIS — N289 Disorder of kidney and ureter, unspecified: Secondary | ICD-10-CM | POA: Diagnosis not present

## 2016-07-29 DIAGNOSIS — F419 Anxiety disorder, unspecified: Secondary | ICD-10-CM | POA: Diagnosis not present

## 2016-07-29 LAB — CBC
HCT: 36.7 % (ref 35.0–47.0)
Hemoglobin: 12.6 g/dL (ref 12.0–16.0)
MCH: 31.1 pg (ref 26.0–34.0)
MCHC: 34.5 g/dL (ref 32.0–36.0)
MCV: 90.3 fL (ref 80.0–100.0)
Platelets: 147 10*3/uL — ABNORMAL LOW (ref 150–440)
RBC: 4.06 MIL/uL (ref 3.80–5.20)
RDW: 24.9 % — ABNORMAL HIGH (ref 11.5–14.5)
WBC: 7.6 10*3/uL (ref 3.6–11.0)

## 2016-07-29 LAB — IRON AND TIBC
Iron: 101 ug/dL (ref 28–170)
Saturation Ratios: 23 % (ref 10.4–31.8)
TIBC: 431 ug/dL (ref 250–450)
UIBC: 330 ug/dL

## 2016-07-29 LAB — FERRITIN: Ferritin: 132 ng/mL (ref 11–307)

## 2016-07-29 NOTE — Progress Notes (Signed)
Hematology/Oncology Consult note [facility]  Telephone:([phone] [phone]  Patient Care Team: Jodi Marble, MD as PCP - General (Internal Medicine)   Name of the patient: Audrey Garrett  [id number]  1961/01/24   Date of visit: 07/29/16  Diagnosis- iron deficiency anemia of unclear etiology possibly diverticular bleed   Chief complaint/ Reason for visit- routine f/u  Heme/Onc history: Patient is a 56 year old female who was last seen by Dr. Alvy Bimler in jan 2018 for [place]. Patient has liver cirrhosis and is currently receiving Harvoni for hepatitis C. GI evaluation is as follows:  Upper EGD on 04/01/16 showed multiple dispersed diminutive erosions were found in the gastric antrum. There were no stigmata of recent bleeding. One healing cratered gastric ulcer with no stigmata of bleeding was found in the prepyloric region of the stomach. The lesion was 2 mm in largest dimension. Colonoscopy on 04/01/16 showed multiple small-mouthed diverticula were found in the sigmoid colon and descending colon. A single small localized angioectasia with typical arborization was found in the proximal ascending colon. Coagulation for tissue destruction using argon plasma at 0.5 liters/minute and 20 watts was successful.  She has received significant blood transfusions recently since March 2017. Her last blood transfusion was on 03/17/16. She had 2 units of blood She received 4 doses of IV Venofer  In Jan 2018 she received blood trasnsfusion followed by ferraheme.   During her visit in February 2018 her H&H was 10.3/ 31.6. IV iron was held off and she was supposed to get cbc checked in1 month. Patient missed that appointment and became progressively iron deficient again when her hb dropped to 7.9. She received 1 unit of blood transfusion  Interval history- continues to feel fatigued. Has intermittent diverticular bleed which resolves after a few days   Review of  systems- Review of Systems  Constitutional: Positive for malaise/fatigue. Negative for chills, fever and weight loss.  HENT: Negative for congestion, ear discharge and nosebleeds.   Eyes: Negative for blurred vision.  Respiratory: Negative for cough, hemoptysis, sputum production, shortness of breath and wheezing.   Cardiovascular: Negative for chest pain, palpitations, orthopnea and claudication.  Gastrointestinal: Negative for abdominal pain, blood in stool, constipation, diarrhea, heartburn, melena, nausea and vomiting.  Genitourinary: Negative for dysuria, flank pain, frequency, hematuria and urgency.  Musculoskeletal: Negative for back pain, joint pain and myalgias.  Skin: Negative for rash.  Neurological: Negative for dizziness, tingling, focal weakness, seizures, weakness and headaches.  Endo/Heme/Allergies: Does not bruise/bleed easily.  Psychiatric/Behavioral: Negative for depression and suicidal ideas. The patient does not have insomnia.      Current treatment- IV iron  No Known Allergies   Past Medical History:  Diagnosis Date  . Alcohol abuse   . Alcoholic cirrhosis of liver without ascites ([place])   . Anemia   . Anxiety   . Arthropathy   . Back pain   . Bronchitis   . BRONCHITIS, ACUTE 11/01/2009   Qualifier: Diagnosis of  By: Alveta Heimlich MD, Cornelia Copa    . Chronic hepatitis C ([place])   . Chronic LBP 12/25/2014   Overview:  Post extensive surgery   . Cirrhosis ([place])   . DDD (degenerative disc disease), lumbar 01/01/2015  . Depression   . Diabetes mellitus without complication ([place])   . Diverticulosis   . Edema   . Fatigue   . Fibromyalgia   . GERD (gastroesophageal reflux disease)    gastroparesis  . High risk medications (not anticoagulants) long-term  use   . Hyperglycemia   . Hypertension   . Kidney mass    11/17 small mass-  . Left leg pain 01/01/2015  . Lumbago   . Lumbar radicular pain 01/01/2015  . Muscle spasm   . Neck pain 01/01/2015  . Polyarthritis   . Reflux    . Restless leg syndrome    01/2016  . RLS (restless legs syndrome)   . Sacroiliac pain 01/01/2015  . Shoulder pain   . Sinusitis   . SOB (shortness of breath)   . Spine disorder   . Stress headaches   . Upper back pain 01/01/2015  . Urinary frequency   . Vitamin D deficiency disease      Past Surgical History:  Procedure Laterality Date  . BACK SURGERY  12/19/2011  . BACK SURGERY    . COLONOSCOPY WITH PROPOFOL N/A 09/25/2015   Procedure: COLONOSCOPY WITH PROPOFOL;  Surgeon: Lollie Sails, MD;  Location: [facility] ENDOSCOPY;  Service: Endoscopy;  Laterality: N/A;  . COLONOSCOPY WITH PROPOFOL N/A 04/01/2016   Procedure: COLONOSCOPY WITH PROPOFOL;  Surgeon: Lollie Sails, MD;  Location: [facility] ENDOSCOPY;  Service: Endoscopy;  Laterality: N/A;  . ESOPHAGOGASTRODUODENOSCOPY N/A 04/14/2015   Procedure: ESOPHAGOGASTRODUODENOSCOPY (EGD);  Surgeon: Lollie Sails, MD;  Location[facility] ENDOSCOPY;  Service: Endoscopy;  Laterality: N/A;  . ESOPHAGOGASTRODUODENOSCOPY (EGD) WITH PROPOFOL N/A 09/16/2015   Procedure: ESOPHAGOGASTRODUODENOSCOPY (EGD) WITH PROPOFOL;  Surgeon: Lollie Sails, MD;  Location: [facility] ENDOSCOPY;  Service: Endoscopy;  Laterality: N/A;  . ESOPHAGOGASTRODUODENOSCOPY (EGD) WITH PROPOFOL N/A 01/22/2016   Procedure: ESOPHAGOGASTRODUODENOSCOPY (EGD) WITH PROPOFOL;  Surgeon: [name], MD;  Location: [place];  Service: Endoscopy;  Laterality: N/A;  . ESOPHAGOGASTRODUODENOSCOPY (EGD) WITH PROPOFOL N/A 04/01/2016   Procedure: ESOPHAGOGASTRODUODENOSCOPY (EGD) WITH PROPOFOL;  Surgeon: Lollie Sails, MD;  Location: [facility] ENDOSCOPY;  Service: Endoscopy;  Laterality: N/A;  . HERNIA REPAIR N/A 10/2015   abdominal   . TONSILLECTOMY    . TUBAL LIGATION    . UMBILICAL HERNIA REPAIR N/A 11/06/2015   Procedure: HERNIA REPAIR UMBILICAL ADULT;  Surgeon: [name], MD;  Location: [facility] ORS;  Service: General;  Laterality: N/A;    Social History   Social History  .  Marital status: Married    Spouse name: N/A  . Number of children: N/A  . Years of education: N/A   Occupational History  . Not on file.   Social History Main Topics  . Smoking status: Current Every Day Smoker    Packs/day: 0.50    Years: 40.00    Types: Cigarettes  . Smokeless tobacco: Never Used  . Alcohol use No     Comment: stopped drinking 05/12/15  . Drug use: No  . Sexual activity: Not on file   Other Topics Concern  . Not on file   Social History Narrative   Lives at home with husband    Family History  Problem Relation Age of Onset  . Stroke Mother   . Heart disease Mother   . [name] Father   . Breast cancer Sister 68  . Kidney disease Neg Hx   . Bladder Cancer Neg Hx   . Kidney cancer Neg Hx   . Prostate cancer Neg Hx      Current Outpatient Prescriptions:  .  calcium carbonate (TUMS - DOSED IN MG ELEMENTAL CALCIUM) 500 MG chewable tablet, Chew 2 tablets by mouth 2 (two) times daily., Disp: , Rfl:  .  furosemide (LASIX) 40 MG tablet,  Take by mouth 2 (two) times daily. , Disp: , Rfl:  .  lactulose (CHRONULAC) 10 GM/15ML solution, Take 10 g by mouth 3 (three) times daily. , Disp: , Rfl:  .  Magnesium Oxide 250 MG TABS, Take 1 tablet by mouth daily., Disp: , Rfl:  .  Multiple Vitamin (MULTIVITAMIN WITH MINERALS) TABS tablet, Take 1 tablet by mouth daily., Disp: , Rfl:  .  omeprazole (PRILOSEC) 20 MG capsule, Take 20 mg by mouth daily., Disp: , Rfl:  .  Oxycodone HCl 20 MG TABS, Take 1 tablet (20 mg total) by mouth every 6 (six) hours as needed., Disp: 120 tablet, Rfl: 0 .  PARoxetine (PAXIL) 40 MG tablet, Take 40 mg by mouth every morning., Disp: , Rfl: 2 .  phytonadione (VITAMIN K) 5 MG tablet, Take 5 mg by mouth 3 (three) times a week. Mon, Wed, Fri, Disp: , Rfl:  .  pregabalin (LYRICA) 75 MG capsule, Take 75 mg by mouth 2 (two) times daily., Disp: , Rfl:  .  spironolactone (ALDACTONE) 50 MG tablet, Take 100 mg by mouth daily. , Disp: , Rfl: 0 .   sucralfate (CARAFATE) 1 GM/10ML suspension, Take 1 g by mouth 3 (three) times daily as needed. , Disp: , Rfl:  .  thiamine (VITAMIN B-1) 100 MG tablet, Take 1 tablet (100 mg total) by mouth daily., Disp: 90 tablet, Rfl: 6 .  traZODone (DESYREL) 50 MG tablet, Take 50 mg by mouth at bedtime., Disp: , Rfl:  .  ciprofloxacin (CIPRO) 500 MG tablet, Take 500 mg by mouth 2 (two) times a week. Monday and friday, Disp: , Rfl:  .  Oxycodone HCl 20 MG TABS, Take 1 tablet (20 mg total) by mouth every 6 (six) hours., Disp: 120 tablet, Rfl: 0 .  Oxycodone HCl 20 MG TABS, Take 1 tablet (20 mg total) by mouth every 6 (six) hours as needed., Disp: 120 tablet, Rfl: 0  Physical exam:  Vitals:   07/29/16 0917  BP: 106/69  Pulse: 82  Resp: 16  Temp: 98.4 F (36.9 C)  TempSrc: Tympanic  Weight: 151 lb 11.2 oz (68.8 kg)   Physical Exam  Constitutional: She is oriented to person, place, and time and well-developed, well-nourished, and in no distress.  HENT:  Head: Normocephalic and atraumatic.  Eyes: EOM are normal. Pupils are equal, round, and reactive to light.  Neck: Normal range of motion.  Cardiovascular: Normal rate, regular rhythm and normal heart sounds.   Pulmonary/Chest: Effort normal and breath sounds normal.  Abdominal: Soft. Bowel sounds are normal.  Neurological: She is alert and oriented to person, place, and time.  Skin: Skin is warm and dry.     CMP Latest Ref Rng & Units 04/12/2016  Glucose 65 - 99 mg/dL 101(H)  BUN 6 - 20 mg/dL 7  Creatinine 0.44 - 1.00 mg/dL 0.69  Sodium 135 - 145 mmol/L 130(L)  Potassium 3.5 - 5.1 mmol/L 3.4(L)  Chloride 101 - 111 mmol/L 97(L)  CO2 22 - 32 mmol/L 26  Calcium 8.9 - 10.3 mg/dL 9.3  Total Protein 6.5 - 8.1 g/dL 7.3  Total Bilirubin 0.3 - 1.2 mg/dL 0.7  Alkaline Phos 38 - 126 U/L 59  AST 15 - 41 U/L 20  ALT 14 - 54 U/L 9(L)   CBC Latest Ref Rng & Units 07/29/2016  WBC 3.6 - 11.0 K/uL 7.6  Hemoglobin 12.0 - 16.0 g/dL 12.6  Hematocrit 35.0 - 47.0  % 36.7  Platelets 150 - 440 K/uL  147(L)    No images are attached to the encounter.  Dg Si Joints  Result Date: 07/16/2016 CLINICAL DATA:  Groin pain.  Prior back surgery. EXAM: BILATERAL SACROILIAC JOINTS - 3+ VIEW COMPARISON:  Right hip 07/12/2016. FINDINGS: Prior lumbosacral and bilateral sacroiliac fusion. Degenerative changes lumbar spine, both SI joints, both hips. No acute bony abnormality identified. No evidence of fracture. IMPRESSION: Postsurgical and degenerative change. No acute or focal abnormality. Electronically Signed   By: Marcello Moores  Register   On: 07/16/2016 14:24   Dg C-arm 1-60 Min-no Report  Result Date: 07/12/2016 Fluoroscopy was utilized by the requesting physician.  No radiographic interpretation.   Dg Hip Unilat W Or W/o Pelvis 2-3 Views Left  Result Date: 07/16/2016 CLINICAL DATA:  Left hip/ groin pain. EXAM: DG HIP (WITH OR WITHOUT PELVIS) 2-3V LEFT COMPARISON:  CT abdomen and pelvis 02/17/2016 FINDINGS: Sequelae of extensive posterior spinal fusion are again identified extending to the sacrum and with bilateral iliac screws present as well. Left hip joint space width is preserved without significant degenerative changes or erosion identified. Bone mineralization appears normal. A moderate amount of colonic stool is partially visualized. IMPRESSION: Unremarkable appearance of the left hip. Electronically Signed   By: Logan Bores M.D.   On: 07/16/2016 14:24   Dg Hip Unilat W Or W/o Pelvis 2-3 Views Right  Result Date: 07/16/2016 CLINICAL DATA:  Left hip/ groin pain. EXAM: DG HIP (WITH OR WITHOUT PELVIS) 2-3V RIGHT COMPARISON:  CT abdomen and pelvis 02/17/2016 FINDINGS: Sequelae of extensive posterior spinal fusion are again identified extending to the sacrum and with bilateral iliac screws present as well. Right hip joint space width is preserved without significant degenerative changes or erosion identified. Bone mineralization appears normal. A moderate amount of colonic  stool is partially visualized. IMPRESSION: Unremarkable appearance of the right hip. Electronically Signed   By: Logan Bores M.D.   On: 07/16/2016 14:25     Assessment and plan- Patient is a 56 y.o. female who sees [place] for iron deficiency anemia  1. IDA- H/H improved and she is not anemic today. Iron studies normal. Hold off on IV iron and check repeat blood work in 6 weeks. She will see me in 12 weeks. She still needs to have capsule endoscopy at some point for complete GI eval altough this could be from her intermittent diverticular bleed  2. Tobacco dependence- refer to [facility] for low dose screening CT program  3. h/o right kidney lesion- being followed by urology   Visit Diagnosis 1. Iron deficiency anemia due to chronic blood loss      Dr. Randa Evens, MD, MPH [facility] Pager- [phone] 07/29/2016 1:44 PM

## 2016-07-29 NOTE — Progress Notes (Signed)
Patient reports that iron infusions does not give her energy and she sleeps a lot.  She is wearing a facial mask today due to having a dental procedure done in near future.

## 2016-07-30 ENCOUNTER — Telehealth: Payer: Self-pay | Admitting: *Deleted

## 2016-07-30 DIAGNOSIS — Z87891 Personal history of nicotine dependence: Secondary | ICD-10-CM

## 2016-07-30 NOTE — Telephone Encounter (Signed)
Received referral for initial lung cancer screening scan. Contacted patient and obtained smoking history,(current, 84 pack year ) as well as answering questions related to screening process. Patient denies signs of lung cancer such as weight loss or hemoptysis. Patient denies comorbidity that would prevent curative treatment if lung cancer were found. Patient is scheduled for shared decision making visit and CT scan on 08/03/16.

## 2016-08-03 ENCOUNTER — Ambulatory Visit
Admission: RE | Admit: 2016-08-03 | Discharge: 2016-08-03 | Disposition: A | Discharge status: Home / Self Care | Payer: Medicare Other | Source: Ambulatory Visit | Attending: Oncology | Admitting: Oncology

## 2016-08-03 ENCOUNTER — Ambulatory Visit: Payer: Medicare Other | Attending: Pain Medicine

## 2016-08-03 ENCOUNTER — Inpatient Hospital Stay (HOSPITAL_BASED_OUTPATIENT_CLINIC_OR_DEPARTMENT_OTHER): Payer: Medicare Other | Admitting: Oncology

## 2016-08-03 ENCOUNTER — Encounter: Payer: Self-pay | Admitting: Oncology

## 2016-08-03 VITALS — BP 107/58

## 2016-08-03 DIAGNOSIS — G8929 Other chronic pain: Secondary | ICD-10-CM | POA: Insufficient documentation

## 2016-08-03 DIAGNOSIS — R918 Other nonspecific abnormal finding of lung field: Secondary | ICD-10-CM | POA: Insufficient documentation

## 2016-08-03 DIAGNOSIS — Z122 Encounter for screening for malignant neoplasm of respiratory organs: Secondary | ICD-10-CM

## 2016-08-03 DIAGNOSIS — M5416 Radiculopathy, lumbar region: Secondary | ICD-10-CM | POA: Diagnosis present

## 2016-08-03 DIAGNOSIS — M5441 Lumbago with sciatica, right side: Secondary | ICD-10-CM | POA: Diagnosis present

## 2016-08-03 DIAGNOSIS — M5432 Sciatica, left side: Secondary | ICD-10-CM | POA: Insufficient documentation

## 2016-08-03 DIAGNOSIS — M5442 Lumbago with sciatica, left side: Secondary | ICD-10-CM | POA: Diagnosis not present

## 2016-08-03 DIAGNOSIS — Z87891 Personal history of nicotine dependence: Secondary | ICD-10-CM | POA: Diagnosis not present

## 2016-08-03 DIAGNOSIS — F17210 Nicotine dependence, cigarettes, uncomplicated: Secondary | ICD-10-CM

## 2016-08-03 DIAGNOSIS — M5431 Sciatica, right side: Secondary | ICD-10-CM

## 2016-08-03 NOTE — Patient Instructions (Signed)
  Standing and holding onto something sturdy for support:    Gently squeeze your rear end muscles together to feel a gentle stretch in front of your hips.    Hold for 5 seconds at a time.    Perform throughout the day (at least 3 sets of 10)

## 2016-08-03 NOTE — Therapy (Signed)
[facility] [address], [place], [address] Phone: [phone]   Fax:  [phone]  Physical Therapy Evaluation  Patient Details  Name: Audrey Garrett MRN: [id number] Date of Birth: December 25, 1960 Referring Provider: Joanna Hews, MD  Encounter Date: 08/03/2016      PT End of Session - 08/03/16 0803    Visit Number 1   Number of Visits 13   Date for PT Re-Evaluation 09/16/16   Authorization Type 1   Authorization Time Period of 10   PT Start Time 0806   PT Stop Time 0935   PT Time Calculation (min) 89 min   Activity Tolerance Patient tolerated treatment well   Behavior During Therapy [facility] for tasks assessed/performed      Past Medical History:  Diagnosis Date  . Alcohol abuse   . Alcoholic cirrhosis of liver without ascites ([place])   . Anemia   . Anxiety   . Arthropathy   . Back pain   . Bronchitis   . BRONCHITIS, ACUTE 11/01/2009   Qualifier: Diagnosis of  By: Alveta Heimlich MD, Cornelia Copa    . Chronic hepatitis C ([place])   . Chronic LBP 12/25/2014   Overview:  Post extensive surgery   . Cirrhosis ([place])   . DDD (degenerative disc disease), lumbar 01/01/2015  . Depression   . Diabetes mellitus without complication ([place])   . Diverticulosis   . Edema   . Fatigue   . Fibromyalgia   . GERD (gastroesophageal reflux disease)    gastroparesis  . High risk medications (not anticoagulants) long-term use   . Hyperglycemia   . Hypertension   . Kidney mass    11/17 small mass-  . Left leg pain 01/01/2015  . Lumbago   . Lumbar radicular pain 01/01/2015  . Muscle spasm   . Neck pain 01/01/2015  . Polyarthritis   . Reflux   . Restless leg syndrome    01/2016  . RLS (restless legs syndrome)   . Sacroiliac pain 01/01/2015  . Shoulder pain   . Sinusitis   . SOB (shortness of breath)   . Spine disorder   . Stress headaches   . Upper back pain 01/01/2015  . Urinary frequency   . Vitamin D deficiency disease     Past  Surgical History:  Procedure Laterality Date  . BACK SURGERY  12/19/2011  . BACK SURGERY    . COLONOSCOPY WITH PROPOFOL N/A 09/25/2015   Procedure: COLONOSCOPY WITH PROPOFOL;  Surgeon: Lollie Sails, MD;  Location: [facility] ENDOSCOPY;  Service: Endoscopy;  Laterality: N/A;  . COLONOSCOPY WITH PROPOFOL N/A 04/01/2016   Procedure: COLONOSCOPY WITH PROPOFOL;  Surgeon: Lollie Sails, MD;  Location: [facility] ENDOSCOPY;  Service: Endoscopy;  Laterality: N/A;  . ESOPHAGOGASTRODUODENOSCOPY N/A 04/14/2015   Procedure: ESOPHAGOGASTRODUODENOSCOPY (EGD);  Surgeon: Lollie Sails, MD;  Location: [facility] ENDOSCOPY;  Service: Endoscopy;  Laterality: N/A;  . ESOPHAGOGASTRODUODENOSCOPY (EGD) WITH PROPOFOL N/A 09/16/2015   Procedure: ESOPHAGOGASTRODUODENOSCOPY (EGD) WITH PROPOFOL;  Surgeon: Lollie Sails, MD;  Location: [facility] ENDOSCOPY;  Service: Endoscopy;  Laterality: N/A;  . ESOPHAGOGASTRODUODENOSCOPY (EGD) WITH PROPOFOL N/A 01/22/2016   Procedure: ESOPHAGOGASTRODUODENOSCOPY (EGD) WITH PROPOFOL;  Surgeon: [name], MD;  Location: [place];  Service: Endoscopy;  Laterality: N/A;  . ESOPHAGOGASTRODUODENOSCOPY (EGD) WITH PROPOFOL N/A 04/01/2016   Procedure: ESOPHAGOGASTRODUODENOSCOPY (EGD) WITH PROPOFOL;  Surgeon: Lollie Sails, MD;  Location: [facility] ENDOSCOPY;  Service: Endoscopy;  Laterality: N/A;  . HERNIA REPAIR N/A 10/2015  abdominal   . TONSILLECTOMY    . TUBAL LIGATION    . UMBILICAL HERNIA REPAIR N/A 11/06/2015   Procedure: HERNIA REPAIR UMBILICAL ADULT;  Surgeon: [name], MD;  Location: [facility] ORS;  Service: General;  Laterality: N/A;    Vitals:   08/03/16 0813  BP: (!) 107/58         Subjective Assessment - 08/03/16 0816    Subjective 4/10 back pain currently 6/10 L LE pain, and 2/10 R LE (leg to foot)  currently (pt sitting on a chair with back rest); 2/10 back pain at best for the past month (mid afternoon after taking her second pain medication), 4/10 L LE at best, 2/10 R  LE at best for the past month.   8/10 back pain at worst for the past month, 9/10 L LE, 4/10 R LE at most for the past month.  Pt adds that her belly swells due to her liver. Also has a hx of infusions due to bleeding somewhere in which her doctors feel like its from diverticulitis.    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 Pt states in 2013, when she turned 55 years old, her scoliosis increased which caused a lot of pain leading to her surgery. The surgery helped with her mid back pain. Currently has a burning feeling across her bra line. Also feels pain at her L groin area in which her MD said that it was coming from her hip. Pt got an x-ray for her hip. Does not know the result yet.  Her main pain area is at her L low back (sacroiliac joint) but also feels pain going down her L LE.  Pt states feeling like there is a turnicate around her L hip which feels like blood flow is stopped. Feels a numbful pain currently (pt is sitting down). Pt also states that she is diabetic and a recovering hepatitis C patient.  Standing helps loosen up her tight tournicate feeling.  Pt states that here is nothing that she can do that is not going to hurt.  Does not want to hurt her back more.  Denies loss of bowel or bladder control or saddle anesthesia.  Pt adds that she is at stage 4 liver disease.    Patient Stated Goals Be better able to bend, play ball or badminton with grand children. I would love to see some relief for my feet.    Currently in Pain? Yes   Pain Score 6    Pain Location Back   Pain Orientation Lower;Left   Pain Descriptors / Indicators Aching;Burning;Cramping;Pins and needles;Numbness   Pain Type Chronic pain   Pain Onset More than a month ago   Pain Frequency Constant   Aggravating Factors  lying down in bed in any position, standing and doing dishes, bending to do garden work, walking her dog   Pain Relieving Factors oxycodone, lying down on her recliner, standing and  rocking side to side            [facility] PT Assessment - 08/03/16 0844      Assessment   Medical Diagnosis 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 Chronic condition   Prior Therapy No known PT for current condition     Precautions   Precaution Comments Hepatitis C, thoracolumbar fusion     Restrictions   Other Position/Activity  Restrictions No lumbar traction     Balance Screen   Has the patient fallen in the past 6 months No   Has the patient had a decrease in activity level because of a fear of falling?  No  Pt states fear of falling.    Is the patient reluctant to leave their home because of a fear of falling?  No  Pt states fear of falling.      Home Environment   Additional Comments Pt lives in a 2 story home with her husband, no stairs to enter. 15 steps inside with R rail.      Prior Function   Vocation On disability  Since 2013 due to her back   Vocation Requirements PLOF: better able to bend over to perform garden work, walk her dog, perform chores, wash dishes, tolerate positions such as standing and lying down.      Observation/Other Assessments   Observations Pt states that the turnicate feeling in her back is better when standing. Worse when sitting at an elevated mat table (less than 90 degrees hip flexion).    Modified [place] 68%     Posture/Postural Control   Posture Comments R lateral lean, decreased weight bearing L LE, decreased bilateral hip extension;  bilateral foot pronation, bilateral genu valgus L > R     PROM   Overall PROM Comments hip extension: R -5 degrees with back pain; L  -10 with medial L thigh pain.   90/90 position hip IR : 31 degrees L with pain superior to greater trochanter at glute med area, 48 degrees R hip IR with slight lateral hip discomfort around glute med area, decreases with rest     Strength   Right Hip  Flexion 4/5   Right Hip ABduction 4+/5   Left Hip Flexion 4/5   Left Hip ABduction 4/5   Right Knee Flexion 4/5   Right Knee Extension 5/5   Left Knee Flexion 4/5   Left Knee Extension 5/5   Right Ankle Dorsiflexion 5/5   Left Ankle Dorsiflexion 5/5     Palpation   Palpation comment TTP L PSIS, L glute med     Ambulation/Gait   Gait Comments Decreased stance L LE, forward flexed, R lateral lean, decreased bilateral hip extension. Stiff trunk.      Pt states that the turnicate feeling in her back is better when standing. Worse when sitting at an elevated mat table (less than 90 degrees hip flexion).      Objectives   There-ex  Gentle manual IR of L hip in 90/90 position. No change in L L E symptoms.   recumbent bike but hips less than 90 degrees flexion (pt leaning back on chair seat with back supported) at lowest level, 5 min seat 13. Pt states liking the exercise.    Then 5 min at seat 9 (hips about 90 degrees flexion) x 5 min at lowest setting. No charge with bike.    Pt states that her pain is not different after the bike exercises but overall it feels good.    Standing glute max squeeze 10x2 with 5 second holds.  Reviewed HEP. Pt demonstrates and verbalizes understanding.   Improved exercise technique, movement at target joints, use of target muscles after mod verbal, visual, tactile cues.                      PT Education - 08/03/16 1147    Education provided Yes  Education Details ther-ex, HEP, plan of care   Person(s) Educated Patient   Methods Explanation;Demonstration;Tactile cues;Verbal cues;Handout   Comprehension Verbalized understanding;Returned demonstration             PT Long Term Goals - 08/03/16 0945      PT LONG TERM GOAL #1   Title Patient will have a decrease in low back pain to 6/10 or less at worst and L LE pain to 7/10 or less at worst to promote ability walk, perform chores, garden work, play with her grandchildren,  and tolerate positions such as standing and lying down.    Baseline 8/10 back pain and 9/10 L LE pain at worst for the past month (08/03/2016)   Time 6   Period Weeks   Status New     PT LONG TERM GOAL #2   Title Patient will improve L hip extension PROM to -5 degrees and R hip extension PROM to 0 degrees to promote ability to tolerate positions such as standing and lying in bed as well as walking.    Baseline PROM hip extension: R -5 degrees, L -10 degrees (08/03/2016)   Time 6   Period Weeks   Status New     PT LONG TERM GOAL #3   Title Patient will improve hre Modified Oswestry Low Back pain Disablity Questionnaire by at least 12% as a demonstration of improved function.    Baseline 68% (08/03/2016)   Time 6   Period Weeks   Status New               Plan - 08/03/16 0914    Clinical Impression Statement Patient is a 56 year old female who came to physical therapy secondary to chronic low back pain with with LE symptoms, L > R. She also presents with altered gait pattern and posture, TTP to L PSIS with reproduction of L low back pain, limited bilateral hip ROM L > R, and difficulty performing functional tasks such as beding over to perform garden work, walking her dog, and tolerating positions such as standing and lying down in bed. Patient will benefit from skilled physical therapy services to address the aforementioned deficits.    Rehab Potential Fair   Clinical Impairments Affecting Rehab Potential chronicity of condition, multiple co-morbidities   PT Frequency 2x / week   PT Duration 6 weeks   PT Treatment/Interventions Therapeutic activities;Therapeutic exercise;Manual techniques;Aquatic Therapy;Electrical Stimulation;Iontophoresis '4mg'$ /ml Dexamethasone;Neuromuscular re-education;Patient/family education   PT Next Visit Plan trunk, glute strengthening, scapular muscle use   Consulted and Agree with Plan of Care Patient      Patient will benefit from skilled therapeutic  intervention in order to improve the following deficits and impairments:  Pain, Postural dysfunction, Improper body mechanics, Difficulty walking, Decreased strength, Decreased range of motion  Visit Diagnosis: Chronic bilateral low back pain with bilateral sciatica - Plan: PT plan of care cert/re-cert  Radiculopathy, lumbar region - Plan: PT plan of care cert/re-cert  Sciatica, left side - Plan: PT plan of care cert/re-cert  Sciatica, right side - Plan: PT plan of care cert/re-cert      G-Codes - 20/35/59 1200    Functional Assessment Tool Used (Outpatient Only) Modified Oswestry Low Back Pain Disability Questionnaire, clinical presentation, patient interview.    Functional Limitation Mobility: Walking and moving around   Mobility: Walking and Moving Around Current Status [phone]) At least 60 percent but less than 80 percent impaired, limited or restricted   Mobility: Walking and Moving Around Goal Status (  [id number]) At least 40 percent but less than 60 percent impaired, limited or restricted       Problem List Patient Active Problem List   Diagnosis Date Noted  . Osteoarthritis of hip (Bilateral) 07/12/2016  . Chronic hip pain (Bilateral) 07/07/2016  . Bilateral leg edema 04/07/2016  . Opiate use 02/20/2016  . RLS (restless legs syndrome) 01/31/2016  . Iron deficiency anemia due to chronic blood loss 02/11/2016  . Controlled type 2 diabetes mellitus with hyperglycemia ([place]) 01/21/2016  . Acute blood loss anemia 01/21/2016  . Malnutrition of moderate degree 01/21/2016  . Chronic pain syndrome   . Hernia of anterior abdominal wall   . Incarcerated umbilical hernia   . Neurogenic pain 10/01/2015  . S/P thoracolumbar fusion (T5-L5) 10/01/2015  . Grade 1 Retrolisthesis (4 mm) of C5 over C6 and C6 over C7 10/01/2015  . Cervical foraminal stenosis (Left C5-6; Bilateral C6-7) 10/01/2015  . Acute GI bleeding 09/22/2015  . Depression, major, recurrent, moderate ([place]) 05/27/2015  .  Abdominal pain, chronic, epigastric 05/27/2015  . Difficulty urinating 05/26/2015  . Nephrolithiasis 05/26/2015  . Alcoholic cirrhosis of liver with ascites ([place]) 05/26/2015  . Hypotension 05/22/2015  . Anemia 05/22/2015  . Thrombocytopenia ([place]) 05/22/2015  . Coagulopathy ([place]) 05/22/2015  . Hyperglycemia 05/22/2015  . Polyneuropathy 05/22/2015  . Urinary retention 05/22/2015  . Hepatic cirrhosis ([place]) 05/19/2015  . Hypomagnesemia (low magnesium levels) 04/07/2015  . At risk for osteopenia 04/03/2015  . Lumbar spondylosis 04/03/2015  . Chronic lumbar radicular pain (Location of Secondary source of pain) (Left) 04/03/2015  . Chronic neck pain 04/03/2015  . Chronic lower extremity pain (Left) 04/03/2015  . Chronic sacroiliac joint pain 04/03/2015  . Chronic upper back pain 04/03/2015  . Long term current use of opiate analgesic 04/03/2015  . Encounter for chronic pain management 04/03/2015  . Cervical radicular pain 01/01/2015  . Uncomplicated opioid dependence ([place]) 01/01/2015  . Long term prescription opiate use 01/01/2015  . 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 Lumbar facet syndrome (Bilateral) (L>R) 01/01/2015  . Osteoarthritis of spine with radiculopathy, lumbar region 01/01/2015  . Pain of paraspinal muscle 01/01/2015  . Chronic low back pain (Location of Primary Source of Pain) (Bilateral) (L>R) 01/01/2015  . Insomnia, persistent 12/25/2014  . BP (high blood pressure) 12/25/2014  . HCV (hepatitis C virus) 12/25/2014  . Depression, major, in remission [place]) 12/25/2014     Joneen Boers PT, DPT   08/03/2016, 12:09 PM   [facility] [address], [place], [address] Phone: [phone]   Fax:  [phone]  Name: Audrey Garrett MRN: [id number] Date of Birth: 1960/05/04

## 2016-08-04 ENCOUNTER — Other Ambulatory Visit: Payer: Self-pay | Admitting: *Deleted

## 2016-08-04 ENCOUNTER — Ambulatory Visit: Payer: Medicare Other | Attending: Pain Medicine | Admitting: Pain Medicine

## 2016-08-04 ENCOUNTER — Encounter: Payer: Self-pay | Admitting: Pain Medicine

## 2016-08-04 VITALS — BP 104/62 | HR 70 | Temp 98.4°F | Resp 18 | Ht 70.0 in | Wt 154.0 lb

## 2016-08-04 DIAGNOSIS — M4312 Spondylolisthesis, cervical region: Secondary | ICD-10-CM | POA: Diagnosis not present

## 2016-08-04 DIAGNOSIS — E1365 Other specified diabetes mellitus with hyperglycemia: Secondary | ICD-10-CM | POA: Diagnosis not present

## 2016-08-04 DIAGNOSIS — D696 Thrombocytopenia, unspecified: Secondary | ICD-10-CM | POA: Diagnosis not present

## 2016-08-04 DIAGNOSIS — G8929 Other chronic pain: Secondary | ICD-10-CM

## 2016-08-04 DIAGNOSIS — G4700 Insomnia, unspecified: Secondary | ICD-10-CM | POA: Insufficient documentation

## 2016-08-04 DIAGNOSIS — M4726 Other spondylosis with radiculopathy, lumbar region: Secondary | ICD-10-CM | POA: Diagnosis not present

## 2016-08-04 DIAGNOSIS — M961 Postlaminectomy syndrome, not elsewhere classified: Secondary | ICD-10-CM

## 2016-08-04 DIAGNOSIS — B1920 Unspecified viral hepatitis C without hepatic coma: Secondary | ICD-10-CM | POA: Diagnosis not present

## 2016-08-04 DIAGNOSIS — D649 Anemia, unspecified: Secondary | ICD-10-CM | POA: Insufficient documentation

## 2016-08-04 DIAGNOSIS — E46 Unspecified protein-calorie malnutrition: Secondary | ICD-10-CM | POA: Diagnosis not present

## 2016-08-04 DIAGNOSIS — M4802 Spinal stenosis, cervical region: Secondary | ICD-10-CM | POA: Diagnosis not present

## 2016-08-04 DIAGNOSIS — M47816 Spondylosis without myelopathy or radiculopathy, lumbar region: Secondary | ICD-10-CM

## 2016-08-04 DIAGNOSIS — F338 Other recurrent depressive disorders: Secondary | ICD-10-CM | POA: Diagnosis not present

## 2016-08-04 DIAGNOSIS — Z87442 Personal history of urinary calculi: Secondary | ICD-10-CM | POA: Insufficient documentation

## 2016-08-04 DIAGNOSIS — I10 Essential (primary) hypertension: Secondary | ICD-10-CM | POA: Diagnosis not present

## 2016-08-04 DIAGNOSIS — Z79891 Long term (current) use of opiate analgesic: Secondary | ICD-10-CM | POA: Insufficient documentation

## 2016-08-04 DIAGNOSIS — M5442 Lumbago with sciatica, left side: Secondary | ICD-10-CM

## 2016-08-04 DIAGNOSIS — M5416 Radiculopathy, lumbar region: Secondary | ICD-10-CM

## 2016-08-04 DIAGNOSIS — K7031 Alcoholic cirrhosis of liver with ascites: Secondary | ICD-10-CM | POA: Insufficient documentation

## 2016-08-04 DIAGNOSIS — M533 Sacrococcygeal disorders, not elsewhere classified: Secondary | ICD-10-CM | POA: Insufficient documentation

## 2016-08-04 DIAGNOSIS — M4310 Spondylolisthesis, site unspecified: Secondary | ICD-10-CM | POA: Diagnosis not present

## 2016-08-04 DIAGNOSIS — M545 Low back pain: Secondary | ICD-10-CM | POA: Insufficient documentation

## 2016-08-04 DIAGNOSIS — M160 Bilateral primary osteoarthritis of hip: Secondary | ICD-10-CM

## 2016-08-04 DIAGNOSIS — M542 Cervicalgia: Secondary | ICD-10-CM | POA: Insufficient documentation

## 2016-08-04 DIAGNOSIS — M4696 Unspecified inflammatory spondylopathy, lumbar region: Secondary | ICD-10-CM

## 2016-08-04 DIAGNOSIS — Z87891 Personal history of nicotine dependence: Secondary | ICD-10-CM | POA: Insufficient documentation

## 2016-08-04 NOTE — Progress Notes (Signed)
[name] Name: Audrey Garrett  MRN: [id number]  Referring Provider: Jodi Marble, MD  DOB: [birth year]  PCP: Jodi Marble, MD  DOS: 08/04/2016  Note by: Kathlen Brunswick. Dossie Arbour, MD  Service setting: Ambulatory outpatient  Specialty: Interventional Pain Management  Location: [facility] (AMB) Pain Management Facility    Patient type: Established   Primary Reason(s) for Visit: Encounter for post-procedure evaluation of chronic illness with mild to moderate exacerbation CC: Back Pain (low left)  HPI  Audrey Garrett is a 56 y.o. year old, female patient, who comes today for a post-procedure evaluation. She has Insomnia, persistent; BP (high blood pressure); Cervical radicular pain; Uncomplicated opioid dependence ([place]); Long term prescription opiate use; Failed back surgical syndrome; Lumbar facet syndrome (Bilateral) (L>R); Osteoarthritis of spine with radiculopathy, lumbar region; Pain of paraspinal muscle; Chronic low back pain (Location of Primary Source of Pain) (Bilateral) (L>R); HCV (hepatitis C virus); At risk for osteopenia; Lumbar spondylosis; Chronic lumbar radicular pain (Location of Secondary source of pain) (Left); Chronic neck pain; Chronic lower extremity pain (Left); Chronic sacroiliac joint pain; Chronic upper back pain; Long term current use of opiate analgesic; Encounter for chronic pain management; Hypomagnesemia (low magnesium levels); Hypotension; Anemia; Thrombocytopenia ([place]); Coagulopathy ([place]); Hyperglycemia; Polyneuropathy; Urinary retention; Difficulty urinating; Nephrolithiasis; Alcoholic cirrhosis of liver with ascites ([place]); Depression, major, recurrent, moderate ([place]); Abdominal pain, chronic, epigastric; Hepatic cirrhosis ([place]); Acute GI bleeding; Neurogenic pain; S/P thoracolumbar fusion (T5-L5); Grade 1 Retrolisthesis (4 mm) of C5 over C6 and C6 over C7; Cervical foraminal stenosis (Left C5-6; Bilateral C6-7); Hernia of anterior abdominal wall; Incarcerated umbilical hernia;  Controlled type 2 diabetes mellitus with hyperglycemia ([place]); Acute blood loss anemia; Chronic pain syndrome; Malnutrition of moderate degree; Iron deficiency anemia due to chronic blood loss; Opiate use; RLS (restless legs syndrome); Bilateral leg edema; Depression, major, in remission ([place]); Chronic hip pain (Bilateral); and Osteoarthritis of hip (Bilateral) on her problem list. Her primarily concern today is the Back Pain (low left)  Pain Assessment: Self-Reported Pain Score: 3 /10             Reported level is compatible with observation.       Pain Type: Chronic pain Pain Location: Back Pain Orientation: Lower, Left Pain Descriptors / Indicators: Aching, Burning, Cramping, Numbness, Pins and needles, Dull Pain Frequency: Constant  Audrey Garrett comes in today for post-procedure evaluation after the treatment done on 07/12/2016.  Further details on both, my assessment(s), as well as the proposed treatment plan, please see below.  Post-Procedure Assessment  07/12/2016 Procedure: Diagnostic bilateral intra-articular hip joint injection under fluoroscopic guidance and IV sedation Pre-procedure pain score:  5/10 Post-procedure pain score: 0/10 (100% relief) Influential Factors: BMI: 22.10 kg/m Intra-procedural challenges: None observed Assessment challenges: None detected         Post-procedural side-effects, adverse reactions, or complications: None reported Reported issues: None  Sedation: Sedation provided. When no sedatives are used, the analgesic levels obtained are directly associated to the effectiveness of the local anesthetics. However, when sedation is provided, the level of analgesia obtained during the initial 1 hour following the intervention, is believed to be the result of a combination of factors. These factors may include, but are not limited to: 1. The effectiveness of the local anesthetics used. 2. The effects of the analgesic(s) and/or anxiolytic(s) used. 3. The degree of  discomfort experienced by the patient at the time of the procedure. 4. The patients ability and reliability in recalling and recording the events. 5. The presence and  influence of possible secondary gains and/or psychosocial factors. Reported result: Relief experienced during the 1st hour after the procedure: 100 % (Ultra-Short Term Relief) Interpretative annotation: Analgesia during this period is likely to be Local Anesthetic and/or IV Sedative (Analgesic/Anxiolitic) related.          Effects of local anesthetic: The analgesic effects attained during this period are directly associated to the localized infiltration of local anesthetics and therefore cary significant diagnostic value as to the etiological location, or anatomical origin, of the pain. Expected duration of relief is directly dependent on the pharmacodynamics of the local anesthetic used. Long-acting (4-6 hours) anesthetics used.  Reported result: Relief during the next 4 to 6 hour after the procedure: 100 % (Short-Term Relief) Interpretative annotation: Complete relief would suggest area to be the source of the pain.          Long-term benefit: Defined as the period of time past the expected duration of local anesthetics. With the possible exception of prolonged sympathetic blockade from the local anesthetics, benefits during this period are typically attributed to, or associated with, other factors such as analgesic sensory neuropraxia, antiinflammatory effects, or beneficial biochemical changes provided by agents other than the local anesthetics Reported result: Extended relief following procedure: 25 % (ongoing) (Long-Term Relief) Interpretative annotation: Good relief. This could suggest inflammation to be a significant component in the etiology to the pain.          Current benefits: Defined as persistent relief that continues at this point in time.   Reported results: Treated area: 25 % Ms. Mcgrory reports improvement in  function Interpretative annotation: Ongoing benefits would suggest effective therapeutic approach  Interpretation: Results would suggest a successful diagnostic intervention.          Laboratory Chemistry  Inflammation Markers Lab Results  Component Value Date   CRP <0.5 04/03/2015   ESRSEDRATE 27 04/03/2015   (CRP: Acute Phase) (ESR: Chronic Phase) Renal Function Markers Lab Results  Component Value Date   BUN 7 04/12/2016   CREATININE 0.69 04/12/2016   GFRAA >60 04/12/2016   GFRNONAA >60 04/12/2016   Hepatic Function Markers Lab Results  Component Value Date   AST 20 04/12/2016   ALT 9 (L) 04/12/2016   ALBUMIN 3.5 04/12/2016   ALKPHOS 59 04/12/2016   Electrolytes Lab Results  Component Value Date   NA 130 (L) 04/12/2016   K 3.4 (L) 04/12/2016   CL 97 (L) 04/12/2016   CALCIUM 9.3 04/12/2016   MG 1.9 01/22/2016   Neuropathy Markers Lab Results  Component Value Date   VITAMINB12 591 02/13/2016   Bone Pathology Markers Lab Results  Component Value Date   ALKPHOS 59 04/12/2016   CALCIUM 9.3 04/12/2016   Coagulation Parameters Lab Results  Component Value Date   INR 1.21 04/01/2016   LABPROT 15.4 (H) 04/01/2016   APTT 34 01/20/2016   PLT 147 (L) 07/29/2016   Cardiovascular Markers Lab Results  Component Value Date   BNP 929 (H) 09/23/2013   HGB 12.6 07/29/2016   HCT 36.7 07/29/2016   Note: Lab results reviewed.  Recent Diagnostic Imaging Review  Ct Chest Lung Cancer Screening Low Dose Wo Contrast  Result Date: 08/03/2016 CLINICAL DATA:  56 year old female with 34 pack-year history of smoking. Lung cancer screening. EXAM: CT CHEST WITHOUT CONTRAST LOW-DOSE FOR LUNG CANCER SCREENING TECHNIQUE: Multidetector CT imaging of the chest was performed following the standard protocol without IV contrast. COMPARISON:  None. FINDINGS: Cardiovascular: The heart size is normal. No pericardial  effusion. Atherosclerotic calcification is noted in the wall of the  thoracic aorta. Mediastinum/Nodes: No mediastinal lymphadenopathy. No evidence for gross hilar lymphadenopathy although assessment is limited by the lack of intravenous contrast on today's study. The esophagus has normal imaging features. There is no axillary lymphadenopathy. Lungs/Pleura: Emphysema noted with bronchial wall thickening. Bilateral pulmonary nodules are identified, measuring up to maximum volume derived mean diameter of 11.6 mm (image 153) in the posterior right upper or middle lobe (minor fissure not well demonstrated in this region on any of the imaging planes). With retraction of the major fissure. Upper Abdomen: Small volume intraperitoneal free fluid noted adjacent to the liver. Musculoskeletal: Thoracolumbar fusion hardware incompletely visualized. IMPRESSION: 11.6 mm spiculated right lung nodule just anterior to the major fissure with associated fissure all retraction. Lung-RADS Category 4A, suspicious. Follow up low-dose chest CT without contrast in 3 months (please use the following order, "CT CHEST LCS NODULE FOLLOW-UP W/O CM") is recommended. Alternatively, PET may be considered when there is a solid component 72m or larger. Other bilateral pulmonary nodules evident.  Attention on follow-up. These results will be called to the ordering clinician or representative by the Radiologist Assistant, and communication documented in the PACS or zVision Dashboard. Electronically Signed   By: EMisty StanleyM.D.   On: 08/03/2016 16:51   Note: Imaging results reviewed.          Meds  The patient has a current medication list which includes the following prescription(s): calcium carbonate, furosemide, lactulose, magnesium oxide, multivitamin with minerals, omeprazole, oxycodone hcl, paroxetine, phytonadione, pregabalin, spironolactone, sucralfate, thiamine, trazodone, ciprofloxacin, oxycodone hcl, and oxycodone hcl.  Current Outpatient Prescriptions on File Prior to Visit  Medication Sig  .  calcium carbonate (TUMS - DOSED IN MG ELEMENTAL CALCIUM) 500 MG chewable tablet Chew 2 tablets by mouth 2 (two) times daily.  . furosemide (LASIX) 40 MG tablet Take by mouth 2 (two) times daily.   .Marland Kitchenlactulose (CHRONULAC) 10 GM/15ML solution Take 10 g by mouth 3 (three) times daily.   . Magnesium Oxide 250 MG TABS Take 1 tablet by mouth daily.  . Multiple Vitamin (MULTIVITAMIN WITH MINERALS) TABS tablet Take 1 tablet by mouth daily.  .Marland Kitchenomeprazole (PRILOSEC) 20 MG capsule Take 20 mg by mouth daily.  . Oxycodone HCl 20 MG TABS Take 1 tablet (20 mg total) by mouth every 6 (six) hours as needed.  .Marland KitchenPARoxetine (PAXIL) 40 MG tablet Take 40 mg by mouth every morning.  . phytonadione (VITAMIN K) 5 MG tablet Take 5 mg by mouth 3 (three) times a week. Mon, Wed, Fri  . pregabalin (LYRICA) 75 MG capsule Take 75 mg by mouth 2 (two) times daily.  .Marland Kitchenspironolactone (ALDACTONE) 50 MG tablet Take 100 mg by mouth daily.   . sucralfate (CARAFATE) 1 GM/10ML suspension Take 1 g by mouth 3 (three) times daily as needed.   . thiamine (VITAMIN B-1) 100 MG tablet Take 1 tablet (100 mg total) by mouth daily.  . traZODone (DESYREL) 50 MG tablet Take 50 mg by mouth at bedtime.  . ciprofloxacin (CIPRO) 500 MG tablet Take 500 mg by mouth 2 (two) times a week. Monday and friday  . Oxycodone HCl 20 MG TABS Take 1 tablet (20 mg total) by mouth every 6 (six) hours.  . Oxycodone HCl 20 MG TABS Take 1 tablet (20 mg total) by mouth every 6 (six) hours as needed.   No current facility-administered medications on file prior to visit.    ROS  Constitutional: Denies any fever or chills Gastrointestinal: No reported hemesis, hematochezia, vomiting, or acute GI distress Musculoskeletal: Denies any acute onset joint swelling, redness, loss of ROM, or weakness Neurological: No reported episodes of acute onset apraxia, aphasia, dysarthria, agnosia, amnesia, paralysis, loss of coordination, or loss of consciousness  Allergies  Ms. Lookingbill  has No Known Allergies.  PFSH  Drug: Ms. Gallion  reports that she does not use drugs. Alcohol:  reports that she does not drink alcohol. Tobacco:  reports that she has been smoking Cigarettes.  She has a 84.00 pack-year smoking history. She has never used smokeless tobacco. Medical:  has a past medical history of Alcohol abuse; Alcoholic cirrhosis of liver without ascites ([place]); Anemia; Anxiety; Arthropathy; Back pain; Bronchitis; BRONCHITIS, ACUTE (11/01/2009); Chronic hepatitis C ([place]); Chronic LBP (12/25/2014); Cirrhosis ([place]); DDD (degenerative disc disease), lumbar (01/01/2015); Depression; Diabetes mellitus without complication ([place]); Diverticulosis; Edema; Fatigue; Fibromyalgia; GERD (gastroesophageal reflux disease); High risk medications (not anticoagulants) long-term use; Hyperglycemia; Hypertension; Kidney mass; Left leg pain (01/01/2015); Lumbago; Lumbar radicular pain (01/01/2015); Muscle spasm; Neck pain (01/01/2015); Polyarthritis; Reflux; Restless leg syndrome; RLS (restless legs syndrome); Sacroiliac pain (01/01/2015); Shoulder pain; Sinusitis; SOB (shortness of breath); Spine disorder; Stress headaches; Upper back pain (01/01/2015); Urinary frequency; and Vitamin D deficiency disease. Family: family history includes [name] in her father; Breast cancer (age of onset: 65) in her sister; Heart disease in her mother; Stroke in her mother.  Past Surgical History:  Procedure Laterality Date  . BACK SURGERY  12/19/2011  . BACK SURGERY    . COLONOSCOPY WITH PROPOFOL N/A 09/25/2015   Procedure: COLONOSCOPY WITH PROPOFOL;  Surgeon: Lollie Sails, MD;  Location[facility] ENDOSCOPY;  Service: Endoscopy;  Laterality: N/A;  . COLONOSCOPY WITH PROPOFOL N/A 04/01/2016   Procedure: COLONOSCOPY WITH PROPOFOL;  Surgeon: Lollie Sails, MD;  Location: [facility] ENDOSCOPY;  Service: Endoscopy;  Laterality: N/A;  . ESOPHAGOGASTRODUODENOSCOPY N/A 04/14/2015   Procedure: ESOPHAGOGASTRODUODENOSCOPY (EGD);  Surgeon:  Lollie Sails, MD;  Location: [facility] ENDOSCOPY;  Service: Endoscopy;  Laterality: N/A;  . ESOPHAGOGASTRODUODENOSCOPY (EGD) WITH PROPOFOL N/A 09/16/2015   Procedure: ESOPHAGOGASTRODUODENOSCOPY (EGD) WITH PROPOFOL;  Surgeon: Lollie Sails, MD;  Location: [facility] ENDOSCOPY;  Service: Endoscopy;  Laterality: N/A;  . ESOPHAGOGASTRODUODENOSCOPY (EGD) WITH PROPOFOL N/A 01/22/2016   Procedure: ESOPHAGOGASTRODUODENOSCOPY (EGD) WITH PROPOFOL;  Surgeon: [name], MD;  Location: [facility];  Service: Endoscopy;  Laterality: N/A;  . ESOPHAGOGASTRODUODENOSCOPY (EGD) WITH PROPOFOL N/A 04/01/2016   Procedure: ESOPHAGOGASTRODUODENOSCOPY (EGD) WITH PROPOFOL;  Surgeon: Lollie Sails, MD;  Location: [facility] ENDOSCOPY;  Service: Endoscopy;  Laterality: N/A;  . HERNIA REPAIR N/A 10/2015   abdominal   . TONSILLECTOMY    . TUBAL LIGATION    . UMBILICAL HERNIA REPAIR N/A 11/06/2015   Procedure: HERNIA REPAIR UMBILICAL ADULT;  Surgeon: [name], MD;  Location: [facility] ORS;  Service: General;  Laterality: N/A;   Constitutional Exam  General appearance: Well nourished, well developed, and well hydrated. In no apparent acute distress Vitals:   08/04/16 1325  BP: 104/62  Pulse: 70  Resp: 18  Temp: 98.4 F (36.9 C)  SpO2: 98%  Weight: 154 lb (69.9 kg)  Height: _0  (1.778 m)   BMI Assessment: Estimated body mass index is 22.1 kg/m as calculated from the following:   Height as of this encounter: _1  (1.778 m).   Weight as of this encounter: 154 lb (69.9 kg).  BMI interpretation table: BMI level Category Range association with higher incidence of  chronic pain  <18 kg/m2 Underweight   18.5-24.9 kg/m2 Ideal body weight   25-29.9 kg/m2 Overweight Increased incidence by 20%  30-34.9 kg/m2 Obese (Class I) Increased incidence by 68%  35-39.9 kg/m2 Severe obesity (Class II) Increased incidence by 136%  >40 kg/m2 Extreme obesity (Class III) Increased incidence by 254%   BMI Readings from Last 4  Encounters:  08/04/16 22.10 kg/m  08/03/16 22.10 kg/m  07/29/16 21.77 kg/m  07/12/16 21.09 kg/m   Wt Readings from Last 4 Encounters:  08/04/16 154 lb (69.9 kg)  08/03/16 154 lb (69.9 kg)  07/29/16 151 lb 11.2 oz (68.8 kg)  07/12/16 147 lb (66.7 kg)  Psych/Mental status: Alert, oriented x 3 (person, place, & time)       Eyes: PERLA Respiratory: No evidence of acute respiratory distress  Cervical Spine Exam  Inspection: No masses, redness, or swelling Alignment: Symmetrical Functional ROM: Unrestricted ROM      Stability: No instability detected Muscle strength & Tone: Functionally intact Sensory: Unimpaired Palpation: No palpable anomalies              Upper Extremity (UE) Exam    Side: Right upper extremity  Side: Left upper extremity  Inspection: No masses, redness, swelling, or asymmetry. No contractures  Inspection: No masses, redness, swelling, or asymmetry. No contractures  Functional ROM: Unrestricted ROM          Functional ROM: Unrestricted ROM          Muscle strength & Tone: Functionally intact  Muscle strength & Tone: Functionally intact  Sensory: Unimpaired  Sensory: Unimpaired  Palpation: No palpable anomalies              Palpation: No palpable anomalies              Specialized Test(s): Deferred         Specialized Test(s): Deferred          Thoracic Spine Exam  Inspection: No masses, redness, or swelling Alignment: Symmetrical Functional ROM: Unrestricted ROM Stability: No instability detected Sensory: Unimpaired Muscle strength & Tone: No palpable anomalies  Lumbar Spine Exam  Inspection: No masses, redness, or swelling Alignment: Symmetrical Functional ROM: Decreased ROM      Stability: No instability detected Muscle strength & Tone: Functionally intact Sensory: Movement-associated discomfort Palpation: Complains of area being tender to palpation       Provocative Tests: Lumbar Hyperextension and rotation test: Positive bilaterally for facet  joint pain. Patrick's Maneuver: evaluation deferred today                    Gait & Posture Assessment  Ambulation: Unassisted Gait: Relatively normal for age and body habitus Posture: WNL   Lower Extremity Exam    Side: Right lower extremity  Side: Left lower extremity  Inspection: No masses, redness, swelling, or asymmetry. No contractures  Inspection: No masses, redness, swelling, or asymmetry. No contractures  Functional ROM: Unrestricted ROM          Functional ROM: Unrestricted ROM          Muscle strength & Tone: Functionally intact  Muscle strength & Tone: Functionally intact  Sensory: Unimpaired  Sensory: Unimpaired  Palpation: No palpable anomalies  Palpation: No palpable anomalies   Assessment  Primary Diagnosis & Pertinent Problem List: The primary encounter diagnosis was Chronic low back pain (Location of Primary Source of Pain) (Bilateral) (L>R). Diagnoses of Chronic lumbar radicular pain (Location of Secondary source of pain) (Left), Chronic neck pain, Failed  back surgical syndrome, Chronic sacroiliac joint pain, Grade 1 Retrolisthesis (4 mm) of C5 over C6 and C6 over C7, Lumbar facet syndrome (Bilateral) (L>R), and Osteoarthritis of hip (Bilateral) were also pertinent to this visit.  Status Diagnosis  Controlled Controlled Controlled 1. Chronic low back pain (Location of Primary Source of Pain) (Bilateral) (L>R)   2. Chronic lumbar radicular pain (Location of Secondary source of pain) (Left)   3. Chronic neck pain   4. Failed back surgical syndrome   5. Chronic sacroiliac joint pain   6. Grade 1 Retrolisthesis (4 mm) of C5 over C6 and C6 over C7   7. Lumbar facet syndrome (Bilateral) (L>R)   8. Osteoarthritis of hip (Bilateral)      Plan of Care  Pharmacotherapy (Medications Ordered): No orders of the defined types were placed in this encounter.  New Prescriptions   No medications on file   Medications administered today: Ms. Indelicato had no medications  administered during this visit. Lab-work, procedure(s), and/or referral(s): No orders of the defined types were placed in this encounter.  Imaging and/or referral(s): None  Interventional therapies: Planned, scheduled, and/or pending:   Not at this time.   Considering:   Diagnostic bilateral lumbar facet block  Possible bilateral lumbar facet RFA  Diagnostic left caudal Epidural steroid injection + diagnostic epidurogram  Possible Racz procedure  Diagnostic bilateral sacroiliac joint injection  Possible bilateral sacroiliac joint RFA  Diagnostic left cervical epidural steroid injection  Diagnostic bilateral cervical facet block  Possible bilateral cervical facet RFA    Palliative PRN treatment(s):   Diagnostic bilateral lumbar facet block under fluoroscopic guidance and IV sedation  Diagnostic bilateral intra-articular hip joint injection under fluoroscopic guidance and IV sedation  Diagnostic bilateral sacroiliac joint block under fluoroscopic guidance and IV sedation    Provider-requested follow-up: Return for keep scheduled appointment, Med-Mgmt, w/ NP.  Future Appointments Date Time Provider [place]  08/05/2016 1:00 PM [facility] CONFERENCE [facility] None  08/10/2016 9:00 AM Joneen Boers R, PT [facility] None  08/12/2016 9:00 AM Joneen Boers R, PT [facility] None  08/13/2016 9:30 AM Sindy Guadeloupe, MD [facility] None  08/16/2016 5:15 PM Madaline Savage, PT [facility] None  08/19/2016 10:00 AM [name], MD [facility] None  08/19/2016 3:15 PM Madaline Savage, PT [facility] None  08/25/2016 9:00 AM Joneen Boers R, PT [facility] None  08/26/2016 3:15 PM Joneen Boers R, PT [facility] None  08/30/2016 10:30 AM Joneen Boers R, PT [facility] None  09/02/2016 1:45 PM Joneen Boers R, PT [facility] None  09/06/2016 11:15 AM Madaline Savage, PT [facility] None  09/08/2016 10:45 AM Hollice Espy, MD [facility] None  09/08/2016 1:45 PM Joneen Boers R, PT [facility] None  09/09/2016  9:30 AM [facility]MO LAB [facility] None  09/13/2016 1:45 PM Joneen Boers R, PT [facility] None  09/16/2016 1:00 PM Madaline Savage, PT [facility] None  09/20/2016 11:15 AM Madaline Savage, PT [facility] None  09/23/2016 10:30 AM Madaline Savage, PT [facility] None  10/21/2016 10:30 AM [facility] LAB [facility] None  10/21/2016 10:45 AM Sindy Guadeloupe, MD [facility] None   Primary Care Physician: Jodi Marble, MD Location: [facility] Note by: Kathlen Brunswick Dossie Arbour, M.D, DABA, DABAPM, DABPM, DABIPP, FIPP Date: 08/04/2016; Time: 1:56 PM  Patient instructions provided during this appointment: Patient Instructions  Steps to Quit Smoking Smoking tobacco can be bad for your health. It can also affect almost every organ in your body. Smoking puts you and people around  you at risk for many serious long-lasting (chronic) diseases. Quitting smoking is hard, but it is one of the best things that you can do for your health. It is never too late to quit. What are the benefits of quitting smoking? When you quit smoking, you lower your risk for getting serious diseases and conditions. They can include:  Lung cancer or lung disease.  Heart disease.  Stroke.  Heart attack.  Not being able to have children (infertility).  Weak bones (osteoporosis) and broken bones (fractures). If you have coughing, wheezing, and shortness of breath, those symptoms may get better when you quit. You may also get sick less often. If you are pregnant, quitting smoking can help to lower your chances of having a baby of low birth weight. What can I do to help me quit smoking? Talk with your doctor about what can help you quit smoking. Some things you can do (strategies) include:  Quitting smoking totally, instead of slowly cutting back how much you smoke over a period of time.  Going to in-person counseling. You are more likely to quit if you go to many counseling sessions.  Using resources and support  systems, such as:  Online chats with a Social worker.  Phone quitlines.  Printed Furniture conservator/restorer.  Support groups or group counseling.  Text messaging programs.  Mobile phone apps or applications.  Taking medicines. Some of these medicines may have nicotine in them. If you are pregnant or breastfeeding, do not take any medicines to quit smoking unless your doctor says it is okay. Talk with your doctor about counseling or other things that can help you. Talk with your doctor about using more than one strategy at the same time, such as taking medicines while you are also going to in-person counseling. This can help make quitting easier. What things can I do to make it easier to quit? Quitting smoking might feel very hard at first, but there is a lot that you can do to make it easier. Take these steps:  Talk to your family and friends. Ask them to support and encourage you.  Call phone quitlines, reach out to support groups, or work with a Social worker.  Ask people who smoke to not smoke around you.  Avoid places that make you want (trigger) to smoke, such as:  Bars.  Parties.  Smoke-break areas at work.  Spend time with people who do not smoke.  Lower the stress in your life. Stress can make you want to smoke. Try these things to help your stress:  Getting regular exercise.  Deep-breathing exercises.  Yoga.  Meditating.  Doing a body scan. To do this, close your eyes, focus on one area of your body at a time from head to toe, and notice which parts of your body are tense. Try to relax the muscles in those areas.  Download or buy apps on your mobile phone or tablet that can help you stick to your quit plan. There are many free apps, such as QuitGuide from the [employer] Office manager for Disease Control and Prevention). You can find more support from smokefree.gov and other websites. This information is not intended to replace advice given to you by your health care provider. Make sure  you discuss any questions you have with your health care provider. Document Released: 01/09/2009 Document Revised: 11/11/2015 Document Reviewed: 07/30/2014 Elsevier Interactive Patient Education  2017 [employer].

## 2016-08-04 NOTE — Progress Notes (Signed)
Safety precautions to be maintained throughout the outpatient stay will include: orient to surroundings, keep bed in low position, maintain call bell within reach at all times, provide assistance with transfer out of bed and ambulation.  

## 2016-08-04 NOTE — Patient Instructions (Signed)
Steps to Quit Smoking Smoking tobacco can be bad for your health. It can also affect almost every organ in your body. Smoking puts you and people around you at risk for many serious long-lasting (chronic) diseases. Quitting smoking is hard, but it is one of the best things that you can do for your health. It is never too late to quit. What are the benefits of quitting smoking? When you quit smoking, you lower your risk for getting serious diseases and conditions. They can include:  Lung cancer or lung disease.  Heart disease.  Stroke.  Heart attack.  Not being able to have children (infertility).  Weak bones (osteoporosis) and broken bones (fractures). If you have coughing, wheezing, and shortness of breath, those symptoms may get better when you quit. You may also get sick less often. If you are pregnant, quitting smoking can help to lower your chances of having a baby of low birth weight. What can I do to help me quit smoking? Talk with your doctor about what can help you quit smoking. Some things you can do (strategies) include:  Quitting smoking totally, instead of slowly cutting back how much you smoke over a period of time.  Going to in-person counseling. You are more likely to quit if you go to many counseling sessions.  Using resources and support systems, such as:  Online chats with a counselor.  Phone quitlines.  Printed self-help materials.  Support groups or group counseling.  Text messaging programs.  Mobile phone apps or applications.  Taking medicines. Some of these medicines may have nicotine in them. If you are pregnant or breastfeeding, do not take any medicines to quit smoking unless your doctor says it is okay. Talk with your doctor about counseling or other things that can help you. Talk with your doctor about using more than one strategy at the same time, such as taking medicines while you are also going to in-person counseling. This can help make quitting  easier. What things can I do to make it easier to quit? Quitting smoking might feel very hard at first, but there is a lot that you can do to make it easier. Take these steps:  Talk to your family and friends. Ask them to support and encourage you.  Call phone quitlines, reach out to support groups, or work with a counselor.  Ask people who smoke to not smoke around you.  Avoid places that make you want (trigger) to smoke, such as:  Bars.  Parties.  Smoke-break areas at work.  Spend time with people who do not smoke.  Lower the stress in your life. Stress can make you want to smoke. Try these things to help your stress:  Getting regular exercise.  Deep-breathing exercises.  Yoga.  Meditating.  Doing a body scan. To do this, close your eyes, focus on one area of your body at a time from head to toe, and notice which parts of your body are tense. Try to relax the muscles in those areas.  Download or buy apps on your mobile phone or tablet that can help you stick to your quit plan. There are many free apps, such as QuitGuide from the CDC (Centers for Disease Control and Prevention). You can find more support from smokefree.gov and other websites. This information is not intended to replace advice given to you by your health care provider. Make sure you discuss any questions you have with your health care provider. Document Released: 01/09/2009 Document Revised: 11/11/2015 Document   Reviewed: 07/30/2014 Elsevier Interactive Patient Education  2017 [employer].  

## 2016-08-04 NOTE — Progress Notes (Signed)
In accordance with CMS guidelines, patient has met eligibility criteria including age, absence of signs or symptoms of lung cancer.  Social History  Substance Use Topics  . Smoking status: Current Every Day Smoker    Packs/day: 2.00    Years: 42.00    Types: Cigarettes  . Smokeless tobacco: Never Used     Comment: 2ppd until last 2 years  . Alcohol use No     Comment: stopped drinking 05/12/15     A shared decision-making session was conducted prior to the performance of CT scan. This includes one or more decision aids, includes benefits and harms of screening, follow-up diagnostic testing, over-diagnosis, false positive rate, and total radiation exposure.  Counseling on the importance of adherence to annual lung cancer LDCT screening, impact of co-morbidities, and ability or willingness to undergo diagnosis and treatment is imperative for compliance of the program.  Counseling on the importance of continued smoking cessation for former smokers; the importance of smoking cessation for current smokers, and information about tobacco cessation interventions have been given to patient including [place] and 1800 quit [place] programs.  Written order for lung cancer screening with LDCT has been given to the patient and any and all questions have been answered to the best of my abilities.   Yearly follow up will be coordinated by Burgess Estelle, Thoracic Navigator.

## 2016-08-05 ENCOUNTER — Other Ambulatory Visit: Payer: Self-pay | Admitting: Oncology

## 2016-08-05 ENCOUNTER — Ambulatory Visit: Payer: 59

## 2016-08-05 DIAGNOSIS — R918 Other nonspecific abnormal finding of lung field: Secondary | ICD-10-CM

## 2016-08-06 ENCOUNTER — Other Ambulatory Visit: Payer: Self-pay | Admitting: Oncology

## 2016-08-06 ENCOUNTER — Other Ambulatory Visit: Payer: Self-pay | Admitting: *Deleted

## 2016-08-06 DIAGNOSIS — F17200 Nicotine dependence, unspecified, uncomplicated: Secondary | ICD-10-CM

## 2016-08-06 DIAGNOSIS — R918 Other nonspecific abnormal finding of lung field: Secondary | ICD-10-CM

## 2016-08-09 ENCOUNTER — Encounter: Payer: Self-pay | Admitting: *Deleted

## 2016-08-09 NOTE — Progress Notes (Signed)
  Oncology Nurse Navigator Documentation  Navigator Location: [facility] (08/09/16 0800)   )Navigator Encounter Type: Telephone (08/09/16 0800)                             Interventions: Coordination of Care (08/09/16 0800)   Coordination of Care: Appts;Radiology (08/09/16 0800)         Phone call made to patient to inform her of her PET scan scheduled for Wednesday at 10:30am and pt was instructed to [facility] 10am [facility] mall. Instructed to remain NPO after midnight, only drink water if needed, may take meds with small sip of water. Follow up appt with Dr. Janese Banks on Friday at 9:30am at the [facility], [place] given to patient. Pt verbalized understanding. Informed to call with any further questions.         Time Spent with Patient: 30 (08/09/16 0800)

## 2016-08-10 ENCOUNTER — Ambulatory Visit: Payer: Medicare Other

## 2016-08-10 ENCOUNTER — Ambulatory Visit: Payer: Medicare Other | Attending: Oncology

## 2016-08-10 DIAGNOSIS — R918 Other nonspecific abnormal finding of lung field: Secondary | ICD-10-CM | POA: Insufficient documentation

## 2016-08-10 DIAGNOSIS — J449 Chronic obstructive pulmonary disease, unspecified: Secondary | ICD-10-CM | POA: Diagnosis not present

## 2016-08-11 ENCOUNTER — Encounter
Admission: RE | Admit: 2016-08-11 | Discharge: 2016-08-11 | Disposition: A | Discharge status: Home / Self Care | Payer: Medicare Other | Source: Ambulatory Visit | Attending: Oncology | Admitting: Oncology

## 2016-08-11 DIAGNOSIS — F17200 Nicotine dependence, unspecified, uncomplicated: Secondary | ICD-10-CM | POA: Diagnosis present

## 2016-08-11 DIAGNOSIS — R918 Other nonspecific abnormal finding of lung field: Secondary | ICD-10-CM | POA: Diagnosis present

## 2016-08-11 LAB — GLUCOSE, CAPILLARY: Glucose-Capillary: 138 mg/dL — ABNORMAL HIGH (ref 65–99)

## 2016-08-11 MED ORDER — FLUDEOXYGLUCOSE F - 18 (FDG) INJECTION
12.0000 | Freq: Once | INTRAVENOUS | Status: AC | PRN
  Administered 2016-08-11: 12.38 via INTRAVENOUS

## 2016-08-12 ENCOUNTER — Other Ambulatory Visit: Payer: Self-pay | Admitting: *Deleted

## 2016-08-12 ENCOUNTER — Ambulatory Visit: Payer: 59 | Admitting: Oncology

## 2016-08-12 ENCOUNTER — Ambulatory Visit: Payer: Medicare Other

## 2016-08-12 ENCOUNTER — Other Ambulatory Visit: Payer: 59

## 2016-08-13 ENCOUNTER — Encounter: Payer: Self-pay | Admitting: Oncology

## 2016-08-13 ENCOUNTER — Encounter: Payer: Self-pay | Admitting: *Deleted

## 2016-08-13 ENCOUNTER — Inpatient Hospital Stay (HOSPITAL_BASED_OUTPATIENT_CLINIC_OR_DEPARTMENT_OTHER): Payer: Medicare Other | Admitting: Oncology

## 2016-08-13 ENCOUNTER — Telehealth: Payer: Self-pay

## 2016-08-13 VITALS — BP 112/76 | HR 76 | Temp 97.7°F | Resp 18 | Wt 155.0 lb

## 2016-08-13 DIAGNOSIS — Z79899 Other long term (current) drug therapy: Secondary | ICD-10-CM

## 2016-08-13 DIAGNOSIS — F17210 Nicotine dependence, cigarettes, uncomplicated: Secondary | ICD-10-CM

## 2016-08-13 DIAGNOSIS — N289 Disorder of kidney and ureter, unspecified: Secondary | ICD-10-CM | POA: Diagnosis not present

## 2016-08-13 DIAGNOSIS — D500 Iron deficiency anemia secondary to blood loss (chronic): Secondary | ICD-10-CM

## 2016-08-13 DIAGNOSIS — R911 Solitary pulmonary nodule: Secondary | ICD-10-CM

## 2016-08-13 NOTE — Telephone Encounter (Signed)
Call to Dr Marcellus Scott office per Dr Janese Banks request. Informed contact person Myriam Jacobson that pt came to [place] today voicing intermittent hopless feelings ,Denied suicidal ideation. Stated she used to see therapist Alonna Minium -this writer encouraged her to return to see therapist. Contracted to call [facility] or her Dr if feeling hopeless/suicidal. Ex husband w pt and supportive. Agreed w this plan

## 2016-08-13 NOTE — Progress Notes (Signed)
  Oncology Nurse Navigator Documentation  Navigator Location: [facility] (08/13/16 1100)   )Navigator Encounter Type: Clinic/MDC (08/13/16 1100)   Abnormal Finding Date: 08/03/16 (08/13/16 1100)           Multidisiplinary Clinic Date: 08/13/16 (08/13/16 1100) Multidisiplinary Clinic Type: Thoracic (08/13/16 1100)       Barriers/Navigation Needs: Coordination of Care (08/13/16 1100)   Interventions: Referrals;Coordination of Care (08/13/16 1100) Referrals: Other (cardiothoracic surgery) (08/13/16 1100) Coordination of Care: Appts (08/13/16 1100)        Acuity: Level 2 (08/13/16 1100)   Acuity Level 2: Assistance expediting appointments;Initial guidance, education and coordination as needed (08/13/16 1100)    Met with patient during visit with Dr. Janese Banks in the thoracic multidisciplinary clinic. All questions answered at time of visit. Assisted pt in expediting appointments to see Dr. Genevive Bi for surgical evaluation. Informed pt that she will be notified today with her appt to see Dr. Genevive Bi on Friday 5/25. Instructed pt to call with any further questions. Pt was encouraged to see Dr. Genevive Bi even if decides did not want to pursue surgery. Pt verbalized understanding.    Time Spent with Patient: 60 (08/13/16 1100)

## 2016-08-13 NOTE — Progress Notes (Signed)
Weepy talking about intermittently feeling hopeless. Denies suicidal ideation. Seperated recently from husband -here w her- and supportive. Stated she saw therapist Miguel Dibble in the past and doesn't think therapy helps. Agree to tell staff here at [place] if she wanted to see a therapist again or feeling hopeless. Dr Janese Banks informed

## 2016-08-13 NOTE — Progress Notes (Addendum)
Hematology/Oncology Consult note [facility]  [phone] [phone]  Patient Care Team: Jodi Marble, MD as PCP - General (Internal Medicine)   Name of the patient: Audrey Garrett  [id number]  [birth year]   Date of visit: 08/13/16  Diagnosis- 1. RUL lung nodule  2. Iron deficiency anemia  Chief complaint/ Reason for visit- discuss pet scan results  Heme/Onc history: Patient is a 56 year old female who was last seen by Dr. Alvy Bimler in jan 2018 for [place]. Patient has liver cirrhosis and is currently receiving Harvoni for hepatitis C. GI evaluation is as follows:  Upper EGD on 04/01/16 showed multiple dispersed diminutive erosions were found in the gastric antrum. There were no stigmata of recent bleeding. One healing cratered gastric ulcer with no stigmata of bleeding was found in the prepyloric region of the stomach. The lesion was 2 mm in largest dimension. Colonoscopy on 04/01/16 showed multiple small-mouthed diverticula were found in the sigmoid colon and descending colon. A single small localized angioectasia with typical arborization was found in the proximal ascending colon. Coagulation for tissue destruction using argon plasma at 0.5 liters/minute and 20 watts was successful.  She has received significant blood transfusions recently since March 2017. Her last blood transfusion was on 03/17/16. She had 2 units of blood She received 4 doses of IV Venofer  In Jan 2018 she received blood trasnsfusion followed by ferraheme.   During her visit in February 2018 her H&H was 10.3/ 31.6. IV iron was held off and she was supposed to get cbc checked in1 month. Patient missed that appointment and became progressively iron deficient again when her hb dropped to 7.9. She received 1 unit of blood transfusion  Patient continued to feel fatigued despite improvement in her anemia. Given her strong h/o smoking CT lung cancer screenign was obtained which  showed: IMPRESSION: 11.6 mm spiculated right lung nodule just anterior to the major fissure with associated fissure all retraction. Lung-RADS Category 4A, suspicious. Follow up low-dose chest CT without contrast in 3 months (please use the following order, "CT CHEST LCS NODULE FOLLOW-UP W/O CM") is recommended. Alternatively, PET may be considered when there is a solid component 24m or larger.  Other bilateral pulmonary nodules evident.  Attention on follow-up.   This was followed by PET/CT on 08/11/16 which showed: IMPRESSION: 1. The right upper lobe pulmonary nodule is only mildly metabolic, with an SUV of 1.8. Morphologically this lesion has a concerning appearance and I am still suspicious for the possibility of low-grade adenocarcinoma. 2. Sub solid nodules in both upper lobes and the smaller 7 by 6 mm solid nodule in the left upper lobe are not metabolic on today's exam but require surveillance for low-grade malignancy. 3. The patient has a known 1.7 cm enhancing mass of the right kidney upper pole. On today's PET-CT this seems to have a similar metabolic activity to the rest of the kidney. On balance the appearance is highly suspicious for renal cell carcinoma. No current findings of hypermetabolic metastatic adenopathy. 4. Cirrhosis and ascites. 5. A 7 by 6 cm loculated fluid collection along the right posterolateral margin of the uterus, could represent loculated ascites or a cystic ovarian lesion. No hypermetabolic activity associated with this lesion. 6. Cholelithiasis.   Interval history- still has fatigue. Feels emotional today and sometimes has feelings of hopelessness. She has been on anti depressant which has been helping but she continues to have personal stressors. Denies any suicidal or homicidal ideations  Review of systems- Review of Systems  Constitutional: Positive for malaise/fatigue. Negative for chills, fever and weight loss.  HENT: Negative for  congestion, ear discharge and nosebleeds.   Eyes: Negative for blurred vision.  Respiratory: Negative for cough, hemoptysis, sputum production, shortness of breath and wheezing.   Cardiovascular: Negative for chest pain, palpitations, orthopnea and claudication.  Gastrointestinal: Negative for abdominal pain, blood in stool, constipation, diarrhea, heartburn, melena, nausea and vomiting.  Genitourinary: Negative for dysuria, flank pain, frequency, hematuria and urgency.  Musculoskeletal: Negative for back pain, joint pain and myalgias.  Skin: Negative for rash.  Neurological: Negative for dizziness, tingling, focal weakness, seizures, weakness and headaches.  Endo/Heme/Allergies: Does not bruise/bleed easily.  Psychiatric/Behavioral: Positive for depression. Negative for suicidal ideas. The patient does not have insomnia.        No Known Allergies   Past Medical History:  Diagnosis Date  . Alcohol abuse   . Alcoholic cirrhosis of liver without ascites ([place])   . Anemia   . Anxiety   . Arthropathy   . Back pain   . Bronchitis   . BRONCHITIS, ACUTE 11/01/2009   Qualifier: Diagnosis of  By: Alveta Heimlich MD, Cornelia Copa    . Chronic hepatitis C ([place])   . Chronic LBP 12/25/2014   Overview:  Post extensive surgery   . Cirrhosis ([place])   . DDD (degenerative disc disease), lumbar 01/01/2015  . Depression   . Diabetes mellitus without complication ([place])   . Diverticulosis   . Edema   . Fatigue   . Fibromyalgia   . GERD (gastroesophageal reflux disease)    gastroparesis  . High risk medications (not anticoagulants) long-term use   . Hyperglycemia   . Hypertension   . Kidney mass    11/17 small mass-  . Left leg pain 01/01/2015  . Lumbago   . Lumbar radicular pain 01/01/2015  . Muscle spasm   . Neck pain 01/01/2015  . Polyarthritis   . Reflux   . Restless leg syndrome    01/2016  . RLS (restless legs syndrome)   . Sacroiliac pain 01/01/2015  . Shoulder pain   . Sinusitis   . SOB (shortness  of breath)   . Spine disorder   . Stress headaches   . Upper back pain 01/01/2015  . Urinary frequency   . Vitamin D deficiency disease      Past Surgical History:  Procedure Laterality Date  . BACK SURGERY  12/19/2011  . BACK SURGERY    . COLONOSCOPY WITH PROPOFOL N/A 09/25/2015   Procedure: COLONOSCOPY WITH PROPOFOL;  Surgeon: Lollie Sails, MD;  Location: [facility] ENDOSCOPY;  Service: Endoscopy;  Laterality: N/A;  . COLONOSCOPY WITH PROPOFOL N/A 04/01/2016   Procedure: COLONOSCOPY WITH PROPOFOL;  Surgeon: Lollie Sails, MD;  Location[facility] ENDOSCOPY;  Service: Endoscopy;  Laterality: N/A;  . ESOPHAGOGASTRODUODENOSCOPY N/A 04/14/2015   Procedure: ESOPHAGOGASTRODUODENOSCOPY (EGD);  Surgeon: Lollie Sails, MD;  Location: [facility] ENDOSCOPY;  Service: Endoscopy;  Laterality: N/A;  . ESOPHAGOGASTRODUODENOSCOPY (EGD) WITH PROPOFOL N/A 09/16/2015   Procedure: ESOPHAGOGASTRODUODENOSCOPY (EGD) WITH PROPOFOL;  Surgeon: Lollie Sails, MD;  Location: [facility] ENDOSCOPY;  Service: Endoscopy;  Laterality: N/A;  . ESOPHAGOGASTRODUODENOSCOPY (EGD) WITH PROPOFOL N/A 01/22/2016   Procedure: ESOPHAGOGASTRODUODENOSCOPY (EGD) WITH PROPOFOL;  Surgeon: [name], MD;  Location: [place];  Service: Endoscopy;  Laterality: N/A;  . ESOPHAGOGASTRODUODENOSCOPY (EGD) WITH PROPOFOL N/A 04/01/2016   Procedure: ESOPHAGOGASTRODUODENOSCOPY (EGD) WITH PROPOFOL;  Surgeon: Lollie Sails, MD;  Location: [facility] ENDOSCOPY;  Service: Endoscopy;  Laterality: N/A;  . HERNIA REPAIR N/A 10/2015   abdominal   . TONSILLECTOMY    . TUBAL LIGATION    . UMBILICAL HERNIA REPAIR N/A 11/06/2015   Procedure: HERNIA REPAIR UMBILICAL ADULT;  Surgeon: [name], MD;  Location: [facility] ORS;  Service: General;  Laterality: N/A;    Social History   Social History  . Marital status: Married    Spouse name: N/A  . Number of children: N/A  . Years of education: N/A   Occupational History  . Not on file.   Social History  Main Topics  . Smoking status: Current Every Day Smoker    Packs/day: 2.00    Years: 42.00    Types: Cigarettes  . Smokeless tobacco: Never Used     Comment: 2ppd until last 2 years  . Alcohol use No     Comment: stopped drinking 05/12/15  . Drug use: No  . Sexual activity: Not on file   Other Topics Concern  . Not on file   Social History Narrative   Lives at home with husband    Family History  Problem Relation Age of Onset  . Stroke Mother   . Heart disease Mother   . [name] Father   . Breast cancer Sister 52  . Kidney disease Neg Hx   . Bladder Cancer Neg Hx   . Kidney cancer Neg Hx   . Prostate cancer Neg Hx      Current Outpatient Prescriptions:  .  calcium carbonate (TUMS - DOSED IN MG ELEMENTAL CALCIUM) 500 MG chewable tablet, Chew 2 tablets by mouth 2 (two) times daily., Disp: , Rfl:  .  furosemide (LASIX) 40 MG tablet, Take by mouth 2 (two) times daily. , Disp: , Rfl:  .  lactulose (CHRONULAC) 10 GM/15ML solution, Take 10 g by mouth 3 (three) times daily. , Disp: , Rfl:  .  Magnesium Oxide 250 MG TABS, Take 1 tablet by mouth daily., Disp: , Rfl:  .  Multiple Vitamin (MULTIVITAMIN WITH MINERALS) TABS tablet, Take 1 tablet by mouth daily., Disp: , Rfl:  .  Oxycodone HCl 20 MG TABS, Take 1 tablet (20 mg total) by mouth every 6 (six) hours as needed., Disp: 120 tablet, Rfl: 0 .  pantoprazole (PROTONIX) 40 MG tablet, TAKE 1 TABLET (40 MG TOTAL) BY MOUTH 2 (TWO) TIMES DAILY., Disp: , Rfl: 11 .  PARoxetine (PAXIL) 40 MG tablet, Take 40 mg by mouth every morning., Disp: , Rfl: 2 .  phytonadione (VITAMIN K) 5 MG tablet, Take 5 mg by mouth 3 (three) times a week. Mon, Wed, Fri, Disp: , Rfl:  .  pregabalin (LYRICA) 75 MG capsule, Take 75 mg by mouth 2 (two) times daily., Disp: , Rfl:  .  spironolactone (ALDACTONE) 50 MG tablet, Take 100 mg by mouth daily. , Disp: , Rfl: 0 .  sucralfate (CARAFATE) 1 GM/10ML suspension, Take 1 g by mouth 3 (three) times daily as needed. ,  Disp: , Rfl:  .  thiamine (VITAMIN B-1) 100 MG tablet, Take 1 tablet (100 mg total) by mouth daily., Disp: 90 tablet, Rfl: 6 .  ciprofloxacin (CIPRO) 500 MG tablet, Take 500 mg by mouth 2 (two) times a week. Monday and friday, Disp: , Rfl:  .  Oxycodone HCl 20 MG TABS, Take 1 tablet (20 mg total) by mouth every 6 (six) hours., Disp: 120 tablet, Rfl: 0 .  Oxycodone HCl 20 MG TABS, Take 1 tablet (20 mg total)  by mouth every 6 (six) hours as needed., Disp: 120 tablet, Rfl: 0 .  traZODone (DESYREL) 50 MG tablet, Take 50 mg by mouth at bedtime., Disp: , Rfl:   Physical exam:  Vitals:   08/13/16 0922  BP: 112/76  Pulse: 76  Resp: 18  Temp: 97.7 F (36.5 C)  TempSrc: Tympanic  Weight: 155 lb (70.3 kg)   Physical Exam  Constitutional: She is oriented to person, place, and time and well-developed, well-nourished, and in no distress.  HENT:  Head: Normocephalic and atraumatic.  Eyes: EOM are normal. Pupils are equal, round, and reactive to light.  Neck: Normal range of motion.  Cardiovascular: Normal rate, regular rhythm and normal heart sounds.   Pulmonary/Chest: Effort normal and breath sounds normal.  Abdominal: Soft. She exhibits distension.  Neurological: She is alert and oriented to person, place, and time.  Skin: Skin is warm and dry.     CMP Latest Ref Rng & Units 04/12/2016  Glucose 65 - 99 mg/dL 101(H)  BUN 6 - 20 mg/dL 7  Creatinine 0.44 - 1.00 mg/dL 0.69  Sodium 135 - 145 mmol/L 130(L)  Potassium 3.5 - 5.1 mmol/L 3.4(L)  Chloride 101 - 111 mmol/L 97(L)  CO2 22 - 32 mmol/L 26  Calcium 8.9 - 10.3 mg/dL 9.3  Total Protein 6.5 - 8.1 g/dL 7.3  Total Bilirubin 0.3 - 1.2 mg/dL 0.7  Alkaline Phos 38 - 126 U/L 59  AST 15 - 41 U/L 20  ALT 14 - 54 U/L 9(L)   CBC Latest Ref Rng & Units 07/29/2016  WBC 3.6 - 11.0 K/uL 7.6  Hemoglobin 12.0 - 16.0 g/dL 12.6  Hematocrit 35.0 - 47.0 % 36.7  Platelets 150 - 440 K/uL 147(L)    No images are attached to the encounter.  Dg Si  Joints  Result Date: 07/16/2016 CLINICAL DATA:  Groin pain.  Prior back surgery. EXAM: BILATERAL SACROILIAC JOINTS - 3+ VIEW COMPARISON:  Right hip 07/12/2016. FINDINGS: Prior lumbosacral and bilateral sacroiliac fusion. Degenerative changes lumbar spine, both SI joints, both hips. No acute bony abnormality identified. No evidence of fracture. IMPRESSION: Postsurgical and degenerative change. No acute or focal abnormality. Electronically Signed   By: Marcello Moores  Register   On: 07/16/2016 14:24   Nm Pet Image Initial (pi) Skull Base To Thigh  Result Date: 08/11/2016 CLINICAL DATA:  Initial treatment strategy for pulmonary nodules. EXAM: NUCLEAR MEDICINE PET SKULL BASE TO THIGH TECHNIQUE: 12.4 mCi F-18 FDG was injected intravenously. Full-ring PET imaging was performed from the skull base to thigh after the radiotracer. CT data was obtained and used for attenuation correction and anatomic localization. FASTING BLOOD GLUCOSE:  Value: 138 mg/dl COMPARISON:  Chest CT from 08/03/2016 FINDINGS: NECK No hypermetabolic lymph nodes in the neck. CHEST 1.3 by 1.0 cm right upper lobe nodule on image 104/3 has a maximum SUV of 1.8. The ground-glass density nodules observed in both upper lobes and the 0.7 by 0.6 cm solid nodule in the left upper lobe on image 100/3 are not associated with appreciable metabolic activity. Aortic and branch vessel atherosclerotic vascular disease. ABDOMEN/PELVIS The right kidney upper pole renal mass seen on prior CT abdomen from 02/17/2016 has similar density and similar metabolic activity to the surrounding renal parenchymal tissue. The appearance on prior CT of 02/17/2016 was very suspicious for renal cell carcinoma. Cirrhosis and chronic ascites noted along with chronic low-level omental and mesenteric edema. Gallstones are present in the gallbladder. No abnormal hepatic, splenic, or pancreatic activity. No pathologic  or hypermetabolic retroperitoneal adenopathy. Physiologic activity in bowel.  Fluid density 7.6 by 5.5 cm structure along the right posterolateral margin of the uterus, possibly loculated ascites or an ovarian lesion, no associated hypermetabolic activity. Adjacent pelvic ascites noted. SKELETON Posterolateral rod and pedicle screw fixation of the entire lumbar spine and much of the thoracic spine. No significant abnormal hypermetabolic bony activity. IMPRESSION: 1. The right upper lobe pulmonary nodule is only mildly metabolic, with an SUV of 1.8. Morphologically this lesion has a concerning appearance and I am still suspicious for the possibility of low-grade adenocarcinoma. 2. Sub solid nodules in both upper lobes and the smaller 7 by 6 mm solid nodule in the left upper lobe are not metabolic on today's exam but require surveillance for low-grade malignancy. 3. The patient has a known 1.7 cm enhancing mass of the right kidney upper pole. On today's PET-CT this seems to have a similar metabolic activity to the rest of the kidney. On balance the appearance is highly suspicious for renal cell carcinoma. No current findings of hypermetabolic metastatic adenopathy. 4. Cirrhosis and ascites. 5. A 7 by 6 cm loculated fluid collection along the right posterolateral margin of the uterus, could represent loculated ascites or a cystic ovarian lesion. No hypermetabolic activity associated with this lesion. 6. Cholelithiasis. Electronically Signed   By: Van Clines M.D.   On: 08/11/2016 12:08   Ct Chest Lung Cancer Screening Low Dose Wo Contrast  Result Date: 08/03/2016 CLINICAL DATA:  56 year old female with 20 pack-year history of smoking. Lung cancer screening. EXAM: CT CHEST WITHOUT CONTRAST LOW-DOSE FOR LUNG CANCER SCREENING TECHNIQUE: Multidetector CT imaging of the chest was performed following the standard protocol without IV contrast. COMPARISON:  None. FINDINGS: Cardiovascular: The heart size is normal. No pericardial effusion. Atherosclerotic calcification is noted in the wall of  the thoracic aorta. Mediastinum/Nodes: No mediastinal lymphadenopathy. No evidence for gross hilar lymphadenopathy although assessment is limited by the lack of intravenous contrast on today's study. The esophagus has normal imaging features. There is no axillary lymphadenopathy. Lungs/Pleura: Emphysema noted with bronchial wall thickening. Bilateral pulmonary nodules are identified, measuring up to maximum volume derived mean diameter of 11.6 mm (image 153) in the posterior right upper or middle lobe (minor fissure not well demonstrated in this region on any of the imaging planes). With retraction of the major fissure. Upper Abdomen: Small volume intraperitoneal free fluid noted adjacent to the liver. Musculoskeletal: Thoracolumbar fusion hardware incompletely visualized. IMPRESSION: 11.6 mm spiculated right lung nodule just anterior to the major fissure with associated fissure all retraction. Lung-RADS Category 4A, suspicious. Follow up low-dose chest CT without contrast in 3 months (please use the following order, "CT CHEST LCS NODULE FOLLOW-UP W/O CM") is recommended. Alternatively, PET may be considered when there is a solid component 73m or larger. Other bilateral pulmonary nodules evident.  Attention on follow-up. These results will be called to the ordering clinician or representative by the Radiologist Assistant, and communication documented in the PACS or zVision Dashboard. Electronically Signed   By: EMisty StanleyM.D.   On: 08/03/2016 16:51   Dg Hip Unilat W Or W/o Pelvis 2-3 Views Left  Result Date: 07/16/2016 CLINICAL DATA:  Left hip/ groin pain. EXAM: DG HIP (WITH OR WITHOUT PELVIS) 2-3V LEFT COMPARISON:  CT abdomen and pelvis 02/17/2016 FINDINGS: Sequelae of extensive posterior spinal fusion are again identified extending to the sacrum and with bilateral iliac screws present as well. Left hip joint space width is preserved without significant degenerative changes  or erosion identified. Bone  mineralization appears normal. A moderate amount of colonic stool is partially visualized. IMPRESSION: Unremarkable appearance of the left hip. Electronically Signed   By: Logan Bores M.D.   On: 07/16/2016 14:24   Dg Hip Unilat W Or W/o Pelvis 2-3 Views Right  Result Date: 07/16/2016 CLINICAL DATA:  Left hip/ groin pain. EXAM: DG HIP (WITH OR WITHOUT PELVIS) 2-3V RIGHT COMPARISON:  CT abdomen and pelvis 02/17/2016 FINDINGS: Sequelae of extensive posterior spinal fusion are again identified extending to the sacrum and with bilateral iliac screws present as well. Right hip joint space width is preserved without significant degenerative changes or erosion identified. Bone mineralization appears normal. A moderate amount of colonic stool is partially visualized. IMPRESSION: Unremarkable appearance of the right hip. Electronically Signed   By: Logan Bores M.D.   On: 07/16/2016 14:25     Assessment and plan- Patient is a 56 y.o. female who sees me following medical issues  1. RUL lung nodule- discuss PET/CT findings with the patient in detail. She does have a right upper lobe lung mass with low SUV uptake which is still concerning for malignancy. There was no PET avid intrathoracic adenopathy noted. She does have good lung functions. We have discussed her case at the tumor Board yesterday and Dr. Genevive Bi from thoracic surgery would like to see her for possible resection of the nodule. Patient initially had her apprehensions about surgery.I explained to her that it would be worthwhile to meet Dr. Genevive Bi and decide mutually if that would be the way to move forward. If surgery is not pursued, she will be referred to pulmonary from bronchcoscopy guided biopsy  RCC seen on PET- being followed by Urology  2. IDA- will see her back in 6 weeks with cbc and iron studies  3. Depression- she has an appt to see her pcp next week and discuss this further   Visit Diagnosis 1. Lung nodule      Dr. Randa Evens, MD,  [facility] Pager- [phone] 08/13/2016 10:52 AM

## 2016-08-16 ENCOUNTER — Ambulatory Visit: Payer: Medicare Other

## 2016-08-16 ENCOUNTER — Telehealth: Payer: Self-pay | Admitting: Urology

## 2016-08-16 NOTE — Telephone Encounter (Signed)
That's fine.  She had one recently we can use.    Hollice Espy, MD

## 2016-08-16 NOTE — Telephone Encounter (Signed)
Patient said that she has had to much radiation done in the last few months and she is not going to have another CT scan done. You have ordered a CT scan for her to have done prior to her app with you on the 13th and she said she is not going to do it.  Audrey Garrett

## 2016-08-17 ENCOUNTER — Ambulatory Visit: Payer: 59 | Admitting: Pain Medicine

## 2016-08-19 ENCOUNTER — Ambulatory Visit: Payer: Medicare Other | Attending: Pain Medicine | Admitting: Pain Medicine

## 2016-08-19 ENCOUNTER — Other Ambulatory Visit: Payer: Self-pay | Admitting: *Deleted

## 2016-08-19 ENCOUNTER — Ambulatory Visit (INDEPENDENT_AMBULATORY_CARE_PROVIDER_SITE_OTHER): Payer: Medicare Other | Admitting: Cardiothoracic Surgery

## 2016-08-19 ENCOUNTER — Encounter: Payer: Self-pay | Admitting: Cardiothoracic Surgery

## 2016-08-19 VITALS — BP 109/70 | HR 68 | Temp 98.5°F | Resp 16 | Ht 69.0 in | Wt 159.0 lb

## 2016-08-19 VITALS — BP 111/70 | HR 76 | Temp 98.3°F | Ht 70.0 in | Wt 159.6 lb

## 2016-08-19 DIAGNOSIS — I10 Essential (primary) hypertension: Secondary | ICD-10-CM | POA: Diagnosis not present

## 2016-08-19 DIAGNOSIS — R1032 Left lower quadrant pain: Secondary | ICD-10-CM | POA: Diagnosis not present

## 2016-08-19 DIAGNOSIS — E1165 Type 2 diabetes mellitus with hyperglycemia: Secondary | ICD-10-CM | POA: Diagnosis not present

## 2016-08-19 DIAGNOSIS — M5442 Lumbago with sciatica, left side: Secondary | ICD-10-CM

## 2016-08-19 DIAGNOSIS — F1190 Opioid use, unspecified, uncomplicated: Secondary | ICD-10-CM | POA: Diagnosis not present

## 2016-08-19 DIAGNOSIS — G894 Chronic pain syndrome: Secondary | ICD-10-CM | POA: Insufficient documentation

## 2016-08-19 DIAGNOSIS — M961 Postlaminectomy syndrome, not elsewhere classified: Secondary | ICD-10-CM | POA: Diagnosis not present

## 2016-08-19 DIAGNOSIS — F339 Major depressive disorder, recurrent, unspecified: Secondary | ICD-10-CM | POA: Insufficient documentation

## 2016-08-19 DIAGNOSIS — M533 Sacrococcygeal disorders, not elsewhere classified: Secondary | ICD-10-CM | POA: Diagnosis not present

## 2016-08-19 DIAGNOSIS — Z87891 Personal history of nicotine dependence: Secondary | ICD-10-CM | POA: Diagnosis not present

## 2016-08-19 DIAGNOSIS — Z79891 Long term (current) use of opiate analgesic: Secondary | ICD-10-CM

## 2016-08-19 DIAGNOSIS — M545 Low back pain: Secondary | ICD-10-CM | POA: Insufficient documentation

## 2016-08-19 DIAGNOSIS — G8929 Other chronic pain: Secondary | ICD-10-CM | POA: Diagnosis not present

## 2016-08-19 DIAGNOSIS — Z9889 Other specified postprocedural states: Secondary | ICD-10-CM | POA: Insufficient documentation

## 2016-08-19 DIAGNOSIS — M47896 Other spondylosis, lumbar region: Secondary | ICD-10-CM | POA: Insufficient documentation

## 2016-08-19 DIAGNOSIS — Z79899 Other long term (current) drug therapy: Secondary | ICD-10-CM | POA: Insufficient documentation

## 2016-08-19 DIAGNOSIS — M4696 Unspecified inflammatory spondylopathy, lumbar region: Secondary | ICD-10-CM

## 2016-08-19 DIAGNOSIS — R918 Other nonspecific abnormal finding of lung field: Secondary | ICD-10-CM

## 2016-08-19 DIAGNOSIS — M47816 Spondylosis without myelopathy or radiculopathy, lumbar region: Secondary | ICD-10-CM

## 2016-08-19 MED ORDER — OXYCODONE HCL 20 MG PO TABS: 1.0000 | ORAL_TABLET | Freq: Four times a day (QID) | ORAL | 0 refills | Status: DC | PRN

## 2016-08-19 MED ORDER — OXYCODONE HCL 20 MG PO TABS: 1.0000 | ORAL_TABLET | Freq: Four times a day (QID) | ORAL | 0 refills | Status: DC

## 2016-08-19 NOTE — Patient Instructions (Addendum)
____________________________________________________________________________________________  Preparing for Procedure with Sedation Instructions: . Oral Intake: Do not eat or drink anything for at least 8 hours prior to your procedure. . Transportation: Public transportation is not allowed. Bring an adult driver. The driver must be physically present in our waiting room before any procedure can be started. Marland Kitchen Physical Assistance: Bring an adult physically capable of assisting you, in the event you need help. This adult should keep you company at home for at least 6 hours after the procedure. . Blood Pressure Medicine: Take your blood pressure medicine with a sip of water the morning of the procedure. . Blood thinners:  . Diabetics on insulin: Notify the staff so that you can be scheduled 1st case in the morning. If your diabetes requires high dose insulin, take only  of your normal insulin dose the morning of the procedure and notify the staff that you have done so. . Preventing infections: Shower with an antibacterial soap the morning of your procedure. . Build-up your immune system: Take 1000 mg of Vitamin C with every meal (3 times a day) the day prior to your procedure. Marland Kitchen Antibiotics: Inform the staff if you have a condition or reason that requires you to take antibiotics before dental procedures. . Pregnancy: If you are pregnant, call and cancel the procedure. . Sickness: If you have a cold, fever, or any active infections, call and cancel the procedure. . Arrival: You must be in the facility at least 30 minutes prior to your scheduled procedure. . Children: Do not bring children with you. . Dress appropriately: Bring dark clothing that you would not mind if they get stained. . Valuables: Do not bring any jewelry or valuables. Procedure appointments are reserved for interventional treatments only. Marland Kitchen No Prescription Refills. . No medication changes will be discussed during procedure  appointments. . No disability issues will be discussed. ______________________________________________________________________________________________  ____________________________________________________________________________________________  Appointment Policy  It is our goal and responsibility to provide the medical community with assistance in the evaluation and management of patients with chronic pain. Unfortunately our resources are limited. Because we do not have an unlimited amount of time, or available appointments, we are required to closely monitor and manage their use. The following rules exist to maximize their use:  [name] responsibilities: 1. Punctuality: You are required to be physically present and registered in our facility at least 30 minutes before your appointment. 2. Tardiness: The cutoff is your appointment time. If you have an appointment scheduled for 10:00 AM and you arrive at 10:01, you will be required to reschedule your appointment.  3. Plan ahead: Always assume that you will encounter traffic on your way in. Plan for it. If you are dependent on a driver, make sure they understand these rules and the need to arrive early. 4. Other appointments and responsibilities: Avoid scheduling any other appointments before or after your pain clinic appointments.  5. Be prepared: Write down everything that you need to discuss with your healthcare provider and give this information to the admitting nurse. Write down the medications that you will need refilled. Bring your pills and bottles (even the empty ones), to all of your appointments, except for those where a procedure is scheduled. 6. No children or pets: Find someone to take care of them. It is not appropriate to bring them in. 7. Scheduling changes: We request "advanced notification" of any changes or cancellations. 8. Advanced notification: Defined as a time period of more than 24 hours prior to the originally scheduled  appointment. This allows for the appointment to be offered to other patients. 9. Rescheduling: When a visit is rescheduled, it will require the cancellation of the original appointment. For this reason they both fall within the category of "Cancellations".  10. Cancellations: They require advanced notification. Any cancellation less than 24 hours before the  appointment will be recorded as a "No Show". 11. No Show: Defined as an unkept appointment where the patient failed to notify or declare to the practice their intention or inability to keep the appointment.  Corrective process for repeat offenders:  1. Tardiness: Three (3) episodes of rescheduling due to late arrivals will be recorded as one (1) "No Show". 2. Cancellation or reschedule: Three (3) cancellations or rescheduling will be recorded as one (1) "No Show". 3. "No Shows": Three (3) "No Shows" within a 12 month period will result in discharge from the practice.  ____________________________________________________________________________________________  ____________________________________________________________________________________________  Medication Rules  Applies to: All patients receiving prescriptions (written or electronic).  Pharmacy of record: Pharmacy where electronic prescriptions will be sent. If written prescriptions are taken to a different pharmacy, please inform the nursing staff. The pharmacy listed in the electronic medical record should be the one where you would like electronic prescriptions to be sent.  Prescription refills: Only during scheduled appointments. Applies to both, written and electronic prescriptions.  NOTE: The following applies primarily to controlled substances (Opioid Pain Medications)  [name] responsibilities: 12. Pain Pills: Bring all pain pills to every appointment (except for procedure appointments). 13. Pill Bottles: Bring pills in original pharmacy bottle. Always bring newest  bottle. Bring bottle, even if empty. 14. Medication refills: You are responsible for knowing and keeping track of what medications you need refilled. The day before your appointment, write a list of all prescriptions that need to be refilled. Bring that list to your appointment and give it to the admitting nurse. Prescriptions will be written only during appointments. If you forget a medication, it will not be "Called in", "Faxed", or "electronically sent". You will need to get another appointment to get these prescribed. 15. Prescription Accuracy: You are responsible for carefully inspecting your prescriptions before leaving our office. Have the discharge nurse carefully go over each prescription with you, before taking them home. Make sure that your name is accurately spelled, that your address is correct. Check the name and dose of your medication to make sure it is accurate. Check the number of pills, and the written instructions to make sure they are clear and accurate. Make sure that you are given enough medication to last until your next medication refill appointment. 16. Taking Medication: Take medication as prescribed. Never take more pills than instructed. Never take medication more frequently than prescribed. Taking less pills or less frequently is permitted and encouraged, when it comes to controlled substances (written prescriptions).  11. Inform other Doctors: Always inform, all of your healthcare providers, of all the medications you take. 18. Pain Medication from other Providers: You are not allowed to accept any additional pain medication from any other Doctor or Healthcare provider. There are two exceptions to this rule. (see below) In the event that you require additional pain medication, you are responsible for notifying us, as stated below. 19. Medication Agreement: You are responsible for carefully reading and following our Medication Agreement. This must be signed before receiving any  prescriptions from our practice. Safely store a copy of your signed Agreement. Violations to the Agreement will result in no further prescriptions. (Additional copies of our  Medication Agreement are available upon request.) 20. Laws, Rules, & Regulations: All patients are expected to follow all Federal and [employer], Rules, [employer]. Ignorance of the Laws does not constitute a valid excuse.  Exceptions: There are only two exceptions to the rule of not receiving pain medications from other Healthcare Providers. 4. Exception #1 (Emergencies): In the event of an emergency (i.e.: accident requiring emergency care), you are allowed to receive additional pain medication. However, you are responsible for: As soon as you are able, call our office [phone], at any time of the day or night, and leave a message stating your name, the date and nature of the emergency, and the name and dose of the medication prescribed. In the event that your call is answered by a member of our staff, make sure to document and save the date, time, and the name of the person that took your information.  5. Exception #2 (Planned Surgery): In the event that you are scheduled by another doctor or dentist to have any type of surgery or procedure, you are allowed (for a period no longer than 30 days), to receive additional pain medication, for the acute post-op pain. However, in this case, you are responsible for picking up a copy of our "Post-op Pain Management for Surgeons" handout, and giving it to your surgeon or dentist. This document is available at our office, and does not require an appointment to obtain it. Simply go to our office during business hours (Monday-Thursday from 8:00 AM to 4:00 PM) (Friday 8:00 AM to 12:00 Noon) or if you have a scheduled appointment with [place], prior to your surgery, and ask for it by name. In addition, you will need to provide [place] with your name, name of your surgeon, type of surgery, and  date of procedure or surgery.  _____________________________________________________________________________________________  ____________________________________________________________________________________________  Pain Scale  Introduction: The pain score used by this practice is the Verbal Numerical Rating Scale (VNRS-11). This is an 11-point scale. It is for adults and children 10 years or older. There are significant differences in how the pain score is reported, used, and applied. Forget everything you learned in the past and learn this scoring system.  General Information: The scale should reflect your current level of pain. Unless you are specifically asked for the level of your worst pain, or your average pain. If you are asked for one of these two, then it should be understood that it is over the past 24 hours.  Basic Activities of Daily Living (ADL): Personal hygiene, dressing, eating, transferring, and using restroom.  Instructions: Most patients tend to report their level of pain as a combination of two factors, their physical pain and their psychosocial pain. This last one is also known as "suffering" and it is reflection of how physical pain affects you socially and psychologically. From now on, report them separately. From this point on, when asked to report your pain level, report only your physical pain. Use the following table for reference.  Pain Clinic Pain Levels (0-5/10)  Pain Level Score  Description  No Pain 0   Mild pain 1 Nagging, annoying, but does not interfere with basic activities of daily living (ADL). Patients are able to eat, bathe, get dressed, toileting (being able to get on and off the toilet and perform personal hygiene functions), transfer (move in and out of bed or a chair without assistance), and maintain continence (able to control bladder and bowel functions). Blood pressure and heart rate  are unaffected. A normal heart rate for a healthy adult ranges  from 60 to 100 bpm (beats per minute).   Mild to moderate pain 2 Noticeable and distracting. Impossible to hide from other people. More frequent flare-ups. Still possible to adapt and function close to normal. It can be very annoying and may have occasional stronger flare-ups. With discipline, patients may get used to it and adapt.   Moderate pain 3 Interferes significantly with activities of daily living (ADL). It becomes difficult to feed, bathe, get dressed, get on and off the toilet or to perform personal hygiene functions. Difficult to get in and out of bed or a chair without assistance. Very distracting. With effort, it can be ignored when deeply involved in activities.   Moderately severe pain 4 Impossible to ignore for more than a few minutes. With effort, patients may still be able to manage work or participate in some social activities. Very difficult to concentrate. Signs of autonomic nervous system discharge are evident: dilated pupils (mydriasis); mild sweating (diaphoresis); sleep interference. Heart rate becomes elevated (>115 bpm). Diastolic blood pressure (lower number) rises above 100 mmHg. Patients find relief in laying down and not moving.   Severe pain 5 Intense and extremely unpleasant. Associated with frowning face and frequent crying. Pain overwhelms the senses.  Ability to do any activity or maintain social relationships becomes significantly limited. Conversation becomes difficult. Pacing back and forth is common, as getting into a comfortable position is nearly impossible. Pain wakes you up from deep sleep. Physical signs will be obvious: pupillary dilation; increased sweating; goosebumps; brisk reflexes; cold, clammy hands and feet; nausea, vomiting or dry heaves; loss of appetite; significant sleep disturbance with inability to fall asleep or to remain asleep. When persistent, significant weight loss is observed due to the complete loss of appetite and sleep deprivation.  Blood  pressure and heart rate becomes significantly elevated. Caution: If elevated blood pressure triggers a pounding headache associated with blurred vision, then the patient should immediately seek attention at an urgent or emergency care unit, as these may be signs of an impending stroke.    Emergency Department Pain Levels (6-10/10)  Emergency Room Pain 6 Severely limiting. Requires emergency care and should not be seen or managed at an outpatient pain management facility. Communication becomes difficult and requires great effort. Assistance to reach the emergency department may be required. Facial flushing and profuse sweating along with potentially dangerous increases in heart rate and blood pressure will be evident.   Distressing pain 7 Self-care is very difficult. Assistance is required to transport, or use restroom. Assistance to reach the emergency department will be required. Tasks requiring coordination, such as bathing and getting dressed become very difficult.   Disabling pain 8 Self-care is no longer possible. At this level, pain is disabling. The individual is unable to do even the most "basic" activities such as walking, eating, bathing, dressing, transferring to a bed, or toileting. Fine motor skills are lost. It is difficult to think clearly.   Incapacitating pain 9 Pain becomes incapacitating. Thought processing is no longer possible. Difficult to remember your own name. Control of movement and coordination are lost.   The worst pain imaginable 10 At this level, most patients pass out from pain. When this level is reached, collapse of the autonomic nervous system occurs, leading to a sudden drop in blood pressure and heart rate. This in turn results in a temporary and dramatic drop in blood flow to the brain, leading to a  loss of consciousness. Fainting is one of the body's self defense mechanisms. Passing out puts the brain in a calmed state and causes it to shut down for a while, in  order to begin the healing process.    Summary: 1. Refer to this scale when providing [place] with your pain level. 2. Be accurate and careful when reporting your pain level. This will help with your care. 3. Over-reporting your pain level will lead to loss of credibility. 4. Even a level of 1/10 means that there is pain and will be treated at our facility. 5. High, inaccurate reporting will be documented as "Symptom Exaggeration", leading to loss of credibility and suspicions of possible secondary gains such as obtaining more narcotics, or wanting to appear disabled, for fraudulent reasons. 6. Only pain levels of 5 or below will be seen at our facility. 7. Pain levels of 6 and above will be sent to the Emergency Department and the appointment cancelled. _____________________________________________________________________________________________  GENERAL RISKS AND COMPLICATIONS  What are the risk, side effects and possible complications? Generally speaking, most procedures are safe.  However, with any procedure there are risks, side effects, and the possibility of complications.  The risks and complications are dependent upon the sites that are lesioned, or the type of nerve block to be performed.  The closer the procedure is to the spine, the more serious the risks are.  Great care is taken when placing the radio frequency needles, block needles or lesioning probes, but sometimes complications can occur. 1. Infection: Any time there is an injection through the skin, there is a risk of infection.  This is why sterile conditions are used for these blocks.  There are four possible types of infection. 1. Localized skin infection. 2. Central Nervous System Infection-This can be in the form of Meningitis, which can be deadly. 3. Epidural Infections-This can be in the form of an epidural abscess, which can cause pressure inside of the spine, causing compression of the spinal cord with subsequent paralysis.  This would require an emergency surgery to decompress, and there are no guarantees that the patient would recover from the paralysis. 4. Discitis-This is an infection of the intervertebral discs.  It occurs in about 1% of discography procedures.  It is difficult to treat and it may lead to surgery.        2. Pain: the needles have to go through skin and soft tissues, will cause soreness.       3. Damage to internal structures:  The nerves to be lesioned may be near blood vessels or    other nerves which can be potentially damaged.       4. Bleeding: Bleeding is more common if the patient is taking blood thinners such as  aspirin, Coumadin, Ticiid, Plavix, etc., or if he/she have some genetic predisposition  such as hemophilia. Bleeding into the spinal canal can cause compression of the spinal  cord with subsequent paralysis.  This would require an emergency surgery to  decompress and there are no guarantees that the patient would recover from the  paralysis.       5. Pneumothorax:  Puncturing of a lung is a possibility, every time a needle is introduced in  the area of the chest or upper back.  Pneumothorax refers to free air around the  collapsed lung(s), inside of the thoracic cavity (chest cavity).  Another two possible  complications related to a similar event would include: Hemothorax and Chylothorax.   These are  variations of the Pneumothorax, where instead of air around the collapsed  lung(s), you may have blood or chyle, respectively.       6. Spinal headaches: They may occur with any procedures in the area of the spine.       7. Persistent CSF (Cerebro-Spinal Fluid) leakage: This is a rare problem, but may occur  with prolonged intrathecal or epidural catheters either due to the formation of a fistulous  track or a dural tear.       8. Nerve damage: By working so close to the spinal cord, there is always a possibility of  nerve damage, which could be as serious as a permanent spinal cord injury  with  paralysis.       9. Death:  Although rare, severe deadly allergic reactions known as "Anaphylactic  reaction" can occur to any of the medications used.      10. Worsening of the symptoms:  We can always make thing worse.  What are the chances of something like this happening? Chances of any of this occuring are extremely low.  By statistics, you have more of a chance of getting killed in a motor vehicle accident: while driving to the hospital than any of the above occurring .  Nevertheless, you should be aware that they are possibilities.  In general, it is similar to taking a shower.  Everybody knows that you can slip, hit your head and get killed.  Does that mean that you should not shower again?  Nevertheless always keep in mind that statistics do not mean anything if you happen to be on the wrong side of them.  Even if a procedure has a 1 (one) in a 1,000,000 (million) chance of going wrong, it you happen to be that one..Also, keep in mind that by statistics, you have more of a chance of having something go wrong when taking medications.  Who should not have this procedure? If you are on a blood thinning medication (e.g. Coumadin, Plavix, see list of "Blood Thinners"), or if you have an active infection going on, you should not have the procedure.  If you are taking any blood thinners, please inform your physician.  How should I prepare for this procedure?  Do not eat or drink anything at least six hours prior to the procedure.  Bring a driver with you .  It cannot be a taxi.  Come accompanied by an adult that can drive you back, and that is strong enough to help you if your legs get weak or numb from the local anesthetic.  Take all of your medicines the morning of the procedure with just enough water to swallow them.  If you have diabetes, make sure that you are scheduled to have your procedure done first thing in the morning, whenever possible.  If you have diabetes, take only half  of your insulin dose and notify our nurse that you have done so as soon as you arrive at the clinic.  If you are diabetic, but only take blood sugar pills (oral hypoglycemic), then do not take them on the morning of your procedure.  You may take them after you have had the procedure.  Do not take aspirin or any aspirin-containing medications, at least eleven (11) days prior to the procedure.  They may prolong bleeding.  Wear loose fitting clothing that may be easy to take off and that you would not mind if it got stained with Betadine or blood.  Do not  wear any jewelry or perfume  Remove any nail coloring.  It will interfere with some of our monitoring equipment.  NOTE: Remember that this is not meant to be interpreted as a complete list of all possible complications.  Unforeseen problems may occur.  BLOOD THINNERS The following drugs contain aspirin or other products, which can cause increased bleeding during surgery and should not be taken for 2 weeks prior to and 1 week after surgery.  If you should need take something for relief of minor pain, you may take acetaminophen which is found in Tylenol,m Datril, Anacin-3 and Panadol. It is not blood thinner. The products listed below are.  Do not take any of the products listed below in addition to any listed on your instruction sheet.  A.P.C or A.P.C with Codeine Codeine Phosphate Capsules #3 Ibuprofen Ridaura  ABC compound Congesprin Imuran rimadil  Advil Cope Indocin Robaxisal  Alka-Seltzer Effervescent Pain Reliever and Antacid Coricidin or Coricidin-D  Indomethacin Rufen  Alka-Seltzer plus Cold Medicine Cosprin Ketoprofen S-A-C Tablets  Anacin Analgesic Tablets or Capsules Coumadin Korlgesic Salflex  Anacin Extra Strength Analgesic tablets or capsules CP-2 Tablets Lanoril Salicylate  Anaprox Cuprimine Capsules Levenox Salocol  Anexsia-D Dalteparin Magan Salsalate  Anodynos Darvon compound Magnesium Salicylate Sine-off  Ansaid Dasin  Capsules Magsal Sodium Salicylate  Anturane Depen Capsules Marnal Soma  APF Arthritis pain formula Dewitt's Pills Measurin Stanback  Argesic Dia-Gesic Meclofenamic Sulfinpyrazone  Arthritis Bayer Timed Release Aspirin Diclofenac Meclomen Sulindac  Arthritis pain formula Anacin Dicumarol Medipren Supac  Analgesic (Safety coated) Arthralgen Diffunasal Mefanamic Suprofen  Arthritis Strength Bufferin Dihydrocodeine Mepro Compound Suprol  Arthropan liquid Dopirydamole Methcarbomol with Aspirin Synalgos  ASA tablets/Enseals Disalcid Micrainin Tagament  Ascriptin Doan's Midol Talwin  Ascriptin A/D Dolene Mobidin Tanderil  Ascriptin Extra Strength Dolobid Moblgesic Ticlid  Ascriptin with Codeine Doloprin or Doloprin with Codeine Momentum Tolectin  Asperbuf Duoprin Mono-gesic Trendar  Aspergum Duradyne Motrin or Motrin IB Triminicin  Aspirin plain, buffered or enteric coated Durasal Myochrisine Trigesic  Aspirin Suppositories Easprin Nalfon Trillsate  Aspirin with Codeine Ecotrin Regular or Extra Strength Naprosyn Uracel  Atromid-S Efficin Naproxen Ursinus  Auranofin Capsules Elmiron Neocylate Vanquish  Axotal Emagrin Norgesic Verin  Azathioprine Empirin or Empirin with Codeine Normiflo Vitamin E  Azolid Emprazil Nuprin Voltaren  Bayer Aspirin plain, buffered or children's or timed BC Tablets or powders Encaprin Orgaran Warfarin Sodium  Buff-a-Comp Enoxaparin Orudis Zorpin  Buff-a-Comp with Codeine Equegesic Os-Cal-Gesic   Buffaprin Excedrin plain, buffered or Extra Strength Oxalid   Bufferin Arthritis Strength Feldene Oxphenbutazone   Bufferin plain or Extra Strength Feldene Capsules Oxycodone with Aspirin   Bufferin with Codeine Fenoprofen Fenoprofen Pabalate or Pabalate-SF   Buffets II Flogesic Panagesic   Buffinol plain or Extra Strength Florinal or Florinal with Codeine Panwarfarin   Buf-Tabs Flurbiprofen Penicillamine   Butalbital Compound Four-way cold tablets Penicillin    Butazolidin Fragmin Pepto-Bismol   Carbenicillin Geminisyn Percodan   Carna Arthritis Reliever Geopen Persantine   Carprofen Gold's salt Persistin   Chloramphenicol Goody's Phenylbutazone   Chloromycetin Haltrain Piroxlcam   Clmetidine heparin Plaquenil   Cllnoril Hyco-pap Ponstel   Clofibrate Hydroxy chloroquine Propoxyphen         Before stopping any of these medications, be sure to consult the physician who ordered them.  Some, such as Coumadin (Warfarin) are ordered to prevent or treat serious conditions such as "deep thrombosis", "pumonary embolisms", and other heart problems.  The amount of time that you may need off of the medication may  also vary with the medication and the reason for which you were taking it.  If you are taking any of these medications, please make sure you notify your pain physician before you undergo any procedures.          Facet Joint Block The facet joints connect the bones of the spine (vertebrae). They make it possible for you to bend, twist, and make other movements with your spine. They also keep you from bending too far, twisting too far, and making other excessive movements. A facet joint block is a procedure where a numbing medicine (anesthetic) is injected into a facet joint. Often, a type of anti-inflammatory medicine called a steroid is also injected. A facet joint block may be done to diagnose neck or back pain. If the pain gets better after a facet joint block, it means the pain is probably coming from the facet joint. If the pain does not get better, it means the pain is probably not coming from the facet joint. A facet joint block may also be done to relieve neck or back pain caused by an inflamed facet joint. A facet joint block is only done to relieve pain if the pain does not improve with other methods, such as medicine, exercise programs, and physical therapy. Tell a health care provider about:  Any allergies you have.  All medicines you  are taking, including vitamins, herbs, eye drops, creams, and over-the-counter medicines.  Any problems you or family members have had with anesthetic medicines.  Any blood disorders you have.  Any surgeries you have had.  Any medical conditions you have.  Whether you are pregnant or may be pregnant. What are the risks? Generally, this is a safe procedure. However, problems may occur, including:  Bleeding.  Injury to a nerve near the injection site.  Pain at the injection site.  Weakness or numbness in areas controlled by nerves near the injection site.  Infection.  Temporary fluid retention.  Allergic reactions to medicines or dyes.  Injury to other structures or organs near the injection site. What happens before the procedure?  Follow instructions from your health care provider about eating or drinking restrictions.  Ask your health care provider about:  Changing or stopping your regular medicines. This is especially important if you are taking diabetes medicines or blood thinners.  Taking medicines such as aspirin and ibuprofen. These medicines can thin your blood. Do not take these medicines before your procedure if your health care provider instructs you not to.  Do not take any new dietary supplements or medicines without asking your health care provider first.  Plan to have someone take you home after the procedure. What happens during the procedure?  You may need to remove your clothing and dress in an open-back gown.  The procedure will be done while you are lying on an X-ray table. You will most likely be asked to lie on your stomach, but you may be asked to lie in a different position if an injection will be made in your neck.  Machines will be used to monitor your oxygen levels, heart rate, and blood pressure.  If an injection will be made in your neck, an IV tube will be inserted into one of your veins. Fluids and medicine will flow directly into your  body through the IV tube.  The area over the facet joint where the injection will be made will be cleaned with soap. The surrounding skin will be covered with clean drapes.  A numbing medicine (local anesthetic) will be applied to your skin. Your skin may sting or burn for a moment.  A video X-ray machine (fluoroscopy) will be used to locate the joint. In some cases, a CT scan may be used.  A contrast dye may be injected into the facet joint area to help locate the joint.  When the joint is located, an anesthetic will be injected into the joint through the needle.  Your health care provider will ask you whether you feel pain relief. If you do feel relief, a steroid may be injected to provide pain relief for a longer period of time. If you do not feel relief or feel only partial relief, additional injections of an anesthetic may be made in other facet joints.  The needle will be removed.  Your skin will be cleaned.  A bandage (dressing) will be applied over each injection site. The procedure may vary among health care providers and hospitals. What happens after the procedure?  You will be observed for 15-30 minutes before being allowed to go home. This information is not intended to replace advice given to you by your health care provider. Make sure you discuss any questions you have with your health care provider. Document Released: 08/04/2006 Document Revised: 04/16/2015 Document Reviewed: 12/09/2014 [employer] Interactive Patient Education  2017 [employer].

## 2016-08-19 NOTE — Progress Notes (Signed)
[name] Name: Audrey Garrett  MRN: [id number]  Referring Provider: Jodi Marble, MD  DOB: [birth year]  PCP: Jodi Marble, MD  DOS: 08/19/2016  Note by: Kathlen Brunswick. Dossie Arbour, MD  Service setting: Ambulatory outpatient  Specialty: Interventional Pain Management  Location: [facility] (AMB) Pain Management Facility    Patient type: Established   Primary Reason(s) for Visit: Encounter for prescription drug management (Level of risk: moderate) CC: Back Pain (lower) and Arm Pain (both upper arm)  HPI  Audrey Garrett is a 56 y.o. year old, female patient, who comes today for a medication management evaluation. She has Insomnia, persistent; BP (high blood pressure); Cervical radicular pain; Uncomplicated opioid dependence ([place]); Long term prescription opiate use; Failed back surgical syndrome; Lumbar facet syndrome (Bilateral) (L>R); Osteoarthritis of spine with radiculopathy, lumbar region; Pain of paraspinal muscle; Chronic low back pain (Location of Primary Source of Pain) (Bilateral) (L>R); HCV (hepatitis C virus); At risk for osteopenia; Lumbar spondylosis; Chronic lumbar radicular pain (Location of Secondary source of pain) (Left); Chronic neck pain; Chronic lower extremity pain (Left); Chronic sacroiliac joint pain (B) (R>L); Chronic upper back pain; Long term current use of opiate analgesic; Encounter for chronic pain management; Hypomagnesemia (low magnesium levels); Hypotension; Anemia; Thrombocytopenia ([place]); Coagulopathy ([place]); Hyperglycemia; Polyneuropathy; Urinary retention; Difficulty urinating; Nephrolithiasis; Alcoholic cirrhosis of liver with ascites ([place]); Depression, major, recurrent, moderate ([place]); Abdominal pain, chronic, epigastric; Hepatic cirrhosis ([place]); Acute GI bleeding; Neurogenic pain; S/P thoracolumbar fusion (T5-L5); Grade 1 Retrolisthesis (4 mm) of C5 over C6 and C6 over C7; Cervical foraminal stenosis (Left C5-6; Bilateral C6-7); Hernia of anterior abdominal wall; Incarcerated  umbilical hernia; Controlled type 2 diabetes mellitus with hyperglycemia ([place]); Acute blood loss anemia; Chronic pain syndrome; Malnutrition of moderate degree; Iron deficiency anemia due to chronic blood loss; Opiate use; RLS (restless legs syndrome); Bilateral leg edema; Depression, major, in remission ([place]); Chronic hip pain (Bilateral); Osteoarthritis of hip (Bilateral); Personal history of tobacco use, presenting hazards to health; and Chronic groin pain, left on her problem list. Her primarily concern today is the Back Pain (lower) and Arm Pain (both upper arm)  Pain Assessment: Self-Reported Pain Score: 4 /10 Clinically the patient looks like a 3/10 Reported level is inconsistent with clinical observations. Information on the proper use of the pain scale provided to the patient today Pain Type: Chronic pain Pain Location: Back Pain Orientation: Lower Pain Descriptors / Indicators: Aching, Radiating Pain Frequency: Intermittent  Audrey Garrett was last scheduled for an appointment on 08/04/2016 for medication management. During today's appointment we reviewed Audrey Garrett's chronic pain status, as well as her outpatient medication regimen.  The patient  reports that she does not use drugs. Her body mass index is 23.48 kg/m.  Further details on both, my assessment(s), as well as the proposed treatment plan, please see below.  Controlled Substance Pharmacotherapy Assessment REMS (Risk Evaluation and Mitigation Strategy)  Analgesic:Oxycodone IR 20 mg every 6 (80 mg/day) MME/day:120 mg/day. Lona Millard, RN  08/19/2016 10:36 AM  Sign at close encounter Nursing Pain Medication Assessment:  Safety precautions to be maintained throughout the outpatient stay will include: orient to surroundings, keep bed in low position, maintain call bell within reach at all times, provide assistance with transfer out of bed and ambulation.  Medication Inspection Compliance: Pill count conducted under aseptic  conditions, in front of the patient. Neither the pills nor the bottle was removed from the patient's sight at any time. Once count was completed pills were immediately returned to the  patient in their original bottle.  Medication: Oxycodone IR Pill/Patch Count: 23 of 120 pills remain Pill/Patch Appearance: Markings consistent with prescribed medication Bottle Appearance: Standard pharmacy container. Clearly labeled. Filled Date 04 / 30 / 2018 Last Medication intake:  Today   Pharmacokinetics: Liberation and absorption (onset of action): WNL Distribution (time to peak effect): WNL Metabolism and excretion (duration of action): WNL         Pharmacodynamics: Desired effects: Analgesia: Audrey Garrett reports >50% benefit. Functional ability: Patient reports that medication allows her to accomplish basic ADLs Clinically meaningful improvement in function (CMIF): Sustained CMIF goals met Perceived effectiveness: Described as relatively effective, allowing for increase in activities of daily living (ADL) Undesirable effects: Side-effects or Adverse reactions: None reported Monitoring: [place] PMP: Online review of the past 74-monthperiod conducted. Compliant with practice rules and regulations List of all UDS test(s) done:  Lab Results  Component Value Date   TOXASSSELUR FINAL 05/20/2016   T[place]FINAL 12/22/2015   TO'BrienFINAL 10/01/2015   TMill NeckFINAL 07/02/2015   [place]FINAL 04/03/2015   Last UDS on record: ToxAssure Select 13  Date Value Ref Range Status  05/20/2016 FINAL  Final    Comment:    ==================================================================== TOXASSURE SELECT 13 (MW) ==================================================================== Specimen Alert Note:  Urinary creatinine is low; ability to detect some drugs may be compromised.  Interpret results with caution. ==================================================================== Test                              Result       Flag       Units Drug Present and Declared for Prescription Verification   Oxycodone                      8680         EXPECTED   ng/mg creat   Noroxycodone                   13710        EXPECTED   ng/mg creat    Sources of oxycodone include scheduled prescription medications.    Noroxycodone is an expected metabolite of oxycodone. ==================================================================== Test                      Result    Flag   Units      Ref Range   Creatinine              10        L      mg/dL      >=20 ==================================================================== Declared Medications:  The flagging and interpretation on this report are based on the  following declared medications.  Unexpected results may arise from  inaccuracies in the declared medications.  **Note: The testing scope of this panel includes these medications:  Oxycodone  **Note: The testing scope of this panel does not include following  reported medications:  Calcium Carbonate (Tums)  Ciprofloxacin (Cipro)  Furosemide (Lasix)  Lactulose  Ledipasvir  Magnesium (Mag-Ox)  Multivitamin (MVI)  Omeprazole (Prilosec)  Paroxetine (Paxil)  Pregabalin (Lyrica)  Sofosbuvir  Spironolactone (Aldactone)  Sucralfate (Carafate)  Vitamin B1  Vitamin K ==================================================================== For clinical consultation, please call ([phone] ====================================================================    UDS interpretation: Compliant          Medication Assessment Form: Reviewed. Patient indicates being compliant with therapy Treatment compliance: Compliant Risk Assessment Profile: Aberrant behavior:  See prior evaluations. None observed or detected today Comorbid factors increasing risk of overdose: See prior notes. No additional risks detected today Risk of substance use disorder (SUD): Low Opioid Risk Tool (ORT) Total  Score:    Interpretation Table:  Score <3 = Low Risk for SUD  Score between 4-7 = Moderate Risk for SUD  Score >8 = High Risk for Opioid Abuse   Risk Mitigation Strategies:  Patient Counseling: Covered Patient-Prescriber Agreement (PPA): Present and active  Notification to other healthcare providers: Done  Pharmacologic Plan: No change in therapy, at this time  Laboratory Chemistry  Inflammation Markers Lab Results  Component Value Date   CRP <0.5 04/03/2015   ESRSEDRATE 27 04/03/2015   (CRP: Acute Phase) (ESR: Chronic Phase) Renal Function Markers Lab Results  Component Value Date   BUN 7 04/12/2016   CREATININE 0.69 04/12/2016   GFRAA >60 04/12/2016   GFRNONAA >60 04/12/2016   Hepatic Function Markers Lab Results  Component Value Date   AST 20 04/12/2016   ALT 9 (L) 04/12/2016   ALBUMIN 3.5 04/12/2016   ALKPHOS 59 04/12/2016   Electrolytes Lab Results  Component Value Date   NA 130 (L) 04/12/2016   K 3.4 (L) 04/12/2016   CL 97 (L) 04/12/2016   CALCIUM 9.3 04/12/2016   MG 1.9 01/22/2016   Neuropathy Markers Lab Results  Component Value Date   VITAMINB12 591 02/13/2016   Bone Pathology Markers Lab Results  Component Value Date   ALKPHOS 59 04/12/2016   CALCIUM 9.3 04/12/2016   Coagulation Parameters Lab Results  Component Value Date   INR 1.21 04/01/2016   LABPROT 15.4 (H) 04/01/2016   APTT 34 01/20/2016   PLT 147 (L) 07/29/2016   Cardiovascular Markers Lab Results  Component Value Date   BNP 929 (H) 09/23/2013   HGB 12.6 07/29/2016   HCT 36.7 07/29/2016   Note: Lab results reviewed.  Recent Diagnostic Imaging Review  Nm Pet Image Initial (pi) Skull Base To Thigh Result Date: 08/11/2016 CLINICAL DATA:  Initial treatment strategy for pulmonary nodules. EXAM: NUCLEAR MEDICINE PET SKULL BASE TO THIGH TECHNIQUE: 12.4 mCi F-18 FDG was injected intravenously. Full-ring PET imaging was performed from the skull base to thigh after the radiotracer.  CT data was obtained and used for attenuation correction and anatomic localization. FASTING BLOOD GLUCOSE:  Value: 138 mg/dl COMPARISON:  Chest CT from 08/03/2016 FINDINGS: NECK No hypermetabolic lymph nodes in the neck. CHEST 1.3 by 1.0 cm right upper lobe nodule on image 104/3 has a maximum SUV of 1.8. The ground-glass density nodules observed in both upper lobes and the 0.7 by 0.6 cm solid nodule in the left upper lobe on image 100/3 are not associated with appreciable metabolic activity. Aortic and branch vessel atherosclerotic vascular disease. ABDOMEN/PELVIS The right kidney upper pole renal mass seen on prior CT abdomen from 02/17/2016 has similar density and similar metabolic activity to the surrounding renal parenchymal tissue. The appearance on prior CT of 02/17/2016 was very suspicious for renal cell carcinoma. Cirrhosis and chronic ascites noted along with chronic low-level omental and mesenteric edema. Gallstones are present in the gallbladder. No abnormal hepatic, splenic, or pancreatic activity. No pathologic or hypermetabolic retroperitoneal adenopathy. Physiologic activity in bowel. Fluid density 7.6 by 5.5 cm structure along the right posterolateral margin of the uterus, possibly loculated ascites or an ovarian lesion, no associated hypermetabolic activity. Adjacent pelvic ascites noted. SKELETON Posterolateral rod and pedicle screw fixation of the entire lumbar spine and much of the  thoracic spine. No significant abnormal hypermetabolic bony activity. IMPRESSION: 1. The right upper lobe pulmonary nodule is only mildly metabolic, with an SUV of 1.8. Morphologically this lesion has a concerning appearance and I am still suspicious for the possibility of low-grade adenocarcinoma. 2. Sub solid nodules in both upper lobes and the smaller 7 by 6 mm solid nodule in the left upper lobe are not metabolic on today's exam but require surveillance for low-grade malignancy. 3. The patient has a known 1.7 cm  enhancing mass of the right kidney upper pole. On today's PET-CT this seems to have a similar metabolic activity to the rest of the kidney. On balance the appearance is highly suspicious for renal cell carcinoma. No current findings of hypermetabolic metastatic adenopathy. 4. Cirrhosis and ascites. 5. A 7 by 6 cm loculated fluid collection along the right posterolateral margin of the uterus, could represent loculated ascites or a cystic ovarian lesion. No hypermetabolic activity associated with this lesion. 6. Cholelithiasis. Electronically Signed   By: Van Clines M.D.   On: 08/11/2016 12:08   Note: Imaging results reviewed.          Meds  The patient has a current medication list which includes the following prescription(s): calcium carbonate, furosemide, lactulose, magnesium oxide, multivitamin with minerals, oxycodone hcl, oxycodone hcl, oxycodone hcl, pantoprazole, paroxetine, phytonadione, pregabalin, spironolactone, sucralfate, thiamine, and trazodone.  Current Outpatient Prescriptions on File Prior to Visit  Medication Sig  . calcium carbonate (TUMS - DOSED IN MG ELEMENTAL CALCIUM) 500 MG chewable tablet Chew 2 tablets by mouth 2 (two) times daily.  . furosemide (LASIX) 40 MG tablet Take by mouth 2 (two) times daily.   Marland Kitchen lactulose (CHRONULAC) 10 GM/15ML solution Take 10 g by mouth 3 (three) times daily.   . Magnesium Oxide 250 MG TABS Take 1 tablet by mouth daily.  . Multiple Vitamin (MULTIVITAMIN WITH MINERALS) TABS tablet Take 1 tablet by mouth daily.  . pantoprazole (PROTONIX) 40 MG tablet TAKE 1 TABLET (40 MG TOTAL) BY MOUTH 2 (TWO) TIMES DAILY.  Marland Kitchen PARoxetine (PAXIL) 40 MG tablet Take 40 mg by mouth every morning.  . phytonadione (VITAMIN K) 5 MG tablet Take 5 mg by mouth 3 (three) times a week. Mon, Wed, Fri  . pregabalin (LYRICA) 75 MG capsule Take 75 mg by mouth 2 (two) times daily.  Marland Kitchen spironolactone (ALDACTONE) 50 MG tablet Take 100 mg by mouth daily.   . sucralfate  (CARAFATE) 1 GM/10ML suspension Take 1 g by mouth 3 (three) times daily as needed.   . thiamine (VITAMIN B-1) 100 MG tablet Take 1 tablet (100 mg total) by mouth daily.  . traZODone (DESYREL) 50 MG tablet Take 50 mg by mouth at bedtime.   No current facility-administered medications on file prior to visit.    ROS  Constitutional: Denies any fever or chills Gastrointestinal: No reported hemesis, hematochezia, vomiting, or acute GI distress Musculoskeletal: Denies any acute onset joint swelling, redness, loss of ROM, or weakness Neurological: No reported episodes of acute onset apraxia, aphasia, dysarthria, agnosia, amnesia, paralysis, loss of coordination, or loss of consciousness  Allergies  Ms. Whack has No Known Allergies.  PFSH  Drug: Ms. Abrams  reports that she does not use drugs. Alcohol:  reports that she does not drink alcohol. Tobacco:  reports that she has been smoking Cigarettes.  She has a 84.00 pack-year smoking history. She has never used smokeless tobacco. Medical:  has a past medical history of Alcohol abuse; Alcoholic cirrhosis of liver  without ascites ([place]); Anemia; Anxiety; Arthropathy; Back pain; Bronchitis; BRONCHITIS, ACUTE (11/01/2009); Chronic hepatitis C ([place]); Chronic LBP (12/25/2014); Cirrhosis ([place]); DDD (degenerative disc disease), lumbar (01/01/2015); Depression; Diabetes mellitus without complication ([place]); Diverticulosis; Edema; Fatigue; Fibromyalgia; GERD (gastroesophageal reflux disease); High risk medications (not anticoagulants) long-term use; Hyperglycemia; Hypertension; Kidney mass; Left leg pain (01/01/2015); Lumbago; Lumbar radicular pain (01/01/2015); Muscle spasm; Neck pain (01/01/2015); Polyarthritis; Reflux; Restless leg syndrome; RLS (restless legs syndrome); Sacroiliac pain (01/01/2015); Shoulder pain; Sinusitis; SOB (shortness of breath); Spine disorder; Stress headaches; Upper back pain (01/01/2015); Urinary frequency; and Vitamin D deficiency  disease. Family: family history includes [name] in her father; Breast cancer (age of onset: 67) in her sister; Heart disease in her mother; Stroke in her mother.  Past Surgical History:  Procedure Laterality Date  . BACK SURGERY  12/19/2011  . BACK SURGERY    . COLONOSCOPY WITH PROPOFOL N/A 09/25/2015   Procedure: COLONOSCOPY WITH PROPOFOL;  Surgeon: Lollie Sails, MD;  Location: [facility] ENDOSCOPY;  Service: Endoscopy;  Laterality: N/A;  . COLONOSCOPY WITH PROPOFOL N/A 04/01/2016   Procedure: COLONOSCOPY WITH PROPOFOL;  Surgeon: Lollie Sails, MD;  Location: [facility] ENDOSCOPY;  Service: Endoscopy;  Laterality: N/A;  . ESOPHAGOGASTRODUODENOSCOPY N/A 04/14/2015   Procedure: ESOPHAGOGASTRODUODENOSCOPY (EGD);  Surgeon: Lollie Sails, MD;  Location: [facility] ENDOSCOPY;  Service: Endoscopy;  Laterality: N/A;  . ESOPHAGOGASTRODUODENOSCOPY (EGD) WITH PROPOFOL N/A 09/16/2015   Procedure: ESOPHAGOGASTRODUODENOSCOPY (EGD) WITH PROPOFOL;  Surgeon: Lollie Sails, MD;  Location: [facility] ENDOSCOPY;  Service: Endoscopy;  Laterality: N/A;  . ESOPHAGOGASTRODUODENOSCOPY (EGD) WITH PROPOFOL N/A 01/22/2016   Procedure: ESOPHAGOGASTRODUODENOSCOPY (EGD) WITH PROPOFOL;  Surgeon: [name], MD;  Location: [place];  Service: Endoscopy;  Laterality: N/A;  . ESOPHAGOGASTRODUODENOSCOPY (EGD) WITH PROPOFOL N/A 04/01/2016   Procedure: ESOPHAGOGASTRODUODENOSCOPY (EGD) WITH PROPOFOL;  Surgeon: Lollie Sails, MD;  Location: [facility] ENDOSCOPY;  Service: Endoscopy;  Laterality: N/A;  . HERNIA REPAIR N/A 10/2015   abdominal   . TONSILLECTOMY    . TUBAL LIGATION    . UMBILICAL HERNIA REPAIR N/A 11/06/2015   Procedure: HERNIA REPAIR UMBILICAL ADULT;  Surgeon: [name], MD;  Location: [facility] ORS;  Service: General;  Laterality: N/A;   Constitutional Exam  General appearance: Well nourished, well developed, and well hydrated. In no apparent acute distress Vitals:   08/19/16 1030  BP: 109/70  Pulse: 68  Resp:  16  Temp: 98.5 F (36.9 C)  SpO2: 100%  Weight: 159 lb (72.1 kg)  Height: _0  (1.753 m)   BMI Assessment: Estimated body mass index is 23.48 kg/m as calculated from the following:   Height as of this encounter: _1  (1.753 m).   Weight as of this encounter: 159 lb (72.1 kg).  BMI interpretation table: BMI level Category Range association with higher incidence of chronic pain  <18 kg/m2 Underweight   18.5-24.9 kg/m2 Ideal body weight   25-29.9 kg/m2 Overweight Increased incidence by 20%  30-34.9 kg/m2 Obese (Class I) Increased incidence by 68%  35-39.9 kg/m2 Severe obesity (Class II) Increased incidence by 136%  >40 kg/m2 Extreme obesity (Class III) Increased incidence by 254%   BMI Readings from Last 4 Encounters:  08/19/16 23.48 kg/m  08/19/16 22.90 kg/m  08/13/16 22.24 kg/m  08/04/16 22.10 kg/m   Wt Readings from Last 4 Encounters:  08/19/16 159 lb (72.1 kg)  08/19/16 159 lb 9.6 oz (72.4 kg)  08/13/16 155 lb (70.3 kg)  08/04/16 154 lb (69.9 kg)  Psych/Mental status: Alert, oriented x  3 (person, place, & time)       Eyes: PERLA Respiratory: No evidence of acute respiratory distress  Cervical Spine Exam  Inspection: No masses, redness, or swelling Alignment: Symmetrical Functional ROM: Unrestricted ROM      Stability: No instability detected Muscle strength & Tone: Functionally intact Sensory: Unimpaired Palpation: No palpable anomalies              Upper Extremity (UE) Exam    Side: Right upper extremity  Side: Left upper extremity  Inspection: No masses, redness, swelling, or asymmetry. No contractures  Inspection: No masses, redness, swelling, or asymmetry. No contractures  Functional ROM: Unrestricted ROM          Functional ROM: Unrestricted ROM          Muscle strength & Tone: Functionally intact  Muscle strength & Tone: Functionally intact  Sensory: Unimpaired  Sensory: Unimpaired  Palpation: No palpable anomalies              Palpation: No palpable  anomalies              Specialized Test(s): Deferred         Specialized Test(s): Deferred          Thoracic Spine Exam  Inspection: No masses, redness, or swelling Alignment: Symmetrical Functional ROM: Unrestricted ROM Stability: No instability detected Sensory: Unimpaired Muscle strength & Tone: No palpable anomalies  Lumbar Spine Exam  Inspection: No masses, redness, or swelling Alignment: Symmetrical Functional ROM: Minimal ROM      Stability: No instability detected Muscle strength & Tone: Functionally intact Sensory: Movement-associated pain Palpation: Complains of area being tender to palpation       Provocative Tests: Lumbar Hyperextension and rotation test: Positive bilaterally for facet joint pain. Patrick's Maneuver: Positive for bilateral S-I arthralgia and for bilateral hip arthralgia  Gait & Posture Assessment  Ambulation: Patient ambulates using a cane Gait: Limited. Using assistive device to ambulate Posture: Poor   Lower Extremity Exam    Side: Right lower extremity  Side: Left lower extremity  Inspection: No masses, redness, swelling, or asymmetry. No contractures  Inspection: No masses, redness, swelling, or asymmetry. No contractures  Functional ROM: Unrestricted ROM          Functional ROM: Unrestricted ROM          Muscle strength & Tone: Functionally intact  Muscle strength & Tone: Functionally intact  Sensory: Unimpaired  Sensory: Unimpaired  Palpation: No palpable anomalies  Palpation: No palpable anomalies   Assessment  Primary Diagnosis & Pertinent Problem List: The primary encounter diagnosis was Chronic low back pain (Location of Primary Source of Pain) (Bilateral) (L>R). Diagnoses of Lumbar facet syndrome (Bilateral) (L>R), Chronic sacroiliac joint pain (B) (R>L), Failed back surgical syndrome, Chronic groin pain, left, Chronic pain syndrome, Long term prescription opiate use, and Opiate use were also pertinent to this visit.  Status Diagnosis    Recurring Persistent Worsening 1. Chronic low back pain (Location of Primary Source of Pain) (Bilateral) (L>R)   2. Lumbar facet syndrome (Bilateral) (L>R)   3. Chronic sacroiliac joint pain (B) (R>L)   4. Failed back surgical syndrome   5. Chronic groin pain, left   6. Chronic pain syndrome   7. Long term prescription opiate use   8. Opiate use     Problems updated and reviewed during this visit: Problem  Chronic Groin Pain, Left  Chronic sacroiliac joint pain (B) (R>L)   Plan of Care  Pharmacotherapy (Medications Ordered): Meds  ordered this encounter  Medications  . Oxycodone HCl 20 MG TABS    Sig: Take 1 tablet (20 mg total) by mouth every 6 (six) hours as needed.    Dispense:  120 tablet    Refill:  0    Fill one day early if pharmacy is closed on scheduled refill date. Do not fill until: 09/24/16 To last until: 10/24/16  . Oxycodone HCl 20 MG TABS    Sig: Take 1 tablet (20 mg total) by mouth every 6 (six) hours as needed.    Dispense:  120 tablet    Refill:  0    Fill one day early if pharmacy is closed on scheduled refill date. Do not fill until: 10/24/16 To last until: 11/23/16  . Oxycodone HCl 20 MG TABS    Sig: Take 1 tablet (20 mg total) by mouth every 6 (six) hours.    Dispense:  120 tablet    Refill:  0    Fill one day early if pharmacy is closed on scheduled refill date. Do not fill until: 08/25/16 To last until: 09/24/16   New Prescriptions   No medications on file   Medications administered today: Ms. Len had no medications administered during this visit. Lab-work, procedure(s), and/or referral(s): Orders Placed This Encounter  Procedures  . LUMBAR FACET(MEDIAL BRANCH NERVE BLOCK) MBNB  . SACROILIAC JOINT INJECTINS   Imaging and/or referral(s): None  Interventional therapies: Planned, scheduled, and/or pending:   Diagnostic bilateral lumbar facet block + diagnostic bilateral sacroiliac joint block under fluoroscopic guidance and IV sedation     Considering:   Diagnostic bilateral lumbar facet block  Possible bilateral lumbar facet RFA  Diagnostic left caudal Epidural steroid injection + diagnostic epidurogram  Possible Racz procedure  Diagnostic bilateral sacroiliac joint injection  Possible bilateral sacroiliac joint RFA  Diagnostic left cervical epidural steroid injection  Diagnostic bilateral cervical facet block  Possible bilateral cervical facet RFA    Palliative PRN treatment(s):   Diagnostic bilateral lumbar facet block under fluoroscopic guidance and IV sedation  Diagnostic bilateral intra-articular hip joint injection under fluoroscopic guidance and IV sedation  Diagnostic bilateral sacroiliac joint block under fluoroscopic guidance and IV sedation    Provider-requested follow-up: Return in 3 months (on 11/18/2016) for Med-Mgmt, w/ NP, in addition, procedure (w/ sedation):, (ASAP), by MD.  Future Appointments Date Time Provider [place]  08/30/2016 9:00 AM [name], MD [facility] None  09/08/2016 10:45 AM Hollice Espy, MD [facility] None  09/09/2016 9:30 AM [facility] None  10/21/2016 10:30 AM [facility] None  10/21/2016 10:45 AM Sindy Guadeloupe, MD [facility] None  11/18/2016 9:15 AM Vevelyn Francois, NP [facility] None   Primary Care Physician: Jodi Marble, MD Location: [facility] Note by: Kathlen Brunswick. Dossie Arbour, M.D, DABA, DABAPM, DABPM, DABIPP, FIPP Date: 08/19/2016; Time: 1:55 PM  Patient instructions provided during this appointment: Patient Instructions   ____________________________________________________________________________________________  Preparing for Procedure with Sedation Instructions: . Oral Intake: Do not eat or drink anything for at least 8 hours prior to your procedure. . Transportation: Public transportation is not allowed. Bring an adult driver. The driver must be physically present in our waiting room before  any procedure can be started. Mar[place] Kitchen Physical Assistance: Bring an adult physically capable of assisting you, in the event you need help. This adult should keep you company at home for at least 6 hours after the procedure. . Blood Pressure Medicine: Take your blood pressure medicine with a  sip of water the morning of the procedure. . Blood thinners:  . Diabetics on insulin: Notify the staff so that you can be scheduled 1st case in the morning. If your diabetes requires high dose insulin, take only  of your normal insulin dose the morning of the procedure and notify the staff that you have done so. . Preventing infections: Shower with an antibacterial soap the morning of your procedure. . Build-up your immune system: Take 1000 mg of Vitamin C with every meal (3 times a day) the day prior to your procedure. Marland Kitchen Antibiotics: Inform the staff if you have a condition or reason that requires you to take antibiotics before dental procedures. . Pregnancy: If you are pregnant, call and cancel the procedure. . Sickness: If you have a cold, fever, or any active infections, call and cancel the procedure. . Arrival: You must be in the facility at least 30 minutes prior to your scheduled procedure. . Children: Do not bring children with you. . Dress appropriately: Bring dark clothing that you would not mind if they get stained. . Valuables: Do not bring any jewelry or valuables. Procedure appointments are reserved for interventional treatments only. Marland Kitchen No Prescription Refills. . No medication changes will be discussed during procedure appointments. . No disability issues will be discussed. ______________________________________________________________________________________________  ____________________________________________________________________________________________  Appointment Policy  It is our goal and responsibility to provide the medical community with assistance in the evaluation and management of  patients with chronic pain. Unfortunately our resources are limited. Because we do not have an unlimited amount of time, or available appointments, we are required to closely monitor and manage their use. The following rules exist to maximize their use:  [name] responsibilities: 1. Punctuality: You are required to be physically present and registered in our facility at least 30 minutes before your appointment. 2. Tardiness: The cutoff is your appointment time. If you have an appointment scheduled for 10:00 AM and you arrive at 10:01, you will be required to reschedule your appointment.  3. Plan ahead: Always assume that you will encounter traffic on your way in. Plan for it. If you are dependent on a driver, make sure they understand these rules and the need to arrive early. 4. Other appointments and responsibilities: Avoid scheduling any other appointments before or after your pain clinic appointments.  5. Be prepared: Write down everything that you need to discuss with your healthcare provider and give this information to the admitting nurse. Write down the medications that you will need refilled. Bring your pills and bottles (even the empty ones), to all of your appointments, except for those where a procedure is scheduled. 6. No children or pets: Find someone to take care of them. It is not appropriate to bring them in. 7. Scheduling changes: We request "advanced notification" of any changes or cancellations. 8. Advanced notification: Defined as a time period of more than 24 hours prior to the originally scheduled appointment. This allows for the appointment to be offered to other patients. 9. Rescheduling: When a visit is rescheduled, it will require the cancellation of the original appointment. For this reason they both fall within the category of "Cancellations".  10. Cancellations: They require advanced notification. Any cancellation less than 24 hours before the  appointment will be recorded  as a "No Show". 11. No Show: Defined as an unkept appointment where the patient failed to notify or declare to the practice their intention or inability to keep the appointment.  Corrective process for repeat offenders:  1. Tardiness: Three (3) episodes of rescheduling due to late arrivals will be recorded as one (1) "No Show". 2. Cancellation or reschedule: Three (3) cancellations or rescheduling will be recorded as one (1) "No Show". 3. "No Shows": Three (3) "No Shows" within a 12 month period will result in discharge from the practice.  ____________________________________________________________________________________________  ____________________________________________________________________________________________  Medication Rules  Applies to: All patients receiving prescriptions (written or electronic).  Pharmacy of record: Pharmacy where electronic prescriptions will be sent. If written prescriptions are taken to a different pharmacy, please inform the nursing staff. The pharmacy listed in the electronic medical record should be the one where you would like electronic prescriptions to be sent.  Prescription refills: Only during scheduled appointments. Applies to both, written and electronic prescriptions.  NOTE: The following applies primarily to controlled substances (Opioid Pain Medications)  [name] responsibilities: 12. Pain Pills: Bring all pain pills to every appointment (except for procedure appointments). 13. Pill Bottles: Bring pills in original pharmacy bottle. Always bring newest bottle. Bring bottle, even if empty. 14. Medication refills: You are responsible for knowing and keeping track of what medications you need refilled. The day before your appointment, write a list of all prescriptions that need to be refilled. Bring that list to your appointment and give it to the admitting nurse. Prescriptions will be written only during appointments. If you forget a  medication, it will not be "Called in", "Faxed", or "electronically sent". You will need to get another appointment to get these prescribed. 15. Prescription Accuracy: You are responsible for carefully inspecting your prescriptions before leaving our office. Have the discharge nurse carefully go over each prescription with you, before taking them home. Make sure that your name is accurately spelled, that your address is correct. Check the name and dose of your medication to make sure it is accurate. Check the number of pills, and the written instructions to make sure they are clear and accurate. Make sure that you are given enough medication to last until your next medication refill appointment. 16. Taking Medication: Take medication as prescribed. Never take more pills than instructed. Never take medication more frequently than prescribed. Taking less pills or less frequently is permitted and encouraged, when it comes to controlled substances (written prescriptions).  19. Inform other Doctors: Always inform, all of your healthcare providers, of all the medications you take. 18. Pain Medication from other Providers: You are not allowed to accept any additional pain medication from any other Doctor or Healthcare provider. There are two exceptions to this rule. (see below) In the event that you require additional pain medication, you are responsible for notifying us, as stated below. 19. Medication Agreement: You are responsible for carefully reading and following our Medication Agreement. This must be signed before receiving any prescriptions from our practice. Safely store a copy of your signed Agreement. Violations to the Agreement will result in no further prescriptions. (Additional copies of our Medication Agreement are available upon request.) 20. Laws, Rules, & Regulations: All patients are expected to follow all Federal and [employer], Rules, [employer]. Ignorance of the Laws does not  constitute a valid excuse.  Exceptions: There are only two exceptions to the rule of not receiving pain medications from other Healthcare Providers. 4. Exception #1 (Emergencies): In the event of an emergency (i.e.: accident requiring emergency care), you are allowed to receive additional pain medication. However, you are responsible for: As soon as you are able, call our office [phone], at any time  of the day or night, and leave a message stating your name, the date and nature of the emergency, and the name and dose of the medication prescribed. In the event that your call is answered by a member of our staff, make sure to document and save the date, time, and the name of the person that took your information.  5. Exception #2 (Planned Surgery): In the event that you are scheduled by another doctor or dentist to have any type of surgery or procedure, you are allowed (for a period no longer than 30 days), to receive additional pain medication, for the acute post-op pain. However, in this case, you are responsible for picking up a copy of our "Post-op Pain Management for Surgeons" handout, and giving it to your surgeon or dentist. This document is available at our office, and does not require an appointment to obtain it. Simply go to our office during business hours (Monday-Thursday from 8:00 AM to 4:00 PM) (Friday 8:00 AM to 12:00 Noon) or if you have a scheduled appointment with [place], prior to your surgery, and ask for it by name. In addition, you will need to provide [place] with your name, name of your surgeon, type of surgery, and date of procedure or surgery.  _____________________________________________________________________________________________  ____________________________________________________________________________________________  Pain Scale  Introduction: The pain score used by this practice is the Verbal Numerical Rating Scale (VNRS-11). This is an 11-point scale. It is for adults  and children 10 years or older. There are significant differences in how the pain score is reported, used, and applied. Forget everything you learned in the past and learn this scoring system.  General Information: The scale should reflect your current level of pain. Unless you are specifically asked for the level of your worst pain, or your average pain. If you are asked for one of these two, then it should be understood that it is over the past 24 hours.  Basic Activities of Daily Living (ADL): Personal hygiene, dressing, eating, transferring, and using restroom.  Instructions: Most patients tend to report their level of pain as a combination of two factors, their physical pain and their psychosocial pain. This last one is also known as "suffering" and it is reflection of how physical pain affects you socially and psychologically. From now on, report them separately. From this point on, when asked to report your pain level, report only your physical pain. Use the following table for reference.  Pain Clinic Pain Levels (0-5/10)  Pain Level Score  Description  No Pain 0   Mild pain 1 Nagging, annoying, but does not interfere with basic activities of daily living (ADL). Patients are able to eat, bathe, get dressed, toileting (being able to get on and off the toilet and perform personal hygiene functions), transfer (move in and out of bed or a chair without assistance), and maintain continence (able to control bladder and bowel functions). Blood pressure and heart rate are unaffected. A normal heart rate for a healthy adult ranges from 60 to 100 bpm (beats per minute).   Mild to moderate pain 2 Noticeable and distracting. Impossible to hide from other people. More frequent flare-ups. Still possible to adapt and function close to normal. It can be very annoying and may have occasional stronger flare-ups. With discipline, patients may get used to it and adapt.   Moderate pain 3 Interferes significantly  with activities of daily living (ADL). It becomes difficult to feed, bathe, get dressed, get on and off the toilet or  to perform personal hygiene functions. Difficult to get in and out of bed or a chair without assistance. Very distracting. With effort, it can be ignored when deeply involved in activities.   Moderately severe pain 4 Impossible to ignore for more than a few minutes. With effort, patients may still be able to manage work or participate in some social activities. Very difficult to concentrate. Signs of autonomic nervous system discharge are evident: dilated pupils (mydriasis); mild sweating (diaphoresis); sleep interference. Heart rate becomes elevated (>115 bpm). Diastolic blood pressure (lower number) rises above 100 mmHg. Patients find relief in laying down and not moving.   Severe pain 5 Intense and extremely unpleasant. Associated with frowning face and frequent crying. Pain overwhelms the senses.  Ability to do any activity or maintain social relationships becomes significantly limited. Conversation becomes difficult. Pacing back and forth is common, as getting into a comfortable position is nearly impossible. Pain wakes you up from deep sleep. Physical signs will be obvious: pupillary dilation; increased sweating; goosebumps; brisk reflexes; cold, clammy hands and feet; nausea, vomiting or dry heaves; loss of appetite; significant sleep disturbance with inability to fall asleep or to remain asleep. When persistent, significant weight loss is observed due to the complete loss of appetite and sleep deprivation.  Blood pressure and heart rate becomes significantly elevated. Caution: If elevated blood pressure triggers a pounding headache associated with blurred vision, then the patient should immediately seek attention at an urgent or emergency care unit, as these may be signs of an impending stroke.    Emergency Department Pain Levels (6-10/10)  Emergency Room Pain 6 Severely limiting.  Requires emergency care and should not be seen or managed at an outpatient pain management facility. Communication becomes difficult and requires great effort. Assistance to reach the emergency department may be required. Facial flushing and profuse sweating along with potentially dangerous increases in heart rate and blood pressure will be evident.   Distressing pain 7 Self-care is very difficult. Assistance is required to transport, or use restroom. Assistance to reach the emergency department will be required. Tasks requiring coordination, such as bathing and getting dressed become very difficult.   Disabling pain 8 Self-care is no longer possible. At this level, pain is disabling. The individual is unable to do even the most "basic" activities such as walking, eating, bathing, dressing, transferring to a bed, or toileting. Fine motor skills are lost. It is difficult to think clearly.   Incapacitating pain 9 Pain becomes incapacitating. Thought processing is no longer possible. Difficult to remember your own name. Control of movement and coordination are lost.   The worst pain imaginable 10 At this level, most patients pass out from pain. When this level is reached, collapse of the autonomic nervous system occurs, leading to a sudden drop in blood pressure and heart rate. This in turn results in a temporary and dramatic drop in blood flow to the brain, leading to a loss of consciousness. Fainting is one of the body's self defense mechanisms. Passing out puts the brain in a calmed state and causes it to shut down for a while, in order to begin the healing process.    Summary: 1. Refer to this scale when providing [place] with your pain level. 2. Be accurate and careful when reporting your pain level. This will help with your care. 3. Over-reporting your pain level will lead to loss of credibility. 4. Even a level of 1/10 means that there is pain and will be treated at our  facility. 5. High, inaccurate  reporting will be documented as "Symptom Exaggeration", leading to loss of credibility and suspicions of possible secondary gains such as obtaining more narcotics, or wanting to appear disabled, for fraudulent reasons. 6. Only pain levels of 5 or below will be seen at our facility. 7. Pain levels of 6 and above will be sent to the Emergency Department and the appointment cancelled. _____________________________________________________________________________________________  GENERAL RISKS AND COMPLICATIONS  What are the risk, side effects and possible complications? Generally speaking, most procedures are safe.  However, with any procedure there are risks, side effects, and the possibility of complications.  The risks and complications are dependent upon the sites that are lesioned, or the type of nerve block to be performed.  The closer the procedure is to the spine, the more serious the risks are.  Great care is taken when placing the radio frequency needles, block needles or lesioning probes, but sometimes complications can occur. 1. Infection: Any time there is an injection through the skin, there is a risk of infection.  This is why sterile conditions are used for these blocks.  There are four possible types of infection. 1. Localized skin infection. 2. Central Nervous System Infection-This can be in the form of Meningitis, which can be deadly. 3. Epidural Infections-This can be in the form of an epidural abscess, which can cause pressure inside of the spine, causing compression of the spinal cord with subsequent paralysis. This would require an emergency surgery to decompress, and there are no guarantees that the patient would recover from the paralysis. 4. Discitis-This is an infection of the intervertebral discs.  It occurs in about 1% of discography procedures.  It is difficult to treat and it may lead to surgery.        2. Pain: the needles have to go through skin and soft tissues, will  cause soreness.       3. Damage to internal structures:  The nerves to be lesioned may be near blood vessels or    other nerves which can be potentially damaged.       4. Bleeding: Bleeding is more common if the patient is taking blood thinners such as  aspirin, Coumadin, Ticiid, Plavix, etc., or if he/she have some genetic predisposition  such as hemophilia. Bleeding into the spinal canal can cause compression of the spinal  cord with subsequent paralysis.  This would require an emergency surgery to  decompress and there are no guarantees that the patient would recover from the  paralysis.       5. Pneumothorax:  Puncturing of a lung is a possibility, every time a needle is introduced in  the area of the chest or upper back.  Pneumothorax refers to free air around the  collapsed lung(s), inside of the thoracic cavity (chest cavity).  Another two possible  complications related to a similar event would include: Hemothorax and Chylothorax.   These are variations of the Pneumothorax, where instead of air around the collapsed  lung(s), you may have blood or chyle, respectively.       6. Spinal headaches: They may occur with any procedures in the area of the spine.       7. Persistent CSF (Cerebro-Spinal Fluid) leakage: This is a rare problem, but may occur  with prolonged intrathecal or epidural catheters either due to the formation of a fistulous  track or a dural tear.       8. Nerve damage: By working so close to the  spinal cord, there is always a possibility of  nerve damage, which could be as serious as a permanent spinal cord injury with  paralysis.       9. Death:  Although rare, severe deadly allergic reactions known as "Anaphylactic  reaction" can occur to any of the medications used.      10. Worsening of the symptoms:  We can always make thing worse.  What are the chances of something like this happening? Chances of any of this occuring are extremely low.  By statistics, you have more of a chance  of getting killed in a motor vehicle accident: while driving to the hospital than any of the above occurring .  Nevertheless, you should be aware that they are possibilities.  In general, it is similar to taking a shower.  Everybody knows that you can slip, hit your head and get killed.  Does that mean that you should not shower again?  Nevertheless always keep in mind that statistics do not mean anything if you happen to be on the wrong side of them.  Even if a procedure has a 1 (one) in a 1,000,000 (million) chance of going wrong, it you happen to be that one..Also, keep in mind that by statistics, you have more of a chance of having something go wrong when taking medications.  Who should not have this procedure? If you are on a blood thinning medication (e.g. Coumadin, Plavix, see list of "Blood Thinners"), or if you have an active infection going on, you should not have the procedure.  If you are taking any blood thinners, please inform your physician.  How should I prepare for this procedure?  Do not eat or drink anything at least six hours prior to the procedure.  Bring a driver with you .  It cannot be a taxi.  Come accompanied by an adult that can drive you back, and that is strong enough to help you if your legs get weak or numb from the local anesthetic.  Take all of your medicines the morning of the procedure with just enough water to swallow them.  If you have diabetes, make sure that you are scheduled to have your procedure done first thing in the morning, whenever possible.  If you have diabetes, take only half of your insulin dose and notify our nurse that you have done so as soon as you arrive at the clinic.  If you are diabetic, but only take blood sugar pills (oral hypoglycemic), then do not take them on the morning of your procedure.  You may take them after you have had the procedure.  Do not take aspirin or any aspirin-containing medications, at least eleven (11) days prior  to the procedure.  They may prolong bleeding.  Wear loose fitting clothing that may be easy to take off and that you would not mind if it got stained with Betadine or blood.  Do not wear any jewelry or perfume  Remove any nail coloring.  It will interfere with some of our monitoring equipment.  NOTE: Remember that this is not meant to be interpreted as a complete list of all possible complications.  Unforeseen problems may occur.  BLOOD THINNERS The following drugs contain aspirin or other products, which can cause increased bleeding during surgery and should not be taken for 2 weeks prior to and 1 week after surgery.  If you should need take something for relief of minor pain, you may take acetaminophen which is found in  Tylenol,m Datril, Anacin-3 and Panadol. It is not blood thinner. The products listed below are.  Do not take any of the products listed below in addition to any listed on your instruction sheet.  A.P.C or A.P.C with Codeine Codeine Phosphate Capsules #3 Ibuprofen Ridaura  ABC compound Congesprin Imuran rimadil  Advil Cope Indocin Robaxisal  Alka-Seltzer Effervescent Pain Reliever and Antacid Coricidin or Coricidin-D  Indomethacin Rufen  Alka-Seltzer plus Cold Medicine Cosprin Ketoprofen S-A-C Tablets  Anacin Analgesic Tablets or Capsules Coumadin Korlgesic Salflex  Anacin Extra Strength Analgesic tablets or capsules CP-2 Tablets Lanoril Salicylate  Anaprox Cuprimine Capsules Levenox Salocol  Anexsia-D Dalteparin Magan Salsalate  Anodynos Darvon compound Magnesium Salicylate Sine-off  Ansaid Dasin Capsules Magsal Sodium Salicylate  Anturane Depen Capsules Marnal Soma  APF Arthritis pain formula Dewitt's Pills Measurin Stanback  Argesic Dia-Gesic Meclofenamic Sulfinpyrazone  Arthritis Bayer Timed Release Aspirin Diclofenac Meclomen Sulindac  Arthritis pain formula Anacin Dicumarol Medipren Supac  Analgesic (Safety coated) Arthralgen Diffunasal Mefanamic Suprofen    Arthritis Strength Bufferin Dihydrocodeine Mepro Compound Suprol  Arthropan liquid Dopirydamole Methcarbomol with Aspirin Synalgos  ASA tablets/Enseals Disalcid Micrainin Tagament  Ascriptin Doan's Midol Talwin  Ascriptin A/D Dolene Mobidin Tanderil  Ascriptin Extra Strength Dolobid Moblgesic Ticlid  Ascriptin with Codeine Doloprin or Doloprin with Codeine Momentum Tolectin  Asperbuf Duoprin Mono-gesic Trendar  Aspergum Duradyne Motrin or Motrin IB Triminicin  Aspirin plain, buffered or enteric coated Durasal Myochrisine Trigesic  Aspirin Suppositories Easprin Nalfon Trillsate  Aspirin with Codeine Ecotrin Regular or Extra Strength Naprosyn Uracel  Atromid-S Efficin Naproxen Ursinus  Auranofin Capsules Elmiron Neocylate Vanquish  Axotal Emagrin Norgesic Verin  Azathioprine Empirin or Empirin with Codeine Normiflo Vitamin E  Azolid Emprazil Nuprin Voltaren  Bayer Aspirin plain, buffered or children's or timed BC Tablets or powders Encaprin Orgaran Warfarin Sodium  Buff-a-Comp Enoxaparin Orudis Zorpin  Buff-a-Comp with Codeine Equegesic Os-Cal-Gesic   Buffaprin Excedrin plain, buffered or Extra Strength Oxalid   Bufferin Arthritis Strength Feldene Oxphenbutazone   Bufferin plain or Extra Strength Feldene Capsules Oxycodone with Aspirin   Bufferin with Codeine Fenoprofen Fenoprofen Pabalate or Pabalate-SF   Buffets II Flogesic Panagesic   Buffinol plain or Extra Strength Florinal or Florinal with Codeine Panwarfarin   Buf-Tabs Flurbiprofen Penicillamine   Butalbital Compound Four-way cold tablets Penicillin   Butazolidin Fragmin Pepto-Bismol   Carbenicillin Geminisyn Percodan   Carna Arthritis Reliever Geopen Persantine   Carprofen Gold's salt Persistin   Chloramphenicol Goody's Phenylbutazone   Chloromycetin Haltrain Piroxlcam   Clmetidine heparin Plaquenil   Cllnoril Hyco-pap Ponstel   Clofibrate Hydroxy chloroquine Propoxyphen         Before stopping any of these medications,  be sure to consult the physician who ordered them.  Some, such as Coumadin (Warfarin) are ordered to prevent or treat serious conditions such as "deep thrombosis", "pumonary embolisms", and other heart problems.  The amount of time that you may need off of the medication may also vary with the medication and the reason for which you were taking it.  If you are taking any of these medications, please make sure you notify your pain physician before you undergo any procedures.          Facet Joint Block The facet joints connect the bones of the spine (vertebrae). They make it possible for you to bend, twist, and make other movements with your spine. They also keep you from bending too far, twisting too far, and making other excessive movements. A facet joint block is a  procedure where a numbing medicine (anesthetic) is injected into a facet joint. Often, a type of anti-inflammatory medicine called a steroid is also injected. A facet joint block may be done to diagnose neck or back pain. If the pain gets better after a facet joint block, it means the pain is probably coming from the facet joint. If the pain does not get better, it means the pain is probably not coming from the facet joint. A facet joint block may also be done to relieve neck or back pain caused by an inflamed facet joint. A facet joint block is only done to relieve pain if the pain does not improve with other methods, such as medicine, exercise programs, and physical therapy. Tell a health care provider about:  Any allergies you have.  All medicines you are taking, including vitamins, herbs, eye drops, creams, and over-the-counter medicines.  Any problems you or family members have had with anesthetic medicines.  Any blood disorders you have.  Any surgeries you have had.  Any medical conditions you have.  Whether you are pregnant or may be pregnant. What are the risks? Generally, this is a safe procedure. However, problems  may occur, including:  Bleeding.  Injury to a nerve near the injection site.  Pain at the injection site.  Weakness or numbness in areas controlled by nerves near the injection site.  Infection.  Temporary fluid retention.  Allergic reactions to medicines or dyes.  Injury to other structures or organs near the injection site. What happens before the procedure?  Follow instructions from your health care provider about eating or drinking restrictions.  Ask your health care provider about:  Changing or stopping your regular medicines. This is especially important if you are taking diabetes medicines or blood thinners.  Taking medicines such as aspirin and ibuprofen. These medicines can thin your blood. Do not take these medicines before your procedure if your health care provider instructs you not to.  Do not take any new dietary supplements or medicines without asking your health care provider first.  Plan to have someone take you home after the procedure. What happens during the procedure?  You may need to remove your clothing and dress in an open-back gown.  The procedure will be done while you are lying on an X-ray table. You will most likely be asked to lie on your stomach, but you may be asked to lie in a different position if an injection will be made in your neck.  Machines will be used to monitor your oxygen levels, heart rate, and blood pressure.  If an injection will be made in your neck, an IV tube will be inserted into one of your veins. Fluids and medicine will flow directly into your body through the IV tube.  The area over the facet joint where the injection will be made will be cleaned with soap. The surrounding skin will be covered with clean drapes.  A numbing medicine (local anesthetic) will be applied to your skin. Your skin may sting or burn for a moment.  A video X-ray machine (fluoroscopy) will be used to locate the joint. In some cases, a CT scan may  be used.  A contrast dye may be injected into the facet joint area to help locate the joint.  When the joint is located, an anesthetic will be injected into the joint through the needle.  Your health care provider will ask you whether you feel pain relief. If you do feel relief, a  steroid may be injected to provide pain relief for a longer period of time. If you do not feel relief or feel only partial relief, additional injections of an anesthetic may be made in other facet joints.  The needle will be removed.  Your skin will be cleaned.  A bandage (dressing) will be applied over each injection site. The procedure may vary among health care providers and hospitals. What happens after the procedure?  You will be observed for 15-30 minutes before being allowed to go home. This information is not intended to replace advice given to you by your health care provider. Make sure you discuss any questions you have with your health care provider. Document Released: 08/04/2006 Document Revised: 04/16/2015 Document Reviewed: 12/09/2014 Elsevier Interactive Patient Education  2017 [employer].

## 2016-08-19 NOTE — Patient Instructions (Signed)
I will let Dr. Janese Banks know what you are wanting to do.

## 2016-08-19 NOTE — Progress Notes (Signed)
Nursing Pain Medication Assessment:  Safety precautions to be maintained throughout the outpatient stay will include: orient to surroundings, keep bed in low position, maintain call bell within reach at all times, provide assistance with transfer out of bed and ambulation.  Medication Inspection Compliance: Pill count conducted under aseptic conditions, in front of the patient. Neither the pills nor the bottle was removed from the patient's sight at any time. Once count was completed pills were immediately returned to the patient in their original bottle.  Medication: Oxycodone IR Pill/Patch Count: 23 of 120 pills remain Pill/Patch Appearance: Markings consistent with prescribed medication Bottle Appearance: Standard pharmacy container. Clearly labeled. Filled Date 04 / 30 / 2018 Last Medication intake:  Today

## 2016-08-19 NOTE — Progress Notes (Signed)
Patient ID: Audrey Garrett, female   DOB: 08/08/1960, 56 y.o.   MRN: [id number]  Chief Complaint  Patient presents with  . New Patient (Initial Visit)    Bliateral Multiple Lung Nodules-referred by Dr.Rao    Referred By Dr. Janese Banks Reason for Referral right upper lobe mass  HPI Location, Quality, Duration, Severity, Timing, Context, Modifying Factors, Associated Signs and Symptoms.  Audrey Garrett is a 56 y.o. female.  She has an extensive past medical history consisting of hepatitis C and alcohol abuse leading to cirrhosis. Her problems began when she was evaluated for iron deficiency anemia. She's had multiple blood transfusions over the last couple years and she's been followed by our hematologist Dr. Janese Banks. Because of her extensive past medical history of smoking she underwent a screening CT. This revealed a right upper lobe mass as well as multiple scattered bilateral pulmonary nodules. She then underwent a PET scan which revealed slight uptake in the right upper lobe nodule consistent with either inflammation or malignancy. The patient was seen here today. She has other significant history of multiple spine surgeries and fusions as well as an umbilical hernia repair. She states she's been treated for hepatitis C and is currently free of disease. She was evaluated at [facility] for liver transplant but it was felt that she perhaps was too well for that at this point in time. She does continue to smoke at least a half a pack cigarettes a day. She quit drinking one year ago.   Past Medical History:  Diagnosis Date  . Alcohol abuse   . 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 Anemia   . Anxiety   . Arthropathy   . Back pain   . Bronchitis   . BRONCHITIS, ACUTE 11/01/2009   Qualifier: Diagnosis of  By: Alveta Heimlich MD, Cornelia Copa    . Chronic hepatitis C ([place])   . Chronic LBP 12/25/2014   Overview:  Post extensive surgery   . Cirrhosis ([place])   . DDD (degenerative disc disease), lumbar 01/01/2015  .  Depression   . Diabetes mellitus without complication ([place])   . Diverticulosis   . Edema   . Fatigue   . Fibromyalgia   . GERD (gastroesophageal reflux disease)    gastroparesis  . High risk medications (not anticoagulants) long-term use   . Hyperglycemia   . Hypertension   . Kidney mass    11/17 small mass-  . Left leg pain 01/01/2015  . Lumbago   . Lumbar radicular pain 01/01/2015  . Muscle spasm   . Neck pain 01/01/2015  . Polyarthritis   . Reflux   . Restless leg syndrome    01/2016  . RLS (restless legs syndrome)   . Sacroiliac pain 01/01/2015  . Shoulder pain   . Sinusitis   . SOB (shortness of breath)   . Spine disorder   . Stress headaches   . Upper back pain 01/01/2015  . Urinary frequency   . Vitamin D deficiency disease     Past Surgical History:  Procedure Laterality Date  . BACK SURGERY  12/19/2011  . BACK SURGERY    . COLONOSCOPY WITH PROPOFOL N/A 09/25/2015   Procedure: COLONOSCOPY WITH PROPOFOL;  Surgeon: Lollie Sails, MD;  Location: [facility] ENDOSCOPY;  Service: Endoscopy;  Laterality: N/A;  . COLONOSCOPY WITH PROPOFOL N/A 04/01/2016   Procedure: COLONOSCOPY WITH PROPOFOL;  Surgeon: Lollie Sails, MD;  Location: [facility] ENDOSCOPY;  Service: Endoscopy;  Laterality: N/A;  . ESOPHAGOGASTRODUODENOSCOPY  N/A 04/14/2015   Procedure: ESOPHAGOGASTRODUODENOSCOPY (EGD);  Surgeon: Lollie Sails, MD;  Location: [facility] ENDOSCOPY;  Service: Endoscopy;  Laterality: N/A;  . ESOPHAGOGASTRODUODENOSCOPY (EGD) WITH PROPOFOL N/A 09/16/2015   Procedure: ESOPHAGOGASTRODUODENOSCOPY (EGD) WITH PROPOFOL;  Surgeon: Lollie Sails, MD;  Location: [facility] ENDOSCOPY;  Service: Endoscopy;  Laterality: N/A;  . ESOPHAGOGASTRODUODENOSCOPY (EGD) WITH PROPOFOL N/A 01/22/2016   Procedure: ESOPHAGOGASTRODUODENOSCOPY (EGD) WITH PROPOFOL;  Surgeon: [name], MD;  Location: [facility];  Service: Endoscopy;  Laterality: N/A;  . ESOPHAGOGASTRODUODENOSCOPY (EGD) WITH PROPOFOL N/A 04/01/2016    Procedure: ESOPHAGOGASTRODUODENOSCOPY (EGD) WITH PROPOFOL;  Surgeon: Lollie Sails, MD;  Location[facility] ENDOSCOPY;  Service: Endoscopy;  Laterality: N/A;  . HERNIA REPAIR N/A 10/2015   abdominal   . TONSILLECTOMY    . TUBAL LIGATION    . UMBILICAL HERNIA REPAIR N/A 11/06/2015   Procedure: HERNIA REPAIR UMBILICAL ADULT;  Surgeon: [name], MD;  Location: [facility] ORS;  Service: General;  Laterality: N/A;    Family History  Problem Relation Age of Onset  . Stroke Mother   . Heart disease Mother   . [name] Father   . Breast cancer Sister 55  . Kidney disease Neg Hx   . Bladder Cancer Neg Hx   . Kidney cancer Neg Hx   . Prostate cancer Neg Hx     Social History Social History  Substance Use Topics  . Smoking status: Current Every Day Smoker    Packs/day: 2.00    Years: 42.00    Types: Cigarettes  . Smokeless tobacco: Never Used     Comment: 2ppd until last 2 years  . Alcohol use No     Comment: stopped drinking 05/12/15    No Known Allergies  Current Outpatient Prescriptions  Medication Sig Dispense Refill  . calcium carbonate (TUMS - DOSED IN MG ELEMENTAL CALCIUM) 500 MG chewable tablet Chew 2 tablets by mouth 2 (two) times daily.    . furosemide (LASIX) 40 MG tablet Take by mouth 2 (two) times daily.     Marland Kitchen lactulose (CHRONULAC) 10 GM/15ML solution Take 10 g by mouth 3 (three) times daily.     . Magnesium Oxide 250 MG TABS Take 1 tablet by mouth daily.    . Multiple Vitamin (MULTIVITAMIN WITH MINERALS) TABS tablet Take 1 tablet by mouth daily.    . pantoprazole (PROTONIX) 40 MG tablet TAKE 1 TABLET (40 MG TOTAL) BY MOUTH 2 (TWO) TIMES DAILY.  11  . PARoxetine (PAXIL) 40 MG tablet Take 40 mg by mouth every morning.  2  . phytonadione (VITAMIN K) 5 MG tablet Take 5 mg by mouth 3 (three) times a week. Mon, Wed, Fri    . pregabalin (LYRICA) 75 MG capsule Take 75 mg by mouth 2 (two) times daily.    Marland Kitchen spironolactone (ALDACTONE) 50 MG tablet Take 100 mg by mouth daily.    0  . sucralfate (CARAFATE) 1 GM/10ML suspension Take 1 g by mouth 3 (three) times daily as needed.     . thiamine (VITAMIN B-1) 100 MG tablet Take 1 tablet (100 mg total) by mouth daily. 90 tablet 6  . traZODone (DESYREL) 50 MG tablet Take 50 mg by mouth at bedtime.    . Oxycodone HCl 20 MG TABS Take 1 tablet (20 mg total) by mouth every 6 (six) hours. 120 tablet 0   No current facility-administered medications for this visit.       Review of Systems A complete review of systems was  asked and was negative except for the following positive findingsWeight loss, double vision, joint pain, cyst on her back, depression, excessive thirst, easy bruising.  Blood pressure 111/70, pulse 76, temperature 98.3 F (36.8 C), temperature source Oral, height 5\' 10"  (1.778 m), weight 159 lb 9.6 oz (72.4 kg).  Physical Exam CONSTITUTIONAL:  Pleasant, well-developed, well-nourished, and in no acute distress. EYES: Pupils equal and reactive to light, Sclera non-icteric EARS, NOSE, MOUTH AND THROAT:  The oropharynx was clear.  Dentition is good repair.  Oral mucosa pink and moist. LYMPH NODES:  Lymph nodes in the neck and axillae were normal RESPIRATORY:  Lungs were clear.  Normal respiratory effort without pathologic use of accessory muscles of respiration CARDIOVASCULAR: Heart was regular without murmurs.  There were no carotid bruits. GI: The abdomen was soft, nontender, and slightly distended. There were no palpable masses. There was no hepatosplenomegaly. There were normal bowel sounds in all quadrants. GU:  Rectal deferred.   MUSCULOSKELETAL:  Normal muscle strength and tone.  No clubbing or cyanosis.   SKIN:  There were no pathologic skin lesions.  There were no nodules on palpation.   Midportion of her back along her incision there is a crusted area which she states is where a prior sebaceous cyst had been removed. NEUROLOGIC:  Sensation is normal.  Cranial nerves are grossly intact. PSYCH:  Oriented  to person, place and time.  Mood and affect are normal.  Data Reviewed CT scans and PET scans.  I have personally reviewed the patient's imaging, laboratory findings and medical records.    Assessment    I had a long discussion with her regarding the possibilities. These would include infectious or inflammatory or malignant processes.    Plan    I reviewed with her multiple options including a repeat CT in 3 months, navigational bronchoscopy, percutaneous lung biopsy and/or surgical resection. The location of the lesion is in question. It may be upper lobe or middle lobe. In any event she does not want any surgical procedures performed. She states that she would only like to have a CT scan repeated in 3 months. She is scheduled to see Dr. Mayer Camel hematology in about 2 months and she will obtain the CT of her chest shortly thereafter. All of her questions were answered.      Nestor Lewandowsky, MD 08/19/2016, 11:57 AM

## 2016-08-20 ENCOUNTER — Ambulatory Visit: Payer: 59 | Admitting: Oncology

## 2016-08-20 ENCOUNTER — Ambulatory Visit: Payer: Medicare Other | Admitting: Cardiothoracic Surgery

## 2016-08-20 ENCOUNTER — Telehealth: Payer: Self-pay | Admitting: General Practice

## 2016-08-20 NOTE — Telephone Encounter (Signed)
Error

## 2016-08-20 NOTE — Telephone Encounter (Signed)
Patient called she said would like to go through with scheduling the biopsy, but would like to talk with you before you schedule this appointment for her. Please call patient and advice.

## 2016-08-20 NOTE — Telephone Encounter (Signed)
error 

## 2016-08-20 NOTE — Telephone Encounter (Signed)
Called patient back and she wanted to discuss the different procedures that she is able to have. However, after talking to the patient, she decided on doing the navigational bronchoscopy. I told her that I would have to send the referral to Dr. Mortimer Fries (Pulmonologist) so they could contact her and schedule an appointment and the procedure. I told patient that if she did not hear from them by Wednesday (08/25/2016), to please let me know so I could call.  Patient understood and had no further questions.

## 2016-08-26 ENCOUNTER — Telehealth: Payer: Self-pay | Admitting: Internal Medicine

## 2016-08-26 NOTE — Telephone Encounter (Signed)
Schedule pt at 8:30am on 09/02/16 with DK. He has a 30 minute slot.

## 2016-08-26 NOTE — Telephone Encounter (Signed)
States this pt needs urgent appt for biopsy for lung nodule. 11.6 mm right lung nodule. Please advise.

## 2016-08-30 ENCOUNTER — Ambulatory Visit: Payer: Medicare Other

## 2016-08-30 ENCOUNTER — Ambulatory Visit: Payer: Medicare Other | Admitting: Pain Medicine

## 2016-09-02 ENCOUNTER — Institutional Professional Consult (permissible substitution): Payer: 59 | Admitting: Internal Medicine

## 2016-09-03 ENCOUNTER — Other Ambulatory Visit: Payer: Self-pay | Admitting: Gastroenterology

## 2016-09-03 DIAGNOSIS — K7031 Alcoholic cirrhosis of liver with ascites: Secondary | ICD-10-CM

## 2016-09-06 ENCOUNTER — Encounter: Payer: Self-pay | Admitting: Pain Medicine

## 2016-09-06 ENCOUNTER — Ambulatory Visit: Payer: Medicare Other | Admitting: Pain Medicine

## 2016-09-06 ENCOUNTER — Ambulatory Visit
Admission: RE | Admit: 2016-09-06 | Discharge: 2016-09-06 | Disposition: A | Discharge status: Home / Self Care | Payer: Medicare Other | Source: Ambulatory Visit | Attending: Pain Medicine | Admitting: Pain Medicine

## 2016-09-06 VITALS — BP 103/67 | HR 73 | Temp 98.0°F | Resp 20 | Ht 70.0 in | Wt 170.0 lb

## 2016-09-06 DIAGNOSIS — M5442 Lumbago with sciatica, left side: Secondary | ICD-10-CM

## 2016-09-06 DIAGNOSIS — M4696 Unspecified inflammatory spondylopathy, lumbar region: Secondary | ICD-10-CM | POA: Insufficient documentation

## 2016-09-06 DIAGNOSIS — M47816 Spondylosis without myelopathy or radiculopathy, lumbar region: Secondary | ICD-10-CM

## 2016-09-06 DIAGNOSIS — G8929 Other chronic pain: Secondary | ICD-10-CM | POA: Diagnosis not present

## 2016-09-06 DIAGNOSIS — M533 Sacrococcygeal disorders, not elsewhere classified: Secondary | ICD-10-CM | POA: Insufficient documentation

## 2016-09-06 MED ORDER — ROPIVACAINE HCL 2 MG/ML IJ SOLN
25.0000 mg | Freq: Once | INTRAMUSCULAR | Status: DC
  Filled 2016-09-06: qty 20

## 2016-09-06 MED ORDER — MIDAZOLAM HCL 5 MG/5ML IJ SOLN
1.0000 mg | INTRAMUSCULAR | Status: DC | PRN
  Administered 2016-09-06: 5 mg via INTRAVENOUS
  Filled 2016-09-06: qty 5

## 2016-09-06 MED ORDER — LACTATED RINGERS IV SOLN
1000.0000 mL | Freq: Once | INTRAVENOUS | Status: AC
  Administered 2016-09-06: 1000 mL via INTRAVENOUS

## 2016-09-06 MED ORDER — FENTANYL CITRATE (PF) 100 MCG/2ML IJ SOLN
25.0000 ug | INTRAMUSCULAR | Status: DC | PRN
  Administered 2016-09-06: 100 ug via INTRAVENOUS
  Filled 2016-09-06: qty 2

## 2016-09-06 MED ORDER — ROPIVACAINE HCL 2 MG/ML IJ SOLN
9.0000 mL | Freq: Once | INTRAMUSCULAR | Status: AC
  Administered 2016-09-06: 10 mL via INTRA_ARTICULAR

## 2016-09-06 MED ORDER — LIDOCAINE HCL (PF) 1 % IJ SOLN
10.0000 mL | Freq: Once | INTRAMUSCULAR | Status: DC
  Filled 2016-09-06: qty 10

## 2016-09-06 MED ORDER — ROPIVACAINE HCL 2 MG/ML IJ SOLN
9.0000 mL | Freq: Once | INTRAMUSCULAR | Status: AC
  Administered 2016-09-06: 10 mL via INTRA_ARTICULAR
  Filled 2016-09-06: qty 10

## 2016-09-06 MED ORDER — LIDOCAINE HCL (PF) 1 % IJ SOLN
10.0000 mL | Freq: Once | INTRAMUSCULAR | Status: AC
  Administered 2016-09-06: 5 mL

## 2016-09-06 MED ORDER — ROPIVACAINE HCL 2 MG/ML IJ SOLN
9.0000 mL | Freq: Once | INTRAMUSCULAR | Status: AC
  Administered 2016-09-06: 10 mL via PERINEURAL
  Filled 2016-09-06: qty 10

## 2016-09-06 MED ORDER — LIDOCAINE HCL (PF) 1 % IJ SOLN
10.0000 mL | Freq: Once | INTRAMUSCULAR | Status: AC
  Administered 2016-09-06: 10 mL

## 2016-09-06 MED ORDER — METHYLPREDNISOLONE ACETATE 80 MG/ML IJ SUSP
80.0000 mg | Freq: Once | INTRAMUSCULAR | Status: AC
  Administered 2016-09-06: 80 mg via INTRA_ARTICULAR
  Filled 2016-09-06: qty 1

## 2016-09-06 MED ORDER — TRIAMCINOLONE ACETONIDE 40 MG/ML IJ SUSP
40.0000 mg | Freq: Once | INTRAMUSCULAR | Status: AC
  Administered 2016-09-06: 40 mg
  Filled 2016-09-06: qty 1

## 2016-09-06 MED ORDER — TRIAMCINOLONE ACETONIDE 40 MG/ML IJ SUSP
40.0000 mg | Freq: Once | INTRAMUSCULAR | Status: AC
  Administered 2016-09-06: 40 mg

## 2016-09-06 NOTE — Patient Instructions (Signed)
Pain Management Discharge Instructions  General Discharge Instructions :  If you need to reach your doctor call: Monday-Friday 8:00 am - 4:00 pm at [phone] or toll free [phone].  After clinic hours [phone] to have operator reach doctor.  Bring all of your medication bottles to all your appointments in the pain clinic.  To cancel or reschedule your appointment with Pain Management please remember to call 24 hours in advance to avoid a fee.  Refer to the educational materials which you have been given on: General Risks, I had my Procedure. Discharge Instructions, Post Sedation.  Post Procedure Instructions:  The drugs you were given will stay in your system until tomorrow, so for the next 24 hours you should not drive, make any legal decisions or drink any alcoholic beverages.  You may eat anything you prefer, but it is better to start with liquids then soups and crackers, and gradually work up to solid foods.  Please notify your doctor immediately if you have any unusual bleeding, trouble breathing or pain that is not related to your normal pain.  Depending on the type of procedure that was done, some parts of your body may feel week and/or numb.  This usually clears up by tonight or the next day.  Walk with the use of an assistive device or accompanied by an adult for the 24 hours.  You may use ice on the affected area for the first 24 hours.  Put ice in a Ziploc bag and cover with a towel and place against area 15 minutes on 15 minutes off.  You may switch to heat after 24 hours.GENERAL RISKS AND COMPLICATIONS  What are the risk, side effects and possible complications? Generally speaking, most procedures are safe.  However, with any procedure there are risks, side effects, and the possibility of complications.  The risks and complications are dependent upon the sites that are lesioned, or the type of nerve block to be performed.  The closer the procedure is to the spine,  the more serious the risks are.  Great care is taken when placing the radio frequency needles, block needles or lesioning probes, but sometimes complications can occur. 1. Infection: Any time there is an injection through the skin, there is a risk of infection.  This is why sterile conditions are used for these blocks.  There are four possible types of infection. 1. Localized skin infection. 2. Central Nervous System Infection-This can be in the form of Meningitis, which can be deadly. 3. Epidural Infections-This can be in the form of an epidural abscess, which can cause pressure inside of the spine, causing compression of the spinal cord with subsequent paralysis. This would require an emergency surgery to decompress, and there are no guarantees that the patient would recover from the paralysis. 4. Discitis-This is an infection of the intervertebral discs.  It occurs in about 1% of discography procedures.  It is difficult to treat and it may lead to surgery.        2. Pain: the needles have to go through skin and soft tissues, will cause soreness.       3. Damage to internal structures:  The nerves to be lesioned may be near blood vessels or    other nerves which can be potentially damaged.       4. Bleeding: Bleeding is more common if the patient is taking blood thinners such as  aspirin, Coumadin, Ticiid, Plavix, etc., or if he/she have some genetic predisposition  such as   hemophilia. Bleeding into the spinal canal can cause compression of the spinal  cord with subsequent paralysis.  This would require an emergency surgery to  decompress and there are no guarantees that the patient would recover from the  paralysis.       5. Pneumothorax:  Puncturing of a lung is a possibility, every time a needle is introduced in  the area of the chest or upper back.  Pneumothorax refers to free air around the  collapsed lung(s), inside of the thoracic cavity (chest cavity).  Another two possible  complications  related to a similar event would include: Hemothorax and Chylothorax.   These are variations of the Pneumothorax, where instead of air around the collapsed  lung(s), you may have blood or chyle, respectively.       6. Spinal headaches: They may occur with any procedures in the area of the spine.       7. Persistent CSF (Cerebro-Spinal Fluid) leakage: This is a rare problem, but may occur  with prolonged intrathecal or epidural catheters either due to the formation of a fistulous  track or a dural tear.       8. Nerve damage: By working so close to the spinal cord, there is always a possibility of  nerve damage, which could be as serious as a permanent spinal cord injury with  paralysis.       9. Death:  Although rare, severe deadly allergic reactions known as "Anaphylactic  reaction" can occur to any of the medications used.      10. Worsening of the symptoms:  We can always make thing worse.  What are the chances of something like this happening? Chances of any of this occuring are extremely low.  By statistics, you have more of a chance of getting killed in a motor vehicle accident: while driving to the hospital than any of the above occurring .  Nevertheless, you should be aware that they are possibilities.  In general, it is similar to taking a shower.  Everybody knows that you can slip, hit your head and get killed.  Does that mean that you should not shower again?  Nevertheless always keep in mind that statistics do not mean anything if you happen to be on the wrong side of them.  Even if a procedure has a 1 (one) in a 1,000,000 (million) chance of going wrong, it you happen to be that one..Also, keep in mind that by statistics, you have more of a chance of having something go wrong when taking medications.  Who should not have this procedure? If you are on a blood thinning medication (e.g. Coumadin, Plavix, see list of "Blood Thinners"), or if you have an active infection going on, you should not  have the procedure.  If you are taking any blood thinners, please inform your physician.  How should I prepare for this procedure?  Do not eat or drink anything at least six hours prior to the procedure.  Bring a driver with you .  It cannot be a taxi.  Come accompanied by an adult that can drive you back, and that is strong enough to help you if your legs get weak or numb from the local anesthetic.  Take all of your medicines the morning of the procedure with just enough water to swallow them.  If you have diabetes, make sure that you are scheduled to have your procedure done first thing in the morning, whenever possible.  If you have diabetes,   take only half of your insulin dose and notify our nurse that you have done so as soon as you arrive at the clinic.  If you are diabetic, but only take blood sugar pills (oral hypoglycemic), then do not take them on the morning of your procedure.  You may take them after you have had the procedure.  Do not take aspirin or any aspirin-containing medications, at least eleven (11) days prior to the procedure.  They may prolong bleeding.  Wear loose fitting clothing that may be easy to take off and that you would not mind if it got stained with Betadine or blood.  Do not wear any jewelry or perfume  Remove any nail coloring.  It will interfere with some of our monitoring equipment.  NOTE: Remember that this is not meant to be interpreted as a complete list of all possible complications.  Unforeseen problems may occur.  BLOOD THINNERS The following drugs contain aspirin or other products, which can cause increased bleeding during surgery and should not be taken for 2 weeks prior to and 1 week after surgery.  If you should need take something for relief of minor pain, you may take acetaminophen which is found in Tylenol,m Datril, Anacin-3 and Panadol. It is not blood thinner. The products listed below are.  Do not take any of the products listed below  in addition to any listed on your instruction sheet.  A.P.C or A.P.C with Codeine Codeine Phosphate Capsules #3 Ibuprofen Ridaura  ABC compound Congesprin Imuran rimadil  Advil Cope Indocin Robaxisal  Alka-Seltzer Effervescent Pain Reliever and Antacid Coricidin or Coricidin-D  Indomethacin Rufen  Alka-Seltzer plus Cold Medicine Cosprin Ketoprofen S-A-C Tablets  Anacin Analgesic Tablets or Capsules Coumadin Korlgesic Salflex  Anacin Extra Strength Analgesic tablets or capsules CP-2 Tablets Lanoril Salicylate  Anaprox Cuprimine Capsules Levenox Salocol  Anexsia-D Dalteparin Magan Salsalate  Anodynos Darvon compound Magnesium Salicylate Sine-off  Ansaid Dasin Capsules Magsal Sodium Salicylate  Anturane Depen Capsules Marnal Soma  APF Arthritis pain formula Dewitt's Pills Measurin Stanback  Argesic Dia-Gesic Meclofenamic Sulfinpyrazone  Arthritis Bayer Timed Release Aspirin Diclofenac Meclomen Sulindac  Arthritis pain formula Anacin Dicumarol Medipren Supac  Analgesic (Safety coated) Arthralgen Diffunasal Mefanamic Suprofen  Arthritis Strength Bufferin Dihydrocodeine Mepro Compound Suprol  Arthropan liquid Dopirydamole Methcarbomol with Aspirin Synalgos  ASA tablets/Enseals Disalcid Micrainin Tagament  Ascriptin Doan's Midol Talwin  Ascriptin A/D Dolene Mobidin Tanderil  Ascriptin Extra Strength Dolobid Moblgesic Ticlid  Ascriptin with Codeine Doloprin or Doloprin with Codeine Momentum Tolectin  Asperbuf Duoprin Mono-gesic Trendar  Aspergum Duradyne Motrin or Motrin IB Triminicin  Aspirin plain, buffered or enteric coated Durasal Myochrisine Trigesic  Aspirin Suppositories Easprin Nalfon Trillsate  Aspirin with Codeine Ecotrin Regular or Extra Strength Naprosyn Uracel  Atromid-S Efficin Naproxen Ursinus  Auranofin Capsules Elmiron Neocylate Vanquish  Axotal Emagrin Norgesic Verin  Azathioprine Empirin or Empirin with Codeine Normiflo Vitamin E  Azolid Emprazil Nuprin Voltaren  Bayer  Aspirin plain, buffered or children's or timed BC Tablets or powders Encaprin Orgaran Warfarin Sodium  Buff-a-Comp Enoxaparin Orudis Zorpin  Buff-a-Comp with Codeine Equegesic Os-Cal-Gesic   Buffaprin Excedrin plain, buffered or Extra Strength Oxalid   Bufferin Arthritis Strength Feldene Oxphenbutazone   Bufferin plain or Extra Strength Feldene Capsules Oxycodone with Aspirin   Bufferin with Codeine Fenoprofen Fenoprofen Pabalate or Pabalate-SF   Buffets II Flogesic Panagesic   Buffinol plain or Extra Strength Florinal or Florinal with Codeine Panwarfarin   Buf-Tabs Flurbiprofen Penicillamine   Butalbital Compound Four-way cold tablets   Penicillin   Butazolidin Fragmin Pepto-Bismol   Carbenicillin Geminisyn Percodan   Carna Arthritis Reliever Geopen Persantine   Carprofen Gold's salt Persistin   Chloramphenicol Goody's Phenylbutazone   Chloromycetin Haltrain Piroxlcam   Clmetidine heparin Plaquenil   Cllnoril Hyco-pap Ponstel   Clofibrate Hydroxy chloroquine Propoxyphen         Before stopping any of these medications, be sure to consult the physician who ordered them.  Some, such as Coumadin (Warfarin) are ordered to prevent or treat serious conditions such as "deep thrombosis", "pumonary embolisms", and other heart problems.  The amount of time that you may need off of the medication may also vary with the medication and the reason for which you were taking it.  If you are taking any of these medications, please make sure you notify your pain physician before you undergo any procedures.         Facet Blocks Patient Information  Description: The facets are joints in the spine between the vertebrae.  Like any joints in the body, facets can become irritated and painful.  Arthritis can also effect the facets.  By injecting steroids and local anesthetic in and around these joints, we can temporarily block the nerve supply to them.  Steroids act directly on irritated nerves and tissues  to reduce selling and inflammation which often leads to decreased pain.  Facet blocks may be done anywhere along the spine from the neck to the low back depending upon the location of your pain.   After numbing the skin with local anesthetic (like Novocaine), a small needle is passed onto the facet joints under x-ray guidance.  You may experience a sensation of pressure while this is being done.  The entire block usually lasts about 15-25 minutes.   Conditions which may be treated by facet blocks:   Low back/buttock pain  Neck/shoulder pain  Certain types of headaches  Preparation for the injection:  1. Do not eat any solid food or dairy products within 8 hours of your appointment. 2. You may drink clear liquid up to 3 hours before appointment.  Clear liquids include water, black coffee, juice or soda.  No milk or cream please. 3. You may take your regular medication, including pain medications, with a sip of water before your appointment.  Diabetics should hold regular insulin (if taken separately) and take 1/2 normal NPH dose the morning of the procedure.  Carry some sugar containing items with you to your appointment. 4. A driver must accompany you and be prepared to drive you home after your procedure. 5. Bring all your current medications with you. 6. An IV may be inserted and sedation may be given at the discretion of the physician. 7. A blood pressure cuff, EKG and other monitors will often be applied during the procedure.  Some patients may need to have extra oxygen administered for a short period. 8. You will be asked to provide medical information, including your allergies and medications, prior to the procedure.  We must know immediately if you are taking blood thinners (like Coumadin/Warfarin) or if you are allergic to IV iodine contrast (dye).  We must know if you could possible be pregnant.  Possible side-effects:   Bleeding from needle site  Infection (rare, may require  surgery)  Nerve injury (rare)  Numbness & tingling (temporary)  Difficulty urinating (rare, temporary)  Spinal headache (a headache worse with upright posture)  Light-headedness (temporary)  Pain at injection site (serveral days)  Decreased blood pressure (rare,  temporary)  Weakness in arm/leg (temporary)  Pressure sensation in back/neck (temporary)   Call if you experience:   Fever/chills associated with headache or increased back/neck pain  Headache worsened by an upright position  New onset, weakness or numbness of an extremity below the injection site  Hives or difficulty breathing (go to the emergency room)  Inflammation or drainage at the injection site(s)  Severe back/neck pain greater than usual  New symptoms which are concerning to you  Please note:  Although the local anesthetic injected can often make your back or neck feel good for several hours after the injection, the pain will likely return. It takes 3-7 days for steroids to work.  You may not notice any pain relief for at least one week.  If effective, we will often do a series of 2-3 injections spaced 3-6 weeks apart to maximally decrease your pain.  After the initial series, you may be a candidate for a more permanent nerve block of the facets.  If you have any questions, please call #[phone]) [facility]

## 2016-09-06 NOTE — Progress Notes (Signed)
[name] Name: Audrey Garrett  MRN: [id number]  Referring Provider: Jodi Marble, MD  DOB: 1960-11-14  PCP: Jodi Marble, MD  DOS: 09/06/2016  Note by: Kathlen Brunswick. Dossie Arbour, MD  Service setting: Ambulatory outpatient  Location: [facility] (AMB) Pain Management Facility  Visit type: Procedure  Specialty: Interventional Pain Management  Patient type: Established   Primary Reason for Visit: Interventional Pain Management Treatment. CC: Back Pain (low)  Procedure:  Anesthesia, Analgesia, Anxiolysis:  Procedure #1: Type: Diagnostic Medial Branch Facet Block Region: Lumbar Level: L2, L3, L4, L5, & S1 Medial Branch Level(s) Laterality: Bilateral  Procedure #2: Type: Diagnostic Sacroiliac Joint Block Region: Posterior Lumbosacral Level: PSIS (Posterior Superior Iliac Spine) Sacroiliac Joint Laterality: Bilateral  Type: Local Anesthesia with Moderate (Conscious) Sedation Local Anesthetic: Lidocaine 1% Route: Intravenous (IV) IV Access: Secured Sedation: Meaningful verbal contact was maintained at all times during the procedure  Indication(s): Analgesia and Anxiety  Indications: 1. Lumbar facet syndrome (Bilateral) (L>R)   2. 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 Lumbar spondylosis   4. Chronic low back pain (Location of Primary Source of Pain) (Bilateral) (L>R)    Pain Score: Pre-procedure: 10-Worst pain ever/10 Post-procedure: 0-No pain/10  Pre-op Assessment:  Previous date of service:   Service provided:   Audrey Garrett is a 56 y.o. (year old), female patient, seen today for interventional treatment. She  has a past surgical history that includes Tubal ligation; Tonsillectomy; Back surgery (12/19/2011); Esophagogastroduodenoscopy (N/A, 04/14/2015); Esophagogastroduodenoscopy (egd) with propofol (N/A, 09/16/2015); Colonoscopy with propofol (N/A, 09/25/2015); Umbilical hernia repair (N/A, 11/06/2015); Hernia repair (N/A, 10/2015); Esophagogastroduodenoscopy (egd) with propofol (N/A,  01/22/2016); Back surgery; Colonoscopy with propofol (N/A, 04/01/2016); and Esophagogastroduodenoscopy (egd) with propofol (N/A, 04/01/2016). Her primarily concern today is the Back Pain (low)  Initial Vital Signs: Blood pressure 105/64, pulse 73, temperature 98.7 F (37.1 C), resp. rate 16, height 5\' 10"  (1.778 m), weight 170 lb (77.1 kg), SpO2 98 %. BMI: 24.39 kg/m  Risk Assessment: Allergies: Reviewed. She has No Known Allergies.  Allergy Precautions: None required Coagulopathies: Reviewed. None identified.  Blood-thinner therapy: None at this time Active Infection(s): Reviewed. None identified. Audrey Garrett is afebrile  Site Confirmation: Audrey Garrett was asked to confirm the procedure and laterality before marking the site Procedure checklist: Completed Consent: Before the procedure and under the influence of no sedative(s), amnesic(s), or anxiolytics, the patient was informed of the treatment options, risks and possible complications. To fulfill our ethical and legal obligations, as recommended by the American Medical Association's Code of Ethics, I have informed the patient of my clinical impression; the nature and purpose of the treatment or procedure; the risks, benefits, and possible complications of the intervention; the alternatives, including doing nothing; the risk(s) and benefit(s) of the alternative treatment(s) or procedure(s); and the risk(s) and benefit(s) of doing nothing. The patient was provided information about the general risks and possible complications associated with the procedure. These may include, but are not limited to: failure to achieve desired goals, infection, bleeding, organ or nerve damage, allergic reactions, paralysis, and death. In addition, the patient was informed of those risks and complications associated to Spine-related procedures, such as failure to decrease pain; infection (i.e.: Meningitis, epidural or intraspinal abscess); bleeding (i.e.: epidural  hematoma, subarachnoid hemorrhage, or any other type of intraspinal or peri-dural bleeding); organ or nerve damage (i.e.: Any type of peripheral nerve, nerve root, or spinal cord injury) with subsequent damage to sensory, motor, and/or autonomic systems, resulting in permanent pain, numbness, and/or  weakness of one or several areas of the body; allergic reactions; (i.e.: anaphylactic reaction); and/or death. Furthermore, the patient was informed of those risks and complications associated with the medications. These include, but are not limited to: allergic reactions (i.e.: anaphylactic or anaphylactoid reaction(s)); adrenal axis suppression; blood sugar elevation that in diabetics may result in ketoacidosis or comma; water retention that in patients with history of congestive heart failure may result in shortness of breath, pulmonary edema, and decompensation with resultant heart failure; weight gain; swelling or edema; medication-induced neural toxicity; particulate matter embolism and blood vessel occlusion with resultant organ, and/or nervous system infarction; and/or aseptic necrosis of one or more joints. Finally, the patient was informed that Medicine is not an exact science; therefore, there is also the possibility of unforeseen or unpredictable risks and/or possible complications that may result in a catastrophic outcome. The patient indicated having understood very clearly. We have given the patient no guarantees and we have made no promises. Enough time was given to the patient to ask questions, all of which were answered to the patient's satisfaction. Audrey Garrett has indicated that she wanted to continue with the procedure. Attestation: I, the ordering provider, attest that I have discussed with the patient the benefits, risks, side-effects, alternatives, likelihood of achieving goals, and potential problems during recovery for the procedure that I have provided informed consent. Date: 09/06/2016;  Time: 10:13 AM  Pre-Procedure Preparation:  Monitoring: As per clinic protocol. Respiration, ETCO2, SpO2, BP, heart rate and rhythm monitor placed and checked for adequate function Safety Precautions: Patient was assessed for positional comfort and pressure points before starting the procedure. Time-out: I initiated and conducted the "Time-out" before starting the procedure, as per protocol. The patient was asked to participate by confirming the accuracy of the "Time Out" information. Verification of the correct person, site, and procedure were performed and confirmed by me, the nursing staff, and the patient. "Time-out" conducted as per Joint Commission's Universal Protocol (UP.01.01.01). "Time-out" Date & Time: 09/06/2016; 1106 hrs.  Description of Procedure #1 Process:   Time-out: "Time-out" completed before starting procedure, as per protocol. Position: Prone Target Area: For Lumbar Facet blocks, the target is the groove formed by the junction of the transverse process and superior articular process. For the L5 dorsal ramus, the target is the notch between superior articular process and sacral ala. For the S1 dorsal ramus, the target is the superior and lateral edge of the posterior S1 Sacral foramen. Approach: Paramedial approach. Area Prepped: Entire Posterior Lumbosacral Region Prepping solution: ChloraPrep (2% chlorhexidine gluconate and 70% isopropyl alcohol) Safety Precautions: Aspiration looking for blood return was conducted prior to all injections. At no point did we inject any substances, as a needle was being advanced. No attempts were made at seeking any paresthesias. Safe injection practices and needle disposal techniques used. Medications properly checked for expiration dates. SDV (single dose vial) medications used.  Description of the Procedure: Protocol guidelines were followed. The patient was placed in position over the fluoroscopy table. The target area was identified and the  area prepped in the usual manner. Skin desensitized using vapocoolant spray. Skin & deeper tissues infiltrated with local anesthetic. Appropriate amount of time allowed to pass for local anesthetics to take effect. The procedure needle was introduced through the skin, ipsilateral to the reported pain, and advanced to the target area. Employing the "Medial Branch Technique", the needles were advanced to the angle made by the superior and medial portion of the transverse process, and the lateral  and inferior portion of the superior articulating process of the targeted vertebral bodies. This area is known as "Burton's Eye" or the "Eye of the [place] Dog". A procedure needle was introduced through the skin, and this time advanced to the angle made by the superior and medial border of the sacral ala, and the lateral border of the S1 vertebral body. This last needle was later repositioned at the superior and lateral border of the posterior S1 foramen. Negative aspiration confirmed. Solution injected in intermittent fashion, asking for systemic symptoms every 0.5cc of injectate. The needles were then removed and the area cleansed, making sure to leave some of the prepping solution back to take advantage of its long term bactericidal properties. Start Time: 1106 hrs. Materials:  Needle(s) Type: Regular needle Gauge: 22G Length: 3.5-in Medication(s): We administered lactated ringers, midazolam, fentaNYL, triamcinolone acetonide, lidocaine (PF), triamcinolone acetonide, ropivacaine (PF) 2 mg/mL (0.2%), methylPREDNISolone acetate, lidocaine (PF), ropivacaine (PF) 2 mg/mL (0.2%), and ropivacaine (PF) 2 mg/mL (0.2%). Please see chart orders for dosing details.  Description of Procedure # 2 Process:   Position: Prone Target Area: For upper sacroiliac joint block(s), the target is the superior and posterior margin of the sacroiliac joint. Approach: Ipsilateral approach. Area Prepped: Entire Posterior Lumbosacral  Region Prepping solution: ChloraPrep (2% chlorhexidine gluconate and 70% isopropyl alcohol) Safety Precautions: Aspiration looking for blood return was conducted prior to all injections. At no point did we inject any substances, as a needle was being advanced. No attempts were made at seeking any paresthesias. Safe injection practices and needle disposal techniques used. Medications properly checked for expiration dates. SDV (single dose vial) medications used. Description of the Procedure: Protocol guidelines were followed. The patient was placed in position over the fluoroscopy table. The target area was identified and the area prepped in the usual manner. Skin desensitized using vapocoolant spray. Skin & deeper tissues infiltrated with local anesthetic. Appropriate amount of time allowed to pass for local anesthetics to take effect. The procedure needle was advanced under fluoroscopic guidance into the sacroiliac joint until a firm endpoint was obtained. Proper needle placement secured. Negative aspiration confirmed. Solution injected in intermittent fashion, asking for systemic symptoms every 0.5cc of injectate. The needles were then removed and the area cleansed, making sure to leave some of the prepping solution back to take advantage of its long term bactericidal properties. Vitals:   09/06/16 1123 09/06/16 1133 09/06/16 1143 09/06/16 1153  BP: 95/60 97/64 101/64 103/67  Pulse:      Resp: 19 (!) 24 20 20   Temp:  97.7 F (36.5 C)  98 F (36.7 C)  SpO2: 96% 97% 96% 96%  Weight:      Height:        End Time:   hrs. Materials:  Needle(s) Type: Regular needle Gauge: 22G Length: 3.5-in Medication(s): We administered lactated ringers, midazolam, fentaNYL, triamcinolone acetonide, lidocaine (PF), triamcinolone acetonide, ropivacaine (PF) 2 mg/mL (0.2%), methylPREDNISolone acetate, lidocaine (PF), ropivacaine (PF) 2 mg/mL (0.2%), and ropivacaine (PF) 2 mg/mL (0.2%). Please see chart orders for  dosing details.  Imaging Guidance (Spinal):  Type of Imaging Technique: Fluoroscopy Guidance (Spinal) Indication(s): Assistance in needle guidance and placement for procedures requiring needle placement in or near specific anatomical locations not easily accessible without such assistance. Exposure Time: Please see nurses notes. Contrast: None used. Fluoroscopic Guidance: I was personally present during the use of fluoroscopy. "Tunnel Vision Technique" used to obtain the best possible view of the target area. Parallax error corrected before commencing the  procedure. "Direction-depth-direction" technique used to introduce the needle under continuous pulsed fluoroscopy. Once target was reached, antero-posterior, oblique, and lateral fluoroscopic projection used confirm needle placement in all planes. Images permanently stored in EMR. Interpretation: No contrast injected. I personally interpreted the imaging intraoperatively. Adequate needle placement confirmed in multiple planes. Permanent images saved into the patient's record.  Antibiotic Prophylaxis:  Indication(s): None identified Antibiotic given: None  Post-operative Assessment:  EBL: None Complications: No immediate post-treatment complications observed by team, or reported by patient. Note: The patient tolerated the entire procedure well. A repeat set of vitals were taken after the procedure and the patient was kept under observation following institutional policy, for this type of procedure. Post-procedural neurological assessment was performed, showing return to baseline, prior to discharge. The patient was provided with post-procedure discharge instructions, including a section on how to identify potential problems. Should any problems arise concerning this procedure, the patient was given instructions to immediately contact us, at any time, without hesitation. In any case, we plan to contact the patient by telephone for a follow-up status  report regarding this interventional procedure. Comments:  No additional relevant information.  Plan of Care  Disposition: Discharge home  Discharge Date & Time: 09/06/2016; 1205 hrs.  Physician-requested Follow-up:  No Follow-up on file.  Future Appointments Date Time Provider [place]  09/07/2016 10:30 AM [facility]  09/08/2016 10:45 AM Hollice Espy, MD [facility] None  09/09/2016 9:30 AM [facility] None  10/05/2016 2:30 PM [name], MD [facility] None  10/21/2016 10:30 AM [facility] None  10/21/2016 10:45 AM Sindy Guadeloupe, MD [facility] None  10/22/2016 10:00 AM Flora Lipps, MD [facility] None  11/01/2016 11:00 AM [facility]  11/02/2016 2:00 PM Sindy Guadeloupe, MD [facility] None  11/18/2016 9:15 AM Vevelyn Francois, NP [facility] None   Medications ordered for procedure: Meds ordered this encounter  Medications  . lactated ringers infusion 1,000 mL  . midazolam (VERSED) 5 MG/5ML injection 1-2 mg    Make sure Flumazenil is available in the pyxis when using this medication. If oversedation occurs, administer 0.2 mg IV over 15 sec. If after 45 sec no response, administer 0.2 mg again over 1 min; may repeat at 1 min intervals; not to exceed 4 doses (1 mg)  . fentaNYL (SUBLIMAZE) injection 25-50 mcg    Make sure Narcan is available in the pyxis when using this medication. In the event of respiratory depression (RR< 8/min): Titrate NARCAN (naloxone) in increments of 0.1 to 0.2 mg IV at 2-3 minute intervals, until desired degree of reversal.  . lidocaine (PF) (XYLOCAINE) 1 % injection 10 mL  . triamcinolone acetonide (KENALOG-40) injection 40 mg  . DISCONTD: ropivacaine (PF) 2 mg/mL (0.2%) (NAROPIN) injection 25 mg    Preservative-free (MPF), single use vial.  . lidocaine (PF) (XYLOCAINE) 1 % injection 10 mL  . triamcinolone acetonide (KENALOG-40) injection 40 mg  . ropivacaine (PF) 2 mg/mL (0.2%) (NAROPIN) injection 9  mL  . methylPREDNISolone acetate (DEPO-MEDROL) injection 80 mg  . lidocaine (PF) (XYLOCAINE) 1 % injection 10 mL  . ropivacaine (PF) 2 mg/mL (0.2%) (NAROPIN) injection 9 mL  . ropivacaine (PF) 2 mg/mL (0.2%) (NAROPIN) injection 9 mL   Medications administered: We administered lactated ringers, midazolam, fentaNYL, triamcinolone acetonide, lidocaine (PF), triamcinolone acetonide, ropivacaine (PF) 2 mg/mL (0.2%), methylPREDNISolone acetate, lidocaine (PF), ropivacaine (PF) 2 mg/mL (0.2%), and ropivacaine (PF) 2 mg/mL (0.2%).  See the medical record for exact dosing, route, and  time of administration.  Lab-work, Procedure(s), & Referral(s) Ordered: Orders Placed This Encounter  Procedures  . LUMBAR FACET(MEDIAL BRANCH NERVE BLOCK) MBNB  . SACROILIAC JOINT INJECTINS  . DG C-Arm 1-60 Min-No Report   Imaging Ordered: Results for orders placed in visit on 07/12/16  DG C-Arm 1-60 Min-No Report   Narrative Fluoroscopy was utilized by the requesting physician.  No radiographic  interpretation.    New Prescriptions   No medications on file   Primary Care Physician: Jodi Marble, MD Location: [facility] Note by: Kathlen Brunswick. Dossie Arbour, M.D, DABA, DABAPM, DABPM, DABIPP, FIPP Date: 09/06/2016; Time: 2:26 PM  Disclaimer:  Medicine is not an Chief Strategy Officer. The only guarantee in medicine is that nothing is guaranteed. It is important to note that the decision to proceed with this intervention was based on the information collected from the patient. The Data and conclusions were drawn from the patient's questionnaire, the interview, and the physical examination. Because the information was provided in large part by the patient, it cannot be guaranteed that it has not been purposely or unconsciously manipulated. Every effort has been made to obtain as much relevant data as possible for this evaluation. It is important to note that the conclusions that lead to this  procedure are derived in large part from the available data. Always take into account that the treatment will also be dependent on availability of resources and existing treatment guidelines, considered by other Pain Management Practitioners as being common knowledge and practice, at the time of the intervention. For Medico-Legal purposes, it is also important to point out that variation in procedural techniques and pharmacological choices are the acceptable norm. The indications, contraindications, technique, and results of the above procedure should only be interpreted and judged by a Board-Certified Interventional Pain Specialist with extensive familiarity and expertise in the same exact procedure and technique.  Instructions provided at this appointment: Patient Instructions   Pain Management Discharge Instructions  General Discharge Instructions :  If you need to reach your doctor call: Monday-Friday 8:00 am - 4:00 pm at [phone] or toll free [phone].  After clinic hours [phone] to have operator reach doctor.  Bring all of your medication bottles to all your appointments in the pain clinic.  To cancel or reschedule your appointment with Pain Management please remember to call 24 hours in advance to avoid a fee.  Refer to the educational materials which you have been given on: General Risks, I had my Procedure. Discharge Instructions, Post Sedation.  Post Procedure Instructions:  The drugs you were given will stay in your system until tomorrow, so for the next 24 hours you should not drive, make any legal decisions or drink any alcoholic beverages.  You may eat anything you prefer, but it is better to start with liquids then soups and crackers, and gradually work up to solid foods.  Please notify your doctor immediately if you have any unusual bleeding, trouble breathing or pain that is not related to your normal pain.  Depending on the type of procedure that was done,  some parts of your body may feel week and/or numb.  This usually clears up by tonight or the next day.  Walk with the use of an assistive device or accompanied by an adult for the 24 hours.  You may use ice on the affected area for the first 24 hours.  Put ice in a Ziploc bag and cover with a towel and place against area 15 minutes  on 15 minutes off.  You may switch to heat after 24 hours.GENERAL RISKS AND COMPLICATIONS  What are the risk, side effects and possible complications? Generally speaking, most procedures are safe.  However, with any procedure there are risks, side effects, and the possibility of complications.  The risks and complications are dependent upon the sites that are lesioned, or the type of nerve block to be performed.  The closer the procedure is to the spine, the more serious the risks are.  Great care is taken when placing the radio frequency needles, block needles or lesioning probes, but sometimes complications can occur. 1. Infection: Any time there is an injection through the skin, there is a risk of infection.  This is why sterile conditions are used for these blocks.  There are four possible types of infection. 1. Localized skin infection. 2. Central Nervous System Infection-This can be in the form of Meningitis, which can be deadly. 3. Epidural Infections-This can be in the form of an epidural abscess, which can cause pressure inside of the spine, causing compression of the spinal cord with subsequent paralysis. This would require an emergency surgery to decompress, and there are no guarantees that the patient would recover from the paralysis. 4. Discitis-This is an infection of the intervertebral discs.  It occurs in about 1% of discography procedures.  It is difficult to treat and it may lead to surgery.        2. Pain: the needles have to go through skin and soft tissues, will cause soreness.       3. Damage to internal structures:  The nerves to be lesioned may be  near blood vessels or    other nerves which can be potentially damaged.       4. Bleeding: Bleeding is more common if the patient is taking blood thinners such as  aspirin, Coumadin, Ticiid, Plavix, etc., or if he/she have some genetic predisposition  such as hemophilia. Bleeding into the spinal canal can cause compression of the spinal  cord with subsequent paralysis.  This would require an emergency surgery to  decompress and there are no guarantees that the patient would recover from the  paralysis.       5. Pneumothorax:  Puncturing of a lung is a possibility, every time a needle is introduced in  the area of the chest or upper back.  Pneumothorax refers to free air around the  collapsed lung(s), inside of the thoracic cavity (chest cavity).  Another two possible  complications related to a similar event would include: Hemothorax and Chylothorax.   These are variations of the Pneumothorax, where instead of air around the collapsed  lung(s), you may have blood or chyle, respectively.       6. Spinal headaches: They may occur with any procedures in the area of the spine.       7. Persistent CSF (Cerebro-Spinal Fluid) leakage: This is a rare problem, but may occur  with prolonged intrathecal or epidural catheters either due to the formation of a fistulous  track or a dural tear.       8. Nerve damage: By working so close to the spinal cord, there is always a possibility of  nerve damage, which could be as serious as a permanent spinal cord injury with  paralysis.       9. Death:  Although rare, severe deadly allergic reactions known as "Anaphylactic  reaction" can occur to any of the medications used.  10. Worsening of the symptoms:  We can always make thing worse.  What are the chances of something like this happening? Chances of any of this occuring are extremely low.  By statistics, you have more of a chance of getting killed in a motor vehicle accident: while driving to the hospital than any of  the above occurring .  Nevertheless, you should be aware that they are possibilities.  In general, it is similar to taking a shower.  Everybody knows that you can slip, hit your head and get killed.  Does that mean that you should not shower again?  Nevertheless always keep in mind that statistics do not mean anything if you happen to be on the wrong side of them.  Even if a procedure has a 1 (one) in a 1,000,000 (million) chance of going wrong, it you happen to be that one..Also, keep in mind that by statistics, you have more of a chance of having something go wrong when taking medications.  Who should not have this procedure? If you are on a blood thinning medication (e.g. Coumadin, Plavix, see list of "Blood Thinners"), or if you have an active infection going on, you should not have the procedure.  If you are taking any blood thinners, please inform your physician.  How should I prepare for this procedure?  Do not eat or drink anything at least six hours prior to the procedure.  Bring a driver with you .  It cannot be a taxi.  Come accompanied by an adult that can drive you back, and that is strong enough to help you if your legs get weak or numb from the local anesthetic.  Take all of your medicines the morning of the procedure with just enough water to swallow them.  If you have diabetes, make sure that you are scheduled to have your procedure done first thing in the morning, whenever possible.  If you have diabetes, take only half of your insulin dose and notify our nurse that you have done so as soon as you arrive at the clinic.  If you are diabetic, but only take blood sugar pills (oral hypoglycemic), then do not take them on the morning of your procedure.  You may take them after you have had the procedure.  Do not take aspirin or any aspirin-containing medications, at least eleven (11) days prior to the procedure.  They may prolong bleeding.  Wear loose fitting clothing that may be  easy to take off and that you would not mind if it got stained with Betadine or blood.  Do not wear any jewelry or perfume  Remove any nail coloring.  It will interfere with some of our monitoring equipment.  NOTE: Remember that this is not meant to be interpreted as a complete list of all possible complications.  Unforeseen problems may occur.  BLOOD THINNERS The following drugs contain aspirin or other products, which can cause increased bleeding during surgery and should not be taken for 2 weeks prior to and 1 week after surgery.  If you should need take something for relief of minor pain, you may take acetaminophen which is found in Tylenol,m Datril, Anacin-3 and Panadol. It is not blood thinner. The products listed below are.  Do not take any of the products listed below in addition to any listed on your instruction sheet.  A.P.C or A.P.C with Codeine Codeine Phosphate Capsules #3 Ibuprofen Ridaura  ABC compound Congesprin Imuran rimadil  Advil Cope Indocin Robaxisal  Alka-Seltzer Effervescent Pain Reliever and Antacid Coricidin or Coricidin-D  Indomethacin Rufen  Alka-Seltzer plus Cold Medicine Cosprin Ketoprofen S-A-C Tablets  Anacin Analgesic Tablets or Capsules Coumadin Korlgesic Salflex  Anacin Extra Strength Analgesic tablets or capsules CP-2 Tablets Lanoril Salicylate  Anaprox Cuprimine Capsules Levenox Salocol  Anexsia-D Dalteparin Magan Salsalate  Anodynos Darvon compound Magnesium Salicylate Sine-off  Ansaid Dasin Capsules Magsal Sodium Salicylate  Anturane Depen Capsules Marnal Soma  APF Arthritis pain formula Dewitt's Pills Measurin Stanback  Argesic Dia-Gesic Meclofenamic Sulfinpyrazone  Arthritis Bayer Timed Release Aspirin Diclofenac Meclomen Sulindac  Arthritis pain formula Anacin Dicumarol Medipren Supac  Analgesic (Safety coated) Arthralgen Diffunasal Mefanamic Suprofen  Arthritis Strength Bufferin Dihydrocodeine Mepro Compound Suprol  Arthropan liquid  Dopirydamole Methcarbomol with Aspirin Synalgos  ASA tablets/Enseals Disalcid Micrainin Tagament  Ascriptin Doan's Midol Talwin  Ascriptin A/D Dolene Mobidin Tanderil  Ascriptin Extra Strength Dolobid Moblgesic Ticlid  Ascriptin with Codeine Doloprin or Doloprin with Codeine Momentum Tolectin  Asperbuf Duoprin Mono-gesic Trendar  Aspergum Duradyne Motrin or Motrin IB Triminicin  Aspirin plain, buffered or enteric coated Durasal Myochrisine Trigesic  Aspirin Suppositories Easprin Nalfon Trillsate  Aspirin with Codeine Ecotrin Regular or Extra Strength Naprosyn Uracel  Atromid-S Efficin Naproxen Ursinus  Auranofin Capsules Elmiron Neocylate Vanquish  Axotal Emagrin Norgesic Verin  Azathioprine Empirin or Empirin with Codeine Normiflo Vitamin E  Azolid Emprazil Nuprin Voltaren  Bayer Aspirin plain, buffered or children's or timed BC Tablets or powders Encaprin Orgaran Warfarin Sodium  Buff-a-Comp Enoxaparin Orudis Zorpin  Buff-a-Comp with Codeine Equegesic Os-Cal-Gesic   Buffaprin Excedrin plain, buffered or Extra Strength Oxalid   Bufferin Arthritis Strength Feldene Oxphenbutazone   Bufferin plain or Extra Strength Feldene Capsules Oxycodone with Aspirin   Bufferin with Codeine Fenoprofen Fenoprofen Pabalate or Pabalate-SF   Buffets II Flogesic Panagesic   Buffinol plain or Extra Strength Florinal or Florinal with Codeine Panwarfarin   Buf-Tabs Flurbiprofen Penicillamine   Butalbital Compound Four-way cold tablets Penicillin   Butazolidin Fragmin Pepto-Bismol   Carbenicillin Geminisyn Percodan   Carna Arthritis Reliever Geopen Persantine   Carprofen Gold's salt Persistin   Chloramphenicol Goody's Phenylbutazone   Chloromycetin Haltrain Piroxlcam   Clmetidine heparin Plaquenil   Cllnoril Hyco-pap Ponstel   Clofibrate Hydroxy chloroquine Propoxyphen         Before stopping any of these medications, be sure to consult the physician who ordered them.  Some, such as Coumadin (Warfarin)  are ordered to prevent or treat serious conditions such as "deep thrombosis", "pumonary embolisms", and other heart problems.  The amount of time that you may need off of the medication may also vary with the medication and the reason for which you were taking it.  If you are taking any of these medications, please make sure you notify your pain physician before you undergo any procedures.         Facet Blocks Patient Information  Description: The facets are joints in the spine between the vertebrae.  Like any joints in the body, facets can become irritated and painful.  Arthritis can also effect the facets.  By injecting steroids and local anesthetic in and around these joints, we can temporarily block the nerve supply to them.  Steroids act directly on irritated nerves and tissues to reduce selling and inflammation which often leads to decreased pain.  Facet blocks may be done anywhere along the spine from the neck to the low back depending upon the location of your pain.   After numbing the skin with local anesthetic (like  Novocaine), a small needle is passed onto the facet joints under x-ray guidance.  You may experience a sensation of pressure while this is being done.  The entire block usually lasts about 15-25 minutes.   Conditions which may be treated by facet blocks:   Low back/buttock pain  Neck/shoulder pain  Certain types of headaches  Preparation for the injection:  1. Do not eat any solid food or dairy products within 8 hours of your appointment. 2. You may drink clear liquid up to 3 hours before appointment.  Clear liquids include water, black coffee, juice or soda.  No milk or cream please. 3. You may take your regular medication, including pain medications, with a sip of water before your appointment.  Diabetics should hold regular insulin (if taken separately) and take 1/2 normal NPH dose the morning of the procedure.  Carry some sugar containing items with you to your  appointment. 4. A driver must accompany you and be prepared to drive you home after your procedure. 5. Bring all your current medications with you. 6. An IV may be inserted and sedation may be given at the discretion of the physician. 7. A blood pressure cuff, EKG and other monitors will often be applied during the procedure.  Some patients may need to have extra oxygen administered for a short period. 8. You will be asked to provide medical information, including your allergies and medications, prior to the procedure.  We must know immediately if you are taking blood thinners (like Coumadin/Warfarin) or if you are allergic to IV iodine contrast (dye).  We must know if you could possible be pregnant.  Possible side-effects:   Bleeding from needle site  Infection (rare, may require surgery)  Nerve injury (rare)  Numbness & tingling (temporary)  Difficulty urinating (rare, temporary)  Spinal headache (a headache worse with upright posture)  Light-headedness (temporary)  Pain at injection site (serveral days)  Decreased blood pressure (rare, temporary)  Weakness in arm/leg (temporary)  Pressure sensation in back/neck (temporary)   Call if you experience:   Fever/chills associated with headache or increased back/neck pain  Headache worsened by an upright position  New onset, weakness or numbness of an extremity below the injection site  Hives or difficulty breathing (go to the emergency room)  Inflammation or drainage at the injection site(s)  Severe back/neck pain greater than usual  New symptoms which are concerning to you  Please note:  Although the local anesthetic injected can often make your back or neck feel good for several hours after the injection, the pain will likely return. It takes 3-7 days for steroids to work.  You may not notice any pain relief for at least one week.  If effective, we will often do a series of 2-3 injections spaced 3-6 weeks apart to  maximally decrease your pain.  After the initial series, you may be a candidate for a more permanent nerve block of the facets.  If you have any questions, please call #[phone]) [facility]

## 2016-09-06 NOTE — Progress Notes (Addendum)
Safety precautions to be maintained throughout the outpatient stay will include: orient to surroundings, keep bed in low position, maintain call bell within reach at all times, provide assistance with transfer out of bed and ambulation. Patient states she is having a paracentesis tomorrow and is on antibiotic.  Informed patient of side effects of steroids.  Patient wishes to proceed with injection.  Dr Dossie Arbour notified.

## 2016-09-07 ENCOUNTER — Ambulatory Visit
Admission: RE | Admit: 2016-09-07 | Discharge: 2016-09-07 | Disposition: A | Discharge status: Home / Self Care | Payer: Medicare Other | Source: Ambulatory Visit | Attending: Gastroenterology | Admitting: Gastroenterology

## 2016-09-07 ENCOUNTER — Telehealth: Payer: Self-pay | Admitting: *Deleted

## 2016-09-07 DIAGNOSIS — K7031 Alcoholic cirrhosis of liver with ascites: Secondary | ICD-10-CM | POA: Diagnosis present

## 2016-09-07 DIAGNOSIS — K659 Peritonitis, unspecified: Secondary | ICD-10-CM | POA: Insufficient documentation

## 2016-09-07 LAB — PROTEIN, PLEURAL OR PERITONEAL FLUID: Total protein, fluid: 3.6 g/dL

## 2016-09-07 LAB — BODY FLUID CELL COUNT WITH DIFFERENTIAL
Eos, Fluid: 0 %
Lymphs, Fluid: 27 %
Monocyte-Macrophage-Serous Fluid: 43 %
Neutrophil Count, Fluid: 30 %
Total Nucleated Cell Count, Fluid: 825 cu mm

## 2016-09-07 LAB — ALBUMIN, PLEURAL OR PERITONEAL FLUID: Albumin, Fluid: 2.2 g/dL

## 2016-09-07 LAB — LACTATE DEHYDROGENASE, PLEURAL OR PERITONEAL FLUID: LD, Fluid: 55 U/L — ABNORMAL HIGH (ref 3–23)

## 2016-09-07 LAB — GLUCOSE, PLEURAL OR PERITONEAL FLUID: Glucose, Fluid: 206 mg/dL

## 2016-09-07 NOTE — Telephone Encounter (Signed)
Left voicemail with patient to call our office if there are questions or concerns re; procedure on yesterday. 

## 2016-09-07 NOTE — Procedures (Signed)
US paracentesis without difficulty  Complications:  None  Blood Loss: none  See dictation in canopy pacs  

## 2016-09-08 ENCOUNTER — Ambulatory Visit (INDEPENDENT_AMBULATORY_CARE_PROVIDER_SITE_OTHER): Payer: Medicare Other | Admitting: Urology

## 2016-09-08 ENCOUNTER — Encounter: Payer: Self-pay | Admitting: Urology

## 2016-09-08 VITALS — BP 103/62 | HR 73 | Ht 69.0 in | Wt 170.0 lb

## 2016-09-08 DIAGNOSIS — N2889 Other specified disorders of kidney and ureter: Secondary | ICD-10-CM

## 2016-09-08 LAB — CYTOLOGY - NON PAP

## 2016-09-08 NOTE — Progress Notes (Signed)
09/08/2016 4:36 PM   [name] 1960/05/19 [id number]  Referring provider: Jodi Marble, MD [place], [place] [address]  Chief Complaint  Patient presents with  . Results    10month    HPI: 56 yo F with an enhancing 1.7 cm right upper pole renal mass which was incidentally discovered on CT abdomen and pelvis with contrast on 01/2016.  She returns today for routine surveillance.  In retrospect, on imaging back in 2012, the lesion was quite subtle but present measuring approximately 1.0 cm at that time as well.  Since her last visit, she is being followed for a small pulmonary nodule by Dr. Grayland Ormond. She underwent PET/CT on 08/11/2016 which shows essentially stable renal mass. She's not had any dedicated renal imaging and is concerned about cost and radiation exposure which is reasonable.  She denies any flank pain although she has chronic spine issues and at large amount of spinal hardware from scoliosis.  She denies any flank pain, gross hematuria, or any other urinary symptoms.    Her past medical history is significant for multiple medical problems including a history of alcoholic cirrhosis, stage IV, with significant ascites, hep C s/p Harvani, DM II and recent small bowel incarceration. She has been evaluated for liver transplant and is on the list. She is no longer drinking.  She underwent paracentesis just yesterday for ascites.   PMH: Past Medical History:  Diagnosis Date  . Alcohol abuse   . Alcoholic cirrhosis of liver without ascites ([place])   . Anemia   . Anxiety   . Arthropathy   . Back pain   . Bronchitis   . BRONCHITIS, ACUTE 11/01/2009   Qualifier: Diagnosis of  By: Alveta Heimlich MD, Cornelia Copa    . Chronic hepatitis C ([place])   . Chronic LBP 12/25/2014   Overview:  Post extensive surgery   . Cirrhosis ([place])   . DDD (degenerative disc disease), lumbar 01/01/2015  . Depression   . Diabetes mellitus without complication ([place])   . Diverticulosis   . Edema    . Fatigue   . Fibromyalgia   . GERD (gastroesophageal reflux disease)    gastroparesis  . High risk medications (not anticoagulants) long-term use   . Hyperglycemia   . Hypertension   . Kidney mass    11/17 small mass-  . Left leg pain 01/01/2015  . Lumbago   . Lumbar radicular pain 01/01/2015  . Muscle spasm   . Neck pain 01/01/2015  . Polyarthritis   . Reflux   . Restless leg syndrome    01/2016  . RLS (restless legs syndrome)   . Sacroiliac pain 01/01/2015  . Shoulder pain   . Sinusitis   . SOB (shortness of breath)   . Spine disorder   . Stress headaches   . Upper back pain 01/01/2015  . Urinary frequency   . Vitamin D deficiency disease     Surgical History: Past Surgical History:  Procedure Laterality Date  . BACK SURGERY  12/19/2011  . BACK SURGERY    . COLONOSCOPY WITH PROPOFOL N/A 09/25/2015   Procedure: COLONOSCOPY WITH PROPOFOL;  Surgeon: Lollie Sails, MD;  Location: [facility] ENDOSCOPY;  Service: Endoscopy;  Laterality: N/A;  . COLONOSCOPY WITH PROPOFOL N/A 04/01/2016   Procedure: COLONOSCOPY WITH PROPOFOL;  Surgeon: Lollie Sails, MD;  Location[facility];  Service: Endoscopy;  Laterality: N/A;  . ESOPHAGOGASTRODUODENOSCOPY N/A 04/14/2015   Procedure: ESOPHAGOGASTRODUODENOSCOPY (EGD);  Surgeon: Lollie Sails, MD;  Location:  [facility] ENDOSCOPY;  Service: Endoscopy;  Laterality: N/A;  . ESOPHAGOGASTRODUODENOSCOPY (EGD) WITH PROPOFOL N/A 09/16/2015   Procedure: ESOPHAGOGASTRODUODENOSCOPY (EGD) WITH PROPOFOL;  Surgeon: Lollie Sails, MD;  Location: [facility] ENDOSCOPY;  Service: Endoscopy;  Laterality: N/A;  . ESOPHAGOGASTRODUODENOSCOPY (EGD) WITH PROPOFOL N/A 01/22/2016   Procedure: ESOPHAGOGASTRODUODENOSCOPY (EGD) WITH PROPOFOL;  Surgeon: [name], MD;  Location: [facility];  Service: Endoscopy;  Laterality: N/A;  . ESOPHAGOGASTRODUODENOSCOPY (EGD) WITH PROPOFOL N/A 04/01/2016   Procedure: ESOPHAGOGASTRODUODENOSCOPY (EGD) WITH PROPOFOL;  Surgeon:  Lollie Sails, MD;  Location: [facility] ENDOSCOPY;  Service: Endoscopy;  Laterality: N/A;  . HERNIA REPAIR N/A 10/2015   abdominal   . TONSILLECTOMY    . TUBAL LIGATION    . UMBILICAL HERNIA REPAIR N/A 11/06/2015   Procedure: HERNIA REPAIR UMBILICAL ADULT;  Surgeon: [name], MD;  Location: [facility] ORS;  Service: General;  Laterality: N/A;    Home Medications:  Allergies as of 09/08/2016   No Known Allergies     Medication List       Accurate as of 09/08/16  4:36 PM. Always use your most recent med list.          busPIRone 7.5 MG tablet Commonly known as:  BUSPAR Take 7.5 mg by mouth 2 (two) times daily.   calcium carbonate 500 MG chewable tablet Commonly known as:  TUMS - dosed in mg elemental calcium Chew 2 tablets by mouth 2 (two) times daily.   ciprofloxacin 250 MG tablet Commonly known as:  CIPRO TAKE 1 TABLET (250 MG TOTAL) BY MOUTH ONCE DAILY.   furosemide 40 MG tablet Commonly known as:  LASIX Take by mouth 2 (two) times daily.   lactulose 10 GM/15ML solution Commonly known as:  CHRONULAC Take 10 g by mouth 3 (three) times daily.   Magnesium Oxide 250 MG Tabs Take 1 tablet by mouth daily.   multivitamin with minerals Tabs tablet Take 1 tablet by mouth daily.   Oxycodone HCl 20 MG Tabs Take 1 tablet (20 mg total) by mouth every 6 (six) hours.   Oxycodone HCl 20 MG Tabs Take 1 tablet (20 mg total) by mouth every 6 (six) hours as needed. Start taking on:  09/24/2016   Oxycodone HCl 20 MG Tabs Take 1 tablet (20 mg total) by mouth every 6 (six) hours as needed. Start taking on:  10/24/2016   pantoprazole 40 MG tablet Commonly known as:  PROTONIX TAKE 1 TABLET (40 MG TOTAL) BY MOUTH 2 (TWO) TIMES DAILY.   PARoxetine 40 MG tablet Commonly known as:  PAXIL Take 40 mg by mouth every morning.   pregabalin 75 MG capsule Commonly known as:  LYRICA Take 75 mg by mouth 2 (two) times daily.   spironolactone 50 MG tablet Commonly known as:   ALDACTONE Take 100 mg by mouth daily.   sucralfate 1 GM/10ML suspension Commonly known as:  CARAFATE Take 1 g by mouth 3 (three) times daily as needed.   thiamine 100 MG tablet Commonly known as:  VITAMIN B-1 Take 1 tablet (100 mg total) by mouth daily.   traZODone 50 MG tablet Commonly known as:  DESYREL Take 50 mg by mouth at bedtime.       Allergies: No Known Allergies  Family History: Family History  Problem Relation Age of Onset  . Stroke Mother   . Heart disease Mother   . [name] Father   . Breast cancer Sister 29  . Kidney disease Neg Hx   . Bladder Cancer  Neg Hx   . Kidney cancer Neg Hx   . Prostate cancer Neg Hx     Social History:  reports that she has been smoking Cigarettes.  She has a 84.00 pack-year smoking history. She has never used smokeless tobacco. She reports that she does not drink alcohol or use drugs.  ROS: UROLOGY Frequent Urination?: No Hard to postpone urination?: No Burning/pain with urination?: No Get up at night to urinate?: No Leakage of urine?: No Urine stream starts and stops?: No Trouble starting stream?: No Do you have to strain to urinate?: No Blood in urine?: No Urinary tract infection?: No Sexually transmitted disease?: No Injury to kidneys or bladder?: No Painful intercourse?: No Weak stream?: No Currently pregnant?: No Vaginal bleeding?: No Last menstrual period?: n  Gastrointestinal Nausea?: No Vomiting?: No Indigestion/heartburn?: No Diarrhea?: No Constipation?: No  Constitutional Fever: No Night sweats?: No Weight loss?: No Fatigue?: Yes  Skin Skin rash/lesions?: No Itching?: No  Eyes Blurred vision?: No Double vision?: No  Ears/Nose/Throat Sore throat?: No Sinus problems?: No  Hematologic/Lymphatic Swollen glands?: No Easy bruising?: Yes  Cardiovascular Leg swelling?: Yes Chest pain?: No  Respiratory Cough?: No Shortness of breath?: No  Endocrine Excessive thirst?:  No  Musculoskeletal Back pain?: Yes Joint pain?: Yes  Neurological Headaches?: No Dizziness?: No  Psychologic Depression?: Yes Anxiety?: Yes  Physical Exam: BP 103/62   Pulse 73   Ht 5\' 9"  (1.753 m)   Wt 170 lb (77.1 kg)   BMI 25.10 kg/m   Constitutional:  Alert and oriented, No acute distress. Ambulating with walker today. HEENT: [place] AT, moist mucus membranes.  Trachea midline, no masses. Cardiovascular: Bilateral lower extremity edema appreciated today. Respiratory: Normal respiratory effort, no increased work of breathing. GI: Abdomen is soft, nontender, no abdominal masses, mild distention from ascitic fluid, umbilical incision appreciated. GU: No CVA tenderness.  Skin: No rashes, bruises or suspicious lesions. Neurologic: Grossly intact, no focal deficits, moving all 4 extremities. Psychiatric: Normal mood and affect.  Laboratory Data: Lab Results  Component Value Date   WBC 7.6 07/29/2016   HGB 12.6 07/29/2016   HCT 36.7 07/29/2016   MCV 90.3 07/29/2016   PLT 147 (L) 07/29/2016    Lab Results  Component Value Date   CREATININE 0.69 04/12/2016    Lab Results  Component Value Date   HGBA1C 5.3 05/22/2015    Pertinent Imaging: CLINICAL DATA:  Initial treatment strategy for pulmonary nodules.  EXAM: NUCLEAR MEDICINE PET SKULL BASE TO THIGH  TECHNIQUE: 12.4 mCi F-18 FDG was injected intravenously. Full-ring PET imaging was performed from the skull base to thigh after the radiotracer. CT data was obtained and used for attenuation correction and anatomic localization.  FASTING BLOOD GLUCOSE:  Value: 138 mg/dl  COMPARISON:  Chest CT from 08/03/2016  FINDINGS: NECK  No hypermetabolic lymph nodes in the neck.  CHEST  1.3 by 1.0 cm right upper lobe nodule on image 104/3 has a maximum SUV of 1.8.  The ground-glass density nodules observed in both upper lobes and the 0.7 by 0.6 cm solid nodule in the left upper lobe on image 100/3 are  not associated with appreciable metabolic activity.  Aortic and branch vessel atherosclerotic vascular disease.  ABDOMEN/PELVIS  The right kidney upper pole renal mass seen on prior CT abdomen from 02/17/2016 has similar density and similar metabolic activity to the surrounding renal parenchymal tissue. The appearance on prior CT of 02/17/2016 was very suspicious for renal cell carcinoma.  Cirrhosis and chronic ascites  noted along with chronic low-level omental and mesenteric edema. Gallstones are present in the gallbladder.  No abnormal hepatic, splenic, or pancreatic activity. No pathologic or hypermetabolic retroperitoneal adenopathy.  Physiologic activity in bowel.  Fluid density 7.6 by 5.5 cm structure along the right posterolateral margin of the uterus, possibly loculated ascites or an ovarian lesion, no associated hypermetabolic activity. Adjacent pelvic ascites noted.  SKELETON  Posterolateral rod and pedicle screw fixation of the entire lumbar spine and much of the thoracic spine. No significant abnormal hypermetabolic bony activity.  IMPRESSION: 1. The right upper lobe pulmonary nodule is only mildly metabolic, with an SUV of 1.8. Morphologically this lesion has a concerning appearance and I am still suspicious for the possibility of low-grade adenocarcinoma. 2. Sub solid nodules in both upper lobes and the smaller 7 by 6 mm solid nodule in the left upper lobe are not metabolic on today's exam but require surveillance for low-grade malignancy. 3. The patient has a known 1.7 cm enhancing mass of the right kidney upper pole. On today's PET-CT this seems to have a similar metabolic activity to the rest of the kidney. On balance the appearance is highly suspicious for renal cell carcinoma. No current findings of hypermetabolic metastatic adenopathy. 4. Cirrhosis and ascites. 5. A 7 by 6 cm loculated fluid collection along the right posterolateral margin  of the uterus, could represent loculated ascites or a cystic ovarian lesion. No hypermetabolic activity associated with this lesion. 6. Cholelithiasis.  PET scan was reviewed today. This is compared to her previous CT scan on 01/2016.  Assessment & Plan:  56 year old female with multiple medical problems with a 17 mm enhancing right upper pole renal mass, increased from 1 cm in 2012.  1. Right renal mass Right upper pole possibly enhancing renal mass, very slow interval growth since 2012. Patient continues to be fairly stable in size were passed six-month time interval and not completely characterized as per previous discussion.  Given her multiple medical issues and slow interval growth, and recommended a CT abdomen with and without contrast (renal protocol) in 12 month both to assess interval growth as well as fully characterize the lesion.  I feel comfortable increasing the interval to this length due to essentially no demonstrable growth of her passed six-month interval.  Patient understands the plan and is agreeable. She understands the need for follow-up of this and has agreed to return in 12 months.  - CT RENAL ABDOMEN W WO CONTRAST; Future   Return in about 1 year (around 09/08/2017) for CT abd .  Hollice Espy, MD  [facility] [address] [place], [place] [address] [phone]

## 2016-09-09 ENCOUNTER — Inpatient Hospital Stay: Payer: Medicare Other | Attending: Oncology

## 2016-09-13 ENCOUNTER — Telehealth: Payer: Self-pay | Admitting: Pain Medicine

## 2016-09-13 NOTE — Telephone Encounter (Signed)
Having severe cramps total body for 3 days now. Please call asap, what can she do?

## 2016-09-13 NOTE — Telephone Encounter (Signed)
States having muscle cramps over entire body since havinig a procedure on 09-06-16. Requesting to come for appointment to ask for muscle relaxers. Appointment scheduled.

## 2016-09-14 ENCOUNTER — Ambulatory Visit: Payer: Medicare Other | Attending: Nurse Practitioner | Admitting: Nurse Practitioner

## 2016-09-14 ENCOUNTER — Other Ambulatory Visit
Admission: RE | Admit: 2016-09-14 | Discharge: 2016-09-14 | Disposition: A | Discharge status: Home / Self Care | Payer: Medicare Other | Source: Ambulatory Visit | Attending: Gastroenterology | Admitting: Gastroenterology

## 2016-09-14 ENCOUNTER — Encounter: Payer: Self-pay | Admitting: Nurse Practitioner

## 2016-09-14 VITALS — BP 122/71 | HR 78 | Temp 98.2°F | Resp 16 | Ht 69.0 in | Wt 150.0 lb

## 2016-09-14 DIAGNOSIS — E1142 Type 2 diabetes mellitus with diabetic polyneuropathy: Secondary | ICD-10-CM | POA: Insufficient documentation

## 2016-09-14 DIAGNOSIS — K7031 Alcoholic cirrhosis of liver with ascites: Secondary | ICD-10-CM | POA: Insufficient documentation

## 2016-09-14 DIAGNOSIS — M791 Myalgia: Secondary | ICD-10-CM

## 2016-09-14 DIAGNOSIS — M5412 Radiculopathy, cervical region: Secondary | ICD-10-CM | POA: Insufficient documentation

## 2016-09-14 DIAGNOSIS — M7918 Myalgia, other site: Secondary | ICD-10-CM

## 2016-09-14 DIAGNOSIS — M47816 Spondylosis without myelopathy or radiculopathy, lumbar region: Secondary | ICD-10-CM

## 2016-09-14 DIAGNOSIS — B1920 Unspecified viral hepatitis C without hepatic coma: Secondary | ICD-10-CM | POA: Diagnosis not present

## 2016-09-14 DIAGNOSIS — M533 Sacrococcygeal disorders, not elsewhere classified: Secondary | ICD-10-CM | POA: Diagnosis not present

## 2016-09-14 DIAGNOSIS — M5416 Radiculopathy, lumbar region: Secondary | ICD-10-CM | POA: Diagnosis not present

## 2016-09-14 DIAGNOSIS — G894 Chronic pain syndrome: Secondary | ICD-10-CM | POA: Diagnosis present

## 2016-09-14 DIAGNOSIS — K439 Ventral hernia without obstruction or gangrene: Secondary | ICD-10-CM | POA: Insufficient documentation

## 2016-09-14 DIAGNOSIS — E1165 Type 2 diabetes mellitus with hyperglycemia: Secondary | ICD-10-CM | POA: Diagnosis not present

## 2016-09-14 DIAGNOSIS — M1610 Unilateral primary osteoarthritis, unspecified hip: Secondary | ICD-10-CM | POA: Insufficient documentation

## 2016-09-14 DIAGNOSIS — K420 Umbilical hernia with obstruction, without gangrene: Secondary | ICD-10-CM | POA: Diagnosis not present

## 2016-09-14 DIAGNOSIS — M4802 Spinal stenosis, cervical region: Secondary | ICD-10-CM | POA: Insufficient documentation

## 2016-09-14 DIAGNOSIS — G8929 Other chronic pain: Secondary | ICD-10-CM

## 2016-09-14 DIAGNOSIS — M25511 Pain in right shoulder: Secondary | ICD-10-CM | POA: Diagnosis not present

## 2016-09-14 DIAGNOSIS — F1120 Opioid dependence, uncomplicated: Secondary | ICD-10-CM | POA: Insufficient documentation

## 2016-09-14 DIAGNOSIS — D62 Acute posthemorrhagic anemia: Secondary | ICD-10-CM | POA: Diagnosis not present

## 2016-09-14 DIAGNOSIS — M479 Spondylosis, unspecified: Secondary | ICD-10-CM | POA: Diagnosis not present

## 2016-09-14 DIAGNOSIS — D509 Iron deficiency anemia, unspecified: Secondary | ICD-10-CM | POA: Insufficient documentation

## 2016-09-14 DIAGNOSIS — N200 Calculus of kidney: Secondary | ICD-10-CM | POA: Insufficient documentation

## 2016-09-14 DIAGNOSIS — M5442 Lumbago with sciatica, left side: Secondary | ICD-10-CM | POA: Diagnosis not present

## 2016-09-14 DIAGNOSIS — I959 Hypotension, unspecified: Secondary | ICD-10-CM | POA: Diagnosis not present

## 2016-09-14 DIAGNOSIS — G2581 Restless legs syndrome: Secondary | ICD-10-CM | POA: Insufficient documentation

## 2016-09-14 DIAGNOSIS — M546 Pain in thoracic spine: Secondary | ICD-10-CM | POA: Diagnosis not present

## 2016-09-14 DIAGNOSIS — D689 Coagulation defect, unspecified: Secondary | ICD-10-CM | POA: Diagnosis not present

## 2016-09-14 DIAGNOSIS — R339 Retention of urine, unspecified: Secondary | ICD-10-CM | POA: Diagnosis not present

## 2016-09-14 DIAGNOSIS — E440 Moderate protein-calorie malnutrition: Secondary | ICD-10-CM | POA: Insufficient documentation

## 2016-09-14 DIAGNOSIS — F325 Major depressive disorder, single episode, in full remission: Secondary | ICD-10-CM | POA: Insufficient documentation

## 2016-09-14 DIAGNOSIS — M48061 Spinal stenosis, lumbar region without neurogenic claudication: Secondary | ICD-10-CM | POA: Insufficient documentation

## 2016-09-14 LAB — AMMONIA: Ammonia: 12 umol/L (ref 9–35)

## 2016-09-14 MED ORDER — KETOROLAC TROMETHAMINE 60 MG/2ML IM SOLN
60.0000 mg | Freq: Once | INTRAMUSCULAR | Status: AC
  Administered 2016-09-14: 60 mg via INTRAMUSCULAR

## 2016-09-14 MED ORDER — ORPHENADRINE CITRATE 30 MG/ML IJ SOLN
60.0000 mg | Freq: Once | INTRAMUSCULAR | Status: AC
  Administered 2016-09-14: 60 mg via INTRAMUSCULAR

## 2016-09-14 MED ORDER — CYCLOBENZAPRINE HCL 5 MG PO TABS: 5.0000 mg | ORAL_TABLET | Freq: Three times a day (TID) | ORAL | 0 refills | Status: DC | PRN

## 2016-09-14 MED ORDER — ORPHENADRINE CITRATE 30 MG/ML IJ SOLN
INTRAMUSCULAR | Status: AC
  Filled 2016-09-14: qty 2

## 2016-09-14 MED ORDER — KETOROLAC TROMETHAMINE 60 MG/2ML IM SOLN
INTRAMUSCULAR | Status: AC
  Filled 2016-09-14: qty 2

## 2016-09-14 NOTE — Patient Instructions (Signed)
A prescription for Flexeril was sent to your pharmacy.

## 2016-09-14 NOTE — Progress Notes (Signed)
[name] Name: Audrey Garrett  MRN: [id number]  Referring Provider: Jodi Marble, MD  DOB: 03/31/1960  PCP: Jodi Marble, MD  DOS: 09/14/2016  Note by: Vevelyn Francois NP  Service setting: Ambulatory outpatient  Specialty: Interventional Pain Management  Location: [facility] (AMB) Pain Management Facility    Patient type: Established    Primary Reason(s) for Visit: Evaluation of chronic illnesses with exacerbation, or progression (Level of risk: moderate) CC: Back Pain (low, and patient states "all over" muscle spasms)  HPI  Audrey Garrett is a 56 y.o. year old, female patient, who comes today for a follow-up evaluation. Audrey Garrett has Insomnia, persistent; BP (high blood pressure); Cervical radicular pain; Uncomplicated opioid dependence ([place]); Long term prescription opiate use; Failed back surgical syndrome; Lumbar facet syndrome (Bilateral) (L>R); Osteoarthritis of spine with radiculopathy, lumbar region; Musculoskeletal pain; Chronic low back pain (Location of Primary Source of Pain) (Bilateral) (L>R); HCV (hepatitis C virus); At risk for osteopenia; Lumbar spondylosis; Chronic lumbar radicular pain (Location of Secondary source of pain) (Left); Chronic neck pain; Chronic lower extremity pain (Left); Chronic sacroiliac joint pain (B) (R>L); Chronic upper back pain; Long term current use of opiate analgesic; Encounter for chronic pain management; Hypomagnesemia (low magnesium levels); Hypotension; Anemia; Thrombocytopenia ([place]); Coagulopathy ([place]); Hyperglycemia; Polyneuropathy; Urinary retention; Difficulty urinating; Nephrolithiasis; Alcoholic cirrhosis of liver with ascites ([place]); Depression, major, recurrent, moderate ([place]); Abdominal pain, chronic, epigastric; Hepatic cirrhosis ([place]); Acute GI bleeding; Neurogenic pain; S/P thoracolumbar fusion (T5-L5); Grade 1 Retrolisthesis (4 mm) of C5 over C6 and C6 over C7; Cervical foraminal stenosis (Left C5-6; Bilateral C6-7); Hernia of anterior abdominal wall;  Incarcerated umbilical hernia; Controlled type 2 diabetes mellitus with hyperglycemia ([place]); Acute blood loss anemia; Chronic pain syndrome; Malnutrition of moderate degree; Iron deficiency anemia due to chronic blood loss; Opiate use; RLS (restless legs syndrome); Bilateral leg edema; Depression, major, in remission ([place]); Chronic hip pain (Bilateral); Osteoarthritis of hip (Bilateral); Personal history of tobacco use, presenting hazards to health; and Chronic groin pain, left on her problem list. Ms. Cockerham was last seen on Visit date not found. Her primarily concern today is the Back Pain (low, and patient states "all over" muscle spasms)  Pain Assessment: Self-Reported Pain Score: 8 /10             Reported level is compatible with observation.       Pain Location: Back Pain Orientation: Lower Pain Descriptors / Indicators: Sharp, Shooting Pain Frequency: Constant  Audrey Garrett is calm in today for increase lower back pain and muscle spasms. Audrey Garrett admits that Audrey Garrett is having spasms all the way down to the soles of her feet. Audrey Garrett feels like the symptoms are getting worse. Audrey Garrett is having increased bruising in her lower extremities. Audrey Garrett admits that Audrey Garrett is having lab work done external clinic this morning. Audrey Garrett is concerned about getting her pain under control. Further details on both, my assessment(s), as well as the proposed treatment plan, please see below.  Laboratory Chemistry  Inflammation Markers Lab Results  Component Value Date   CRP <0.5 04/03/2015   ESRSEDRATE 27 04/03/2015   (CRP: Acute Phase) (ESR: Chronic Phase) Renal Function Markers Lab Results  Component Value Date   BUN 7 04/12/2016   CREATININE 0.69 04/12/2016   GFRAA >60 04/12/2016   GFRNONAA >60 04/12/2016   Hepatic Function Markers Lab Results  Component Value Date   AST 20 04/12/2016   ALT 9 (L) 04/12/2016   ALBUMIN 3.5 04/12/2016   ALKPHOS 59 04/12/2016  Electrolytes Lab Results  Component Value Date   NA 130 (L)  04/12/2016   K 3.4 (L) 04/12/2016   CL 97 (L) 04/12/2016   CALCIUM 9.3 04/12/2016   MG 1.9 01/22/2016   Neuropathy Markers Lab Results  Component Value Date   VITAMINB12 591 02/13/2016   Bone Pathology Markers Lab Results  Component Value Date   ALKPHOS 59 04/12/2016   CALCIUM 9.3 04/12/2016   Coagulation Parameters Lab Results  Component Value Date   INR 1.21 04/01/2016   LABPROT 15.4 (H) 04/01/2016   APTT 34 01/20/2016   PLT 147 (L) 07/29/2016   Cardiovascular Markers Lab Results  Component Value Date   BNP 929 (H) 09/23/2013   HGB 12.6 07/29/2016   HCT 36.7 07/29/2016   Note: Lab results reviewed.  Recent Diagnostic Imaging Review  US Paracentesis  Result Date: 09/07/2016 INDICATION: Recurrent ascites EXAM: ULTRASOUND GUIDED therapeutic PARACENTESIS MEDICATIONS: None. COMPLICATIONS: None immediate. PROCEDURE: Informed written consent was obtained from the patient after a discussion of the risks, benefits and alternatives to treatment. A timeout was performed prior to the initiation of the procedure. Initial ultrasound scanning demonstrates a large amount of ascites within the right lower abdominal quadrant. The right lower abdomen was prepped and draped in the usual sterile fashion. 1% lidocaine with epinephrine was used for local anesthesia. Following this, a 6 Fr Safe-T-Centesis catheter was introduced under real-time ultrasound guidance. An ultrasound image was saved for documentation purposes. The paracentesis was performed. The catheter was removed and a dressing was applied. The patient tolerated the procedure well without immediate post procedural complication. FINDINGS: A total of approximately 2.95 L of blood tinged fluid was removed. IMPRESSION: Successful ultrasound-guided paracentesis yielding 2.95 liters of peritoneal fluid. Electronically Signed   By: Inez Catalina M.D.   On: 09/07/2016 12:02   Cervical Imaging:  Cervical CT w contrast:  Results for orders  placed during the hospital encounter of 11/29/13  CT Cervical Spine W Contrast   Narrative CLINICAL DATA:  Neck and back pain.  Bilateral lower extremity pain.  FLUOROSCOPY TIME:  1 min 45 seconds  PROCEDURE: LUMBAR PUNCTURE FOR CERVICAL LUMBAR AND THORACIC MYELOGRAM  CERVICAL AND LUMBAR AND THORACIC MYELOGRAM  CT CERVICAL MYELOGRAM  CT LUMBAR MYELOGRAM  CT THORACIC MYELOGRAM  After thorough discussion of risks and benefits of the procedure including bleeding, infection, injury to nerves, blood vessels, adjacent structures as well as headache and CSF leak, written and oral informed consent was obtained. Consent was obtained by Dr. Logan Bores.  Patient was positioned prone on the fluoroscopy table. Local anesthesia was provided with 1% lidocaine without epinephrine after prepped and draped in the usual sterile fashion. Puncture was performed at L3-4 using a 3 1/2 inch 22-gauge spinal needle via a midline approach. Using a single pass through the dura, the needle was placed within the thecal sac, with return of clear CSF. 10 mL Omnipaque-300 was injected into the thecal sac, with normal opacification of the nerve roots and cauda equina consistent with free flow within the subarachnoid space. The patient was then moved to the trendelenburg position and contrast flowed into the Thoracic and Cervical spine regions.  I personally performed the lumbar puncture and administered the intrathecal contrast. I also personally supervised acquisition of the myelogram images.  TECHNIQUE: Contiguous axial images were obtained through the Cervical, Thoracic, and Lumbar spine after the intrathecal infusion of contrast. Coronal and sagittal reconstructions were obtained of the axial image sets.  FINDINGS: CERVICAL, THORACIC AND LUMBAR  MYELOGRAM FINDINGS:  There is slight retrolisthesis of C5 on C6. Moderate disc space narrowing is present at C5-6 and C6-7 with moderate end  plate osteophyte formation, resulting in moderate ventral epidural defects at each level. Thoracic vertebral alignment is normal. Vertebral body heights are preserved without evidence of compression fracture. Extensive posterior spinal fusion has been performed from T5 through the lumbar spine and sacrum with fixation screws in the ilia as well. No thoracic spinal stenosis is identified. There is mild straightening of the normal lumbar lordosis without listhesis on upright neutral, flexion, and extension imaging. Mild disc space narrowing is present at L2-3 and L3-4. Dilated nerve root sleeves are noted bilaterally at S1.  CT CERVICAL MYELOGRAM FINDINGS:  There is straightening of the normal cervical lordosis. There is grade 1 retrolisthesis of C5 on C6 and C6 on C7, measuring approximately 4 mm each. Moderate disc space narrowing and endplate osteophyte formation are present at these 2 levels. The cervical spinal canal is capacious on a developmental basis. The cervical spinal cord is normal in caliber. Mild scarring is noted in the lung apices.  C2-3 through C4-5:  Negative.  C5-6: Broad-based posterior disc osteophyte complex mildly effaces the ventral thecal sac without spinal stenosis. Asymmetric right-sided uncovertebral arthrosis results in moderate right neural foraminal stenosis.  C6-7: Broad-based posterior disc osteophyte complex mildly effaces the ventral thecal sac without spinal canal stenosis. Spinal canal maintains an AP diameter of 10 mm. Uncovertebral arthrosis results in mild bilateral neural foraminal stenosis.  C7-T1: No disc herniation or stenosis. Small left-sided cervical rib which is fused with the proximal aspect of the first thoracic rib.  CT THORACIC MYELOGRAM FINDINGS:  Prior thoracolumbar fusion has been performed. Bilateral pedicle screws are present at T5, T6, T8, T10, and T12. The right T5 screw courses lateral to the pedicle and terminates  lateral to the vertebral body. The left T6 screw traverses the lateral portion of the pedicle, terminating slightly lateral to the vertebral body. The right T12 screw partially traverses the right lateral recess. There is no evidence of screw loosening. Bilateral posterior fusion masses are present.  The thoracic spinal canal is overall capacious. There is a small central disc protrusion at T7-8 which may contact the spinal cord without spinal stenosis or mass effect on the cord. Similar, small central disc protrusions are also seen T8-9 and T9-10. There is mild circumferential disc bulging at T10-11 without spinal stenosis. There is also mild disc bulging at T11-12, without evidence of spinal stenosis although evaluation is partially limited at this level due to dependent layering of contrast in the dorsal canal. There is mild right neural foraminal stenosis at T10-11 due to prominent facet arthrosis.  There are small bilateral pleural effusions. Dependent subsegmental atelectasis is present in both lower lobes.  CT LUMBAR MYELOGRAM FINDINGS:  There is mild straightening of the normal lumbar lordosis. There is no listhesis. Vertebral body heights are preserved. Mild disc space narrowing is present at L2-3 and L3-4. Thoracolumbar fusion hardware is again seen, with bilateral pedicle screws from L1-L5 as well as at S1. Screws also extend into the ilia. There is no evidence of screw loosening. 12 mm, well-circumscribed lucency in the L4 vertebral body may represent a hemangioma. Contrast was primarily layering dependently in the lower lumbar spine and sacral canal during initial supine imaging of the entire spine. Repeat imaging of the lumbar spine with the patient prone yielded better thecal sac opacification. Bilateral S1 and right S2 and S3 nerve root  sleeves are dilated. Moderate atherosclerotic aortic calcification is noted. Punctate calculi are noted in both kidneys without  hydronephrosis.  L1-2: Prior posterior decompression. No disc herniation or stenosis.  L2-3: Prior posterior decompression. Mild circumferential disc bulge without stenosis.  L3-4: Prior posterior decompression. Minimal disc bulging and endplate spurring without stenosis.  L4-5: Prior posterior decompression. Small left foraminal disc protrusion without stenosis.  L5-S1: Prior posterior decompression. Mild disc bulge without stenosis.  IMPRESSION: 1. Moderate cervical spondylosis at C5-6 and C6-7 without cervical spinal stenosis. Uncovertebral arthrosis results in moderate right neural foraminal stenosis at C5-6 and mild bilateral neural foraminal stenosis at C6-7. 2. Prior posterior fusion from T5 through the sacrum as above. Right T12 screw partially traverses the right lateral recess. 3. Mild lower thoracic spondylosis without thoracic spinal stenosis. Mild right neural foraminal stenosis at T10-11 due to asymmetric facet arthrosis. 4. Decompressed lumbar spinal canal. Mild, diffuse lumbar spondylosis without stenosis.   Electronically Signed   By: Logan Bores   On: 11/29/2013 17:04     Thoracic Imaging:  Thoracic CT w contrast:  Results for orders placed during the hospital encounter of 11/29/13  CT Thoracic Spine W Contrast   Narrative CLINICAL DATA:  Neck and back pain.  Bilateral lower extremity pain.  FLUOROSCOPY TIME:  1 min 45 seconds  PROCEDURE: LUMBAR PUNCTURE FOR CERVICAL LUMBAR AND THORACIC MYELOGRAM  CERVICAL AND LUMBAR AND THORACIC MYELOGRAM  CT CERVICAL MYELOGRAM  CT LUMBAR MYELOGRAM  CT THORACIC MYELOGRAM  After thorough discussion of risks and benefits of the procedure including bleeding, infection, injury to nerves, blood vessels, adjacent structures as well as headache and CSF leak, written and oral informed consent was obtained. Consent was obtained by Dr. Logan Bores.  Patient was positioned prone on the fluoroscopy table.  Local anesthesia was provided with 1% lidocaine without epinephrine after prepped and draped in the usual sterile fashion. Puncture was performed at L3-4 using a 3 1/2 inch 22-gauge spinal needle via a midline approach. Using a single pass through the dura, the needle was placed within the thecal sac, with return of clear CSF. 10 mL Omnipaque-300 was injected into the thecal sac, with normal opacification of the nerve roots and cauda equina consistent with free flow within the subarachnoid space. The patient was then moved to the trendelenburg position and contrast flowed into the Thoracic and Cervical spine regions.  I personally performed the lumbar puncture and administered the intrathecal contrast. I also personally supervised acquisition of the myelogram images.  TECHNIQUE: Contiguous axial images were obtained through the Cervical, Thoracic, and Lumbar spine after the intrathecal infusion of contrast. Coronal and sagittal reconstructions were obtained of the axial image sets.  FINDINGS: CERVICAL, THORACIC AND LUMBAR MYELOGRAM FINDINGS:  There is slight retrolisthesis of C5 on C6. Moderate disc space narrowing is present at C5-6 and C6-7 with moderate end plate osteophyte formation, resulting in moderate ventral epidural defects at each level. Thoracic vertebral alignment is normal. Vertebral body heights are preserved without evidence of compression fracture. Extensive posterior spinal fusion has been performed from T5 through the lumbar spine and sacrum with fixation screws in the ilia as well. No thoracic spinal stenosis is identified. There is mild straightening of the normal lumbar lordosis without listhesis on upright neutral, flexion, and extension imaging. Mild disc space narrowing is present at L2-3 and L3-4. Dilated nerve root sleeves are noted bilaterally at S1.  CT CERVICAL MYELOGRAM FINDINGS:  There is straightening of the normal cervical lordosis. There  is  grade 1 retrolisthesis of C5 on C6 and C6 on C7, measuring approximately 4 mm each. Moderate disc space narrowing and endplate osteophyte formation are present at these 2 levels. The cervical spinal canal is capacious on a developmental basis. The cervical spinal cord is normal in caliber. Mild scarring is noted in the lung apices.  C2-3 through C4-5:  Negative.  C5-6: Broad-based posterior disc osteophyte complex mildly effaces the ventral thecal sac without spinal stenosis. Asymmetric right-sided uncovertebral arthrosis results in moderate right neural foraminal stenosis.  C6-7: Broad-based posterior disc osteophyte complex mildly effaces the ventral thecal sac without spinal canal stenosis. Spinal canal maintains an AP diameter of 10 mm. Uncovertebral arthrosis results in mild bilateral neural foraminal stenosis.  C7-T1: No disc herniation or stenosis. Small left-sided cervical rib which is fused with the proximal aspect of the first thoracic rib.  CT THORACIC MYELOGRAM FINDINGS:  Prior thoracolumbar fusion has been performed. Bilateral pedicle screws are present at T5, T6, T8, T10, and T12. The right T5 screw courses lateral to the pedicle and terminates lateral to the vertebral body. The left T6 screw traverses the lateral portion of the pedicle, terminating slightly lateral to the vertebral body. The right T12 screw partially traverses the right lateral recess. There is no evidence of screw loosening. Bilateral posterior fusion masses are present.  The thoracic spinal canal is overall capacious. There is a small central disc protrusion at T7-8 which may contact the spinal cord without spinal stenosis or mass effect on the cord. Similar, small central disc protrusions are also seen T8-9 and T9-10. There is mild circumferential disc bulging at T10-11 without spinal stenosis. There is also mild disc bulging at T11-12, without evidence of spinal stenosis although  evaluation is partially limited at this level due to dependent layering of contrast in the dorsal canal. There is mild right neural foraminal stenosis at T10-11 due to prominent facet arthrosis.  There are small bilateral pleural effusions. Dependent subsegmental atelectasis is present in both lower lobes.  CT LUMBAR MYELOGRAM FINDINGS:  There is mild straightening of the normal lumbar lordosis. There is no listhesis. Vertebral body heights are preserved. Mild disc space narrowing is present at L2-3 and L3-4. Thoracolumbar fusion hardware is again seen, with bilateral pedicle screws from L1-L5 as well as at S1. Screws also extend into the ilia. There is no evidence of screw loosening. 12 mm, well-circumscribed lucency in the L4 vertebral body may represent a hemangioma. Contrast was primarily layering dependently in the lower lumbar spine and sacral canal during initial supine imaging of the entire spine. Repeat imaging of the lumbar spine with the patient prone yielded better thecal sac opacification. Bilateral S1 and right S2 and S3 nerve root sleeves are dilated. Moderate atherosclerotic aortic calcification is noted. Punctate calculi are noted in both kidneys without hydronephrosis.  L1-2: Prior posterior decompression. No disc herniation or stenosis.  L2-3: Prior posterior decompression. Mild circumferential disc bulge without stenosis.  L3-4: Prior posterior decompression. Minimal disc bulging and endplate spurring without stenosis.  L4-5: Prior posterior decompression. Small left foraminal disc protrusion without stenosis.  L5-S1: Prior posterior decompression. Mild disc bulge without stenosis.  IMPRESSION: 1. Moderate cervical spondylosis at C5-6 and C6-7 without cervical spinal stenosis. Uncovertebral arthrosis results in moderate right neural foraminal stenosis at C5-6 and mild bilateral neural foraminal stenosis at C6-7. 2. Prior posterior fusion from T5 through  the sacrum as above. Right T12 screw partially traverses the right lateral recess. 3. Mild lower thoracic spondylosis without  thoracic spinal stenosis. Mild right neural foraminal stenosis at T10-11 due to asymmetric facet arthrosis. 4. Decompressed lumbar spinal canal. Mild, diffuse lumbar spondylosis without stenosis.   Electronically Signed   By: Logan Bores   On: 11/29/2013 17:04    Thoracic DG 2-3 views:  Results for orders placed in visit on 01/06/15  DG Thoracic Spine 2 View   Lumbosacral Imaging:  Lumbar CT wo contrast:  Results for orders placed during the hospital encounter of 09/22/15  CT LUMBAR SPINE WO CONTRAST   Narrative CLINICAL DATA:  Acute exacerbation of chronic back pain.  EXAM: CT LUMBAR SPINE WITHOUT CONTRAST  TECHNIQUE: Multidetector CT imaging of the lumbar spine was performed without intravenous contrast administration. Multiplanar CT image reconstructions were also generated.  COMPARISON:  Lumbar radiographs from 6 10/2015  FINDINGS: There has been previous posterior fusion using pedicle screws and rods from the lower thoracic region to the sacrum and pelvis. Within limits for detection on noncontrast CT lumbar, I do not see an acute fracture, intraspinal hematoma, or lumbar compressive lesion. No gross hardware malposition. Solid posterior arthrodesis through least the L1 through S1 segments.  Aortic atherosclerosis. Renal cortical calcifications, incompletely evaluated. Suspected gallstones.  IMPRESSION: No acute findings status post thoracosacral fusion.   Electronically Signed   By: Staci Righter M.D.   On: 09/25/2015 18:42    Lumbar CT w contrast:  Results for orders placed during the hospital encounter of 11/29/13  CT Lumbar Spine W Contrast   Narrative CLINICAL DATA:  Neck and back pain.  Bilateral lower extremity pain.  FLUOROSCOPY TIME:  1 min 45 seconds  PROCEDURE: LUMBAR PUNCTURE FOR CERVICAL LUMBAR AND THORACIC  MYELOGRAM  CERVICAL AND LUMBAR AND THORACIC MYELOGRAM  CT CERVICAL MYELOGRAM  CT LUMBAR MYELOGRAM  CT THORACIC MYELOGRAM  After thorough discussion of risks and benefits of the procedure including bleeding, infection, injury to nerves, blood vessels, adjacent structures as well as headache and CSF leak, written and oral informed consent was obtained. Consent was obtained by Dr. Logan Bores.  Patient was positioned prone on the fluoroscopy table. Local anesthesia was provided with 1% lidocaine without epinephrine after prepped and draped in the usual sterile fashion. Puncture was performed at L3-4 using a 3 1/2 inch 22-gauge spinal needle via a midline approach. Using a single pass through the dura, the needle was placed within the thecal sac, with return of clear CSF. 10 mL Omnipaque-300 was injected into the thecal sac, with normal opacification of the nerve roots and cauda equina consistent with free flow within the subarachnoid space. The patient was then moved to the trendelenburg position and contrast flowed into the Thoracic and Cervical spine regions.  I personally performed the lumbar puncture and administered the intrathecal contrast. I also personally supervised acquisition of the myelogram images.  TECHNIQUE: Contiguous axial images were obtained through the Cervical, Thoracic, and Lumbar spine after the intrathecal infusion of contrast. Coronal and sagittal reconstructions were obtained of the axial image sets.  FINDINGS: CERVICAL, THORACIC AND LUMBAR MYELOGRAM FINDINGS:  There is slight retrolisthesis of C5 on C6. Moderate disc space narrowing is present at C5-6 and C6-7 with moderate end plate osteophyte formation, resulting in moderate ventral epidural defects at each level. Thoracic vertebral alignment is normal. Vertebral body heights are preserved without evidence of compression fracture. Extensive posterior spinal fusion has been performed from T5  through the lumbar spine and sacrum with fixation screws in the ilia as well. No thoracic spinal stenosis is identified. There  is mild straightening of the normal lumbar lordosis without listhesis on upright neutral, flexion, and extension imaging. Mild disc space narrowing is present at L2-3 and L3-4. Dilated nerve root sleeves are noted bilaterally at S1.  CT CERVICAL MYELOGRAM FINDINGS:  There is straightening of the normal cervical lordosis. There is grade 1 retrolisthesis of C5 on C6 and C6 on C7, measuring approximately 4 mm each. Moderate disc space narrowing and endplate osteophyte formation are present at these 2 levels. The cervical spinal canal is capacious on a developmental basis. The cervical spinal cord is normal in caliber. Mild scarring is noted in the lung apices.  C2-3 through C4-5:  Negative.  C5-6: Broad-based posterior disc osteophyte complex mildly effaces the ventral thecal sac without spinal stenosis. Asymmetric right-sided uncovertebral arthrosis results in moderate right neural foraminal stenosis.  C6-7: Broad-based posterior disc osteophyte complex mildly effaces the ventral thecal sac without spinal canal stenosis. Spinal canal maintains an AP diameter of 10 mm. Uncovertebral arthrosis results in mild bilateral neural foraminal stenosis.  C7-T1: No disc herniation or stenosis. Small left-sided cervical rib which is fused with the proximal aspect of the first thoracic rib.  CT THORACIC MYELOGRAM FINDINGS:  Prior thoracolumbar fusion has been performed. Bilateral pedicle screws are present at T5, T6, T8, T10, and T12. The right T5 screw courses lateral to the pedicle and terminates lateral to the vertebral body. The left T6 screw traverses the lateral portion of the pedicle, terminating slightly lateral to the vertebral body. The right T12 screw partially traverses the right lateral recess. There is no evidence of screw loosening. Bilateral  posterior fusion masses are present.  The thoracic spinal canal is overall capacious. There is a small central disc protrusion at T7-8 which may contact the spinal cord without spinal stenosis or mass effect on the cord. Similar, small central disc protrusions are also seen T8-9 and T9-10. There is mild circumferential disc bulging at T10-11 without spinal stenosis. There is also mild disc bulging at T11-12, without evidence of spinal stenosis although evaluation is partially limited at this level due to dependent layering of contrast in the dorsal canal. There is mild right neural foraminal stenosis at T10-11 due to prominent facet arthrosis.  There are small bilateral pleural effusions. Dependent subsegmental atelectasis is present in both lower lobes.  CT LUMBAR MYELOGRAM FINDINGS:  There is mild straightening of the normal lumbar lordosis. There is no listhesis. Vertebral body heights are preserved. Mild disc space narrowing is present at L2-3 and L3-4. Thoracolumbar fusion hardware is again seen, with bilateral pedicle screws from L1-L5 as well as at S1. Screws also extend into the ilia. There is no evidence of screw loosening. 12 mm, well-circumscribed lucency in the L4 vertebral body may represent a hemangioma. Contrast was primarily layering dependently in the lower lumbar spine and sacral canal during initial supine imaging of the entire spine. Repeat imaging of the lumbar spine with the patient prone yielded better thecal sac opacification. Bilateral S1 and right S2 and S3 nerve root sleeves are dilated. Moderate atherosclerotic aortic calcification is noted. Punctate calculi are noted in both kidneys without hydronephrosis.  L1-2: Prior posterior decompression. No disc herniation or stenosis.  L2-3: Prior posterior decompression. Mild circumferential disc bulge without stenosis.  L3-4: Prior posterior decompression. Minimal disc bulging and endplate spurring without  stenosis.  L4-5: Prior posterior decompression. Small left foraminal disc protrusion without stenosis.  L5-S1: Prior posterior decompression. Mild disc bulge without stenosis.  IMPRESSION: 1. Moderate cervical spondylosis at  C5-6 and C6-7 without cervical spinal stenosis. Uncovertebral arthrosis results in moderate right neural foraminal stenosis at C5-6 and mild bilateral neural foraminal stenosis at C6-7. 2. Prior posterior fusion from T5 through the sacrum as above. Right T12 screw partially traverses the right lateral recess. 3. Mild lower thoracic spondylosis without thoracic spinal stenosis. Mild right neural foraminal stenosis at T10-11 due to asymmetric facet arthrosis. 4. Decompressed lumbar spinal canal. Mild, diffuse lumbar spondylosis without stenosis.   Electronically Signed   By: Logan Bores   On: 11/29/2013 17:04     Lumbar DG 2-3 views:  Results for orders placed during the hospital encounter of 09/04/15  DG Lumbar Spine 2-3 Views   Narrative CLINICAL DATA:  Low back pain since Saturday.  EXAM: LUMBAR SPINE - 2-3 VIEW  COMPARISON:  CT abdomen 01/17/2015  FINDINGS: There is no evidence of lumbar spine fracture. Alignment is anatomic. Mild degenerative disc disease at L2-3, L4-5 and L5-S1. Posterior lumbar fusion from T10 through S1 with bilateral ileal screws. No hardware failure or complication.  IMPRESSION: 1.  No acute osseous injury of the lumbar spine.   Electronically Signed   By: Kathreen Devoid   On: 09/04/2015 15:10     Sacroiliac Joint Imaging: Sacroiliac Joint DG:  Results for orders placed during the hospital encounter of 07/16/16  DG Si Joints   Narrative CLINICAL DATA:  Groin pain.  Prior back surgery.  EXAM: BILATERAL SACROILIAC JOINTS - 3+ VIEW  COMPARISON:  Right hip 07/12/2016.  FINDINGS: Prior lumbosacral and bilateral sacroiliac fusion. Degenerative changes lumbar spine, both SI joints, both hips. No acute  bony abnormality identified. No evidence of fracture.  IMPRESSION: Postsurgical and degenerative change. No acute or focal abnormality.   Electronically Signed   By: Marcello Moores  Register   On: 07/16/2016 14:24   Hip Imaging: Hip-R DG 2-3 views:  Results for orders placed during the hospital encounter of 07/16/16  DG HIP UNILAT W OR W/O PELVIS 2-3 VIEWS RIGHT   Narrative CLINICAL DATA:  Left hip/ groin pain.  EXAM: DG HIP (WITH OR WITHOUT PELVIS) 2-3V RIGHT  COMPARISON:  CT abdomen and pelvis 02/17/2016  FINDINGS: Sequelae of extensive posterior spinal fusion are again identified extending to the sacrum and with bilateral iliac screws present as well. Right hip joint space width is preserved without significant degenerative changes or erosion identified. Bone mineralization appears normal. A moderate amount of colonic stool is partially visualized.  IMPRESSION: Unremarkable appearance of the right hip.   Electronically Signed   By: Logan Bores M.D.   On: 07/16/2016 14:25    Hip-L DG 2-3 views:  Results for orders placed during the hospital encounter of 07/16/16  DG HIP UNILAT W OR W/O PELVIS 2-3 VIEWS LEFT   Narrative CLINICAL DATA:  Left hip/ groin pain.  EXAM: DG HIP (WITH OR WITHOUT PELVIS) 2-3V LEFT  COMPARISON:  CT abdomen and pelvis 02/17/2016  FINDINGS: Sequelae of extensive posterior spinal fusion are again identified extending to the sacrum and with bilateral iliac screws present as well. Left hip joint space width is preserved without significant degenerative changes or erosion identified. Bone mineralization appears normal. A moderate amount of colonic stool is partially visualized.  IMPRESSION: Unremarkable appearance of the left hip.   Electronically Signed   By: Logan Bores M.D.   On: 07/16/2016 14:24       Note: Results of ordered imaging test(s) reviewed and explained to patient in Layman's terms. Copy of results provided to  patient  Meds  The patient has a current medication list which includes the following prescription(s): buspirone, calcium carbonate, ciprofloxacin, furosemide, lactulose, magnesium oxide, multivitamin with minerals, oxycodone hcl, oxycodone hcl, oxycodone hcl, pantoprazole, paroxetine, pregabalin, spironolactone, thiamine, trazodone, and cyclobenzaprine.  Current Outpatient Prescriptions on File Prior to Visit  Medication Sig  . busPIRone (BUSPAR) 7.5 MG tablet Take 7.5 mg by mouth 2 (two) times daily.  . calcium carbonate (TUMS - DOSED IN MG ELEMENTAL CALCIUM) 500 MG chewable tablet Chew 2 tablets by mouth 2 (two) times daily.  . ciprofloxacin (CIPRO) 250 MG tablet TAKE 1 TABLET (250 MG TOTAL) BY MOUTH ONCE DAILY.  . furosemide (LASIX) 40 MG tablet Take by mouth 2 (two) times daily.   Marland Kitchen lactulose (CHRONULAC) 10 GM/15ML solution Take 10 g by mouth 3 (three) times daily.   . Magnesium Oxide 250 MG TABS Take 1 tablet by mouth daily.  . Multiple Vitamin (MULTIVITAMIN WITH MINERALS) TABS tablet Take 1 tablet by mouth daily.  Derrill Memo ON 09/24/2016] Oxycodone HCl 20 MG TABS Take 1 tablet (20 mg total) by mouth every 6 (six) hours as needed.  Derrill Memo ON 10/24/2016] Oxycodone HCl 20 MG TABS Take 1 tablet (20 mg total) by mouth every 6 (six) hours as needed.  . Oxycodone HCl 20 MG TABS Take 1 tablet (20 mg total) by mouth every 6 (six) hours.  . pantoprazole (PROTONIX) 40 MG tablet TAKE 1 TABLET (40 MG TOTAL) BY MOUTH 2 (TWO) TIMES DAILY.  Marland Kitchen PARoxetine (PAXIL) 40 MG tablet Take 40 mg by mouth every morning.  . pregabalin (LYRICA) 75 MG capsule Take 75 mg by mouth 2 (two) times daily.  Marland Kitchen spironolactone (ALDACTONE) 50 MG tablet Take 100 mg by mouth daily.   Marland Kitchen thiamine (VITAMIN B-1) 100 MG tablet Take 1 tablet (100 mg total) by mouth daily.  . traZODone (DESYREL) 50 MG tablet Take 50 mg by mouth at bedtime.   No current facility-administered medications on file prior to visit.    ROS  Constitutional:  Denies any fever or chills Gastrointestinal: No reported hemesis, hematochezia, vomiting, or acute GI distress Musculoskeletal: Denies any acute onset joint swelling, redness, loss of ROM, or weakness Neurological: No reported episodes of acute onset apraxia, aphasia, dysarthria, agnosia, amnesia, paralysis, loss of coordination, or loss of consciousness  Allergies  Audrey Garrett has No Known Allergies.  PFSH  Drug: Audrey Garrett  reports that Audrey Garrett does not use drugs. Alcohol:  reports that Audrey Garrett does not drink alcohol. Tobacco:  reports that Audrey Garrett has been smoking Cigarettes.  Audrey Garrett has a 84.00 pack-year smoking history. Audrey Garrett has never used smokeless tobacco. Medical:  has a past medical history of Alcohol abuse; Alcoholic cirrhosis of liver without ascites ([place]); Anemia; Anxiety; Arthropathy; Back pain; Bronchitis; BRONCHITIS, ACUTE (11/01/2009); Chronic hepatitis C ([place]); Chronic LBP (12/25/2014); Cirrhosis ([place]); DDD (degenerative disc disease), lumbar (01/01/2015); Depression; Diabetes mellitus without complication ([place]); Diverticulosis; Edema; Fatigue; Fibromyalgia; GERD (gastroesophageal reflux disease); High risk medications (not anticoagulants) long-term use; Hyperglycemia; Hypertension; Kidney mass; Left leg pain (01/01/2015); Lumbago; Lumbar radicular pain (01/01/2015); Muscle spasm; Neck pain (01/01/2015); Polyarthritis; Reflux; Restless leg syndrome; RLS (restless legs syndrome); Sacroiliac pain (01/01/2015); Shoulder pain; Sinusitis; SOB (shortness of breath); Spine disorder; Stress headaches; Upper back pain (01/01/2015); Urinary frequency; and Vitamin D deficiency disease. Family: family history includes [name] in her father; Breast cancer (age of onset: 85) in her sister; Heart disease in her mother; Stroke in her mother.  Past Surgical History:  Procedure Laterality Date  .  BACK SURGERY  12/19/2011  . BACK SURGERY    . COLONOSCOPY WITH PROPOFOL N/A 09/25/2015   Procedure: COLONOSCOPY WITH PROPOFOL;   Surgeon: Christena Deem, MD;  Location: [facility] ENDOSCOPY;  Service: Endoscopy;  Laterality: N/A;  . COLONOSCOPY WITH PROPOFOL N/A 04/01/2016   Procedure: COLONOSCOPY WITH PROPOFOL;  Surgeon: Christena Deem, MD;  Location: [facility] ENDOSCOPY;  Service: Endoscopy;  Laterality: N/A;  . ESOPHAGOGASTRODUODENOSCOPY N/A 04/14/2015   Procedure: ESOPHAGOGASTRODUODENOSCOPY (EGD);  Surgeon: Christena Deem, MD;  Location: [facility] ENDOSCOPY;  Service: Endoscopy;  Laterality: N/A;  . ESOPHAGOGASTRODUODENOSCOPY (EGD) WITH PROPOFOL N/A 09/16/2015   Procedure: ESOPHAGOGASTRODUODENOSCOPY (EGD) WITH PROPOFOL;  Surgeon: Christena Deem, MD;  Location[facility] ENDOSCOPY;  Service: Endoscopy;  Laterality: N/A;  . ESOPHAGOGASTRODUODENOSCOPY (EGD) WITH PROPOFOL N/A 01/22/2016   Procedure: ESOPHAGOGASTRODUODENOSCOPY (EGD) WITH PROPOFOL;  Surgeon: Sherrilyn Rist, MD;  Location: [facility] ENDOSCOPY;  Service: Endoscopy;  Laterality: N/A;  . ESOPHAGOGASTRODUODENOSCOPY (EGD) WITH PROPOFOL N/A 04/01/2016   Procedure: ESOPHAGOGASTRODUODENOSCOPY (EGD) WITH PROPOFOL;  Surgeon: Christena Deem, MD;  Location: [facility] ENDOSCOPY;  Service: Endoscopy;  Laterality: N/A;  . HERNIA REPAIR N/A 10/2015   abdominal   . TONSILLECTOMY    . TUBAL LIGATION    . UMBILICAL HERNIA REPAIR N/A 11/06/2015   Procedure: HERNIA REPAIR UMBILICAL ADULT;  Surgeon: Lattie Haw, MD;  Location: [facility] ORS;  Service: General;  Laterality: N/A;   Constitutional Exam  General appearance: Well nourished, well developed, and well hydrated. In no apparent acute distress Vitals:   09/14/16 0917  BP: 122/71  Pulse: 78  Resp: 16  Temp: 98.2 F (36.8 C)  TempSrc: Oral  SpO2: 98%  Weight: 150 lb (68 kg)  Height: 5\' 9"  (1.753 m)   BMI Assessment: Estimated body mass index is 22.15 kg/m as calculated from the following:   Height as of this encounter: 5\' 9"  (1.753 m).   Weight as of this encounter: 150 lb (68 kg).  BMI interpretation table: BMI level Category  Range association with higher incidence of chronic pain  <18 kg/m2 Underweight   18.5-24.9 kg/m2 Ideal body weight   25-29.9 kg/m2 Overweight Increased incidence by 20%  30-34.9 kg/m2 Obese (Class I) Increased incidence by 68%  35-39.9 kg/m2 Severe obesity (Class II) Increased incidence by 136%  >40 kg/m2 Extreme obesity (Class III) Increased incidence by 254%   BMI Readings from Last 4 Encounters:  09/14/16 22.15 kg/m  09/08/16 25.10 kg/m  09/06/16 24.39 kg/m  08/19/16 23.48 kg/m   Wt Readings from Last 4 Encounters:  09/14/16 150 lb (68 kg)  09/08/16 170 lb (77.1 kg)  09/06/16 170 lb (77.1 kg)  08/19/16 159 lb (72.1 kg)  Psych/Mental status: Alert, oriented x 3 (person, place, & time)       Eyes: PERLA Respiratory: No evidence of acute respiratory distress  Cervical Spine Exam  Inspection: No masses, redness, or swelling Alignment: Symmetrical Functional ROM: Unrestricted ROM      Stability: No instability detected Muscle strength & Tone: Functionally intact Sensory: Unimpaired Palpation: No palpable anomalies              Upper Extremity (UE) Exam    Side: Right upper extremity  Side: Left upper extremity  Inspection: No masses, redness, swelling, or asymmetry. No contractures  Inspection: No masses, redness, swelling, or asymmetry. No contractures  Functional ROM: Unrestricted ROM          Functional ROM: Unrestricted ROM          Muscle  strength & Tone: Functionally intact  Muscle strength & Tone: Functionally intact  Sensory: Unimpaired  Sensory: Unimpaired  Palpation: No palpable anomalies              Palpation: No palpable anomalies              Specialized Test(s): Deferred         Specialized Test(s): Deferred          Thoracic Spine Exam  Inspection: No masses, redness, or swelling Alignment: Symmetrical Functional ROM: Unrestricted ROM Stability: No instability detected Sensory: Unimpaired Muscle strength & Tone: No palpable anomalies  Lumbar Spine  Exam  Inspection: No masses, redness, or swelling Alignment: Symmetrical Functional ROM: Pain restricted ROM      Stability: No instability detected Muscle strength & Tone: Functionally intact Sensory: Unimpaired Palpation: No palpable anomalies       Provocative Tests: Lumbar Hyperextension and rotation test: evaluation deferred today       Patrick's Maneuver: evaluation deferred today                    Gait & Posture Assessment  Ambulation: Patient ambulates using a walker Gait: Relatively normal for age and body habitus Posture: Rigid   Lower Extremity Exam    Side: Right lower extremity  Side: Left lower extremity  Inspection: medial ecchymosis   Inspection: medial ecchymosis with erythema   Functional ROM: Unrestricted ROM          Functional ROM: Unrestricted ROM          Muscle strength & Tone: Functionally intact  Muscle strength & Tone: Functionally intact  Sensory: Unimpaired  Sensory: Unimpaired  Palpation: No palpable anomalies  Palpation: No palpable anomalies   Assessment  Primary Diagnosis & Pertinent Problem List: The primary encounter diagnosis was Musculoskeletal pain. Diagnoses of Lumbar spondylosis, Chronic low back pain (Location of Primary Source of Pain) (Bilateral) (L>R), and Chronic pain syndrome were also pertinent to this visit.  Status Diagnosis  Controlled Controlled Controlled 1. Musculoskeletal pain   2. Lumbar spondylosis   3. Chronic low back pain (Location of Primary Source of Pain) (Bilateral) (L>R)   4. Chronic pain syndrome     Problems updated and reviewed during this visit: Problem  Chronic Groin Pain, Left  Osteoarthritis of hip (Bilateral)  Chronic hip pain (Bilateral)  Rls (Restless Legs Syndrome)  Chronic Pain Syndrome  Neurogenic Pain  S/P thoracolumbar fusion (T5-L5)  Grade 1 Retrolisthesis (4 mm) of C5 over C6 and C6 over C7  Cervical foraminal stenosis (Left C5-6; Bilateral C6-7)  Coagulopathy (Hcc)  Polyneuropathy  At  Risk for Osteopenia  Lumbar Spondylosis  Chronic lumbar radicular pain (Location of Secondary source of pain) (Left)  Chronic Neck Pain  Chronic lower extremity pain (Left)  Chronic sacroiliac joint pain (B) (R>L)  Chronic Upper Back Pain  Encounter for Chronic Pain Management  Cervical Radicular Pain  Failed Back Surgical Syndrome  Lumbar facet syndrome (Bilateral) (L>R)  Osteoarthritis of Spine With Radiculopathy, Lumbar Region  Musculoskeletal Pain  Chronic low back pain (Location of Primary Source of Pain) (Bilateral) (L>R)  Hcv (Hepatitis C Virus)  Opiate Use  Long Term Current Use of Opiate Analgesic  Long Term Prescription Opiate Use  Personal History of Tobacco Use, Presenting Hazards to Health  Bilateral Leg Edema  Iron Deficiency Anemia Due to Chronic Blood Loss  Controlled Type 2 Diabetes Mellitus With Hyperglycemia (Hcc)  Acute Blood Loss Anemia  Malnutrition of moderate degree  Hernia of Anterior Abdominal Wall  Incarcerated Umbilical Hernia  Acute GI Bleeding  Depression, Major, Recurrent, Moderate (Hcc)  Abdominal Pain, Chronic, Epigastric  Difficulty Urinating  Nephrolithiasis  Alcoholic Cirrhosis of Liver With Ascites (Hcc)  Hypotension  Anemia  Thrombocytopenia (Hcc)  Hyperglycemia  Urinary Retention  Hepatic Cirrhosis (Hcc)  Hypomagnesemia (low magnesium levels)  Uncomplicated Opioid Dependence (Hcc)  Insomnia, Persistent  Bp (High Blood Pressure)  Depression, Major, in Remission (Hcc)   Plan of Care  Pharmacotherapy (Medications Ordered): Meds ordered this encounter  Medications  . orphenadrine (NORFLEX) injection 60 mg  . ketorolac (TORADOL) injection 60 mg  . cyclobenzaprine (FLEXERIL) 5 MG tablet    Sig: Take 1 tablet (5 mg total) by mouth 3 (three) times daily as needed for muscle spasms.    Dispense:  90 tablet    Refill:  0    Do not place this medication, or any other prescription from our practice, on "Automatic Refill". Patient may  have prescription filled one day early if pharmacy is closed on scheduled refill date.    Order Specific Question:   Supervising Provider    Answer:   [name] [phone]   New Prescriptions   CYCLOBENZAPRINE (FLEXERIL) 5 MG TABLET    Take 1 tablet (5 mg total) by mouth 3 (three) times daily as needed for muscle spasms.   Medications administered today: We administered orphenadrine and ketorolac. Lab-work, procedure(s), and/or referral(s): No orders of the defined types were placed in this encounter.  Imaging and/or referral(s): None  Interventional therapies: Planned, scheduled, and/or pending:   Patient to have labs completed at [facility] clinic    Considering:   Diagnostic bilateral lumbar facet block  Possible bilateral lumbar facet RFA  Diagnostic left caudal Epidural steroid injection + diagnostic epidurogram  Possible Racz procedure  Diagnostic bilateral sacroiliac joint injection  Possible bilateral sacroiliac joint RFA  Diagnostic left cervical epidural steroid injection  Diagnostic bilateral cervical facet block  Possible bilateral cervical facet RFA    Palliative PRN treatment(s):   Diagnostic bilateral lumbar facet block  Diagnostic bilateral intra-articular hip joint injection Diagnostic bilateral sacroiliac joint block   Provider-requested follow-up: No Follow-up on file.  Future Appointments Date Time Provider [place]  10/05/2016 2:30 PM [name], MD [facility] None  10/21/2016 10:30 AM [facility] None  10/21/2016 10:45 AM Sindy Guadeloupe, MD [facility] None  10/22/2016 10:00 AM Flora Lipps, MD [facility] None  11/01/2016 11:00 AM [facility]  11/02/2016 2:00 PM Sindy Guadeloupe, MD [facility] None  11/18/2016 9:15 AM Vevelyn Francois, NP [facility] None  09/09/2017 10:30 AM Hollice Espy, MD [facility] None   Primary Care Physician: Jodi Marble, MD Location: [facility] Outpatient Pain Management Facility Note  by: Vevelyn Francois NP Date: 09/14/2016; Time: 3:27 PM  Pain Score Disclaimer: We use the NRS-11 scale. This is a self-reported, subjective measurement of pain severity with only modest accuracy. It is used primarily to identify changes within a particular patient. It must be understood that outpatient pain scales are significantly less accurate that those used for research, where they can be applied under ideal controlled circumstances with minimal exposure to variables. In reality, the score is likely to be a combination of pain intensity and pain affect, where pain affect describes the degree of emotional arousal or changes in action readiness caused by the sensory experience of pain. Factors such as social and work situation, setting, emotional state, anxiety levels, expectation, and prior pain experience may  influence pain perception and show large inter-individual differences that may also be affected by time variables.  Patient instructions provided during this appointment: Patient Instructions  A prescription for Flexeril was sent to your pharmacy.

## 2016-09-14 NOTE — Progress Notes (Signed)
Safety precautions to be maintained throughout the outpatient stay will include: orient to surroundings, keep bed in low position, maintain call bell within reach at all times, provide assistance with transfer out of bed and ambulation.  

## 2016-09-17 ENCOUNTER — Institutional Professional Consult (permissible substitution): Payer: 59 | Admitting: Internal Medicine

## 2016-10-04 NOTE — Progress Notes (Signed)
[name] Name: Audrey Garrett  MRN: [id number]  Referring Provider: Jodi Marble, MD  DOB: 03/03/1961  PCP: Jodi Marble, MD  DOS: 10/05/2016  Note by: Gaspar Cola, MD  Service setting: Ambulatory outpatient  Specialty: Interventional Pain Management  Location: [facility] (AMB) Pain Management Facility    Patient type: Established   Primary Reason(s) for Visit: Encounter for post-procedure evaluation of chronic illness with mild to moderate exacerbation CC: Back Pain (lower)  HPI  Audrey Garrett is a 56 y.o. year old, female patient, who comes today for a post-procedure evaluation. She has Insomnia, persistent; BP (high blood pressure); Cervical radicular pain; Uncomplicated opioid dependence ([place]); Long term prescription opiate use; Failed back surgical syndrome; Lumbar facet syndrome (Bilateral) (L>R); Osteoarthritis of spine with radiculopathy, lumbar region; Musculoskeletal pain; Chronic low back pain (Location of Primary Source of Pain) (Bilateral) (L>R); HCV (hepatitis C virus); At risk for osteopenia; Lumbar spondylosis; Chronic lumbar radicular pain (Location of Secondary source of pain) (Left); Chronic neck pain; Chronic lower extremity pain (Left); Chronic sacroiliac joint pain (B) (R>L); Chronic upper back pain; Long term current use of opiate analgesic; Encounter for chronic pain management; Hypomagnesemia (low magnesium levels); Hypotension; Anemia; Thrombocytopenia ([place]); Coagulopathy ([place]); Hyperglycemia; Polyneuropathy; Urinary retention; Difficulty urinating; Nephrolithiasis; Alcoholic cirrhosis of liver with ascites ([place]); Depression, major, recurrent, moderate ([place]); Abdominal pain, chronic, epigastric; Hepatic cirrhosis ([place]); Acute GI bleeding; Neurogenic pain; S/P thoracolumbar fusion (T5-L5); Grade 1 Retrolisthesis (4 mm) of C5 over C6 and C6 over C7; Cervical foraminal stenosis (Left C5-6; Bilateral C6-7); Hernia of anterior abdominal wall; Incarcerated umbilical hernia;  Controlled type 2 diabetes mellitus with hyperglycemia ([place]); Acute blood loss anemia; Chronic pain syndrome; Malnutrition of moderate degree; Iron deficiency anemia due to chronic blood loss; Opiate use; RLS (restless legs syndrome); Bilateral leg edema; Depression, major, in remission ([place]); Chronic hip pain (Bilateral); Osteoarthritis of hip (Bilateral); Personal history of tobacco use, presenting hazards to health; and Chronic groin pain, left on her problem list. Her primarily concern today is the Back Pain (lower)  Pain Assessment: Location: Lower Back Radiating: radiates around to groin on left then down front of leg knee Onset: More than a month ago Duration:  More than 3 months Quality: Sharp, Shooting Severity: 4 /10 (self-reported pain score)  Note: Reported level is inconsistent with clinical observations. Clinically the patient looks like a 2/10 Information on the proper use of the pain scale provided to the patient today Effect on ADL: not able to do what she use to--- after procedures pt starts having cramps in hands and feet- has done thru body- last about 10 mintues- started about a few days after 2 last procedures Timing: Constant Modifying factors: medications- standing up  Audrey Garrett comes in today for post-procedure evaluation after the treatment done on 09/13/2016.  Further details on both, my assessment(s), as well as the proposed treatment plan, please see below.  Post-Procedure Assessment  09/13/2016 Procedure: Diagnostic bilateral lumbar facet block + sacroiliac joint block under fluoroscopic guidance and IV sedation Pre-procedure pain score:  10/10 Post-procedure pain score: 0/10 (100% relief) Influential Factors: BMI: 20.81 kg/m Intra-procedural challenges: None observed Assessment challenges: None detected         Post-procedural adverse reactions or complications: None reported Reported side-effects: None  Sedation: Sedation provided. When no sedatives are  used, the analgesic levels obtained are directly associated to the effectiveness of the local anesthetics. However, when sedation is provided, the level of analgesia obtained during the initial 1 hour following  the intervention, is believed to be the result of a combination of factors. These factors may include, but are not limited to: 1. The effectiveness of the local anesthetics used. 2. The effects of the analgesic(s) and/or anxiolytic(s) used. 3. The degree of discomfort experienced by the patient at the time of the procedure. 4. The patients ability and reliability in recalling and recording the events. 5. The presence and influence of possible secondary gains and/or psychosocial factors. Reported result: Relief experienced during the 1st hour after the procedure: 60 % (Ultra-Short Term Relief) Interpretative annotation: Analgesia during this period is likely to be Local Anesthetic and/or IV Sedative (Analgesic/Anxiolitic) related.          Effects of local anesthetic: The analgesic effects attained during this period are directly associated to the localized infiltration of local anesthetics and therefore cary significant diagnostic value as to the etiological location, or anatomical origin, of the pain. Expected duration of relief is directly dependent on the pharmacodynamics of the local anesthetic used. Long-acting (4-6 hours) anesthetics used.  Reported result: Relief during the next 4 to 6 hour after the procedure: 10 % (Short-Term Relief) Interpretative annotation: Complete relief would suggest area to be the source of the pain.          Long-term benefit: Defined as the period of time past the expected duration of local anesthetics. With the possible exception of prolonged sympathetic blockade from the local anesthetics, benefits during this period are typically attributed to, or associated with, other factors such as analgesic sensory neuropraxia, antiinflammatory effects, or beneficial  biochemical changes provided by agents other than the local anesthetics Reported result: Extended relief following procedure: 0 % (Long-Term Relief) Interpretative annotation: No long-term benefit. This could suggest limited inflammatory component to the pain with possible mechanical aggravating factors.          Current benefits: Defined as persistent relief that continues at this point in time.   Reported results: Treated area: 0 % Ms. Nickle reports improvement in function Interpretative annotation: Recurrance of symptoms. This would suggest persistent aggravating factors  Interpretation: Results would suggest a successful diagnostic intervention.             Laboratory Chemistry  Inflammation Markers (CRP: Acute Phase) (ESR: Chronic Phase) Lab Results  Component Value Date   CRP <0.5 04/03/2015   ESRSEDRATE 27 04/03/2015                 Renal Function Markers Lab Results  Component Value Date   BUN 7 04/12/2016   CREATININE 0.69 04/12/2016   GFRAA >60 04/12/2016   GFRNONAA >60 04/12/2016                 Hepatic Function Markers Lab Results  Component Value Date   AST 20 04/12/2016   ALT 9 (L) 04/12/2016   ALBUMIN 3.5 04/12/2016   ALKPHOS 59 04/12/2016                 Electrolytes Lab Results  Component Value Date   NA 130 (L) 04/12/2016   K 3.4 (L) 04/12/2016   CL 97 (L) 04/12/2016   CALCIUM 9.3 04/12/2016   MG 1.9 01/22/2016                 Neuropathy Markers Lab Results  Component Value Date   VITAMINB12 591 02/13/2016                 Bone Pathology Markers Lab Results  Component Value Date  ALKPHOS 59 04/12/2016   CALCIUM 9.3 04/12/2016                 Coagulation Parameters Lab Results  Component Value Date   INR 1.21 04/01/2016   LABPROT 15.4 (H) 04/01/2016   APTT 34 01/20/2016   PLT 147 (L) 07/29/2016                 Cardiovascular Markers Lab Results  Component Value Date   BNP 929 (H) 09/23/2013   HGB 12.6 07/29/2016   HCT 36.7  07/29/2016                 Note: Lab results reviewed.  Recent Diagnostic Imaging Review  No results found. Note: Imaging results reviewed.          Meds   Current Meds  Medication Sig  . busPIRone (BUSPAR) 15 MG tablet Take 15 mg by mouth 2 (two) times daily.  . calcium carbonate (TUMS - DOSED IN MG ELEMENTAL CALCIUM) 500 MG chewable tablet Chew 2 tablets by mouth 2 (two) times daily.  . cyclobenzaprine (FLEXERIL) 5 MG tablet Take 1 tablet (5 mg total) by mouth 3 (three) times daily as needed for muscle spasms.  . furosemide (LASIX) 40 MG tablet Take by mouth 2 (two) times daily.   Marland Kitchen lactulose (CHRONULAC) 10 GM/15ML solution Take 10 g by mouth 3 (three) times daily.   . Magnesium Oxide 250 MG TABS Take 1 tablet by mouth daily.  . Multiple Vitamin (MULTIVITAMIN WITH MINERALS) TABS tablet Take 1 tablet by mouth daily.  . Oxycodone HCl 20 MG TABS Take 1 tablet (20 mg total) by mouth every 6 (six) hours as needed.  Derrill Memo ON 10/24/2016] Oxycodone HCl 20 MG TABS Take 1 tablet (20 mg total) by mouth every 6 (six) hours as needed.  . pantoprazole (PROTONIX) 40 MG tablet TAKE 1 TABLET (40 MG TOTAL) BY MOUTH 2 (TWO) TIMES DAILY.  Marland Kitchen PARoxetine (PAXIL) 40 MG tablet Take 40 mg by mouth every morning.  . pregabalin (LYRICA) 75 MG capsule Take 75 mg by mouth 2 (two) times daily.  Marland Kitchen spironolactone (ALDACTONE) 50 MG tablet TAKE 2 TABLETS BY MOUTH EVERY DAY  . thiamine (VITAMIN B-1) 100 MG tablet Take 1 tablet (100 mg total) by mouth daily.  . traZODone (DESYREL) 50 MG tablet Take 50 mg by mouth at bedtime.    ROS  Constitutional: Denies any fever or chills Gastrointestinal: No reported hemesis, hematochezia, vomiting, or acute GI distress Musculoskeletal: Denies any acute onset joint swelling, redness, loss of ROM, or weakness Neurological: No reported episodes of acute onset apraxia, aphasia, dysarthria, agnosia, amnesia, paralysis, loss of coordination, or loss of consciousness  Allergies    Ms. Bugh has No Known Allergies.  PFSH  Drug: Ms. Yontz  reports that she does not use drugs. Alcohol:  reports that she does not drink alcohol. Tobacco:  reports that she has been smoking Cigarettes.  She has a 84.00 pack-year smoking history. She has never used smokeless tobacco. Medical:  has a past medical history of Alcohol abuse; Alcoholic cirrhosis of liver without ascites ([place]); Anemia; Anxiety; Arthropathy; Back pain; Bronchitis; BRONCHITIS, ACUTE (11/01/2009); Chronic hepatitis C ([place]); Chronic LBP (12/25/2014); Cirrhosis ([place]); DDD (degenerative disc disease), lumbar (01/01/2015); Depression; Diabetes mellitus without complication ([place]); Diverticulosis; Edema; Fatigue; Fibromyalgia; GERD (gastroesophageal reflux disease); High risk medications (not anticoagulants) long-term use; Hyperglycemia; Hypertension; Kidney mass; Left leg pain (01/01/2015); Lumbago; Lumbar radicular pain (01/01/2015); Muscle spasm; Neck pain (01/01/2015); Polyarthritis;  Reflux; Restless leg syndrome; RLS (restless legs syndrome); Sacroiliac pain (01/01/2015); Shoulder pain; Sinusitis; SOB (shortness of breath); Spine disorder; Stress headaches; Upper back pain (01/01/2015); Urinary frequency; and Vitamin D deficiency disease. Surgical: Ms. Barua  has a past surgical history that includes Tubal ligation; Tonsillectomy; Back surgery (12/19/2011); Esophagogastroduodenoscopy (N/A, 04/14/2015); Esophagogastroduodenoscopy (egd) with propofol (N/A, 09/16/2015); Colonoscopy with propofol (N/A, 09/25/2015); Umbilical hernia repair (N/A, 11/06/2015); Hernia repair (N/A, 10/2015); Esophagogastroduodenoscopy (egd) with propofol (N/A, 01/22/2016); Back surgery; Colonoscopy with propofol (N/A, 04/01/2016); and Esophagogastroduodenoscopy (egd) with propofol (N/A, 04/01/2016). Family: family history includes [name] in her father; Breast cancer (age of onset: 95) in her sister; Heart disease in her mother; Stroke in her mother.  Constitutional Exam   General appearance: Well nourished, well developed, and well hydrated. In no apparent acute distress Vitals:   10/05/16 1454  BP: 121/73  Pulse: 85  Resp: 16  Temp: 98.1 F (36.7 C)  SpO2: 98%  Weight: 145 lb (65.8 kg)  Height: _0  (1.778 m)   BMI Assessment: Estimated body mass index is 20.81 kg/m as calculated from the following:   Height as of this encounter: _1  (1.778 m).   Weight as of this encounter: 145 lb (65.8 kg).  BMI interpretation table: BMI level Category Range association with higher incidence of chronic pain  <18 kg/m2 Underweight   18.5-24.9 kg/m2 Ideal body weight   25-29.9 kg/m2 Overweight Increased incidence by 20%  30-34.9 kg/m2 Obese (Class I) Increased incidence by 68%  35-39.9 kg/m2 Severe obesity (Class II) Increased incidence by 136%  >40 kg/m2 Extreme obesity (Class III) Increased incidence by 254%   BMI Readings from Last 4 Encounters:  10/05/16 20.81 kg/m  09/14/16 22.15 kg/m  09/08/16 25.10 kg/m  09/06/16 24.39 kg/m   Wt Readings from Last 4 Encounters:  10/05/16 145 lb (65.8 kg)  09/14/16 150 lb (68 kg)  09/08/16 170 lb (77.1 kg)  09/06/16 170 lb (77.1 kg)  Psych/Mental status: Alert, oriented x 3 (person, place, & time)       Eyes: PERLA Respiratory: No evidence of acute respiratory distress  Cervical Spine Exam  Inspection: No masses, redness, or swelling Alignment: Symmetrical Functional ROM: Unrestricted ROM      Stability: No instability detected Muscle strength & Tone: Functionally intact Sensory: Unimpaired Palpation: No palpable anomalies              Upper Extremity (UE) Exam    Side: Right upper extremity  Side: Left upper extremity  Inspection: No masses, redness, swelling, or asymmetry. No contractures  Inspection: No masses, redness, swelling, or asymmetry. No contractures  Functional ROM: Unrestricted ROM          Functional ROM: Unrestricted ROM          Muscle strength & Tone: Functionally intact  Muscle  strength & Tone: Functionally intact  Sensory: Unimpaired  Sensory: Unimpaired  Palpation: No palpable anomalies              Palpation: No palpable anomalies              Specialized Test(s): Deferred         Specialized Test(s): Deferred          Thoracic Spine Exam  Inspection: No masses, redness, or swelling Alignment: Symmetrical Functional ROM: Unrestricted ROM Stability: No instability detected Sensory: Unimpaired Muscle strength & Tone: No palpable anomalies  Lumbar Spine Exam  Inspection: No masses, redness, or swelling Alignment: Symmetrical Functional ROM: Unrestricted ROM  Stability: No instability detected Muscle strength & Tone: Functionally intact Sensory: Unimpaired Palpation: No palpable anomalies       Provocative Tests: Lumbar Hyperextension and rotation test: evaluation deferred today       Patrick's Maneuver: evaluation deferred today                    Gait & Posture Assessment  Ambulation: Unassisted Gait: Relatively normal for age and body habitus Posture: WNL   Lower Extremity Exam    Side: Right lower extremity  Side: Left lower extremity  Inspection: No masses, redness, swelling, or asymmetry. No contractures  Inspection: No masses, redness, swelling, or asymmetry. No contractures  Functional ROM: Unrestricted ROM          Functional ROM: Unrestricted ROM          Muscle strength & Tone: Functionally intact  Muscle strength & Tone: Functionally intact  Sensory: Unimpaired  Sensory: Unimpaired  Palpation: No palpable anomalies  Palpation: No palpable anomalies   Assessment  Primary Diagnosis & Pertinent Problem List: The primary encounter diagnosis was Chronic low back pain (Location of Primary Source of Pain) (Bilateral) (L>R). Diagnoses of Lumbar facet syndrome (Bilateral) (L>R) and Chronic sacroiliac joint pain (B) (R>L) were also pertinent to this visit.  Status Diagnosis  Controlled Controlled Controlled 1. Chronic low back pain  (Location of Primary Source of Pain) (Bilateral) (L>R)   2. Lumbar facet syndrome (Bilateral) (L>R)   3. Chronic sacroiliac joint pain (B) (R>L)     Problems updated and reviewed during this visit: No problems updated. Plan of Care  Pharmacotherapy (Medications Ordered): No orders of the defined types were placed in this encounter.  New Prescriptions   No medications on file   Medications administered today: Ms. Graw had no medications administered during this visit.  Lab-work, procedure(s), and/or referral(s): Orders Placed This Encounter  Procedures  . Radiofrequency,Lumbar  . Radiofrequency Sacroiliac Joint    Interventional management options: Planned, scheduled, and/or pending:   Theraputic Left lumbar facet + SI RFA under fluoro and IV sedation.   Considering:   Diagnostic bilateral lumbar facetblock  Possible bilateral lumbar facet RFA Diagnostic left caudal Epiduralsteroid injection + diagnostic epidurogram Possible Racz procedure Diagnostic bilateral sacroiliacjoint injection  Possible bilateral sacroiliac joint RFA Diagnostic left cervical epiduralsteroid injection  Diagnostic bilateral cervical facetblock  Possible bilateral cervical facet RFA   Palliative PRN treatment(s):   Diagnostic bilateral lumbar facet block Diagnostic bilateral intra-articular hipjoint injection Diagnostic bilateral sacroiliacjoint block   Provider-requested follow-up: Return for procedure (w/ sedation), RFA, by MD, in addition, Med-Mgmt, by NP.  Future Appointments Date Time Provider [place]  10/21/2016 10:30 AM [facility] None  10/21/2016 10:45 AM Sindy Guadeloupe, MD [facility] None  10/22/2016 10:00 AM Flora Lipps, MD [facility] None  11/01/2016 11:00 AM [facility]  11/02/2016 2:00 PM Sindy Guadeloupe, MD [facility] None  11/18/2016 9:15 AM Vevelyn Francois, NP [facility] None  01/04/2017 10:15 AM [name], MD [facility]  None  09/09/2017 10:30 AM Hollice Espy, MD [facility] None   Primary Care Physician: Jodi Marble, MD Location: [facility] Note by: Gaspar Cola, MD Date: 10/05/2016; Time: 4:12 PM  Patient Instructions   ____________________________________________________________________________________________  Preparing for Procedure with Sedation Instructions: . Oral Intake: Do not eat or drink anything for at least 8 hours prior to your procedure. . Transportation: Public transportation is not allowed. Bring an adult driver. The driver must be physically present  in our waiting room before any procedure can be started. Marland Kitchen Physical Assistance: Bring an adult physically capable of assisting you, in the event you need help. This adult should keep you company at home for at least 6 hours after the procedure. . Blood Pressure Medicine: Take your blood pressure medicine with a sip of water the morning of the procedure. . Blood thinners:  . Diabetics on insulin: Notify the staff so that you can be scheduled 1st case in the morning. If your diabetes requires high dose insulin, take only  of your normal insulin dose the morning of the procedure and notify the staff that you have done so. . Preventing infections: Shower with an antibacterial soap the morning of your procedure. . Build-up your immune system: Take 1000 mg of Vitamin C with every meal (3 times a day) the day prior to your procedure. Marland Kitchen Antibiotics: Inform the staff if you have a condition or reason that requires you to take antibiotics before dental procedures. . Pregnancy: If you are pregnant, call and cancel the procedure. . Sickness: If you have a cold, fever, or any active infections, call and cancel the procedure. . Arrival: You must be in the facility at least 30 minutes prior to your scheduled procedure. . Children: Do not bring children with you. . Dress appropriately: Bring dark clothing that you  would not mind if they get stained. . Valuables: Do not bring any jewelry or valuables. Procedure appointments are reserved for interventional treatments only. Marland Kitchen No Prescription Refills. . No medication changes will be discussed during procedure appointments. . No disability issues will be discussed. ____________________________________________________________________________________________  Radiofrequency Lesioning Radiofrequency lesioning is a procedure that is performed to relieve pain. The procedure is often used for back, neck, or arm pain. Radiofrequency lesioning involves the use of a machine that creates radio waves to make heat. During the procedure, the heat is applied to the nerve that carries the pain signal. The heat damages the nerve and interferes with the pain signal. Pain relief usually starts about 2 weeks after the procedure and lasts for 6 months to 1 year. Tell a health care provider about:  Any allergies you have.  All medicines you are taking, including vitamins, herbs, eye drops, creams, and over-the-counter medicines.  Any problems you or family members have had with anesthetic medicines.  Any blood disorders you have.  Any surgeries you have had.  Any medical conditions you have.  Whether you are pregnant or may be pregnant. What are the risks? Generally, this is a safe procedure. However, problems may occur, including:  Pain or soreness at the injection site.  Infection at the injection site.  Damage to nerves or blood vessels.  What happens before the procedure?  Ask your health care provider about: ? Changing or stopping your regular medicines. This is especially important if you are taking diabetes medicines or blood thinners. ? Taking medicines such as aspirin and ibuprofen. These medicines can thin your blood. Do not take these medicines before your procedure if your health care provider instructs you not to.  Follow instructions from your  health care provider about eating or drinking restrictions.  Plan to have someone take you home after the procedure.  If you go home right after the procedure, plan to have someone with you for 24 hours. What happens during the procedure?  You will be given one or more of the following: ? A medicine to help you relax (sedative). ? A medicine to numb  the area (local anesthetic).  You will be awake during the procedure. You will need to be able to talk with the health care provider during the procedure.  With the help of a type of X-ray (fluoroscopy), the health care provider will insert a radiofrequency needle into the area to be treated.  Next, a wire that carries the radio waves (electrode) will be put through the radiofrequency needle. An electrical pulse will be sent through the electrode to verify the correct nerve. You will feel a tingling sensation, and you may have muscle twitching.  Then, the tissue that is around the needle tip will be heated by an electric current that is passed using the radiofrequency machine. This will numb the nerves.  A bandage (dressing) will be put on the insertion area after the procedure is done. The procedure may vary among health care providers and hospitals. What happens after the procedure?  Your blood pressure, heart rate, breathing rate, and blood oxygen level will be monitored often until the medicines you were given have worn off.  Return to your normal activities as directed by your health care provider. This information is not intended to replace advice given to you by your health care provider. Make sure you discuss any questions you have with your health care provider. Document Released: 11/11/2010 Document Revised: 08/21/2015 Document Reviewed: 04/22/2014 Elsevier Interactive Patient Education  2018 [place]  What are the risk, side effects and possible complications? Generally speaking, most  procedures are safe.  However, with any procedure there are risks, side effects, and the possibility of complications.  The risks and complications are dependent upon the sites that are lesioned, or the type of nerve block to be performed.  The closer the procedure is to the spine, the more serious the risks are.  Great care is taken when placing the radio frequency needles, block needles or lesioning probes, but sometimes complications can occur. 1. Infection: Any time there is an injection through the skin, there is a risk of infection.  This is why sterile conditions are used for these blocks.  There are four possible types of infection. 1. Localized skin infection. 2. Central Nervous System Infection-This can be in the form of Meningitis, which can be deadly. 3. Epidural Infections-This can be in the form of an epidural abscess, which can cause pressure inside of the spine, causing compression of the spinal cord with subsequent paralysis. This would require an emergency surgery to decompress, and there are no guarantees that the patient would recover from the paralysis. 4. Discitis-This is an infection of the intervertebral discs.  It occurs in about 1% of discography procedures.  It is difficult to treat and it may lead to surgery.        2. Pain: the needles have to go through skin and soft tissues, will cause soreness.       3. Damage to internal structures:  The nerves to be lesioned may be near blood vessels or    other nerves which can be potentially damaged.       4. Bleeding: Bleeding is more common if the patient is taking blood thinners such as  aspirin, Coumadin, Ticiid, Plavix, etc., or if he/she have some genetic predisposition  such as hemophilia. Bleeding into the spinal canal can cause compression of the spinal  cord with subsequent paralysis.  This would require an emergency surgery to  decompress and there are no guarantees that the patient would recover from  the  paralysis.        5. Pneumothorax:  Puncturing of a lung is a possibility, every time a needle is introduced in  the area of the chest or upper back.  Pneumothorax refers to free air around the  collapsed lung(s), inside of the thoracic cavity (chest cavity).  Another two possible  complications related to a similar event would include: Hemothorax and Chylothorax.   These are variations of the Pneumothorax, where instead of air around the collapsed  lung(s), you may have blood or chyle, respectively.       6. Spinal headaches: They may occur with any procedures in the area of the spine.       7. Persistent CSF (Cerebro-Spinal Fluid) leakage: This is a rare problem, but may occur  with prolonged intrathecal or epidural catheters either due to the formation of a fistulous  track or a dural tear.       8. Nerve damage: By working so close to the spinal cord, there is always a possibility of  nerve damage, which could be as serious as a permanent spinal cord injury with  paralysis.       9. Death:  Although rare, severe deadly allergic reactions known as "Anaphylactic  reaction" can occur to any of the medications used.      10. Worsening of the symptoms:  We can always make thing worse.  What are the chances of something like this happening? Chances of any of this occuring are extremely low.  By statistics, you have more of a chance of getting killed in a motor vehicle accident: while driving to the hospital than any of the above occurring .  Nevertheless, you should be aware that they are possibilities.  In general, it is similar to taking a shower.  Everybody knows that you can slip, hit your head and get killed.  Does that mean that you should not shower again?  Nevertheless always keep in mind that statistics do not mean anything if you happen to be on the wrong side of them.  Even if a procedure has a 1 (one) in a 1,000,000 (million) chance of going wrong, it you happen to be that one..Also, keep in mind that by statistics,  you have more of a chance of having something go wrong when taking medications.  Who should not have this procedure? If you are on a blood thinning medication (e.g. Coumadin, Plavix, see list of "Blood Thinners"), or if you have an active infection going on, you should not have the procedure.  If you are taking any blood thinners, please inform your physician.  How should I prepare for this procedure?  Do not eat or drink anything at least six hours prior to the procedure.  Bring a driver with you .  It cannot be a taxi.  Come accompanied by an adult that can drive you back, and that is strong enough to help you if your legs get weak or numb from the local anesthetic.  Take all of your medicines the morning of the procedure with just enough water to swallow them.  If you have diabetes, make sure that you are scheduled to have your procedure done first thing in the morning, whenever possible.  If you have diabetes, take only half of your insulin dose and notify our nurse that you have done so as soon as you arrive at the clinic.  If you are diabetic, but only take blood sugar pills (oral hypoglycemic), then  do not take them on the morning of your procedure.  You may take them after you have had the procedure.  Do not take aspirin or any aspirin-containing medications, at least eleven (11) days prior to the procedure.  They may prolong bleeding.  Wear loose fitting clothing that may be easy to take off and that you would not mind if it got stained with Betadine or blood.  Do not wear any jewelry or perfume  Remove any nail coloring.  It will interfere with some of our monitoring equipment.  NOTE: Remember that this is not meant to be interpreted as a complete list of all possible complications.  Unforeseen problems may occur.  BLOOD THINNERS The following drugs contain aspirin or other products, which can cause increased bleeding during surgery and should not be taken for 2 weeks prior  to and 1 week after surgery.  If you should need take something for relief of minor pain, you may take acetaminophen which is found in Tylenol,m Datril, Anacin-3 and Panadol. It is not blood thinner. The products listed below are.  Do not take any of the products listed below in addition to any listed on your instruction sheet.  A.P.C or A.P.C with Codeine Codeine Phosphate Capsules #3 Ibuprofen Ridaura  ABC compound Congesprin Imuran rimadil  Advil Cope Indocin Robaxisal  Alka-Seltzer Effervescent Pain Reliever and Antacid Coricidin or Coricidin-D  Indomethacin Rufen  Alka-Seltzer plus Cold Medicine Cosprin Ketoprofen S-A-C Tablets  Anacin Analgesic Tablets or Capsules Coumadin Korlgesic Salflex  Anacin Extra Strength Analgesic tablets or capsules CP-2 Tablets Lanoril Salicylate  Anaprox Cuprimine Capsules Levenox Salocol  Anexsia-D Dalteparin Magan Salsalate  Anodynos Darvon compound Magnesium Salicylate Sine-off  Ansaid Dasin Capsules Magsal Sodium Salicylate  Anturane Depen Capsules Marnal Soma  APF Arthritis pain formula Dewitt's Pills Measurin Stanback  Argesic Dia-Gesic Meclofenamic Sulfinpyrazone  Arthritis Bayer Timed Release Aspirin Diclofenac Meclomen Sulindac  Arthritis pain formula Anacin Dicumarol Medipren Supac  Analgesic (Safety coated) Arthralgen Diffunasal Mefanamic Suprofen  Arthritis Strength Bufferin Dihydrocodeine Mepro Compound Suprol  Arthropan liquid Dopirydamole Methcarbomol with Aspirin Synalgos  ASA tablets/Enseals Disalcid Micrainin Tagament  Ascriptin Doan's Midol Talwin  Ascriptin A/D Dolene Mobidin Tanderil  Ascriptin Extra Strength Dolobid Moblgesic Ticlid  Ascriptin with Codeine Doloprin or Doloprin with Codeine Momentum Tolectin  Asperbuf Duoprin Mono-gesic Trendar  Aspergum Duradyne Motrin or Motrin IB Triminicin  Aspirin plain, buffered or enteric coated Durasal Myochrisine Trigesic  Aspirin Suppositories Easprin Nalfon Trillsate  Aspirin with  Codeine Ecotrin Regular or Extra Strength Naprosyn Uracel  Atromid-S Efficin Naproxen Ursinus  Auranofin Capsules Elmiron Neocylate Vanquish  Axotal Emagrin Norgesic Verin  Azathioprine Empirin or Empirin with Codeine Normiflo Vitamin E  Azolid Emprazil Nuprin Voltaren  Bayer Aspirin plain, buffered or children's or timed BC Tablets or powders Encaprin Orgaran Warfarin Sodium  Buff-a-Comp Enoxaparin Orudis Zorpin  Buff-a-Comp with Codeine Equegesic Os-Cal-Gesic   Buffaprin Excedrin plain, buffered or Extra Strength Oxalid   Bufferin Arthritis Strength Feldene Oxphenbutazone   Bufferin plain or Extra Strength Feldene Capsules Oxycodone with Aspirin   Bufferin with Codeine Fenoprofen Fenoprofen Pabalate or Pabalate-SF   Buffets II Flogesic Panagesic   Buffinol plain or Extra Strength Florinal or Florinal with Codeine Panwarfarin   Buf-Tabs Flurbiprofen Penicillamine   Butalbital Compound Four-way cold tablets Penicillin   Butazolidin Fragmin Pepto-Bismol   Carbenicillin Geminisyn Percodan   Carna Arthritis Reliever Geopen Persantine   Carprofen Gold's salt Persistin   Chloramphenicol Goody's Phenylbutazone   Chloromycetin Haltrain Piroxlcam   Clmetidine heparin  Plaquenil   Cllnoril Hyco-pap Ponstel   Clofibrate Hydroxy chloroquine Propoxyphen         Before stopping any of these medications, be sure to consult the physician who ordered them.  Some, such as Coumadin (Warfarin) are ordered to prevent or treat serious conditions such as "deep thrombosis", "pumonary embolisms", and other heart problems.  The amount of time that you may need off of the medication may also vary with the medication and the reason for which you were taking it.  If you are taking any of these medications, please make sure you notify your pain physician before you undergo any procedures.         Facet Blocks Patient Information  Description: The facets are joints in the spine between the vertebrae.  Like  any joints in the body, facets can become irritated and painful.  Arthritis can also effect the facets.  By injecting steroids and local anesthetic in and around these joints, we can temporarily block the nerve supply to them.  Steroids act directly on irritated nerves and tissues to reduce selling and inflammation which often leads to decreased pain.  Facet blocks may be done anywhere along the spine from the neck to the low back depending upon the location of your pain.   After numbing the skin with local anesthetic (like Novocaine), a small needle is passed onto the facet joints under x-ray guidance.  You may experience a sensation of pressure while this is being done.  The entire block usually lasts about 15-25 minutes.   Conditions which may be treated by facet blocks:   Low back/buttock pain  Neck/shoulder pain  Certain types of headaches  Preparation for the injection:  1. Do not eat any solid food or dairy products within 8 hours of your appointment. 2. You may drink clear liquid up to 3 hours before appointment.  Clear liquids include water, black coffee, juice or soda.  No milk or cream please. 3. You may take your regular medication, including pain medications, with a sip of water before your appointment.  Diabetics should hold regular insulin (if taken separately) and take 1/2 normal NPH dose the morning of the procedure.  Carry some sugar containing items with you to your appointment. 4. A driver must accompany you and be prepared to drive you home after your procedure. 5. Bring all your current medications with you. 6. An IV may be inserted and sedation may be given at the discretion of the physician. 7. A blood pressure cuff, EKG and other monitors will often be applied during the procedure.  Some patients may need to have extra oxygen administered for a short period. 8. You will be asked to provide medical information, including your allergies and medications, prior to the  procedure.  We must know immediately if you are taking blood thinners (like Coumadin/Warfarin) or if you are allergic to IV iodine contrast (dye).  We must know if you could possible be pregnant.  Possible side-effects:   Bleeding from needle site  Infection (rare, may require surgery)  Nerve injury (rare)  Numbness & tingling (temporary)  Difficulty urinating (rare, temporary)  Spinal headache (a headache worse with upright posture)  Light-headedness (temporary)  Pain at injection site (serveral days)  Decreased blood pressure (rare, temporary)  Weakness in arm/leg (temporary)  Pressure sensation in back/neck (temporary)   Call if you experience:   Fever/chills associated with headache or increased back/neck pain  Headache worsened by an upright position  New onset,  weakness or numbness of an extremity below the injection site  Hives or difficulty breathing (go to the emergency room)  Inflammation or drainage at the injection site(s)  Severe back/neck pain greater than usual  New symptoms which are concerning to you  Please note:  Although the local anesthetic injected can often make your back or neck feel good for several hours after the injection, the pain will likely return. It takes 3-7 days for steroids to work.  You may not notice any pain relief for at least one week.  If effective, we will often do a series of 2-3 injections spaced 3-6 weeks apart to maximally decrease your pain.  After the initial series, you may be a candidate for a more permanent nerve block of the facets.  If you have any questions, please call #[phone]) [facility]

## 2016-10-05 ENCOUNTER — Encounter: Payer: Self-pay | Admitting: Pain Medicine

## 2016-10-05 ENCOUNTER — Ambulatory Visit: Payer: Medicare Other | Attending: Pain Medicine | Admitting: Pain Medicine

## 2016-10-05 VITALS — BP 121/73 | HR 85 | Temp 98.1°F | Resp 16 | Ht 70.0 in | Wt 145.0 lb

## 2016-10-05 DIAGNOSIS — F17210 Nicotine dependence, cigarettes, uncomplicated: Secondary | ICD-10-CM | POA: Diagnosis not present

## 2016-10-05 DIAGNOSIS — Z79899 Other long term (current) drug therapy: Secondary | ICD-10-CM | POA: Diagnosis not present

## 2016-10-05 DIAGNOSIS — K219 Gastro-esophageal reflux disease without esophagitis: Secondary | ICD-10-CM | POA: Insufficient documentation

## 2016-10-05 DIAGNOSIS — I10 Essential (primary) hypertension: Secondary | ICD-10-CM | POA: Insufficient documentation

## 2016-10-05 DIAGNOSIS — M5442 Lumbago with sciatica, left side: Secondary | ICD-10-CM | POA: Diagnosis not present

## 2016-10-05 DIAGNOSIS — M4802 Spinal stenosis, cervical region: Secondary | ICD-10-CM | POA: Diagnosis not present

## 2016-10-05 DIAGNOSIS — K7030 Alcoholic cirrhosis of liver without ascites: Secondary | ICD-10-CM | POA: Diagnosis not present

## 2016-10-05 DIAGNOSIS — G894 Chronic pain syndrome: Secondary | ICD-10-CM | POA: Diagnosis not present

## 2016-10-05 DIAGNOSIS — M488X6 Other specified spondylopathies, lumbar region: Secondary | ICD-10-CM | POA: Insufficient documentation

## 2016-10-05 DIAGNOSIS — M961 Postlaminectomy syndrome, not elsewhere classified: Secondary | ICD-10-CM | POA: Diagnosis not present

## 2016-10-05 DIAGNOSIS — E1165 Type 2 diabetes mellitus with hyperglycemia: Secondary | ICD-10-CM | POA: Diagnosis not present

## 2016-10-05 DIAGNOSIS — M160 Bilateral primary osteoarthritis of hip: Secondary | ICD-10-CM | POA: Insufficient documentation

## 2016-10-05 DIAGNOSIS — B182 Chronic viral hepatitis C: Secondary | ICD-10-CM | POA: Insufficient documentation

## 2016-10-05 DIAGNOSIS — M542 Cervicalgia: Secondary | ICD-10-CM | POA: Insufficient documentation

## 2016-10-05 DIAGNOSIS — Z823 Family history of stroke: Secondary | ICD-10-CM | POA: Insufficient documentation

## 2016-10-05 DIAGNOSIS — G8929 Other chronic pain: Secondary | ICD-10-CM

## 2016-10-05 DIAGNOSIS — G4700 Insomnia, unspecified: Secondary | ICD-10-CM | POA: Insufficient documentation

## 2016-10-05 DIAGNOSIS — F419 Anxiety disorder, unspecified: Secondary | ICD-10-CM | POA: Insufficient documentation

## 2016-10-05 DIAGNOSIS — G2581 Restless legs syndrome: Secondary | ICD-10-CM | POA: Diagnosis not present

## 2016-10-05 DIAGNOSIS — N2889 Other specified disorders of kidney and ureter: Secondary | ICD-10-CM | POA: Insufficient documentation

## 2016-10-05 DIAGNOSIS — M4696 Unspecified inflammatory spondylopathy, lumbar region: Secondary | ICD-10-CM

## 2016-10-05 DIAGNOSIS — M797 Fibromyalgia: Secondary | ICD-10-CM | POA: Diagnosis not present

## 2016-10-05 DIAGNOSIS — D696 Thrombocytopenia, unspecified: Secondary | ICD-10-CM | POA: Insufficient documentation

## 2016-10-05 DIAGNOSIS — Z6820 Body mass index (BMI) 20.0-20.9, adult: Secondary | ICD-10-CM | POA: Insufficient documentation

## 2016-10-05 DIAGNOSIS — M47816 Spondylosis without myelopathy or radiculopathy, lumbar region: Secondary | ICD-10-CM

## 2016-10-05 DIAGNOSIS — M533 Sacrococcygeal disorders, not elsewhere classified: Secondary | ICD-10-CM

## 2016-10-05 DIAGNOSIS — Z981 Arthrodesis status: Secondary | ICD-10-CM | POA: Diagnosis not present

## 2016-10-05 DIAGNOSIS — R1031 Right lower quadrant pain: Secondary | ICD-10-CM | POA: Insufficient documentation

## 2016-10-05 DIAGNOSIS — Z8249 Family history of ischemic heart disease and other diseases of the circulatory system: Secondary | ICD-10-CM | POA: Insufficient documentation

## 2016-10-05 DIAGNOSIS — M4726 Other spondylosis with radiculopathy, lumbar region: Secondary | ICD-10-CM | POA: Diagnosis not present

## 2016-10-05 DIAGNOSIS — M5116 Intervertebral disc disorders with radiculopathy, lumbar region: Secondary | ICD-10-CM | POA: Insufficient documentation

## 2016-10-05 DIAGNOSIS — E440 Moderate protein-calorie malnutrition: Secondary | ICD-10-CM | POA: Insufficient documentation

## 2016-10-05 DIAGNOSIS — E1142 Type 2 diabetes mellitus with diabetic polyneuropathy: Secondary | ICD-10-CM | POA: Diagnosis not present

## 2016-10-05 DIAGNOSIS — F329 Major depressive disorder, single episode, unspecified: Secondary | ICD-10-CM | POA: Diagnosis not present

## 2016-10-05 DIAGNOSIS — D500 Iron deficiency anemia secondary to blood loss (chronic): Secondary | ICD-10-CM | POA: Insufficient documentation

## 2016-10-05 DIAGNOSIS — Z803 Family history of malignant neoplasm of breast: Secondary | ICD-10-CM | POA: Insufficient documentation

## 2016-10-05 DIAGNOSIS — Z79891 Long term (current) use of opiate analgesic: Secondary | ICD-10-CM | POA: Diagnosis not present

## 2016-10-05 DIAGNOSIS — D689 Coagulation defect, unspecified: Secondary | ICD-10-CM | POA: Insufficient documentation

## 2016-10-05 DIAGNOSIS — M545 Low back pain: Secondary | ICD-10-CM | POA: Insufficient documentation

## 2016-10-05 DIAGNOSIS — R1013 Epigastric pain: Secondary | ICD-10-CM | POA: Insufficient documentation

## 2016-10-05 DIAGNOSIS — F1010 Alcohol abuse, uncomplicated: Secondary | ICD-10-CM | POA: Insufficient documentation

## 2016-10-05 DIAGNOSIS — Z87442 Personal history of urinary calculi: Secondary | ICD-10-CM | POA: Insufficient documentation

## 2016-10-05 DIAGNOSIS — E559 Vitamin D deficiency, unspecified: Secondary | ICD-10-CM | POA: Insufficient documentation

## 2016-10-05 NOTE — Progress Notes (Signed)
Safety precautions to be maintained throughout the outpatient stay will include: orient to surroundings, keep bed in low position, maintain call bell within reach at all times, provide assistance with transfer out of bed and ambulation.  

## 2016-10-05 NOTE — Patient Instructions (Addendum)
____________________________________________________________________________________________  Preparing for Procedure with Sedation Instructions: . Oral Intake: Do not eat or drink anything for at least 8 hours prior to your procedure. . Transportation: Public transportation is not allowed. Bring an adult driver. The driver must be physically present in our waiting room before any procedure can be started. . Physical Assistance: Bring an adult physically capable of assisting you, in the event you need help. This adult should keep you company at home for at least 6 hours after the procedure. . Blood Pressure Medicine: Take your blood pressure medicine with a sip of water the morning of the procedure. . Blood thinners:  . Diabetics on insulin: Notify the staff so that you can be scheduled 1st case in the morning. If your diabetes requires high dose insulin, take only  of your normal insulin dose the morning of the procedure and notify the staff that you have done so. . Preventing infections: Shower with an antibacterial soap the morning of your procedure. . Build-up your immune system: Take 1000 mg of Vitamin C with every meal (3 times a day) the day prior to your procedure. . Antibiotics: Inform the staff if you have a condition or reason that requires you to take antibiotics before dental procedures. . Pregnancy: If you are pregnant, call and cancel the procedure. . Sickness: If you have a cold, fever, or any active infections, call and cancel the procedure. . Arrival: You must be in the facility at least 30 minutes prior to your scheduled procedure. . Children: Do not bring children with you. . Dress appropriately: Bring dark clothing that you would not mind if they get stained. . Valuables: Do not bring any jewelry or valuables. Procedure appointments are reserved for interventional treatments only. . No Prescription Refills. . No medication changes will be discussed during procedure  appointments. . No disability issues will be discussed. ____________________________________________________________________________________________  Radiofrequency Lesioning Radiofrequency lesioning is a procedure that is performed to relieve pain. The procedure is often used for back, neck, or arm pain. Radiofrequency lesioning involves the use of a machine that creates radio waves to make heat. During the procedure, the heat is applied to the nerve that carries the pain signal. The heat damages the nerve and interferes with the pain signal. Pain relief usually starts about 2 weeks after the procedure and lasts for 6 months to 1 year. Tell a health care provider about:  Any allergies you have.  All medicines you are taking, including vitamins, herbs, eye drops, creams, and over-the-counter medicines.  Any problems you or family members have had with anesthetic medicines.  Any blood disorders you have.  Any surgeries you have had.  Any medical conditions you have.  Whether you are pregnant or may be pregnant. What are the risks? Generally, this is a safe procedure. However, problems may occur, including:  Pain or soreness at the injection site.  Infection at the injection site.  Damage to nerves or blood vessels.  What happens before the procedure?  Ask your health care provider about: ? Changing or stopping your regular medicines. This is especially important if you are taking diabetes medicines or blood thinners. ? Taking medicines such as aspirin and ibuprofen. These medicines can thin your blood. Do not take these medicines before your procedure if your health care provider instructs you not to.  Follow instructions from your health care provider about eating or drinking restrictions.  Plan to have someone take you home after the procedure.  If you go home   right after the procedure, plan to have someone with you for 24 hours. What happens during the procedure?  You  will be given one or more of the following: ? A medicine to help you relax (sedative). ? A medicine to numb the area (local anesthetic).  You will be awake during the procedure. You will need to be able to talk with the health care provider during the procedure.  With the help of a type of X-ray (fluoroscopy), the health care provider will insert a radiofrequency needle into the area to be treated.  Next, a wire that carries the radio waves (electrode) will be put through the radiofrequency needle. An electrical pulse will be sent through the electrode to verify the correct nerve. You will feel a tingling sensation, and you may have muscle twitching.  Then, the tissue that is around the needle tip will be heated by an electric current that is passed using the radiofrequency machine. This will numb the nerves.  A bandage (dressing) will be put on the insertion area after the procedure is done. The procedure may vary among health care providers and hospitals. What happens after the procedure?  Your blood pressure, heart rate, breathing rate, and blood oxygen level will be monitored often until the medicines you were given have worn off.  Return to your normal activities as directed by your health care provider. This information is not intended to replace advice given to you by your health care provider. Make sure you discuss any questions you have with your health care provider. Document Released: 11/11/2010 Document Revised: 08/21/2015 Document Reviewed: 04/22/2014 Elsevier Interactive Patient Education  2018 [place]  What are the risk, side effects and possible complications? Generally speaking, most procedures are safe.  However, with any procedure there are risks, side effects, and the possibility of complications.  The risks and complications are dependent upon the sites that are lesioned, or the type of nerve block to be performed.  The closer the  procedure is to the spine, the more serious the risks are.  Great care is taken when placing the radio frequency needles, block needles or lesioning probes, but sometimes complications can occur. 1. Infection: Any time there is an injection through the skin, there is a risk of infection.  This is why sterile conditions are used for these blocks.  There are four possible types of infection. 1. Localized skin infection. 2. Central Nervous System Infection-This can be in the form of Meningitis, which can be deadly. 3. Epidural Infections-This can be in the form of an epidural abscess, which can cause pressure inside of the spine, causing compression of the spinal cord with subsequent paralysis. This would require an emergency surgery to decompress, and there are no guarantees that the patient would recover from the paralysis. 4. Discitis-This is an infection of the intervertebral discs.  It occurs in about 1% of discography procedures.  It is difficult to treat and it may lead to surgery.        2. Pain: the needles have to go through skin and soft tissues, will cause soreness.       3. Damage to internal structures:  The nerves to be lesioned may be near blood vessels or    other nerves which can be potentially damaged.       4. Bleeding: Bleeding is more common if the patient is taking blood thinners such as  aspirin, Coumadin, Ticiid, Plavix, etc., or if he/she have some  genetic predisposition  such as hemophilia. Bleeding into the spinal canal can cause compression of the spinal  cord with subsequent paralysis.  This would require an emergency surgery to  decompress and there are no guarantees that the patient would recover from the  paralysis.       5. Pneumothorax:  Puncturing of a lung is a possibility, every time a needle is introduced in  the area of the chest or upper back.  Pneumothorax refers to free air around the  collapsed lung(s), inside of the thoracic cavity (chest cavity).  Another two  possible  complications related to a similar event would include: Hemothorax and Chylothorax.   These are variations of the Pneumothorax, where instead of air around the collapsed  lung(s), you may have blood or chyle, respectively.       6. Spinal headaches: They may occur with any procedures in the area of the spine.       7. Persistent CSF (Cerebro-Spinal Fluid) leakage: This is a rare problem, but may occur  with prolonged intrathecal or epidural catheters either due to the formation of a fistulous  track or a dural tear.       8. Nerve damage: By working so close to the spinal cord, there is always a possibility of  nerve damage, which could be as serious as a permanent spinal cord injury with  paralysis.       9. Death:  Although rare, severe deadly allergic reactions known as "Anaphylactic  reaction" can occur to any of the medications used.      10. Worsening of the symptoms:  We can always make thing worse.  What are the chances of something like this happening? Chances of any of this occuring are extremely low.  By statistics, you have more of a chance of getting killed in a motor vehicle accident: while driving to the hospital than any of the above occurring .  Nevertheless, you should be aware that they are possibilities.  In general, it is similar to taking a shower.  Everybody knows that you can slip, hit your head and get killed.  Does that mean that you should not shower again?  Nevertheless always keep in mind that statistics do not mean anything if you happen to be on the wrong side of them.  Even if a procedure has a 1 (one) in a 1,000,000 (million) chance of going wrong, it you happen to be that one..Also, keep in mind that by statistics, you have more of a chance of having something go wrong when taking medications.  Who should not have this procedure? If you are on a blood thinning medication (e.g. Coumadin, Plavix, see list of "Blood Thinners"), or if you have an active infection  going on, you should not have the procedure.  If you are taking any blood thinners, please inform your physician.  How should I prepare for this procedure?  Do not eat or drink anything at least six hours prior to the procedure.  Bring a driver with you .  It cannot be a taxi.  Come accompanied by an adult that can drive you back, and that is strong enough to help you if your legs get weak or numb from the local anesthetic.  Take all of your medicines the morning of the procedure with just enough water to swallow them.  If you have diabetes, make sure that you are scheduled to have your procedure done first thing in the morning, whenever possible.  If you have diabetes, take only half of your insulin dose and notify our nurse that you have done so as soon as you arrive at the clinic.  If you are diabetic, but only take blood sugar pills (oral hypoglycemic), then do not take them on the morning of your procedure.  You may take them after you have had the procedure.  Do not take aspirin or any aspirin-containing medications, at least eleven (11) days prior to the procedure.  They may prolong bleeding.  Wear loose fitting clothing that may be easy to take off and that you would not mind if it got stained with Betadine or blood.  Do not wear any jewelry or perfume  Remove any nail coloring.  It will interfere with some of our monitoring equipment.  NOTE: Remember that this is not meant to be interpreted as a complete list of all possible complications.  Unforeseen problems may occur.  BLOOD THINNERS The following drugs contain aspirin or other products, which can cause increased bleeding during surgery and should not be taken for 2 weeks prior to and 1 week after surgery.  If you should need take something for relief of minor pain, you may take acetaminophen which is found in Tylenol,m Datril, Anacin-3 and Panadol. It is not blood thinner. The products listed below are.  Do not take any of  the products listed below in addition to any listed on your instruction sheet.  A.P.C or A.P.C with Codeine Codeine Phosphate Capsules #3 Ibuprofen Ridaura  ABC compound Congesprin Imuran rimadil  Advil Cope Indocin Robaxisal  Alka-Seltzer Effervescent Pain Reliever and Antacid Coricidin or Coricidin-D  Indomethacin Rufen  Alka-Seltzer plus Cold Medicine Cosprin Ketoprofen S-A-C Tablets  Anacin Analgesic Tablets or Capsules Coumadin Korlgesic Salflex  Anacin Extra Strength Analgesic tablets or capsules CP-2 Tablets Lanoril Salicylate  Anaprox Cuprimine Capsules Levenox Salocol  Anexsia-D Dalteparin Magan Salsalate  Anodynos Darvon compound Magnesium Salicylate Sine-off  Ansaid Dasin Capsules Magsal Sodium Salicylate  Anturane Depen Capsules Marnal Soma  APF Arthritis pain formula Dewitt's Pills Measurin Stanback  Argesic Dia-Gesic Meclofenamic Sulfinpyrazone  Arthritis Bayer Timed Release Aspirin Diclofenac Meclomen Sulindac  Arthritis pain formula Anacin Dicumarol Medipren Supac  Analgesic (Safety coated) Arthralgen Diffunasal Mefanamic Suprofen  Arthritis Strength Bufferin Dihydrocodeine Mepro Compound Suprol  Arthropan liquid Dopirydamole Methcarbomol with Aspirin Synalgos  ASA tablets/Enseals Disalcid Micrainin Tagament  Ascriptin Doan's Midol Talwin  Ascriptin A/D Dolene Mobidin Tanderil  Ascriptin Extra Strength Dolobid Moblgesic Ticlid  Ascriptin with Codeine Doloprin or Doloprin with Codeine Momentum Tolectin  Asperbuf Duoprin Mono-gesic Trendar  Aspergum Duradyne Motrin or Motrin IB Triminicin  Aspirin plain, buffered or enteric coated Durasal Myochrisine Trigesic  Aspirin Suppositories Easprin Nalfon Trillsate  Aspirin with Codeine Ecotrin Regular or Extra Strength Naprosyn Uracel  Atromid-S Efficin Naproxen Ursinus  Auranofin Capsules Elmiron Neocylate Vanquish  Axotal Emagrin Norgesic Verin  Azathioprine Empirin or Empirin with Codeine Normiflo Vitamin E  Azolid  Emprazil Nuprin Voltaren  Bayer Aspirin plain, buffered or children's or timed BC Tablets or powders Encaprin Orgaran Warfarin Sodium  Buff-a-Comp Enoxaparin Orudis Zorpin  Buff-a-Comp with Codeine Equegesic Os-Cal-Gesic   Buffaprin Excedrin plain, buffered or Extra Strength Oxalid   Bufferin Arthritis Strength Feldene Oxphenbutazone   Bufferin plain or Extra Strength Feldene Capsules Oxycodone with Aspirin   Bufferin with Codeine Fenoprofen Fenoprofen Pabalate or Pabalate-SF   Buffets II Flogesic Panagesic   Buffinol plain or Extra Strength Florinal or Florinal with Codeine Panwarfarin   Buf-Tabs Flurbiprofen Penicillamine   Butalbital  Compound Four-way cold tablets Penicillin   Butazolidin Fragmin Pepto-Bismol   Carbenicillin Geminisyn Percodan   Carna Arthritis Reliever Geopen Persantine   Carprofen Gold's salt Persistin   Chloramphenicol Goody's Phenylbutazone   Chloromycetin Haltrain Piroxlcam   Clmetidine heparin Plaquenil   Cllnoril Hyco-pap Ponstel   Clofibrate Hydroxy chloroquine Propoxyphen         Before stopping any of these medications, be sure to consult the physician who ordered them.  Some, such as Coumadin (Warfarin) are ordered to prevent or treat serious conditions such as "deep thrombosis", "pumonary embolisms", and other heart problems.  The amount of time that you may need off of the medication may also vary with the medication and the reason for which you were taking it.  If you are taking any of these medications, please make sure you notify your pain physician before you undergo any procedures.         Facet Blocks Patient Information  Description: The facets are joints in the spine between the vertebrae.  Like any joints in the body, facets can become irritated and painful.  Arthritis can also effect the facets.  By injecting steroids and local anesthetic in and around these joints, we can temporarily block the nerve supply to them.  Steroids act directly  on irritated nerves and tissues to reduce selling and inflammation which often leads to decreased pain.  Facet blocks may be done anywhere along the spine from the neck to the low back depending upon the location of your pain.   After numbing the skin with local anesthetic (like Novocaine), a small needle is passed onto the facet joints under x-ray guidance.  You may experience a sensation of pressure while this is being done.  The entire block usually lasts about 15-25 minutes.   Conditions which may be treated by facet blocks:   Low back/buttock pain  Neck/shoulder pain  Certain types of headaches  Preparation for the injection:  1. Do not eat any solid food or dairy products within 8 hours of your appointment. 2. You may drink clear liquid up to 3 hours before appointment.  Clear liquids include water, black coffee, juice or soda.  No milk or cream please. 3. You may take your regular medication, including pain medications, with a sip of water before your appointment.  Diabetics should hold regular insulin (if taken separately) and take 1/2 normal NPH dose the morning of the procedure.  Carry some sugar containing items with you to your appointment. 4. A driver must accompany you and be prepared to drive you home after your procedure. 5. Bring all your current medications with you. 6. An IV may be inserted and sedation may be given at the discretion of the physician. 7. A blood pressure cuff, EKG and other monitors will often be applied during the procedure.  Some patients may need to have extra oxygen administered for a short period. 8. You will be asked to provide medical information, including your allergies and medications, prior to the procedure.  We must know immediately if you are taking blood thinners (like Coumadin/Warfarin) or if you are allergic to IV iodine contrast (dye).  We must know if you could possible be pregnant.  Possible side-effects:   Bleeding from needle  site  Infection (rare, may require surgery)  Nerve injury (rare)  Numbness & tingling (temporary)  Difficulty urinating (rare, temporary)  Spinal headache (a headache worse with upright posture)  Light-headedness (temporary)  Pain at injection site (serveral days)  Decreased blood pressure (rare, temporary)  Weakness in arm/leg (temporary)  Pressure sensation in back/neck (temporary)   Call if you experience:   Fever/chills associated with headache or increased back/neck pain  Headache worsened by an upright position  New onset, weakness or numbness of an extremity below the injection site  Hives or difficulty breathing (go to the emergency room)  Inflammation or drainage at the injection site(s)  Severe back/neck pain greater than usual  New symptoms which are concerning to you  Please note:  Although the local anesthetic injected can often make your back or neck feel good for several hours after the injection, the pain will likely return. It takes 3-7 days for steroids to work.  You may not notice any pain relief for at least one week.  If effective, we will often do a series of 2-3 injections spaced 3-6 weeks apart to maximally decrease your pain.  After the initial series, you may be a candidate for a more permanent nerve block of the facets.  If you have any questions, please call #[phone]) [facility]

## 2016-10-14 ENCOUNTER — Other Ambulatory Visit: Payer: Self-pay | Admitting: Gastroenterology

## 2016-10-14 DIAGNOSIS — K7460 Unspecified cirrhosis of liver: Principal | ICD-10-CM

## 2016-10-14 DIAGNOSIS — B182 Chronic viral hepatitis C: Secondary | ICD-10-CM

## 2016-10-20 ENCOUNTER — Ambulatory Visit
Admission: RE | Admit: 2016-10-20 | Discharge: 2016-10-20 | Disposition: A | Discharge status: Home / Self Care | Payer: Medicare Other | Source: Ambulatory Visit | Attending: Gastroenterology | Admitting: Gastroenterology

## 2016-10-20 DIAGNOSIS — N289 Disorder of kidney and ureter, unspecified: Secondary | ICD-10-CM | POA: Insufficient documentation

## 2016-10-20 DIAGNOSIS — K7031 Alcoholic cirrhosis of liver with ascites: Secondary | ICD-10-CM | POA: Insufficient documentation

## 2016-10-20 DIAGNOSIS — K8020 Calculus of gallbladder without cholecystitis without obstruction: Secondary | ICD-10-CM | POA: Insufficient documentation

## 2016-10-20 DIAGNOSIS — B182 Chronic viral hepatitis C: Secondary | ICD-10-CM

## 2016-10-20 DIAGNOSIS — I700 Atherosclerosis of aorta: Secondary | ICD-10-CM | POA: Insufficient documentation

## 2016-10-20 DIAGNOSIS — K7460 Unspecified cirrhosis of liver: Secondary | ICD-10-CM

## 2016-10-20 MED ORDER — GADOBENATE DIMEGLUMINE 529 MG/ML IV SOLN
13.0000 mL | Freq: Once | INTRAVENOUS | Status: AC | PRN
  Administered 2016-10-20: 13 mL via INTRAVENOUS

## 2016-10-21 ENCOUNTER — Inpatient Hospital Stay: Payer: Medicare Other | Attending: Oncology | Admitting: Oncology

## 2016-10-21 ENCOUNTER — Inpatient Hospital Stay: Payer: Medicare Other

## 2016-10-21 ENCOUNTER — Other Ambulatory Visit: Payer: Self-pay | Admitting: *Deleted

## 2016-10-21 ENCOUNTER — Encounter: Payer: Self-pay | Admitting: Oncology

## 2016-10-21 VITALS — BP 105/69 | HR 91 | Temp 98.8°F | Wt 175.0 lb

## 2016-10-21 DIAGNOSIS — F329 Major depressive disorder, single episode, unspecified: Secondary | ICD-10-CM | POA: Insufficient documentation

## 2016-10-21 DIAGNOSIS — I700 Atherosclerosis of aorta: Secondary | ICD-10-CM | POA: Insufficient documentation

## 2016-10-21 DIAGNOSIS — K219 Gastro-esophageal reflux disease without esophagitis: Secondary | ICD-10-CM | POA: Insufficient documentation

## 2016-10-21 DIAGNOSIS — E559 Vitamin D deficiency, unspecified: Secondary | ICD-10-CM | POA: Diagnosis not present

## 2016-10-21 DIAGNOSIS — M5116 Intervertebral disc disorders with radiculopathy, lumbar region: Secondary | ICD-10-CM | POA: Insufficient documentation

## 2016-10-21 DIAGNOSIS — R911 Solitary pulmonary nodule: Secondary | ICD-10-CM | POA: Diagnosis not present

## 2016-10-21 DIAGNOSIS — K7460 Unspecified cirrhosis of liver: Secondary | ICD-10-CM | POA: Diagnosis not present

## 2016-10-21 DIAGNOSIS — F419 Anxiety disorder, unspecified: Secondary | ICD-10-CM | POA: Insufficient documentation

## 2016-10-21 DIAGNOSIS — G2581 Restless legs syndrome: Secondary | ICD-10-CM | POA: Insufficient documentation

## 2016-10-21 DIAGNOSIS — D500 Iron deficiency anemia secondary to blood loss (chronic): Secondary | ICD-10-CM

## 2016-10-21 DIAGNOSIS — M797 Fibromyalgia: Secondary | ICD-10-CM | POA: Diagnosis not present

## 2016-10-21 DIAGNOSIS — I10 Essential (primary) hypertension: Secondary | ICD-10-CM | POA: Insufficient documentation

## 2016-10-21 DIAGNOSIS — B182 Chronic viral hepatitis C: Secondary | ICD-10-CM | POA: Diagnosis not present

## 2016-10-21 DIAGNOSIS — E119 Type 2 diabetes mellitus without complications: Secondary | ICD-10-CM | POA: Insufficient documentation

## 2016-10-21 DIAGNOSIS — Z79899 Other long term (current) drug therapy: Secondary | ICD-10-CM | POA: Diagnosis not present

## 2016-10-21 DIAGNOSIS — K259 Gastric ulcer, unspecified as acute or chronic, without hemorrhage or perforation: Secondary | ICD-10-CM | POA: Diagnosis not present

## 2016-10-21 DIAGNOSIS — D509 Iron deficiency anemia, unspecified: Secondary | ICD-10-CM | POA: Diagnosis not present

## 2016-10-21 DIAGNOSIS — K8020 Calculus of gallbladder without cholecystitis without obstruction: Secondary | ICD-10-CM | POA: Diagnosis not present

## 2016-10-21 DIAGNOSIS — F17210 Nicotine dependence, cigarettes, uncomplicated: Secondary | ICD-10-CM | POA: Insufficient documentation

## 2016-10-21 LAB — CBC
HCT: 33.6 % — ABNORMAL LOW (ref 35.0–47.0)
Hemoglobin: 11.5 g/dL — ABNORMAL LOW (ref 12.0–16.0)
MCH: 33.8 pg (ref 26.0–34.0)
MCHC: 34.2 g/dL (ref 32.0–36.0)
MCV: 98.9 fL (ref 80.0–100.0)
Platelets: 147 10*3/uL — ABNORMAL LOW (ref 150–440)
RBC: 3.39 MIL/uL — ABNORMAL LOW (ref 3.80–5.20)
RDW: 14.1 % (ref 11.5–14.5)
WBC: 6.3 10*3/uL (ref 3.6–11.0)

## 2016-10-21 LAB — IRON AND TIBC
Iron: 59 ug/dL (ref 28–170)
Saturation Ratios: 12 % (ref 10.4–31.8)
TIBC: 511 ug/dL — ABNORMAL HIGH (ref 250–450)
UIBC: 452 ug/dL

## 2016-10-21 LAB — FERRITIN: Ferritin: 15 ng/mL (ref 11–307)

## 2016-10-21 NOTE — Progress Notes (Signed)
Hematology/Oncology Consult note [facility]  Telephone:([phone] [phone]  Patient Care Team: Jodi Marble, MD as PCP - General (Internal Medicine)   Name of the patient: Audrey Garrett  [id number]  June 14, 1960   Date of visit: 10/21/16  Diagnosis- 1. Iron deficiency anemia  Chief complaint/ Reason for visit- routine f/u  Heme/Onc history: Patient is a 56 year old female who was last seen by Dr. Alvy Bimler in jan 2018 for [place]. Patient has liver cirrhosis and is currently receiving Harvoni for hepatitis C. GI evaluation is as follows:  Upper EGD on 04/01/16 showed multiple dispersed diminutive erosions were found in the gastric antrum. There were no stigmata of recent bleeding. One healing cratered gastric ulcer with no stigmata of bleeding was found in the prepyloric region of the stomach. The lesion was 2 mm in largest dimension. Colonoscopy on 04/01/16 showed multiple small-mouthed diverticula were found in the sigmoid colon and descending colon. A single small localized angioectasia with typical arborization was found in the proximal ascending colon. Coagulation for tissue destruction using argon plasma at 0.5 liters/minute and 20 watts was successful.  She has received significant blood transfusions recently since March 2017. Her last blood transfusion was on 03/17/16. She had 2 units of blood She received 4 doses of IV Venofer  In Jan 2018 she received blood trasnsfusion followed by ferraheme.   During her visit in February 2018 her H&H was 10.3/ 31.6. IV iron was held off and she was supposed to get cbc checked in1 month. Patient missed that appointment and became progressively iron deficient again when her hb dropped to 7.9. She received 1 unit of blood transfusion  Patient continued to feel fatigued despite improvement in her anemia. Given her strong h/o smoking CT lung cancer screenign was obtained which showed: IMPRESSION: 11.6 mm  spiculated right lung nodule just anterior to the major fissure with associated fissure all retraction. Lung-RADS Category 4A, suspicious. Follow up low-dose chest CT without contrast in 3 months (please use the following order, "CT CHEST LCS NODULE FOLLOW-UP W/O CM") is recommended. Alternatively, PET may be considered when there is a solid component 19mm or larger.  Other bilateral pulmonary nodules evident. Attention on follow-up.   PET/CT showed mild hypermetabolism in RUL nodule of 1.8  Interval history- reports feeling fatigue. Had her ascites tapped about a month ago  ECOG PS- 2 Pain scale- 5- back pain Opioid associated constipation- no  Review of systems- Review of Systems  Constitutional: Positive for malaise/fatigue. Negative for chills, fever and weight loss.  HENT: Negative for congestion, ear discharge and nosebleeds.   Eyes: Negative for blurred vision.  Respiratory: Negative for cough, hemoptysis, sputum production, shortness of breath and wheezing.   Cardiovascular: Positive for leg swelling. Negative for chest pain, palpitations, orthopnea and claudication.  Gastrointestinal: Negative for abdominal pain, blood in stool, constipation, diarrhea, heartburn, melena, nausea and vomiting.  Genitourinary: Negative for dysuria, flank pain, frequency, hematuria and urgency.  Musculoskeletal: Positive for back pain. Negative for joint pain and myalgias.  Skin: Negative for rash.  Neurological: Negative for dizziness, tingling, focal weakness, seizures, weakness and headaches.  Endo/Heme/Allergies: Does not bruise/bleed easily.  Psychiatric/Behavioral: Negative for depression and suicidal ideas. The patient does not have insomnia.      No Known Allergies   Past Medical History:  Diagnosis Date  . Alcohol abuse   . Alcoholic cirrhosis of liver without ascites ([place])   . Anemia   . Anxiety   .  Arthropathy   . Back pain   . Bronchitis   . BRONCHITIS, ACUTE 11/01/2009     Qualifier: Diagnosis of  By: Alveta Heimlich MD, Cornelia Copa    . Chronic hepatitis C ([place])   . Chronic LBP 12/25/2014   Overview:  Post extensive surgery   . Cirrhosis ([place])   . DDD (degenerative disc disease), lumbar 01/01/2015  . Depression   . Diabetes mellitus without complication ([place])   . Diverticulosis   . Edema   . Fatigue   . Fibromyalgia   . GERD (gastroesophageal reflux disease)    gastroparesis  . High risk medications (not anticoagulants) long-term use   . Hyperglycemia   . Hypertension   . Kidney mass    11/17 small mass-  . Left leg pain 01/01/2015  . Lumbago   . Lumbar radicular pain 01/01/2015  . Muscle spasm   . Neck pain 01/01/2015  . Polyarthritis   . Reflux   . Restless leg syndrome    01/2016  . RLS (restless legs syndrome)   . Sacroiliac pain 01/01/2015  . Shoulder pain   . Sinusitis   . SOB (shortness of breath)   . Spine disorder   . Stress headaches   . Upper back pain 01/01/2015  . Urinary frequency   . Vitamin D deficiency disease      Past Surgical History:  Procedure Laterality Date  . BACK SURGERY  12/19/2011  . BACK SURGERY    . COLONOSCOPY WITH PROPOFOL N/A 09/25/2015   Procedure: COLONOSCOPY WITH PROPOFOL;  Surgeon: Lollie Sails, MD;  Location[facility] ENDOSCOPY;  Service: Endoscopy;  Laterality: N/A;  . COLONOSCOPY WITH PROPOFOL N/A 04/01/2016   Procedure: COLONOSCOPY WITH PROPOFOL;  Surgeon: Lollie Sails, MD;  Location: [facility] ENDOSCOPY;  Service: Endoscopy;  Laterality: N/A;  . ESOPHAGOGASTRODUODENOSCOPY N/A 04/14/2015   Procedure: ESOPHAGOGASTRODUODENOSCOPY (EGD);  Surgeon: Lollie Sails, MD;  Location: [facility] ENDOSCOPY;  Service: Endoscopy;  Laterality: N/A;  . ESOPHAGOGASTRODUODENOSCOPY (EGD) WITH PROPOFOL N/A 09/16/2015   Procedure: ESOPHAGOGASTRODUODENOSCOPY (EGD) WITH PROPOFOL;  Surgeon: Lollie Sails, MD;  Location[facility] ENDOSCOPY;  Service: Endoscopy;  Laterality: N/A;  . ESOPHAGOGASTRODUODENOSCOPY (EGD) WITH PROPOFOL N/A 01/22/2016    Procedure: ESOPHAGOGASTRODUODENOSCOPY (EGD) WITH PROPOFOL;  Surgeon: [name], MD;  Location: [place];  Service: Endoscopy;  Laterality: N/A;  . ESOPHAGOGASTRODUODENOSCOPY (EGD) WITH PROPOFOL N/A 04/01/2016   Procedure: ESOPHAGOGASTRODUODENOSCOPY (EGD) WITH PROPOFOL;  Surgeon: Lollie Sails, MD;  Location: [facility] ENDOSCOPY;  Service: Endoscopy;  Laterality: N/A;  . HERNIA REPAIR N/A 10/2015   abdominal   . TONSILLECTOMY    . TUBAL LIGATION    . UMBILICAL HERNIA REPAIR N/A 11/06/2015   Procedure: HERNIA REPAIR UMBILICAL ADULT;  Surgeon: [name], MD;  Location: [facility] ORS;  Service: General;  Laterality: N/A;    Social History   Social History  . Marital status: Married    Spouse name: N/A  . Number of children: N/A  . Years of education: N/A   Occupational History  . Not on file.   Social History Main Topics  . Smoking status: Current Every Day Smoker    Packs/day: 1.00    Years: 42.00    Types: Cigarettes  . Smokeless tobacco: Never Used     Comment: 2ppd until last 2 years  . Alcohol use No     Comment: stopped drinking 05/12/15  . Drug use: No  . Sexual activity: Not on file   Other Topics Concern  . Not  on file   Social History Narrative   Lives at home with husband    Family History  Problem Relation Age of Onset  . Stroke Mother   . Heart disease Mother   . [name] Father   . Breast cancer Sister 42  . Kidney disease Neg Hx   . Bladder Cancer Neg Hx   . Kidney cancer Neg Hx   . Prostate cancer Neg Hx      Current Outpatient Prescriptions:  .  busPIRone (BUSPAR) 15 MG tablet, Take 15 mg by mouth 2 (two) times daily., Disp: , Rfl:  .  calcium carbonate (TUMS - DOSED IN MG ELEMENTAL CALCIUM) 500 MG chewable tablet, Chew 2 tablets by mouth 2 (two) times daily., Disp: , Rfl:  .  cyclobenzaprine (FLEXERIL) 5 MG tablet, Take 1 tablet (5 mg total) by mouth 3 (three) times daily as needed for muscle spasms., Disp: 90 tablet, Rfl: 0 .   furosemide (LASIX) 40 MG tablet, Take by mouth 2 (two) times daily. , Disp: , Rfl:  .  lactulose (CHRONULAC) 10 GM/15ML solution, Take 10 g by mouth 3 (three) times daily. , Disp: , Rfl:  .  Magnesium Oxide 250 MG TABS, Take 1 tablet by mouth daily., Disp: , Rfl:  .  Multiple Vitamin (MULTIVITAMIN WITH MINERALS) TABS tablet, Take 1 tablet by mouth daily., Disp: , Rfl:  .  Oxycodone HCl 20 MG TABS, Take 1 tablet (20 mg total) by mouth every 6 (six) hours as needed., Disp: 120 tablet, Rfl: 0 .  [START ON 10/24/2016] Oxycodone HCl 20 MG TABS, Take 1 tablet (20 mg total) by mouth every 6 (six) hours as needed., Disp: 120 tablet, Rfl: 0 .  Oxycodone HCl 20 MG TABS, Take 1 tablet (20 mg total) by mouth every 6 (six) hours., Disp: 120 tablet, Rfl: 0 .  pantoprazole (PROTONIX) 40 MG tablet, TAKE 1 TABLET (40 MG TOTAL) BY MOUTH 2 (TWO) TIMES DAILY., Disp: , Rfl: 11 .  PARoxetine (PAXIL) 40 MG tablet, Take 40 mg by mouth every morning., Disp: , Rfl: 2 .  pregabalin (LYRICA) 75 MG capsule, Take 75 mg by mouth 2 (two) times daily., Disp: , Rfl:  .  spironolactone (ALDACTONE) 50 MG tablet, TAKE 2 TABLETS BY MOUTH EVERY DAY, Disp: , Rfl:  .  sucralfate (CARAFATE) 1 GM/10ML suspension, Take 1 g by mouth 3 (three) times daily as needed. , Disp: , Rfl:  .  thiamine (VITAMIN B-1) 100 MG tablet, Take 1 tablet (100 mg total) by mouth daily., Disp: 90 tablet, Rfl: 6 .  traZODone (DESYREL) 50 MG tablet, Take 50 mg by mouth at bedtime., Disp: , Rfl:   Physical exam:  Vitals:   10/21/16 1059  BP: 105/69  Pulse: 91  Temp: 98.8 F (37.1 C)  TempSrc: Tympanic  Weight: 175 lb (79.4 kg)   Physical Exam  Constitutional: She is oriented to person, place, and time and well-developed, well-nourished, and in no distress.  HENT:  Head: Normocephalic and atraumatic.  Eyes: Pupils are equal, round, and reactive to light. EOM are normal.  Neck: Normal range of motion.  Cardiovascular: Normal rate, regular rhythm and normal  heart sounds.   Pulmonary/Chest: Effort normal and breath sounds normal.  Abdominal: Soft. Bowel sounds are normal.  distended  Musculoskeletal: She exhibits edema.  Neurological: She is alert and oriented to person, place, and time.  Skin: Skin is warm and dry.     CMP Latest Ref Rng & Units 04/12/2016  Glucose 65 - 99 mg/dL 101(H)  BUN 6 - 20 mg/dL 7  Creatinine 0.44 - 1.00 mg/dL 0.69  Sodium 135 - 145 mmol/L 130(L)  Potassium 3.5 - 5.1 mmol/L 3.4(L)  Chloride 101 - 111 mmol/L 97(L)  CO2 22 - 32 mmol/L 26  Calcium 8.9 - 10.3 mg/dL 9.3  Total Protein 6.5 - 8.1 g/dL 7.3  Total Bilirubin 0.3 - 1.2 mg/dL 0.7  Alkaline Phos 38 - 126 U/L 59  AST 15 - 41 U/L 20  ALT 14 - 54 U/L 9(L)   CBC Latest Ref Rng & Units 10/21/2016  WBC 3.6 - 11.0 K/uL 6.3  Hemoglobin 12.0 - 16.0 g/dL 11.5(L)  Hematocrit 35.0 - 47.0 % 33.6(L)  Platelets 150 - 440 K/uL 147(L)    No images are attached to the encounter.  Mr Abdomen Wwo Contrast  Result Date: 10/20/2016 CLINICAL DATA:  History of hepatitis-C cirrhosis. EXAM: MRI ABDOMEN WITHOUT AND WITH CONTRAST TECHNIQUE: Multiplanar multisequence MR imaging of the abdomen was performed both before and after the administration of intravenous contrast. CONTRAST:  36mL MULTIHANCE GADOBENATE DIMEGLUMINE 529 MG/ML IV SOLN COMPARISON:  08/11/2016 FINDINGS: Lower chest: No acute findings. Hepatobiliary: The liver appears cirrhotic. No arterial phase enhancing lesions identified. No abnormal areas of washout noted on the delayed sequences. Small stones within the gallbladder are noted measuring up to 5 mm. No biliary dilatation and no evidence for choledocholithiasis. Pancreas: No mass, inflammatory changes, or other parenchymal abnormality identified. Spleen: The spleen measures 10 cm in length. No focal splenic abnormality identified. Adrenals/Urinary Tract: The adrenal glands appear normal. Unremarkable appearance of the left kidney. Solid enhancing lesion arising from  the upper pole of the right kidney is identified measuring 1.9 cm. This is similar to previous study and remains worrisome for renal cell carcinoma. Stomach/Bowel: Visualized portions within the abdomen are unremarkable. Vascular/Lymphatic: Aortic atherosclerosis. No upper abdominal aneurysm. Prominent upper abdominal lymph nodes without adenopathy. Other: There is a small volume of ascites identified within the upper abdomen. Musculoskeletal: No suspicious bone lesions identified. IMPRESSION: 1. Morphologic features of liver compatible with cirrhosis. 2. No suspicious enhancing lesions identified to suggest hepatoma. 3. Similar appearance of solid enhancing lesion arising from upper pole of the right kidney which is worrisome for a renal cell carcinoma. 4.  Aortic Atherosclerosis (ICD10-I70.0). 5. Gallstones. Electronically Signed   By: Kerby Moors M.D.   On: 10/20/2016 14:28     Assessment and plan- Patient is a 56 y.o. female who sees me for following issues:  1. Iron deficiency anemia- iron studies from today consistent with iron deficiency. Will plan 2 doses of feraheme. She continues to f/u with GI  2. Chronic liver disease from cirrhosis secondary to Hep C- sees GI as above  3. Lung nodule- she did not desires surgery after meeting with Dr. Genevive Bi. Repeat CT planned next month. She is seeing Dr. Mortimer Fries tomorrow. I will see her after her CT scan   Visit Diagnosis 1. Iron deficiency anemia, unspecified iron deficiency anemia type   2. Lung nodule      Dr. Randa Evens, MD, MPH [facility] Pager- [phone] 10/21/2016 12:26 PM

## 2016-10-22 ENCOUNTER — Institutional Professional Consult (permissible substitution): Payer: 59 | Admitting: Internal Medicine

## 2016-10-29 ENCOUNTER — Inpatient Hospital Stay: Payer: Medicare Other | Attending: Oncology

## 2016-10-29 VITALS — BP 106/68 | HR 80 | Temp 98.0°F | Resp 18

## 2016-10-29 DIAGNOSIS — K259 Gastric ulcer, unspecified as acute or chronic, without hemorrhage or perforation: Secondary | ICD-10-CM | POA: Diagnosis not present

## 2016-10-29 DIAGNOSIS — E559 Vitamin D deficiency, unspecified: Secondary | ICD-10-CM | POA: Diagnosis not present

## 2016-10-29 DIAGNOSIS — F17210 Nicotine dependence, cigarettes, uncomplicated: Secondary | ICD-10-CM | POA: Insufficient documentation

## 2016-10-29 DIAGNOSIS — G2581 Restless legs syndrome: Secondary | ICD-10-CM | POA: Diagnosis not present

## 2016-10-29 DIAGNOSIS — D509 Iron deficiency anemia, unspecified: Secondary | ICD-10-CM | POA: Diagnosis present

## 2016-10-29 DIAGNOSIS — K219 Gastro-esophageal reflux disease without esophagitis: Secondary | ICD-10-CM | POA: Insufficient documentation

## 2016-10-29 DIAGNOSIS — R911 Solitary pulmonary nodule: Secondary | ICD-10-CM | POA: Diagnosis not present

## 2016-10-29 DIAGNOSIS — K8020 Calculus of gallbladder without cholecystitis without obstruction: Secondary | ICD-10-CM | POA: Insufficient documentation

## 2016-10-29 DIAGNOSIS — Z79899 Other long term (current) drug therapy: Secondary | ICD-10-CM | POA: Diagnosis not present

## 2016-10-29 DIAGNOSIS — M5116 Intervertebral disc disorders with radiculopathy, lumbar region: Secondary | ICD-10-CM | POA: Diagnosis not present

## 2016-10-29 DIAGNOSIS — K7460 Unspecified cirrhosis of liver: Secondary | ICD-10-CM | POA: Insufficient documentation

## 2016-10-29 DIAGNOSIS — E119 Type 2 diabetes mellitus without complications: Secondary | ICD-10-CM | POA: Insufficient documentation

## 2016-10-29 DIAGNOSIS — I10 Essential (primary) hypertension: Secondary | ICD-10-CM | POA: Diagnosis not present

## 2016-10-29 DIAGNOSIS — F419 Anxiety disorder, unspecified: Secondary | ICD-10-CM | POA: Diagnosis not present

## 2016-10-29 DIAGNOSIS — M797 Fibromyalgia: Secondary | ICD-10-CM | POA: Insufficient documentation

## 2016-10-29 DIAGNOSIS — I700 Atherosclerosis of aorta: Secondary | ICD-10-CM | POA: Diagnosis not present

## 2016-10-29 DIAGNOSIS — B182 Chronic viral hepatitis C: Secondary | ICD-10-CM | POA: Insufficient documentation

## 2016-10-29 DIAGNOSIS — F329 Major depressive disorder, single episode, unspecified: Secondary | ICD-10-CM | POA: Diagnosis not present

## 2016-10-29 DIAGNOSIS — D500 Iron deficiency anemia secondary to blood loss (chronic): Secondary | ICD-10-CM

## 2016-10-29 MED ORDER — SODIUM CHLORIDE 0.9 % IV SOLN
510.0000 mg | Freq: Once | INTRAVENOUS | Status: AC
  Administered 2016-10-29: 510 mg via INTRAVENOUS
  Filled 2016-10-29: qty 17

## 2016-10-29 MED ORDER — SODIUM CHLORIDE 0.9 % IV SOLN
Freq: Once | INTRAVENOUS | Status: AC
  Administered 2016-10-29: 15:00:00 via INTRAVENOUS
  Filled 2016-10-29: qty 1000

## 2016-11-01 ENCOUNTER — Ambulatory Visit
Admission: RE | Admit: 2016-11-01 | Discharge: 2016-11-01 | Disposition: A | Discharge status: Home / Self Care | Payer: Medicare Other | Source: Ambulatory Visit | Attending: Oncology | Admitting: Oncology

## 2016-11-01 ENCOUNTER — Other Ambulatory Visit: Payer: Self-pay | Admitting: Oncology

## 2016-11-01 DIAGNOSIS — R918 Other nonspecific abnormal finding of lung field: Secondary | ICD-10-CM

## 2016-11-01 DIAGNOSIS — R188 Other ascites: Secondary | ICD-10-CM | POA: Insufficient documentation

## 2016-11-01 DIAGNOSIS — I700 Atherosclerosis of aorta: Secondary | ICD-10-CM | POA: Diagnosis not present

## 2016-11-01 DIAGNOSIS — K7460 Unspecified cirrhosis of liver: Secondary | ICD-10-CM | POA: Insufficient documentation

## 2016-11-01 DIAGNOSIS — J432 Centrilobular emphysema: Secondary | ICD-10-CM | POA: Diagnosis not present

## 2016-11-02 ENCOUNTER — Ambulatory Visit: Payer: 59 | Admitting: Oncology

## 2016-11-04 ENCOUNTER — Telehealth: Payer: Self-pay

## 2016-11-04 NOTE — Telephone Encounter (Signed)
Please schedule next available provider.

## 2016-11-04 NOTE — Telephone Encounter (Signed)
Cancer center [facility] calling stating patient is currently scheduled for Dr Ashby Dawes on 11/22/16  They need patient to be seen sooner  Please advise.

## 2016-11-05 ENCOUNTER — Inpatient Hospital Stay: Payer: Medicare Other

## 2016-11-05 DIAGNOSIS — D509 Iron deficiency anemia, unspecified: Secondary | ICD-10-CM | POA: Diagnosis not present

## 2016-11-05 DIAGNOSIS — D500 Iron deficiency anemia secondary to blood loss (chronic): Secondary | ICD-10-CM

## 2016-11-05 MED ORDER — SODIUM CHLORIDE 0.9 % IV SOLN
Freq: Once | INTRAVENOUS | Status: AC
Start: 2016-11-05 — End: 2016-11-05
  Administered 2016-11-05: 14:00:00 via INTRAVENOUS
  Filled 2016-11-05: qty 1000

## 2016-11-05 MED ORDER — SODIUM CHLORIDE 0.9 % IV SOLN
510.0000 mg | Freq: Once | INTRAVENOUS | Status: AC
  Administered 2016-11-05: 510 mg via INTRAVENOUS
  Filled 2016-11-05: qty 17

## 2016-11-05 NOTE — Telephone Encounter (Signed)
Called patient she is okay with going to [place]  She is going to [place] on 11/11/16 to see Dr Lenna Gilford    Nothing else needed.

## 2016-11-05 NOTE — Telephone Encounter (Signed)
Noted  

## 2016-11-08 NOTE — Telephone Encounter (Signed)
Spoke with pt and gave her an appt for [employer] on 11/11/16 @ 430pm with DS. Nothing further needed.

## 2016-11-11 ENCOUNTER — Ambulatory Visit (INDEPENDENT_AMBULATORY_CARE_PROVIDER_SITE_OTHER): Payer: Self-pay | Admitting: Pulmonary Disease

## 2016-11-11 ENCOUNTER — Encounter: Payer: Self-pay | Admitting: Pulmonary Disease

## 2016-11-11 ENCOUNTER — Institutional Professional Consult (permissible substitution): Payer: Medicare Other | Admitting: Pulmonary Disease

## 2016-11-11 VITALS — BP 122/80 | HR 96 | Ht 70.0 in | Wt 176.0 lb

## 2016-11-11 DIAGNOSIS — R911 Solitary pulmonary nodule: Secondary | ICD-10-CM

## 2016-11-11 DIAGNOSIS — K7460 Unspecified cirrhosis of liver: Secondary | ICD-10-CM

## 2016-11-11 DIAGNOSIS — F17200 Nicotine dependence, unspecified, uncomplicated: Secondary | ICD-10-CM

## 2016-11-11 NOTE — Patient Instructions (Signed)
A navigation bronchoscopy to be performed by Dr. Nadara Eaton will need to get blood work performed the day prior to the procedure - CBC, prothrombin time  Follow-up will be arranged as needed

## 2016-11-12 ENCOUNTER — Telehealth: Payer: Self-pay | Admitting: Pulmonary Disease

## 2016-11-12 NOTE — Telephone Encounter (Signed)
Pt is calling regarding scheduling her lung biopsy. Please call with status.

## 2016-11-12 NOTE — Telephone Encounter (Signed)
Pt informed of dates and times of pre assessment and procedure. Nothing further needed.

## 2016-11-14 ENCOUNTER — Encounter: Payer: Self-pay | Admitting: Pulmonary Disease

## 2016-11-14 NOTE — Progress Notes (Signed)
PULMONARY CONSULT NOTE  Requesting MD/Service: Janese Banks Date of initial consultation: 11/11/16 Reason for consultation: Lung nodule  PT PROFILE: 57 y.o. female smoker with history of cirrhosis (alcoholic, HCV) and new finding of RUL lung nodule on LDCT and subsequent studies noted below  DATA: 08/03/16 LDCT chest: 11.6 mm spiculated right lung nodule just anterior to the major fissure with associated fissure all retraction. Lung-RADS Category 4A, suspicious. Follow up low-dose chest CT without contrast in 3 months recommended. Alternatively, PET may be considered when there is a solid component 70mm or larger. Other bilateral pulmonary nodules evident. Attention on follow-up. 08/10/16 PFTs: Spirometry suggests mild obstruction but FEV1 is preserved at 3.18 L (105% predicted). FEV1/FVC 65%. Lung volumes normal. Diffusion capacity mildly reduced at 71% predicted. 08/11/16 PET scan: The right upper lobe pulmonary nodule is only mildly metabolic, with an SUV of 1.8. Morphologically this lesion has a concerning appearance and I am still suspicious for the possibility of low-grade adenocarcinoma. 2. Sub solid nodules in both upper lobes and the smaller 7 by 6 mm solid nodule in the left upper lobe are not metabolic but require surveillance for low-grade malignancy. The patient has a known 1.7 cm enhancing mass of the right kidney upper pole. The appearance is highly suspicious for renal cell carcinoma. No current findings of hypermetabolic metastatic adenopathy. CT chest 11/01/16: RUL nodule minimally larger and worrisome for bronchogenic carcinoma. Additional scattered pulmonary nodules stable since 08/03/16.  HPI:  As above, patient is referred for consideration of bronchoscopic biopsy of right upper lobe nodule. She initially underwent lung cancer screening with LD CT in May of this year. Findings are noted above. Further evaluation has included PET scan and repeat CT chest. Again, findings are noted above.  She has a history of alcoholic/HCV cirrhosis. She no longer drinks. She has been treated for HCV. She continues to smoke 1 pack of cigarettes per day. She denies significant respiratory or pulmonary complaints. She has minimal cough, nonproductive. She denies chest pain, hemoptysis. She has chronic ankle and pedal edema. She denies calf tenderness. She bruises easily. However, she has no documented coagulopathy. She denies unexplained weight loss. She has no sweats or fever.  She has seen Dr. Genevive Bi for consideration of resection and it is his recommendation that she undergo bronchoscopic biopsy first  Past Medical History:  Diagnosis Date  . Alcohol abuse   . Alcoholic cirrhosis of liver without ascites ([place])   . Anemia   . Anxiety   . Arthropathy   . Back pain   . Bronchitis   . BRONCHITIS, ACUTE 11/01/2009   Qualifier: Diagnosis of  By: Alveta Heimlich MD, Cornelia Copa    . Chronic hepatitis C ([place])   . Chronic LBP 12/25/2014   Overview:  Post extensive surgery   . Cirrhosis ([place])   . DDD (degenerative disc disease), lumbar 01/01/2015  . Depression   . Diabetes mellitus without complication ([place])   . Diverticulosis   . Edema   . Fatigue   . Fibromyalgia   . GERD (gastroesophageal reflux disease)    gastroparesis  . High risk medications (not anticoagulants) long-term use   . Hyperglycemia   . Hypertension   . Kidney mass    11/17 small mass-  . Left leg pain 01/01/2015  . Lumbago   . Lumbar radicular pain 01/01/2015  . Muscle spasm   . Neck pain 01/01/2015  . Polyarthritis   . Reflux   . Restless leg syndrome    01/2016  . RLS (  restless legs syndrome)   . Sacroiliac pain 01/01/2015  . Shoulder pain   . Sinusitis   . SOB (shortness of breath)   . Spine disorder   . Stress headaches   . Upper back pain 01/01/2015  . Urinary frequency   . Vitamin D deficiency disease     Past Surgical History:  Procedure Laterality Date  . BACK SURGERY  12/19/2011  . BACK SURGERY    . COLONOSCOPY WITH  PROPOFOL N/A 09/25/2015   Procedure: COLONOSCOPY WITH PROPOFOL;  Surgeon: Lollie Sails, MD;  Location: [facility] ENDOSCOPY;  Service: Endoscopy;  Laterality: N/A;  . COLONOSCOPY WITH PROPOFOL N/A 04/01/2016   Procedure: COLONOSCOPY WITH PROPOFOL;  Surgeon: Lollie Sails, MD;  Location: [facility] ENDOSCOPY;  Service: Endoscopy;  Laterality: N/A;  . ESOPHAGOGASTRODUODENOSCOPY N/A 04/14/2015   Procedure: ESOPHAGOGASTRODUODENOSCOPY (EGD);  Surgeon: Lollie Sails, MD;  Location: [facility] ENDOSCOPY;  Service: Endoscopy;  Laterality: N/A;  . ESOPHAGOGASTRODUODENOSCOPY (EGD) WITH PROPOFOL N/A 09/16/2015   Procedure: ESOPHAGOGASTRODUODENOSCOPY (EGD) WITH PROPOFOL;  Surgeon: Lollie Sails, MD;  Location: [facility] ENDOSCOPY;  Service: Endoscopy;  Laterality: N/A;  . ESOPHAGOGASTRODUODENOSCOPY (EGD) WITH PROPOFOL N/A 01/22/2016   Procedure: ESOPHAGOGASTRODUODENOSCOPY (EGD) WITH PROPOFOL;  Surgeon: [name], MD;  Location: [place];  Service: Endoscopy;  Laterality: N/A;  . ESOPHAGOGASTRODUODENOSCOPY (EGD) WITH PROPOFOL N/A 04/01/2016   Procedure: ESOPHAGOGASTRODUODENOSCOPY (EGD) WITH PROPOFOL;  Surgeon: Lollie Sails, MD;  Location: [facility] ENDOSCOPY;  Service: Endoscopy;  Laterality: N/A;  . HERNIA REPAIR N/A 10/2015   abdominal   . TONSILLECTOMY    . TUBAL LIGATION    . UMBILICAL HERNIA REPAIR N/A 11/06/2015   Procedure: HERNIA REPAIR UMBILICAL ADULT;  Surgeon: [name], MD;  Location: [facility] ORS;  Service: General;  Laterality: N/A;    MEDICATIONS: I have reviewed all medications and confirmed regimen as documented  Social History   Social History  . Marital status: Married    Spouse name: N/A  . Number of children: N/A  . Years of education: N/A   Occupational History  . Not on file.   Social History Main Topics  . Smoking status: Current Every Day Smoker    Packs/day: 1.00    Years: 42.00    Types: Cigarettes  . Smokeless tobacco: Never Used     Comment: 2ppd until last 2  years  . Alcohol use No     Comment: stopped drinking 05/12/15  . Drug use: No  . Sexual activity: Not on file   Other Topics Concern  . Not on file   Social History Narrative   Lives at home with husband    Family History  Problem Relation Age of Onset  . Stroke Mother   . Heart disease Mother   . [name] Father   . Breast cancer Sister 52  . Kidney disease Neg Hx   . Bladder Cancer Neg Hx   . Kidney cancer Neg Hx   . Prostate cancer Neg Hx     ROS: No fever, myalgias/arthralgias, unexplained weight loss or weight gain No new focal weakness or sensory deficits No otalgia, hearing loss, visual changes, nasal and sinus symptoms, mouth and throat problems No neck pain or adenopathy No abdominal pain, N/V/D, diarrhea, change in bowel pattern No dysuria, change in urinary pattern   Vitals:   11/11/16 1619 11/11/16 1624  BP:  122/80  Pulse:  96  SpO2:  91%  Weight: 176 lb (79.8 kg)   Height: 5\' 10"  (1.778 m)  EXAM:  Gen: Appears somewhat older than stated age, No overt respiratory distress HEENT: NCAT, sclera white, oropharynx normal Neck: Supple without LAN, thyromegaly, JVD Lungs: breath sounds full with normal percussion note and no adventitious sounds Cardiovascular: RRR, no murmurs noted Abdomen: Soft, nontender, normal BS Ext: Multiple ecchymoses on all extremities, clubbing or cyanosis. Symmetric ankle and pedal edema Neuro: CNs grossly intact, motor and sensory intact  DATA:   BMP Latest Ref Rng & Units 04/12/2016 01/22/2016 01/21/2016  Glucose 65 - 99 mg/dL 101(H) 106(H) 113(H)  BUN 6 - 20 mg/dL 7 8 14   Creatinine 0.44 - 1.00 mg/dL 0.69 0.54 0.66  Sodium 135 - 145 mmol/L 130(L) 135 131(L)  Potassium 3.5 - 5.1 mmol/L 3.4(L) 4.2 4.1  Chloride 101 - 111 mmol/L 97(L) 106 101  CO2 22 - 32 mmol/L 26 23 22   Calcium 8.9 - 10.3 mg/dL 9.3 8.2(L) 8.4(L)    CBC Latest Ref Rng & Units 10/21/2016 07/29/2016 07/08/2016  WBC 3.6 - 11.0 K/uL 6.3 7.6 6.7   Hemoglobin 12.0 - 16.0 g/dL 11.5(L) 12.6 10.5(L)  Hematocrit 35.0 - 47.0 % 33.6(L) 36.7 31.3(L)  Platelets 150 - 440 K/uL 147(L) 147(L) 224    CXR:  No recent films  IMPRESSION:     ICD-10-CM   1. Smoker F17.200   2. Cirrhosis of liver without ascites, unspecified hepatic cirrhosis type (HCC) K74.60 CBC    Protime-INR  3. Lung nodule R91.1 CBC    Protime-INR   The lung nodule has a CT appearance consistent with malignancy. On PET scan SUV score is low consistent with low-grade malignancy. There is a right kidney lesion which appears to have remained stable since November 2017. However reports raise concern for RCC. It's possible that the lung nodule could represent metastasis.  PLAN:  A navigation bronchoscopy has been arranged for next week to be performed by Dr. Mortimer Fries  CBC, prothrombin time to be performed prior to the procedure  Follow-up will be arranged as needed   Merton Border, MD PCCM service Mobile [phone] Pager [phone] 11/14/2016 3:48 PM

## 2016-11-17 ENCOUNTER — Other Ambulatory Visit: Payer: Self-pay | Admitting: Gastroenterology

## 2016-11-17 DIAGNOSIS — K7031 Alcoholic cirrhosis of liver with ascites: Secondary | ICD-10-CM

## 2016-11-18 ENCOUNTER — Encounter
Admission: RE | Admit: 2016-11-18 | Discharge: 2016-11-18 | Disposition: A | Discharge status: Home / Self Care | Payer: Medicare Other | Source: Ambulatory Visit | Attending: Internal Medicine | Admitting: Internal Medicine

## 2016-11-18 ENCOUNTER — Encounter: Payer: Self-pay | Admitting: Nurse Practitioner

## 2016-11-18 ENCOUNTER — Ambulatory Visit (HOSPITAL_BASED_OUTPATIENT_CLINIC_OR_DEPARTMENT_OTHER): Payer: Medicare Other | Admitting: Nurse Practitioner

## 2016-11-18 VITALS — BP 95/82 | HR 76 | Temp 97.9°F | Resp 18 | Ht 70.0 in | Wt 181.0 lb

## 2016-11-18 DIAGNOSIS — M7918 Myalgia, other site: Secondary | ICD-10-CM

## 2016-11-18 DIAGNOSIS — Z79891 Long term (current) use of opiate analgesic: Secondary | ICD-10-CM | POA: Diagnosis not present

## 2016-11-18 DIAGNOSIS — M47816 Spondylosis without myelopathy or radiculopathy, lumbar region: Secondary | ICD-10-CM | POA: Diagnosis not present

## 2016-11-18 DIAGNOSIS — G894 Chronic pain syndrome: Secondary | ICD-10-CM

## 2016-11-18 DIAGNOSIS — G8929 Other chronic pain: Secondary | ICD-10-CM

## 2016-11-18 DIAGNOSIS — G4700 Insomnia, unspecified: Secondary | ICD-10-CM | POA: Diagnosis not present

## 2016-11-18 DIAGNOSIS — M4696 Unspecified inflammatory spondylopathy, lumbar region: Secondary | ICD-10-CM

## 2016-11-18 DIAGNOSIS — E1165 Type 2 diabetes mellitus with hyperglycemia: Secondary | ICD-10-CM | POA: Diagnosis not present

## 2016-11-18 DIAGNOSIS — Z823 Family history of stroke: Secondary | ICD-10-CM | POA: Diagnosis not present

## 2016-11-18 DIAGNOSIS — M791 Myalgia: Secondary | ICD-10-CM | POA: Diagnosis not present

## 2016-11-18 DIAGNOSIS — M5416 Radiculopathy, lumbar region: Secondary | ICD-10-CM

## 2016-11-18 DIAGNOSIS — Z9851 Tubal ligation status: Secondary | ICD-10-CM | POA: Diagnosis not present

## 2016-11-18 DIAGNOSIS — M533 Sacrococcygeal disorders, not elsewhere classified: Secondary | ICD-10-CM

## 2016-11-18 DIAGNOSIS — I959 Hypotension, unspecified: Secondary | ICD-10-CM | POA: Diagnosis not present

## 2016-11-18 DIAGNOSIS — K7031 Alcoholic cirrhosis of liver with ascites: Secondary | ICD-10-CM | POA: Diagnosis not present

## 2016-11-18 DIAGNOSIS — M79605 Pain in left leg: Secondary | ICD-10-CM | POA: Diagnosis not present

## 2016-11-18 DIAGNOSIS — M79602 Pain in left arm: Secondary | ICD-10-CM | POA: Diagnosis not present

## 2016-11-18 DIAGNOSIS — D62 Acute posthemorrhagic anemia: Secondary | ICD-10-CM | POA: Diagnosis not present

## 2016-11-18 DIAGNOSIS — Z79899 Other long term (current) drug therapy: Secondary | ICD-10-CM | POA: Diagnosis not present

## 2016-11-18 DIAGNOSIS — Z803 Family history of malignant neoplasm of breast: Secondary | ICD-10-CM | POA: Diagnosis not present

## 2016-11-18 DIAGNOSIS — M545 Low back pain: Secondary | ICD-10-CM | POA: Diagnosis not present

## 2016-11-18 DIAGNOSIS — Z5181 Encounter for therapeutic drug level monitoring: Secondary | ICD-10-CM | POA: Diagnosis present

## 2016-11-18 DIAGNOSIS — K420 Umbilical hernia with obstruction, without gangrene: Secondary | ICD-10-CM | POA: Diagnosis not present

## 2016-11-18 DIAGNOSIS — M79601 Pain in right arm: Secondary | ICD-10-CM | POA: Diagnosis not present

## 2016-11-18 DIAGNOSIS — M5412 Radiculopathy, cervical region: Secondary | ICD-10-CM | POA: Diagnosis not present

## 2016-11-18 DIAGNOSIS — F17210 Nicotine dependence, cigarettes, uncomplicated: Secondary | ICD-10-CM | POA: Diagnosis not present

## 2016-11-18 MED ORDER — OXYCODONE HCL 20 MG PO TABS: 1.0000 | ORAL_TABLET | Freq: Four times a day (QID) | ORAL | 0 refills | Status: DC | PRN

## 2016-11-18 MED ORDER — OXYCODONE HCL 20 MG PO TABS: 1.0000 | ORAL_TABLET | Freq: Four times a day (QID) | ORAL | 0 refills | Status: DC

## 2016-11-18 MED ORDER — ORPHENADRINE CITRATE 30 MG/ML IJ SOLN
60.0000 mg | Freq: Once | INTRAMUSCULAR | Status: AC
  Administered 2016-11-18: 60 mg via INTRAMUSCULAR
  Filled 2016-11-18: qty 2

## 2016-11-18 MED ORDER — KETOROLAC TROMETHAMINE 60 MG/2ML IM SOLN
60.0000 mg | Freq: Once | INTRAMUSCULAR | Status: AC
  Administered 2016-11-18: 60 mg via INTRAMUSCULAR
  Filled 2016-11-18: qty 2

## 2016-11-18 NOTE — Patient Instructions (Addendum)
____________________________________________________________________________________________  Medication Rules  Applies to: All patients receiving prescriptions (written or electronic).  Pharmacy of record: Pharmacy where electronic prescriptions will be sent. If written prescriptions are taken to a different pharmacy, please inform the nursing staff. The pharmacy listed in the electronic medical record should be the one where you would like electronic prescriptions to be sent.  Prescription refills: Only during scheduled appointments. Applies to both, written and electronic prescriptions.  NOTE: The following applies primarily to controlled substances (Opioid* Pain Medications).   [name] responsibilities: 1. Pain Pills: Bring all pain pills to every appointment (except for procedure appointments). 2. Pill Bottles: Bring pills in original pharmacy bottle. Always bring newest bottle. Bring bottle, even if empty. 3. Medication refills: You are responsible for knowing and keeping track of what medications you need refilled. The day before your appointment, write a list of all prescriptions that need to be refilled. Bring that list to your appointment and give it to the admitting nurse. Prescriptions will be written only during appointments. If you forget a medication, it will not be "Called in", "Faxed", or "electronically sent". You will need to get another appointment to get these prescribed. 4. Prescription Accuracy: You are responsible for carefully inspecting your prescriptions before leaving our office. Have the discharge nurse carefully go over each prescription with you, before taking them home. Make sure that your name is accurately spelled, that your address is correct. Check the name and dose of your medication to make sure it is accurate. Check the number of pills, and the written instructions to make sure they are clear and accurate. Make sure that you are given enough medication to  last until your next medication refill appointment. 5. Taking Medication: Take medication as prescribed. Never take more pills than instructed. Never take medication more frequently than prescribed. Taking less pills or less frequently is permitted and encouraged, when it comes to controlled substances (written prescriptions).  6. Inform other Doctors: Always inform, all of your healthcare providers, of all the medications you take. 7. Pain Medication from other Providers: You are not allowed to accept any additional pain medication from any other Doctor or Healthcare provider. There are two exceptions to this rule. (see below) In the event that you require additional pain medication, you are responsible for notifying us, as stated below. 8. Medication Agreement: You are responsible for carefully reading and following our Medication Agreement. This must be signed before receiving any prescriptions from our practice. Safely store a copy of your signed Agreement. Violations to the Agreement will result in no further prescriptions. (Additional copies of our Medication Agreement are available upon request.) 9. Laws, Rules, & Regulations: All patients are expected to follow all Federal and State Laws, Statutes, Rules, & Regulations. Ignorance of the Laws does not constitute a valid excuse. The use of any illegal substances is prohibited. 10. Adopted CDC guidelines & recommendations: Target dosing levels will be at or below 60 MME/day. Use of benzodiazepines** is not recommended.  Exceptions: There are only two exceptions to the rule of not receiving pain medications from other Healthcare Providers. 1. Exception #1 (Emergencies): In the event of an emergency (i.e.: accident requiring emergency care), you are allowed to receive additional pain medication. However, you are responsible for: As soon as you are able, call our office [phone], at any time of the day or night, and leave a message stating your  name, the date and nature of the emergency, and the name and dose of the medication   prescribed. In the event that your call is answered by a member of our staff, make sure to document and save the date, time, and the name of the person that took your information.  2. Exception #2 (Planned Surgery): In the event that you are scheduled by another doctor or dentist to have any type of surgery or procedure, you are allowed (for a period no longer than 30 days), to receive additional pain medication, for the acute post-op pain. However, in this case, you are responsible for picking up a copy of our "Post-op Pain Management for Surgeons" handout, and giving it to your surgeon or dentist. This document is available at our office, and does not require an appointment to obtain it. Simply go to our office during business hours (Monday-Thursday from 8:00 AM to 4:00 PM) (Friday 8:00 AM to 12:00 Noon) or if you have a scheduled appointment with [place], prior to your surgery, and ask for it by name. In addition, you will need to provide [place] with your name, name of your surgeon, type of surgery, and date of procedure or surgery.  *Opioid medications include: morphine, codeine, oxycodone, oxymorphone, hydrocodone, hydromorphone, meperidine, tramadol, tapentadol, buprenorphine, fentanyl, methadone. **Benzodiazepine medications include: diazepam (Valium), alprazolam (Xanax), clonazepam (Klonopine), lorazepam (Ativan), clorazepate (Tranxene), chlordiazepoxide (Librium), estazolam (Prosom), oxazepam (Serax), temazepam (Restoril), triazolam (Halcion)  ____________________________________________________________________________________________ BMI Assessment: Estimated body mass index is 25.97 kg/m as calculated from the following:   Height as of this encounter: 5\' 10"  (1.778 m).   Weight as of this encounter: 181 lb (82.1 kg).

## 2016-11-18 NOTE — Progress Notes (Signed)
Nursing Pain Medication Assessment:  Safety precautions to be maintained throughout the outpatient stay will include: orient to surroundings, keep bed in low position, maintain call bell within reach at all times, provide assistance with transfer out of bed and ambulation.  Medication Inspection Compliance: Pill count conducted under aseptic conditions, in front of the patient. Neither the pills nor the bottle was removed from the patient's sight at any time. Once count was completed pills were immediately returned to the patient in their original bottle.  Medication: Oxycodone IR Pill/Patch Count: 0 of 120 pills remain Pill/Patch Appearance: Markings consistent with prescribed medication Bottle Appearance: Standard pharmacy container. Clearly labeled. Filled Date: 07 / 24 / 2018 Last Medication intake:  Day before yesterday

## 2016-11-18 NOTE — Patient Instructions (Signed)
Your procedure is scheduled on: Thursday 11/25/16 Report to [place]. 2ND FLOOR MEDICAL MALL ENTRANCE. To find out your arrival time please call [phone] between 1PM - 3PM on Wednesday 11/24/16.  Remember: Instructions that are not followed completely may result in serious medical risk, up to and including death, or upon the discretion of your surgeon and anesthesiologist your surgery may need to be rescheduled.    __X__ 1. Do not eat food or drink liquids after midnight. No gum chewing or hard candies.     __X__ 2. No Alcohol for 24 hours before or after surgery.   ____ 3. Bring all medications with you on the day of surgery if instructed.    __X__ 4. Notify your doctor if there is any change in your medical condition     (cold, fever, infections).             ___X__5. No smoking within 24 hours of your surgery.     Do not wear jewelry, make-up, hairpins, clips or nail polish.  Do not wear lotions, powders, or perfumes.   Do not shave 48 hours prior to surgery. Men may shave face and neck.  Do not bring valuables to the hospital.    [facility] is not responsible for any belongings or valuables.               Contacts, dentures or bridgework may not be worn into surgery.  Leave your suitcase in the car. After surgery it may be brought to your room.  For patients admitted to the hospital, discharge time is determined by your                treatment team.   Patients discharged the day of surgery will not be allowed to drive home.   Please read over the following fact sheets that you were given:   MRSA Information   __X__ Take these medicines the morning of surgery with A SIP OF WATER:    1. BUSPAR  2. MAGNESIUM  3. PROTONIX  4. PAXIL  5. LYRICA  6. MAY TAKE OXYCODONE ONLY IF NEEDED  ____ Fleet Enema (as directed)   ____ Use CHG Soap as directed  ____ Use inhalers on the day of surgery  ____ Stop metformin 2 days prior to surgery    ____ Take 1/2 of usual insulin  dose the night before surgery and none on the morning of surgery.   ____ Stop Coumadin/Plavix/aspirin on   __X__ Stop Anti-inflammatories such as Advil, Aleve, Ibuprofen, Motrin, Naproxen, Naprosyn, Goodies,powder, or aspirin products.     ____ Stop supplements until after surgery.    ____ Bring C-Pap to the hospital.

## 2016-11-18 NOTE — Progress Notes (Signed)
[name] Name: Audrey Garrett  MRN: [id number]  Referring Provider: Jodi Marble, MD  DOB: [birth year]  PCP: Audrey Marble, MD  DOS: 11/18/2016  Note by: Audrey Francois NP  Service setting: Ambulatory outpatient  Specialty: Interventional Pain Management  Location: [facility] (AMB) Pain Management Facility    Patient type: Established    Primary Reason(s) for Visit: Encounter for prescription drug management. (Level of risk: moderate)  CC: Leg Pain (left- anterior from thigh through to toes); Back Pain (low); and Arm Pain (bilateral )  HPI  [name] is a 56 y.o. year old, female patient, who comes today for a medication management evaluation. She has Insomnia, persistent; BP (high blood pressure); Cervical radicular pain; Uncomplicated opioid dependence ([place]); Long term prescription opiate use; Failed back surgical syndrome; Lumbar facet syndrome (Bilateral) (L>R); Osteoarthritis of spine with radiculopathy, lumbar region; Musculoskeletal pain; Chronic low back pain (Location of Primary Source of Pain) (Bilateral) (L>R); HCV (hepatitis C virus); At risk for osteopenia; Lumbar spondylosis; Chronic lumbar radicular pain (Location of Secondary source of pain) (Left); Chronic neck pain; Chronic lower extremity pain (Left); Chronic sacroiliac joint pain (B) (R>L); Chronic upper back pain; Long term current use of opiate analgesic; Encounter for chronic pain management; Hypomagnesemia (low magnesium levels); Hypotension; Anemia; Thrombocytopenia ([place]); Coagulopathy ([place]); Hyperglycemia; Polyneuropathy; Urinary retention; Difficulty urinating; Nephrolithiasis; Alcoholic cirrhosis of liver with ascites ([place]); Depression, major, recurrent, moderate ([place]); Abdominal pain, chronic, epigastric; Hepatic cirrhosis ([place]); Acute GI bleeding; Neurogenic pain; S/P thoracolumbar fusion (T5-L5); Grade 1 Retrolisthesis (4 mm) of C5 over C6 and C6 over C7; Cervical foraminal stenosis (Left C5-6; Bilateral C6-7); Hernia of  anterior abdominal wall; Incarcerated umbilical hernia; Controlled type 2 diabetes mellitus with hyperglycemia ([place]); Acute blood loss anemia; Chronic pain syndrome; Malnutrition of moderate degree; Iron deficiency anemia due to chronic blood loss; Opiate use; RLS (restless legs syndrome); Bilateral leg edema; Depression, major, in remission ([place]); Chronic hip pain (Bilateral); Osteoarthritis of hip (Bilateral); Personal history of tobacco use, presenting hazards to health; and Chronic groin pain, left on her problem list. Her primarily concern today is the Leg Pain (left- anterior from thigh through to toes); Back Pain (low); and Arm Pain (bilateral )  Pain Assessment: Location: Lower Back Radiating: left leg Onset: More than a month ago Duration: Chronic pain Quality: Dull, [place], Shooting Severity: 5 /10 (self-reported pain score)  Note: Reported level is compatible with observation.                   Effect on ADL:   Timing: Constant Modifying factors: medications, standing  [name] was last scheduled for an appointment on 09/14/2016 for medication management. During today's appointment we reviewed [name]'s chronic pain status, as well as her outpatient medication regimen. She admits that she is out of her miedication. She felt like her prescription should have restarted on yesterday. She is concern about her chronic pain. She does not feel like the injection are effective and she does not feel like they will help in the future. She admits that her pain is worse in her left leg mostly in the thigh area. 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 She is sadden by this being cancer and her having chronic pain.   The patient  reports that she does not use drugs. Her body mass index is 25.97 kg/m.  Further details on  both, my assessment(s), as well as the proposed treatment plan, please see  below.  Controlled Substance Pharmacotherapy Assessment REMS (Risk Evaluation and Mitigation Strategy)  Analgesic:Oxycodone IR 20 mg every 6 (80 mg/day) MME/day:120 mg/day. Audrey Rochester, RN  11/18/2016  9:39 AM  Sign at close encounter Nursing Pain Medication Assessment:  Safety precautions to be maintained throughout the outpatient stay will include: orient to surroundings, keep bed in low position, maintain call bell within reach at all times, provide assistance with transfer out of bed and ambulation.  Medication Inspection Compliance: Pill count conducted under aseptic conditions, in front of the patient. Neither the pills nor the bottle was removed from the patient's sight at any time. Once count was completed pills were immediately returned to the patient in their original bottle.  Medication: Oxycodone IR Pill/Patch Count: 0 of 120 pills remain Pill/Patch Appearance: Markings consistent with prescribed medication Bottle Appearance: Standard pharmacy container. Clearly labeled. Filled Date: 07 / 24 / 2018 Last Medication intake:  Day before yesterday   Pharmacokinetics: Liberation and absorption (onset of action): WNL Distribution (time to peak effect): WNL Metabolism and excretion (duration of action): WNL         Pharmacodynamics: Desired effects: Analgesia: [name] reports >50% benefit. Functional ability: Patient reports that medication allows her to accomplish basic ADLs Clinically meaningful improvement in function (CMIF): Sustained CMIF goals met Perceived effectiveness: Described as relatively effective, allowing for increase in activities of daily living (ADL) Undesirable effects: Side-effects or Adverse reactions: None reported Monitoring: [place] PMP: Online review of the past 5-monthperiod conducted. Compliant with practice rules and regulations List of all UDS test(s) done:  Lab Results  Component Value Date   TOXASSSELUR FINAL 05/20/2016   [place] FINAL 12/22/2015   [place] 10/01/2015   [place] 07/02/2015   [place] 04/03/2015   Last UDS on record: ToxAssure Select 13  Date Value Ref Range Status  05/20/2016 FINAL  Final    Comment:    ==================================================================== TOXASSURE SELECT 13 (MW) ==================================================================== Specimen Alert Note:  Urinary creatinine is low; ability to detect some drugs may be compromised.  Interpret results with caution. ==================================================================== Test                             Result       Flag       Units Drug Present and Declared for Prescription Verification   Oxycodone                      8680         EXPECTED   ng/mg creat   Noroxycodone                   13710        EXPECTED   ng/mg creat    Sources of oxycodone include scheduled prescription medications.    Noroxycodone is an expected metabolite of oxycodone. ==================================================================== Test                      Result    Flag   Units      Ref Range   Creatinine              10        L      mg/dL      >=20 ==================================================================== Declared Medications:  The flagging and interpretation on this report  are based on the  following declared medications.  Unexpected results may arise from  inaccuracies in the declared medications.  **Note: The testing scope of this panel includes these medications:  Oxycodone  **Note: The testing scope of this panel does not include following  reported medications:  Calcium Carbonate (Tums)  Ciprofloxacin (Cipro)  Furosemide (Lasix)  Lactulose  Ledipasvir  Magnesium (Mag-Ox)  Multivitamin (MVI)  Omeprazole (Prilosec)  Paroxetine (Paxil)  Pregabalin (Lyrica)  Sofosbuvir  Spironolactone (Aldactone)  Sucralfate (Carafate)  Vitamin B1  Vitamin  K ==================================================================== For clinical consultation, please call [phone]. ====================================================================    UDS interpretation: Compliant          Medication Assessment Form: Reviewed. Patient indicates being compliant with therapy Treatment compliance: Non-compliant Risk Assessment Profile: Aberrant behavior: See prior evaluations. None observed or detected today Comorbid factors increasing risk of overdose: See prior notes. No additional risks detected today Risk of substance use disorder (SUD): Low     Opioid Risk Tool - 10/05/16 1507      Family History of Substance Abuse   Alcohol Negative   Illegal Drugs Negative   Rx Drugs Negative     Personal History of Substance Abuse   Alcohol Negative   Illegal Drugs Negative   Rx Drugs Negative     Age   Age between 44-45 years  No     History of Preadolescent Sexual Abuse   History of Preadolescent Sexual Abuse Negative or Female     Psychological Disease   Psychological Disease Negative   Depression Positive     Total Score   Opioid Risk Tool Scoring 1   Opioid Risk Interpretation Low Risk     ORT Scoring interpretation table:  Score <3 = Low Risk for SUD  Score between 4-7 = Moderate Risk for SUD  Score >8 = High Risk for Opioid Abuse   Risk Mitigation Strategies:  Patient Counseling: Completed today. Counseling provided to patient as per "Patient Counseling Document". Document signed by patient, attesting to counseling and understanding Patient-Prescriber Agreement (PPA): Present and active  Notification to other healthcare providers: Done  Pharmacologic Plan: No change in therapy, at this time  Laboratory Chemistry  Inflammation Markers (CRP: Acute Phase) (ESR: Chronic Phase) Lab Results  Component Value Date   CRP <0.5 04/03/2015   ESRSEDRATE 27 04/03/2015                 Renal Function Markers Lab Results   Component Value Date   BUN 7 04/12/2016   CREATININE 0.69 04/12/2016   GFRAA >60 04/12/2016   GFRNONAA >60 04/12/2016                 Hepatic Function Markers Lab Results  Component Value Date   AST 20 04/12/2016   ALT 9 (L) 04/12/2016   ALBUMIN 3.5 04/12/2016   ALKPHOS 59 04/12/2016                 Electrolytes Lab Results  Component Value Date   NA 130 (L) 04/12/2016   K 3.4 (L) 04/12/2016   CL 97 (L) 04/12/2016   CALCIUM 9.3 04/12/2016   MG 1.9 01/22/2016                 Neuropathy Markers Lab Results  Component Value Date   VITAMINB12 591 02/13/2016                 Bone Pathology Markers Lab Results  Component Value Date   [facility]  59 04/12/2016   CALCIUM 9.3 04/12/2016                 Coagulation Parameters Lab Results  Component Value Date   INR 1.21 04/01/2016   LABPROT 15.4 (H) 04/01/2016   APTT 34 01/20/2016   PLT 147 (L) 10/21/2016                 Cardiovascular Markers Lab Results  Component Value Date   BNP 929 (H) 09/23/2013   HGB 11.5 (L) 10/21/2016   HCT 33.6 (L) 10/21/2016                 Note: Lab results reviewed.  Recent Diagnostic Imaging Review  No results found. Note: Imaging results reviewed.          Meds   Current Meds  Medication Sig  . busPIRone (BUSPAR) 15 MG tablet Take 15 mg by mouth 2 (two) times daily.  . furosemide (LASIX) 40 MG tablet Take 40 mg by mouth 2 (two) times daily.   Marland Kitchen lactulose (CHRONULAC) 10 GM/15ML solution Take 10 g by mouth 3 (three) times daily as needed for mild constipation or moderate constipation.   . magnesium oxide (MAG-OX) 400 MG tablet Take 400 mg by mouth daily.  . Multiple Vitamins-Minerals (CENTRUM SILVER 50+WOMEN PO) Take 1 tablet by mouth daily.  Derrill Memo ON 12/18/2016] Oxycodone HCl 20 MG TABS Take 1 tablet (20 mg total) by mouth every 6 (six) hours as needed.  Derrill Memo ON 01/17/2017] Oxycodone HCl 20 MG TABS Take 1 tablet (20 mg total) by mouth every 6 (six) hours as needed.  .  pantoprazole (PROTONIX) 40 MG tablet TAKE 1 TABLET (40 MG TOTAL) BY MOUTH 2 (TWO) TIMES DAILY.  Marland Kitchen PARoxetine (PAXIL) 40 MG tablet Take 40 mg by mouth every morning.  . pregabalin (LYRICA) 100 MG capsule Take 100 mg by mouth 2 (two) times daily.  Marland Kitchen spironolactone (ALDACTONE) 50 MG tablet TAKE 2 TABLETS BY MOUTH EVERY DAY  . thiamine (VITAMIN B-1) 100 MG tablet Take 1 tablet (100 mg total) by mouth daily.  . [DISCONTINUED] Oxycodone HCl 20 MG TABS Take 1 tablet (20 mg total) by mouth every 6 (six) hours as needed.  . [DISCONTINUED] Oxycodone HCl 20 MG TABS Take 1 tablet (20 mg total) by mouth every 6 (six) hours as needed. (Patient taking differently: Take 1 tablet by mouth 4 (four) times daily. )    ROS  Constitutional: Denies any fever or chills Gastrointestinal: No reported hemesis, hematochezia, vomiting, or acute GI distress Musculoskeletal: Denies any acute onset joint swelling, redness, loss of ROM, or weakness Neurological: No reported episodes of acute onset apraxia, aphasia, dysarthria, agnosia, amnesia, paralysis, loss of coordination, or loss of consciousness  Allergies  Ms. Feeley has No Known Allergies.  PFSH  Drug: Ms. Polizzi  reports that she does not use drugs. Alcohol:  reports that she does not drink alcohol. Tobacco:  reports that she has been smoking Cigarettes.  She has a 42.00 pack-year smoking history. She has never used smokeless tobacco. Medical:  has a past medical history of Alcohol abuse; Alcoholic cirrhosis of liver without ascites ([place]); Anemia; Anxiety; Arthropathy; Back pain; Bronchitis; BRONCHITIS, ACUTE (11/01/2009); Chronic hepatitis C ([place]); Chronic LBP (12/25/2014); Cirrhosis ([place]); DDD (degenerative disc disease), lumbar (01/01/2015); Depression; Diabetes mellitus without complication ([place]); Diverticulosis; Edema; Fatigue; Fibromyalgia; GERD (gastroesophageal reflux disease); High risk medications (not anticoagulants) long-term use; Hyperglycemia; Hypertension;  Kidney mass; Left leg pain (01/01/2015); Lumbago; Lumbar radicular  pain (01/01/2015); Muscle spasm; Neck pain (01/01/2015); Polyarthritis; Reflux; Restless leg syndrome; RLS (restless legs syndrome); Sacroiliac pain (01/01/2015); Shoulder pain; Sinusitis; SOB (shortness of breath); Spine disorder; Stress headaches; Upper back pain (01/01/2015); Urinary frequency; and Vitamin D deficiency disease. Surgical: Ms. Seals  has a past surgical history that includes Tubal ligation; Tonsillectomy; Back surgery (12/19/2011); Esophagogastroduodenoscopy (N/A, 04/14/2015); Esophagogastroduodenoscopy (egd) with propofol (N/A, 09/16/2015); Colonoscopy with propofol (N/A, 09/25/2015); Umbilical hernia repair (N/A, 11/06/2015); Hernia repair (N/A, 10/2015); Esophagogastroduodenoscopy (egd) with propofol (N/A, 01/22/2016); Back surgery; Colonoscopy with propofol (N/A, 04/01/2016); and Esophagogastroduodenoscopy (egd) with propofol (N/A, 04/01/2016). Family: family history includes [name] in her father; Breast cancer (age of onset: 30) in her sister; Heart disease in her mother; Stroke in her mother.  Constitutional Exam  General appearance: Well nourished, well developed, and well hydrated. In no apparent acute distress Vitals:   11/18/16 0930  BP: 95/82  Pulse: 76  Resp: 18  Temp: 97.9 F (36.6 C)  TempSrc: Oral  SpO2: 100%  Weight: 181 lb (82.1 kg)  Height: 5' 10" (1.778 m)   BMI Assessment: Estimated body mass index is 25.97 kg/m as calculated from the following:   Height as of this encounter: 5' 10" (1.778 m).   Weight as of this encounter: 181 lb (82.1 kg). Psych/Mental status: Alert, oriented x 3 (person, place, & time)       Eyes: PERLA Respiratory: No evidence of acute respiratory distress  Lumbar Spine Area Exam  Skin & Axial Inspection: Well healed scar from previous spine surgery detected Alignment: Symmetrical Functional ROM: Unrestricted ROM      Stability: No instability detected Muscle  Tone/Strength: Functionally intact. No obvious neuro-muscular anomalies detected. Sensory (Neurological): Unimpaired Palpation: Complains of area being tender to palpation       Provocative Tests: Lumbar Hyperextension and rotation test: Positive bilaterally for facet joint pain. Lumbar Lateral bending test: evaluation deferred today       Patrick's Maneuver: evaluation deferred today                    Gait & Posture Assessment  Ambulation: Unassisted Gait: Relatively normal for age and body habitus Posture: WNL   Lower Extremity Exam    Side: Right lower extremity  Side: Left lower extremity  Skin & Extremity Inspection: non pitting edema with discoloration; mottling  Skin & Extremity Inspection:  non pitting edema with discoloration; mottling  Functional ROM: Unrestricted ROM          Functional ROM: Unrestricted ROM          Muscle Tone/Strength: Functionally intact. No obvious neuro-muscular anomalies detected.  Muscle Tone/Strength: Functionally intact. No obvious neuro-muscular anomalies detected.  Sensory (Neurological): Unimpaired  Sensory (Neurological): Unimpaired  Palpation: No palpable anomalies  Palpation: No palpable anomalies   Assessment  Primary Diagnosis & Pertinent Problem List: The primary encounter diagnosis was Lumbar facet syndrome (Bilateral) (L>R). Diagnoses of Chronic lumbar radicular pain (Location of Secondary source of pain) (Left), Chronic sacroiliac joint pain (B) (R>L), Lumbar spondylosis, Chronic pain syndrome, and Musculoskeletal pain were also pertinent to this visit.  Status Diagnosis  Persistent Persistent Persistent 1. Lumbar facet syndrome (Bilateral) (L>R)   2. Chronic lumbar radicular pain (Location of Secondary source of pain) (Left)   3. Chronic sacroiliac joint pain (B) (R>L)   4. Lumbar spondylosis   5. Chronic pain syndrome   6. Musculoskeletal pain     Problems updated and reviewed during this visit: No problems updated. Plan of  Care  Pharmacotherapy (Medications Ordered): Meds ordered this encounter  Medications  . Oxycodone HCl 20 MG TABS    Sig: Take 1 tablet (20 mg total) by mouth every 6 (six) hours as needed.    Dispense:  120 tablet    Refill:  0    Fill one day early if pharmacy is closed on scheduled refill date. Do not fill until: 12/18/2016 To last until: 01/17/2017    Order Specific Question:   Supervising Provider    Answer:   [name] [phone]  . Oxycodone HCl 20 MG TABS    Sig: Take 1 tablet (20 mg total) by mouth every 6 (six) hours as needed.    Dispense:  120 tablet    Refill:  0    Fill one day early if pharmacy is closed on scheduled refill date. Do not fill until:01/17/2017 To last until: 02/16/2017    Order Specific Question:   Supervising Provider    Answer:   [name] [phone]  . Oxycodone HCl 20 MG TABS    Sig: Take 1 tablet (20 mg total) by mouth every 6 (six) hours.    Dispense:  120 tablet    Refill:  0    Fill one day early if pharmacy is closed on scheduled refill date. Do not fill until: 02/16/2017 To last until: 03/18/2017    Order Specific Question:   Supervising Provider    Answer:   [name] [phone]  . orphenadrine (NORFLEX) injection 60 mg  . ketorolac (TORADOL) injection 60 mg   New Prescriptions   No medications on file   Medications administered today: We administered orphenadrine and ketorolac. Lab-work, procedure(s), and/or referral(s): Orders Placed This Encounter  Procedures  . ToxASSURE Select 13 (MW), Urine   Imaging and/or referral(s): None  Interventional therapies: Planned, scheduled, and/or pending:   As scheduled. Additional labs deferred since pt had them completed in pre-op testing this morning.    Considering:   Diagnostic bilateral lumbar facetblock  Possible bilateral lumbar facet RFA Diagnostic left caudal Epiduralsteroid injection + diagnostic epidurogram Possible Racz procedure Diagnostic  bilateral sacroiliacjoint injection  Possible bilateral sacroiliac joint RFA Diagnostic left cervical epiduralsteroid injection  Diagnostic bilateral cervical facetblock  Possible bilateral cervical facet RFA   Palliative PRN treatment(s):   Diagnostic bilateral lumbar facet blockunder fluoroscopic guidance and IV sedation  Diagnostic bilateral intra-articular hipjoint injection under fluoroscopic guidance and IV sedation  Diagnostic bilateral sacroiliacjoint block under fluoroscopic guidance and IV sedation    Provider-requested follow-up: Return in about 3 months (around 02/18/2017) for MedMgmt.  Future Appointments Date Time Provider [place]  11/19/2016 10:30 AM [facility]  11/25/2016 11:00 AM Sindy Guadeloupe, MD [facility] None  01/04/2017 10:30 AM [name], MD [facility] None  02/10/2017 10:45 AM Audrey Francois, NP [facility] None  09/09/2017 10:30 AM Hollice Espy, MD [facility] None   Primary Care Physician: Audrey Marble, MD Location: [facility] Note by: Audrey Francois NP Date: 11/18/2016; Time: 1:04 PM  Pain Score Disclaimer: We use the NRS-11 scale. This is a self-reported, subjective measurement of pain severity with only modest accuracy. It is used primarily to identify changes within a particular patient. It must be understood that outpatient pain scales are significantly less accurate that those used for research, where they can be applied under ideal controlled circumstances with minimal exposure to variables. In reality, the score is likely to be a combination of pain intensity and pain affect, where  pain affect describes the degree of emotional arousal or changes in action readiness caused by the sensory experience of pain. Factors such as social and work situation, setting, emotional state, anxiety levels, expectation, and prior pain experience may influence pain perception and show large  inter-individual differences that may also be affected by time variables.  Patient instructions provided during this appointment: Patient Instructions   ____________________________________________________________________________________________  Medication Rules  Applies to: All patients receiving prescriptions (written or electronic).  Pharmacy of record: Pharmacy where electronic prescriptions will be sent. If written prescriptions are taken to a different pharmacy, please inform the nursing staff. The pharmacy listed in the electronic medical record should be the one where you would like electronic prescriptions to be sent.  Prescription refills: Only during scheduled appointments. Applies to both, written and electronic prescriptions.  NOTE: The following applies primarily to controlled substances (Opioid* Pain Medications).   [name] responsibilities: 1. Pain Pills: Bring all pain pills to every appointment (except for procedure appointments). 2. Pill Bottles: Bring pills in original pharmacy bottle. Always bring newest bottle. Bring bottle, even if empty. 3. Medication refills: You are responsible for knowing and keeping track of what medications you need refilled. The day before your appointment, write a list of all prescriptions that need to be refilled. Bring that list to your appointment and give it to the admitting nurse. Prescriptions will be written only during appointments. If you forget a medication, it will not be "Called in", "Faxed", or "electronically sent". You will need to get another appointment to get these prescribed. 4. Prescription Accuracy: You are responsible for carefully inspecting your prescriptions before leaving our office. Have the discharge nurse carefully go over each prescription with you, before taking them home. Make sure that your name is accurately spelled, that your address is correct. Check the name and dose of your medication to make sure it is  accurate. Check the number of pills, and the written instructions to make sure they are clear and accurate. Make sure that you are given enough medication to last until your next medication refill appointment. 5. Taking Medication: Take medication as prescribed. Never take more pills than instructed. Never take medication more frequently than prescribed. Taking less pills or less frequently is permitted and encouraged, when it comes to controlled substances (written prescriptions).  6. Inform other Doctors: Always inform, all of your healthcare providers, of all the medications you take. 7. Pain Medication from other Providers: You are not allowed to accept any additional pain medication from any other Doctor or Healthcare provider. There are two exceptions to this rule. (see below) In the event that you require additional pain medication, you are responsible for notifying us, as stated below. 8. Medication Agreement: You are responsible for carefully reading and following our Medication Agreement. This must be signed before receiving any prescriptions from our practice. Safely store a copy of your signed Agreement. Violations to the Agreement will result in no further prescriptions. (Additional copies of our Medication Agreement are available upon request.) 9. Laws, Rules, & Regulations: All patients are expected to follow all Federal and [employer], [employer], Rules, [employer]. Ignorance of the Laws does not constitute a valid excuse. The use of any illegal substances is prohibited. 10. Adopted CDC guidelines & recommendations: Target dosing levels will be at or below 60 MME/day. Use of benzodiazepines** is not recommended.  Exceptions: There are only two exceptions to the rule of not receiving pain medications from other Healthcare Providers. 1. Exception #1 (Emergencies):  In the event of an emergency (i.e.: accident requiring emergency care), you are allowed to receive additional pain medication.  However, you are responsible for: As soon as you are able, call our office [phone], at any time of the day or night, and leave a message stating your name, the date and nature of the emergency, and the name and dose of the medication prescribed. In the event that your call is answered by a member of our staff, make sure to document and save the date, time, and the name of the person that took your information.  2. Exception #2 (Planned Surgery): In the event that you are scheduled by another doctor or dentist to have any type of surgery or procedure, you are allowed (for a period no longer than 30 days), to receive additional pain medication, for the acute post-op pain. However, in this case, you are responsible for picking up a copy of our "Post-op Pain Management for Surgeons" handout, and giving it to your surgeon or dentist. This document is available at our office, and does not require an appointment to obtain it. Simply go to our office during business hours (Monday-Thursday from 8:00 AM to 4:00 PM) (Friday 8:00 AM to 12:00 Noon) or if you have a scheduled appointment with [place], prior to your surgery, and ask for it by name. In addition, you will need to provide [place] with your name, name of your surgeon, type of surgery, and date of procedure or surgery.  *Opioid medications include: morphine, codeine, oxycodone, oxymorphone, hydrocodone, hydromorphone, meperidine, tramadol, tapentadol, buprenorphine, fentanyl, methadone. **Benzodiazepine medications include: diazepam (Valium), alprazolam (Xanax), clonazepam (Klonopine), lorazepam (Ativan), clorazepate (Tranxene), chlordiazepoxide (Librium), estazolam (Prosom), oxazepam (Serax), temazepam (Restoril), triazolam (Halcion)  ____________________________________________________________________________________________ BMI Assessment: Estimated body mass index is 25.97 kg/m as calculated from the following:   Height as of this encounter: 5' 10" (1.778  m).   Weight as of this encounter: 181 lb (82.1 kg).

## 2016-11-19 ENCOUNTER — Telehealth: Payer: Self-pay | Admitting: Pulmonary Disease

## 2016-11-19 ENCOUNTER — Other Ambulatory Visit
Admission: RE | Admit: 2016-11-19 | Discharge: 2016-11-19 | Disposition: A | Discharge status: Home / Self Care | Payer: Medicare Other | Source: Ambulatory Visit | Attending: Pulmonary Disease | Admitting: Pulmonary Disease

## 2016-11-19 ENCOUNTER — Other Ambulatory Visit: Payer: Self-pay | Admitting: Gastroenterology

## 2016-11-19 ENCOUNTER — Ambulatory Visit
Admission: RE | Admit: 2016-11-19 | Discharge: 2016-11-19 | Disposition: A | Discharge status: Home / Self Care | Payer: Medicare Other | Source: Ambulatory Visit | Attending: Gastroenterology | Admitting: Gastroenterology

## 2016-11-19 DIAGNOSIS — E1165 Type 2 diabetes mellitus with hyperglycemia: Secondary | ICD-10-CM | POA: Insufficient documentation

## 2016-11-19 DIAGNOSIS — M5412 Radiculopathy, cervical region: Secondary | ICD-10-CM | POA: Insufficient documentation

## 2016-11-19 DIAGNOSIS — M4696 Unspecified inflammatory spondylopathy, lumbar region: Secondary | ICD-10-CM | POA: Insufficient documentation

## 2016-11-19 DIAGNOSIS — F17210 Nicotine dependence, cigarettes, uncomplicated: Secondary | ICD-10-CM | POA: Insufficient documentation

## 2016-11-19 DIAGNOSIS — M47816 Spondylosis without myelopathy or radiculopathy, lumbar region: Secondary | ICD-10-CM | POA: Insufficient documentation

## 2016-11-19 DIAGNOSIS — M79605 Pain in left leg: Secondary | ICD-10-CM | POA: Insufficient documentation

## 2016-11-19 DIAGNOSIS — I959 Hypotension, unspecified: Secondary | ICD-10-CM | POA: Insufficient documentation

## 2016-11-19 DIAGNOSIS — K420 Umbilical hernia with obstruction, without gangrene: Secondary | ICD-10-CM | POA: Insufficient documentation

## 2016-11-19 DIAGNOSIS — D62 Acute posthemorrhagic anemia: Secondary | ICD-10-CM | POA: Insufficient documentation

## 2016-11-19 DIAGNOSIS — M5416 Radiculopathy, lumbar region: Secondary | ICD-10-CM | POA: Insufficient documentation

## 2016-11-19 DIAGNOSIS — K7460 Unspecified cirrhosis of liver: Secondary | ICD-10-CM

## 2016-11-19 DIAGNOSIS — M545 Low back pain: Secondary | ICD-10-CM | POA: Insufficient documentation

## 2016-11-19 DIAGNOSIS — R911 Solitary pulmonary nodule: Secondary | ICD-10-CM

## 2016-11-19 DIAGNOSIS — Z823 Family history of stroke: Secondary | ICD-10-CM | POA: Insufficient documentation

## 2016-11-19 DIAGNOSIS — M791 Myalgia: Secondary | ICD-10-CM | POA: Insufficient documentation

## 2016-11-19 DIAGNOSIS — Z9851 Tubal ligation status: Secondary | ICD-10-CM | POA: Insufficient documentation

## 2016-11-19 DIAGNOSIS — G894 Chronic pain syndrome: Secondary | ICD-10-CM | POA: Diagnosis not present

## 2016-11-19 DIAGNOSIS — K7031 Alcoholic cirrhosis of liver with ascites: Secondary | ICD-10-CM

## 2016-11-19 DIAGNOSIS — G4700 Insomnia, unspecified: Secondary | ICD-10-CM | POA: Insufficient documentation

## 2016-11-19 DIAGNOSIS — Z5181 Encounter for therapeutic drug level monitoring: Secondary | ICD-10-CM | POA: Insufficient documentation

## 2016-11-19 DIAGNOSIS — Z79891 Long term (current) use of opiate analgesic: Secondary | ICD-10-CM | POA: Insufficient documentation

## 2016-11-19 DIAGNOSIS — M79601 Pain in right arm: Secondary | ICD-10-CM | POA: Insufficient documentation

## 2016-11-19 DIAGNOSIS — Z79899 Other long term (current) drug therapy: Secondary | ICD-10-CM | POA: Insufficient documentation

## 2016-11-19 DIAGNOSIS — Z803 Family history of malignant neoplasm of breast: Secondary | ICD-10-CM | POA: Insufficient documentation

## 2016-11-19 DIAGNOSIS — M79602 Pain in left arm: Secondary | ICD-10-CM | POA: Insufficient documentation

## 2016-11-19 LAB — PROTIME-INR
INR: 1.29
Prothrombin Time: 16.2 seconds — ABNORMAL HIGH (ref 11.4–15.2)

## 2016-11-19 NOTE — Telephone Encounter (Signed)
[facility] Dr. Alva Garnet does want those labs drawn.

## 2016-11-19 NOTE — Telephone Encounter (Signed)
Patient wants to talk about upcoming labs for procedure.  Please call.

## 2016-11-22 ENCOUNTER — Other Ambulatory Visit: Payer: Self-pay | Admitting: Pain Medicine

## 2016-11-22 ENCOUNTER — Institutional Professional Consult (permissible substitution): Payer: 59 | Admitting: Internal Medicine

## 2016-11-24 LAB — TOXASSURE SELECT 13 (MW), URINE

## 2016-11-25 ENCOUNTER — Ambulatory Visit: Payer: Medicare Other

## 2016-11-25 ENCOUNTER — Ambulatory Visit
Admission: RE | Admit: 2016-11-25 | Discharge: 2016-11-25 | Disposition: A | Discharge status: Home / Self Care | Payer: Medicare Other | Source: Ambulatory Visit | Attending: Internal Medicine | Admitting: Internal Medicine

## 2016-11-25 ENCOUNTER — Encounter: Payer: Self-pay | Admitting: *Deleted

## 2016-11-25 ENCOUNTER — Ambulatory Visit: Payer: Medicare Other | Admitting: Anesthesiology

## 2016-11-25 ENCOUNTER — Inpatient Hospital Stay: Payer: Medicare Other | Admitting: Oncology

## 2016-11-25 ENCOUNTER — Encounter
Admission: RE | Disposition: A | Discharge status: Home / Self Care | Payer: Self-pay | Source: Ambulatory Visit | Attending: Internal Medicine

## 2016-11-25 DIAGNOSIS — M797 Fibromyalgia: Secondary | ICD-10-CM | POA: Insufficient documentation

## 2016-11-25 DIAGNOSIS — E119 Type 2 diabetes mellitus without complications: Secondary | ICD-10-CM | POA: Diagnosis not present

## 2016-11-25 DIAGNOSIS — Z79899 Other long term (current) drug therapy: Secondary | ICD-10-CM | POA: Diagnosis not present

## 2016-11-25 DIAGNOSIS — F1010 Alcohol abuse, uncomplicated: Secondary | ICD-10-CM | POA: Insufficient documentation

## 2016-11-25 DIAGNOSIS — K7030 Alcoholic cirrhosis of liver without ascites: Secondary | ICD-10-CM | POA: Diagnosis not present

## 2016-11-25 DIAGNOSIS — E559 Vitamin D deficiency, unspecified: Secondary | ICD-10-CM | POA: Diagnosis not present

## 2016-11-25 DIAGNOSIS — K219 Gastro-esophageal reflux disease without esophagitis: Secondary | ICD-10-CM | POA: Diagnosis not present

## 2016-11-25 DIAGNOSIS — R911 Solitary pulmonary nodule: Secondary | ICD-10-CM

## 2016-11-25 DIAGNOSIS — F419 Anxiety disorder, unspecified: Secondary | ICD-10-CM | POA: Insufficient documentation

## 2016-11-25 DIAGNOSIS — I10 Essential (primary) hypertension: Secondary | ICD-10-CM | POA: Insufficient documentation

## 2016-11-25 DIAGNOSIS — B182 Chronic viral hepatitis C: Secondary | ICD-10-CM | POA: Diagnosis not present

## 2016-11-25 DIAGNOSIS — F329 Major depressive disorder, single episode, unspecified: Secondary | ICD-10-CM | POA: Insufficient documentation

## 2016-11-25 DIAGNOSIS — C3411 Malignant neoplasm of upper lobe, right bronchus or lung: Secondary | ICD-10-CM | POA: Diagnosis not present

## 2016-11-25 DIAGNOSIS — F17210 Nicotine dependence, cigarettes, uncomplicated: Secondary | ICD-10-CM | POA: Diagnosis not present

## 2016-11-25 DIAGNOSIS — G2581 Restless legs syndrome: Secondary | ICD-10-CM | POA: Insufficient documentation

## 2016-11-25 DIAGNOSIS — Z8719 Personal history of other diseases of the digestive system: Secondary | ICD-10-CM | POA: Diagnosis not present

## 2016-11-25 DIAGNOSIS — Z7984 Long term (current) use of oral hypoglycemic drugs: Secondary | ICD-10-CM | POA: Insufficient documentation

## 2016-11-25 DIAGNOSIS — C3490 Malignant neoplasm of unspecified part of unspecified bronchus or lung: Secondary | ICD-10-CM

## 2016-11-25 HISTORY — DX: Malignant neoplasm of unspecified part of unspecified bronchus or lung: C34.90

## 2016-11-25 HISTORY — PX: ELECTROMAGNETIC NAVIGATION BROCHOSCOPY: SHX5369

## 2016-11-25 LAB — COMPREHENSIVE METABOLIC PANEL
ALT: 38 U/L (ref 14–54)
AST: 78 U/L — ABNORMAL HIGH (ref 15–41)
Albumin: 4.3 g/dL (ref 3.5–5.0)
Alkaline Phosphatase: 82 U/L (ref 38–126)
Anion gap: 10 (ref 5–15)
BUN: <5 mg/dL — ABNORMAL LOW (ref 6–20)
CO2: 29 mmol/L (ref 22–32)
Calcium: 10 mg/dL (ref 8.9–10.3)
Chloride: 100 mmol/L — ABNORMAL LOW (ref 101–111)
Creatinine, Ser: 0.82 mg/dL (ref 0.44–1.00)
GFR calc Af Amer: >60 mL/min (ref >60)
GFR calc non Af Amer: >60 mL/min (ref >60)
Glucose, Bld: 165 mg/dL — ABNORMAL HIGH (ref 65–99)
Potassium: 3.4 mmol/L — ABNORMAL LOW (ref 3.5–5.1)
Sodium: 139 mmol/L (ref 135–145)
Total Bilirubin: 1 mg/dL (ref 0.3–1.2)
Total Protein: 7.7 g/dL (ref 6.5–8.1)

## 2016-11-25 LAB — CBC
HCT: 40.4 % (ref 35.0–47.0)
Hemoglobin: 13.9 g/dL (ref 12.0–16.0)
MCH: 35.4 pg — ABNORMAL HIGH (ref 26.0–34.0)
MCHC: 34.4 g/dL (ref 32.0–36.0)
MCV: 102.8 fL — ABNORMAL HIGH (ref 80.0–100.0)
Platelets: 108 10*3/uL — ABNORMAL LOW (ref 150–440)
RBC: 3.93 MIL/uL (ref 3.80–5.20)
RDW: 17.8 % — ABNORMAL HIGH (ref 11.5–14.5)
WBC: 5.1 10*3/uL (ref 3.6–11.0)

## 2016-11-25 LAB — GLUCOSE, CAPILLARY: Glucose-Capillary: 180 mg/dL — ABNORMAL HIGH (ref 65–99)

## 2016-11-25 SURGERY — ELECTROMAGNETIC NAVIGATION BRONCHOSCOPY
Anesthesia: General

## 2016-11-25 MED ORDER — ROCURONIUM BROMIDE 50 MG/5ML IV SOLN
INTRAVENOUS | Status: AC
  Filled 2016-11-25: qty 1

## 2016-11-25 MED ORDER — SODIUM CHLORIDE 0.9 % IV SOLN
INTRAVENOUS | Status: DC
  Administered 2016-11-25: 13:00:00 via INTRAVENOUS

## 2016-11-25 MED ORDER — PROPOFOL 10 MG/ML IV BOLUS
INTRAVENOUS | Status: AC
  Filled 2016-11-25: qty 20

## 2016-11-25 MED ORDER — FENTANYL CITRATE (PF) 100 MCG/2ML IJ SOLN
INTRAMUSCULAR | Status: AC
  Filled 2016-11-25: qty 2

## 2016-11-25 MED ORDER — ROCURONIUM BROMIDE 100 MG/10ML IV SOLN
INTRAVENOUS | Status: DC | PRN
  Administered 2016-11-25: 10 mg via INTRAVENOUS
  Administered 2016-11-25: 15 mg via INTRAVENOUS
  Administered 2016-11-25: 5 mg via INTRAVENOUS

## 2016-11-25 MED ORDER — LIDOCAINE HCL (CARDIAC) 20 MG/ML IV SOLN
INTRAVENOUS | Status: DC | PRN
  Administered 2016-11-25: 60 mg via INTRAVENOUS

## 2016-11-25 MED ORDER — ONDANSETRON HCL 4 MG/2ML IJ SOLN
INTRAMUSCULAR | Status: AC
  Filled 2016-11-25: qty 2

## 2016-11-25 MED ORDER — SUGAMMADEX SODIUM 500 MG/5ML IV SOLN
INTRAVENOUS | Status: AC
  Filled 2016-11-25: qty 5

## 2016-11-25 MED ORDER — SUGAMMADEX SODIUM 200 MG/2ML IV SOLN
INTRAVENOUS | Status: DC | PRN
  Administered 2016-11-25: 160 mg via INTRAVENOUS

## 2016-11-25 MED ORDER — ONDANSETRON HCL 4 MG/2ML IJ SOLN: 4.0000 mg | Freq: Once | INTRAMUSCULAR | Status: DC | PRN

## 2016-11-25 MED ORDER — MIDAZOLAM HCL 2 MG/2ML IJ SOLN
INTRAMUSCULAR | Status: AC
  Filled 2016-11-25: qty 2

## 2016-11-25 MED ORDER — FENTANYL CITRATE (PF) 100 MCG/2ML IJ SOLN
25.0000 ug | INTRAMUSCULAR | Status: DC | PRN
  Administered 2016-11-25: 50 ug via INTRAVENOUS

## 2016-11-25 MED ORDER — FENTANYL CITRATE (PF) 100 MCG/2ML IJ SOLN
INTRAMUSCULAR | Status: DC | PRN
Start: 2016-11-25 — End: 2016-11-25
  Administered 2016-11-25: 100 ug via INTRAVENOUS

## 2016-11-25 MED ORDER — DEXAMETHASONE SODIUM PHOSPHATE 10 MG/ML IJ SOLN
INTRAMUSCULAR | Status: DC | PRN
  Administered 2016-11-25: 5 mg via INTRAVENOUS

## 2016-11-25 MED ORDER — DEXAMETHASONE SODIUM PHOSPHATE 10 MG/ML IJ SOLN
INTRAMUSCULAR | Status: AC
  Filled 2016-11-25: qty 1

## 2016-11-25 MED ORDER — SUCCINYLCHOLINE CHLORIDE 20 MG/ML IJ SOLN
INTRAMUSCULAR | Status: DC | PRN
  Administered 2016-11-25: 100 mg via INTRAVENOUS

## 2016-11-25 MED ORDER — SUCCINYLCHOLINE CHLORIDE 20 MG/ML IJ SOLN
INTRAMUSCULAR | Status: AC
  Filled 2016-11-25: qty 1

## 2016-11-25 MED ORDER — MIDAZOLAM HCL 5 MG/5ML IJ SOLN
INTRAMUSCULAR | Status: DC | PRN
  Administered 2016-11-25: 1 mg via INTRAVENOUS

## 2016-11-25 MED ORDER — PROPOFOL 10 MG/ML IV BOLUS
INTRAVENOUS | Status: DC | PRN
  Administered 2016-11-25: 150 mg via INTRAVENOUS

## 2016-11-25 MED ORDER — ONDANSETRON HCL 4 MG/2ML IJ SOLN
INTRAMUSCULAR | Status: DC | PRN
  Administered 2016-11-25: 4 mg via INTRAVENOUS

## 2016-11-25 MED ORDER — FENTANYL CITRATE (PF) 100 MCG/2ML IJ SOLN
INTRAMUSCULAR | Status: AC
  Administered 2016-11-25: 50 ug via INTRAVENOUS
  Filled 2016-11-25: qty 2

## 2016-11-25 NOTE — H&P (View-Only) (Signed)
PULMONARY CONSULT NOTE  Requesting MD/Service: Janese Banks Date of initial consultation: 11/11/16 Reason for consultation: Lung nodule  PT PROFILE: 56 y.o. female smoker with history of cirrhosis (alcoholic, HCV) and new finding of RUL lung nodule on LDCT and subsequent studies noted below  DATA: 08/03/16 LDCT chest: 11.6 mm spiculated right lung nodule just anterior to the major fissure with associated fissure all retraction. Lung-RADS Category 4A, suspicious. Follow up low-dose chest CT without contrast in 3 months recommended. Alternatively, PET may be considered when there is a solid component 49mm or larger. Other bilateral pulmonary nodules evident. Attention on follow-up. 08/10/16 PFTs: Spirometry suggests mild obstruction but FEV1 is preserved at 3.18 L (105% predicted). FEV1/FVC 65%. Lung volumes normal. Diffusion capacity mildly reduced at 71% predicted. 08/11/16 PET scan: The right upper lobe pulmonary nodule is only mildly metabolic, with an SUV of 1.8. Morphologically this lesion has a concerning appearance and I am still suspicious for the possibility of low-grade adenocarcinoma. 2. Sub solid nodules in both upper lobes and the smaller 7 by 6 mm solid nodule in the left upper lobe are not metabolic but require surveillance for low-grade malignancy. The patient has a known 1.7 cm enhancing mass of the right kidney upper pole. The appearance is highly suspicious for renal cell carcinoma. No current findings of hypermetabolic metastatic adenopathy. CT chest 11/01/16: RUL nodule minimally larger and worrisome for bronchogenic carcinoma. Additional scattered pulmonary nodules stable since 08/03/16.  HPI:  As above, patient is referred for consideration of bronchoscopic biopsy of right upper lobe nodule. She initially underwent lung cancer screening with LD CT in May of this year. Findings are noted above. Further evaluation has included PET scan and repeat CT chest. Again, findings are noted above.  She has a history of alcoholic/HCV cirrhosis. She no longer drinks. She has been treated for HCV. She continues to smoke 1 pack of cigarettes per day. She denies significant respiratory or pulmonary complaints. She has minimal cough, nonproductive. She denies chest pain, hemoptysis. She has chronic ankle and pedal edema. She denies calf tenderness. She bruises easily. However, she has no documented coagulopathy. She denies unexplained weight loss. She has no sweats or fever.  She has seen Dr. Genevive Bi for consideration of resection and it is his recommendation that she undergo bronchoscopic biopsy first  Past Medical History:  Diagnosis Date  . Alcohol abuse   . Alcoholic cirrhosis of liver without ascites ([place])   . Anemia   . Anxiety   . Arthropathy   . Back pain   . Bronchitis   . BRONCHITIS, ACUTE 11/01/2009   Qualifier: Diagnosis of  By: Alveta Heimlich MD, Cornelia Copa    . Chronic hepatitis C ([place])   . Chronic LBP 12/25/2014   Overview:  Post extensive surgery   . Cirrhosis ([place])   . DDD (degenerative disc disease), lumbar 01/01/2015  . Depression   . Diabetes mellitus without complication ([place])   . Diverticulosis   . Edema   . Fatigue   . Fibromyalgia   . GERD (gastroesophageal reflux disease)    gastroparesis  . High risk medications (not anticoagulants) long-term use   . Hyperglycemia   . Hypertension   . Kidney mass    11/17 small mass-  . Left leg pain 01/01/2015  . Lumbago   . Lumbar radicular pain 01/01/2015  . Muscle spasm   . Neck pain 01/01/2015  . Polyarthritis   . Reflux   . Restless leg syndrome    01/2016  . RLS (  restless legs syndrome)   . Sacroiliac pain 01/01/2015  . Shoulder pain   . Sinusitis   . SOB (shortness of breath)   . Spine disorder   . Stress headaches   . Upper back pain 01/01/2015  . Urinary frequency   . Vitamin D deficiency disease     Past Surgical History:  Procedure Laterality Date  . BACK SURGERY  12/19/2011  . BACK SURGERY    . COLONOSCOPY WITH  PROPOFOL N/A 09/25/2015   Procedure: COLONOSCOPY WITH PROPOFOL;  Surgeon: Lollie Sails, MD;  Location: [facility] ENDOSCOPY;  Service: Endoscopy;  Laterality: N/A;  . COLONOSCOPY WITH PROPOFOL N/A 04/01/2016   Procedure: COLONOSCOPY WITH PROPOFOL;  Surgeon: Lollie Sails, MD;  Location: [facility] ENDOSCOPY;  Service: Endoscopy;  Laterality: N/A;  . ESOPHAGOGASTRODUODENOSCOPY N/A 04/14/2015   Procedure: ESOPHAGOGASTRODUODENOSCOPY (EGD);  Surgeon: Lollie Sails, MD;  Location: [facility] ENDOSCOPY;  Service: Endoscopy;  Laterality: N/A;  . ESOPHAGOGASTRODUODENOSCOPY (EGD) WITH PROPOFOL N/A 09/16/2015   Procedure: ESOPHAGOGASTRODUODENOSCOPY (EGD) WITH PROPOFOL;  Surgeon: Lollie Sails, MD;  Location: [facility] ENDOSCOPY;  Service: Endoscopy;  Laterality: N/A;  . ESOPHAGOGASTRODUODENOSCOPY (EGD) WITH PROPOFOL N/A 01/22/2016   Procedure: ESOPHAGOGASTRODUODENOSCOPY (EGD) WITH PROPOFOL;  Surgeon: [name], MD;  Location: [place];  Service: Endoscopy;  Laterality: N/A;  . ESOPHAGOGASTRODUODENOSCOPY (EGD) WITH PROPOFOL N/A 04/01/2016   Procedure: ESOPHAGOGASTRODUODENOSCOPY (EGD) WITH PROPOFOL;  Surgeon: Lollie Sails, MD;  Location: [facility] ENDOSCOPY;  Service: Endoscopy;  Laterality: N/A;  . HERNIA REPAIR N/A 10/2015   abdominal   . TONSILLECTOMY    . TUBAL LIGATION    . UMBILICAL HERNIA REPAIR N/A 11/06/2015   Procedure: HERNIA REPAIR UMBILICAL ADULT;  Surgeon: [name], MD;  Location: [facility] ORS;  Service: General;  Laterality: N/A;    MEDICATIONS: I have reviewed all medications and confirmed regimen as documented  Social History   Social History  . Marital status: Married    Spouse name: N/A  . Number of children: N/A  . Years of education: N/A   Occupational History  . Not on file.   Social History Main Topics  . Smoking status: Current Every Day Smoker    Packs/day: 1.00    Years: 42.00    Types: Cigarettes  . Smokeless tobacco: Never Used     Comment: 2ppd until last 2  years  . Alcohol use No     Comment: stopped drinking 05/12/15  . Drug use: No  . Sexual activity: Not on file   Other Topics Concern  . Not on file   Social History Narrative   Lives at home with husband    Family History  Problem Relation Age of Onset  . Stroke Mother   . Heart disease Mother   . [name] Father   . Breast cancer Sister 46  . Kidney disease Neg Hx   . Bladder Cancer Neg Hx   . Kidney cancer Neg Hx   . Prostate cancer Neg Hx     ROS: No fever, myalgias/arthralgias, unexplained weight loss or weight gain No new focal weakness or sensory deficits No otalgia, hearing loss, visual changes, nasal and sinus symptoms, mouth and throat problems No neck pain or adenopathy No abdominal pain, N/V/D, diarrhea, change in bowel pattern No dysuria, change in urinary pattern   Vitals:   11/11/16 1619 11/11/16 1624  BP:  122/80  Pulse:  96  SpO2:  91%  Weight: 176 lb (79.8 kg)   Height: 5\' 10"  (1.778 m)  EXAM:  Gen: Appears somewhat older than stated age, No overt respiratory distress HEENT: NCAT, sclera white, oropharynx normal Neck: Supple without LAN, thyromegaly, JVD Lungs: breath sounds full with normal percussion note and no adventitious sounds Cardiovascular: RRR, no murmurs noted Abdomen: Soft, nontender, normal BS Ext: Multiple ecchymoses on all extremities, clubbing or cyanosis. Symmetric ankle and pedal edema Neuro: CNs grossly intact, motor and sensory intact  DATA:   BMP Latest Ref Rng & Units 04/12/2016 01/22/2016 01/21/2016  Glucose 65 - 99 mg/dL 101(H) 106(H) 113(H)  BUN 6 - 20 mg/dL 7 8 14   Creatinine 0.44 - 1.00 mg/dL 0.69 0.54 0.66  Sodium 135 - 145 mmol/L 130(L) 135 131(L)  Potassium 3.5 - 5.1 mmol/L 3.4(L) 4.2 4.1  Chloride 101 - 111 mmol/L 97(L) 106 101  CO2 22 - 32 mmol/L 26 23 22   Calcium 8.9 - 10.3 mg/dL 9.3 8.2(L) 8.4(L)    CBC Latest Ref Rng & Units 10/21/2016 07/29/2016 07/08/2016  WBC 3.6 - 11.0 K/uL 6.3 7.6 6.7   Hemoglobin 12.0 - 16.0 g/dL 11.5(L) 12.6 10.5(L)  Hematocrit 35.0 - 47.0 % 33.6(L) 36.7 31.3(L)  Platelets 150 - 440 K/uL 147(L) 147(L) 224    CXR:  No recent films  IMPRESSION:     ICD-10-CM   1. Smoker F17.200   2. Cirrhosis of liver without ascites, unspecified hepatic cirrhosis type (HCC) K74.60 CBC    Protime-INR  3. Lung nodule R91.1 CBC    Protime-INR   The lung nodule has a CT appearance consistent with malignancy. On PET scan SUV score is low consistent with low-grade malignancy. There is a right kidney lesion which appears to have remained stable since November 2017. However reports raise concern for RCC. It's possible that the lung nodule could represent metastasis.  PLAN:  A navigation bronchoscopy has been arranged for next week to be performed by Dr. Mortimer Fries  CBC, prothrombin time to be performed prior to the procedure  Follow-up will be arranged as needed   Merton Border, MD PCCM service Mobile [phone] Pager [phone] 11/14/2016 3:48 PM

## 2016-11-25 NOTE — Anesthesia Post-op Follow-up Note (Signed)
Anesthesia QCDR form completed.        

## 2016-11-25 NOTE — Op Note (Signed)
Electromagnetic Navigation Bronchoscopy: Indication: lung nodule  Preoperative Diagnosis:RUL lung nodule Post Procedure Diagnosis:RUL lung nodule Consent: Verbal/Written  The Risks and Benefits of the procedure explained to patient/family prior to start of procedure and I have discussed the risk for acute bleeding, increased chance of infection, increased chance of respiratory failure and cardiac arrest and death.  I have also explained to avoid all types of NSAIDs to decrease chance of bleeding, and to avoid food and drinks the midnight prior to procedure.  The procedure consists of a video camera with a light source to be placed and inserted  into the lungs to  look for abnormal tissue and to obtain tissue samples by using needle and biopsy tools.  The patient/family understand the risks and benefits and have agreed to proceed with procedure.   Hand washing performed prior to starting the procedure.   Type of Anesthesia: see Anesthesiology records .   Procedure Performed:  Virtual Bronchoscopy with Multi-planar Image analysis, 3-D reconstruction of coronal, sagittal and multi-planar images for the purposes of planning real-time bronchoscopy using the iLogic Electromagnetic Navigation Bronchoscopy System (superDimension).  Description of Procedure: After obtaining informed consent from the patient, the above sedative and anesthetic measures were carried out, flexible fiberoptic bronchoscope was inserted via Endotracheal tube after patient was intubated by CNA/Anesthesiologist.   The virtual camera was then placed into the central portion of the trachea. The trachea itself was inspected.  The main carina, right and left midstem bronchus and all the segmental and subsegmental airways by virtual bronchoscopy were inspected. The camera was directed to standard registration points at the following centers: main carina, right upper lobe bronchus, right lower lobe bronchus, right middle lobe  bronchus, left upper lobe bronchus, and the left lower lobe bronchus. This data was transferred to the i-Logic ENB system for real-time bronchoscopy.   The scope was then navigated to the RUL for tissue sampling  Specimans Obtained:  Transbronchial Fine Needle Aspirations 21G times:4  Transbronchial Forceps Biopsy times:7  Transbronchial Single Needle Brush:2  Fluoroscopy:  Fluoroscopy was utilized during the course of this procedure to assure that biopsies were taken in a safe manner under fluoroscopic guidance with spot films required.   Complications:None  Estimated Blood Loss: minimal approx 1cc  Monitoring:  The patient was monitored with continuous oximetry and received supplemental nasal cannula oxygen throughout the procedure. In addition, serial blood pressure measurements and continuous electrocardiography showed these physiologic parameters to remain tolerable throughout the procedure.   Assessment and Plan/Additional Comments: Follow up Pathology Reports    Corrin Parker, M.D.  [facility]  Medical Director [place] Director [facility] Cardio-Pulmonary Department

## 2016-11-25 NOTE — Anesthesia Procedure Notes (Signed)
Procedure Name: Intubation Date/Time: 11/25/2016 1:40 PM Performed by: Dionne Bucy Pre-anesthesia Checklist: Patient identified, Patient being monitored, Timeout performed, Emergency Drugs available and Suction available Patient Re-evaluated:Patient Re-evaluated prior to induction Oxygen Delivery Method: Circle system utilized Preoxygenation: Pre-oxygenation with 100% oxygen Induction Type: IV induction Ventilation: Mask ventilation without difficulty Laryngoscope Size: Mac and 3 Grade View: Grade I Tube type: Oral Tube size: 8.0 mm Number of attempts: 1 Airway Equipment and Method: Stylet Placement Confirmation: ETT inserted through vocal cords under direct vision,  positive ETCO2 and breath sounds checked- equal and bilateral Secured at: 21 cm Tube secured with: Tape Dental Injury: Teeth and Oropharynx as per pre-operative assessment

## 2016-11-25 NOTE — Anesthesia Postprocedure Evaluation (Signed)
Anesthesia Post Note  Patient: Audrey Garrett  Procedure(s) Performed: Procedure(s) (LRB): ELECTROMAGNETIC NAVIGATION BRONCHOSCOPY (N/A)  Patient location during evaluation: PACU Anesthesia Type: General Level of consciousness: awake Pain management: pain level controlled Vital Signs Assessment: post-procedure vital signs reviewed and stable Respiratory status: spontaneous breathing Cardiovascular status: blood pressure returned to baseline Anesthetic complications: no     Last Vitals:  Vitals:   11/25/16 1445 11/25/16 1453  BP: 114/60   Pulse: 80 85  Resp: 15 15  Temp: 37.1 C   SpO2: 98% 100%    Last Pain:  Vitals:   11/25/16 1453  TempSrc:   PainSc: 10-Worst pain ever                 VAN STAVEREN,Benedicto Capozzi

## 2016-11-25 NOTE — Transfer of Care (Signed)
Immediate Anesthesia Transfer of Care Note  Patient: Audrey Garrett  Procedure(s) Performed: Procedure(s): ELECTROMAGNETIC NAVIGATION BRONCHOSCOPY (N/A)  Patient Location: PACU  Anesthesia Type:General  Level of Consciousness: sedated  Airway & Oxygen Therapy: Patient Spontanous Breathing and Patient connected to face mask oxygen  Post-op Assessment: Report given to RN and Post -op Vital signs reviewed and stable  Post vital signs: Reviewed and stable  Last Vitals:  Vitals:   11/25/16 1219 11/25/16 1445  BP: 121/73 114/60  Pulse: (!) 107 80  Resp: 18 15  Temp: 37.1 C 37.1 C  SpO2: 96% 98%    Last Pain:  Vitals:   11/25/16 1445  TempSrc:   PainSc: Asleep         Complications: No apparent anesthesia complications

## 2016-11-25 NOTE — Interval H&P Note (Signed)
History and Physical Interval Note:  11/25/2016 1:22 PM  Audrey Garrett  has presented today for surgery, with the diagnosis of lung nodule  The various methods of treatment have been discussed with the patient and family. After consideration of risks, benefits and other options for treatment, the patient has consented to  Procedure(s): ELECTROMAGNETIC NAVIGATION BRONCHOSCOPY (N/A) as a surgical intervention .  The patient's history has been reviewed, patient examined, no change in status, stable for surgery.  I have reviewed the patient's chart and labs.  Questions were answered to the patient's satisfaction.     Flora Lipps

## 2016-11-25 NOTE — Progress Notes (Signed)
No bloody sputum noted and very little coughing

## 2016-11-25 NOTE — Anesthesia Preprocedure Evaluation (Signed)
Anesthesia Evaluation  Patient identified by MRN, date of birth, ID band Patient awake    Reviewed: Allergy & Precautions, NPO status , [name] Chart, lab work & pertinent test results  History of Anesthesia Complications Negative for: history of anesthetic complications  Airway Mallampati: II  TM Distance: >3 FB Neck ROM: Full    Dental  (+) Upper Dentures, Lower Dentures   Pulmonary neg sleep apnea, neg COPD, Current Smoker,    breath sounds clear to auscultation- rhonchi (-) wheezing      Cardiovascular Exercise Tolerance: Good hypertension, Pt. on medications (-) CAD and (-) Past MI  Rhythm:Regular Rate:Normal - Systolic murmurs and - Diastolic murmurs    Neuro/Psych negative neurological ROS     GI/Hepatic GERD  ,(+) Cirrhosis       , Hepatitis -, C  Endo/Other  diabetes, Type 2, Oral Hypoglycemic Agents  Renal/GU Renal disease: hx of nephrolithiasis.     Musculoskeletal  (+) Arthritis , Fibromyalgia -  Abdominal (+) - obese,   Peds  Hematology  (+) anemia ,   Anesthesia Other Findings Past Medical History: No date: Alcohol abuse No date: Alcoholic cirrhosis of liver without ascites (* No date: Anemia No date: Anxiety No date: Arthropathy No date: Back pain No date: Bronchitis 11/01/2009: BRONCHITIS, ACUTE     Comment: Qualifier: Diagnosis of  By: Alveta Heimlich MD, Cornelia Copa   No date: Chronic hepatitis C ([place]) 12/25/2014: Chronic LBP     Comment: Overview:  Post extensive surgery  No date: Cirrhosis ([place]) 01/01/2015: DDD (degenerative disc disease), lumbar No date: Depression No date: Diabetes mellitus without complication (HCC) No date: Diverticulosis No date: Edema No date: Fatigue No date: Fibromyalgia No date: GERD (gastroesophageal reflux disease)     Comment: gastroparesis No date: High risk medications (not anticoagulants) lon* No date: Hyperglycemia No date: Hypertension No date: Kidney mass    Comment: 11/17 small mass- 01/01/2015: Left leg pain No date: Lumbago 01/01/2015: Lumbar radicular pain No date: Muscle spasm 01/01/2015: Neck pain No date: Polyarthritis No date: Reflux No date: Restless leg syndrome     Comment: 01/2016 No date: RLS (restless legs syndrome) 01/01/2015: Sacroiliac pain No date: Shoulder pain No date: Sinusitis No date: SOB (shortness of breath) No date: Spine disorder No date: Stress headaches 01/01/2015: Upper back pain No date: Urinary frequency No date: Vitamin D deficiency disease   Reproductive/Obstetrics                             Anesthesia Physical  Anesthesia Plan  ASA: III  Anesthesia Plan: General   Post-op Pain Management:    Induction: Intravenous  PONV Risk Score and Plan:   Airway Management Planned: Oral ETT  Additional Equipment:   Intra-op Plan:   Post-operative Plan: Extubation in OR  Informed Consent: I have reviewed the patients History and Physical, chart, labs and discussed the procedure including the risks, benefits and alternatives for the proposed anesthesia with the patient or authorized representative who has indicated his/her understanding and acceptance.   Dental advisory given  Plan Discussed with: CRNA and Anesthesiologist  Anesthesia Plan Comments:         Anesthesia Quick Evaluation

## 2016-11-26 ENCOUNTER — Other Ambulatory Visit: Payer: Self-pay | Admitting: Pathology

## 2016-11-26 ENCOUNTER — Telehealth: Payer: Self-pay | Admitting: Pulmonary Disease

## 2016-11-26 LAB — CYTOLOGY - NON PAP

## 2016-11-26 LAB — SURGICAL PATHOLOGY

## 2016-11-26 NOTE — Telephone Encounter (Signed)
Dr. Luana Shu called about the ENB that was done on this pt that was ordered by Dr. Alva Garnet.  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  Will forward to DS to make him aware.

## 2016-12-02 ENCOUNTER — Inpatient Hospital Stay: Payer: Medicare Other | Attending: Oncology | Admitting: Oncology

## 2016-12-02 ENCOUNTER — Encounter: Payer: Self-pay | Admitting: *Deleted

## 2016-12-02 ENCOUNTER — Ambulatory Visit
Admission: RE | Admit: 2016-12-02 | Discharge: 2016-12-02 | Disposition: A | Discharge status: Home / Self Care | Payer: Medicare Other | Source: Ambulatory Visit | Attending: Radiation Oncology | Admitting: Radiation Oncology

## 2016-12-02 ENCOUNTER — Encounter: Payer: Self-pay | Admitting: Oncology

## 2016-12-02 VITALS — BP 115/75 | HR 80 | Temp 97.9°F | Resp 18 | Wt 183.2 lb

## 2016-12-02 DIAGNOSIS — F419 Anxiety disorder, unspecified: Secondary | ICD-10-CM | POA: Diagnosis not present

## 2016-12-02 DIAGNOSIS — R0602 Shortness of breath: Secondary | ICD-10-CM | POA: Insufficient documentation

## 2016-12-02 DIAGNOSIS — F17210 Nicotine dependence, cigarettes, uncomplicated: Secondary | ICD-10-CM | POA: Insufficient documentation

## 2016-12-02 DIAGNOSIS — G2581 Restless legs syndrome: Secondary | ICD-10-CM | POA: Diagnosis not present

## 2016-12-02 DIAGNOSIS — E559 Vitamin D deficiency, unspecified: Secondary | ICD-10-CM | POA: Insufficient documentation

## 2016-12-02 DIAGNOSIS — K259 Gastric ulcer, unspecified as acute or chronic, without hemorrhage or perforation: Secondary | ICD-10-CM | POA: Insufficient documentation

## 2016-12-02 DIAGNOSIS — R188 Other ascites: Secondary | ICD-10-CM | POA: Diagnosis not present

## 2016-12-02 DIAGNOSIS — E119 Type 2 diabetes mellitus without complications: Secondary | ICD-10-CM | POA: Insufficient documentation

## 2016-12-02 DIAGNOSIS — K7030 Alcoholic cirrhosis of liver without ascites: Secondary | ICD-10-CM | POA: Insufficient documentation

## 2016-12-02 DIAGNOSIS — R350 Frequency of micturition: Secondary | ICD-10-CM | POA: Insufficient documentation

## 2016-12-02 DIAGNOSIS — M1990 Unspecified osteoarthritis, unspecified site: Secondary | ICD-10-CM | POA: Insufficient documentation

## 2016-12-02 DIAGNOSIS — C3411 Malignant neoplasm of upper lobe, right bronchus or lung: Secondary | ICD-10-CM | POA: Insufficient documentation

## 2016-12-02 DIAGNOSIS — F329 Major depressive disorder, single episode, unspecified: Secondary | ICD-10-CM | POA: Insufficient documentation

## 2016-12-02 DIAGNOSIS — M5136 Other intervertebral disc degeneration, lumbar region: Secondary | ICD-10-CM | POA: Insufficient documentation

## 2016-12-02 DIAGNOSIS — F1010 Alcohol abuse, uncomplicated: Secondary | ICD-10-CM | POA: Insufficient documentation

## 2016-12-02 DIAGNOSIS — M542 Cervicalgia: Secondary | ICD-10-CM | POA: Insufficient documentation

## 2016-12-02 DIAGNOSIS — M797 Fibromyalgia: Secondary | ICD-10-CM | POA: Diagnosis not present

## 2016-12-02 DIAGNOSIS — D509 Iron deficiency anemia, unspecified: Secondary | ICD-10-CM | POA: Insufficient documentation

## 2016-12-02 DIAGNOSIS — I10 Essential (primary) hypertension: Secondary | ICD-10-CM | POA: Insufficient documentation

## 2016-12-02 DIAGNOSIS — G8929 Other chronic pain: Secondary | ICD-10-CM | POA: Insufficient documentation

## 2016-12-02 DIAGNOSIS — B182 Chronic viral hepatitis C: Secondary | ICD-10-CM | POA: Insufficient documentation

## 2016-12-02 DIAGNOSIS — D500 Iron deficiency anemia secondary to blood loss (chronic): Secondary | ICD-10-CM | POA: Diagnosis not present

## 2016-12-02 DIAGNOSIS — Z79899 Other long term (current) drug therapy: Secondary | ICD-10-CM | POA: Diagnosis not present

## 2016-12-02 DIAGNOSIS — K219 Gastro-esophageal reflux disease without esophagitis: Secondary | ICD-10-CM | POA: Insufficient documentation

## 2016-12-02 DIAGNOSIS — C649 Malignant neoplasm of unspecified kidney, except renal pelvis: Secondary | ICD-10-CM | POA: Diagnosis not present

## 2016-12-02 DIAGNOSIS — Z510 Encounter for antineoplastic radiation therapy: Secondary | ICD-10-CM | POA: Insufficient documentation

## 2016-12-02 DIAGNOSIS — M5116 Intervertebral disc disorders with radiculopathy, lumbar region: Secondary | ICD-10-CM | POA: Diagnosis not present

## 2016-12-02 DIAGNOSIS — M549 Dorsalgia, unspecified: Secondary | ICD-10-CM | POA: Insufficient documentation

## 2016-12-02 DIAGNOSIS — R5383 Other fatigue: Secondary | ICD-10-CM | POA: Insufficient documentation

## 2016-12-02 DIAGNOSIS — Z803 Family history of malignant neoplasm of breast: Secondary | ICD-10-CM | POA: Insufficient documentation

## 2016-12-02 NOTE — Progress Notes (Signed)
Here for follow up. Stated last few days has had nausea ,yesterday vomited x 1. Loose stools x 2 d per pt. Appears anxious and tremulous.

## 2016-12-02 NOTE — Progress Notes (Signed)
Hematology/Oncology Consult note [facility]  Telephone:([phone] [phone]  Patient Care Team: Jodi Marble, MD as PCP - General (Internal Medicine)   Name of the patient: Audrey Garrett  [id number]  [birth year]   Date of visit: 12/02/16  Diagnosis- 1. Iron deficiency anemia  2. Renal cell carcinoma  3. Stage I lung cancer [id number]  Chief complaint/ Reason for visit- routine f/u  Heme/Onc history: Patient is a 56 year old female who was last seen by Dr. Alvy Bimler in jan 2018 for [place]. Patient has liver cirrhosis and is currently receiving Harvoni for hepatitis C. GI evaluation is as follows:  Upper EGD on 04/01/16 showed multiple dispersed diminutive erosions were found in the gastric antrum. There were no stigmata of recent bleeding. One healing cratered gastric ulcer with no stigmata of bleeding was found in the prepyloric region of the stomach. The lesion was 2 mm in largest dimension. Colonoscopy on 04/01/16 showed multiple small-mouthed diverticula were found in the sigmoid colon and descending colon. A single small localized angioectasia with typical arborization was found in the proximal ascending colon. Coagulation for tissue destruction using argon plasma at 0.5 liters/minute and 20 watts was successful.  She has received significant blood transfusions recently since March 2017. Her last blood transfusion was on 03/17/16. She had 2 units of blood She received 4 doses of IV Venofer  In Jan 2018 she received blood trasnsfusion followed by ferraheme.   During her visit in February 2018 her H&H was 10.3/ 31.6. IV iron was held off and she was supposed to get cbc checked in1 month. Patient missed that appointment and became progressively iron deficient again when her hb dropped to 7.9. She received 1 unit of blood transfusion  Patient continued to feel fatigued despite improvement in her anemia. Given her strong h/o smoking CT lung cancer  screenign was obtained which showed: IMPRESSION: 11.6 mm spiculated right lung nodule just anterior to the major fissure with associated fissure all retraction. Lung-RADS Category 4A, suspicious. Follow up low-dose chest CT without contrast in 3 months (please use the following order, "CT CHEST LCS NODULE FOLLOW-UP W/O CM") is recommended. Alternatively, PET may be considered when there is a solid component 80mm or larger.  Other bilateral pulmonary nodules evident. Attention on follow-up.   PET/CT showed mild hypermetabolism in RUL nodule of 1.8  Patient underwent bronchoscopy guided biopsy of RUL lesion which showed adenocarcinoma  Interval history- energy levels are better. Leg swelling has improved. She has not needed asctic tap recently  ECOG PS- 1 Pain scale- 7- chronic back pain   Review of systems- Review of Systems  Constitutional: Positive for malaise/fatigue. Negative for chills, fever and weight loss.  HENT: Negative for congestion, ear discharge and nosebleeds.   Eyes: Negative for blurred vision.  Respiratory: Negative for cough, hemoptysis, sputum production, shortness of breath and wheezing.   Cardiovascular: Negative for chest pain, palpitations, orthopnea and claudication.  Gastrointestinal: Negative for abdominal pain, blood in stool, constipation, diarrhea, heartburn, melena, nausea and vomiting.  Genitourinary: Negative for dysuria, flank pain, frequency, hematuria and urgency.  Musculoskeletal: Positive for back pain. Negative for joint pain and myalgias.  Skin: Negative for rash.  Neurological: Negative for dizziness, tingling, focal weakness, seizures, weakness and headaches.  Endo/Heme/Allergies: Does not bruise/bleed easily.  Psychiatric/Behavioral: Negative for depression and suicidal ideas. The patient does not have insomnia.       No Known Allergies   Past Medical History:  Diagnosis Date  .  Alcohol abuse   . Alcoholic cirrhosis of liver  without ascites ([place])   . Anemia   . Anxiety   . Arthropathy   . Back pain   . Bronchitis   . BRONCHITIS, ACUTE 11/01/2009   Qualifier: Diagnosis of  By: Alveta Heimlich MD, Cornelia Copa    . Chronic hepatitis C ([place])   . Chronic LBP 12/25/2014   Overview:  Post extensive surgery   . Cirrhosis ([place])   . DDD (degenerative disc disease), lumbar 01/01/2015  . Depression   . Diabetes mellitus without complication ([place])   . Diverticulosis   . Edema   . Fatigue   . Fibromyalgia   . GERD (gastroesophageal reflux disease)    gastroparesis  . High risk medications (not anticoagulants) long-term use   . Hyperglycemia   . Hypertension   . Kidney mass    11/17 small mass-  . Left leg pain 01/01/2015  . Lumbago   . Lumbar radicular pain 01/01/2015  . Muscle spasm   . Neck pain 01/01/2015  . Polyarthritis   . Reflux   . Restless leg syndrome    01/2016  . RLS (restless legs syndrome)   . Sacroiliac pain 01/01/2015  . Shoulder pain   . Sinusitis   . SOB (shortness of breath)   . Spine disorder   . Stress headaches   . Upper back pain 01/01/2015  . Urinary frequency   . Vitamin D deficiency disease      Past Surgical History:  Procedure Laterality Date  . BACK SURGERY  12/19/2011  . BACK SURGERY    . COLONOSCOPY WITH PROPOFOL N/A 09/25/2015   Procedure: COLONOSCOPY WITH PROPOFOL;  Surgeon: Lollie Sails, MD;  Location: [facility] ENDOSCOPY;  Service: Endoscopy;  Laterality: N/A;  . COLONOSCOPY WITH PROPOFOL N/A 04/01/2016   Procedure: COLONOSCOPY WITH PROPOFOL;  Surgeon: Lollie Sails, MD;  Location: [facility] ENDOSCOPY;  Service: Endoscopy;  Laterality: N/A;  . ELECTROMAGNETIC NAVIGATION BROCHOSCOPY N/A 11/25/2016   Procedure: ELECTROMAGNETIC NAVIGATION BRONCHOSCOPY;  Surgeon: Flora Lipps, MD;  Location: [facility] ORS;  Service: Cardiopulmonary;  Laterality: N/A;  . ESOPHAGOGASTRODUODENOSCOPY N/A 04/14/2015   Procedure: ESOPHAGOGASTRODUODENOSCOPY (EGD);  Surgeon: Lollie Sails, MD;  Location: [facility]  ENDOSCOPY;  Service: Endoscopy;  Laterality: N/A;  . ESOPHAGOGASTRODUODENOSCOPY (EGD) WITH PROPOFOL N/A 09/16/2015   Procedure: ESOPHAGOGASTRODUODENOSCOPY (EGD) WITH PROPOFOL;  Surgeon: Lollie Sails, MD;  Location: [facility] ENDOSCOPY;  Service: Endoscopy;  Laterality: N/A;  . ESOPHAGOGASTRODUODENOSCOPY (EGD) WITH PROPOFOL N/A 01/22/2016   Procedure: ESOPHAGOGASTRODUODENOSCOPY (EGD) WITH PROPOFOL;  Surgeon: [name], MD;  Location: [place];  Service: Endoscopy;  Laterality: N/A;  . ESOPHAGOGASTRODUODENOSCOPY (EGD) WITH PROPOFOL N/A 04/01/2016   Procedure: ESOPHAGOGASTRODUODENOSCOPY (EGD) WITH PROPOFOL;  Surgeon: Lollie Sails, MD;  Location: [facility] ENDOSCOPY;  Service: Endoscopy;  Laterality: N/A;  . HERNIA REPAIR N/A 10/2015   abdominal   . TONSILLECTOMY    . TUBAL LIGATION    . UMBILICAL HERNIA REPAIR N/A 11/06/2015   Procedure: HERNIA REPAIR UMBILICAL ADULT;  Surgeon: [name], MD;  Location: [facility] ORS;  Service: General;  Laterality: N/A;    Social History   Social History  . Marital status: Married    Spouse name: N/A  . Number of children: N/A  . Years of education: N/A   Occupational History  . Not on file.   Social History Main Topics  . Smoking status: Current Every Day Smoker    Packs/day: 1.00    Years: 42.00    Types:  Cigarettes  . Smokeless tobacco: Never Used     Comment: 2ppd until last 2 years  . Alcohol use No     Comment: stopped drinking 05/12/15  . Drug use: No  . Sexual activity: Not on file   Other Topics Concern  . Not on file   Social History Narrative   Lives at home with husband    Family History  Problem Relation Age of Onset  . Stroke Mother   . Heart disease Mother   . [name] Father   . Breast cancer Sister 32  . Kidney disease Neg Hx   . Bladder Cancer Neg Hx   . Kidney cancer Neg Hx   . Prostate cancer Neg Hx      Current Outpatient Prescriptions:  .  furosemide (LASIX) 40 MG tablet, Take 40 mg by mouth 2  (two) times daily. , Disp: , Rfl:  .  magnesium oxide (MAG-OX) 400 MG tablet, Take 400 mg by mouth daily., Disp: , Rfl:  .  Multiple Vitamins-Minerals (CENTRUM SILVER 50+WOMEN PO), Take 1 tablet by mouth daily., Disp: , Rfl:  .  [START ON 02/16/2017] Oxycodone HCl 20 MG TABS, Take 1 tablet (20 mg total) by mouth every 6 (six) hours., Disp: 120 tablet, Rfl: 0 .  pantoprazole (PROTONIX) 40 MG tablet, take once DAILY., Disp: , Rfl: 11 .  PARoxetine (PAXIL) 40 MG tablet, Take 40 mg by mouth every morning., Disp: , Rfl: 2 .  pregabalin (LYRICA) 100 MG capsule, Take 100 mg by mouth 2 (two) times daily., Disp: , Rfl:  .  spironolactone (ALDACTONE) 50 MG tablet, TAKE 2 TABLETS BY MOUTH EVERY DAY, Disp: , Rfl:  .  thiamine (VITAMIN B-1) 100 MG tablet, Take 1 tablet (100 mg total) by mouth daily., Disp: 90 tablet, Rfl: 6 .  lactulose (CHRONULAC) 10 GM/15ML solution, Take 10 g by mouth 3 (three) times daily as needed for mild constipation or moderate constipation. , Disp: , Rfl:   Physical exam:  Vitals:   12/02/16 0949  BP: 115/75  Pulse: 80  Resp: 18  Temp: 97.9 F (36.6 C)  TempSrc: Tympanic  Weight: 183 lb 3.2 oz (83.1 kg)   Physical Exam  Constitutional: She is oriented to person, place, and time and well-developed, well-nourished, and in no distress.  HENT:  Head: Normocephalic and atraumatic.  Eyes: Pupils are equal, round, and reactive to light. EOM are normal.  Neck: Normal range of motion.  Cardiovascular: Normal rate, regular rhythm and normal heart sounds.   Pulmonary/Chest: Effort normal and breath sounds normal.  Abdominal: Soft. Bowel sounds are normal.  distended  Musculoskeletal: She exhibits edema.  Neurological: She is alert and oriented to person, place, and time.  Skin: Skin is warm and dry.     CMP Latest Ref Rng & Units 11/25/2016  Glucose 65 - 99 mg/dL 165(H)  BUN 6 - 20 mg/dL <5(L)  Creatinine 0.44 - 1.00 mg/dL 0.82  Sodium 135 - 145 mmol/L 139  Potassium 3.5 -  5.1 mmol/L 3.4(L)  Chloride 101 - 111 mmol/L 100(L)  CO2 22 - 32 mmol/L 29  Calcium 8.9 - 10.3 mg/dL 10.0  Total Protein 6.5 - 8.1 g/dL 7.7  Total Bilirubin 0.3 - 1.2 mg/dL 1.0  Alkaline Phos 38 - 126 U/L 82  AST 15 - 41 U/L 78(H)  ALT 14 - 54 U/L 38   CBC Latest Ref Rng & Units 11/25/2016  WBC 3.6 - 11.0 K/uL 5.1  Hemoglobin 12.0 - 16.0  g/dL 13.9  Hematocrit 35.0 - 47.0 % 40.4  Platelets 150 - 440 K/uL 108(L)    No images are attached to the encounter.  US Abdomen Limited  Result Date: 11/19/2016 CLINICAL DATA:  Recurrent ascites EXAM: LIMITED ABDOMEN ULTRASOUND FOR ASCITES TECHNIQUE: Limited ultrasound survey for ascites was performed in all four abdominal quadrants. COMPARISON:  None. FINDINGS: Sonographic evaluation the abdomen was performed as a prelude to possible ultrasound guided paracentesis. Only mild ascites is noted within the abdomen. No sizable pocket to allow for safe paracentesis is noted. IMPRESSION: Mild ascites without current need for paracentesis. Electronically Signed   By: Inez Catalina M.D.   On: 11/19/2016 12:13   Dg C-arm 1-60 Min-no Report  Result Date: 11/25/2016 Fluoroscopy was utilized by the requesting physician.  No radiographic interpretation.     Assessment and plan- Patient is a 56 y.o. female who sees me for following issues:  1. irond eficiency anemia- repeat cbc, ferritin and iron studies next month and decide about IV iron accordingly  2. Renal cell carcinoma- noted on CT abdomen. Not yet resected. ebing followed by urology  3. Stage I lung cancer- discussed findings of CT thorax, prior Pet and recent biopsy. She has a 1.3X1 cm RUl lesion which is now biopsy proven adenocarcinoma. Options at this time are surgical resection versus SBRT. No role for chemotherapy for Stage I luing cancer. Patient keen to proceed with radiation over surgery but would like to get Dr. Genevive Bi thoughts as well. I will discuss her case at tumor board today with DR.  Chrsytal and Dr. Genevive Bi.   She did have other subcm pulmonary nodules which were not PET AVID. Will touch base with radiology regarding timing of repeat CT.   I will see her back in 3 months after repeat CT thorax.    Visit Diagnosis 1. Iron deficiency anemia due to chronic blood loss   2. Malignant neoplasm of upper lobe of right lung [place])      Dr. Randa Evens, MD, MPH [facility] Pager- [phone] 12/02/2016 11:22 AM

## 2016-12-02 NOTE — Progress Notes (Signed)
  Oncology Nurse Navigator Documentation  Navigator Location: [facility] (12/02/16 1100)   )Navigator Encounter Type: Initial RadOnc (12/02/16 1100)     Confirmed Diagnosis Date: 11/26/16 (12/02/16 1100)               Patient Visit Type: RadOnc (12/02/16 1100) Treatment Phase: Pre-Tx/Tx Discussion (12/02/16 1100) Barriers/Navigation Needs: Coordination of Care;Education (12/02/16 1100) Education: Understanding Cancer/ Treatment Options;Newly Diagnosed Cancer Education (12/02/16 1100) Interventions: Coordination of Care;Education (12/02/16 1100)   Coordination of Care: Appts (12/02/16 1100) Education Method: Written (12/02/16 1100)      Acuity: Level 1 (12/02/16 1100) Acuity Level 1: Initial guidance, education and coordination as needed;Minimal follow up required (12/02/16 1100)    met with patient during initial rad-onc consultation. Pt has received results from recent biopsy and has questions regarding radiation treatment for her lung cancer. All questions answered at the time of visit. Pt agrees to pursue SBRT at this time. appt given to patient for simulation on 12/16/2016. All upcoming appts reviewed with the patient and family. No further questions at this time. Informed pt and family to call if has further questions. Pt and family verbalized understanding.   Time Spent with Patient: 60 (12/02/16 1100)

## 2016-12-02 NOTE — Consult Note (Signed)
NEW PATIENT EVALUATION  Name: Audrey Garrett  MRN: [id number]  Date:   12/02/2016     DOB: 09/16/1960   This 56 y.o. female patient presents to the clinic for initial evaluation of stage I adenocarcinoma of the right lung.  REFERRING PHYSICIAN: Jodi Marble, MD  CHIEF COMPLAINT: No chief complaint on file.   DIAGNOSIS: There were no encounter diagnoses.   PREVIOUS INVESTIGATIONS:  CT scans reviewed Clinical notes reviewed Pathology report reviewed  HPI: Patient is a 56 year old female followed for hepatitis C has a history of constrictive patient of her back. She is followed by medical oncology for iron deficiency anemia. She underwent scanning CT scan showing a right upper lobe lung nodule which which was minimally enlarged over time additional follow-up CT scan. She underwent navigational bronchoscopy which was positive for adenocarcinoma. Patient was seen by medical oncology today hasn't adamantly refuses any surgical intervention and is now referred to radiation oncology for consideration of treatment. She specifically denies cough hemoptysis or chest tightness. She does still has significant back pain from her previous surgery. She seen today for consideration of treatment.  PLANNED TREATMENT REGIMEN: SB RT  PAST MEDICAL HISTORY:  has a past medical history of Alcohol abuse; Alcoholic cirrhosis of liver without ascites ([place]); Anemia; Anxiety; Arthropathy; Back pain; Bronchitis; BRONCHITIS, ACUTE (11/01/2009); Chronic hepatitis C ([place]); Chronic LBP (12/25/2014); Cirrhosis ([place]); DDD (degenerative disc disease), lumbar (01/01/2015); Depression; Diabetes mellitus without complication ([place]); Diverticulosis; Edema; Fatigue; Fibromyalgia; GERD (gastroesophageal reflux disease); High risk medications (not anticoagulants) long-term use; Hyperglycemia; Hypertension; Kidney mass; Left leg pain (01/01/2015); Lumbago; Lumbar radicular pain (01/01/2015); Muscle spasm; Neck pain (01/01/2015);  Polyarthritis; Reflux; Restless leg syndrome; RLS (restless legs syndrome); Sacroiliac pain (01/01/2015); Shoulder pain; Sinusitis; SOB (shortness of breath); Spine disorder; Stress headaches; Upper back pain (01/01/2015); Urinary frequency; and Vitamin D deficiency disease.    PAST SURGICAL HISTORY:  Past Surgical History:  Procedure Laterality Date  . BACK SURGERY  12/19/2011  . BACK SURGERY    . COLONOSCOPY WITH PROPOFOL N/A 09/25/2015   Procedure: COLONOSCOPY WITH PROPOFOL;  Surgeon: Lollie Sails, MD;  Location: [facility] ENDOSCOPY;  Service: Endoscopy;  Laterality: N/A;  . COLONOSCOPY WITH PROPOFOL N/A 04/01/2016   Procedure: COLONOSCOPY WITH PROPOFOL;  Surgeon: Lollie Sails, MD;  Location[facility] ENDOSCOPY;  Service: Endoscopy;  Laterality: N/A;  . ELECTROMAGNETIC NAVIGATION BROCHOSCOPY N/A 11/25/2016   Procedure: ELECTROMAGNETIC NAVIGATION BRONCHOSCOPY;  Surgeon: Flora Lipps, MD;  Location: [facility] ORS;  Service: Cardiopulmonary;  Laterality: N/A;  . ESOPHAGOGASTRODUODENOSCOPY N/A 04/14/2015   Procedure: ESOPHAGOGASTRODUODENOSCOPY (EGD);  Surgeon: Lollie Sails, MD;  Location[facility] ENDOSCOPY;  Service: Endoscopy;  Laterality: N/A;  . ESOPHAGOGASTRODUODENOSCOPY (EGD) WITH PROPOFOL N/A 09/16/2015   Procedure: ESOPHAGOGASTRODUODENOSCOPY (EGD) WITH PROPOFOL;  Surgeon: Lollie Sails, MD;  Location: [facility] ENDOSCOPY;  Service: Endoscopy;  Laterality: N/A;  . ESOPHAGOGASTRODUODENOSCOPY (EGD) WITH PROPOFOL N/A 01/22/2016   Procedure: ESOPHAGOGASTRODUODENOSCOPY (EGD) WITH PROPOFOL;  Surgeon: [name], MD;  Location: [place];  Service: Endoscopy;  Laterality: N/A;  . ESOPHAGOGASTRODUODENOSCOPY (EGD) WITH PROPOFOL N/A 04/01/2016   Procedure: ESOPHAGOGASTRODUODENOSCOPY (EGD) WITH PROPOFOL;  Surgeon: Lollie Sails, MD;  Location: [facility] ENDOSCOPY;  Service: Endoscopy;  Laterality: N/A;  . HERNIA REPAIR N/A 10/2015   abdominal   . TONSILLECTOMY    . TUBAL LIGATION    . UMBILICAL HERNIA  REPAIR N/A 11/06/2015   Procedure: HERNIA REPAIR UMBILICAL ADULT;  Surgeon: [name], MD;  Location: [facility] ORS;  Service: General;  Laterality:  N/A;    FAMILY HISTORY: family history includes [name] in her father; Breast cancer (age of onset: 52) in her sister; Heart disease in her mother; Stroke in her mother.  SOCIAL HISTORY:  reports that she has been smoking Cigarettes.  She has a 42.00 pack-year smoking history. She has never used smokeless tobacco. She reports that she does not drink alcohol or use drugs.  ALLERGIES: Patient has no known allergies.  MEDICATIONS:  Current Outpatient Prescriptions  Medication Sig Dispense Refill  . furosemide (LASIX) 40 MG tablet Take 40 mg by mouth 2 (two) times daily.     Marland Kitchen lactulose (CHRONULAC) 10 GM/15ML solution Take 10 g by mouth 3 (three) times daily as needed for mild constipation or moderate constipation.     . magnesium oxide (MAG-OX) 400 MG tablet Take 400 mg by mouth daily.    . Multiple Vitamins-Minerals (CENTRUM SILVER 50+WOMEN PO) Take 1 tablet by mouth daily.    Derrill Memo ON 02/16/2017] Oxycodone HCl 20 MG TABS Take 1 tablet (20 mg total) by mouth every 6 (six) hours. 120 tablet 0  . pantoprazole (PROTONIX) 40 MG tablet take once DAILY.  11  . PARoxetine (PAXIL) 40 MG tablet Take 40 mg by mouth every morning.  2  . pregabalin (LYRICA) 100 MG capsule Take 100 mg by mouth 2 (two) times daily.    Marland Kitchen spironolactone (ALDACTONE) 50 MG tablet TAKE 2 TABLETS BY MOUTH EVERY DAY    . thiamine (VITAMIN B-1) 100 MG tablet Take 1 tablet (100 mg total) by mouth daily. 90 tablet 6   No current facility-administered medications for this encounter.     ECOG PERFORMANCE STATUS:  0 - Asymptomatic  REVIEW OF SYSTEMS: Except for the back pain history of cirrhosis Patient denies any weight loss, fatigue, weakness, fever, chills or night sweats. Patient denies any loss of vision, blurred vision. Patient denies any ringing  of the ears or hearing  loss. No irregular heartbeat. Patient denies heart murmur or history of fainting. Patient denies any chest pain or pain radiating to her upper extremities. Patient denies any shortness of breath, difficulty breathing at night, cough or hemoptysis. Patient denies any swelling in the lower legs. Patient denies any nausea vomiting, vomiting of blood, or coffee ground material in the vomitus. Patient denies any stomach pain. Patient states has had normal bowel movements no significant constipation or diarrhea. Patient denies any dysuria, hematuria or significant nocturia. Patient denies any problems walking, swelling in the joints or loss of balance. Patient denies any skin changes, loss of hair or loss of weight. Patient denies any excessive worrying or anxiety or significant depression. Patient denies any problems with insomnia. Patient denies excessive thirst, polyuria, polydipsia. Patient denies any swollen glands, patient denies easy bruising or easy bleeding. Patient denies any recent infections, allergies or URI. Patient "s visual fields have not changed significantly in recent time.    PHYSICAL EXAM: There were no vitals taken for this visit. Patient has significant pain on laying down on or table. OtherwiseWell-developed well-nourished patient in NAD. HEENT reveals PERLA, EOMI, discs not visualized.  Oral cavity is clear. No oral mucosal lesions are identified. Neck is clear without evidence of cervical or supraclavicular adenopathy. Lungs are clear to A&P. Cardiac examination is essentially unremarkable with regular rate and rhythm without murmur rub or thrill. Abdomen is benign with no organomegaly or masses noted. Motor sensory and DTR levels are equal and symmetric in the upper and lower extremities. Cranial nerves II through  XII are grossly intact. Proprioception is intact. No peripheral adenopathy or edema is identified. No motor or sensory levels are noted. Crude visual fields are within normal  range.  LABORATORY DATA: Pathology reports reviewed shows adenocarcinoma with lipidic features    RADIOLOGY RESULTS: CT scans reviewed   IMPRESSION: Stage I adenocarcinoma the right upper lobe in 56 year old female refusing surgical intervention  PLAN: At this time I have offered SB RT to her right upper lobe lesion. Would plan on delivering 5000 cGy in 5 fractions. Would use for D motion study in treatment planning. Risks and benefits of treatment including possible radiation esophagitis fatigue skin reaction and development of cough or were discussed in detail with the patient and her family. I have personally scheduled her for CT simulation in about 2 weeks. Patient family copperhead my treatment plan well and have accepted treatment.  I would like to take this opportunity to thank you for allowing me to participate in the care of your patient.Armstead Peaks., MD

## 2016-12-08 ENCOUNTER — Institutional Professional Consult (permissible substitution): Payer: 59 | Admitting: Radiation Oncology

## 2016-12-16 ENCOUNTER — Ambulatory Visit
Admission: RE | Admit: 2016-12-16 | Discharge: 2016-12-16 | Disposition: A | Discharge status: Home / Self Care | Payer: Medicare Other | Source: Ambulatory Visit | Attending: Radiation Oncology | Admitting: Radiation Oncology

## 2016-12-16 DIAGNOSIS — D509 Iron deficiency anemia, unspecified: Secondary | ICD-10-CM | POA: Diagnosis not present

## 2016-12-16 DIAGNOSIS — E559 Vitamin D deficiency, unspecified: Secondary | ICD-10-CM | POA: Diagnosis not present

## 2016-12-16 DIAGNOSIS — F419 Anxiety disorder, unspecified: Secondary | ICD-10-CM | POA: Diagnosis not present

## 2016-12-16 DIAGNOSIS — F17210 Nicotine dependence, cigarettes, uncomplicated: Secondary | ICD-10-CM | POA: Diagnosis not present

## 2016-12-16 DIAGNOSIS — K219 Gastro-esophageal reflux disease without esophagitis: Secondary | ICD-10-CM | POA: Diagnosis not present

## 2016-12-16 DIAGNOSIS — C3411 Malignant neoplasm of upper lobe, right bronchus or lung: Secondary | ICD-10-CM | POA: Diagnosis not present

## 2016-12-16 DIAGNOSIS — M5136 Other intervertebral disc degeneration, lumbar region: Secondary | ICD-10-CM | POA: Diagnosis not present

## 2016-12-16 DIAGNOSIS — K7030 Alcoholic cirrhosis of liver without ascites: Secondary | ICD-10-CM | POA: Diagnosis not present

## 2016-12-16 DIAGNOSIS — G2581 Restless legs syndrome: Secondary | ICD-10-CM | POA: Diagnosis not present

## 2016-12-16 DIAGNOSIS — F1010 Alcohol abuse, uncomplicated: Secondary | ICD-10-CM | POA: Diagnosis not present

## 2016-12-16 DIAGNOSIS — M797 Fibromyalgia: Secondary | ICD-10-CM | POA: Diagnosis not present

## 2016-12-16 DIAGNOSIS — M1990 Unspecified osteoarthritis, unspecified site: Secondary | ICD-10-CM | POA: Diagnosis not present

## 2016-12-16 DIAGNOSIS — R5383 Other fatigue: Secondary | ICD-10-CM | POA: Diagnosis not present

## 2016-12-16 DIAGNOSIS — R350 Frequency of micturition: Secondary | ICD-10-CM | POA: Diagnosis not present

## 2016-12-16 DIAGNOSIS — Z803 Family history of malignant neoplasm of breast: Secondary | ICD-10-CM | POA: Diagnosis not present

## 2016-12-16 DIAGNOSIS — Z510 Encounter for antineoplastic radiation therapy: Secondary | ICD-10-CM | POA: Diagnosis not present

## 2016-12-16 DIAGNOSIS — M542 Cervicalgia: Secondary | ICD-10-CM | POA: Diagnosis not present

## 2016-12-16 DIAGNOSIS — R0602 Shortness of breath: Secondary | ICD-10-CM | POA: Diagnosis not present

## 2016-12-16 DIAGNOSIS — B182 Chronic viral hepatitis C: Secondary | ICD-10-CM | POA: Diagnosis not present

## 2016-12-16 DIAGNOSIS — M549 Dorsalgia, unspecified: Secondary | ICD-10-CM | POA: Diagnosis not present

## 2016-12-21 DIAGNOSIS — Z510 Encounter for antineoplastic radiation therapy: Secondary | ICD-10-CM | POA: Diagnosis not present

## 2016-12-27 ENCOUNTER — Ambulatory Visit
Admission: RE | Admit: 2016-12-27 | Discharge: 2016-12-27 | Disposition: A | Discharge status: Home / Self Care | Payer: Medicare Other | Source: Ambulatory Visit | Attending: Radiation Oncology | Admitting: Radiation Oncology

## 2016-12-27 DIAGNOSIS — Z510 Encounter for antineoplastic radiation therapy: Secondary | ICD-10-CM | POA: Diagnosis not present

## 2016-12-29 ENCOUNTER — Ambulatory Visit
Admission: RE | Admit: 2016-12-29 | Discharge: 2016-12-29 | Disposition: A | Discharge status: Home / Self Care | Payer: Medicare Other | Source: Ambulatory Visit | Attending: Radiation Oncology | Admitting: Radiation Oncology

## 2016-12-29 DIAGNOSIS — Z510 Encounter for antineoplastic radiation therapy: Secondary | ICD-10-CM | POA: Diagnosis not present

## 2017-01-03 ENCOUNTER — Inpatient Hospital Stay: Payer: Medicare Other | Attending: Internal Medicine

## 2017-01-03 ENCOUNTER — Ambulatory Visit
Admission: RE | Admit: 2017-01-03 | Discharge: 2017-01-03 | Disposition: A | Discharge status: Home / Self Care | Payer: Medicare Other | Source: Ambulatory Visit | Attending: Radiation Oncology | Admitting: Radiation Oncology

## 2017-01-03 ENCOUNTER — Other Ambulatory Visit: Payer: Self-pay

## 2017-01-03 ENCOUNTER — Ambulatory Visit: Payer: 59 | Admitting: Pain Medicine

## 2017-01-03 DIAGNOSIS — C649 Malignant neoplasm of unspecified kidney, except renal pelvis: Secondary | ICD-10-CM | POA: Insufficient documentation

## 2017-01-03 DIAGNOSIS — F329 Major depressive disorder, single episode, unspecified: Secondary | ICD-10-CM | POA: Diagnosis not present

## 2017-01-03 DIAGNOSIS — K219 Gastro-esophageal reflux disease without esophagitis: Secondary | ICD-10-CM | POA: Diagnosis not present

## 2017-01-03 DIAGNOSIS — G8929 Other chronic pain: Secondary | ICD-10-CM | POA: Insufficient documentation

## 2017-01-03 DIAGNOSIS — B182 Chronic viral hepatitis C: Secondary | ICD-10-CM | POA: Diagnosis not present

## 2017-01-03 DIAGNOSIS — E538 Deficiency of other specified B group vitamins: Secondary | ICD-10-CM

## 2017-01-03 DIAGNOSIS — I10 Essential (primary) hypertension: Secondary | ICD-10-CM | POA: Insufficient documentation

## 2017-01-03 DIAGNOSIS — D500 Iron deficiency anemia secondary to blood loss (chronic): Secondary | ICD-10-CM | POA: Diagnosis not present

## 2017-01-03 DIAGNOSIS — F17210 Nicotine dependence, cigarettes, uncomplicated: Secondary | ICD-10-CM | POA: Insufficient documentation

## 2017-01-03 DIAGNOSIS — Z510 Encounter for antineoplastic radiation therapy: Secondary | ICD-10-CM | POA: Diagnosis not present

## 2017-01-03 DIAGNOSIS — M5116 Intervertebral disc disorders with radiculopathy, lumbar region: Secondary | ICD-10-CM | POA: Diagnosis not present

## 2017-01-03 DIAGNOSIS — F419 Anxiety disorder, unspecified: Secondary | ICD-10-CM | POA: Insufficient documentation

## 2017-01-03 DIAGNOSIS — G2581 Restless legs syndrome: Secondary | ICD-10-CM | POA: Insufficient documentation

## 2017-01-03 DIAGNOSIS — E119 Type 2 diabetes mellitus without complications: Secondary | ICD-10-CM | POA: Insufficient documentation

## 2017-01-03 DIAGNOSIS — C3411 Malignant neoplasm of upper lobe, right bronchus or lung: Secondary | ICD-10-CM | POA: Insufficient documentation

## 2017-01-03 DIAGNOSIS — E559 Vitamin D deficiency, unspecified: Secondary | ICD-10-CM | POA: Diagnosis not present

## 2017-01-03 DIAGNOSIS — Z79899 Other long term (current) drug therapy: Secondary | ICD-10-CM | POA: Insufficient documentation

## 2017-01-03 DIAGNOSIS — M797 Fibromyalgia: Secondary | ICD-10-CM | POA: Insufficient documentation

## 2017-01-03 DIAGNOSIS — K259 Gastric ulcer, unspecified as acute or chronic, without hemorrhage or perforation: Secondary | ICD-10-CM | POA: Insufficient documentation

## 2017-01-03 DIAGNOSIS — R188 Other ascites: Secondary | ICD-10-CM | POA: Insufficient documentation

## 2017-01-03 DIAGNOSIS — D509 Iron deficiency anemia, unspecified: Secondary | ICD-10-CM

## 2017-01-03 LAB — CBC WITH DIFFERENTIAL/PLATELET
Basophils Absolute: 0 10*3/uL (ref 0–0.1)
Basophils Relative: 0 %
Eosinophils Absolute: 0.1 10*3/uL (ref 0–0.7)
Eosinophils Relative: 1 %
HCT: 42 % (ref 35.0–47.0)
Hemoglobin: 14.4 g/dL (ref 12.0–16.0)
Lymphocytes Relative: 19 %
Lymphs Abs: 1.2 10*3/uL (ref 1.0–3.6)
MCH: 35.2 pg — ABNORMAL HIGH (ref 26.0–34.0)
MCHC: 34.3 g/dL (ref 32.0–36.0)
MCV: 102.6 fL — ABNORMAL HIGH (ref 80.0–100.0)
Monocytes Absolute: 0.7 10*3/uL (ref 0.2–0.9)
Monocytes Relative: 10 %
Neutro Abs: 4.6 10*3/uL (ref 1.4–6.5)
Neutrophils Relative %: 70 %
Platelets: 140 10*3/uL — ABNORMAL LOW (ref 150–440)
RBC: 4.1 MIL/uL (ref 3.80–5.20)
RDW: 14.3 % (ref 11.5–14.5)
WBC: 6.6 10*3/uL (ref 3.6–11.0)

## 2017-01-03 LAB — VITAMIN B12: Vitamin B-12: 557 pg/mL (ref 180–914)

## 2017-01-03 LAB — FERRITIN: Ferritin: 132 ng/mL (ref 11–307)

## 2017-01-03 LAB — IRON AND TIBC
Iron: 178 ug/dL — ABNORMAL HIGH (ref 28–170)
Saturation Ratios: 49 % — ABNORMAL HIGH (ref 10.4–31.8)
TIBC: 366 ug/dL (ref 250–450)
UIBC: 188 ug/dL

## 2017-01-03 LAB — FOLATE: Folate: 13.5 ng/mL (ref >5.9)

## 2017-01-04 ENCOUNTER — Ambulatory Visit (HOSPITAL_BASED_OUTPATIENT_CLINIC_OR_DEPARTMENT_OTHER): Payer: Medicare Other | Admitting: Pain Medicine

## 2017-01-04 ENCOUNTER — Ambulatory Visit
Admission: RE | Admit: 2017-01-04 | Discharge: 2017-01-04 | Disposition: A | Discharge status: Home / Self Care | Payer: Medicare Other | Source: Ambulatory Visit | Attending: Pain Medicine | Admitting: Pain Medicine

## 2017-01-04 ENCOUNTER — Encounter: Payer: Self-pay | Admitting: Pain Medicine

## 2017-01-04 VITALS — BP 100/69 | HR 80 | Temp 97.4°F | Resp 17 | Ht 70.0 in | Wt 180.0 lb

## 2017-01-04 DIAGNOSIS — Z79899 Other long term (current) drug therapy: Secondary | ICD-10-CM | POA: Diagnosis not present

## 2017-01-04 DIAGNOSIS — M488X6 Other specified spondylopathies, lumbar region: Secondary | ICD-10-CM | POA: Insufficient documentation

## 2017-01-04 DIAGNOSIS — M533 Sacrococcygeal disorders, not elsewhere classified: Secondary | ICD-10-CM | POA: Diagnosis not present

## 2017-01-04 DIAGNOSIS — M545 Low back pain: Secondary | ICD-10-CM | POA: Insufficient documentation

## 2017-01-04 DIAGNOSIS — G8918 Other acute postprocedural pain: Secondary | ICD-10-CM

## 2017-01-04 DIAGNOSIS — M47816 Spondylosis without myelopathy or radiculopathy, lumbar region: Secondary | ICD-10-CM | POA: Diagnosis not present

## 2017-01-04 DIAGNOSIS — M5442 Lumbago with sciatica, left side: Secondary | ICD-10-CM

## 2017-01-04 DIAGNOSIS — G8929 Other chronic pain: Secondary | ICD-10-CM

## 2017-01-04 LAB — GLUCOSE, CAPILLARY: Glucose-Capillary: 154 mg/dL — ABNORMAL HIGH (ref 65–99)

## 2017-01-04 MED ORDER — LACTATED RINGERS IV SOLN
1000.0000 mL | Freq: Once | INTRAVENOUS | Status: AC
  Administered 2017-01-04 (×2): 1000 mL via INTRAVENOUS

## 2017-01-04 MED ORDER — HYDROCODONE-ACETAMINOPHEN 5-325 MG PO TABS: 1.0000 | ORAL_TABLET | Freq: Four times a day (QID) | ORAL | 0 refills | Status: AC | PRN

## 2017-01-04 MED ORDER — ROPIVACAINE HCL 2 MG/ML IJ SOLN
9.0000 mL | Freq: Once | INTRAMUSCULAR | Status: AC
  Administered 2017-01-04: 10 mL via PERINEURAL
  Filled 2017-01-04: qty 10

## 2017-01-04 MED ORDER — TRIAMCINOLONE ACETONIDE 40 MG/ML IJ SUSP
40.0000 mg | Freq: Once | INTRAMUSCULAR | Status: AC
  Administered 2017-01-04: 40 mg
  Filled 2017-01-04: qty 1

## 2017-01-04 MED ORDER — MIDAZOLAM HCL 5 MG/5ML IJ SOLN
1.0000 mg | INTRAMUSCULAR | Status: DC | PRN
  Administered 2017-01-04: 2 mg via INTRAVENOUS
  Filled 2017-01-04: qty 5

## 2017-01-04 MED ORDER — LIDOCAINE HCL 2 % IJ SOLN
10.0000 mL | Freq: Once | INTRAMUSCULAR | Status: AC
  Administered 2017-01-04: 400 mg
  Filled 2017-01-04: qty 100

## 2017-01-04 MED ORDER — FENTANYL CITRATE (PF) 100 MCG/2ML IJ SOLN
25.0000 ug | INTRAMUSCULAR | Status: DC | PRN
Start: 2017-01-04 — End: 2017-01-04
  Administered 2017-01-04: 100 ug via INTRAVENOUS
  Filled 2017-01-04: qty 2

## 2017-01-04 NOTE — Progress Notes (Signed)
Safety precautions to be maintained throughout the outpatient stay will include: orient to surroundings, keep bed in low position, maintain call bell within reach at all times, provide assistance with transfer out of bed and ambulation.  

## 2017-01-04 NOTE — Progress Notes (Signed)
[name] Name: Audrey Garrett  MRN: [id number]  Referring Provider: Jodi Marble, MD  DOB: 04/14/1960  PCP: Jodi Marble, MD  DOS: 01/04/2017  Note by: Gaspar Cola, MD  Service setting: Ambulatory outpatient  Specialty: Interventional Pain Management  Patient type: Established  Location: [facility] (AMB) Pain Management Facility  Visit type: Interventional Procedure   Primary Reason for Visit: Interventional Pain Management Treatment. CC: Back Pain (left, lower)  Procedure:  Anesthesia, Analgesia, Anxiolysis:  Type: Therapeutic Medial Branch Facet & Sacroiliac joint Radiofrequency Ablation Region: Lumbosacral Level: L2, L3, L4, L5, S1, S2, & S3 Medial Branch Level(s) Laterality: Left-Sided  Type: Local Anesthesia with Moderate (Conscious) Sedation Local Anesthetic: Lidocaine 1% Route: Intravenous (IV) IV Access: Secured Sedation: Meaningful verbal contact was maintained at all times during the procedure  Indication(s): Analgesia and Anxiety   Indications: 1. Lumbar facet syndrome (Bilateral) (L>R)   2. Chronic sacroiliac joint pain (Bilateral) (R>L)   3. Lumbar spondylosis   4. Chronic low back pain (Primary Source of Pain) (Bilateral) (L>R)   5. Acute postoperative pain    [name] has either failed to respond, was unable to tolerate, or simply did not get enough benefit from other more conservative therapies including, but not limited to: 1. Over-the-counter medications 2. Anti-inflammatory medications 3. Muscle relaxants 4. Membrane stabilizers 5. Opioids 6. Physical therapy 7. Modalities (Heat, ice, etc.) 8. Invasive techniques such as nerve blocks. Ms. Heinrich has attained more than 50% relief of the pain from a series of diagnostic injections conducted in separate occasions.  Pain Score: Pre-procedure: 5 /10 Post-procedure: 0-No pain/10  Pre-op Assessment:  [name] is a 56 y.o. (year old), female patient, seen today for interventional treatment. She   has a past surgical history that includes Tubal ligation; Tonsillectomy; Back surgery (12/19/2011); Esophagogastroduodenoscopy (N/A, 04/14/2015); Esophagogastroduodenoscopy (egd) with propofol (N/A, 09/16/2015); Colonoscopy with propofol (N/A, 09/25/2015); Umbilical hernia repair (N/A, 11/06/2015); Hernia repair (N/A, 10/2015); Esophagogastroduodenoscopy (egd) with propofol (N/A, 01/22/2016); Back surgery; Colonoscopy with propofol (N/A, 04/01/2016); Esophagogastroduodenoscopy (egd) with propofol (N/A, 04/01/2016); and Electormagnetic navigation bronchoscopy (N/A, 11/25/2016). [name] has a current medication list which includes the following prescription(s): furosemide, hydrocodone-acetaminophen, lactulose, magnesium oxide, multiple vitamins-minerals, oxycodone hcl, pantoprazole, paroxetine, pregabalin, spironolactone, and thiamine, and the following Facility-Administered Medications: fentanyl and midazolam. Her primarily concern today is the Back Pain (left, lower)  Initial Vital Signs: There were no vitals taken for this visit. BMI: Estimated body mass index is 25.83 kg/m as calculated from the following:   Height as of this encounter: 5\' 10"  (1.778 m).   Weight as of this encounter: 180 lb (81.6 kg).  Risk Assessment: Allergies: Reviewed. She has No Known Allergies.  Allergy Precautions: None required Coagulopathies: Reviewed. None identified.  Blood-thinner therapy: None at this time Active Infection(s): Reviewed. None identified. [name] is afebrile  Site Confirmation: [name] was asked to confirm the procedure and laterality before marking the site Procedure checklist: Completed Consent: Before the procedure and under the influence of no sedative(s), amnesic(s), or anxiolytics, the patient was informed of the treatment options, risks and possible complications. To fulfill our ethical and legal obligations, as recommended by the American Medical Association's Code of Ethics, I have informed the  patient of my clinical impression; the nature and purpose of the treatment or procedure; the risks, benefits, and possible complications of the intervention; the alternatives, including doing nothing; the risk(s) and benefit(s) of the alternative treatment(s) or procedure(s); and the risk(s) and benefit(s) of doing nothing. The  patient was provided information about the general risks and possible complications associated with the procedure. These may include, but are not limited to: failure to achieve desired goals, infection, bleeding, organ or nerve damage, allergic reactions, paralysis, and death. In addition, the patient was informed of those risks and complications associated to Spine-related procedures, such as failure to decrease pain; infection (i.e.: Meningitis, epidural or intraspinal abscess); bleeding (i.e.: epidural hematoma, subarachnoid hemorrhage, or any other type of intraspinal or peri-dural bleeding); organ or nerve damage (i.e.: Any type of peripheral nerve, nerve root, or spinal cord injury) with subsequent damage to sensory, motor, and/or autonomic systems, resulting in permanent pain, numbness, and/or weakness of one or several areas of the body; allergic reactions; (i.e.: anaphylactic reaction); and/or death. Furthermore, the patient was informed of those risks and complications associated with the medications. These include, but are not limited to: allergic reactions (i.e.: anaphylactic or anaphylactoid reaction(s)); adrenal axis suppression; blood sugar elevation that in diabetics may result in ketoacidosis or comma; water retention that in patients with history of congestive heart failure may result in shortness of breath, pulmonary edema, and decompensation with resultant heart failure; weight gain; swelling or edema; medication-induced neural toxicity; particulate matter embolism and blood vessel occlusion with resultant organ, and/or nervous system infarction; and/or aseptic necrosis  of one or more joints. Finally, the patient was informed that Medicine is not an exact science; therefore, there is also the possibility of unforeseen or unpredictable risks and/or possible complications that may result in a catastrophic outcome. The patient indicated having understood very clearly. We have given the patient no guarantees and we have made no promises. Enough time was given to the patient to ask questions, all of which were answered to the patient's satisfaction. Ms. Eckmann has indicated that she wanted to continue with the procedure. Attestation: I, the ordering provider, attest that I have discussed with the patient the benefits, risks, side-effects, alternatives, likelihood of achieving goals, and potential problems during recovery for the procedure that I have provided informed consent. Date: 01/04/2017; Time: 7:57 AM  Pre-Procedure Preparation:  Monitoring: As per clinic protocol. Respiration, ETCO2, SpO2, BP, heart rate and rhythm monitor placed and checked for adequate function Safety Precautions: Patient was assessed for positional comfort and pressure points before starting the procedure. Time-out: I initiated and conducted the "Time-out" before starting the procedure, as per protocol. The patient was asked to participate by confirming the accuracy of the "Time Out" information. Verification of the correct person, site, and procedure were performed and confirmed by me, the nursing staff, and the patient. "Time-out" conducted as per Joint Commission's Universal Protocol (UP.01.01.01). "Time-out" Date & Time: 01/04/2017; 1135 hrs.  Description of Procedure Process:   Target Area: For Lumbar Facet blocks, the target is the groove formed by the junction of the transverse process and superior articular process. For the L5 dorsal ramus, the target is the notch between superior articular process and sacral ala. For the Sacral dorsal rami, the target is the superior and lateral edge of the  posterior S1, S2, & S3 Sacral foramen. Approach: Paraspinal approach. Area Prepped: Entire Posterior Lumbosacral Region Prepping solution: Hibiclens (4.0% Chlorhexidine gluconate solution) Safety Precautions: Aspiration looking for blood return was conducted prior to all injections. At no point did we inject any substances, as a needle was being advanced. No attempts were made at seeking any paresthesias. Safe injection practices and needle disposal techniques used. Medications properly checked for expiration dates. SDV (single dose vial) medications used. Description of  the Procedure: Protocol guidelines were followed. The patient was placed in position over the fluoroscopy table. The target area was identified and the area prepped in the usual manner. Skin desensitized using vapocoolant spray. Skin & deeper tissues infiltrated with local anesthetic. Appropriate amount of time allowed to pass for local anesthetics to take effect. Radiofrequency needles were introduced to the area of the medial branch at the junction of the superior articular process and transverse process using fluoroscopy. Using the [employer], sensory stimulation using 50 Hz was used to locate & identify the nerve, making sure that the needle was positioned such that there was no sensory stimulation below 0.3 V or above 0.7 V. Stimulation using 2 Hz was used to evaluate the motor component. Care was taken not to lesion any nerves that demonstrated motor stimulation of the lower extremities at an output of less than 2.5 times that of the sensory threshold, or a maximum of 2.0 V. Once satisfactory placement of the needles was achieved, the above solution was slowly injected after negative aspiration. After waiting for at least 2 minutes, the ablation was performed at 80 degrees C for 60 seconds.The needles were then removed and the area cleansed, making sure to leave some of the prepping solution back to take advantage of  its long term bactericidal properties. Intra-operative Compliance: Compliant Vitals:   01/04/17 1251 01/04/17 1301 01/04/17 1311 01/04/17 1321  BP: 117/77 108/72 107/71 100/69  Pulse:      Resp: 16 14 19 17   Temp:  (!) 97.4 F (36.3 C)    TempSrc:      SpO2: 98% 97% 98% 99%  Weight:      Height:        Start Time: 1137 hrs. End Time: 1248 hrs. Materials & Medications:  Needle(s) Type: Teflon-coated, curved tip, Radiofrequency needle(s) Gauge: 22G Venom needle(s) Length: 10cm Medication(s): We administered lactated ringers, midazolam, fentaNYL, lidocaine, triamcinolone acetonide, ropivacaine (PF) 2 mg/mL (0.2%), triamcinolone acetonide, and ropivacaine (PF) 2 mg/mL (0.2%). Please see chart orders for dosing details.  Imaging Guidance (Spinal):  Type of Imaging Technique: Fluoroscopy Guidance (Spinal) Indication(s): Assistance in needle guidance and placement for procedures requiring needle placement in or near specific anatomical locations not easily accessible without such assistance. Exposure Time: Please see nurses notes. Contrast: None used. Fluoroscopic Guidance: I was personally present during the use of fluoroscopy. "Tunnel Vision Technique" used to obtain the best possible view of the target area. Parallax error corrected before commencing the procedure. "Direction-depth-direction" technique used to introduce the needle under continuous pulsed fluoroscopy. Once target was reached, antero-posterior, oblique, and lateral fluoroscopic projection used confirm needle placement in all planes. Images permanently stored in EMR. Interpretation: No contrast injected. I personally interpreted the imaging intraoperatively. Adequate needle placement confirmed in multiple planes. Permanent images saved into the patient's record.  Antibiotic Prophylaxis:  Indication(s): None identified Antibiotic given: None  Post-operative Assessment:  EBL: None Complications: No immediate post-treatment  complications observed by team, or reported by patient. Note: The patient tolerated the entire procedure well. A repeat set of vitals were taken after the procedure and the patient was kept under observation following institutional policy, for this type of procedure. Post-procedural neurological assessment was performed, showing return to baseline, prior to discharge. The patient was provided with post-procedure discharge instructions, including a section on how to identify potential problems. Should any problems arise concerning this procedure, the patient was given instructions to immediately contact us, at any time, without hesitation. In any case,  we plan to contact the patient by telephone for a follow-up status report regarding this interventional procedure. Comments:  No additional relevant information.  Plan of Care    Imaging Orders     DG C-Arm 1-60 Min-No Report  Procedure Orders     Radiofrequency,Lumbar     Radiofrequency Sacroiliac Joint  Medications ordered for procedure: Meds ordered this encounter  Medications  . lactated ringers infusion 1,000 mL  . midazolam (VERSED) 5 MG/5ML injection 1-2 mg    Make sure Flumazenil is available in the pyxis when using this medication. If oversedation occurs, administer 0.2 mg IV over 15 sec. If after 45 sec no response, administer 0.2 mg again over 1 min; may repeat at 1 min intervals; not to exceed 4 doses (1 mg)  . fentaNYL (SUBLIMAZE) injection 25-50 mcg    Make sure Narcan is available in the pyxis when using this medication. In the event of respiratory depression (RR< 8/min): Titrate NARCAN (naloxone) in increments of 0.1 to 0.2 mg IV at 2-3 minute intervals, until desired degree of reversal.  . lidocaine (XYLOCAINE) 2 % (with pres) injection 200 mg  . triamcinolone acetonide (KENALOG-40) injection 40 mg  . ropivacaine (PF) 2 mg/mL (0.2%) (NAROPIN) injection 9 mL  . triamcinolone acetonide (KENALOG-40) injection 40 mg  .  ropivacaine (PF) 2 mg/mL (0.2%) (NAROPIN) injection 9 mL  . HYDROcodone-acetaminophen (NORCO/VICODIN) 5-325 MG tablet    Sig: Take 1 tablet by mouth every 6 (six) hours as needed for moderate pain.    Dispense:  28 tablet    Refill:  0    For acute post-operative pain. Not to be refilled. To last 7 days.   Medications administered: We administered lactated ringers, midazolam, fentaNYL, lidocaine, triamcinolone acetonide, ropivacaine (PF) 2 mg/mL (0.2%), triamcinolone acetonide, and ropivacaine (PF) 2 mg/mL (0.2%).  See the medical record for exact dosing, route, and time of administration.  New Prescriptions   HYDROCODONE-ACETAMINOPHEN (NORCO/VICODIN) 5-325 MG TABLET    Take 1 tablet by mouth every 6 (six) hours as needed for moderate pain.   Disposition: Discharge home  Discharge Date & Time: 01/04/2017; 1327 hrs.   Physician-requested Follow-up: Return for post-RFA eval in 6 wks, w/ Dionisio David, NP. Future Appointments Date Time Provider [place]  01/05/2017 9:30 AM [id number] None  01/10/2017 9:30 AM [id number] None  02/15/2017 10:30 AM Vevelyn Francois, NP [facility] None  09/09/2017 10:30 AM Hollice Espy, MD [facility] None   Primary Care Physician: Jodi Marble, MD Location: [facility] Outpatient Pain Management Facility Note by: Gaspar Cola, MD Date: 01/04/2017; Time: 1:57 PM  Disclaimer:  Medicine is not an Chief Strategy Officer. The only guarantee in medicine is that nothing is guaranteed. It is important to note that the decision to proceed with this intervention was based on the information collected from the patient. The Data and conclusions were drawn from the patient's questionnaire, the interview, and the physical examination. Because the information was provided in large part by the patient, it cannot be guaranteed that it has not been purposely or unconsciously manipulated. Every effort has been made to obtain as much relevant data as  possible for this evaluation. It is important to note that the conclusions that lead to this procedure are derived in large part from the available data. Always take into account that the treatment will also be dependent on availability of resources and existing treatment guidelines, considered by other Pain Management Practitioners as being common knowledge and practice, at the  time of the intervention. For Medico-Legal purposes, it is also important to point out that variation in procedural techniques and pharmacological choices are the acceptable norm. The indications, contraindications, technique, and results of the above procedure should only be interpreted and judged by a Board-Certified Interventional Pain Specialist with extensive familiarity and expertise in the same exact procedure and technique.

## 2017-01-04 NOTE — Patient Instructions (Addendum)
____________________________________________________________________________________________  Post-Procedure instructions Instructions:  Apply ice: Fill a plastic sandwich bag with crushed ice. Cover it with a small towel and apply to injection site. Apply for 15 minutes then remove x 15 minutes. Repeat sequence on day of procedure, until you go to bed. The purpose is to minimize swelling and discomfort after procedure.  Apply heat: Apply heat to procedure site starting the day following the procedure. The purpose is to treat any soreness and discomfort from the procedure.  Food intake: Start with clear liquids (like water) and advance to regular food, as tolerated.   Physical activities: Keep activities to a minimum for the first 8 hours after the procedure.   Driving: If you have received any sedation, you are not allowed to drive for 24 hours after your procedure.  Blood thinner: Restart your blood thinner 6 hours after your procedure. (Only for those taking blood thinners)  Insulin: As soon as you can eat, you may resume your normal dosing schedule. (Only for those taking insulin)  Infection prevention: Keep procedure site clean and dry.  Post-procedure Pain Diary: Extremely important that this be done correctly and accurately. Recorded information will be used to determine the next step in treatment.  Pain evaluated is that of treated area only. Do not include pain from an untreated area.  Complete every hour, on the hour, for the initial 8 hours. Set an alarm to help you do this part accurately.  Do not go to sleep and have it completed later. It will not be accurate.  Follow-up appointment: Keep your follow-up appointment after the procedure. Usually 2 weeks for most procedures. (6 weeks in the case of radiofrequency.) Bring you pain diary.  Expect:  From numbing medicine (AKA: Local Anesthetics): Numbness or decrease in pain.  Onset: Full effect within 15 minutes of  injected.  Duration: It will depend on the type of local anesthetic used. On the average, 1 to 8 hours.   From steroids: Decrease in swelling or inflammation. Once inflammation is improved, relief of the pain will follow.  Onset of benefits: Depends on the amount of swelling present. The more swelling, the longer it will take for the benefits to be seen. In some cases, up to 10 days.  Duration: Steroids will stay in the system x 2 weeks. Duration of benefits will depend on multiple posibilities including persistent irritating factors.  From procedure: Some discomfort is to be expected once the numbing medicine wears off. This should be minimal if ice and heat are applied as instructed. Call if:  You experience numbness and weakness that gets worse with time, as opposed to wearing off.  New onset bowel or bladder incontinence. (Spinal procedures only)  Emergency Numbers:  Durning business hours (Monday - Thursday, 8:00 AM - 4:00 PM) (Friday, 9:00 AM - 12:00 Noon): [phone]  After hours: [phone] ____________________________________________________________________________________________  Radiofrequency Lesioning Radiofrequency lesioning is a procedure that is performed to relieve pain. The procedure is often used for back, neck, or arm pain. Radiofrequency lesioning involves the use of a machine that creates radio waves to make heat. During the procedure, the heat is applied to the nerve that carries the pain signal. The heat damages the nerve and interferes with the pain signal. Pain relief usually starts about 2 weeks after the procedure and lasts for 6 months to 1 year. Tell a health care provider about:  Any allergies you have.  All medicines you are taking, including vitamins, herbs, eye drops, creams, and   over-the-counter medicines.  Any problems you or family members have had with anesthetic medicines.  Any blood disorders you have.  Any surgeries you have  had.  Any medical conditions you have.  Whether you are pregnant or may be pregnant. What are the risks? Generally, this is a safe procedure. However, problems may occur, including:  Pain or soreness at the injection site.  Infection at the injection site.  Damage to nerves or blood vessels.  What happens before the procedure?  Ask your health care provider about: ? Changing or stopping your regular medicines. This is especially important if you are taking diabetes medicines or blood thinners. ? Taking medicines such as aspirin and ibuprofen. These medicines can thin your blood. Do not take these medicines before your procedure if your health care provider instructs you not to.  Follow instructions from your health care provider about eating or drinking restrictions.  Plan to have someone take you home after the procedure.  If you go home right after the procedure, plan to have someone with you for 24 hours. What happens during the procedure?  You will be given one or more of the following: ? A medicine to help you relax (sedative). ? A medicine to numb the area (local anesthetic).  You will be awake during the procedure. You will need to be able to talk with the health care provider during the procedure.  With the help of a type of X-ray (fluoroscopy), the health care provider will insert a radiofrequency needle into the area to be treated.  Next, a wire that carries the radio waves (electrode) will be put through the radiofrequency needle. An electrical pulse will be sent through the electrode to verify the correct nerve. You will feel a tingling sensation, and you may have muscle twitching.  Then, the tissue that is around the needle tip will be heated by an electric current that is passed using the radiofrequency machine. This will numb the nerves.  A bandage (dressing) will be put on the insertion area after the procedure is done. The procedure may vary among health care  providers and hospitals. What happens after the procedure?  Your blood pressure, heart rate, breathing rate, and blood oxygen level will be monitored often until the medicines you were given have worn off.  Return to your normal activities as directed by your health care provider. This information is not intended to replace advice given to you by your health care provider. Make sure you discuss any questions you have with your health care provider. Document Released: 11/11/2010 Document Revised: 08/21/2015 Document Reviewed: 04/22/2014 [employer] Interactive Patient Education  2018 [employer] Radiofrequency Lesioning, Care After Refer to this sheet in the next few weeks. These instructions provide you with information about caring for yourself after your procedure. Your health care provider may also give you more specific instructions. Your treatment has been planned according to current medical practices, but problems sometimes occur. Call your health care provider if you have any problems or questions after your procedure. What can I expect after the procedure? After the procedure, it is common to have:  Pain from the burned nerve.  Temporary numbness.  Follow these instructions at home:  Take over-the-counter and prescription medicines only as told by your health care provider.  Return to your normal activities as told by your health care provider. Ask your health care provider what activities are safe for you.  Pay close attention to how you feel after the procedure. If   you start to have pain, write down when it hurts and how it feels. This will help you and your health care provider to know if you need an additional treatment.  Check your needle insertion site every day for signs of infection. Watch for: ? Redness, swelling, or pain. ? Fluid, blood, or pus.  Keep all follow-up visits as told by your health care provider. This is important. Contact a health care provider  if:  Your pain does not get better.  You have redness, swelling, or pain at the needle insertion site.  You have fluid, blood, or pus coming from the needle insertion site.  You have a fever. Get help right away if:  You develop sudden, severe pain.  You develop numbness or tingling near the procedure site that does not go away. This information is not intended to replace advice given to you by your health care provider. Make sure you discuss any questions you have with your health care provider. Document Released: 11/12/2010 Document Revised: 08/21/2015 Document Reviewed: 04/22/2014 [employer] Interactive Patient Education  2018 [employer] Pain Management Discharge Instructions  General Discharge Instructions :  If you need to reach your doctor call: Monday-Friday 8:00 am - 4:00 pm at [phone] or toll free [phone].  After clinic hours [phone] to have operator reach doctor.  Bring all of your medication bottles to all your appointments in the pain clinic.  To cancel or reschedule your appointment with Pain Management please remember to call 24 hours in advance to avoid a fee.  Refer to the educational materials which you have been given on: General Risks, I had my Procedure. Discharge Instructions, Post Sedation.  Post Procedure Instructions:  The drugs you were given will stay in your system until tomorrow, so for the next 24 hours you should not drive, make any legal decisions or drink any alcoholic beverages.  You may eat anything you prefer, but it is better to start with liquids then soups and crackers, and gradually work up to solid foods.  Please notify your doctor immediately if you have any unusual bleeding, trouble breathing or pain that is not related to your normal pain.  Depending on the type of procedure that was done, some parts of your body may feel week and/or numb.  This usually clears up by tonight or the next day.  Walk with the use of an  assistive device or accompanied by an adult for the 24 hours.  You may use ice on the affected area for the first 24 hours.  Put ice in a Ziploc bag and cover with a towel and place against area 15 minutes on 15 minutes off.  You may switch to heat after 24 hours. 

## 2017-01-05 ENCOUNTER — Ambulatory Visit
Admission: RE | Admit: 2017-01-05 | Discharge: 2017-01-05 | Disposition: A | Discharge status: Home / Self Care | Payer: Medicare Other | Source: Ambulatory Visit | Attending: Radiation Oncology | Admitting: Radiation Oncology

## 2017-01-05 ENCOUNTER — Telehealth: Payer: Self-pay | Admitting: *Deleted

## 2017-01-05 DIAGNOSIS — Z510 Encounter for antineoplastic radiation therapy: Secondary | ICD-10-CM | POA: Diagnosis not present

## 2017-01-05 NOTE — Telephone Encounter (Signed)
No problems post procedure. 

## 2017-01-10 ENCOUNTER — Other Ambulatory Visit: Payer: Self-pay | Admitting: *Deleted

## 2017-01-10 ENCOUNTER — Ambulatory Visit
Admission: RE | Admit: 2017-01-10 | Discharge: 2017-01-10 | Disposition: A | Discharge status: Home / Self Care | Payer: Medicare Other | Source: Ambulatory Visit | Attending: Radiation Oncology | Admitting: Radiation Oncology

## 2017-01-10 DIAGNOSIS — Z510 Encounter for antineoplastic radiation therapy: Secondary | ICD-10-CM | POA: Diagnosis not present

## 2017-01-10 DIAGNOSIS — D500 Iron deficiency anemia secondary to blood loss (chronic): Secondary | ICD-10-CM

## 2017-01-10 DIAGNOSIS — C3411 Malignant neoplasm of upper lobe, right bronchus or lung: Secondary | ICD-10-CM

## 2017-02-10 ENCOUNTER — Encounter: Payer: 59 | Admitting: Nurse Practitioner

## 2017-02-10 ENCOUNTER — Ambulatory Visit: Payer: Medicare Other | Attending: Radiation Oncology | Admitting: Radiation Oncology

## 2017-02-15 ENCOUNTER — Other Ambulatory Visit: Payer: Self-pay

## 2017-02-15 ENCOUNTER — Ambulatory Visit: Payer: Medicare Other | Attending: Nurse Practitioner | Admitting: Nurse Practitioner

## 2017-02-15 ENCOUNTER — Encounter: Payer: Self-pay | Admitting: Nurse Practitioner

## 2017-02-15 VITALS — BP 124/78 | HR 102 | Temp 98.2°F | Resp 16 | Ht 69.0 in | Wt 180.0 lb

## 2017-02-15 DIAGNOSIS — M25551 Pain in right hip: Secondary | ICD-10-CM | POA: Insufficient documentation

## 2017-02-15 DIAGNOSIS — F1120 Opioid dependence, uncomplicated: Secondary | ICD-10-CM | POA: Diagnosis not present

## 2017-02-15 DIAGNOSIS — M79605 Pain in left leg: Secondary | ICD-10-CM

## 2017-02-15 DIAGNOSIS — K7030 Alcoholic cirrhosis of liver without ascites: Secondary | ICD-10-CM | POA: Diagnosis not present

## 2017-02-15 DIAGNOSIS — C3411 Malignant neoplasm of upper lobe, right bronchus or lung: Secondary | ICD-10-CM | POA: Insufficient documentation

## 2017-02-15 DIAGNOSIS — F3289 Other specified depressive episodes: Secondary | ICD-10-CM | POA: Insufficient documentation

## 2017-02-15 DIAGNOSIS — G894 Chronic pain syndrome: Secondary | ICD-10-CM

## 2017-02-15 DIAGNOSIS — M4312 Spondylolisthesis, cervical region: Secondary | ICD-10-CM | POA: Insufficient documentation

## 2017-02-15 DIAGNOSIS — Z79891 Long term (current) use of opiate analgesic: Secondary | ICD-10-CM | POA: Insufficient documentation

## 2017-02-15 DIAGNOSIS — M545 Low back pain: Secondary | ICD-10-CM | POA: Diagnosis not present

## 2017-02-15 DIAGNOSIS — M160 Bilateral primary osteoarthritis of hip: Secondary | ICD-10-CM | POA: Insufficient documentation

## 2017-02-15 DIAGNOSIS — Z87891 Personal history of nicotine dependence: Secondary | ICD-10-CM | POA: Diagnosis not present

## 2017-02-15 DIAGNOSIS — M4726 Other spondylosis with radiculopathy, lumbar region: Secondary | ICD-10-CM | POA: Insufficient documentation

## 2017-02-15 DIAGNOSIS — D62 Acute posthemorrhagic anemia: Secondary | ICD-10-CM | POA: Diagnosis not present

## 2017-02-15 DIAGNOSIS — G8929 Other chronic pain: Secondary | ICD-10-CM | POA: Diagnosis present

## 2017-02-15 DIAGNOSIS — G4700 Insomnia, unspecified: Secondary | ICD-10-CM | POA: Insufficient documentation

## 2017-02-15 DIAGNOSIS — M797 Fibromyalgia: Secondary | ICD-10-CM | POA: Insufficient documentation

## 2017-02-15 DIAGNOSIS — D696 Thrombocytopenia, unspecified: Secondary | ICD-10-CM | POA: Diagnosis not present

## 2017-02-15 DIAGNOSIS — F17210 Nicotine dependence, cigarettes, uncomplicated: Secondary | ICD-10-CM | POA: Insufficient documentation

## 2017-02-15 DIAGNOSIS — D500 Iron deficiency anemia secondary to blood loss (chronic): Secondary | ICD-10-CM | POA: Insufficient documentation

## 2017-02-15 DIAGNOSIS — M25559 Pain in unspecified hip: Secondary | ICD-10-CM | POA: Diagnosis not present

## 2017-02-15 DIAGNOSIS — M533 Sacrococcygeal disorders, not elsewhere classified: Secondary | ICD-10-CM | POA: Insufficient documentation

## 2017-02-15 DIAGNOSIS — G2581 Restless legs syndrome: Secondary | ICD-10-CM | POA: Diagnosis not present

## 2017-02-15 DIAGNOSIS — K420 Umbilical hernia with obstruction, without gangrene: Secondary | ICD-10-CM | POA: Insufficient documentation

## 2017-02-15 DIAGNOSIS — B1920 Unspecified viral hepatitis C without hepatic coma: Secondary | ICD-10-CM | POA: Diagnosis not present

## 2017-02-15 DIAGNOSIS — M5412 Radiculopathy, cervical region: Secondary | ICD-10-CM | POA: Diagnosis not present

## 2017-02-15 DIAGNOSIS — M4802 Spinal stenosis, cervical region: Secondary | ICD-10-CM | POA: Diagnosis not present

## 2017-02-15 DIAGNOSIS — M5442 Lumbago with sciatica, left side: Secondary | ICD-10-CM

## 2017-02-15 DIAGNOSIS — M5416 Radiculopathy, lumbar region: Secondary | ICD-10-CM

## 2017-02-15 DIAGNOSIS — Z79899 Other long term (current) drug therapy: Secondary | ICD-10-CM | POA: Insufficient documentation

## 2017-02-15 DIAGNOSIS — E1165 Type 2 diabetes mellitus with hyperglycemia: Secondary | ICD-10-CM | POA: Diagnosis not present

## 2017-02-15 DIAGNOSIS — E1142 Type 2 diabetes mellitus with diabetic polyneuropathy: Secondary | ICD-10-CM | POA: Insufficient documentation

## 2017-02-15 DIAGNOSIS — M25552 Pain in left hip: Secondary | ICD-10-CM | POA: Insufficient documentation

## 2017-02-15 MED ORDER — ORPHENADRINE CITRATE 30 MG/ML IJ SOLN
60.0000 mg | Freq: Once | INTRAMUSCULAR | Status: AC
  Administered 2017-02-15: 60 mg via INTRAMUSCULAR

## 2017-02-15 MED ORDER — OXYCODONE HCL 20 MG PO TABS: 1.0000 | ORAL_TABLET | Freq: Four times a day (QID) | ORAL | 0 refills | Status: DC

## 2017-02-15 MED ORDER — OXYCODONE HCL 20 MG PO TABS: 1.0000 | ORAL_TABLET | Freq: Four times a day (QID) | ORAL | 0 refills | Status: DC | PRN

## 2017-02-15 MED ORDER — ORPHENADRINE CITRATE 30 MG/ML IJ SOLN
INTRAMUSCULAR | Status: AC
  Filled 2017-02-15: qty 2

## 2017-02-15 MED ORDER — KETOROLAC TROMETHAMINE 60 MG/2ML IM SOLN
INTRAMUSCULAR | Status: AC
  Filled 2017-02-15: qty 2

## 2017-02-15 MED ORDER — KETOROLAC TROMETHAMINE 60 MG/2ML IM SOLN
60.0000 mg | Freq: Once | INTRAMUSCULAR | Status: AC
  Administered 2017-02-15: 60 mg via INTRAMUSCULAR

## 2017-02-15 NOTE — Patient Instructions (Signed)

## 2017-02-15 NOTE — Progress Notes (Signed)
\  [id number] Pain Medication Assessment:  Safety precautions to be maintained throughout the outpatient stay will include: orient to surroundings, keep bed in low position, maintain call bell within reach at all times, provide assistance with transfer out of bed and ambulation.  Medication Inspection Compliance: Pill count conducted under aseptic conditions, in front of the patient. Neither the pills nor the bottle was removed from the patient's sight at any time. Once count was completed pills were immediately returned to the patient in their original bottle.  Medication: See above Pill/Patch Count: 8 of 120 pills remain Pill/Patch Appearance: Markings consistent with prescribed medication Bottle Appearance: Standard pharmacy container. Clearly labeled. Filled Date: 78 / 22 / 2018 Last Medication intake:  Today

## 2017-02-15 NOTE — Progress Notes (Signed)
[name] Name: Audrey Garrett  MRN: [id number]  Referring Provider: Jodi Marble, MD  DOB: [birth year]  PCP: Jodi Marble, MD  DOS: 02/15/2017  Note by: Vevelyn Francois NP  Service setting: Ambulatory outpatient  Specialty: Interventional Pain Management  Location: [facility] (AMB) Pain Management Facility    Patient type: Established    Primary Reason(s) for Visit: Encounter for prescription drug management & post-procedure evaluation of chronic illness with mild to moderate exacerbation(Level of risk: moderate) CC: No chief complaint on file.  HPI  Audrey Garrett is a 56 y.o. year old, female patient, who comes today for a post-procedure evaluation and medication management. She has Insomnia, persistent; BP (high blood pressure); Cervical radicular pain; Uncomplicated opioid dependence ([place]); Long term prescription opiate use; Failed back surgical syndrome; Lumbar facet syndrome (Bilateral) (L>R); Osteoarthritis of spine with radiculopathy, lumbar region; Musculoskeletal pain; Chronic low back pain (Primary Source of Pain) (Bilateral) (L>R); HCV (hepatitis C virus); At risk for osteopenia; Lumbar spondylosis; Chronic lumbar radicular pain (Secondary source of pain) (Left); Chronic neck pain; Chronic lower extremity pain (Left); Chronic sacroiliac joint pain (Bilateral) (R>L); Chronic upper back pain; Long term current use of opiate analgesic; Encounter for chronic pain management; Hypomagnesemia (low magnesium levels); Hypotension; Anemia; Thrombocytopenia ([place]); Coagulopathy ([place]); Hyperglycemia; Polyneuropathy; Urinary retention; Difficulty urinating; Nephrolithiasis; Alcoholic cirrhosis of liver with ascites ([place]); Depression, major, recurrent, moderate ([place]); Abdominal pain, chronic, epigastric; Hepatic cirrhosis ([place]); Acute GI bleeding; Neurogenic pain; S/P thoracolumbar fusion (T5-L5); Grade 1 Retrolisthesis (4 mm) of C5 over C6 and C6 over C7; Cervical foraminal stenosis (Left C5-6; Bilateral  C6-7); Hernia of anterior abdominal wall; Incarcerated umbilical hernia; Controlled type 2 diabetes mellitus with hyperglycemia ([place]); Acute blood loss anemia; Chronic pain syndrome; Malnutrition of moderate degree; Iron deficiency anemia due to chronic blood loss; Opiate use; RLS (restless legs syndrome); Bilateral leg edema; Depression, major, in remission ([place]); Chronic hip pain (Bilateral); Osteoarthritis of hip (Bilateral); Personal history of tobacco use, presenting hazards to health; Chronic groin pain, left; Malignant neoplasm of upper lobe of right lung ([place]); and Acute postoperative pain on their problem list. Her primarily concern today is the No chief complaint on file.  Pain Assessment: Location: Lower Back Radiating: left hip down back of leg to bottom of feet Onset: More than a month ago Duration: Chronic pain Quality: Larence Penning, Shooting Severity: 5 /10 (self-reported pain score)  Note: Reported level is compatible with observation.                          Effect on ADL:   Timing: Constant Modifying factors: sitting in recliner  Audrey Garrett was last seen on 11/18/2016 for a procedure. During today's appointment we reviewed Audrey Garrett's post-procedure results, as well as her outpatient medication regimen. She is having shooting pain on the left side into the bottom of her foot.   Further details on both, my assessment(s), as well as the proposed treatment plan, please see below.  Controlled Substance Pharmacotherapy Assessment REMS (Risk Evaluation and Mitigation Strategy)  Analgesic:Oxycodone IR 20 mg every 6 (80 mg/day) MME/day:120 mg/day.   Audrey Specking, RN  02/15/2017 11:26 AM  Sign at close encounter \[id number]\\[id number]\Nursing Pain Medication Assessment:  Safety precautions to be maintained throughout the outpatient stay will include: orient to surroundings, keep bed in low position, maintain call bell within reach at all times, provide assistance with  transfer out of bed and ambulation.  Medication Inspection Compliance: Pill count conducted  under aseptic conditions, in front of the patient. Neither the pills nor the bottle was removed from the patient's sight at any time. Once count was completed pills were immediately returned to the patient in their original bottle.  Medication: See above Pill/Patch Count: 8 of 120 pills remain Pill/Patch Appearance: Markings consistent with prescribed medication Bottle Appearance: Standard pharmacy container. Clearly labeled. Filled Date: 48 / 22 / 2018 Last Medication intake:  Today   Pharmacokinetics: Liberation and absorption (onset of action): WNL Distribution (time to peak effect): WNL Metabolism and excretion (duration of action): WNL         Pharmacodynamics: Desired effects: Analgesia: Audrey Garrett reports >50% benefit. Functional ability: Patient reports that medication allows her to accomplish basic ADLs Clinically meaningful improvement in function (CMIF): Sustained CMIF goals met Perceived effectiveness: Described as relatively effective, allowing for increase in activities of daily living (ADL) Undesirable effects: Side-effects or Adverse reactions: None reported Monitoring: [place] PMP: Online review of the past 27-monthperiod conducted. Compliant with practice rules and regulations Last UDS on record: Summary  Date Value Ref Range Status  11/18/2016 FINAL  Final    Comment:    ==================================================================== TOXASSURE SELECT 13 (MW) ==================================================================== Test                             Result       Flag       Units Drug Present and Declared for Prescription Verification   Oxycodone                      3221         EXPECTED   ng/mg creat   Oxymorphone                    370          EXPECTED   ng/mg creat   Noroxycodone                   >6536        EXPECTED   ng/mg creat   Noroxymorphone                  112          EXPECTED   ng/mg creat    Sources of oxycodone are scheduled prescription medications.    Oxymorphone, noroxycodone, and noroxymorphone are expected    metabolites of oxycodone. Oxymorphone is also available as a    scheduled prescription medication. ==================================================================== Test                      Result    Flag   Units      Ref Range   Creatinine              153              mg/dL      >=20 ==================================================================== Declared Medications:  The flagging and interpretation on this report are based on the  following declared medications.  Unexpected results may arise from  inaccuracies in the declared medications.  **Note: The testing scope of this panel includes these medications:  Oxycodone  **Note: The testing scope of this panel does not include following  reported medications:  Buspirone  Furosemide  Lactulose  Magnesium Oxide  Multivitamin  Pantoprazole (Protonix)  Paroxetine (Paxil)  Pregabalin (Lyrica)  Spironolactone (Aldactone)  Vitamin B1 ==================================================================== For  clinical consultation, please call [phone]. ====================================================================    UDS interpretation: Compliant          Medication Assessment Form: Reviewed. Patient indicates being compliant with therapy Treatment compliance: Compliant Risk Assessment Profile: Aberrant behavior: See prior evaluations. None observed or detected today Comorbid factors increasing risk of overdose: See prior notes. No additional risks detected today Risk of substance use disorder (SUD): Low Opioid Risk Tool - 02/15/17 1110      Family History of Substance Abuse   Alcohol  Negative    Illegal Drugs  Negative    Rx Drugs  Negative      Personal History of Substance Abuse   Alcohol  Positive Female or Female    Illegal Drugs   Negative    Rx Drugs  Negative      Psychological Disease   Psychological Disease  Positive    ADD  Negative    OCD  Negative    Bipolar  Negative    Schizophrenia  Negative    Depression  Positive      Total Score   Opioid Risk Tool Scoring  6    Opioid Risk Interpretation  Moderate Risk      ORT Scoring interpretation table:  Score <3 = Low Risk for SUD  Score between 4-7 = Moderate Risk for SUD  Score >8 = High Risk for Opioid Abuse   Risk Mitigation Strategies:  Patient Counseling: Covered Patient-Prescriber Agreement (PPA): Present and active  Notification to other healthcare providers: Done  Pharmacologic Plan: No change in therapy, at this time  Post-Procedure Assessment  01/04/2017 Procedure: Left Sided RFA Pre-procedure pain score:  5/10 Post-procedure pain score: 0/10         Influential Factors: BMI: 26.58 kg/m Intra-procedural challenges: None observed.         Assessment challenges: None detected.              Reported side-effects: None.        Post-procedural adverse reactions or complications: None reported         Sedation: Please see nurses note. When no sedatives are used, the analgesic levels obtained are directly associated to the effectiveness of the local anesthetics. However, when sedation is provided, the level of analgesia obtained during the initial 1 hour following the intervention, is believed to be the result of a combination of factors. These factors may include, but are not limited to: 1. The effectiveness of the local anesthetics used. 2. The effects of the analgesic(s) and/or anxiolytic(s) used. 3. The degree of discomfort experienced by the patient at the time of the procedure. 4. The patients ability and reliability in recalling and recording the events. 5. The presence and influence of possible secondary gains and/or psychosocial factors. Reported result: Relief experienced during the 1st hour after the procedure: 100 % (Ultra-Short  Term Relief)            Interpretative annotation: Clinically appropriate result. Analgesia during this period is likely to be Local Anesthetic and/or IV Sedative (Analgesic/Anxiolytic) related.          Effects of local anesthetic: The analgesic effects attained during this period are directly associated to the localized infiltration of local anesthetics and therefore cary significant diagnostic value as to the etiological location, or anatomical origin, of the pain. Expected duration of relief is directly dependent on the pharmacodynamics of the local anesthetic used. Long-acting (4-6 hours) anesthetics used.  Reported result: Relief during the next 4 to 6 hour  after the procedure: 100 % (Short-Term Relief)            Interpretative annotation: Clinically appropriate result. Analgesia during this period is likely to be Local Anesthetic-related.          Long-term benefit: Defined as the period of time past the expected duration of local anesthetics (1 hour for short-acting and 4-6 hours for long-acting). With the possible exception of prolonged sympathetic blockade from the local anesthetics, benefits during this period are typically attributed to, or associated with, other factors such as analgesic sensory neuropraxia, antiinflammatory effects, or beneficial biochemical changes provided by agents other than the local anesthetics.  Reported result: Extended relief following procedure: 100 % (Long-Term Relief)            Interpretative annotation: Clinically appropriate result. Good relief. No permanent benefit expected. Inflammation plays a part in the etiology to the pain.          Current benefits: Defined as reported results that persistent at this point in time.   Analgesia: 100 %            Function: Ms. Stilley reports improvement in function ROM: Ms. Koffman reports improvement in ROM Interpretative annotation: Complete relief. Therapeutic success. Adequate RF ablation.           Interpretation: Results would suggest adequate radiofrequency ablation.                  Plan:  Please see "Plan of Care" for details.        Laboratory Chemistry  Inflammation Markers (CRP: Acute Phase) (ESR: Chronic Phase) Lab Results  Component Value Date   CRP <0.5 04/03/2015   ESRSEDRATE 27 04/03/2015                 Renal Function Markers Lab Results  Component Value Date   BUN <5 (L) 11/25/2016   CREATININE 0.82 11/25/2016   GFRAA >60 11/25/2016   GFRNONAA >60 11/25/2016                 Hepatic Function Markers Lab Results  Component Value Date   AST 78 (H) 11/25/2016   ALT 38 11/25/2016   ALBUMIN 4.3 11/25/2016   ALKPHOS 82 11/25/2016                 Electrolytes Lab Results  Component Value Date   NA 139 11/25/2016   K 3.4 (L) 11/25/2016   CL 100 (L) 11/25/2016   CALCIUM 10.0 11/25/2016   MG 1.9 01/22/2016                 Neuropathy Markers Lab Results  Component Value Date   BBCWUGQB16 945 01/03/2017                 Bone Pathology Markers Lab Results  Component Value Date   ALKPHOS 82 11/25/2016   CALCIUM 10.0 11/25/2016                 Rheumatology Markers No results found for: LABURIC, URICUR              Coagulation Parameters Lab Results  Component Value Date   INR 1.29 11/19/2016   LABPROT 16.2 (H) 11/19/2016   APTT 34 01/20/2016   PLT 140 (L) 01/03/2017                 Cardiovascular Markers Lab Results  Component Value Date   BNP 929 (H) 09/23/2013   CKTOTAL 1,597 (H) 12/17/2012  TROPONINI <0.03 05/07/2015   HGB 14.4 01/03/2017   HCT 42.0 01/03/2017                 CA Markers No results found for: CEA, CA125, LABCA2               Note: Lab results reviewed.  Recent Diagnostic Imaging Results  DG C-Arm 1-60 Min-No Report Fluoroscopy was utilized by the requesting physician.  No radiographic  interpretation.   Complexity Note: Imaging results reviewed. Results shared with Ms. Baisley, using [employer].                          Meds   Current Outpatient Medications:  .  furosemide (LASIX) 40 MG tablet, Take 40 mg by mouth 2 (two) times daily. , Disp: , Rfl:  .  lactulose (CHRONULAC) 10 GM/15ML solution, Take 10 g by mouth 3 (three) times daily as needed for mild constipation or moderate constipation. , Disp: , Rfl:  .  magnesium oxide (MAG-OX) 400 MG tablet, Take 400 mg by mouth daily., Disp: , Rfl:  .  Multiple Vitamins-Minerals (CENTRUM SILVER 50+WOMEN PO), Take 1 tablet by mouth daily., Disp: , Rfl:  .  [START ON 05/17/2017] Oxycodone HCl 20 MG TABS, Take 1 tablet (20 mg total) by mouth every 6 (six) hours., Disp: 120 tablet, Rfl: 0 .  [START ON 04/17/2017] Oxycodone HCl 20 MG TABS, Take 1 tablet (20 mg total) by mouth every 6 (six) hours as needed., Disp: 120 tablet, Rfl: 0 .  [START ON 03/18/2017] Oxycodone HCl 20 MG TABS, Take 1 tablet (20 mg total) by mouth every 6 (six) hours as needed., Disp: 120 tablet, Rfl: 0 .  pantoprazole (PROTONIX) 40 MG tablet, take once DAILY., Disp: , Rfl: 11 .  PARoxetine (PAXIL) 40 MG tablet, Take 40 mg by mouth every morning., Disp: , Rfl: 2 .  pregabalin (LYRICA) 100 MG capsule, Take 100 mg by mouth 2 (two) times daily., Disp: , Rfl:  .  spironolactone (ALDACTONE) 50 MG tablet, TAKE 2 TABLETS BY MOUTH EVERY DAY, Disp: , Rfl:  .  thiamine (VITAMIN B-1) 100 MG tablet, Take 1 tablet (100 mg total) by mouth daily., Disp: 90 tablet, Rfl: 6  ROS  Constitutional: Denies any fever or chills Gastrointestinal: No reported hemesis, hematochezia, vomiting, or acute GI distress Musculoskeletal: Denies any acute onset joint swelling, redness, loss of ROM, or weakness Neurological: No reported episodes of acute onset apraxia, aphasia, dysarthria, agnosia, amnesia, paralysis, loss of coordination, or loss of consciousness  Allergies  Ms. Funnell has No Known Allergies.  PFSH  Drug: Ms. Grothe  reports that she does not use drugs. Alcohol:  reports that she does not drink  alcohol. Tobacco:  reports that she has been smoking cigarettes.  She has a 42.00 pack-year smoking history. she has never used smokeless tobacco. Medical:  has a past medical history of Alcohol abuse, Alcoholic cirrhosis of liver without ascites ([place]), Anemia, Anxiety, Arthropathy, Back pain, Bronchitis, BRONCHITIS, ACUTE (11/01/2009), Chronic hepatitis C ([place]), Chronic LBP (12/25/2014), Cirrhosis ([place]), DDD (degenerative disc disease), lumbar (01/01/2015), Depression, Diabetes mellitus without complication ([place]), Diverticulosis, Edema, Fatigue, Fibromyalgia, GERD (gastroesophageal reflux disease), High risk medications (not anticoagulants) long-term use, Hyperglycemia, Hypertension, Kidney mass, Left leg pain (01/01/2015), Lumbago, Lumbar radicular pain (01/01/2015), Muscle spasm, Neck pain (01/01/2015), Polyarthritis, Reflux, Restless leg syndrome, RLS (restless legs syndrome), Sacroiliac pain (01/01/2015), Shoulder pain, Sinusitis, SOB (shortness of breath), Spine disorder, Stress headaches,  Upper back pain (01/01/2015), Urinary frequency, and Vitamin D deficiency disease. Surgical: Ms. Stagner  has a past surgical history that includes Tubal ligation; Tonsillectomy; Back surgery (12/19/2011); Esophagogastroduodenoscopy (N/A, 04/14/2015); Esophagogastroduodenoscopy (egd) with propofol (N/A, 09/16/2015); Colonoscopy with propofol (N/A, 09/25/2015); Umbilical hernia repair (N/A, 11/06/2015); Hernia repair (N/A, 10/2015); Esophagogastroduodenoscopy (egd) with propofol (N/A, 01/22/2016); Back surgery; Colonoscopy with propofol (N/A, 04/01/2016); Esophagogastroduodenoscopy (egd) with propofol (N/A, 04/01/2016); and Electormagnetic navigation bronchoscopy (N/A, 11/25/2016). Family: family history includes [name] in her father; Breast cancer (age of onset: 34) in her sister; Heart disease in her mother; Stroke in her mother.  Constitutional Exam  General appearance: alert, cooperative, in mild distress and moderately obese in  abdominal area Vitals:   02/15/17 1105  BP: 124/78  Pulse: (!) 102  Resp: 16  Temp: 98.2 F (36.8 C)  SpO2: 99%  Weight: 180 lb (81.6 kg)  Height: _0  (1.753 m)   BMI Assessment: Estimated body mass index is 26.58 kg/m as calculated from the following:   Height as of this encounter: _1  (1.753 m).   Weight as of this encounter: 180 lb (81.6 kg). Psych/Mental status: Alert, oriented x 3 (person, place, & time)       Eyes: PERLA Respiratory: No evidence of acute respiratory distress  Cervical Spine Area Exam  Skin & Axial Inspection: No masses, redness, edema, swelling, or associated skin lesions Alignment: Symmetrical Functional ROM: Unrestricted ROM      Stability: No instability detected Muscle Tone/Strength: Functionally intact. No obvious neuro-muscular anomalies detected. Sensory (Neurological): Unimpaired Palpation: No palpable anomalies              Upper Extremity (UE) Exam    Side: Right upper extremity  Side: Left upper extremity  Skin & Extremity Inspection: Skin color, temperature, and hair growth are WNL. No peripheral edema or cyanosis. No masses, redness, swelling, asymmetry, or associated skin lesions. No contractures.  Skin & Extremity Inspection: Skin color, temperature, and hair growth are WNL. No peripheral edema or cyanosis. No masses, redness, swelling, asymmetry, or associated skin lesions. No contractures.  Functional ROM: Unrestricted ROM          Functional ROM: Unrestricted ROM          Muscle Tone/Strength: Functionally intact. No obvious neuro-muscular anomalies detected.  Muscle Tone/Strength: Functionally intact. No obvious neuro-muscular anomalies detected.  Sensory (Neurological): Unimpaired          Sensory (Neurological): Unimpaired          Palpation: No palpable anomalies              Palpation: No palpable anomalies              Specialized Test(s): Deferred         Specialized Test(s): Deferred          Thoracic Spine Area Exam  Skin &  Axial Inspection: Well healed scar from previous spine surgery detected Alignment: Asymmetric Functional ROM: Decreased ROM Stability: No instability detected Muscle Tone/Strength: Functionally intact. No obvious neuro-muscular anomalies detected. Sensory (Neurological): Unimpaired Muscle strength & Tone: No palpable anomalies  Lumbar Spine Area Exam  Skin & Axial Inspection: Well healed scar from previous spine surgery detected Alignment: Asymmetric Functional ROM: Decreased ROM      Stability: No instability detected Muscle Tone/Strength: Functionally intact. No obvious neuro-muscular anomalies detected. Sensory (Neurological): Unimpaired Palpation: Complains of area being tender to palpation       Provocative Tests: Lumbar Hyperextension and rotation test: evaluation deferred today  Lumbar Lateral bending test: Positive       Patrick's Maneuver: evaluation deferred today                    Gait & Posture Assessment  Ambulation: Unassisted Gait: Antalgic Posture: WNL   Lower Extremity Exam    Side: Right lower extremity  Side: Left lower extremity  Skin & Extremity Inspection: Skin color, temperature, and hair growth are WNL. No peripheral edema or cyanosis. No masses, redness, swelling, asymmetry, or associated skin lesions. No contractures.  Skin & Extremity Inspection: Skin color, temperature, and hair growth are WNL. No peripheral edema or cyanosis. No masses, redness, swelling, asymmetry, or associated skin lesions. No contractures.  Functional ROM: Unrestricted ROM          Functional ROM: Unrestricted ROM          Muscle Tone/Strength: Functionally intact. No obvious neuro-muscular anomalies detected.  Muscle Tone/Strength: Functionally intact. No obvious neuro-muscular anomalies detected.  Sensory (Neurological): Unimpaired  Sensory (Neurological): Unimpaired  Palpation: No palpable anomalies  Palpation: No palpable anomalies   Assessment  Primary Diagnosis &  Pertinent Problem List: The primary encounter diagnosis was Chronic low back pain (Primary Source of Pain) (Bilateral) (L>R). Diagnoses of Chronic lumbar radicular pain (Secondary source of pain) (Left), Chronic lower extremity pain (Left), Chronic hip pain (Bilateral), and Chronic pain syndrome were also pertinent to this visit.  Status Diagnosis  Controlled Persistent Controlled 1. Chronic low back pain (Primary Source of Pain) (Bilateral) (L>R)   2. Chronic lumbar radicular pain (Secondary source of pain) (Left)   3. Chronic lower extremity pain (Left)   4. Chronic hip pain (Bilateral)   5. Chronic pain syndrome     Problems updated and reviewed during this visit: No problems updated. Plan of Care  Pharmacotherapy (Medications Ordered): Meds ordered this encounter  Medications  . Oxycodone HCl 20 MG TABS    Sig: Take 1 tablet (20 mg total) by mouth every 6 (six) hours.    Dispense:  120 tablet    Refill:  0    Fill one day early if pharmacy is closed on scheduled refill date. Do not fill until:05/17/2017 To last until: 06/16/2017    Order Specific Question:   Supervising Provider    Answer:   [name] [phone]  . Oxycodone HCl 20 MG TABS    Sig: Take 1 tablet (20 mg total) by mouth every 6 (six) hours as needed.    Dispense:  120 tablet    Refill:  0    Fill one day early if pharmacy is closed on scheduled refill date. Do not fill until: 04/17/2017 To last until: 05/17/2017    Order Specific Question:   Supervising Provider    Answer:   [name] [phone]  . Oxycodone HCl 20 MG TABS    Sig: Take 1 tablet (20 mg total) by mouth every 6 (six) hours as needed.    Dispense:  120 tablet    Refill:  0    Fill one day early if pharmacy is closed on scheduled refill date. Do not fill until: 03/18/2017 To last until: 04/17/2017    Order Specific Question:   Supervising Provider    Answer:   [name] [phone]  . orphenadrine (NORFLEX) injection 60 mg  .  ketorolac (TORADOL) injection 60 mg  This SmartLink is deprecated. Use AVSMEDLIST instead to display the medication list for a patient. Medications administered today: We administered orphenadrine and ketorolac. Lab-work, procedure(s),  and/or referral(s): Orders Placed This Encounter  Procedures  . Lumbar Epidural Injection  . Ambulatory referral to Physical Therapy   Imaging and/or referral(s): AMB REFERRAL TO PHYSICAL THERAPY  Interventional therapies: Planned, scheduled, and/or pending:  Not at this time     Considering:  Diagnostic bilateral lumbar facetblock  Possible bilateral lumbar facet RFA Diagnostic left caudal Epiduralsteroid injection + diagnostic epidurogram Possible Racz procedure Diagnostic bilateral sacroiliacjoint injection  Possible bilateral sacroiliac joint RFA Diagnostic left cervical epiduralsteroid injection  Diagnostic bilateral cervical facetblock  Possible bilateral cervical facet RFA   Palliative PRN treatment(s):  Diagnostic bilateral lumbar facet blockunder fluoroscopic guidance and IV sedation  Diagnostic bilateral intra-articular hipjoint injection under fluoroscopic guidance and IV sedation  Diagnostic bilateral sacroiliacjoint block under fluoroscopic guidance and IV sedation       Provider-requested follow-up: Return in about 3 months (around 05/18/2017) for MedMgmt, Procedure(w/Sedation), w/ Dr. Dossie Arbour, ([facility]).  Future Appointments  Date Time Provider [place]  04/04/2017 10:30 AM [facility]  04/07/2017  2:00 PM [facility] None  04/07/2017  2:15 PM Sindy Guadeloupe, MD [facility] None  05/04/2017 10:30 AM Vevelyn Francois, NP [facility] None  09/09/2017 10:30 AM Hollice Espy, MD [facility] None   Primary Care Physician: Jodi Marble, MD Location: [facility] Note by: Vevelyn Francois NP Date: 02/15/2017; Time: 9:10 AM  Pain Score Disclaimer: We use  the NRS-11 scale. This is a self-reported, subjective measurement of pain severity with only modest accuracy. It is used primarily to identify changes within a particular patient. It must be understood that outpatient pain scales are significantly less accurate that those used for research, where they can be applied under ideal controlled circumstances with minimal exposure to variables. In reality, the score is likely to be a combination of pain intensity and pain affect, where pain affect describes the degree of emotional arousal or changes in action readiness caused by the sensory experience of pain. Factors such as social and work situation, setting, emotional state, anxiety levels, expectation, and prior pain experience may influence pain perception and show large inter-individual differences that may also be affected by time variables.  Patient instructions provided during this appointment: Patient Instructions   ____________________________________________________________________________________________  Medication Rules  Applies to: All patients receiving prescriptions (written or electronic).  Pharmacy of record: Pharmacy where electronic prescriptions will be sent. If written prescriptions are taken to a different pharmacy, please inform the nursing staff. The pharmacy listed in the electronic medical record should be the one where you would like electronic prescriptions to be sent.  Prescription refills: Only during scheduled appointments. Applies to both, written and electronic prescriptions.  NOTE: The following applies primarily to controlled substances (Opioid* Pain Medications).   [name] responsibilities: 1. Pain Pills: Bring all pain pills to every appointment (except for procedure appointments). 2. Pill Bottles: Bring pills in original pharmacy bottle. Always bring newest bottle. Bring bottle, even if empty. 3. Medication refills: You are responsible for knowing and keeping  track of what medications you need refilled. The day before your appointment, write a list of all prescriptions that need to be refilled. Bring that list to your appointment and give it to the admitting nurse. Prescriptions will be written only during appointments. If you forget a medication, it will not be "Called in", "Faxed", or "electronically sent". You will need to get another appointment to get these prescribed. 4. Prescription Accuracy: You are responsible for carefully inspecting your prescriptions before leaving our  office. Have the discharge nurse carefully go over each prescription with you, before taking them home. Make sure that your name is accurately spelled, that your address is correct. Check the name and dose of your medication to make sure it is accurate. Check the number of pills, and the written instructions to make sure they are clear and accurate. Make sure that you are given enough medication to last until your next medication refill appointment. 5. Taking Medication: Take medication as prescribed. Never take more pills than instructed. Never take medication more frequently than prescribed. Taking less pills or less frequently is permitted and encouraged, when it comes to controlled substances (written prescriptions).  6. Inform other Doctors: Always inform, all of your healthcare providers, of all the medications you take. 7. Pain Medication from other Providers: You are not allowed to accept any additional pain medication from any other Doctor or Healthcare provider. There are two exceptions to this rule. (see below) In the event that you require additional pain medication, you are responsible for notifying us, as stated below. 8. Medication Agreement: You are responsible for carefully reading and following our Medication Agreement. This must be signed before receiving any prescriptions from our practice. Safely store a copy of your signed Agreement. Violations to the Agreement will  result in no further prescriptions. (Additional copies of our Medication Agreement are available upon request.) 9. Laws, Rules, & Regulations: All patients are expected to follow all Federal and [employer], [employer], Rules, [employer]. Ignorance of the Laws does not constitute a valid excuse. The use of any illegal substances is prohibited. 10. Adopted CDC guidelines & recommendations: Target dosing levels will be at or below 60 MME/day. Use of benzodiazepines** is not recommended.  Exceptions: There are only two exceptions to the rule of not receiving pain medications from other Healthcare Providers. 1. Exception #1 (Emergencies): In the event of an emergency (i.e.: accident requiring emergency care), you are allowed to receive additional pain medication. However, you are responsible for: As soon as you are able, call our office [phone], at any time of the day or night, and leave a message stating your name, the date and nature of the emergency, and the name and dose of the medication prescribed. In the event that your call is answered by a member of our staff, make sure to document and save the date, time, and the name of the person that took your information.  2. Exception #2 (Planned Surgery): In the event that you are scheduled by another doctor or dentist to have any type of surgery or procedure, you are allowed (for a period no longer than 30 days), to receive additional pain medication, for the acute post-op pain. However, in this case, you are responsible for picking up a copy of our "Post-op Pain Management for Surgeons" handout, and giving it to your surgeon or dentist. This document is available at our office, and does not require an appointment to obtain it. Simply go to our office during business hours (Monday-Thursday from 8:00 AM to 4:00 PM) (Friday 8:00 AM to 12:00 Noon) or if you have a scheduled appointment with [place], prior to your surgery, and ask for it by name. In addition, you  will need to provide [place] with your name, name of your surgeon, type of surgery, and date of procedure or surgery.  *Opioid medications include: morphine, codeine, oxycodone, oxymorphone, hydrocodone, hydromorphone, meperidine, tramadol, tapentadol, buprenorphine, fentanyl, methadone. **Benzodiazepine medications include: diazepam (Valium), alprazolam (Xanax), clonazepam (Klonopine), lorazepam (  Ativan), clorazepate (Tranxene), chlordiazepoxide (Librium), estazolam (Prosom), oxazepam (Serax), temazepam (Restoril), triazolam (Halcion)  ____________________________________________________________________________________________

## 2017-04-04 ENCOUNTER — Ambulatory Visit: Payer: Medicare Other

## 2017-04-04 ENCOUNTER — Ambulatory Visit
Admission: RE | Admit: 2017-04-04 | Discharge: 2017-04-04 | Disposition: A | Discharge status: Home / Self Care | Payer: Medicare Other | Source: Ambulatory Visit | Attending: Urology | Admitting: Urology

## 2017-04-04 DIAGNOSIS — K760 Fatty (change of) liver, not elsewhere classified: Secondary | ICD-10-CM | POA: Insufficient documentation

## 2017-04-04 DIAGNOSIS — R911 Solitary pulmonary nodule: Secondary | ICD-10-CM | POA: Insufficient documentation

## 2017-04-04 DIAGNOSIS — D500 Iron deficiency anemia secondary to blood loss (chronic): Secondary | ICD-10-CM

## 2017-04-04 DIAGNOSIS — K8020 Calculus of gallbladder without cholecystitis without obstruction: Secondary | ICD-10-CM | POA: Insufficient documentation

## 2017-04-04 DIAGNOSIS — N2889 Other specified disorders of kidney and ureter: Secondary | ICD-10-CM | POA: Diagnosis present

## 2017-04-04 DIAGNOSIS — C3411 Malignant neoplasm of upper lobe, right bronchus or lung: Secondary | ICD-10-CM

## 2017-04-04 DIAGNOSIS — K7460 Unspecified cirrhosis of liver: Secondary | ICD-10-CM | POA: Diagnosis not present

## 2017-04-04 LAB — POCT I-STAT CREATININE: Creatinine, Ser: 0.7 mg/dL (ref 0.44–1.00)

## 2017-04-04 MED ORDER — IOPAMIDOL (ISOVUE-370) INJECTION 76%
100.0000 mL | Freq: Once | INTRAVENOUS | Status: AC | PRN
  Administered 2017-04-04: 100 mL via INTRAVENOUS

## 2017-04-05 ENCOUNTER — Telehealth: Payer: Self-pay | Admitting: Urology

## 2017-04-05 NOTE — Telephone Encounter (Signed)
I was forwarded to me by medical oncology.  I do need to see her to discuss.  Please schedule.    Hollice Espy, MD

## 2017-04-05 NOTE — Telephone Encounter (Signed)
I spoke with the patient today and she stated that when she went for her chest CT the radiologists  told her she needed to have the CT scan done that you ordered so they did it.  I have scheduled a follow up for her with you.  Audrey Garrett

## 2017-04-05 NOTE — Telephone Encounter (Signed)
I don't know why this got scheduled I had not even released it to schedule it is still in my workqueue? I will call the patient to come in.  Sharyn Lull

## 2017-04-05 NOTE — Telephone Encounter (Signed)
-----   Message from Hollice Espy, MD sent at 04/04/2017  2:33 PM EST ----- This study was performed 6 months early for unclear reasons.  I was supposed to be scheduled for 08/2017 when her follow up appointment is.  That being said, I need to see her in the off office in the near future to discuss.   Hollice Espy, MD

## 2017-04-07 ENCOUNTER — Ambulatory Visit (INDEPENDENT_AMBULATORY_CARE_PROVIDER_SITE_OTHER): Payer: Medicare Other | Admitting: Urology

## 2017-04-07 ENCOUNTER — Inpatient Hospital Stay: Payer: Medicare Other | Attending: Oncology

## 2017-04-07 ENCOUNTER — Encounter: Payer: Self-pay | Admitting: Urology

## 2017-04-07 ENCOUNTER — Inpatient Hospital Stay (HOSPITAL_BASED_OUTPATIENT_CLINIC_OR_DEPARTMENT_OTHER): Payer: Medicare Other | Admitting: Oncology

## 2017-04-07 ENCOUNTER — Other Ambulatory Visit: Payer: Self-pay

## 2017-04-07 VITALS — BP 110/75 | HR 87 | Temp 98.9°F | Resp 20 | Wt 186.9 lb

## 2017-04-07 VITALS — BP 122/78 | HR 109 | Ht 69.0 in | Wt 186.6 lb

## 2017-04-07 DIAGNOSIS — N2889 Other specified disorders of kidney and ureter: Secondary | ICD-10-CM

## 2017-04-07 DIAGNOSIS — K7460 Unspecified cirrhosis of liver: Secondary | ICD-10-CM | POA: Diagnosis not present

## 2017-04-07 DIAGNOSIS — C641 Malignant neoplasm of right kidney, except renal pelvis: Secondary | ICD-10-CM

## 2017-04-07 DIAGNOSIS — D509 Iron deficiency anemia, unspecified: Secondary | ICD-10-CM

## 2017-04-07 DIAGNOSIS — C3411 Malignant neoplasm of upper lobe, right bronchus or lung: Secondary | ICD-10-CM | POA: Insufficient documentation

## 2017-04-07 DIAGNOSIS — D500 Iron deficiency anemia secondary to blood loss (chronic): Secondary | ICD-10-CM

## 2017-04-07 LAB — CBC WITH DIFFERENTIAL/PLATELET
Basophils Absolute: 0 10*3/uL (ref 0–0.1)
Basophils Relative: 1 %
Eosinophils Absolute: 0.1 10*3/uL (ref 0–0.7)
Eosinophils Relative: 2 %
HCT: 38.1 % (ref 35.0–47.0)
Hemoglobin: 12.9 g/dL (ref 12.0–16.0)
Lymphocytes Relative: 16 %
Lymphs Abs: 1.3 10*3/uL (ref 1.0–3.6)
MCH: 37 pg — ABNORMAL HIGH (ref 26.0–34.0)
MCHC: 34 g/dL (ref 32.0–36.0)
MCV: 109 fL — ABNORMAL HIGH (ref 80.0–100.0)
Monocytes Absolute: 0.8 10*3/uL (ref 0.2–0.9)
Monocytes Relative: 10 %
Neutro Abs: 5.8 10*3/uL (ref 1.4–6.5)
Neutrophils Relative %: 71 %
Platelets: 135 10*3/uL — ABNORMAL LOW (ref 150–440)
RBC: 3.49 MIL/uL — ABNORMAL LOW (ref 3.80–5.20)
RDW: 14.3 % (ref 11.5–14.5)
WBC: 8.1 10*3/uL (ref 3.6–11.0)

## 2017-04-07 LAB — IRON AND TIBC
Iron: 131 ug/dL (ref 28–170)
Saturation Ratios: 40 % — ABNORMAL HIGH (ref 10.4–31.8)
TIBC: 324 ug/dL (ref 250–450)
UIBC: 193 ug/dL

## 2017-04-07 LAB — FERRITIN: Ferritin: 69 ng/mL (ref 11–307)

## 2017-04-07 NOTE — Progress Notes (Signed)
Here for follow up. Stated energy level very low w poor appetite -no appetite per pt. Stated feeling" bloated from my liver "

## 2017-04-08 ENCOUNTER — Encounter: Payer: Self-pay | Admitting: Oncology

## 2017-04-08 ENCOUNTER — Other Ambulatory Visit: Payer: Self-pay | Admitting: Radiology

## 2017-04-08 DIAGNOSIS — N2889 Other specified disorders of kidney and ureter: Secondary | ICD-10-CM

## 2017-04-08 NOTE — Progress Notes (Signed)
04/07/2017 9:51 AM   Audrey Garrett February 24, 1961 [id number]  Referring provider: Jodi Marble, MD [place], [place] [id number]  Chief Complaint  Patient presents with  . Follow-up    CT results    HPI: 57 yo F with history of lung cancer and a renal mass on active surveillance.  Most recent CT abdomen with and without contrast on 04/04/2016 now shows a lesion with accelerated growth measuring 2.5 x 2.6 x 2.4 cm which is up significantly from 2.2 x 2.2 x 2.2 as of 09/2016.  She initially presented with an enhancing 1.7 cm right upper pole renal mass which was incidentally discovered on CT abdomen and pelvis with contrast on 01/2016. In retrospect, on imaging back in 2012, the lesion was quite subtle but present measuring approximately 1.0 cm at that time as well.  She denies any flank pain, gross hematuria, or any other urinary symptoms.    She continues to be followed for her stage I lung cancer.  She was treated with S/P RT and declined surgery.  She is being actively followed by Dr. Janese Banks and is seeing her later today.  Her past medical history is significant for multiple medical problems including a history of alcoholic cirrhosis, stage IV, with significant ascites, hep C s/p [place], DM II. she continues to have abdominal ascites requiring intermittent paracentesis.   PMH: Past Medical History:  Diagnosis Date  . Alcohol abuse   . Alcoholic cirrhosis of liver without ascites ([place])   . Anemia   . Anxiety   . Arthropathy   . Back pain   . Bronchitis   . BRONCHITIS, ACUTE 11/01/2009   Qualifier: Diagnosis of  By: Alveta Heimlich MD, Cornelia Copa    . Chronic hepatitis C ([place])   . Chronic LBP 12/25/2014   Overview:  Post extensive surgery   . Cirrhosis ([place])   . DDD (degenerative disc disease), lumbar 01/01/2015  . Depression   . Diabetes mellitus without complication ([place])   . Diverticulosis   . Edema   . Fatigue   . Fibromyalgia   . GERD (gastroesophageal reflux disease)    gastroparesis  . High risk medications (not anticoagulants) long-term use   . Hyperglycemia   . Hypertension   . Kidney mass    11/17 small mass-  . Left leg pain 01/01/2015  . Lumbago   . Lumbar radicular pain 01/01/2015  . Muscle spasm   . Neck pain 01/01/2015  . Polyarthritis   . Reflux   . Restless leg syndrome    01/2016  . RLS (restless legs syndrome)   . Sacroiliac pain 01/01/2015  . Shoulder pain   . Sinusitis   . SOB (shortness of breath)   . Spine disorder   . Stress headaches   . Upper back pain 01/01/2015  . Urinary frequency   . Vitamin D deficiency disease     Surgical History: Past Surgical History:  Procedure Laterality Date  . BACK SURGERY  12/19/2011  . BACK SURGERY    . COLONOSCOPY WITH PROPOFOL N/A 09/25/2015   Procedure: COLONOSCOPY WITH PROPOFOL;  Surgeon: Lollie Sails, MD;  Location: [facility] ENDOSCOPY;  Service: Endoscopy;  Laterality: N/A;  . COLONOSCOPY WITH PROPOFOL N/A 04/01/2016   Procedure: COLONOSCOPY WITH PROPOFOL;  Surgeon: Lollie Sails, MD;  Location: [facility] ENDOSCOPY;  Service: Endoscopy;  Laterality: N/A;  . ELECTROMAGNETIC NAVIGATION BROCHOSCOPY N/A 11/25/2016   Procedure: ELECTROMAGNETIC NAVIGATION BRONCHOSCOPY;  Surgeon: Flora Lipps, MD;  Location: [facility] ORS;  Service: Cardiopulmonary;  Laterality: N/A;  . ESOPHAGOGASTRODUODENOSCOPY N/A 04/14/2015   Procedure: ESOPHAGOGASTRODUODENOSCOPY (EGD);  Surgeon: Lollie Sails, MD;  Location: [facility] ENDOSCOPY;  Service: Endoscopy;  Laterality: N/A;  . ESOPHAGOGASTRODUODENOSCOPY (EGD) WITH PROPOFOL N/A 09/16/2015   Procedure: ESOPHAGOGASTRODUODENOSCOPY (EGD) WITH PROPOFOL;  Surgeon: Lollie Sails, MD;  Location: [facility] ENDOSCOPY;  Service: Endoscopy;  Laterality: N/A;  . ESOPHAGOGASTRODUODENOSCOPY (EGD) WITH PROPOFOL N/A 01/22/2016   Procedure: ESOPHAGOGASTRODUODENOSCOPY (EGD) WITH PROPOFOL;  Surgeon: [name], MD;  Location: [place];  Service: Endoscopy;  Laterality: N/A;  .  ESOPHAGOGASTRODUODENOSCOPY (EGD) WITH PROPOFOL N/A 04/01/2016   Procedure: ESOPHAGOGASTRODUODENOSCOPY (EGD) WITH PROPOFOL;  Surgeon: Lollie Sails, MD;  Location: [facility] ENDOSCOPY;  Service: Endoscopy;  Laterality: N/A;  . HERNIA REPAIR N/A 10/2015   abdominal   . TONSILLECTOMY    . TUBAL LIGATION    . UMBILICAL HERNIA REPAIR N/A 11/06/2015   Procedure: HERNIA REPAIR UMBILICAL ADULT;  Surgeon: [name], MD;  Location: [facility] ORS;  Service: General;  Laterality: N/A;    Home Medications:  Allergies as of 04/07/2017   No Known Allergies     Medication List        Accurate as of 04/07/17 11:59 PM. Always use your most recent med list.          CENTRUM SILVER 50+WOMEN PO Take 1 tablet by mouth daily.   furosemide 40 MG tablet Commonly known as:  LASIX Take 40 mg by mouth 2 (two) times daily.   lactulose 10 GM/15ML solution Commonly known as:  CHRONULAC Take 10 g by mouth 3 (three) times daily as needed for mild constipation or moderate constipation.   magnesium oxide 400 MG tablet Commonly known as:  MAG-OX Take 400 mg by mouth daily.   metFORMIN 500 MG tablet Commonly known as:  GLUCOPHAGE Take 500 mg by mouth 3 (three) times daily after meals.   Oxycodone HCl 20 MG Tabs Take 1 tablet (20 mg total) by mouth every 6 (six) hours. Start taking on:  05/17/2017   pantoprazole 40 MG tablet Commonly known as:  PROTONIX take once DAILY.   PARoxetine 40 MG tablet Commonly known as:  PAXIL Take 40 mg by mouth every morning.   pregabalin 100 MG capsule Commonly known as:  LYRICA Take 100 mg by mouth 2 (two) times daily.   spironolactone 50 MG tablet Commonly known as:  ALDACTONE TAKE 2 TABLETS BY MOUTH EVERY DAY   thiamine 100 MG tablet Commonly known as:  VITAMIN B-1 Take 1 tablet (100 mg total) by mouth daily.       Allergies: No Known Allergies  Family History: Family History  Problem Relation Age of Onset  . Stroke Mother   . Heart disease Mother     . [name] Father   . Breast cancer Sister 21  . Kidney disease Neg Hx   . Bladder Cancer Neg Hx   . Kidney cancer Neg Hx   . Prostate cancer Neg Hx     Social History:  reports that she has been smoking cigarettes.  She has a 42.00 pack-year smoking history. she has never used smokeless tobacco. She reports that she does not drink alcohol or use drugs.  ROS: UROLOGY Frequent Urination?: No Hard to postpone urination?: No Burning/pain with urination?: No Get up at night to urinate?: Yes Leakage of urine?: No Urine stream starts and stops?: No Trouble starting stream?: No Do you have to strain to urinate?: No Blood in urine?: No Urinary  tract infection?: No Sexually transmitted disease?: No Injury to kidneys or bladder?: No Painful intercourse?: No Weak stream?: No Currently pregnant?: No Vaginal bleeding?: No Last menstrual period?: n  Gastrointestinal Nausea?: Yes Vomiting?: Yes Indigestion/heartburn?: Yes Diarrhea?: No Constipation?: No  Constitutional Fever: No Night sweats?: No Weight loss?: No Fatigue?: Yes  Skin Skin rash/lesions?: No Itching?: No  Eyes Blurred vision?: No Double vision?: No  Ears/Nose/Throat Sore throat?: No Sinus problems?: No  Hematologic/Lymphatic Swollen glands?: No Easy bruising?: Yes  Cardiovascular Leg swelling?: No Chest pain?: No  Respiratory Cough?: No Shortness of breath?: Yes  Endocrine Excessive thirst?: Yes  Musculoskeletal Back pain?: Yes Joint pain?: No  Neurological Headaches?: No Dizziness?: No  Psychologic Depression?: Yes Anxiety?: Yes  Physical Exam: BP 122/78 (BP Location: Left Arm, Patient Position: Sitting, Cuff Size: Normal)   Pulse (!) 109   Ht 5\' 9"  (1.753 m)   Wt 186 lb 9.6 oz (84.6 kg)   BMI 27.56 kg/m   No acute distress.  Alert and oriented.  Accompanied by husband today. Abdomen distended with fluid wave no CVA tenderness No skin lesions No respiratory distress, no  increased work of breathing Normocephalic atraumatic Neurologically intact without any focal deficits No lower extremity edema  Laboratory Data: Lab Results  Component Value Date   WBC 8.1 04/07/2017   HGB 12.9 04/07/2017   HCT 38.1 04/07/2017   MCV 109.0 (H) 04/07/2017   PLT 135 (L) 04/07/2017    Lab Results  Component Value Date   CREATININE 0.70 04/04/2017    Lab Results  Component Value Date   HGBA1C 5.3 05/22/2015    Pertinent Imaging: CLINICAL DATA:  Right lung cancer. Renal mass. Anemia. Shortness of breath and cough.  EXAM: CT CHEST WITH CONTRAST  CT ABDOMEN WITH AND WITHOUT CONTRAST  TECHNIQUE: Multidetector CT imaging of the chest was performed during intravenous contrast administration. Multidetector CT imaging of the abdomen was performed following the standard protocol before and during bolus administration of intravenous contrast.  CONTRAST:  134mL ISOVUE-370 IOPAMIDOL (ISOVUE-370) INJECTION 76%  COMPARISON:  Multiple exams, including PET-CT from 08/11/2016; MRI abdomen from 10/20/2016; and CT chest from 11/01/2016  FINDINGS: CT CHEST FINDINGS  Cardiovascular: Atherosclerotic calcification of the aortic arch and branch vessels.  Mild heterogeneity of pulmonary arterial density noted, likely incidental and less likely to be due to chronic pulmonary embolus. For example, the linear lucency in the right middle lobe pulmonary artery on image 84/2 probably represents streak artifact from the spinal hardware rather than true pulmonary embolus. I do not see any central filling defect within the pulmonary arterial tree to further suggest acute pulmonary embolus. Clearly today's exam was performed as a CT of the chest rather than a CT angiogram of the chest and may have reduced sensitivity and specificity because of this.  Mediastinum/Nodes: No pathologic thoracic adenopathy. Esophagus unremarkable.  Lungs/Pleura: The dominant spiculated  right upper lobe pulmonary nodule measures 1.4 by 1.2 cm on image 77/3, previously 1.5 by 1.1 cm hands essentially stable.  Bandlike nodularity in the left upper lobe measures 0.7 by 0.4 cm on image 28/3, stable by my measurements.  Stable bilateral ground-glass density nodules are scattered in both lungs. For example an index lesion in the apical segment right upper lobe measures 2.0 by 1.7 cm (formerly the same).  Musculoskeletal: Thoracic posterolateral rod and pedicle screw fixators noted without complicating feature.  CT ABDOMEN FINDINGS  Hepatobiliary: Diffuse hepatic steatosis. Nodularity of hepatic contour compatible with cirrhosis. Multiple layering gallstones in  the gallbladder. No appreciable biliary dilatation. No liver mass identified.  Pancreas: Unremarkable  Spleen: Unremarkable  Adrenals/Urinary Tract: Solid enhancing right kidney upper pole mass measures 2.5 by 2.6 by 2.4 cm (volume = 8.2 cm^3). This previously measured 2.2 by 2.2 by 2.2 cm (volume = 5.6 cm^3) on 10/20/2016.  Mild scarring of the right mid kidney. Several additional tiny hypodense renal lesions are technically too small to characterize although statistically likely to be benign.  Stomach/Bowel: Unremarkable  Vascular/Lymphatic: Aortoiliac atherosclerotic vascular disease. Portacaval node 1.2 cm in short axis on image 41/12, stable. Borderline enlarged right retrocaval node on image 46/12 at 1.0 cm in diameter, previously 1.1 cm on prior PET-CT of 08/11/2016. No tumor thrombus in the right renal vein.  Other: Mild stranding along the inferior margins of both paracolic gutters.  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 No specific complicating feature is identified.  IMPRESSION: 1. Enlarging solid  enhancing mass of the right kidney upper pole, currently 2.6 cm in long axis, favoring renal cell carcinoma. 2. Stable size of the dominant spiculated right upper lobe pulmonary nodule favoring lung cancer. 3. Scattered stable ground-glass densities in the lungs, technically nonspecific, but multifocal low-grade adenocarcinoma is not excluded. 4. Hepatic cirrhosis with diffuse hepatic steatosis and cholelithiasis. 5. Portacaval lymph node 1.2 cm, likely reactive. A right retrocaval lymph node has slightly reduced in size over the last 7 months and currently is borderline enlarged at 1.0 cm in diameter. 6. Thoracolumbar fusion. 7. Mild heterogeneity of density in the pulmonary arteries with appearance favoring artifact over mild chronic pulmonary embolus. No findings of acute pulmonary embolus.   Electronically Signed   By: Van Clines M.D.   On: 04/04/2017 11:57  CT scan personally reviewed today and with the patient.  There is been interval growth of her right upper pole lesion.  Assessment & Plan:  57 year old female with multiple medical problems with a 17 mm enhancing right upper pole renal mass, increased from 1 cm in 2012.  1. Right renal mass Right upper pole possibly enhancing renal mass, intimally very slow interval growth since 2012 but more rapid acceleration in growth over the past 71-month interval.  At this point in time, I urged her more strongly consider intervention including although continued surveillance was also discussed.  Given her multiple medical comorbidities, I do not feel that she is a great surgical candidate and she agrees that she is not interested in surgery.  She may be interested in cryoablation at this point time.  We will refer her to see interventional radiology to discuss this further.  We did briefly discussed the procedure, postop course, as well as risk of recurrence.  All of her questions were answered at this time.  She is seeing Dr.  Janese Banks later today.  I will communicate my plan with her as well.    If she does not pursue cryoablation, I would recommend continued surveillance on a every 6 month interval.   Hollice Espy, MD  [address] [place], [place] [address] [phone]  I spent 25 min with this patient of which greater than 50% was spent in counseling and coordination of care with the patient.

## 2017-04-08 NOTE — Progress Notes (Signed)
Hematology/Oncology Consult note [facility]  [phone] [phone]  Patient Care Team: Jodi Marble, MD as PCP - General (Internal Medicine)   Name of the patient: Audrey Garrett  [id number]  09/03/1960   Date of visit: 04/08/17  Diagnosis- 1. Iron deficiency anemia  2. Renal cell carcinoma  3. Stage I lung cancer [id number]  Chief complaint/ Reason for visit- discuss CT scan results  Heme/Onc history: Patient is a 57 year old female with a h/o liver cirrhosis and is currently receiving Harvoni for hepatitis C. GI evaluation is as follows:  Upper EGD on 04/01/16 showed multiple dispersed diminutive erosions were found in the gastric antrum. There were no stigmata of recent bleeding. One healing cratered gastric ulcer with no stigmata of bleeding was found in the prepyloric region of the stomach. The lesion was 2 mm in largest dimension. Colonoscopy on 04/01/16 showed multiple small-mouthed diverticula were found in the sigmoid colon and descending colon. A single small localized angioectasia with typical arborization was found in the proximal ascending colon. Coagulation for tissue destruction using argon plasma at 0.5 liters/minute and 20 watts was successful.  She has received significant blood transfusions recently since March 2017. Her last blood transfusion was on 03/17/16. She had 2 units of blood She received 4 doses of IV Venofer  In Jan 2018 she received blood trasnsfusion followed by ferraheme.   During her visit in February 2018 her H&H was 10.3/ 31.6. IV iron was held off and she was supposed to get cbc checked in1 month. Patient missed that appointment and became progressively iron deficient again when her hb dropped to 7.9. She received 1 unit of blood transfusion  Patient continued to feel fatigued despite improvement in her anemia. Given her strong h/o smoking CT lung cancer screenign was obtained which  showed: IMPRESSION: 11.6 mm spiculated right lung nodule just anterior to the major fissure with associated fissure all retraction. Lung-RADS Category 4A, suspicious. Follow up low-dose chest CT without contrast in 3 months (please use the following order, "CT CHEST LCS NODULE FOLLOW-UP W/O CM") is recommended. Alternatively, PET may be considered when there is a solid component 55mm or larger.  Other bilateral pulmonary nodules evident. Attention on follow-up.   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 She did not wish to pursue surgery  Interval history-reports feeling tired all the time.  Her abdomen feels distended and feels like she needs to get another ascites tap.  She continues to smoke and is not interested in quitting at this time  ECOG PS- 1 Pain scale- 4  Review of systems- Review of Systems  Constitutional: Positive for malaise/fatigue. Negative for chills, fever and weight loss.  HENT: Negative for congestion, ear discharge and nosebleeds.   Eyes: Negative for blurred vision.  Respiratory: Negative for cough, hemoptysis, sputum production, shortness of breath and wheezing.   Cardiovascular: Negative for chest pain, palpitations, orthopnea and claudication.  Gastrointestinal: Negative for abdominal pain, blood in stool, constipation, diarrhea, heartburn, melena, nausea and vomiting.       Abdominal distention positive  Genitourinary: Negative for dysuria, flank pain, frequency, hematuria and urgency.  Musculoskeletal: Negative for back pain, joint pain and myalgias.  Skin: Negative for rash.  Neurological: Negative for dizziness, tingling, focal weakness, seizures, weakness and headaches.  Endo/Heme/Allergies: Does not bruise/bleed easily.  Psychiatric/Behavioral: Negative for depression and suicidal ideas.  The patient does not have insomnia.       No Known Allergies   Past Medical History:  Diagnosis Date  . Alcohol abuse   . Alcoholic cirrhosis of liver without ascites ([place])   . Anemia   . Anxiety   . Arthropathy   . Back pain   . Bronchitis   . BRONCHITIS, ACUTE 11/01/2009   Qualifier: Diagnosis of  By: Alveta Heimlich MD, Cornelia Copa    . Chronic hepatitis C ([place])   . Chronic LBP 12/25/2014   Overview:  Post extensive surgery   . Cirrhosis ([place])   . DDD (degenerative disc disease), lumbar 01/01/2015  . Depression   . Diabetes mellitus without complication ([place])   . Diverticulosis   . Edema   . Fatigue   . Fibromyalgia   . GERD (gastroesophageal reflux disease)    gastroparesis  . High risk medications (not anticoagulants) long-term use   . Hyperglycemia   . Hypertension   . Kidney mass    11/17 small mass-  . Left leg pain 01/01/2015  . Lumbago   . Lumbar radicular pain 01/01/2015  . Muscle spasm   . Neck pain 01/01/2015  . Polyarthritis   . Reflux   . Restless leg syndrome    01/2016  . RLS (restless legs syndrome)   . Sacroiliac pain 01/01/2015  . Shoulder pain   . Sinusitis   . SOB (shortness of breath)   . Spine disorder   . Stress headaches   . Upper back pain 01/01/2015  . Urinary frequency   . Vitamin D deficiency disease      Past Surgical History:  Procedure Laterality Date  . BACK SURGERY  12/19/2011  . BACK SURGERY    . COLONOSCOPY WITH PROPOFOL N/A 09/25/2015   Procedure: COLONOSCOPY WITH PROPOFOL;  Surgeon: Lollie Sails, MD;  Location[facility] ENDOSCOPY;  Service: Endoscopy;  Laterality: N/A;  . COLONOSCOPY WITH PROPOFOL N/A 04/01/2016   Procedure: COLONOSCOPY WITH PROPOFOL;  Surgeon: Lollie Sails, MD;  Location: [facility] ENDOSCOPY;  Service: Endoscopy;  Laterality: N/A;  . ELECTROMAGNETIC NAVIGATION BROCHOSCOPY N/A 11/25/2016   Procedure: ELECTROMAGNETIC NAVIGATION BRONCHOSCOPY;  Surgeon: Flora Lipps, MD;  Location: [facility] ORS;  Service: Cardiopulmonary;  Laterality: N/A;  . ESOPHAGOGASTRODUODENOSCOPY  N/A 04/14/2015   Procedure: ESOPHAGOGASTRODUODENOSCOPY (EGD);  Surgeon: Lollie Sails, MD;  Location: [facility] ENDOSCOPY;  Service: Endoscopy;  Laterality: N/A;  . ESOPHAGOGASTRODUODENOSCOPY (EGD) WITH PROPOFOL N/A 09/16/2015   Procedure: ESOPHAGOGASTRODUODENOSCOPY (EGD) WITH PROPOFOL;  Surgeon: Lollie Sails, MD;  Location: [facility] ENDOSCOPY;  Service: Endoscopy;  Laterality: N/A;  . ESOPHAGOGASTRODUODENOSCOPY (EGD) WITH PROPOFOL N/A 01/22/2016   Procedure: ESOPHAGOGASTRODUODENOSCOPY (EGD) WITH PROPOFOL;  Surgeon: [name], MD;  Location: [place];  Service: Endoscopy;  Laterality: N/A;  . ESOPHAGOGASTRODUODENOSCOPY (EGD) WITH PROPOFOL N/A 04/01/2016   Procedure: ESOPHAGOGASTRODUODENOSCOPY (EGD) WITH PROPOFOL;  Surgeon: Lollie Sails, MD;  Location: [facility] ENDOSCOPY;  Service: Endoscopy;  Laterality: N/A;  . HERNIA REPAIR N/A 10/2015   abdominal   . TONSILLECTOMY    . TUBAL LIGATION    . UMBILICAL HERNIA REPAIR N/A 11/06/2015   Procedure: HERNIA REPAIR UMBILICAL ADULT;  Surgeon: [name], MD;  Location: [facility] ORS;  Service: General;  Laterality: N/A;    Social History   Socioeconomic History  . Marital status: Married    Spouse name: Not on file  . Number of children: Not on file  . Years of education: Not on file  . Highest education level: Not on  file  Social Needs  . Financial resource strain: Not on file  . Food insecurity - worry: Not on file  . Food insecurity - inability: Not on file  . Transportation needs - medical: Not on file  . Transportation needs - non-medical: Not on file  Occupational History  . Not on file  Tobacco Use  . Smoking status: Current Every Day Smoker    Packs/day: 1.00    Years: 42.00    Pack years: 42.00    Types: Cigarettes  . Smokeless tobacco: Never Used  . Tobacco comment: 2ppd until last 2 years  Substance and Sexual Activity  . Alcohol use: No    Alcohol/week: 0.0 oz    Comment: stopped drinking 05/12/15  . Drug use: No   . Sexual activity: Not on file  Other Topics Concern  . Not on file  Social History Narrative   Lives at home with husband    Family History  Problem Relation Age of Onset  . Stroke Mother   . Heart disease Mother   . [name] Father   . Breast cancer Sister 64  . Kidney disease Neg Hx   . Bladder Cancer Neg Hx   . Kidney cancer Neg Hx   . Prostate cancer Neg Hx      Current Outpatient Medications:  .  furosemide (LASIX) 40 MG tablet, Take 40 mg by mouth 2 (two) times daily. , Disp: , Rfl:  .  lactulose (CHRONULAC) 10 GM/15ML solution, Take 10 g by mouth 3 (three) times daily as needed for mild constipation or moderate constipation. , Disp: , Rfl:  .  magnesium oxide (MAG-OX) 400 MG tablet, Take 400 mg by mouth daily., Disp: , Rfl:  .  metFORMIN (GLUCOPHAGE) 500 MG tablet, Take 500 mg by mouth 3 (three) times daily after meals., Disp: , Rfl:  .  Multiple Vitamins-Minerals (CENTRUM SILVER 50+WOMEN PO), Take 1 tablet by mouth daily., Disp: , Rfl:  .  [START ON 05/17/2017] Oxycodone HCl 20 MG TABS, Take 1 tablet (20 mg total) by mouth every 6 (six) hours., Disp: 120 tablet, Rfl: 0 .  pantoprazole (PROTONIX) 40 MG tablet, take once DAILY., Disp: , Rfl: 11 .  PARoxetine (PAXIL) 40 MG tablet, Take 40 mg by mouth every morning., Disp: , Rfl: 2 .  pregabalin (LYRICA) 100 MG capsule, Take 100 mg by mouth 2 (two) times daily., Disp: , Rfl:  .  spironolactone (ALDACTONE) 50 MG tablet, TAKE 2 TABLETS BY MOUTH EVERY DAY, Disp: , Rfl:  .  thiamine (VITAMIN B-1) 100 MG tablet, Take 1 tablet (100 mg total) by mouth daily., Disp: 90 tablet, Rfl: 6  Physical exam:  Vitals:   04/07/17 1409  BP: 110/75  Pulse: 87  Resp: 20  Temp: 98.9 F (37.2 C)  TempSrc: Tympanic  Weight: 186 lb 14.4 oz (84.8 kg)   Physical Exam  Constitutional: She is oriented to person, place, and time and well-developed, well-nourished, and in no distress.  HENT:  Head: Normocephalic and atraumatic.  Eyes: EOM are  normal. Pupils are equal, round, and reactive to light.  Neck: Normal range of motion.  Cardiovascular: Normal rate, regular rhythm and normal heart sounds.  Pulmonary/Chest: Effort normal and breath sounds normal.  Abdominal: Bowel sounds are normal.  Distended.  Ascites positive  Musculoskeletal: She exhibits edema (Trace bilateral).  Neurological: She is alert and oriented to person, place, and time.  Skin: Skin is warm and dry.     CMP Latest Ref  Rng & Units 04/04/2017  Glucose 65 - 99 mg/dL -  BUN 6 - 20 mg/dL -  Creatinine 0.44 - 1.00 mg/dL 0.70  Sodium 135 - 145 mmol/L -  Potassium 3.5 - 5.1 mmol/L -  Chloride 101 - 111 mmol/L -  CO2 22 - 32 mmol/L -  Calcium 8.9 - 10.3 mg/dL -  Total Protein 6.5 - 8.1 g/dL -  Total Bilirubin 0.3 - 1.2 mg/dL -  Alkaline Phos 38 - 126 U/L -  AST 15 - 41 U/L -  ALT 14 - 54 U/L -   CBC Latest Ref Rng & Units 04/07/2017  WBC 3.6 - 11.0 K/uL 8.1  Hemoglobin 12.0 - 16.0 g/dL 12.9  Hematocrit 35.0 - 47.0 % 38.1  Platelets 150 - 440 K/uL 135(L)    No images are attached to the encounter.  Ct Chest W Contrast  Result Date: 04/04/2017 CLINICAL DATA:  Right lung cancer. Renal mass. Anemia. Shortness of breath and cough. EXAM: CT CHEST WITH CONTRAST CT ABDOMEN WITH AND WITHOUT CONTRAST TECHNIQUE: Multidetector CT imaging of the chest was performed during intravenous contrast administration. Multidetector CT imaging of the abdomen was performed following the standard protocol before and during bolus administration of intravenous contrast. CONTRAST:  158mL ISOVUE-370 IOPAMIDOL (ISOVUE-370) INJECTION 76% COMPARISON:  Multiple exams, including PET-CT from 08/11/2016; MRI abdomen from 10/20/2016; and CT chest from 11/01/2016 FINDINGS: CT CHEST FINDINGS Cardiovascular: Atherosclerotic calcification of the aortic arch and branch vessels. Mild heterogeneity of pulmonary arterial density noted, likely incidental and less likely to be due to chronic pulmonary  embolus. For example, the linear lucency in the right middle lobe pulmonary artery on image 84/2 probably represents streak artifact from the spinal hardware rather than true pulmonary embolus. I do not see any central filling defect within the pulmonary arterial tree to further suggest acute pulmonary embolus. Clearly today's exam was performed as a CT of the chest rather than a CT angiogram of the chest and may have reduced sensitivity and specificity because of this. Mediastinum/Nodes: No pathologic thoracic adenopathy. Esophagus unremarkable. Lungs/Pleura: The dominant spiculated right upper lobe pulmonary nodule measures 1.4 by 1.2 cm on image 77/3, previously 1.5 by 1.1 cm hands essentially stable. Bandlike nodularity in the left upper lobe measures 0.7 by 0.4 cm on image 28/3, stable by my measurements. Stable bilateral ground-glass density nodules are scattered in both lungs. For example an index lesion in the apical segment right upper lobe measures 2.0 by 1.7 cm (formerly the same). Musculoskeletal: Thoracic posterolateral rod and pedicle screw fixators noted without complicating feature. CT ABDOMEN FINDINGS Hepatobiliary: Diffuse hepatic steatosis. Nodularity of hepatic contour compatible with cirrhosis. Multiple layering gallstones in the gallbladder. No appreciable biliary dilatation. No liver mass identified. Pancreas: Unremarkable Spleen: Unremarkable Adrenals/Urinary Tract: Solid enhancing right kidney upper pole mass measures 2.5 by 2.6 by 2.4 cm (volume = 8.2 cm^3). This previously measured 2.2 by 2.2 by 2.2 cm (volume = 5.6 cm^3) on 10/20/2016. Mild scarring of the right mid kidney. Several additional tiny hypodense renal lesions are technically too small to characterize although statistically likely to be benign. Stomach/Bowel: Unremarkable Vascular/Lymphatic: Aortoiliac atherosclerotic vascular disease. Portacaval node 1.2 cm in short axis on image 41/12, stable. Borderline enlarged right  retrocaval node on image 46/12 at 1.0 cm in diameter, previously 1.1 cm on prior PET-CT of 08/11/2016. No tumor thrombus in the right renal vein. Other: Mild stranding along the inferior margins of both paracolic gutters. 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 No specific complicating feature is identified. IMPRESSION: 1. Enlarging solid enhancing mass of the right kidney upper pole, currently 2.6 cm in long axis, favoring renal cell carcinoma. 2. Stable size of the dominant spiculated right upper lobe pulmonary nodule favoring lung cancer. 3. Scattered stable ground-glass densities in the lungs, technically nonspecific, but multifocal low-grade adenocarcinoma is not excluded. 4. Hepatic cirrhosis with diffuse hepatic steatosis and cholelithiasis. 5. Portacaval lymph node 1.2 cm, likely reactive. A right retrocaval lymph node has slightly reduced in size over the last 7 months and currently is borderline enlarged at 1.0 cm in diameter. 6. Thoracolumbar fusion. 7. Mild heterogeneity of density in the pulmonary arteries with appearance favoring artifact over mild chronic pulmonary embolus. No findings of acute pulmonary embolus. Electronically Signed   By: Van Clines M.D.   On: 04/04/2017 11:57   Ct Abd Wo & W Cm  Result Date: 04/04/2017 CLINICAL DATA:  Right lung cancer. Renal mass. Anemia. Shortness of breath and cough. EXAM: CT CHEST WITH CONTRAST CT ABDOMEN WITH AND WITHOUT CONTRAST TECHNIQUE: Multidetector CT imaging of the chest was performed during intravenous contrast administration. Multidetector CT imaging of the abdomen was performed following the standard protocol before and during bolus administration of intravenous contrast. CONTRAST:  187mL ISOVUE-370 IOPAMIDOL (ISOVUE-370) INJECTION 76% COMPARISON:  Multiple exams,  including PET-CT from 08/11/2016; MRI abdomen from 10/20/2016; and CT chest from 11/01/2016 FINDINGS: CT CHEST FINDINGS Cardiovascular: Atherosclerotic calcification of the aortic arch and branch vessels. Mild heterogeneity of pulmonary arterial density noted, likely incidental and less likely to be due to chronic pulmonary embolus. For example, the linear lucency in the right middle lobe pulmonary artery on image 84/2 probably represents streak artifact from the spinal hardware rather than true pulmonary embolus. I do not see any central filling defect within the pulmonary arterial tree to further suggest acute pulmonary embolus. Clearly today's exam was performed as a CT of the chest rather than a CT angiogram of the chest and may have reduced sensitivity and specificity because of this. Mediastinum/Nodes: No pathologic thoracic adenopathy. Esophagus unremarkable. Lungs/Pleura: The dominant spiculated right upper lobe pulmonary nodule measures 1.4 by 1.2 cm on image 77/3, previously 1.5 by 1.1 cm hands essentially stable. Bandlike nodularity in the left upper lobe measures 0.7 by 0.4 cm on image 28/3, stable by my measurements. Stable bilateral ground-glass density nodules are scattered in both lungs. For example an index lesion in the apical segment right upper lobe measures 2.0 by 1.7 cm (formerly the same). Musculoskeletal: Thoracic posterolateral rod and pedicle screw fixators noted without complicating feature. CT ABDOMEN FINDINGS Hepatobiliary: Diffuse hepatic steatosis. Nodularity of hepatic contour compatible with cirrhosis. Multiple layering gallstones in the gallbladder. No appreciable biliary dilatation. No liver mass identified. Pancreas: Unremarkable Spleen: Unremarkable Adrenals/Urinary Tract: Solid enhancing right kidney upper pole mass measures 2.5 by 2.6 by 2.4 cm (volume = 8.2 cm^3). This previously measured 2.2 by 2.2 by 2.2 cm (volume = 5.6 cm^3) on 10/20/2016. Mild scarring of the right mid  kidney. Several additional tiny hypodense renal lesions are technically too small to characterize although statistically likely to be benign. Stomach/Bowel: Unremarkable Vascular/Lymphatic: Aortoiliac atherosclerotic vascular disease. Portacaval node 1.2 cm in short axis on image 41/12, stable. Borderline enlarged right retrocaval node on image 46/12 at 1.0 cm in diameter, previously 1.1 cm on prior PET-CT of 08/11/2016. No tumor thrombus in the right  renal vein. Other: Mild stranding along the inferior margins of both paracolic gutters. 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 No specific complicating feature is identified. IMPRESSION: 1. Enlarging solid enhancing mass of the right kidney upper pole, currently 2.6 cm in long axis, favoring renal cell carcinoma. 2. Stable size of the dominant spiculated right upper lobe pulmonary nodule favoring lung cancer. 3. Scattered stable ground-glass densities in the lungs, technically nonspecific, but multifocal low-grade adenocarcinoma is not excluded. 4. Hepatic cirrhosis with diffuse hepatic steatosis and cholelithiasis. 5. Portacaval lymph node 1.2 cm, likely reactive. A right retrocaval lymph node has slightly reduced in size over the last 7 months and currently is borderline enlarged at 1.0 cm in diameter. 6. Thoracolumbar fusion. 7. Mild heterogeneity of density in the pulmonary arteries with appearance favoring artifact over mild chronic pulmonary embolus. No findings of acute pulmonary embolus. Electronically Signed   By: Van Clines M.D.   On: 04/04/2017 11:57     Assessment and plan- Patient is a 57 y.o. female who sees me for following medical issues:  1.  Stage I lung cancer: Status post SBR T.  I personally reviewed the CT thorax images and I discussed the findings with her.   Right upper lobe pulmonary nodule is stable in size.  She is also noted to have other nonspecific groundglass opacities which have been stable.  Right kidney mass was found to be enlarging and currently measuring 2.6 cm.  Portocaval lymph node was 1.2 cm likely reactive and reduced in size over the last 7 months.  At this point I will plan to we will repeat her CT scan in about 6 months time but I will discuss her images at the tumor board next week if CT scan needs to be done any sooner  2.  Enlarging right kidney mass: I did get in touch with Dr. Erlene Quan and she has given the patient the option of cryoablation versus a repeat CT scan in 6 months time  3.  Iron deficiency anemia:Currently patient is not anemic and her iron studies are within normal limits.  She does not require Feraheme at this time  4.  Ongoing fatigue, ascites possibly secondary to underlying cirrhosis.  I have asked her to discuss this with Dr. Gustavo Lah regarding need for repeat paracentesis.  She does have significant risk of HCC given her underlying cirrhosis.  The last MRI abdomen from July 2018 did not reveal any abnormal enhancing lesions in the liver and this is being monitored by GI  Repeat CBC ferritin and iron studies in 3 months in 6 months and I will see her back in 6 months with a CT thorax prior   Visit Diagnosis 1. Malignant neoplasm of upper lobe of right lung ([place])   2. Iron deficiency anemia, unspecified iron deficiency anemia type   3. Right kidney mass      Dr. Randa Evens, MD, MPH [facility] Pager- [phone] 04/08/2017 8:09 AM

## 2017-04-10 ENCOUNTER — Other Ambulatory Visit: Payer: Self-pay | Admitting: *Deleted

## 2017-04-13 ENCOUNTER — Encounter: Payer: Self-pay | Admitting: *Deleted

## 2017-04-13 ENCOUNTER — Ambulatory Visit
Admission: RE | Admit: 2017-04-13 | Discharge: 2017-04-13 | Disposition: A | Discharge status: Home / Self Care | Payer: Medicare Other | Source: Ambulatory Visit | Attending: Urology | Admitting: Urology

## 2017-04-13 ENCOUNTER — Ambulatory Visit: Payer: Medicare Other

## 2017-04-13 DIAGNOSIS — N2889 Other specified disorders of kidney and ureter: Secondary | ICD-10-CM

## 2017-04-13 HISTORY — PX: IR RADIOLOGIST EVAL & MGMT: IMG5224

## 2017-04-13 NOTE — Consult Note (Signed)
Chief Complaint: Patient was seen in consultation today for  Chief Complaint  Patient presents with  . Advice Only    Consult for Cryoablation of Right Renal Mass   at the request of [place]  Referring Physician(s): Brandon,Ashley  History of Present Illness: Audrey Garrett is a 57 y.o. female with a complex medical history including lung cancer, cirrhosis and enlarging right renal lesion. Patient was diagnosed with stage I right lung cancer and was treated with SBRT. Patient has a suspicious enhancing lesion in the right kidney upper pole which has been followed for a few years. Most recent cross-sectional imaging demonstrated further enlargement of this lesion. Patient has been consulted by urology and given the option of surveillance versus percutaneous ablation. [name] past medical history is also significant for hepatitis C and cirrhosis. Hepatitis C has been treated with Harvani. Currently, the patient is doing well from a medical standpoint. She feels good and has not needed a paracentesis for approximately 6 months. Patient had multiple paracentesis in the past. Patient denies dysuria, hematuria or flank pain. Patient is still smoking half a pack a day. She has no significant breathing complaints or chest pain at this time. Patient denies alcohol use at this time.  Past Medical History:  Diagnosis Date  . Alcohol abuse   . Alcoholic cirrhosis of liver without ascites ([place])   . Anemia   . Anxiety   . Arthropathy   . Back pain   . Bronchitis   . BRONCHITIS, ACUTE 11/01/2009   Qualifier: Diagnosis of  By: Alveta Heimlich MD, Cornelia Copa    . Chronic hepatitis C ([place])   . Chronic LBP 12/25/2014   Overview:  Post extensive surgery   . Cirrhosis ([place])   . DDD (degenerative disc disease), lumbar 01/01/2015  . Depression   . Diabetes mellitus without complication ([place])   . Diverticulosis   . Edema   . Fatigue   . Fibromyalgia   . GERD (gastroesophageal reflux disease)    gastroparesis  . High risk medications (not anticoagulants) long-term use   . Hyperglycemia   . Hypertension   . Kidney mass    11/17 small mass-  . Left leg pain 01/01/2015  . Lumbago   . Lumbar radicular pain 01/01/2015  . Muscle spasm   . Neck pain 01/01/2015  . Polyarthritis   . Reflux   . Restless leg syndrome    01/2016  . RLS (restless legs syndrome)   . Sacroiliac pain 01/01/2015  . Shoulder pain   . Sinusitis   . SOB (shortness of breath)   . Spine disorder   . Stress headaches   . Upper back pain 01/01/2015  . Urinary frequency   . Vitamin D deficiency disease     Past Surgical History:  Procedure Laterality Date  . BACK SURGERY  12/19/2011  . BACK SURGERY    . COLONOSCOPY WITH PROPOFOL N/A 09/25/2015   Procedure: COLONOSCOPY WITH PROPOFOL;  Surgeon: Lollie Sails, MD;  Location: [facility] ENDOSCOPY;  Service: Endoscopy;  Laterality: N/A;  . COLONOSCOPY WITH PROPOFOL N/A 04/01/2016   Procedure: COLONOSCOPY WITH PROPOFOL;  Surgeon: Lollie Sails, MD;  Location: [facility] ENDOSCOPY;  Service: Endoscopy;  Laterality: N/A;  . ELECTROMAGNETIC NAVIGATION BROCHOSCOPY N/A 11/25/2016   Procedure: ELECTROMAGNETIC NAVIGATION BRONCHOSCOPY;  Surgeon: Flora Lipps, MD;  Location: [facility] ORS;  Service: Cardiopulmonary;  Laterality: N/A;  . ESOPHAGOGASTRODUODENOSCOPY N/A 04/14/2015   Procedure: ESOPHAGOGASTRODUODENOSCOPY (EGD);  Surgeon: Lollie Sails, MD;  Location: [facility] ENDOSCOPY;  Service: Endoscopy;  Laterality: N/A;  . ESOPHAGOGASTRODUODENOSCOPY (EGD) WITH PROPOFOL N/A 09/16/2015   Procedure: ESOPHAGOGASTRODUODENOSCOPY (EGD) WITH PROPOFOL;  Surgeon: Lollie Sails, MD;  Location: [facility] ENDOSCOPY;  Service: Endoscopy;  Laterality: N/A;  . ESOPHAGOGASTRODUODENOSCOPY (EGD) WITH PROPOFOL N/A 01/22/2016   Procedure: ESOPHAGOGASTRODUODENOSCOPY (EGD) WITH PROPOFOL;  Surgeon: [name], MD;  Location: [place];  Service: Endoscopy;  Laterality: N/A;  . ESOPHAGOGASTRODUODENOSCOPY  (EGD) WITH PROPOFOL N/A 04/01/2016   Procedure: ESOPHAGOGASTRODUODENOSCOPY (EGD) WITH PROPOFOL;  Surgeon: Lollie Sails, MD;  Location: [facility] ENDOSCOPY;  Service: Endoscopy;  Laterality: N/A;  . HERNIA REPAIR N/A 10/2015   abdominal   . IR RADIOLOGIST EVAL & MGMT  04/13/2017  . TONSILLECTOMY    . TUBAL LIGATION    . UMBILICAL HERNIA REPAIR N/A 11/06/2015   Procedure: HERNIA REPAIR UMBILICAL ADULT;  Surgeon: [name], MD;  Location: [facility] ORS;  Service: General;  Laterality: N/A;    Allergies: Patient has no known allergies.  Medications: Prior to Admission medications   Medication Sig Start Date End Date Taking? Authorizing Provider  furosemide (LASIX) 40 MG tablet Take 40 mg by mouth 2 (two) times daily.  10/13/15   [provider]  lactulose (CHRONULAC) 10 GM/15ML solution Take 10 g by mouth 3 (three) times daily as needed for mild constipation or moderate constipation.     [provider]  magnesium oxide (MAG-OX) 400 MG tablet Take 400 mg by mouth daily.    [provider]  metFORMIN (GLUCOPHAGE) 500 MG tablet Take 500 mg by mouth 3 (three) times daily after meals.    [provider]  Multiple Vitamins-Minerals (CENTRUM SILVER 50+WOMEN PO) Take 1 tablet by mouth daily.    [provider]  Oxycodone HCl 20 MG TABS Take 1 tablet (20 mg total) by mouth every 6 (six) hours. 05/17/17 06/16/17  Vevelyn Francois, NP  pantoprazole (PROTONIX) 40 MG tablet take once DAILY. 07/29/16   [provider]  PARoxetine (PAXIL) 40 MG tablet Take 40 mg by mouth every morning. 01/20/16   [provider]  pregabalin (LYRICA) 100 MG capsule Take 100 mg by mouth 2 (two) times daily.    [provider]  spironolactone (ALDACTONE) 50 MG tablet TAKE 2 TABLETS BY MOUTH EVERY DAY 09/08/16   [provider]  thiamine (VITAMIN B-1) 100 MG tablet Take 1 tablet (100 mg total) by mouth daily. 02/11/16   Creola Corn, MD     Family  History  Problem Relation Age of Onset  . Stroke Mother   . Heart disease Mother   . [name] Father   . Breast cancer Sister 36  . Kidney disease Neg Hx   . Bladder Cancer Neg Hx   . Kidney cancer Neg Hx   . Prostate cancer Neg Hx     Social History   Socioeconomic History  . Marital status: Married    Spouse name: Not on file  . Number of children: Not on file  . Years of education: Not on file  . Highest education level: Not on file  Social Needs  . Financial resource strain: Not on file  . Food insecurity - worry: Not on file  . Food insecurity - inability: Not on file  . Transportation needs - medical: Not on file  . Transportation needs - non-medical: Not on file  Occupational History  . Not on file  Tobacco Use  . Smoking status: Current Every Day Smoker    Packs/day: 1.00  Years: 42.00    Pack years: 42.00    Types: Cigarettes  . Smokeless tobacco: Never Used  . Tobacco comment: 2ppd until last 2 years  Substance and Sexual Activity  . Alcohol use: No    Alcohol/week: 0.0 oz    Comment: stopped drinking 05/12/15  . Drug use: No  . Sexual activity: Not on file  Other Topics Concern  . Not on file  Social History Narrative   Lives at home with husband      Review of Systems: A 12 point ROS discussed and pertinent positives are indicated in the HPI above.  All other systems are negative.  Review of Systems  Constitutional: Positive for unexpected weight change. Negative for appetite change and fatigue.  Respiratory: Negative.   Cardiovascular: Negative.   Gastrointestinal: Negative.   Genitourinary: Negative.     Vital Signs: BP 117/76   Pulse 87   Temp 98.6 F (37 C) (Oral)   Resp 15   Ht 5\' 10"  (1.778 m)   Wt 182 lb (82.6 kg)   SpO2 98%   BMI 26.11 kg/m   Physical Exam  Constitutional: No distress.  Cardiovascular: Normal rate, regular rhythm and normal heart sounds.  Pulmonary/Chest: Effort normal and breath sounds normal. No  respiratory distress. She has no wheezes.  Abdominal: Soft. She exhibits no distension. There is no tenderness.       Imaging: Ct Chest W Contrast  Result Date: 04/04/2017 CLINICAL DATA:  Right lung cancer. Renal mass. Anemia. Shortness of breath and cough. EXAM: CT CHEST WITH CONTRAST CT ABDOMEN WITH AND WITHOUT CONTRAST TECHNIQUE: Multidetector CT imaging of the chest was performed during intravenous contrast administration. Multidetector CT imaging of the abdomen was performed following the standard protocol before and during bolus administration of intravenous contrast. CONTRAST:  146mL ISOVUE-370 IOPAMIDOL (ISOVUE-370) INJECTION 76% COMPARISON:  Multiple exams, including PET-CT from 08/11/2016; MRI abdomen from 10/20/2016; and CT chest from 11/01/2016 FINDINGS: CT CHEST FINDINGS Cardiovascular: Atherosclerotic calcification of the aortic arch and branch vessels. Mild heterogeneity of pulmonary arterial density noted, likely incidental and less likely to be due to chronic pulmonary embolus. For example, the linear lucency in the right middle lobe pulmonary artery on image 84/2 probably represents streak artifact from the spinal hardware rather than true pulmonary embolus. I do not see any central filling defect within the pulmonary arterial tree to further suggest acute pulmonary embolus. Clearly today's exam was performed as a CT of the chest rather than a CT angiogram of the chest and may have reduced sensitivity and specificity because of this. Mediastinum/Nodes: No pathologic thoracic adenopathy. Esophagus unremarkable. Lungs/Pleura: The dominant spiculated right upper lobe pulmonary nodule measures 1.4 by 1.2 cm on image 77/3, previously 1.5 by 1.1 cm hands essentially stable. Bandlike nodularity in the left upper lobe measures 0.7 by 0.4 cm on image 28/3, stable by my measurements. Stable bilateral ground-glass density nodules are scattered in both lungs. For example an index lesion in the apical  segment right upper lobe measures 2.0 by 1.7 cm (formerly the same). Musculoskeletal: Thoracic posterolateral rod and pedicle screw fixators noted without complicating feature. CT ABDOMEN FINDINGS Hepatobiliary: Diffuse hepatic steatosis. Nodularity of hepatic contour compatible with cirrhosis. Multiple layering gallstones in the gallbladder. No appreciable biliary dilatation. No liver mass identified. Pancreas: Unremarkable Spleen: Unremarkable Adrenals/Urinary Tract: Solid enhancing right kidney upper pole mass measures 2.5 by 2.6 by 2.4 cm (volume = 8.2 cm^3). This previously measured 2.2 by 2.2 by 2.2 cm (volume = 5.6  cm^3) on 10/20/2016. Mild scarring of the right mid kidney. Several additional tiny hypodense renal lesions are technically too small to characterize although statistically likely to be benign. Stomach/Bowel: Unremarkable Vascular/Lymphatic: Aortoiliac atherosclerotic vascular disease. Portacaval node 1.2 cm in short axis on image 41/12, stable. Borderline enlarged right retrocaval node on image 46/12 at 1.0 cm in diameter, previously 1.1 cm on prior PET-CT of 08/11/2016. No tumor thrombus in the right renal vein. Other: Mild stranding along the inferior margins of both paracolic gutters. 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 No specific complicating feature is identified. IMPRESSION: 1. Enlarging solid enhancing mass of the right kidney upper pole, currently 2.6 cm in long axis, favoring renal cell carcinoma. 2. Stable size of the dominant spiculated right upper lobe pulmonary nodule favoring lung cancer. 3. Scattered stable ground-glass densities in the lungs, technically nonspecific, but multifocal low-grade adenocarcinoma is not excluded. 4. Hepatic cirrhosis with diffuse hepatic steatosis and cholelithiasis. 5.  Portacaval lymph node 1.2 cm, likely reactive. A right retrocaval lymph node has slightly reduced in size over the last 7 months and currently is borderline enlarged at 1.0 cm in diameter. 6. Thoracolumbar fusion. 7. Mild heterogeneity of density in the pulmonary arteries with appearance favoring artifact over mild chronic pulmonary embolus. No findings of acute pulmonary embolus. Electronically Signed   By: Van Clines M.D.   On: 04/04/2017 11:57   Ct Abd Wo & W Cm  Result Date: 04/04/2017 CLINICAL DATA:  Right lung cancer. Renal mass. Anemia. Shortness of breath and cough. EXAM: CT CHEST WITH CONTRAST CT ABDOMEN WITH AND WITHOUT CONTRAST TECHNIQUE: Multidetector CT imaging of the chest was performed during intravenous contrast administration. Multidetector CT imaging of the abdomen was performed following the standard protocol before and during bolus administration of intravenous contrast. CONTRAST:  14mL ISOVUE-370 IOPAMIDOL (ISOVUE-370) INJECTION 76% COMPARISON:  Multiple exams, including PET-CT from 08/11/2016; MRI abdomen from 10/20/2016; and CT chest from 11/01/2016 FINDINGS: CT CHEST FINDINGS Cardiovascular: Atherosclerotic calcification of the aortic arch and branch vessels. Mild heterogeneity of pulmonary arterial density noted, likely incidental and less likely to be due to chronic pulmonary embolus. For example, the linear lucency in the right middle lobe pulmonary artery on image 84/2 probably represents streak artifact from the spinal hardware rather than true pulmonary embolus. I do not see any central filling defect within the pulmonary arterial tree to further suggest acute pulmonary embolus. Clearly today's exam was performed as a CT of the chest rather than a CT angiogram of the chest and may have reduced sensitivity and specificity because of this. Mediastinum/Nodes: No pathologic thoracic adenopathy. Esophagus unremarkable. Lungs/Pleura: The dominant spiculated right upper lobe  pulmonary nodule measures 1.4 by 1.2 cm on image 77/3, previously 1.5 by 1.1 cm hands essentially stable. Bandlike nodularity in the left upper lobe measures 0.7 by 0.4 cm on image 28/3, stable by my measurements. Stable bilateral ground-glass density nodules are scattered in both lungs. For example an index lesion in the apical segment right upper lobe measures 2.0 by 1.7 cm (formerly the same). Musculoskeletal: Thoracic posterolateral rod and pedicle screw fixators noted without complicating feature. CT ABDOMEN FINDINGS Hepatobiliary: Diffuse hepatic steatosis. Nodularity of hepatic contour compatible with cirrhosis. Multiple layering gallstones in the gallbladder. No appreciable biliary dilatation. No liver mass identified. Pancreas: Unremarkable Spleen: Unremarkable Adrenals/Urinary Tract: Solid enhancing right kidney upper pole mass measures  2.5 by 2.6 by 2.4 cm (volume = 8.2 cm^3). This previously measured 2.2 by 2.2 by 2.2 cm (volume = 5.6 cm^3) on 10/20/2016. Mild scarring of the right mid kidney. Several additional tiny hypodense renal lesions are technically too small to characterize although statistically likely to be benign. Stomach/Bowel: Unremarkable Vascular/Lymphatic: Aortoiliac atherosclerotic vascular disease. Portacaval node 1.2 cm in short axis on image 41/12, stable. Borderline enlarged right retrocaval node on image 46/12 at 1.0 cm in diameter, previously 1.1 cm on prior PET-CT of 08/11/2016. No tumor thrombus in the right renal vein. Other: Mild stranding along the inferior margins of both paracolic gutters. 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 No specific complicating feature is identified. IMPRESSION: 1. Enlarging solid enhancing mass of the right kidney upper pole, currently 2.6 cm in long axis, favoring renal  cell carcinoma. 2. Stable size of the dominant spiculated right upper lobe pulmonary nodule favoring lung cancer. 3. Scattered stable ground-glass densities in the lungs, technically nonspecific, but multifocal low-grade adenocarcinoma is not excluded. 4. Hepatic cirrhosis with diffuse hepatic steatosis and cholelithiasis. 5. Portacaval lymph node 1.2 cm, likely reactive. A right retrocaval lymph node has slightly reduced in size over the last 7 months and currently is borderline enlarged at 1.0 cm in diameter. 6. Thoracolumbar fusion. 7. Mild heterogeneity of density in the pulmonary arteries with appearance favoring artifact over mild chronic pulmonary embolus. No findings of acute pulmonary embolus. Electronically Signed   By: Van Clines M.D.   On: 04/04/2017 11:57   Ir Radiologist Eval & Mgmt  Result Date: 04/13/2017 Please refer to notes tab for details about interventional procedure. (Op Note)   Labs:  CBC: Recent Labs    10/21/16 1030 11/25/16 1232 01/03/17 1023 04/07/17 1328  WBC 6.3 5.1 6.6 8.1  HGB 11.5* 13.9 14.4 12.9  HCT 33.6* 40.4 42.0 38.1  PLT 147* 108* 140* 135*    COAGS: Recent Labs    11/19/16 1024  INR 1.29    BMP: Recent Labs    11/25/16 1232 04/04/17 1058  NA 139  --   K 3.4*  --   CL 100*  --   CO2 29  --   GLUCOSE 165*  --   BUN <5*  --   CALCIUM 10.0  --   CREATININE 0.82 0.70  GFRNONAA >60  --   GFRAA >60  --     LIVER FUNCTION TESTS: Recent Labs    11/25/16 1232  BILITOT 1.0  AST 78*  ALT 38  ALKPHOS 82  PROT 7.7  ALBUMIN 4.3    TUMOR MARKERS: No results for input(s): AFPTM, CEA, CA199, CHROMGRNA in the last 8760 hours.  Assessment and Plan:  57 year old with a slowly enlarging lesion in the right kidney upper pole. Imaging characteristics are highly suggestive for renal cell carcinoma. I reviewed the patient's imaging and agree that this lesion has been slowly enlarging and maximum size is now roughly 2.6 cm. We  discussed the treatment options including additional surveillance, surgical resection and percutaneous ablation. Patient is not a great candidate for surgical resection based on her comorbidities. Currently, the patient is doing very well from a medical standpoint. Despite her history of lung cancer and cirrhosis, her liver and renal function are good at this time. She has no ascites on her most recent imaging studies. She is a candidate for  image guided cryoablation of this lesion in the right renal upper pole.  The procedure which would require heavy moderate sedation or general anesthesia. Most patients usually require a night in the hospital for observation. We discussed the potential complications which include hemorrhage, infection and renal function loss.   Patient appears to have a very good understanding of the procedure. Patient is trying to weigh the risks and benefits of surveillance versus intervention at this time.  Benefits of treating now would include the size of the lesion and her good medical health at this time. Patient understands that the lesion becomes more difficult to completely treat as it gets larger. After a long discussion, the patient would like to continue surveillance at this time. Plan for six-month follow-up imaging. Will coordinate this follow-up imaging with Dr. Erlene Quan.    Thank you for this interesting consult.  I greatly enjoyed meeting Audrey Garrett and look forward to participating in their care.  A copy of this report was sent to the requesting provider on this date.  Electronically Signed: Burman Riis 04/13/2017, 4:16 PM   I spent a total of  30 Minutes   in face to face in clinical consultation, greater than 50% of which was counseling/coordinating care for a renal lesion.

## 2017-04-15 ENCOUNTER — Ambulatory Visit: Payer: Self-pay | Admitting: Urology

## 2017-04-23 ENCOUNTER — Emergency Department
Admission: EM | Admit: 2017-04-23 | Discharge: 2017-04-23 | Disposition: A | Discharge status: Home / Self Care | Payer: Medicare Other | Attending: Student in an Organized Health Care Education/Training Program | Admitting: Student in an Organized Health Care Education/Training Program

## 2017-04-23 ENCOUNTER — Other Ambulatory Visit: Payer: Self-pay

## 2017-04-23 ENCOUNTER — Encounter: Payer: Self-pay | Admitting: Emergency Medicine

## 2017-04-23 ENCOUNTER — Emergency Department: Payer: Medicare Other

## 2017-04-23 DIAGNOSIS — F17210 Nicotine dependence, cigarettes, uncomplicated: Secondary | ICD-10-CM | POA: Insufficient documentation

## 2017-04-23 DIAGNOSIS — L02416 Cutaneous abscess of left lower limb: Secondary | ICD-10-CM | POA: Insufficient documentation

## 2017-04-23 DIAGNOSIS — Y998 Other external cause status: Secondary | ICD-10-CM | POA: Insufficient documentation

## 2017-04-23 DIAGNOSIS — Y939 Activity, unspecified: Secondary | ICD-10-CM | POA: Diagnosis not present

## 2017-04-23 DIAGNOSIS — I10 Essential (primary) hypertension: Secondary | ICD-10-CM | POA: Insufficient documentation

## 2017-04-23 DIAGNOSIS — L03116 Cellulitis of left lower limb: Secondary | ICD-10-CM | POA: Diagnosis not present

## 2017-04-23 DIAGNOSIS — W01190A Fall on same level from slipping, tripping and stumbling with subsequent striking against furniture, initial encounter: Secondary | ICD-10-CM | POA: Diagnosis not present

## 2017-04-23 DIAGNOSIS — S81812A Laceration without foreign body, left lower leg, initial encounter: Secondary | ICD-10-CM | POA: Diagnosis present

## 2017-04-23 DIAGNOSIS — Z85118 Personal history of other malignant neoplasm of bronchus and lung: Secondary | ICD-10-CM | POA: Insufficient documentation

## 2017-04-23 DIAGNOSIS — E119 Type 2 diabetes mellitus without complications: Secondary | ICD-10-CM | POA: Insufficient documentation

## 2017-04-23 DIAGNOSIS — Y929 Unspecified place or not applicable: Secondary | ICD-10-CM | POA: Insufficient documentation

## 2017-04-23 DIAGNOSIS — Z7984 Long term (current) use of oral hypoglycemic drugs: Secondary | ICD-10-CM | POA: Diagnosis not present

## 2017-04-23 LAB — URINALYSIS, COMPLETE (UACMP) WITH MICROSCOPIC
Bacteria, UA: NONE SEEN
Bilirubin Urine: NEGATIVE
Glucose, UA: NEGATIVE mg/dL
Hgb urine dipstick: NEGATIVE
Ketones, ur: NEGATIVE mg/dL
Leukocytes, UA: NEGATIVE
Nitrite: NEGATIVE
Protein, ur: NEGATIVE mg/dL
Specific Gravity, Urine: 1.006 (ref 1.005–1.030)
pH: 6 (ref 5.0–8.0)

## 2017-04-23 LAB — COMPREHENSIVE METABOLIC PANEL
ALT: 35 U/L (ref 14–54)
AST: 68 U/L — ABNORMAL HIGH (ref 15–41)
Albumin: 4.2 g/dL (ref 3.5–5.0)
Alkaline Phosphatase: 90 U/L (ref 38–126)
Anion gap: 12 (ref 5–15)
BUN: 11 mg/dL (ref 6–20)
CO2: 25 mmol/L (ref 22–32)
Calcium: 8.8 mg/dL — ABNORMAL LOW (ref 8.9–10.3)
Chloride: 93 mmol/L — ABNORMAL LOW (ref 101–111)
Creatinine, Ser: 0.72 mg/dL (ref 0.44–1.00)
GFR calc Af Amer: >60 mL/min (ref >60)
GFR calc non Af Amer: >60 mL/min (ref >60)
Glucose, Bld: 160 mg/dL — ABNORMAL HIGH (ref 65–99)
Potassium: 4 mmol/L (ref 3.5–5.1)
Sodium: 130 mmol/L — ABNORMAL LOW (ref 135–145)
Total Bilirubin: 0.6 mg/dL (ref 0.3–1.2)
Total Protein: 7.5 g/dL (ref 6.5–8.1)

## 2017-04-23 LAB — CBC WITH DIFFERENTIAL/PLATELET
Basophils Absolute: 0 10*3/uL (ref 0–0.1)
Basophils Relative: 1 %
Eosinophils Absolute: 0.1 10*3/uL (ref 0–0.7)
Eosinophils Relative: 1 %
HCT: 35.2 % (ref 35.0–47.0)
Hemoglobin: 12.2 g/dL (ref 12.0–16.0)
Lymphocytes Relative: 16 %
Lymphs Abs: 1.4 10*3/uL (ref 1.0–3.6)
MCH: 37.2 pg — ABNORMAL HIGH (ref 26.0–34.0)
MCHC: 34.6 g/dL (ref 32.0–36.0)
MCV: 107.4 fL — ABNORMAL HIGH (ref 80.0–100.0)
Monocytes Absolute: 0.7 10*3/uL (ref 0.2–0.9)
Monocytes Relative: 9 %
Neutro Abs: 6.1 10*3/uL (ref 1.4–6.5)
Neutrophils Relative %: 73 %
Platelets: 136 10*3/uL — ABNORMAL LOW (ref 150–440)
RBC: 3.28 MIL/uL — ABNORMAL LOW (ref 3.80–5.20)
RDW: 13.8 % (ref 11.5–14.5)
WBC: 8.4 10*3/uL (ref 3.6–11.0)

## 2017-04-23 LAB — LACTIC ACID, PLASMA
Lactic Acid, Venous: 2.2 mmol/L (ref 0.5–1.9)
Lactic Acid, Venous: 2.7 mmol/L (ref 0.5–1.9)

## 2017-04-23 MED ORDER — CLINDAMYCIN HCL 300 MG PO CAPS: 300.0000 mg | ORAL_CAPSULE | Freq: Three times a day (TID) | ORAL | 0 refills | Status: AC

## 2017-04-23 MED ORDER — MUPIROCIN CALCIUM 2 % EX CREA
TOPICAL_CREAM | Freq: Once | CUTANEOUS | Status: AC
  Administered 2017-04-23: 1 via TOPICAL
  Filled 2017-04-23: qty 15

## 2017-04-23 MED ORDER — SODIUM CHLORIDE 0.9 % IV BOLUS (SEPSIS)
1000.0000 mL | Freq: Once | INTRAVENOUS | Status: AC
  Administered 2017-04-23: 1000 mL via INTRAVENOUS

## 2017-04-23 MED ORDER — BACITRACIN ZINC 500 UNIT/GM EX OINT
TOPICAL_OINTMENT | CUTANEOUS | Status: AC
  Administered 2017-04-23: 1
  Filled 2017-04-23: qty 1.8

## 2017-04-23 MED ORDER — CLINDAMYCIN PHOSPHATE 600 MG/50ML IV SOLN
600.0000 mg | Freq: Once | INTRAVENOUS | Status: AC
  Administered 2017-04-23: 600 mg via INTRAVENOUS
  Filled 2017-04-23: qty 50

## 2017-04-23 MED ORDER — MUPIROCIN 2 % EX OINT: TOPICAL_OINTMENT | CUTANEOUS | 0 refills | Status: DC

## 2017-04-23 NOTE — ED Provider Notes (Signed)
[facility] Emergency Department Provider Note    First MD Initiated Contact with Patient 04/23/17 1314     (approximate)  I have reviewed the triage vital signs and the nursing notes.   HISTORY  Chief Complaint Laceration    HPI Audrey Garrett is a 57 y.o. female with a health history of alcoholic cirrhosis who presents for evaluation of redness and pain to the left shin.  So started on the 17th of this month fell and cut her leg on furniture.  States that she has been applying antibiotic ointment and they have used Steri-Strips that were applied since the 18th with no improvement and they are worried about infection.  No fevers.  She did have a home health nurse come see her and was told that it was likely infected due to worsening redness.  She was started on doxycycline 2 days ago with no improvement.  She also states that she has a history of diabetes.  Denies any nausea or vomiting.  Past Medical History:  Diagnosis Date  . Alcohol abuse   . Alcoholic cirrhosis of liver without ascites ([place])   . Anemia   . Anxiety   . Arthropathy   . Back pain   . Bronchitis   . BRONCHITIS, ACUTE 11/01/2009   Qualifier: Diagnosis of  By: Alveta Heimlich MD, Cornelia Copa    . Chronic hepatitis C ([place])   . Chronic LBP 12/25/2014   Overview:  Post extensive surgery   . Cirrhosis ([place])   . DDD (degenerative disc disease), lumbar 01/01/2015  . Depression   . Diabetes mellitus without complication ([place])   . Diverticulosis   . Edema   . Fatigue   . Fibromyalgia   . GERD (gastroesophageal reflux disease)    gastroparesis  . High risk medications (not anticoagulants) long-term use   . Hyperglycemia   . Hypertension   . Kidney mass    11/17 small mass-  . Left leg pain 01/01/2015  . Lumbago   . Lumbar radicular pain 01/01/2015  . Muscle spasm   . Neck pain 01/01/2015  . Polyarthritis   . Reflux   . Restless leg syndrome    01/2016  . RLS (restless legs syndrome)   . Sacroiliac  pain 01/01/2015  . Shoulder pain   . Sinusitis   . SOB (shortness of breath)   . Spine disorder   . Stress headaches   . Upper back pain 01/01/2015  . Urinary frequency   . Vitamin D deficiency disease    Family History  Problem Relation Age of Onset  . Stroke Mother   . Heart disease Mother   . [name] Father   . Breast cancer Sister 78  . Kidney disease Neg Hx   . Bladder Cancer Neg Hx   . Kidney cancer Neg Hx   . Prostate cancer Neg Hx    Past Surgical History:  Procedure Laterality Date  . BACK SURGERY  12/19/2011  . BACK SURGERY    . COLONOSCOPY WITH PROPOFOL N/A 09/25/2015   Procedure: COLONOSCOPY WITH PROPOFOL;  Surgeon: Lollie Sails, MD;  Location: [facility] ENDOSCOPY;  Service: Endoscopy;  Laterality: N/A;  . COLONOSCOPY WITH PROPOFOL N/A 04/01/2016   Procedure: COLONOSCOPY WITH PROPOFOL;  Surgeon: Lollie Sails, MD;  Location: [facility] ENDOSCOPY;  Service: Endoscopy;  Laterality: N/A;  . ELECTROMAGNETIC NAVIGATION BROCHOSCOPY N/A 11/25/2016   Procedure: ELECTROMAGNETIC NAVIGATION BRONCHOSCOPY;  Surgeon: Flora Lipps, MD;  Location: [facility] ORS;  Service: Cardiopulmonary;  Laterality:  N/A;  . ESOPHAGOGASTRODUODENOSCOPY N/A 04/14/2015   Procedure: ESOPHAGOGASTRODUODENOSCOPY (EGD);  Surgeon: Lollie Sails, MD;  Location[facility] ENDOSCOPY;  Service: Endoscopy;  Laterality: N/A;  . ESOPHAGOGASTRODUODENOSCOPY (EGD) WITH PROPOFOL N/A 09/16/2015   Procedure: ESOPHAGOGASTRODUODENOSCOPY (EGD) WITH PROPOFOL;  Surgeon: Lollie Sails, MD;  Location: [facility] ENDOSCOPY;  Service: Endoscopy;  Laterality: N/A;  . ESOPHAGOGASTRODUODENOSCOPY (EGD) WITH PROPOFOL N/A 01/22/2016   Procedure: ESOPHAGOGASTRODUODENOSCOPY (EGD) WITH PROPOFOL;  Surgeon: [name], MD;  Location: [place];  Service: Endoscopy;  Laterality: N/A;  . ESOPHAGOGASTRODUODENOSCOPY (EGD) WITH PROPOFOL N/A 04/01/2016   Procedure: ESOPHAGOGASTRODUODENOSCOPY (EGD) WITH PROPOFOL;  Surgeon: Lollie Sails, MD;  Location:  [facility] ENDOSCOPY;  Service: Endoscopy;  Laterality: N/A;  . HERNIA REPAIR N/A 10/2015   abdominal   . IR RADIOLOGIST EVAL & MGMT  04/13/2017  . TONSILLECTOMY    . TUBAL LIGATION    . UMBILICAL HERNIA REPAIR N/A 11/06/2015   Procedure: HERNIA REPAIR UMBILICAL ADULT;  Surgeon: [name], MD;  Location: [facility] ORS;  Service: General;  Laterality: N/A;   Patient Active Problem List   Diagnosis Date Noted  . Acute postoperative pain 01/04/2017  . Malignant neoplasm of upper lobe of right lung ([place]) 12/02/2016  . Chronic groin pain, left 08/19/2016  . Personal history of tobacco use, presenting hazards to health 08/04/2016  . Osteoarthritis of hip (Bilateral) 07/12/2016  . Chronic hip pain (Bilateral) 07/07/2016  . Bilateral leg edema 04/07/2016  . Opiate use 02/12/2016  . RLS (restless legs syndrome) 02/25/2016  . Iron deficiency anemia due to chronic blood loss 02/11/2016  . Controlled type 2 diabetes mellitus with hyperglycemia ([place]) 01/21/2016  . Acute blood loss anemia 01/21/2016  . Malnutrition of moderate degree 01/21/2016  . Chronic pain syndrome   . Hernia of anterior abdominal wall   . Incarcerated umbilical hernia   . Neurogenic pain 10/01/2015  . S/P thoracolumbar fusion (T5-L5) 10/01/2015  . Grade 1 Retrolisthesis (4 mm) of C5 over C6 and C6 over C7 10/01/2015  . Cervical foraminal stenosis (Left C5-6; Bilateral C6-7) 10/01/2015  . Acute GI bleeding 09/22/2015  . Depression, major, recurrent, moderate ([place]) 05/27/2015  . Abdominal pain, chronic, epigastric 05/27/2015  . Difficulty urinating 05/26/2015  . Nephrolithiasis 05/26/2015  . Alcoholic cirrhosis of liver with ascites ([place]) 05/26/2015  . Hypotension 05/22/2015  . Anemia 05/22/2015  . Thrombocytopenia ([place]) 05/22/2015  . Coagulopathy ([place]) 05/22/2015  . Hyperglycemia 05/22/2015  . Polyneuropathy 05/22/2015  . Urinary retention 05/22/2015  . Hepatic cirrhosis ([place]) 05/19/2015  . Hypomagnesemia (low magnesium  levels) 04/07/2015  . At risk for osteopenia 04/03/2015  . Lumbar spondylosis 04/03/2015  . Chronic lumbar radicular pain (Secondary source of pain) (Left) 04/03/2015  . Chronic neck pain 04/03/2015  . Chronic lower extremity pain (Left) 04/03/2015  . Chronic sacroiliac joint pain (Bilateral) (R>L) 04/03/2015  . Chronic upper back pain 04/03/2015  . Long term current use of opiate analgesic 04/03/2015  . Encounter for chronic pain management 04/03/2015  . Cervical radicular pain 01/01/2015  . Uncomplicated opioid dependence ([place]) 01/01/2015  . Long term prescription opiate use 01/01/2015  . 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 Lumbar facet syndrome (Bilateral) (L>R) 01/01/2015  . Osteoarthritis of spine with radiculopathy, lumbar region 01/01/2015  . Musculoskeletal pain 01/01/2015  . Chronic low back pain (Primary Source of Pain) (Bilateral) (L>R) 01/01/2015  . Insomnia, persistent 12/25/2014  . BP (high blood pressure) 12/25/2014  . HCV (hepatitis C virus) 12/25/2014  . Depression, major,  in remission ([place]) 12/25/2014      Prior to Admission medications   Medication Sig Start Date End Date Taking? Authorizing Provider  furosemide (LASIX) 40 MG tablet Take 40 mg by mouth 2 (two) times daily.  10/13/15   [provider]  lactulose (CHRONULAC) 10 GM/15ML solution Take 10 g by mouth 3 (three) times daily as needed for mild constipation or moderate constipation.     [provider]  magnesium oxide (MAG-OX) 400 MG tablet Take 400 mg by mouth daily.    [provider]  metFORMIN (GLUCOPHAGE) 500 MG tablet Take 500 mg by mouth 3 (three) times daily after meals.    [provider]  Multiple Vitamins-Minerals (CENTRUM SILVER 50+WOMEN PO) Take 1 tablet by mouth daily.    [provider]  Oxycodone HCl 20 MG TABS Take 1 tablet (20 mg total) by mouth every 6 (six) hours. 05/17/17 06/16/17  Vevelyn Francois, NP  pantoprazole (PROTONIX) 40 MG  tablet take once DAILY. 07/29/16   [provider]  PARoxetine (PAXIL) 40 MG tablet Take 40 mg by mouth every morning. 01/20/16   [provider]  pregabalin (LYRICA) 100 MG capsule Take 100 mg by mouth 2 (two) times daily.    [provider]  spironolactone (ALDACTONE) 50 MG tablet TAKE 2 TABLETS BY MOUTH EVERY DAY 09/08/16   [provider]  thiamine (VITAMIN B-1) 100 MG tablet Take 1 tablet (100 mg total) by mouth daily. 02/11/16   Creola Corn, MD    Allergies Patient has no known allergies.    Social History Social History   Tobacco Use  . Smoking status: Current Every Day Smoker    Packs/day: 1.00    Years: 42.00    Pack years: 42.00    Types: Cigarettes  . Smokeless tobacco: Never Used  . Tobacco comment: 2ppd until last 2 years  Substance Use Topics  . Alcohol use: No    Alcohol/week: 0.0 oz    Comment: stopped drinking 05/12/15  . Drug use: No    Review of Systems Patient denies headaches, rhinorrhea, blurry vision, numbness, shortness of breath, chest pain, edema, cough, abdominal pain, nausea, vomiting, diarrhea, dysuria, fevers, rashes or hallucinations unless otherwise stated above in HPI. ____________________________________________   PHYSICAL EXAM:  VITAL SIGNS: Vitals:   04/23/17 1137  BP: 116/66  Pulse: 95  Resp: 16  Temp: 98.8 F (37.1 C)  SpO2: 100%    Constitutional: Alert and oriented. Well appearing and in no acute distress. Eyes: Conjunctivae are normal.  Head: Atraumatic. Nose: No congestion/rhinnorhea. Mouth/Throat: Mucous membranes are moist.   Neck: No stridor. Painless ROM.  Cardiovascular: Normal rate, regular rhythm. Grossly normal heart sounds.  Good peripheral circulation. Respiratory: Normal respiratory effort.  No retractions. Lungs CTAB. Gastrointestinal: Soft and nontender. No distention. No abdominal bruits. No CVA tenderness. Musculoskeletal: 5 cm laceration on the left anterior shin  with surrounding edema but no purulent drainage.  Wound is roughly half centimeter poorly approximated but no evidence of foreign body.  No joint effusions.  DP and PT pulses are palpable and present distally. Neurologic:  Normal speech and language. No gross focal neurologic deficits are appreciated. No facial droop Skin:  Skin is warm, dry and intact. No rash noted. Psychiatric: Mood and affect are normal. Speech and behavior are normal.  ____________________________________________   LABS (all labs ordered are listed, but only abnormal results are displayed)  Results for orders placed or performed during the hospital encounter of 04/23/17 (  from the past 24 hour(s))  Lactic acid, plasma     Status: Abnormal   Collection Time: 04/23/17 11:40 AM  Result Value Ref Range   Lactic Acid, Venous 2.2 (HH) 0.5 - 1.9 mmol/L  Comprehensive metabolic panel     Status: Abnormal   Collection Time: 04/23/17 11:40 AM  Result Value Ref Range   Sodium 130 (L) 135 - 145 mmol/L   Potassium 4.0 3.5 - 5.1 mmol/L   Chloride 93 (L) 101 - 111 mmol/L   CO2 25 22 - 32 mmol/L   Glucose, Bld 160 (H) 65 - 99 mg/dL   BUN 11 6 - 20 mg/dL   Creatinine, Ser 0.72 0.44 - 1.00 mg/dL   Calcium 8.8 (L) 8.9 - 10.3 mg/dL   Total Protein 7.5 6.5 - 8.1 g/dL   Albumin 4.2 3.5 - 5.0 g/dL   AST 68 (H) 15 - 41 U/L   ALT 35 14 - 54 U/L   Alkaline Phosphatase 90 38 - 126 U/L   Total Bilirubin 0.6 0.3 - 1.2 mg/dL   GFR calc non Af Amer >60 >60 mL/min   GFR calc Af Amer >60 >60 mL/min   Anion gap 12 5 - 15  CBC with Differential     Status: Abnormal   Collection Time: 04/23/17 11:40 AM  Result Value Ref Range   WBC 8.4 3.6 - 11.0 K/uL   RBC 3.28 (L) 3.80 - 5.20 MIL/uL   Hemoglobin 12.2 12.0 - 16.0 g/dL   HCT 35.2 35.0 - 47.0 %   MCV 107.4 (H) 80.0 - 100.0 fL   MCH 37.2 (H) 26.0 - 34.0 pg   MCHC 34.6 32.0 - 36.0 g/dL   RDW 13.8 11.5 - 14.5 %   Platelets 136 (L) 150 - 440 K/uL   Neutrophils Relative % 73 %   Neutro Abs  6.1 1.4 - 6.5 K/uL   Lymphocytes Relative 16 %   Lymphs Abs 1.4 1.0 - 3.6 K/uL   Monocytes Relative 9 %   Monocytes Absolute 0.7 0.2 - 0.9 K/uL   Eosinophils Relative 1 %   Eosinophils Absolute 0.1 0 - 0.7 K/uL   Basophils Relative 1 %   Basophils Absolute 0.0 0 - 0.1 K/uL   ____________________________________________ ____________________________________________  RADIOLOGY  I personally reviewed all radiographic images ordered to evaluate for the above acute complaints and reviewed radiology reports and findings.  These findings were personally discussed with the patient.  Please see medical record for radiology report.  ____________________________________________   PROCEDURES  Procedure(s) performed:  Procedures    Critical Care performed: no ____________________________________________   INITIAL IMPRESSION / ASSESSMENT AND PLAN / ED COURSE  Pertinent labs & imaging results that were available during my care of the patient were reviewed by me and considered in my medical decision making (see chart for details).  DDX: Cellulitis, necrotizing soft tissue infection, edema, vascular injury.  Audrey Garrett is a 57 y.o. who presents to the ED with symptoms as described above.  Patient afebrile Heema dynamically stable.  X-ray shows no evidence of osteo-foreign body or fracture.  Presentation most clinically consistent with cellulitis related to open wound with poor healing.  Patient will be given topical antibiotics as well as given a dose of IV clindamycin.  Patient had a lactate that was checked in triage which is borderline elevated.  She was given IV fluid for this.  Repeat lactate is roughly the same at 2.7.  This is not clinically consistent with sepsis.  Lactate likely chronically elevated due to her underlying liver disease and patient does not have any signs of sepsis at this time.  Patient is requesting discharge home.  Based on her elevated lactate and history of  cirrhosis with evidence of cellulitis I did recommend admission to the hospital for IV antibiotics and additional IV fluids.  Patient states that she does not want this and wants to be discharged home with trial of oral antibiotics.  She demonstrates understanding that going home on oral antibiotics could lead to delayed healing and worsening of her condition and even death in rare circumstances and agrees to return to the ER should her symptoms worsen.      ____________________________________________   FINAL CLINICAL IMPRESSION(S) / ED DIAGNOSES  Final diagnoses:  Cellulitis and abscess of left leg      NEW MEDICATIONS STARTED DURING THIS VISIT:  New Prescriptions   No medications on file     Note:  This document was prepared using Dragon voice recognition software and may include unintentional dictation errors.    Merlyn Lot, MD 04/23/17 [phone]

## 2017-04-23 NOTE — Discharge Instructions (Signed)
You have evidence of cellulitis, a skin infection, of the left leg.  I have recommended to stay in the hospital for IV antibiotics but you stated that you would prefer to go home and continue antibiotics at home which is reasonable.  Please follow up with physician, wound care clinic or in the ER within 24-48 hours for repeat wound evaluation.  Be sure to take antibiotics as directed.  Be sure to change dressing and apply antibiotic ointment 3 times daily.  Return for any fevers worsening pain worsening cellulitis including redness or drainage from the wound or for any additional questions or concerns.

## 2017-04-23 NOTE — ED Triage Notes (Signed)
Pt states fell and got a gash to her L shin on 1/17. Pt states that her husband applied steri strips to laceration on 1/18. Pt states saw her PCP on 1/22 who told her that her wound showed no signs of infection and had her follow up with home health. Pt states home health nurse told her that her wound was infected, called her PCP back was placed on Doxycycline for the infection. Pt with noted approx 3.5in laceration to anterior shin. Wound is noted to have black wound bed with surrounding redness with serosanguinous drainage to bandage. Pt states hx of diabetes at this time.

## 2017-04-28 ENCOUNTER — Telehealth: Payer: Self-pay | Admitting: *Deleted

## 2017-04-28 NOTE — Telephone Encounter (Signed)
Patient states she would like to get xrays done. States she is coming in next week and will discuss with him then.

## 2017-05-04 ENCOUNTER — Ambulatory Visit: Payer: Medicare Other | Attending: Nurse Practitioner | Admitting: Nurse Practitioner

## 2017-05-04 ENCOUNTER — Encounter: Payer: Self-pay | Admitting: Nurse Practitioner

## 2017-05-04 VITALS — BP 113/70 | HR 98 | Temp 98.2°F | Resp 16 | Ht 70.0 in | Wt 182.0 lb

## 2017-05-04 DIAGNOSIS — G8929 Other chronic pain: Secondary | ICD-10-CM

## 2017-05-04 DIAGNOSIS — G629 Polyneuropathy, unspecified: Secondary | ICD-10-CM

## 2017-05-04 DIAGNOSIS — D696 Thrombocytopenia, unspecified: Secondary | ICD-10-CM | POA: Insufficient documentation

## 2017-05-04 DIAGNOSIS — M79601 Pain in right arm: Secondary | ICD-10-CM | POA: Insufficient documentation

## 2017-05-04 DIAGNOSIS — Z5181 Encounter for therapeutic drug level monitoring: Secondary | ICD-10-CM | POA: Diagnosis not present

## 2017-05-04 DIAGNOSIS — M7918 Myalgia, other site: Secondary | ICD-10-CM | POA: Diagnosis not present

## 2017-05-04 DIAGNOSIS — M47896 Other spondylosis, lumbar region: Secondary | ICD-10-CM | POA: Diagnosis not present

## 2017-05-04 DIAGNOSIS — M542 Cervicalgia: Secondary | ICD-10-CM

## 2017-05-04 DIAGNOSIS — D509 Iron deficiency anemia, unspecified: Secondary | ICD-10-CM | POA: Insufficient documentation

## 2017-05-04 DIAGNOSIS — M792 Neuralgia and neuritis, unspecified: Secondary | ICD-10-CM | POA: Diagnosis not present

## 2017-05-04 DIAGNOSIS — E440 Moderate protein-calorie malnutrition: Secondary | ICD-10-CM | POA: Insufficient documentation

## 2017-05-04 DIAGNOSIS — F17210 Nicotine dependence, cigarettes, uncomplicated: Secondary | ICD-10-CM | POA: Insufficient documentation

## 2017-05-04 DIAGNOSIS — M5412 Radiculopathy, cervical region: Secondary | ICD-10-CM | POA: Diagnosis not present

## 2017-05-04 DIAGNOSIS — M160 Bilateral primary osteoarthritis of hip: Secondary | ICD-10-CM | POA: Diagnosis not present

## 2017-05-04 DIAGNOSIS — M5416 Radiculopathy, lumbar region: Secondary | ICD-10-CM

## 2017-05-04 DIAGNOSIS — M79672 Pain in left foot: Secondary | ICD-10-CM | POA: Insufficient documentation

## 2017-05-04 DIAGNOSIS — I10 Essential (primary) hypertension: Secondary | ICD-10-CM | POA: Diagnosis not present

## 2017-05-04 DIAGNOSIS — G894 Chronic pain syndrome: Secondary | ICD-10-CM

## 2017-05-04 DIAGNOSIS — E1165 Type 2 diabetes mellitus with hyperglycemia: Secondary | ICD-10-CM | POA: Diagnosis not present

## 2017-05-04 DIAGNOSIS — Z7984 Long term (current) use of oral hypoglycemic drugs: Secondary | ICD-10-CM | POA: Insufficient documentation

## 2017-05-04 DIAGNOSIS — F331 Major depressive disorder, recurrent, moderate: Secondary | ICD-10-CM | POA: Diagnosis not present

## 2017-05-04 DIAGNOSIS — Z79891 Long term (current) use of opiate analgesic: Secondary | ICD-10-CM

## 2017-05-04 DIAGNOSIS — M5442 Lumbago with sciatica, left side: Secondary | ICD-10-CM

## 2017-05-04 DIAGNOSIS — G2581 Restless legs syndrome: Secondary | ICD-10-CM | POA: Insufficient documentation

## 2017-05-04 DIAGNOSIS — B1920 Unspecified viral hepatitis C without hepatic coma: Secondary | ICD-10-CM | POA: Insufficient documentation

## 2017-05-04 DIAGNOSIS — K7030 Alcoholic cirrhosis of liver without ascites: Secondary | ICD-10-CM | POA: Insufficient documentation

## 2017-05-04 DIAGNOSIS — M549 Dorsalgia, unspecified: Secondary | ICD-10-CM | POA: Diagnosis not present

## 2017-05-04 DIAGNOSIS — M533 Sacrococcygeal disorders, not elsewhere classified: Secondary | ICD-10-CM | POA: Diagnosis not present

## 2017-05-04 DIAGNOSIS — F419 Anxiety disorder, unspecified: Secondary | ICD-10-CM | POA: Insufficient documentation

## 2017-05-04 DIAGNOSIS — M79671 Pain in right foot: Secondary | ICD-10-CM | POA: Insufficient documentation

## 2017-05-04 DIAGNOSIS — M47816 Spondylosis without myelopathy or radiculopathy, lumbar region: Secondary | ICD-10-CM | POA: Diagnosis not present

## 2017-05-04 DIAGNOSIS — G4700 Insomnia, unspecified: Secondary | ICD-10-CM | POA: Insufficient documentation

## 2017-05-04 DIAGNOSIS — M545 Low back pain: Secondary | ICD-10-CM | POA: Insufficient documentation

## 2017-05-04 DIAGNOSIS — Z79899 Other long term (current) drug therapy: Secondary | ICD-10-CM | POA: Insufficient documentation

## 2017-05-04 DIAGNOSIS — E1142 Type 2 diabetes mellitus with diabetic polyneuropathy: Secondary | ICD-10-CM | POA: Insufficient documentation

## 2017-05-04 MED ORDER — OXYCODONE HCL 20 MG PO TABS: 1.0000 | ORAL_TABLET | Freq: Four times a day (QID) | ORAL | 0 refills | Status: DC

## 2017-05-04 MED ORDER — OXYCODONE HCL 20 MG PO TABS: 1.0000 | ORAL_TABLET | Freq: Four times a day (QID) | ORAL | 0 refills | Status: DC | PRN

## 2017-05-04 MED ORDER — ORPHENADRINE CITRATE 30 MG/ML IJ SOLN
60.0000 mg | Freq: Once | INTRAMUSCULAR | Status: AC
  Administered 2017-05-04: 60 mg via INTRAMUSCULAR
  Filled 2017-05-04: qty 2

## 2017-05-04 MED ORDER — KETOROLAC TROMETHAMINE 60 MG/2ML IM SOLN
60.0000 mg | Freq: Once | INTRAMUSCULAR | Status: AC
  Administered 2017-05-04: 60 mg via INTRAMUSCULAR
  Filled 2017-05-04: qty 2

## 2017-05-04 NOTE — Progress Notes (Signed)
[name] Name: Audrey Garrett  MRN: [id number]  Referring Provider: Jodi Marble, MD  DOB: [birth year]  PCP: Jodi Marble, MD  DOS: 05/04/2017  Note by: Vevelyn Francois NP  Service setting: Ambulatory outpatient  Specialty: Interventional Pain Management  Location: [facility] (AMB) Pain Management Facility    Patient type: Established    Primary Reason(s) for Visit: Encounter for prescription drug management. (Level of risk: moderate)  CC: Back Pain (lower bilateral); Arm Pain (right); Neck Pain (hurting since fall, 3 weeks ago); and Foot Pain (bilateral)  HPI  Audrey Garrett is a 57 y.o. year old, female patient, who comes today for a medication management evaluation. She has Insomnia, persistent; BP (high blood pressure); Cervical radicular pain; Uncomplicated opioid dependence ([place]); Long term prescription opiate use; Failed back surgical syndrome; Lumbar facet syndrome (Bilateral) (L>R); Osteoarthritis of spine with radiculopathy, lumbar region; Musculoskeletal pain; Chronic low back pain (Primary Source of Pain) (Bilateral) (L>R); HCV (hepatitis C virus); At risk for osteopenia; Lumbar spondylosis; Chronic lumbar radicular pain (Secondary source of pain) (Left); Chronic neck pain; Chronic lower extremity pain (Left); Chronic sacroiliac joint pain (Bilateral) (R>L); Chronic upper back pain; Long term current use of opiate analgesic; Encounter for chronic pain management; Hypomagnesemia (low magnesium levels); Hypotension; Anemia; Thrombocytopenia ([place]); Coagulopathy ([place]); Hyperglycemia; Polyneuropathy; Urinary retention; Difficulty urinating; Nephrolithiasis; Alcoholic cirrhosis of liver with ascites ([place]); Depression, major, recurrent, moderate ([place]); Abdominal pain, chronic, epigastric; Hepatic cirrhosis ([place]); Acute GI bleeding; Neurogenic pain; S/P thoracolumbar fusion (T5-L5); Grade 1 Retrolisthesis (4 mm) of C5 over C6 and C6 over C7; Cervical foraminal stenosis (Left C5-6; Bilateral C6-7); Hernia  of anterior abdominal wall; Incarcerated umbilical hernia; Controlled type 2 diabetes mellitus with hyperglycemia ([place]); Acute blood loss anemia; Chronic pain syndrome; Malnutrition of moderate degree; Iron deficiency anemia due to chronic blood loss; Opiate use; RLS (restless legs syndrome); Bilateral leg edema; Depression, major, in remission ([place]); Chronic hip pain (Bilateral); Osteoarthritis of hip (Bilateral); Personal history of tobacco use, presenting hazards to health; Chronic groin pain, left; Malignant neoplasm of upper lobe of right lung ([place]); and Acute postoperative pain on their problem list. Her primarily concern today is the Back Pain (lower bilateral); Arm Pain (right); Neck Pain (hurting since fall, 3 weeks ago); and Foot Pain (bilateral)  Pain Assessment: Location: Lower, Left, Right Back Radiating: into buttocks and down both legs  Onset: More than a month ago Duration: Chronic pain Quality: Aching, Discomfort, Constant Severity: 4 /10 (self-reported pain score)  Note: Reported level is compatible with observation.                          Effect on ADL: unstable.   Timing: Constant Modifying factors: heating pad, positions changes  Audrey Garrett was last scheduled for an appointment on 02/15/2017 for medication management. During today's appointment we reviewed Audrey Garrett's chronic pain status, as well as her outpatient medication regimen. She admits that she is concern about the numbness in her feet. She states that her walking has changed along with her posture.  She feels like she is having a loss of her posture. She states that she is not sure why this changed. She did fall and suffered a gashed her left leg. She is having increased arm pain. She feels embrassed about her walking. She was referred to PT. This did not go well because she was told that no exercise will help her it will only cause more pain. She is concern that  she may loose her ablity to walk. She feels like her  leg. She admits that her DM is not controlled.  She admits that she has been diagnosed with renal cell carcinoma . She admits that she has had this for several years. She denies that it is metestatis from her lung cancer.    The patient  reports that she does not use drugs. Her body mass index is 26.11 kg/m.  Further details on both, my assessment(s), as well as the proposed treatment plan, please see below.  Controlled Substance Pharmacotherapy Assessment REMS (Risk Evaluation and Mitigation Strategy)  Analgesic:Oxycodone IR 20 mg every 6 (80 mg/day) MME/day:120 mg/day.   Janett Billow, RN  05/04/2017 11:11 AM  Sign at close encounter Nursing Pain Medication Assessment:  Safety precautions to be maintained throughout the outpatient stay will include: orient to surroundings, keep bed in low position, maintain call bell within reach at all times, provide assistance with transfer out of bed and ambulation.  Medication Inspection Compliance: Pill count conducted under aseptic conditions, in front of the patient. Neither the pills nor the bottle was removed from the patient's sight at any time. Once count was completed pills were immediately returned to the patient in their original bottle.  Medication: Oxycodone IR Pill/Patch Count: 52 of 120 pills remain Pill/Patch Appearance: Markings consistent with prescribed medication Bottle Appearance: Standard pharmacy container. Clearly labeled. Filled Date: 21 / 21 / 2018 Last Medication intake:  Today   Pharmacokinetics: Liberation and absorption (onset of action): WNL Distribution (time to peak effect): WNL Metabolism and excretion (duration of action): WNL         Pharmacodynamics: Desired effects: Analgesia: Audrey Garrett reports >50% benefit. Functional ability: Patient reports that medication allows her to accomplish basic ADLs Clinically meaningful improvement in function (CMIF): Sustained CMIF goals met Perceived effectiveness:  Described as relatively effective, allowing for increase in activities of daily living (ADL) Undesirable effects: Side-effects or Adverse reactions: None reported Monitoring: [place] PMP: Online review of the past 47-monthperiod conducted. Compliant with practice rules and regulations Last UDS on record: Summary  Date Value Ref Range Status  11/18/2016 FINAL  Final    Comment:    ==================================================================== TOXASSURE SELECT 13 (MW) ==================================================================== Test                             Result       Flag       Units Drug Present and Declared for Prescription Verification   Oxycodone                      3221         EXPECTED   ng/mg creat   Oxymorphone                    370          EXPECTED   ng/mg creat   Noroxycodone                   >6536        EXPECTED   ng/mg creat   Noroxymorphone                 112          EXPECTED   ng/mg creat    Sources of oxycodone are scheduled prescription medications.    Oxymorphone, noroxycodone, and noroxymorphone are expected    metabolites  of oxycodone. Oxymorphone is also available as a    scheduled prescription medication. ==================================================================== Test                      Result    Flag   Units      Ref Range   Creatinine              153              mg/dL      >=20 ==================================================================== Declared Medications:  The flagging and interpretation on this report are based on the  following declared medications.  Unexpected results may arise from  inaccuracies in the declared medications.  **Note: The testing scope of this panel includes these medications:  Oxycodone  **Note: The testing scope of this panel does not include following  reported medications:  Buspirone  Furosemide  Lactulose  Magnesium Oxide  Multivitamin  Pantoprazole (Protonix)  Paroxetine (Paxil)   Pregabalin (Lyrica)  Spironolactone (Aldactone)  Vitamin B1 ==================================================================== For clinical consultation, please call [phone]. ====================================================================    UDS interpretation: Compliant          Medication Assessment Form: Reviewed. Patient indicates being compliant with therapy Treatment compliance: Compliant Risk Assessment Profile: Aberrant behavior: See prior evaluations. None observed or detected today Comorbid factors increasing risk of overdose: See prior notes. No additional risks detected today Risk of substance use disorder (SUD): Low Opioid Risk Tool - 05/04/17 1107      Family History of Substance Abuse   Alcohol  Negative    Illegal Drugs  Negative    Rx Drugs  Negative      Personal History of Substance Abuse   Alcohol  Negative    Illegal Drugs  Negative    Rx Drugs  Negative      Psychological Disease   Psychological Disease  Positive    ADD  Negative    OCD  Negative    Bipolar  Negative    Schizophrenia  Negative    Depression  Positive currently taking paxil and states it is workking for depression   currently taking paxil and states it is workking for depression     Total Score   Opioid Risk Tool Scoring  3    Opioid Risk Interpretation  Low Risk      ORT Scoring interpretation table:  Score <3 = Low Risk for SUD  Score between 4-7 = Moderate Risk for SUD  Score >8 = High Risk for Opioid Abuse   Risk Mitigation Strategies:  Patient Counseling: Covered Patient-Prescriber Agreement (PPA): Present and active  Notification to other healthcare providers: Done  Pharmacologic Plan: No change in therapy, at this time.             Laboratory Chemistry  Inflammation Markers (CRP: Acute Phase) (ESR: Chronic Phase) Lab Results  Component Value Date   CRP <0.5 04/03/2015   ESRSEDRATE 27 04/03/2015   LATICACIDVEN 2.7 (HH) 04/23/2017                  Rheumatology Markers No results found for: RF, ANA, Therisa Doyne, [facility]              Renal Function Markers Lab Results  Component Value Date   BUN 11 04/23/2017   CREATININE 0.72 04/23/2017   GFRAA >60 04/23/2017   GFRNONAA >60 04/23/2017                 Hepatic Function Markers  Lab Results  Component Value Date   AST 68 (H) 04/23/2017   ALT 35 04/23/2017   ALBUMIN 4.2 04/23/2017   ALKPHOS 90 04/23/2017   LIPASE 32 11/06/2015   AMMONIA 12 09/14/2016                 Electrolytes Lab Results  Component Value Date   NA 130 (L) 04/23/2017   K 4.0 04/23/2017   CL 93 (L) 04/23/2017   CALCIUM 8.8 (L) 04/23/2017   MG 1.9 01/22/2016   PHOS 3.6 11/23/2013                 Neuropathy Markers Lab Results  Component Value Date   VITAMINB12 557 01/03/2017   FOLATE 13.5 01/03/2017   HGBA1C 5.3 05/22/2015                 Bone Pathology Markers No results found for: [place], VD125OH2TOT, VF6433IR5, [id number], 25OHVITD1, 25OHVITD2, 25OHVITD3, TESTOFREE, TESTOSTERONE               Coagulation Parameters Lab Results  Component Value Date   INR 1.29 11/19/2016   LABPROT 16.2 (H) 11/19/2016   APTT 34 01/20/2016   PLT 136 (L) 04/23/2017                 Cardiovascular Markers Lab Results  Component Value Date   BNP 929 (H) 09/23/2013   CKTOTAL 1,597 (H) 12/17/2012   TROPONINI <0.03 05/07/2015   HGB 12.2 04/23/2017   HCT 35.2 04/23/2017                 CA Markers No results found for: CEA, CA125, LABCA2               Note: Lab results reviewed.  Recent Diagnostic Imaging Results  DG Tibia/Fibula Left CLINICAL DATA:  57 year old female with a history fall  EXAM: LEFT TIBIA AND FIBULA - 2 VIEW  COMPARISON:  None.  FINDINGS: There is no evidence of fracture or other focal bone lesions. Soft tissues are unremarkable.  IMPRESSION: Negative for acute fracture  Electronically Signed   By: Corrie Mckusick D.O.   On: 04/23/2017  14:28  Complexity Note: Imaging results reviewed. Results shared with Audrey Garrett, using [employer].                         Meds   Current Outpatient Medications:  .  furosemide (LASIX) 40 MG tablet, Take 40 mg by mouth 2 (two) times daily. , Disp: , Rfl:  .  lactulose (CHRONULAC) 10 GM/15ML solution, Take 10 g by mouth 3 (three) times daily as needed for mild constipation or moderate constipation. , Disp: , Rfl:  .  magnesium oxide (MAG-OX) 400 MG tablet, Take 400 mg by mouth daily., Disp: , Rfl:  .  metFORMIN (GLUCOPHAGE) 500 MG tablet, Take 500 mg by mouth 3 (three) times daily after meals., Disp: , Rfl:  .  Multiple Vitamins-Minerals (CENTRUM SILVER 50+WOMEN PO), Take 1 tablet by mouth daily., Disp: , Rfl:  .  mupirocin ointment (BACTROBAN) 2 %, Apply to affected area 3 times daily, Disp: 22 g, Rfl: 0 .  [START ON 08/15/2017] Oxycodone HCl 20 MG TABS, Take 1 tablet (20 mg total) by mouth every 6 (six) hours., Disp: 120 tablet, Rfl: 0 .  pantoprazole (PROTONIX) 40 MG tablet, Take 40 mg by mouth twice daily., Disp: , Rfl: 11 .  PARoxetine (PAXIL) 40 MG tablet, Take 40 mg by  mouth every morning., Disp: , Rfl: 2 .  pregabalin (LYRICA) 100 MG capsule, Take 100 mg by mouth 2 (two) times daily., Disp: , Rfl:  .  spironolactone (ALDACTONE) 50 MG tablet, TAKE 2 TABLETS BY MOUTH EVERY DAY, Disp: , Rfl:  .  thiamine (VITAMIN B-1) 100 MG tablet, Take 1 tablet (100 mg total) by mouth daily., Disp: 90 tablet, Rfl: 6 .  LYRICA 150 MG capsule, Take 150 mg by mouth 2 (two) times daily., Disp: , Rfl: 2 .  [START ON 07/16/2017] Oxycodone HCl 20 MG TABS, Take 1 tablet (20 mg total) by mouth every 6 (six) hours., Disp: 120 tablet, Rfl: 0 .  [START ON 06/16/2017] Oxycodone HCl 20 MG TABS, Take 1 tablet (20 mg total) by mouth every 6 (six) hours as needed., Disp: 120 tablet, Rfl: 0 .  TRADJENTA 5 MG TABS tablet, Take 5 mg by mouth daily., Disp: , Rfl: 2  ROS  Constitutional: Denies any fever or  chills Gastrointestinal: No reported hemesis, hematochezia, vomiting, or acute GI distress Musculoskeletal: Denies any acute onset joint swelling, redness, loss of ROM, or weakness Neurological: No reported episodes of acute onset apraxia, aphasia, dysarthria, agnosia, amnesia, paralysis, loss of coordination, or loss of consciousness  Allergies  Audrey Garrett has No Known Allergies.  PFSH  Drug: Audrey Garrett  reports that she does not use drugs. Alcohol:  reports that she does not drink alcohol. Tobacco:  reports that she has been smoking cigarettes.  She has a 42.00 pack-year smoking history. she has never used smokeless tobacco. Medical:  has a past medical history of Alcohol abuse, Alcoholic cirrhosis of liver without ascites ([place]), Anemia, Anxiety, Arthropathy, Back pain, Bronchitis, BRONCHITIS, ACUTE (11/01/2009), Chronic hepatitis C ([place]), Chronic LBP (12/25/2014), Cirrhosis ([place]), DDD (degenerative disc disease), lumbar (01/01/2015), Depression, Diabetes mellitus without complication ([place]), Diverticulosis, Edema, Fatigue, Fibromyalgia, GERD (gastroesophageal reflux disease), High risk medications (not anticoagulants) long-term use, Hyperglycemia, Hypertension, Kidney mass, Left leg pain (01/01/2015), Lumbago, Lumbar radicular pain (01/01/2015), Muscle spasm, Neck pain (01/01/2015), Polyarthritis, Reflux, Restless leg syndrome, RLS (restless legs syndrome), Sacroiliac pain (01/01/2015), Shoulder pain, Sinusitis, SOB (shortness of breath), Spine disorder, Stress headaches, Upper back pain (01/01/2015), Urinary frequency, and Vitamin D deficiency disease. Surgical: Audrey Garrett  has a past surgical history that includes Tubal ligation; Tonsillectomy; Back surgery (12/19/2011); Esophagogastroduodenoscopy (N/A, 04/14/2015); Esophagogastroduodenoscopy (egd) with propofol (N/A, 09/16/2015); Colonoscopy with propofol (N/A, 09/25/2015); Umbilical hernia repair (N/A, 11/06/2015); Hernia repair (N/A, 10/2015);  Esophagogastroduodenoscopy (egd) with propofol (N/A, 01/22/2016); Back surgery; Colonoscopy with propofol (N/A, 04/01/2016); Esophagogastroduodenoscopy (egd) with propofol (N/A, 04/01/2016); Electormagnetic navigation bronchoscopy (N/A, 11/25/2016); and IR Radiologist Eval & Mgmt (04/13/2017). Family: family history includes [name] in her father; Breast cancer (age of onset: 70) in her sister; Heart disease in her mother; Stroke in her mother.  Constitutional Exam  General appearance: Well nourished, well developed, and well hydrated. In no apparent acute distress Vitals:   05/04/17 1058  BP: 113/70  Pulse: 98  Resp: 16  Temp: 98.2 F (36.8 C)  TempSrc: Oral  SpO2: 99%  Weight: 182 lb (82.6 kg)  Height: '5\' 10"'$  (1.778 m)  Psych/Mental status: Alert, oriented x 3 (person, place, & time)       Eyes: PERLA Respiratory: No evidence of acute respiratory distress  Cervical Spine Area Exam  Skin & Axial Inspection: No masses, redness, edema, swelling, or associated skin lesions Alignment: Symmetrical Functional ROM: Unrestricted ROM      Stability: No instability detected Muscle Tone/Strength: Functionally intact. No  obvious neuro-muscular anomalies detected. Sensory (Neurological): Unimpaired Palpation: No palpable anomalies              Upper Extremity (UE) Exam    Side: Right upper extremity  Side: Left upper extremity  Skin & Extremity Inspection: Skin color, temperature, and hair growth are WNL. No peripheral edema or cyanosis. No masses, redness, swelling, asymmetry, or associated skin lesions. No contractures.  Skin & Extremity Inspection: Skin color, temperature, and hair growth are WNL. No peripheral edema or cyanosis. No masses, redness, swelling, asymmetry, or associated skin lesions. No contractures.  Functional ROM: Unrestricted ROM          Functional ROM: Unrestricted ROM          Muscle Tone/Strength: Functionally intact. No obvious neuro-muscular anomalies detected.  Muscle  Tone/Strength: Functionally intact. No obvious neuro-muscular anomalies detected.  Sensory (Neurological): Unimpaired          Sensory (Neurological): Unimpaired          Palpation: No palpable anomalies              Palpation: No palpable anomalies              Specialized Test(s): Deferred         Specialized Test(s): Deferred          Thoracic Spine Area Exam  Skin & Axial Inspection: No masses, redness, or swelling Alignment: Symmetrical Functional ROM: Unrestricted ROM Stability: No instability detected Muscle Tone/Strength: Functionally intact. No obvious neuro-muscular anomalies detected. Sensory (Neurological): Unimpaired Muscle strength & Tone: No palpable anomalies  Lumbar Spine Area Exam  Skin & Axial Inspection: No masses, redness, or swelling Alignment: Symmetrical Functional ROM: Unrestricted ROM      Stability: No instability detected Muscle Tone/Strength: Functionally intact. No obvious neuro-muscular anomalies detected. Sensory (Neurological): Unimpaired Palpation: No palpable anomalies       Provocative Tests: Lumbar Hyperextension and rotation test: evaluation deferred today       Lumbar Lateral bending test: evaluation deferred today       Patrick's Maneuver: evaluation deferred today                    Gait & Posture Assessment  Ambulation: Unassisted Gait: Relatively normal for age and body habitus Posture: WNL   Lower Extremity Exam    Side: Right lower extremity  Side: Left lower extremity  Skin & Extremity Inspection: Skin color, temperature, and hair growth are WNL. No peripheral edema or cyanosis. No masses, redness, swelling, asymmetry, or associated skin lesions. No contractures.  Skin & Extremity Inspection: Skin color, temperature, and hair growth are WNL. No peripheral edema or cyanosis. No masses, redness, swelling, asymmetry, or associated skin lesions. No contractures.  Functional ROM: Unrestricted ROM          Functional ROM: Unrestricted ROM           Muscle Tone/Strength: Functionally intact. No obvious neuro-muscular anomalies detected.  Muscle Tone/Strength: Functionally intact. No obvious neuro-muscular anomalies detected.  Sensory (Neurological): Unimpaired  Sensory (Neurological): Unimpaired  Palpation: No palpable anomalies  Palpation: No palpable anomalies   Assessment  Primary Diagnosis & Pertinent Problem List: The primary encounter diagnosis was Chronic lumbar radicular pain (Secondary source of pain) (Left). Diagnoses of Chronic low back pain (Primary Source of Pain) (Bilateral) (L>R), Lumbar spondylosis, Chronic sacroiliac joint pain (Bilateral) (R>L), Chronic neck pain, Chronic upper back pain, Chronic pain syndrome, Long term current use of opiate analgesic, Neurogenic pain, and Polyneuropathy were also pertinent  to this visit.  Status Diagnosis  Controlled Controlled Controlled 1. Chronic lumbar radicular pain (Secondary source of pain) (Left)   2. Chronic low back pain (Primary Source of Pain) (Bilateral) (L>R)   3. Lumbar spondylosis   4. Chronic sacroiliac joint pain (Bilateral) (R>L)   5. Chronic neck pain   6. Chronic upper back pain   7. Chronic pain syndrome   8. Long term current use of opiate analgesic   9. Neurogenic pain   10. Polyneuropathy     Problems updated and reviewed during this visit: No problems updated. Plan of Care  Pharmacotherapy (Medications Ordered): Meds ordered this encounter  Medications  . Oxycodone HCl 20 MG TABS    Sig: Take 1 tablet (20 mg total) by mouth every 6 (six) hours.    Dispense:  120 tablet    Refill:  0    Fill one day early if pharmacy is closed on scheduled refill date. Do not fill until:08/15/2017 To last until: 09/14/2017    Order Specific Question:   Supervising Provider    Answer:   [name] [phone]  . Oxycodone HCl 20 MG TABS    Sig: Take 1 tablet (20 mg total) by mouth every 6 (six) hours.    Dispense:  120 tablet    Refill:  0    Do not add  this medication to the electronic "Automatic Refill" notification system. Patient may have prescription filled one day early if pharmacy is closed on scheduled refill date. Do not fill until: 07/16/2017 To last until: 08/15/2017    Order Specific Question:   Supervising Provider    Answer:   [name] [phone]  . Oxycodone HCl 20 MG TABS    Sig: Take 1 tablet (20 mg total) by mouth every 6 (six) hours as needed.    Dispense:  120 tablet    Refill:  0    Patient may have prescription filled one day early if pharmacy is closed on scheduled refill date. Do not fill until: 06/16/2017 To last until: 07/16/2017    Order Specific Question:   Supervising Provider    Answer:   [name] [phone]  . ketorolac (TORADOL) injection 60 mg  . orphenadrine (NORFLEX) injection 60 mg   New Prescriptions   No medications on file   Medications administered today: We administered ketorolac and orphenadrine. Lab-work, procedure(s), and/or referral(s): Orders Placed This Encounter  Procedures  . DG Lumbar Spine Complete  . DG Thoracic Spine 2 View  . DG Cervical Spine Complete  . ToxASSURE Select 13 (MW), Urine  . NCV with EMG(electromyography)   Imaging and/or referral(s): DG LUMBAR SPINE COMPLETE 4 +V DG THORACIC SPINE 2 VIEW DG CERVICAL SPINE COMPLETE  Interventional therapies: Planned, scheduled, and/or pending: Not at this time    Considering:  Diagnostic bilateral lumbar facetblock  Possible bilateral lumbar facet RFA Diagnostic left caudal Epiduralsteroid injection + diagnostic epidurogram Possible Racz procedure Diagnostic bilateral sacroiliacjoint injection  Possible bilateral sacroiliac joint RFA Diagnostic left cervical epiduralsteroid injection  Diagnostic bilateral cervical facetblock  Possible bilateral cervical facet RFA   Palliative PRN treatment(s):  Diagnostic bilateral lumbar facet blockunder fluoroscopic guidance and IV sedation   Diagnostic bilateral intra-articular hipjoint injection under fluoroscopic guidance and IV sedation  Diagnostic bilateral sacroiliacjoint block under fluoroscopic guidance and IV sedation        Provider-requested follow-up: Return in about 4 months (around 08/31/2017) for MedMgmt with Me Donella Stade Edison Pace).  Future Appointments  Date Time Provider [place]  05/18/2017 10:30 AM [name], MD [facility] None  07/07/2017  2:30 PM [facility] None  08/29/2017 10:45 AM Vevelyn Francois, NP [facility] None  09/13/2017  9:15 AM Hollice Espy, MD [facility] None  10/03/2017 10:00 AM [facility]  10/13/2017  2:15 PM [facility] None  10/13/2017  2:30 PM Sindy Guadeloupe, MD [facility] None   Primary Care Physician: Jodi Marble, MD Location: [facility] Note by: Vevelyn Francois NP Date: 05/04/2017; Time: 1:35 PM  Pain Score Disclaimer: We use the NRS-11 scale. This is a self-reported, subjective measurement of pain severity with only modest accuracy. It is used primarily to identify changes within a particular patient. It must be understood that outpatient pain scales are significantly less accurate that those used for research, where they can be applied under ideal controlled circumstances with minimal exposure to variables. In reality, the score is likely to be a combination of pain intensity and pain affect, where pain affect describes the degree of emotional arousal or changes in action readiness caused by the sensory experience of pain. Factors such as social and work situation, setting, emotional state, anxiety levels, expectation, and prior pain experience may influence pain perception and show large inter-individual differences that may also be affected by time variables.  Patient instructions provided during this appointment: Patient Instructions    ____________________________________________________________________________________________  Medication Rules  Applies to: All patients receiving prescriptions (written or electronic).  Pharmacy of record: Pharmacy where electronic prescriptions will be sent. If written prescriptions are taken to a different pharmacy, please inform the nursing staff. The pharmacy listed in the electronic medical record should be the one where you would like electronic prescriptions to be sent.  Prescription refills: Only during scheduled appointments. Applies to both, written and electronic prescriptions.  NOTE: The following applies primarily to controlled substances (Opioid* Pain Medications).   [name] responsibilities: 1. Pain Pills: Bring all pain pills to every appointment (except for procedure appointments). 2. Pill Bottles: Bring pills in original pharmacy bottle. Always bring newest bottle. Bring bottle, even if empty. 3. Medication refills: You are responsible for knowing and keeping track of what medications you need refilled. The day before your appointment, write a list of all prescriptions that need to be refilled. Bring that list to your appointment and give it to the admitting nurse. Prescriptions will be written only during appointments. If you forget a medication, it will not be "Called in", "Faxed", or "electronically sent". You will need to get another appointment to get these prescribed. 4. Prescription Accuracy: You are responsible for carefully inspecting your prescriptions before leaving our office. Have the discharge nurse carefully go over each prescription with you, before taking them home. Make sure that your name is accurately spelled, that your address is correct. Check the name and dose of your medication to make sure it is accurate. Check the number of pills, and the written instructions to make sure they are clear and accurate. Make sure that you are given enough medication to  last until your next medication refill appointment. 5. Taking Medication: Take medication as prescribed. Never take more pills than instructed. Never take medication more frequently than prescribed. Taking less pills or less frequently is permitted and encouraged, when it comes to controlled substances (written prescriptions).  6. Inform other Doctors: Always inform, all of your healthcare providers, of all the medications you take. 7. Pain Medication from other Providers: You are not allowed to accept any additional pain  medication from any other Doctor or Healthcare provider. There are two exceptions to this rule. (see below) In the event that you require additional pain medication, you are responsible for notifying us, as stated below. 8. Medication Agreement: You are responsible for carefully reading and following our Medication Agreement. This must be signed before receiving any prescriptions from our practice. Safely store a copy of your signed Agreement. Violations to the Agreement will result in no further prescriptions. (Additional copies of our Medication Agreement are available upon request.) 9. Laws, Rules, & Regulations: All patients are expected to follow all Federal and [employer], [employer], Rules, [employer]. Ignorance of the Laws does not constitute a valid excuse. The use of any illegal substances is prohibited. 10. Adopted CDC guidelines & recommendations: Target dosing levels will be at or below 60 MME/day. Use of benzodiazepines** is not recommended.  Exceptions: There are only two exceptions to the rule of not receiving pain medications from other Healthcare Providers. 1. Exception #1 (Emergencies): In the event of an emergency (i.e.: accident requiring emergency care), you are allowed to receive additional pain medication. However, you are responsible for: As soon as you are able, call our office [phone], at any time of the day or night, and leave a message stating your  name, the date and nature of the emergency, and the name and dose of the medication prescribed. In the event that your call is answered by a member of our staff, make sure to document and save the date, time, and the name of the person that took your information.  2. Exception #2 (Planned Surgery): In the event that you are scheduled by another doctor or dentist to have any type of surgery or procedure, you are allowed (for a period no longer than 30 days), to receive additional pain medication, for the acute post-op pain. However, in this case, you are responsible for picking up a copy of our "Post-op Pain Management for Surgeons" handout, and giving it to your surgeon or dentist. This document is available at our office, and does not require an appointment to obtain it. Simply go to our office during business hours (Monday-Thursday from 8:00 AM to 4:00 PM) (Friday 8:00 AM to 12:00 Noon) or if you have a scheduled appointment with [place], prior to your surgery, and ask for it by name. In addition, you will need to provide [place] with your name, name of your surgeon, type of surgery, and date of procedure or surgery.  *Opioid medications include: morphine, codeine, oxycodone, oxymorphone, hydrocodone, hydromorphone, meperidine, tramadol, tapentadol, buprenorphine, fentanyl, methadone. **Benzodiazepine medications include: diazepam (Valium), alprazolam (Xanax), clonazepam (Klonopine), lorazepam (Ativan), clorazepate (Tranxene), chlordiazepoxide (Librium), estazolam (Prosom), oxazepam (Serax), temazepam (Restoril), triazolam (Halcion)  ____________________________________________________________________________________________   ____________________________________________________________________________________________  Medication Rules  Applies to: All patients receiving prescriptions (written or electronic).  Pharmacy of record: Pharmacy where electronic prescriptions will be sent. If written prescriptions  are taken to a different pharmacy, please inform the nursing staff. The pharmacy listed in the electronic medical record should be the one where you would like electronic prescriptions to be sent.  Prescription refills: Only during scheduled appointments. Applies to both, written and electronic prescriptions.  NOTE: The following applies primarily to controlled substances (Opioid* Pain Medications).   [name] responsibilities: 11. Pain Pills: Bring all pain pills to every appointment (except for procedure appointments). 12. Pill Bottles: Bring pills in original pharmacy bottle. Always bring newest bottle. Bring bottle, even if empty. 13. Medication refills: You are responsible for knowing  and keeping track of what medications you need refilled. The day before your appointment, write a list of all prescriptions that need to be refilled. Bring that list to your appointment and give it to the admitting nurse. Prescriptions will be written only during appointments. If you forget a medication, it will not be "Called in", "Faxed", or "electronically sent". You will need to get another appointment to get these prescribed. 14. Prescription Accuracy: You are responsible for carefully inspecting your prescriptions before leaving our office. Have the discharge nurse carefully go over each prescription with you, before taking them home. Make sure that your name is accurately spelled, that your address is correct. Check the name and dose of your medication to make sure it is accurate. Check the number of pills, and the written instructions to make sure they are clear and accurate. Make sure that you are given enough medication to last until your next medication refill appointment. 15. Taking Medication: Take medication as prescribed. Never take more pills than instructed. Never take medication more frequently than prescribed. Taking less pills or less frequently is permitted and encouraged, when it comes to  controlled substances (written prescriptions).  16. Inform other Doctors: Always inform, all of your healthcare providers, of all the medications you take. 17. Pain Medication from other Providers: You are not allowed to accept any additional pain medication from any other Doctor or Healthcare provider. There are two exceptions to this rule. (see below) In the event that you require additional pain medication, you are responsible for notifying us, as stated below. 18. Medication Agreement: You are responsible for carefully reading and following our Medication Agreement. This must be signed before receiving any prescriptions from our practice. Safely store a copy of your signed Agreement. Violations to the Agreement will result in no further prescriptions. (Additional copies of our Medication Agreement are available upon request.) 19. Laws, Rules, & Regulations: All patients are expected to follow all Federal and [employer], [employer], Rules, [employer]. Ignorance of the Laws does not constitute a valid excuse. The use of any illegal substances is prohibited. 20. Adopted CDC guidelines & recommendations: Target dosing levels will be at or below 60 MME/day. Use of benzodiazepines** is not recommended.  Exceptions: There are only two exceptions to the rule of not receiving pain medications from other Healthcare Providers. 3. Exception #1 (Emergencies): In the event of an emergency (i.e.: accident requiring emergency care), you are allowed to receive additional pain medication. However, you are responsible for: As soon as you are able, call our office [phone], at any time of the day or night, and leave a message stating your name, the date and nature of the emergency, and the name and dose of the medication prescribed. In the event that your call is answered by a member of our staff, make sure to document and save the date, time, and the name of the person that took your information.  4. Exception #2  (Planned Surgery): In the event that you are scheduled by another doctor or dentist to have any type of surgery or procedure, you are allowed (for a period no longer than 30 days), to receive additional pain medication, for the acute post-op pain. However, in this case, you are responsible for picking up a copy of our "Post-op Pain Management for Surgeons" handout, and giving it to your surgeon or dentist. This document is available at our office, and does not require an appointment to obtain it. Simply go to our office  during business hours (Monday-Thursday from 8:00 AM to 4:00 PM) (Friday 8:00 AM to 12:00 Noon) or if you have a scheduled appointment with [place], prior to your surgery, and ask for it by name. In addition, you will need to provide [place] with your name, name of your surgeon, type of surgery, and date of procedure or surgery.  *Opioid medications include: morphine, codeine, oxycodone, oxymorphone, hydrocodone, hydromorphone, meperidine, tramadol, tapentadol, buprenorphine, fentanyl, methadone. **Benzodiazepine medications include: diazepam (Valium), alprazolam (Xanax), clonazepam (Klonopine), lorazepam (Ativan), clorazepate (Tranxene), chlordiazepoxide (Librium), estazolam (Prosom), oxazepam (Serax), temazepam (Restoril), triazolam (Halcion)  ____________________________________________________________________________________________

## 2017-05-04 NOTE — Patient Instructions (Signed)
____________________________________________________________________________________________  Medication Rules  Applies to: All patients receiving prescriptions (written or electronic).  Pharmacy of record: Pharmacy where electronic prescriptions will be sent. If written prescriptions are taken to a different pharmacy, please inform the nursing staff. The pharmacy listed in the electronic medical record should be the one where you would like electronic prescriptions to be sent.  Prescription refills: Only during scheduled appointments. Applies to both, written and electronic prescriptions.  NOTE: The following applies primarily to controlled substances (Opioid* Pain Medications).   [name] responsibilities: 1. Pain Pills: Bring all pain pills to every appointment (except for procedure appointments). 2. Pill Bottles: Bring pills in original pharmacy bottle. Always bring newest bottle. Bring bottle, even if empty. 3. Medication refills: You are responsible for knowing and keeping track of what medications you need refilled. The day before your appointment, write a list of all prescriptions that need to be refilled. Bring that list to your appointment and give it to the admitting nurse. Prescriptions will be written only during appointments. If you forget a medication, it will not be "Called in", "Faxed", or "electronically sent". You will need to get another appointment to get these prescribed. 4. Prescription Accuracy: You are responsible for carefully inspecting your prescriptions before leaving our office. Have the discharge nurse carefully go over each prescription with you, before taking them home. Make sure that your name is accurately spelled, that your address is correct. Check the name and dose of your medication to make sure it is accurate. Check the number of pills, and the written instructions to make sure they are clear and accurate. Make sure that you are given enough medication to  last until your next medication refill appointment. 5. Taking Medication: Take medication as prescribed. Never take more pills than instructed. Never take medication more frequently than prescribed. Taking less pills or less frequently is permitted and encouraged, when it comes to controlled substances (written prescriptions).  6. Inform other Doctors: Always inform, all of your healthcare providers, of all the medications you take. 7. Pain Medication from other Providers: You are not allowed to accept any additional pain medication from any other Doctor or Healthcare provider. There are two exceptions to this rule. (see below) In the event that you require additional pain medication, you are responsible for notifying us, as stated below. 8. Medication Agreement: You are responsible for carefully reading and following our Medication Agreement. This must be signed before receiving any prescriptions from our practice. Safely store a copy of your signed Agreement. Violations to the Agreement will result in no further prescriptions. (Additional copies of our Medication Agreement are available upon request.) 9. Laws, Rules, & Regulations: All patients are expected to follow all Federal and State Laws, Statutes, Rules, & Regulations. Ignorance of the Laws does not constitute a valid excuse. The use of any illegal substances is prohibited. 10. Adopted CDC guidelines & recommendations: Target dosing levels will be at or below 60 MME/day. Use of benzodiazepines** is not recommended.  Exceptions: There are only two exceptions to the rule of not receiving pain medications from other Healthcare Providers. 1. Exception #1 (Emergencies): In the event of an emergency (i.e.: accident requiring emergency care), you are allowed to receive additional pain medication. However, you are responsible for: As soon as you are able, call our office [phone], at any time of the day or night, and leave a message stating your  name, the date and nature of the emergency, and the name and dose of the medication   prescribed. In the event that your call is answered by a member of our staff, make sure to document and save the date, time, and the name of the person that took your information.  2. Exception #2 (Planned Surgery): In the event that you are scheduled by another doctor or dentist to have any type of surgery or procedure, you are allowed (for a period no longer than 30 days), to receive additional pain medication, for the acute post-op pain. However, in this case, you are responsible for picking up a copy of our "Post-op Pain Management for Surgeons" handout, and giving it to your surgeon or dentist. This document is available at our office, and does not require an appointment to obtain it. Simply go to our office during business hours (Monday-Thursday from 8:00 AM to 4:00 PM) (Friday 8:00 AM to 12:00 Noon) or if you have a scheduled appointment with us, prior to your surgery, and ask for it by name. In addition, you will need to provide us with your name, name of your surgeon, type of surgery, and date of procedure or surgery.  *Opioid medications include: morphine, codeine, oxycodone, oxymorphone, hydrocodone, hydromorphone, meperidine, tramadol, tapentadol, buprenorphine, fentanyl, methadone. **Benzodiazepine medications include: diazepam (Valium), alprazolam (Xanax), clonazepam (Klonopine), lorazepam (Ativan), clorazepate (Tranxene), chlordiazepoxide (Librium), estazolam (Prosom), oxazepam (Serax), temazepam (Restoril), triazolam (Halcion)  ____________________________________________________________________________________________   ____________________________________________________________________________________________  Medication Rules  Applies to: All patients receiving prescriptions (written or electronic).  Pharmacy of record: Pharmacy where electronic prescriptions will be sent. If written prescriptions  are taken to a different pharmacy, please inform the nursing staff. The pharmacy listed in the electronic medical record should be the one where you would like electronic prescriptions to be sent.  Prescription refills: Only during scheduled appointments. Applies to both, written and electronic prescriptions.  NOTE: The following applies primarily to controlled substances (Opioid* Pain Medications).   [name] responsibilities: 11. Pain Pills: Bring all pain pills to every appointment (except for procedure appointments). 12. Pill Bottles: Bring pills in original pharmacy bottle. Always bring newest bottle. Bring bottle, even if empty. 13. Medication refills: You are responsible for knowing and keeping track of what medications you need refilled. The day before your appointment, write a list of all prescriptions that need to be refilled. Bring that list to your appointment and give it to the admitting nurse. Prescriptions will be written only during appointments. If you forget a medication, it will not be "Called in", "Faxed", or "electronically sent". You will need to get another appointment to get these prescribed. 14. Prescription Accuracy: You are responsible for carefully inspecting your prescriptions before leaving our office. Have the discharge nurse carefully go over each prescription with you, before taking them home. Make sure that your name is accurately spelled, that your address is correct. Check the name and dose of your medication to make sure it is accurate. Check the number of pills, and the written instructions to make sure they are clear and accurate. Make sure that you are given enough medication to last until your next medication refill appointment. 15. Taking Medication: Take medication as prescribed. Never take more pills than instructed. Never take medication more frequently than prescribed. Taking less pills or less frequently is permitted and encouraged, when it comes to  controlled substances (written prescriptions).  16. Inform other Doctors: Always inform, all of your healthcare providers, of all the medications you take. 17. Pain Medication from other Providers: You are not allowed to accept any additional pain medication from any other   Doctor or Healthcare provider. There are two exceptions to this rule. (see below) In the event that you require additional pain medication, you are responsible for notifying us, as stated below. 18. Medication Agreement: You are responsible for carefully reading and following our Medication Agreement. This must be signed before receiving any prescriptions from our practice. Safely store a copy of your signed Agreement. Violations to the Agreement will result in no further prescriptions. (Additional copies of our Medication Agreement are available upon request.) 19. Laws, Rules, & Regulations: All patients are expected to follow all Federal and State Laws, Statutes, Rules, & Regulations. Ignorance of the Laws does not constitute a valid excuse. The use of any illegal substances is prohibited. 20. Adopted CDC guidelines & recommendations: Target dosing levels will be at or below 60 MME/day. Use of benzodiazepines** is not recommended.  Exceptions: There are only two exceptions to the rule of not receiving pain medications from other Healthcare Providers. 3. Exception #1 (Emergencies): In the event of an emergency (i.e.: accident requiring emergency care), you are allowed to receive additional pain medication. However, you are responsible for: As soon as you are able, call our office [phone], at any time of the day or night, and leave a message stating your name, the date and nature of the emergency, and the name and dose of the medication prescribed. In the event that your call is answered by a member of our staff, make sure to document and save the date, time, and the name of the person that took your information.  4. Exception #2  (Planned Surgery): In the event that you are scheduled by another doctor or dentist to have any type of surgery or procedure, you are allowed (for a period no longer than 30 days), to receive additional pain medication, for the acute post-op pain. However, in this case, you are responsible for picking up a copy of our "Post-op Pain Management for Surgeons" handout, and giving it to your surgeon or dentist. This document is available at our office, and does not require an appointment to obtain it. Simply go to our office during business hours (Monday-Thursday from 8:00 AM to 4:00 PM) (Friday 8:00 AM to 12:00 Noon) or if you have a scheduled appointment with us, prior to your surgery, and ask for it by name. In addition, you will need to provide us with your name, name of your surgeon, type of surgery, and date of procedure or surgery.  *Opioid medications include: morphine, codeine, oxycodone, oxymorphone, hydrocodone, hydromorphone, meperidine, tramadol, tapentadol, buprenorphine, fentanyl, methadone. **Benzodiazepine medications include: diazepam (Valium), alprazolam (Xanax), clonazepam (Klonopine), lorazepam (Ativan), clorazepate (Tranxene), chlordiazepoxide (Librium), estazolam (Prosom), oxazepam (Serax), temazepam (Restoril), triazolam (Halcion)  ____________________________________________________________________________________________   

## 2017-05-04 NOTE — Progress Notes (Signed)
Nursing Pain Medication Assessment:  Safety precautions to be maintained throughout the outpatient stay will include: orient to surroundings, keep bed in low position, maintain call bell within reach at all times, provide assistance with transfer out of bed and ambulation.  Medication Inspection Compliance: Pill count conducted under aseptic conditions, in front of the patient. Neither the pills nor the bottle was removed from the patient's sight at any time. Once count was completed pills were immediately returned to the patient in their original bottle.  Medication: Oxycodone IR Pill/Patch Count: 52 of 120 pills remain Pill/Patch Appearance: Markings consistent with prescribed medication Bottle Appearance: Standard pharmacy container. Clearly labeled. Filled Date: 40 / 21 / 2018 Last Medication intake:  Today

## 2017-05-10 LAB — TOXASSURE SELECT 13 (MW), URINE

## 2017-05-17 NOTE — Progress Notes (Deleted)
[name] Name: Audrey Garrett  MRN: [id number]  Referring Provider: Jodi Marble, MD  DOB: December 19, 1960  PCP: Jodi Marble, MD  DOS: 05/18/2017  Note by: Gaspar Cola, MD  Service setting: Ambulatory outpatient  Specialty: Interventional Pain Management  Location: [facility] (AMB) Pain Management Facility    Patient type: Established   Primary Reason(s) for Visit: Evaluation of chronic illnesses with exacerbation, or progression (Level of risk: moderate) CC: No chief complaint on file.  HPI  Audrey Garrett is a 57 y.o. year old, female patient, who comes today for a follow-up evaluation. She has Insomnia, persistent; BP (high blood pressure); Cervical radicular pain; Uncomplicated opioid dependence (Audrey Garrett); Long term prescription opiate use; Failed back surgical syndrome; Lumbar facet syndrome (Bilateral) (L>R); Osteoarthritis of spine with radiculopathy, lumbar region; Musculoskeletal pain; Chronic low back pain (Primary Source of Pain) (Bilateral) (L>R); HCV (hepatitis C virus); At risk for osteopenia; Lumbar spondylosis; Chronic lumbar radicular pain (Secondary source of pain) (Left); Chronic neck pain; Chronic lower extremity pain (Left); Chronic sacroiliac joint pain (Bilateral) (R>L); Chronic upper back pain; Long term current use of opiate analgesic; Encounter for chronic pain management; Hypomagnesemia (low magnesium levels); Hypotension; Anemia; Thrombocytopenia ([place]); Coagulopathy ([place]); Hyperglycemia; Polyneuropathy; Urinary retention; Difficulty urinating; Nephrolithiasis; Alcoholic cirrhosis of liver with ascites ([place]); Depression, major, recurrent, moderate ([place]); Abdominal pain, chronic, epigastric; Hepatic cirrhosis ([place]); Acute GI bleeding; Neurogenic pain; S/P thoracolumbar fusion (T5-L5); Grade 1 Retrolisthesis (4 mm) of C5 over C6 and C6 over C7; Cervical foraminal stenosis (Left C5-6; Bilateral C6-7); Hernia of anterior abdominal wall; Incarcerated umbilical hernia; Controlled type 2  diabetes mellitus with hyperglycemia ([place]); Acute blood loss anemia; Chronic pain syndrome; Malnutrition of moderate degree; Iron deficiency anemia due to chronic blood loss; Opiate use; RLS (restless legs syndrome); Bilateral leg edema; Depression, major, in remission ([place]); Chronic hip pain (Bilateral); Osteoarthritis of hip (Bilateral); Personal history of tobacco use, presenting hazards to health; Chronic groin pain, left; Malignant neoplasm of upper lobe of right lung (Audrey Garrett); Acute postoperative pain; Chronic hepatitis C without hepatic coma ([place]); and Cirrhosis of liver with ascites ([place]) on their problem list. Audrey Garrett was last seen on 01/04/2017. Her primarily concern today is the No chief complaint on file.  Pain Assessment: Location:     Radiating:   Onset:   Duration:   Quality:   Severity:  /10 (self-reported pain score)  Note: Reported level is compatible with observation.                         When using our objective Pain Scale, levels between 6 and 10/10 are said to belong in an emergency room, as it progressively worsens from a 6/10, described as severely limiting, requiring emergency care not usually available at an outpatient pain management facility. At a 6/10 level, communication becomes difficult and requires great effort. Assistance to reach the emergency department may be required. Facial flushing and profuse sweating along with potentially dangerous increases in heart rate and blood pressure will be evident. Effect on ADL:   Timing:   Modifying factors:    Further details on both, my assessment(s), as well as the proposed treatment plan, please see below.  Laboratory Chemistry  Inflammation Markers (CRP: Acute Phase) (ESR: Chronic Phase) Lab Results  Component Value Date   CRP <0.5 04/03/2015   ESRSEDRATE 27 04/03/2015   LATICACIDVEN 2.7 (HH) 04/23/2017  Renal Function Markers Lab Results  Component Value Date   BUN 11 04/23/2017    CREATININE 0.72 04/23/2017   GFRAA >60 04/23/2017   GFRNONAA >60 04/23/2017                 Hepatic Function Markers Lab Results  Component Value Date   AST 68 (H) 04/23/2017   ALT 35 04/23/2017   ALBUMIN 4.2 04/23/2017   ALKPHOS 90 04/23/2017   LIPASE 32 11/06/2015   AMMONIA 12 09/14/2016                 Electrolytes Lab Results  Component Value Date   NA 130 (L) 04/23/2017   K 4.0 04/23/2017   CL 93 (L) 04/23/2017   CALCIUM 8.8 (L) 04/23/2017   MG 1.9 01/22/2016   PHOS 3.6 11/23/2013                        Neuropathy Markers Lab Results  Component Value Date   VITAMINB12 557 01/03/2017   FOLATE 13.5 01/03/2017   HGBA1C 5.3 05/22/2015                 Coagulation Parameters Lab Results  Component Value Date   INR 1.29 11/19/2016   LABPROT 16.2 (H) 11/19/2016   APTT 34 01/20/2016   PLT 136 (L) 04/23/2017                 Cardiovascular Markers Lab Results  Component Value Date   BNP 929 (H) 09/23/2013   CKTOTAL 1,597 (H) 12/17/2012   TROPONINI <0.03 05/07/2015   HGB 12.2 04/23/2017   HCT 35.2 04/23/2017                 Note: Lab results reviewed.  Recent Diagnostic Imaging Review  Cervical Imaging: Cervical CT w contrast:  Results for orders placed during the hospital encounter of 11/29/13  CT Cervical Spine W Contrast   Narrative CLINICAL DATA:  Neck and back pain.  Bilateral lower extremity pain.  FLUOROSCOPY TIME:  1 min 45 seconds  PROCEDURE: LUMBAR PUNCTURE FOR CERVICAL LUMBAR AND THORACIC MYELOGRAM  CERVICAL AND LUMBAR AND THORACIC MYELOGRAM  CT CERVICAL MYELOGRAM  CT LUMBAR MYELOGRAM  CT THORACIC MYELOGRAM  After thorough discussion of risks and benefits of the procedure including bleeding, infection, injury to nerves, blood vessels, adjacent structures as well as headache and CSF leak, written and oral informed consent was obtained. Consent was obtained by Dr. Logan Bores.  Patient was positioned prone on the fluoroscopy table.  Local anesthesia was provided with 1% lidocaine without epinephrine after prepped and draped in the usual sterile fashion. Puncture was performed at L3-4 using a 3 1/2 inch 22-gauge spinal needle via a midline approach. Using a single pass through the dura, the needle was placed within the thecal sac, with return of clear CSF. 10 mL Omnipaque-300 was injected into the thecal sac, with normal opacification of the nerve roots and cauda equina consistent with free flow within the subarachnoid space. The patient was then moved to the trendelenburg position and contrast flowed into the Thoracic and Cervical spine regions.  I personally performed the lumbar puncture and administered the intrathecal contrast. I also personally supervised acquisition of the myelogram images.  TECHNIQUE: Contiguous axial images were obtained through the Cervical, Thoracic, and Lumbar spine after the intrathecal infusion of contrast. Coronal and sagittal reconstructions were obtained of the axial image sets.  FINDINGS: CERVICAL, THORACIC AND LUMBAR MYELOGRAM FINDINGS:  There is slight retrolisthesis of C5 on C6. Moderate disc space narrowing is present at C5-6 and C6-7 with moderate end plate osteophyte formation, resulting in moderate ventral epidural defects at each level. Thoracic vertebral alignment is normal. Vertebral body heights are preserved without evidence of compression fracture. Extensive posterior spinal fusion has been performed from T5 through the lumbar spine and sacrum with fixation screws in the ilia as well. No thoracic spinal stenosis is identified. There is mild straightening of the normal lumbar lordosis without listhesis on upright neutral, flexion, and extension imaging. Mild disc space narrowing is present at L2-3 and L3-4. Dilated nerve root sleeves are noted bilaterally at S1.  CT CERVICAL MYELOGRAM FINDINGS:  There is straightening of the normal cervical lordosis. There  is grade 1 retrolisthesis of C5 on C6 and C6 on C7, measuring approximately 4 mm each. Moderate disc space narrowing and endplate osteophyte formation are present at these 2 levels. The cervical spinal canal is capacious on a developmental basis. The cervical spinal cord is normal in caliber. Mild scarring is noted in the lung apices.  C2-3 through C4-5:  Negative.  C5-6: Broad-based posterior disc osteophyte complex mildly effaces the ventral thecal sac without spinal stenosis. Asymmetric right-sided uncovertebral arthrosis results in moderate right neural foraminal stenosis.  C6-7: Broad-based posterior disc osteophyte complex mildly effaces the ventral thecal sac without spinal canal stenosis. Spinal canal maintains an AP diameter of 10 mm. Uncovertebral arthrosis results in mild bilateral neural foraminal stenosis.  C7-T1: No disc herniation or stenosis. Small left-sided cervical rib which is fused with the proximal aspect of the first thoracic rib.  CT THORACIC MYELOGRAM FINDINGS:  Prior thoracolumbar fusion has been performed. Bilateral pedicle screws are present at T5, T6, T8, T10, and T12. The right T5 screw courses lateral to the pedicle and terminates lateral to the vertebral body. The left T6 screw traverses the lateral portion of the pedicle, terminating slightly lateral to the vertebral body. The right T12 screw partially traverses the right lateral recess. There is no evidence of screw loosening. Bilateral posterior fusion masses are present.  The thoracic spinal canal is overall capacious. There is a small central disc protrusion at T7-8 which may contact the spinal cord without spinal stenosis or mass effect on the cord. Similar, small central disc protrusions are also seen T8-9 and T9-10. There is mild circumferential disc bulging at T10-11 without spinal stenosis. There is also mild disc bulging at T11-12, without evidence of spinal stenosis although  evaluation is partially limited at this level due to dependent layering of contrast in the dorsal canal. There is mild right neural foraminal stenosis at T10-11 due to prominent facet arthrosis.  There are small bilateral pleural effusions. Dependent subsegmental atelectasis is present in both lower lobes.  CT LUMBAR MYELOGRAM FINDINGS:  There is mild straightening of the normal lumbar lordosis. There is no listhesis. Vertebral body heights are preserved. Mild disc space narrowing is present at L2-3 and L3-4. Thoracolumbar fusion hardware is again seen, with bilateral pedicle screws from L1-L5 as well as at S1. Screws also extend into the ilia. There is no evidence of screw loosening. 12 mm, well-circumscribed lucency in the L4 vertebral body may represent a hemangioma. Contrast was primarily layering dependently in the lower lumbar spine and sacral canal during initial supine imaging of the entire spine. Repeat imaging of the lumbar spine with the patient prone yielded better thecal sac opacification. Bilateral S1 and right S2 and S3 nerve root sleeves are dilated.  Moderate atherosclerotic aortic calcification is noted. Punctate calculi are noted in both kidneys without hydronephrosis.  L1-2: Prior posterior decompression. No disc herniation or stenosis.  L2-3: Prior posterior decompression. Mild circumferential disc bulge without stenosis.  L3-4: Prior posterior decompression. Minimal disc bulging and endplate spurring without stenosis.  L4-5: Prior posterior decompression. Small left foraminal disc protrusion without stenosis.  L5-S1: Prior posterior decompression. Mild disc bulge without stenosis.  IMPRESSION: 1. Moderate cervical spondylosis at C5-6 and C6-7 without cervical spinal stenosis. Uncovertebral arthrosis results in moderate right neural foraminal stenosis at C5-6 and mild bilateral neural foraminal stenosis at C6-7. 2. Prior posterior fusion from T5 through  the sacrum as above. Right T12 screw partially traverses the right lateral recess. 3. Mild lower thoracic spondylosis without thoracic spinal stenosis. Mild right neural foraminal stenosis at T10-11 due to asymmetric facet arthrosis. 4. Decompressed lumbar spinal canal. Mild, diffuse lumbar spondylosis without stenosis.   Electronically Signed   By: Logan Bores   On: 11/29/2013 17:04    Thoracic Imaging: Thoracic CT w contrast:  Results for orders placed during the hospital encounter of 11/29/13  CT Thoracic Spine W Contrast   Narrative CLINICAL DATA:  Neck and back pain.  Bilateral lower extremity pain.  FLUOROSCOPY TIME:  1 min 45 seconds  PROCEDURE: LUMBAR PUNCTURE FOR CERVICAL LUMBAR AND THORACIC MYELOGRAM  CERVICAL AND LUMBAR AND THORACIC MYELOGRAM  CT CERVICAL MYELOGRAM  CT LUMBAR MYELOGRAM  CT THORACIC MYELOGRAM  After thorough discussion of risks and benefits of the procedure including bleeding, infection, injury to nerves, blood vessels, adjacent structures as well as headache and CSF leak, written and oral informed consent was obtained. Consent was obtained by Dr. Logan Bores.  Patient was positioned prone on the fluoroscopy table. Local anesthesia was provided with 1% lidocaine without epinephrine after prepped and draped in the usual sterile fashion. Puncture was performed at L3-4 using a 3 1/2 inch 22-gauge spinal needle via a midline approach. Using a single pass through the dura, the needle was placed within the thecal sac, with return of clear CSF. 10 mL Omnipaque-300 was injected into the thecal sac, with normal opacification of the nerve roots and cauda equina consistent with free flow within the subarachnoid space. The patient was then moved to the trendelenburg position and contrast flowed into the Thoracic and Cervical spine regions.  I personally performed the lumbar puncture and administered the intrathecal contrast. I also personally  supervised acquisition of the myelogram images.  TECHNIQUE: Contiguous axial images were obtained through the Cervical, Thoracic, and Lumbar spine after the intrathecal infusion of contrast. Coronal and sagittal reconstructions were obtained of the axial image sets.  FINDINGS: CERVICAL, THORACIC AND LUMBAR MYELOGRAM FINDINGS:  There is slight retrolisthesis of C5 on C6. Moderate disc space narrowing is present at C5-6 and C6-7 with moderate end plate osteophyte formation, resulting in moderate ventral epidural defects at each level. Thoracic vertebral alignment is normal. Vertebral body heights are preserved without evidence of compression fracture. Extensive posterior spinal fusion has been performed from T5 through the lumbar spine and sacrum with fixation screws in the ilia as well. No thoracic spinal stenosis is identified. There is mild straightening of the normal lumbar lordosis without listhesis on upright neutral, flexion, and extension imaging. Mild disc space narrowing is present at L2-3 and L3-4. Dilated nerve root sleeves are noted bilaterally at S1.  CT CERVICAL MYELOGRAM FINDINGS:  There is straightening of the normal cervical lordosis. There is grade 1 retrolisthesis of C5 on  C6 and C6 on C7, measuring approximately 4 mm each. Moderate disc space narrowing and endplate osteophyte formation are present at these 2 levels. The cervical spinal canal is capacious on a developmental basis. The cervical spinal cord is normal in caliber. Mild scarring is noted in the lung apices.  C2-3 through C4-5:  Negative.  C5-6: Broad-based posterior disc osteophyte complex mildly effaces the ventral thecal sac without spinal stenosis. Asymmetric right-sided uncovertebral arthrosis results in moderate right neural foraminal stenosis.  C6-7: Broad-based posterior disc osteophyte complex mildly effaces the ventral thecal sac without spinal canal stenosis. Spinal canal maintains  an AP diameter of 10 mm. Uncovertebral arthrosis results in mild bilateral neural foraminal stenosis.  C7-T1: No disc herniation or stenosis. Small left-sided cervical rib which is fused with the proximal aspect of the first thoracic rib.  CT THORACIC MYELOGRAM FINDINGS:  Prior thoracolumbar fusion has been performed. Bilateral pedicle screws are present at T5, T6, T8, T10, and T12. The right T5 screw courses lateral to the pedicle and terminates lateral to the vertebral body. The left T6 screw traverses the lateral portion of the pedicle, terminating slightly lateral to the vertebral body. The right T12 screw partially traverses the right lateral recess. There is no evidence of screw loosening. Bilateral posterior fusion masses are present.  The thoracic spinal canal is overall capacious. There is a small central disc protrusion at T7-8 which may contact the spinal cord without spinal stenosis or mass effect on the cord. Similar, small central disc protrusions are also seen T8-9 and T9-10. There is mild circumferential disc bulging at T10-11 without spinal stenosis. There is also mild disc bulging at T11-12, without evidence of spinal stenosis although evaluation is partially limited at this level due to dependent layering of contrast in the dorsal canal. There is mild right neural foraminal stenosis at T10-11 due to prominent facet arthrosis.  There are small bilateral pleural effusions. Dependent subsegmental atelectasis is present in both lower lobes.  CT LUMBAR MYELOGRAM FINDINGS:  There is mild straightening of the normal lumbar lordosis. There is no listhesis. Vertebral body heights are preserved. Mild disc space narrowing is present at L2-3 and L3-4. Thoracolumbar fusion hardware is again seen, with bilateral pedicle screws from L1-L5 as well as at S1. Screws also extend into the ilia. There is no evidence of screw loosening. 12 mm, well-circumscribed lucency in the  L4 vertebral body may represent a hemangioma. Contrast was primarily layering dependently in the lower lumbar spine and sacral canal during initial supine imaging of the entire spine. Repeat imaging of the lumbar spine with the patient prone yielded better thecal sac opacification. Bilateral S1 and right S2 and S3 nerve root sleeves are dilated. Moderate atherosclerotic aortic calcification is noted. Punctate calculi are noted in both kidneys without hydronephrosis.  L1-2: Prior posterior decompression. No disc herniation or stenosis.  L2-3: Prior posterior decompression. Mild circumferential disc bulge without stenosis.  L3-4: Prior posterior decompression. Minimal disc bulging and endplate spurring without stenosis.  L4-5: Prior posterior decompression. Small left foraminal disc protrusion without stenosis.  L5-S1: Prior posterior decompression. Mild disc bulge without stenosis.  IMPRESSION: 1. Moderate cervical spondylosis at C5-6 and C6-7 without cervical spinal stenosis. Uncovertebral arthrosis results in moderate right neural foraminal stenosis at C5-6 and mild bilateral neural foraminal stenosis at C6-7. 2. Prior posterior fusion from T5 through the sacrum as above. Right T12 screw partially traverses the right lateral recess. 3. Mild lower thoracic spondylosis without thoracic spinal stenosis. Mild right neural  foraminal stenosis at T10-11 due to asymmetric facet arthrosis. 4. Decompressed lumbar spinal canal. Mild, diffuse lumbar spondylosis without stenosis.   Electronically Signed   By: Logan Bores   On: 11/29/2013 17:04    Thoracic DG 2-3 views:  Results for orders placed in visit on 01/06/15  DG Thoracic Spine 2 View   Lumbosacral Imaging: Lumbar CT wo contrast:  Results for orders placed during the hospital encounter of 09/22/15  CT LUMBAR SPINE WO CONTRAST   Narrative CLINICAL DATA:  Acute exacerbation of chronic back pain.  EXAM: CT LUMBAR SPINE  WITHOUT CONTRAST  TECHNIQUE: Multidetector CT imaging of the lumbar spine was performed without intravenous contrast administration. Multiplanar CT image reconstructions were also generated.  COMPARISON:  Lumbar radiographs from 6 10/2015  FINDINGS: There has been previous posterior fusion using pedicle screws and rods from the lower thoracic region to the sacrum and pelvis. Within limits for detection on noncontrast CT lumbar, I do not see an acute fracture, intraspinal hematoma, or lumbar compressive lesion. No gross hardware malposition. Solid posterior arthrodesis through least the L1 through S1 segments.  Aortic atherosclerosis. Renal cortical calcifications, incompletely evaluated. Suspected gallstones.  IMPRESSION: No acute findings status post thoracosacral fusion.   Electronically Signed   By: Staci Righter M.D.   On: 09/25/2015 18:42    Lumbar CT w contrast:  Results for orders placed during the hospital encounter of 11/29/13  CT Lumbar Spine W Contrast   Narrative CLINICAL DATA:  Neck and back pain.  Bilateral lower extremity pain.  FLUOROSCOPY TIME:  1 min 45 seconds  PROCEDURE: LUMBAR PUNCTURE FOR CERVICAL LUMBAR AND THORACIC MYELOGRAM  CERVICAL AND LUMBAR AND THORACIC MYELOGRAM  CT CERVICAL MYELOGRAM  CT LUMBAR MYELOGRAM  CT THORACIC MYELOGRAM  After thorough discussion of risks and benefits of the procedure including bleeding, infection, injury to nerves, blood vessels, adjacent structures as well as headache and CSF leak, written and oral informed consent was obtained. Consent was obtained by Dr. Logan Bores.  Patient was positioned prone on the fluoroscopy table. Local anesthesia was provided with 1% lidocaine without epinephrine after prepped and draped in the usual sterile fashion. Puncture was performed at L3-4 using a 3 1/2 inch 22-gauge spinal needle via a midline approach. Using a single pass through the dura, the needle was placed  within the thecal sac, with return of clear CSF. 10 mL Omnipaque-300 was injected into the thecal sac, with normal opacification of the nerve roots and cauda equina consistent with free flow within the subarachnoid space. The patient was then moved to the trendelenburg position and contrast flowed into the Thoracic and Cervical spine regions.  I personally performed the lumbar puncture and administered the intrathecal contrast. I also personally supervised acquisition of the myelogram images.  TECHNIQUE: Contiguous axial images were obtained through the Cervical, Thoracic, and Lumbar spine after the intrathecal infusion of contrast. Coronal and sagittal reconstructions were obtained of the axial image sets.  FINDINGS: CERVICAL, THORACIC AND LUMBAR MYELOGRAM FINDINGS:  There is slight retrolisthesis of C5 on C6. Moderate disc space narrowing is present at C5-6 and C6-7 with moderate end plate osteophyte formation, resulting in moderate ventral epidural defects at each level. Thoracic vertebral alignment is normal. Vertebral body heights are preserved without evidence of compression fracture. Extensive posterior spinal fusion has been performed from T5 through the lumbar spine and sacrum with fixation screws in the ilia as well. No thoracic spinal stenosis is identified. There is mild straightening of the normal lumbar  lordosis without listhesis on upright neutral, flexion, and extension imaging. Mild disc space narrowing is present at L2-3 and L3-4. Dilated nerve root sleeves are noted bilaterally at S1.  CT CERVICAL MYELOGRAM FINDINGS:  There is straightening of the normal cervical lordosis. There is grade 1 retrolisthesis of C5 on C6 and C6 on C7, measuring approximately 4 mm each. Moderate disc space narrowing and endplate osteophyte formation are present at these 2 levels. The cervical spinal canal is capacious on a developmental basis. The cervical spinal cord is normal in  caliber. Mild scarring is noted in the lung apices.  C2-3 through C4-5:  Negative.  C5-6: Broad-based posterior disc osteophyte complex mildly effaces the ventral thecal sac without spinal stenosis. Asymmetric right-sided uncovertebral arthrosis results in moderate right neural foraminal stenosis.  C6-7: Broad-based posterior disc osteophyte complex mildly effaces the ventral thecal sac without spinal canal stenosis. Spinal canal maintains an AP diameter of 10 mm. Uncovertebral arthrosis results in mild bilateral neural foraminal stenosis.  C7-T1: No disc herniation or stenosis. Small left-sided cervical rib which is fused with the proximal aspect of the first thoracic rib.  CT THORACIC MYELOGRAM FINDINGS:  Prior thoracolumbar fusion has been performed. Bilateral pedicle screws are present at T5, T6, T8, T10, and T12. The right T5 screw courses lateral to the pedicle and terminates lateral to the vertebral body. The left T6 screw traverses the lateral portion of the pedicle, terminating slightly lateral to the vertebral body. The right T12 screw partially traverses the right lateral recess. There is no evidence of screw loosening. Bilateral posterior fusion masses are present.  The thoracic spinal canal is overall capacious. There is a small central disc protrusion at T7-8 which may contact the spinal cord without spinal stenosis or mass effect on the cord. Similar, small central disc protrusions are also seen T8-9 and T9-10. There is mild circumferential disc bulging at T10-11 without spinal stenosis. There is also mild disc bulging at T11-12, without evidence of spinal stenosis although evaluation is partially limited at this level due to dependent layering of contrast in the dorsal canal. There is mild right neural foraminal stenosis at T10-11 due to prominent facet arthrosis.  There are small bilateral pleural effusions. Dependent subsegmental atelectasis is present in  both lower lobes.  CT LUMBAR MYELOGRAM FINDINGS:  There is mild straightening of the normal lumbar lordosis. There is no listhesis. Vertebral body heights are preserved. Mild disc space narrowing is present at L2-3 and L3-4. Thoracolumbar fusion hardware is again seen, with bilateral pedicle screws from L1-L5 as well as at S1. Screws also extend into the ilia. There is no evidence of screw loosening. 12 mm, well-circumscribed lucency in the L4 vertebral body may represent a hemangioma. Contrast was primarily layering dependently in the lower lumbar spine and sacral canal during initial supine imaging of the entire spine. Repeat imaging of the lumbar spine with the patient prone yielded better thecal sac opacification. Bilateral S1 and right S2 and S3 nerve root sleeves are dilated. Moderate atherosclerotic aortic calcification is noted. Punctate calculi are noted in both kidneys without hydronephrosis.  L1-2: Prior posterior decompression. No disc herniation or stenosis.  L2-3: Prior posterior decompression. Mild circumferential disc bulge without stenosis.  L3-4: Prior posterior decompression. Minimal disc bulging and endplate spurring without stenosis.  L4-5: Prior posterior decompression. Small left foraminal disc protrusion without stenosis.  L5-S1: Prior posterior decompression. Mild disc bulge without stenosis.  IMPRESSION: 1. Moderate cervical spondylosis at C5-6 and C6-7 without cervical spinal stenosis.  Uncovertebral arthrosis results in moderate right neural foraminal stenosis at C5-6 and mild bilateral neural foraminal stenosis at C6-7. 2. Prior posterior fusion from T5 through the sacrum as above. Right T12 screw partially traverses the right lateral recess. 3. Mild lower thoracic spondylosis without thoracic spinal stenosis. Mild right neural foraminal stenosis at T10-11 due to asymmetric facet arthrosis. 4. Decompressed lumbar spinal canal. Mild, diffuse  lumbar spondylosis without stenosis.   Electronically Signed   By: Logan Bores   On: 11/29/2013 17:04    Lumbar DG 2-3 views:  Results for orders placed during the hospital encounter of 09/04/15  DG Lumbar Spine 2-3 Views   Narrative CLINICAL DATA:  Low back pain since Saturday.  EXAM: LUMBAR SPINE - 2-3 VIEW  COMPARISON:  CT abdomen 01/17/2015  FINDINGS: There is no evidence of lumbar spine fracture. Alignment is anatomic. Mild degenerative disc disease at L2-3, L4-5 and L5-S1. Posterior lumbar fusion from T10 through S1 with bilateral ileal screws. No hardware failure or complication.  IMPRESSION: 1.  No acute osseous injury of the lumbar spine.   Electronically Signed   By: Kathreen Devoid   On: 09/04/2015 15:10    Sacroiliac Joint Imaging: Sacroiliac Joint DG:  Results for orders placed during the hospital encounter of 07/16/16  DG Si Joints   Narrative CLINICAL DATA:  Groin pain.  Prior back surgery.  EXAM: BILATERAL SACROILIAC JOINTS - 3+ VIEW  COMPARISON:  Right hip 07/12/2016.  FINDINGS: Prior lumbosacral and bilateral sacroiliac fusion. Degenerative changes lumbar spine, both SI joints, both hips. No acute bony abnormality identified. No evidence of fracture.  IMPRESSION: Postsurgical and degenerative change. No acute or focal abnormality.   Electronically Signed   By: Marcello Moores  Register   On: 07/16/2016 14:24    Hip Imaging: Hip-R DG 2-3 views:  Results for orders placed during the hospital encounter of 07/16/16  DG HIP UNILAT W OR W/O PELVIS 2-3 VIEWS RIGHT   Narrative CLINICAL DATA:  Left hip/ groin pain.  EXAM: DG HIP (WITH OR WITHOUT PELVIS) 2-3V RIGHT  COMPARISON:  CT abdomen and pelvis 02/17/2016  FINDINGS: Sequelae of extensive posterior spinal fusion are again identified extending to the sacrum and with bilateral iliac screws present as well. Right hip joint space width is preserved without significant degenerative changes or  erosion identified. Bone mineralization appears normal. A moderate amount of colonic stool is partially visualized.  IMPRESSION: Unremarkable appearance of the right hip.   Electronically Signed   By: Logan Bores M.D.   On: 07/16/2016 14:25    Hip-L DG 2-3 views:  Results for orders placed during the hospital encounter of 07/16/16  DG HIP UNILAT W OR W/O PELVIS 2-3 VIEWS LEFT   Narrative CLINICAL DATA:  Left hip/ groin pain.  EXAM: DG HIP (WITH OR WITHOUT PELVIS) 2-3V LEFT  COMPARISON:  CT abdomen and pelvis 02/17/2016  FINDINGS: Sequelae of extensive posterior spinal fusion are again identified extending to the sacrum and with bilateral iliac screws present as well. Left hip joint space width is preserved without significant degenerative changes or erosion identified. Bone mineralization appears normal. A moderate amount of colonic stool is partially visualized.  IMPRESSION: Unremarkable appearance of the left hip.   Electronically Signed   By: Logan Bores M.D.   On: 07/16/2016 14:24    Complexity Note: Imaging results reviewed. Results shared with Ms. Guadron, using [employer].  Meds   Current Outpatient Medications:  .  furosemide (LASIX) 40 MG tablet, Take 40 mg by mouth 2 (two) times daily. , Disp: , Rfl:  .  lactulose (CHRONULAC) 10 GM/15ML solution, Take 10 g by mouth 3 (three) times daily as needed for mild constipation or moderate constipation. , Disp: , Rfl:  .  LYRICA 150 MG capsule, Take 150 mg by mouth 2 (two) times daily., Disp: , Rfl: 2 .  magnesium oxide (MAG-OX) 400 MG tablet, Take 400 mg by mouth daily., Disp: , Rfl:  .  metFORMIN (GLUCOPHAGE) 500 MG tablet, Take 500 mg by mouth 3 (three) times daily after meals., Disp: , Rfl:  .  Multiple Vitamins-Minerals (CENTRUM SILVER 50+WOMEN PO), Take 1 tablet by mouth daily., Disp: , Rfl:  .  mupirocin ointment (BACTROBAN) 2 %, Apply to affected area 3 times daily, Disp: 22 g,  Rfl: 0 .  [START ON 08/15/2017] Oxycodone HCl 20 MG TABS, Take 1 tablet (20 mg total) by mouth every 6 (six) hours., Disp: 120 tablet, Rfl: 0 .  [START ON 07/16/2017] Oxycodone HCl 20 MG TABS, Take 1 tablet (20 mg total) by mouth every 6 (six) hours., Disp: 120 tablet, Rfl: 0 .  [START ON 06/16/2017] Oxycodone HCl 20 MG TABS, Take 1 tablet (20 mg total) by mouth every 6 (six) hours as needed., Disp: 120 tablet, Rfl: 0 .  pantoprazole (PROTONIX) 40 MG tablet, Take 40 mg by mouth twice daily., Disp: , Rfl: 11 .  PARoxetine (PAXIL) 40 MG tablet, Take 40 mg by mouth every morning., Disp: , Rfl: 2 .  pregabalin (LYRICA) 100 MG capsule, Take 100 mg by mouth 2 (two) times daily., Disp: , Rfl:  .  spironolactone (ALDACTONE) 50 MG tablet, TAKE 2 TABLETS BY MOUTH EVERY DAY, Disp: , Rfl:  .  thiamine (VITAMIN B-1) 100 MG tablet, Take 1 tablet (100 mg total) by mouth daily., Disp: 90 tablet, Rfl: 6 .  TRADJENTA 5 MG TABS tablet, Take 5 mg by mouth daily., Disp: , Rfl: 2  ROS  Constitutional: Denies any fever or chills Gastrointestinal: No reported hemesis, hematochezia, vomiting, or acute GI distress Musculoskeletal: Denies any acute onset joint swelling, redness, loss of ROM, or weakness Neurological: No reported episodes of acute onset apraxia, aphasia, dysarthria, agnosia, amnesia, paralysis, loss of coordination, or loss of consciousness  Allergies  Ms. Ballester has No Known Allergies.  PFSH  Drug: Ms. Colasanti  reports that she does not use drugs. Alcohol:  reports that she does not drink alcohol. Tobacco:  reports that she has been smoking cigarettes.  She has a 42.00 pack-year smoking history. she has never used smokeless tobacco. Medical:  has a past medical history of Alcohol abuse, Alcoholic cirrhosis of liver without ascites ([place]), Anemia, Anxiety, Arthropathy, Back pain, Bronchitis, BRONCHITIS, ACUTE (11/01/2009), Chronic hepatitis C ([place]), Chronic LBP (12/25/2014), Cirrhosis ([place]), DDD (degenerative  disc disease), lumbar (01/01/2015), Depression, Diabetes mellitus without complication ([place]), Diverticulosis, Edema, Fatigue, Fibromyalgia, GERD (gastroesophageal reflux disease), High risk medications (not anticoagulants) long-term use, Hyperglycemia, Hypertension, Kidney mass, Left leg pain (01/01/2015), Lumbago, Lumbar radicular pain (01/01/2015), Muscle spasm, Neck pain (01/01/2015), Polyarthritis, Reflux, Restless leg syndrome, RLS (restless legs syndrome), Sacroiliac pain (01/01/2015), Shoulder pain, Sinusitis, SOB (shortness of breath), Spine disorder, Stress headaches, Upper back pain (01/01/2015), Urinary frequency, and Vitamin D deficiency disease. Surgical: Ms. Nikolov  has a past surgical history that includes Tubal ligation; Tonsillectomy; Back surgery (12/19/2011); Esophagogastroduodenoscopy (N/A, 04/14/2015); Esophagogastroduodenoscopy (egd) with propofol (N/A, 09/16/2015); Colonoscopy with  propofol (N/A, 09/25/2015); Umbilical hernia repair (N/A, 11/06/2015); Hernia repair (N/A, 10/2015); Esophagogastroduodenoscopy (egd) with propofol (N/A, 01/22/2016); Back surgery; Colonoscopy with propofol (N/A, 04/01/2016); Esophagogastroduodenoscopy (egd) with propofol (N/A, 04/01/2016); Electormagnetic navigation bronchoscopy (N/A, 11/25/2016); and IR Radiologist Eval & Mgmt (04/13/2017). Family: family history includes [name] in her father; Breast cancer (age of onset: 37) in her sister; Heart disease in her mother; Stroke in her mother.  Constitutional Exam  General appearance: Well nourished, well developed, and well hydrated. In no apparent acute distress There were no vitals filed for this visit. BMI Assessment: Estimated body mass index is 26.11 kg/m as calculated from the following:   Height as of 05/04/17: '5\' 10"'$  (1.778 m).   Weight as of 05/04/17: 182 lb (82.6 kg).  BMI interpretation table: BMI level Category Range association with higher incidence of chronic pain  <18 kg/m2 Underweight   18.5-24.9 kg/m2  Ideal body weight   25-29.9 kg/m2 Overweight Increased incidence by 20%  30-34.9 kg/m2 Obese (Class I) Increased incidence by 68%  35-39.9 kg/m2 Severe obesity (Class II) Increased incidence by 136%  >40 kg/m2 Extreme obesity (Class III) Increased incidence by 254%   BMI Readings from Last 4 Encounters:  05/04/17 26.11 kg/m  04/23/17 26.11 kg/m  04/13/17 26.11 kg/m  04/07/17 27.60 kg/m   Wt Readings from Last 4 Encounters:  05/04/17 182 lb (82.6 kg)  04/23/17 182 lb (82.6 kg)  04/13/17 182 lb (82.6 kg)  04/07/17 186 lb 14.4 oz (84.8 kg)  Psych/Mental status: Alert, oriented x 3 (person, place, & time)       Eyes: PERLA Respiratory: No evidence of acute respiratory distress  Cervical Spine Area Exam  Skin & Axial Inspection: No masses, redness, edema, swelling, or associated skin lesions Alignment: Symmetrical Functional ROM: Unrestricted ROM      Stability: No instability detected Muscle Tone/Strength: Functionally intact. No obvious neuro-muscular anomalies detected. Sensory (Neurological): Unimpaired Palpation: No palpable anomalies              Upper Extremity (UE) Exam    Side: Right upper extremity  Side: Left upper extremity  Skin & Extremity Inspection: Skin color, temperature, and hair growth are WNL. No peripheral edema or cyanosis. No masses, redness, swelling, asymmetry, or associated skin lesions. No contractures.  Skin & Extremity Inspection: Skin color, temperature, and hair growth are WNL. No peripheral edema or cyanosis. No masses, redness, swelling, asymmetry, or associated skin lesions. No contractures.  Functional ROM: Unrestricted ROM          Functional ROM: Unrestricted ROM          Muscle Tone/Strength: Functionally intact. No obvious neuro-muscular anomalies detected.  Muscle Tone/Strength: Functionally intact. No obvious neuro-muscular anomalies detected.  Sensory (Neurological): Unimpaired          Sensory (Neurological): Unimpaired           Palpation: No palpable anomalies              Palpation: No palpable anomalies              Specialized Test(s): Deferred         Specialized Test(s): Deferred          Thoracic Spine Area Exam  Skin & Axial Inspection: No masses, redness, or swelling Alignment: Symmetrical Functional ROM: Unrestricted ROM Stability: No instability detected Muscle Tone/Strength: Functionally intact. No obvious neuro-muscular anomalies detected. Sensory (Neurological): Unimpaired Muscle strength & Tone: No palpable anomalies  Lumbar Spine Area Exam  Skin & Axial Inspection:  No masses, redness, or swelling Alignment: Symmetrical Functional ROM: Unrestricted ROM      Stability: No instability detected Muscle Tone/Strength: Functionally intact. No obvious neuro-muscular anomalies detected. Sensory (Neurological): Unimpaired Palpation: No palpable anomalies       Provocative Tests: Lumbar Hyperextension and rotation test: evaluation deferred today       Lumbar Lateral bending test: evaluation deferred today       Patrick's Maneuver: evaluation deferred today                    Gait & Posture Assessment  Ambulation: Unassisted Gait: Relatively normal for age and body habitus Posture: WNL   Lower Extremity Exam    Side: Right lower extremity  Side: Left lower extremity  Skin & Extremity Inspection: Skin color, temperature, and hair growth are WNL. No peripheral edema or cyanosis. No masses, redness, swelling, asymmetry, or associated skin lesions. No contractures.  Skin & Extremity Inspection: Skin color, temperature, and hair growth are WNL. No peripheral edema or cyanosis. No masses, redness, swelling, asymmetry, or associated skin lesions. No contractures.  Functional ROM: Unrestricted ROM          Functional ROM: Unrestricted ROM          Muscle Tone/Strength: Functionally intact. No obvious neuro-muscular anomalies detected.  Muscle Tone/Strength: Functionally intact. No obvious neuro-muscular  anomalies detected.  Sensory (Neurological): Unimpaired  Sensory (Neurological): Unimpaired  Palpation: No palpable anomalies  Palpation: No palpable anomalies   Assessment  Primary Diagnosis & Pertinent Problem List: There were no encounter diagnoses.  Status Diagnosis  Controlled Controlled Controlled No diagnosis found.  Problems updated and reviewed during this visit: Problem  Malignant Neoplasm of Upper Lobe of Right Lung ([facility])  Chronic Hepatitis C Without Hepatic Coma ([facility])  Cirrhosis of Liver With Ascites ([facility])   Plan of Care  Pharmacotherapy (Medications Ordered): No orders of the defined types were placed in this encounter.  Medications administered today: Letta Median B. Denault had no medications administered during this visit.  Procedure Orders    No procedure(s) ordered today   Lab Orders  No laboratory test(s) ordered today   Imaging Orders  No imaging studies ordered today   Referral Orders  No referral(s) requested today    Interventional management options: Planned, scheduled, and/or pending:   ***   Considering:   ***   Palliative PRN treatment(s):   None at this time   Provider-requested follow-up: No Follow-up on file.  Future Appointments  Date Time Provider [place]  05/18/2017 10:30 AM [name], MD [facility] None  07/07/2017  2:30 PM [facility] None  08/29/2017 10:45 AM Vevelyn Francois, NP [facility] None  09/13/2017  9:15 AM Hollice Espy, MD [facility] None  10/03/2017 10:00 AM [facility]  10/13/2017  2:15 PM [facility] None  10/13/2017  2:30 PM Sindy Guadeloupe, MD [facility] None   Primary Care Physician: Jodi Marble, MD Location: [facility] Note by: Gaspar Cola, MD Date: 05/18/2017; Time: 3:37 PM

## 2017-05-18 ENCOUNTER — Ambulatory Visit: Payer: Medicare Other | Admitting: Pain Medicine

## 2017-05-25 ENCOUNTER — Other Ambulatory Visit: Payer: Self-pay | Admitting: Gastroenterology

## 2017-05-25 DIAGNOSIS — R1013 Epigastric pain: Secondary | ICD-10-CM

## 2017-05-25 DIAGNOSIS — D649 Anemia, unspecified: Secondary | ICD-10-CM

## 2017-06-07 NOTE — Progress Notes (Signed)
[name] Name: Audrey Garrett  MRN: [id number]  Referring Provider: Jodi Marble, MD  DOB: 10/25/1960  PCP: Jodi Marble, MD  DOS: 06/08/2017  Note by: Gaspar Cola, MD  Service setting: Ambulatory outpatient  Specialty: Interventional Pain Management  Location: [facility] (AMB) Pain Management Facility    Patient type: Established   Primary Reason(s) for Visit: Evaluation of chronic illnesses with exacerbation, or progression (Level of risk: moderate) CC: Back Pain (lower and mid)  HPI  Audrey Garrett is a 57 y.o. year old, female patient, who comes today for a follow-up evaluation. She has Insomnia, persistent; BP (high blood pressure); Cervical radicular pain; Uncomplicated opioid dependence ([place]); Long term prescription opiate use; Failed back surgical syndrome; Lumbar facet syndrome (Bilateral) (L>R); Osteoarthritis of spine with radiculopathy, lumbar region; Musculoskeletal pain; Chronic low back pain (Primary Source of Pain) (Bilateral) (L>R); HCV (hepatitis C virus); At risk for osteopenia; Lumbar spondylosis; Chronic lumbar radicular pain (Secondary source of pain) (Left); Chronic neck pain; Chronic lower extremity pain (Left); Chronic sacroiliac joint pain (Bilateral) (R>L); Chronic upper back pain; Long term current use of opiate analgesic; Encounter for chronic pain management; Hypomagnesemia (low magnesium levels); Hypotension; Anemia; Thrombocytopenia ([place]); Coagulopathy ([place]); Hyperglycemia; Polyneuropathy; Urinary retention; Difficulty urinating; Nephrolithiasis; Alcoholic cirrhosis of liver with ascites ([place]); Depression, major, recurrent, moderate ([place]); Abdominal pain, chronic, epigastric; Hepatic cirrhosis ([place]); Acute GI bleeding; Neurogenic pain; S/P thoracolumbar fusion (T5-L5); Grade 1 Retrolisthesis (4 mm) of C5 over C6 and C6 over C7; Cervical foraminal stenosis (Left C5-6; Bilateral C6-7); Hernia of anterior abdominal wall; Incarcerated umbilical hernia; Controlled type 2  diabetes mellitus with hyperglycemia ([place]); Acute blood loss anemia; Chronic pain syndrome; Malnutrition of moderate degree; Iron deficiency anemia due to chronic blood loss; Opiate use; RLS (restless legs syndrome); Bilateral leg edema; Depression, major, in remission ([place]); Chronic hip pain (Bilateral); Osteoarthritis of hip (Bilateral); Personal history of tobacco use, presenting hazards to health; Chronic groin pain, left; Malignant neoplasm of upper lobe of right lung ([place]); Acute postoperative pain; Chronic hepatitis C without hepatic coma ([place]); and Cirrhosis of liver with ascites ([place]) on their problem list. Audrey Garrett was last seen on 01/04/2017. Her primarily concern today is the Back Pain (lower and mid)  Pain Assessment: Location: Lower, Mid Back Radiating: Radiates down to buttock around to groin area on left side Onset: More than a month ago Duration: Chronic pain Quality: Dull, Aching, Constant Severity: 4 /10 (self-reported pain score)  Note: Reported level is compatible with observation.                         When using our objective Pain Scale, levels between 6 and 10/10 are said to belong in an emergency room, as it progressively worsens from a 6/10, described as severely limiting, requiring emergency care not usually available at an outpatient pain management facility. At a 6/10 level, communication becomes difficult and requires great effort. Assistance to reach the emergency department may be required. Facial flushing and profuse sweating along with potentially dangerous increases in heart rate and blood pressure will be evident. Timing: Constant Modifying factors: recliner chair, heating pad   The patient is here today to go over the results of her recent imaging.  This was done in great detail and I have provided the patient with clear explanations regarding all the findings.  Further details on both, my assessment(s), as well as the proposed treatment plan, please see  below.  Laboratory Chemistry  Inflammation  Markers (CRP: Acute Phase) (ESR: Chronic Phase) Lab Results  Component Value Date   CRP <0.5 04/03/2015   ESRSEDRATE 27 04/03/2015   LATICACIDVEN 2.7 (HH) 04/23/2017                         Renal Function Markers Lab Results  Component Value Date   BUN 11 04/23/2017   CREATININE 0.72 04/23/2017   GFRAA >60 04/23/2017   GFRNONAA >60 04/23/2017                 Hepatic Function Markers Lab Results  Component Value Date   AST 68 (H) 04/23/2017   ALT 35 04/23/2017   ALBUMIN 4.2 04/23/2017   ALKPHOS 90 04/23/2017   LIPASE 32 11/06/2015   AMMONIA 12 09/14/2016                 Electrolytes Lab Results  Component Value Date   NA 130 (L) 04/23/2017   K 4.0 04/23/2017   CL 93 (L) 04/23/2017   CALCIUM 8.8 (L) 04/23/2017   MG 1.9 01/22/2016   PHOS 3.6 11/23/2013                        Neuropathy Markers Lab Results  Component Value Date   VITAMINB12 557 01/03/2017   FOLATE 13.5 01/03/2017   HGBA1C 5.3 05/22/2015                 Coagulation Parameters Lab Results  Component Value Date   INR 1.29 11/19/2016   LABPROT 16.2 (H) 11/19/2016   APTT 34 01/20/2016   PLT 136 (L) 04/23/2017                 Cardiovascular Markers Lab Results  Component Value Date   BNP 929 (H) 09/23/2013   CKTOTAL 1,597 (H) 12/17/2012   TROPONINI <0.03 05/07/2015   HGB 12.2 04/23/2017   HCT 35.2 04/23/2017                 Note: Lab results reviewed.  Recent Diagnostic Imaging Review  Cervical Imaging: Cervical CT w contrast:  Results for orders placed during the hospital encounter of 11/29/13  CT Cervical Spine W Contrast   Narrative CLINICAL DATA:  Neck and back pain.  Bilateral lower extremity pain.  FLUOROSCOPY TIME:  1 min 45 seconds  PROCEDURE: LUMBAR PUNCTURE FOR CERVICAL LUMBAR AND THORACIC MYELOGRAM  CERVICAL AND LUMBAR AND THORACIC MYELOGRAM  CT CERVICAL MYELOGRAM  CT LUMBAR MYELOGRAM  CT THORACIC MYELOGRAM  After  thorough discussion of risks and benefits of the procedure including bleeding, infection, injury to nerves, blood vessels, adjacent structures as well as headache and CSF leak, written and oral informed consent was obtained. Consent was obtained by Dr. Logan Bores.  Patient was positioned prone on the fluoroscopy table. Local anesthesia was provided with 1% lidocaine without epinephrine after prepped and draped in the usual sterile fashion. Puncture was performed at L3-4 using a 3 1/2 inch 22-gauge spinal needle via a midline approach. Using a single pass through the dura, the needle was placed within the thecal sac, with return of clear CSF. 10 mL Omnipaque-300 was injected into the thecal sac, with normal opacification of the nerve roots and cauda equina consistent with free flow within the subarachnoid space. The patient was then moved to the trendelenburg position and contrast flowed into the Thoracic and Cervical spine regions.  I personally performed the lumbar puncture and administered  the intrathecal contrast. I also personally supervised acquisition of the myelogram images.  TECHNIQUE: Contiguous axial images were obtained through the Cervical, Thoracic, and Lumbar spine after the intrathecal infusion of contrast. Coronal and sagittal reconstructions were obtained of the axial image sets.  FINDINGS: CERVICAL, THORACIC AND LUMBAR MYELOGRAM FINDINGS:  There is slight retrolisthesis of C5 on C6. Moderate disc space narrowing is present at C5-6 and C6-7 with moderate end plate osteophyte formation, resulting in moderate ventral epidural defects at each level. Thoracic vertebral alignment is normal. Vertebral body heights are preserved without evidence of compression fracture. Extensive posterior spinal fusion has been performed from T5 through the lumbar spine and sacrum with fixation screws in the ilia as well. No thoracic spinal stenosis is identified. There is  mild straightening of the normal lumbar lordosis without listhesis on upright neutral, flexion, and extension imaging. Mild disc space narrowing is present at L2-3 and L3-4. Dilated nerve root sleeves are noted bilaterally at S1.  CT CERVICAL MYELOGRAM FINDINGS:  There is straightening of the normal cervical lordosis. There is grade 1 retrolisthesis of C5 on C6 and C6 on C7, measuring approximately 4 mm each. Moderate disc space narrowing and endplate osteophyte formation are present at these 2 levels. The cervical spinal canal is capacious on a developmental basis. The cervical spinal cord is normal in caliber. Mild scarring is noted in the lung apices.  C2-3 through C4-5:  Negative.  C5-6: Broad-based posterior disc osteophyte complex mildly effaces the ventral thecal sac without spinal stenosis. Asymmetric right-sided uncovertebral arthrosis results in moderate right neural foraminal stenosis.  C6-7: Broad-based posterior disc osteophyte complex mildly effaces the ventral thecal sac without spinal canal stenosis. Spinal canal maintains an AP diameter of 10 mm. Uncovertebral arthrosis results in mild bilateral neural foraminal stenosis.  C7-T1: No disc herniation or stenosis. Small left-sided cervical rib which is fused with the proximal aspect of the first thoracic rib.  CT THORACIC MYELOGRAM FINDINGS:  Prior thoracolumbar fusion has been performed. Bilateral pedicle screws are present at T5, T6, T8, T10, and T12. The right T5 screw courses lateral to the pedicle and terminates lateral to the vertebral body. The left T6 screw traverses the lateral portion of the pedicle, terminating slightly lateral to the vertebral body. The right T12 screw partially traverses the right lateral recess. There is no evidence of screw loosening. Bilateral posterior fusion masses are present.  The thoracic spinal canal is overall capacious. There is a small central disc protrusion at T7-8  which may contact the spinal cord without spinal stenosis or mass effect on the cord. Similar, small central disc protrusions are also seen T8-9 and T9-10. There is mild circumferential disc bulging at T10-11 without spinal stenosis. There is also mild disc bulging at T11-12, without evidence of spinal stenosis although evaluation is partially limited at this level due to dependent layering of contrast in the dorsal canal. There is mild right neural foraminal stenosis at T10-11 due to prominent facet arthrosis.  There are small bilateral pleural effusions. Dependent subsegmental atelectasis is present in both lower lobes.  CT LUMBAR MYELOGRAM FINDINGS:  There is mild straightening of the normal lumbar lordosis. There is no listhesis. Vertebral body heights are preserved. Mild disc space narrowing is present at L2-3 and L3-4. Thoracolumbar fusion hardware is again seen, with bilateral pedicle screws from L1-L5 as well as at S1. Screws also extend into the ilia. There is no evidence of screw loosening. 12 mm, well-circumscribed lucency in the L4 vertebral body  may represent a hemangioma. Contrast was primarily layering dependently in the lower lumbar spine and sacral canal during initial supine imaging of the entire spine. Repeat imaging of the lumbar spine with the patient prone yielded better thecal sac opacification. Bilateral S1 and right S2 and S3 nerve root sleeves are dilated. Moderate atherosclerotic aortic calcification is noted. Punctate calculi are noted in both kidneys without hydronephrosis.  L1-2: Prior posterior decompression. No disc herniation or stenosis.  L2-3: Prior posterior decompression. Mild circumferential disc bulge without stenosis.  L3-4: Prior posterior decompression. Minimal disc bulging and endplate spurring without stenosis.  L4-5: Prior posterior decompression. Small left foraminal disc protrusion without stenosis.  L5-S1: Prior posterior  decompression. Mild disc bulge without stenosis.  IMPRESSION: 1. Moderate cervical spondylosis at C5-6 and C6-7 without cervical spinal stenosis. Uncovertebral arthrosis results in moderate right neural foraminal stenosis at C5-6 and mild bilateral neural foraminal stenosis at C6-7. 2. Prior posterior fusion from T5 through the sacrum as above. Right T12 screw partially traverses the right lateral recess. 3. Mild lower thoracic spondylosis without thoracic spinal stenosis. Mild right neural foraminal stenosis at T10-11 due to asymmetric facet arthrosis. 4. Decompressed lumbar spinal canal. Mild, diffuse lumbar spondylosis without stenosis.   Electronically Signed   By: Logan Bores   On: 11/29/2013 17:04    Thoracic Imaging: Thoracic CT w contrast:  Results for orders placed during the hospital encounter of 11/29/13  CT Thoracic Spine W Contrast   Narrative CLINICAL DATA:  Neck and back pain.  Bilateral lower extremity pain.  FLUOROSCOPY TIME:  1 min 45 seconds  PROCEDURE: LUMBAR PUNCTURE FOR CERVICAL LUMBAR AND THORACIC MYELOGRAM  CERVICAL AND LUMBAR AND THORACIC MYELOGRAM  CT CERVICAL MYELOGRAM  CT LUMBAR MYELOGRAM  CT THORACIC MYELOGRAM  After thorough discussion of risks and benefits of the procedure including bleeding, infection, injury to nerves, blood vessels, adjacent structures as well as headache and CSF leak, written and oral informed consent was obtained. Consent was obtained by Dr. Logan Bores.  Patient was positioned prone on the fluoroscopy table. Local anesthesia was provided with 1% lidocaine without epinephrine after prepped and draped in the usual sterile fashion. Puncture was performed at L3-4 using a 3 1/2 inch 22-gauge spinal needle via a midline approach. Using a single pass through the dura, the needle was placed within the thecal sac, with return of clear CSF. 10 mL Omnipaque-300 was injected into the thecal sac, with normal opacification  of the nerve roots and cauda equina consistent with free flow within the subarachnoid space. The patient was then moved to the trendelenburg position and contrast flowed into the Thoracic and Cervical spine regions.  I personally performed the lumbar puncture and administered the intrathecal contrast. I also personally supervised acquisition of the myelogram images.  TECHNIQUE: Contiguous axial images were obtained through the Cervical, Thoracic, and Lumbar spine after the intrathecal infusion of contrast. Coronal and sagittal reconstructions were obtained of the axial image sets.  FINDINGS: CERVICAL, THORACIC AND LUMBAR MYELOGRAM FINDINGS:  There is slight retrolisthesis of C5 on C6. Moderate disc space narrowing is present at C5-6 and C6-7 with moderate end plate osteophyte formation, resulting in moderate ventral epidural defects at each level. Thoracic vertebral alignment is normal. Vertebral body heights are preserved without evidence of compression fracture. Extensive posterior spinal fusion has been performed from T5 through the lumbar spine and sacrum with fixation screws in the ilia as well. No thoracic spinal stenosis is identified. There is mild straightening of the normal  lumbar lordosis without listhesis on upright neutral, flexion, and extension imaging. Mild disc space narrowing is present at L2-3 and L3-4. Dilated nerve root sleeves are noted bilaterally at S1.  CT CERVICAL MYELOGRAM FINDINGS:  There is straightening of the normal cervical lordosis. There is grade 1 retrolisthesis of C5 on C6 and C6 on C7, measuring approximately 4 mm each. Moderate disc space narrowing and endplate osteophyte formation are present at these 2 levels. The cervical spinal canal is capacious on a developmental basis. The cervical spinal cord is normal in caliber. Mild scarring is noted in the lung apices.  C2-3 through C4-5:  Negative.  C5-6: Broad-based posterior disc  osteophyte complex mildly effaces the ventral thecal sac without spinal stenosis. Asymmetric right-sided uncovertebral arthrosis results in moderate right neural foraminal stenosis.  C6-7: Broad-based posterior disc osteophyte complex mildly effaces the ventral thecal sac without spinal canal stenosis. Spinal canal maintains an AP diameter of 10 mm. Uncovertebral arthrosis results in mild bilateral neural foraminal stenosis.  C7-T1: No disc herniation or stenosis. Small left-sided cervical rib which is fused with the proximal aspect of the first thoracic rib.  CT THORACIC MYELOGRAM FINDINGS:  Prior thoracolumbar fusion has been performed. Bilateral pedicle screws are present at T5, T6, T8, T10, and T12. The right T5 screw courses lateral to the pedicle and terminates lateral to the vertebral body. The left T6 screw traverses the lateral portion of the pedicle, terminating slightly lateral to the vertebral body. The right T12 screw partially traverses the right lateral recess. There is no evidence of screw loosening. Bilateral posterior fusion masses are present.  The thoracic spinal canal is overall capacious. There is a small central disc protrusion at T7-8 which may contact the spinal cord without spinal stenosis or mass effect on the cord. Similar, small central disc protrusions are also seen T8-9 and T9-10. There is mild circumferential disc bulging at T10-11 without spinal stenosis. There is also mild disc bulging at T11-12, without evidence of spinal stenosis although evaluation is partially limited at this level due to dependent layering of contrast in the dorsal canal. There is mild right neural foraminal stenosis at T10-11 due to prominent facet arthrosis.  There are small bilateral pleural effusions. Dependent subsegmental atelectasis is present in both lower lobes.  CT LUMBAR MYELOGRAM FINDINGS:  There is mild straightening of the normal lumbar lordosis. There  is no listhesis. Vertebral body heights are preserved. Mild disc space narrowing is present at L2-3 and L3-4. Thoracolumbar fusion hardware is again seen, with bilateral pedicle screws from L1-L5 as well as at S1. Screws also extend into the ilia. There is no evidence of screw loosening. 12 mm, well-circumscribed lucency in the L4 vertebral body may represent a hemangioma. Contrast was primarily layering dependently in the lower lumbar spine and sacral canal during initial supine imaging of the entire spine. Repeat imaging of the lumbar spine with the patient prone yielded better thecal sac opacification. Bilateral S1 and right S2 and S3 nerve root sleeves are dilated. Moderate atherosclerotic aortic calcification is noted. Punctate calculi are noted in both kidneys without hydronephrosis.  L1-2: Prior posterior decompression. No disc herniation or stenosis.  L2-3: Prior posterior decompression. Mild circumferential disc bulge without stenosis.  L3-4: Prior posterior decompression. Minimal disc bulging and endplate spurring without stenosis.  L4-5: Prior posterior decompression. Small left foraminal disc protrusion without stenosis.  L5-S1: Prior posterior decompression. Mild disc bulge without stenosis.  IMPRESSION: 1. Moderate cervical spondylosis at C5-6 and C6-7 without cervical spinal  stenosis. Uncovertebral arthrosis results in moderate right neural foraminal stenosis at C5-6 and mild bilateral neural foraminal stenosis at C6-7. 2. Prior posterior fusion from T5 through the sacrum as above. Right T12 screw partially traverses the right lateral recess. 3. Mild lower thoracic spondylosis without thoracic spinal stenosis. Mild right neural foraminal stenosis at T10-11 due to asymmetric facet arthrosis. 4. Decompressed lumbar spinal canal. Mild, diffuse lumbar spondylosis without stenosis.   Electronically Signed   By: Logan Bores   On: 11/29/2013 17:04    Thoracic  DG 2-3 views:  Results for orders placed in visit on 01/06/15  DG Thoracic Spine 2 View   Lumbosacral Imaging: Lumbar CT wo contrast:  Results for orders placed during the hospital encounter of 09/22/15  CT LUMBAR SPINE WO CONTRAST   Narrative CLINICAL DATA:  Acute exacerbation of chronic back pain.  EXAM: CT LUMBAR SPINE WITHOUT CONTRAST  TECHNIQUE: Multidetector CT imaging of the lumbar spine was performed without intravenous contrast administration. Multiplanar CT image reconstructions were also generated.  COMPARISON:  Lumbar radiographs from 6 10/2015  FINDINGS: There has been previous posterior fusion using pedicle screws and rods from the lower thoracic region to the sacrum and pelvis. Within limits for detection on noncontrast CT lumbar, I do not see an acute fracture, intraspinal hematoma, or lumbar compressive lesion. No gross hardware malposition. Solid posterior arthrodesis through least the L1 through S1 segments.  Aortic atherosclerosis. Renal cortical calcifications, incompletely evaluated. Suspected gallstones.  IMPRESSION: No acute findings status post thoracosacral fusion.   Electronically Signed   By: Staci Righter M.D.   On: 09/25/2015 18:42    Lumbar CT w contrast:  Results for orders placed during the hospital encounter of 11/29/13  CT Lumbar Spine W Contrast   Narrative CLINICAL DATA:  Neck and back pain.  Bilateral lower extremity pain.  FLUOROSCOPY TIME:  1 min 45 seconds  PROCEDURE: LUMBAR PUNCTURE FOR CERVICAL LUMBAR AND THORACIC MYELOGRAM  CERVICAL AND LUMBAR AND THORACIC MYELOGRAM  CT CERVICAL MYELOGRAM  CT LUMBAR MYELOGRAM  CT THORACIC MYELOGRAM  After thorough discussion of risks and benefits of the procedure including bleeding, infection, injury to nerves, blood vessels, adjacent structures as well as headache and CSF leak, written and oral informed consent was obtained. Consent was obtained by Dr. Logan Bores.  Patient  was positioned prone on the fluoroscopy table. Local anesthesia was provided with 1% lidocaine without epinephrine after prepped and draped in the usual sterile fashion. Puncture was performed at L3-4 using a 3 1/2 inch 22-gauge spinal needle via a midline approach. Using a single pass through the dura, the needle was placed within the thecal sac, with return of clear CSF. 10 mL Omnipaque-300 was injected into the thecal sac, with normal opacification of the nerve roots and cauda equina consistent with free flow within the subarachnoid space. The patient was then moved to the trendelenburg position and contrast flowed into the Thoracic and Cervical spine regions.  I personally performed the lumbar puncture and administered the intrathecal contrast. I also personally supervised acquisition of the myelogram images.  TECHNIQUE: Contiguous axial images were obtained through the Cervical, Thoracic, and Lumbar spine after the intrathecal infusion of contrast. Coronal and sagittal reconstructions were obtained of the axial image sets.  FINDINGS: CERVICAL, THORACIC AND LUMBAR MYELOGRAM FINDINGS:  There is slight retrolisthesis of C5 on C6. Moderate disc space narrowing is present at C5-6 and C6-7 with moderate end plate osteophyte formation, resulting in moderate ventral epidural defects at each level.  Thoracic vertebral alignment is normal. Vertebral body heights are preserved without evidence of compression fracture. Extensive posterior spinal fusion has been performed from T5 through the lumbar spine and sacrum with fixation screws in the ilia as well. No thoracic spinal stenosis is identified. There is mild straightening of the normal lumbar lordosis without listhesis on upright neutral, flexion, and extension imaging. Mild disc space narrowing is present at L2-3 and L3-4. Dilated nerve root sleeves are noted bilaterally at S1.  CT CERVICAL MYELOGRAM FINDINGS:  There is  straightening of the normal cervical lordosis. There is grade 1 retrolisthesis of C5 on C6 and C6 on C7, measuring approximately 4 mm each. Moderate disc space narrowing and endplate osteophyte formation are present at these 2 levels. The cervical spinal canal is capacious on a developmental basis. The cervical spinal cord is normal in caliber. Mild scarring is noted in the lung apices.  C2-3 through C4-5:  Negative.  C5-6: Broad-based posterior disc osteophyte complex mildly effaces the ventral thecal sac without spinal stenosis. Asymmetric right-sided uncovertebral arthrosis results in moderate right neural foraminal stenosis.  C6-7: Broad-based posterior disc osteophyte complex mildly effaces the ventral thecal sac without spinal canal stenosis. Spinal canal maintains an AP diameter of 10 mm. Uncovertebral arthrosis results in mild bilateral neural foraminal stenosis.  C7-T1: No disc herniation or stenosis. Small left-sided cervical rib which is fused with the proximal aspect of the first thoracic rib.  CT THORACIC MYELOGRAM FINDINGS:  Prior thoracolumbar fusion has been performed. Bilateral pedicle screws are present at T5, T6, T8, T10, and T12. The right T5 screw courses lateral to the pedicle and terminates lateral to the vertebral body. The left T6 screw traverses the lateral portion of the pedicle, terminating slightly lateral to the vertebral body. The right T12 screw partially traverses the right lateral recess. There is no evidence of screw loosening. Bilateral posterior fusion masses are present.  The thoracic spinal canal is overall capacious. There is a small central disc protrusion at T7-8 which may contact the spinal cord without spinal stenosis or mass effect on the cord. Similar, small central disc protrusions are also seen T8-9 and T9-10. There is mild circumferential disc bulging at T10-11 without spinal stenosis. There is also mild disc bulging at T11-12,  without evidence of spinal stenosis although evaluation is partially limited at this level due to dependent layering of contrast in the dorsal canal. There is mild right neural foraminal stenosis at T10-11 due to prominent facet arthrosis.  There are small bilateral pleural effusions. Dependent subsegmental atelectasis is present in both lower lobes.  CT LUMBAR MYELOGRAM FINDINGS:  There is mild straightening of the normal lumbar lordosis. There is no listhesis. Vertebral body heights are preserved. Mild disc space narrowing is present at L2-3 and L3-4. Thoracolumbar fusion hardware is again seen, with bilateral pedicle screws from L1-L5 as well as at S1. Screws also extend into the ilia. There is no evidence of screw loosening. 12 mm, well-circumscribed lucency in the L4 vertebral body may represent a hemangioma. Contrast was primarily layering dependently in the lower lumbar spine and sacral canal during initial supine imaging of the entire spine. Repeat imaging of the lumbar spine with the patient prone yielded better thecal sac opacification. Bilateral S1 and right S2 and S3 nerve root sleeves are dilated. Moderate atherosclerotic aortic calcification is noted. Punctate calculi are noted in both kidneys without hydronephrosis.  L1-2: Prior posterior decompression. No disc herniation or stenosis.  L2-3: Prior posterior decompression. Mild circumferential disc  bulge without stenosis.  L3-4: Prior posterior decompression. Minimal disc bulging and endplate spurring without stenosis.  L4-5: Prior posterior decompression. Small left foraminal disc protrusion without stenosis.  L5-S1: Prior posterior decompression. Mild disc bulge without stenosis.  IMPRESSION: 1. Moderate cervical spondylosis at C5-6 and C6-7 without cervical spinal stenosis. Uncovertebral arthrosis results in moderate right neural foraminal stenosis at C5-6 and mild bilateral neural foraminal stenosis at  C6-7. 2. Prior posterior fusion from T5 through the sacrum as above. Right T12 screw partially traverses the right lateral recess. 3. Mild lower thoracic spondylosis without thoracic spinal stenosis. Mild right neural foraminal stenosis at T10-11 due to asymmetric facet arthrosis. 4. Decompressed lumbar spinal canal. Mild, diffuse lumbar spondylosis without stenosis.   Electronically Signed   By: Logan Bores   On: 11/29/2013 17:04    Lumbar DG 2-3 views:  Results for orders placed during the hospital encounter of 09/04/15  DG Lumbar Spine 2-3 Views   Narrative CLINICAL DATA:  Low back pain since Saturday.  EXAM: LUMBAR SPINE - 2-3 VIEW  COMPARISON:  CT abdomen 01/17/2015  FINDINGS: There is no evidence of lumbar spine fracture. Alignment is anatomic. Mild degenerative disc disease at L2-3, L4-5 and L5-S1. Posterior lumbar fusion from T10 through S1 with bilateral ileal screws. No hardware failure or complication.  IMPRESSION: 1.  No acute osseous injury of the lumbar spine.   Electronically Signed   By: Kathreen Devoid   On: 09/04/2015 15:10    Sacroiliac Joint Imaging: Sacroiliac Joint DG:  Results for orders placed during the hospital encounter of 07/16/16  DG Si Joints   Narrative CLINICAL DATA:  Groin pain.  Prior back surgery.  EXAM: BILATERAL SACROILIAC JOINTS - 3+ VIEW  COMPARISON:  Right hip 07/12/2016.  FINDINGS: Prior lumbosacral and bilateral sacroiliac fusion. Degenerative changes lumbar spine, both SI joints, both hips. No acute bony abnormality identified. No evidence of fracture.  IMPRESSION: Postsurgical and degenerative change. No acute or focal abnormality.   Electronically Signed   By: Marcello Moores  Register   On: 07/16/2016 14:24    Hip Imaging: Hip-R DG 2-3 views:  Results for orders placed during the hospital encounter of 07/16/16  DG HIP UNILAT W OR W/O PELVIS 2-3 VIEWS RIGHT   Narrative CLINICAL DATA:  Left hip/ groin  pain.  EXAM: DG HIP (WITH OR WITHOUT PELVIS) 2-3V RIGHT  COMPARISON:  CT abdomen and pelvis 02/17/2016  FINDINGS: Sequelae of extensive posterior spinal fusion are again identified extending to the sacrum and with bilateral iliac screws present as well. Right hip joint space width is preserved without significant degenerative changes or erosion identified. Bone mineralization appears normal. A moderate amount of colonic stool is partially visualized.  IMPRESSION: Unremarkable appearance of the right hip.   Electronically Signed   By: Logan Bores M.D.   On: 07/16/2016 14:25    Hip-L DG 2-3 views:  Results for orders placed during the hospital encounter of 07/16/16  DG HIP UNILAT W OR W/O PELVIS 2-3 VIEWS LEFT   Narrative CLINICAL DATA:  Left hip/ groin pain.  EXAM: DG HIP (WITH OR WITHOUT PELVIS) 2-3V LEFT  COMPARISON:  CT abdomen and pelvis 02/17/2016  FINDINGS: Sequelae of extensive posterior spinal fusion are again identified extending to the sacrum and with bilateral iliac screws present as well. Left hip joint space width is preserved without significant degenerative changes or erosion identified. Bone mineralization appears normal. A moderate amount of colonic stool is partially visualized.  IMPRESSION: Unremarkable appearance of  the left hip.   Electronically Signed   By: Logan Bores M.D.   On: 07/16/2016 14:24    Complexity Note: Imaging results reviewed. Results shared with Audrey Garrett, using [employer]. Today I personally and independently reviewed the study images pertinent to Audrey Garrett's problem. Copy of results provided to patient. Images reviewed using the PACS system hyperlink. I have personally examined the images and I agree with the reported  findings. I find no additional pain-related pathology to add to the report.  Meds   Current Outpatient Medications:  .  furosemide (LASIX) 40 MG tablet, Take 40 mg by mouth 2 (two) times daily. ,  Disp: , Rfl:  .  lactulose (CHRONULAC) 10 GM/15ML solution, Take 10 g by mouth 3 (three) times daily as needed for mild constipation or moderate constipation. , Disp: , Rfl:  .  LYRICA 150 MG capsule, Take 150 mg by mouth 2 (two) times daily., Disp: , Rfl: 2 .  magnesium oxide (MAG-OX) 400 MG tablet, Take 400 mg by mouth daily., Disp: , Rfl:  .  metFORMIN (GLUCOPHAGE) 500 MG tablet, Take 500 mg by mouth 3 (three) times daily after meals., Disp: , Rfl:  .  Multiple Vitamins-Minerals (CENTRUM SILVER 50+WOMEN PO), Take 1 tablet by mouth daily., Disp: , Rfl:  .  [START ON 08/15/2017] Oxycodone HCl 20 MG TABS, Take 1 tablet (20 mg total) by mouth every 6 (six) hours., Disp: 120 tablet, Rfl: 0 .  [START ON 07/16/2017] Oxycodone HCl 20 MG TABS, Take 1 tablet (20 mg total) by mouth every 6 (six) hours., Disp: 120 tablet, Rfl: 0 .  Oxycodone HCl 20 MG TABS, Take 1 tablet (20 mg total) by mouth every 6 (six) hours as needed., Disp: 120 tablet, Rfl: 0 .  pantoprazole (PROTONIX) 40 MG tablet, Take 40 mg by mouth twice daily., Disp: , Rfl: 11 .  PARoxetine (PAXIL) 40 MG tablet, Take 40 mg by mouth every morning., Disp: , Rfl: 2 .  spironolactone (ALDACTONE) 50 MG tablet, TAKE 2 TABLETS BY MOUTH EVERY DAY, Disp: , Rfl:  .  thiamine (VITAMIN B-1) 100 MG tablet, Take 1 tablet (100 mg total) by mouth daily., Disp: 90 tablet, Rfl: 6 .  TRADJENTA 5 MG TABS tablet, Take 5 mg by mouth daily., Disp: , Rfl: 2  ROS  Constitutional: Denies any fever or chills Gastrointestinal: No reported hemesis, hematochezia, vomiting, or acute GI distress Musculoskeletal: Denies any acute onset joint swelling, redness, loss of ROM, or weakness Neurological: No reported episodes of acute onset apraxia, aphasia, dysarthria, agnosia, amnesia, paralysis, loss of coordination, or loss of consciousness  Allergies  Audrey Garrett has No Known Allergies.  PFSH  Drug: Audrey Garrett  reports that she does not use drugs. Alcohol:  reports that she  does not drink alcohol. Tobacco:  reports that she has been smoking cigarettes.  She has a 42.00 pack-year smoking history. She has never used smokeless tobacco. Medical:  has a past medical history of Alcohol abuse, Alcoholic cirrhosis of liver without ascites ([place]), Anemia, Anxiety, Arthropathy, Back pain, Bronchitis, BRONCHITIS, ACUTE (11/01/2009), Chronic hepatitis C ([place]), Chronic LBP (12/25/2014), Cirrhosis ([place]), DDD (degenerative disc disease), lumbar (01/01/2015), Depression, Diabetes mellitus without complication ([place]), Diverticulosis, Edema, Fatigue, Fibromyalgia, GERD (gastroesophageal reflux disease), High risk medications (not anticoagulants) long-term use, Hyperglycemia, Hypertension, Kidney mass, Left leg pain (01/01/2015), Lumbago, Lumbar radicular pain (01/01/2015), Muscle spasm, Neck pain (01/01/2015), Polyarthritis, Reflux, Restless leg syndrome, RLS (restless legs syndrome), Sacroiliac pain (01/01/2015), Shoulder pain, Sinusitis, SOB (  shortness of breath), Spine disorder, Stress headaches, Upper back pain (01/01/2015), Urinary frequency, and Vitamin D deficiency disease. Surgical: Ms. Debo  has a past surgical history that includes Tubal ligation; Tonsillectomy; Back surgery (12/19/2011); Esophagogastroduodenoscopy (N/A, 04/14/2015); Esophagogastroduodenoscopy (egd) with propofol (N/A, 09/16/2015); Colonoscopy with propofol (N/A, 09/25/2015); Umbilical hernia repair (N/A, 11/06/2015); Hernia repair (N/A, 10/2015); Esophagogastroduodenoscopy (egd) with propofol (N/A, 01/22/2016); Back surgery; Colonoscopy with propofol (N/A, 04/01/2016); Esophagogastroduodenoscopy (egd) with propofol (N/A, 04/01/2016); Electormagnetic navigation bronchoscopy (N/A, 11/25/2016); and IR Radiologist Eval & Mgmt (04/13/2017). Family: family history includes [name] in her father; Breast cancer (age of onset: 35) in her sister; Heart disease in her mother; Stroke in her mother.  Constitutional Exam  General appearance: Well  nourished, well developed, and well hydrated. In no apparent acute distress Vitals:   06/08/17 1109  BP: 113/65  Pulse: 91  Temp: 98.1 F (36.7 C)  SpO2: 100%  Weight: 180 lb (81.6 kg)  Height: 5' 10" (1.778 m)   BMI Assessment: Estimated body mass index is 25.83 kg/m as calculated from the following:   Height as of this encounter: 5' 10" (1.778 m).   Weight as of this encounter: 180 lb (81.6 kg).  BMI interpretation table: BMI level Category Range association with higher incidence of chronic pain  <18 kg/m2 Underweight   18.5-24.9 kg/m2 Ideal body weight   25-29.9 kg/m2 Overweight Increased incidence by 20%  30-34.9 kg/m2 Obese (Class I) Increased incidence by 68%  35-39.9 kg/m2 Severe obesity (Class II) Increased incidence by 136%  >40 kg/m2 Extreme obesity (Class III) Increased incidence by 254%   BMI Readings from Last 4 Encounters:  06/08/17 25.83 kg/m  05/04/17 26.11 kg/m  04/23/17 26.11 kg/m  04/13/17 26.11 kg/m   Wt Readings from Last 4 Encounters:  06/08/17 180 lb (81.6 kg)  05/04/17 182 lb (82.6 kg)  04/23/17 182 lb (82.6 kg)  04/13/17 182 lb (82.6 kg)  Psych/Mental status: Alert, oriented x 3 (person, place, & time)       Eyes: PERLA Respiratory: No evidence of acute respiratory distress  Cervical Spine Area Exam  Skin & Axial Inspection: No masses, redness, edema, swelling, or associated skin lesions Alignment: Symmetrical Functional ROM: Unrestricted ROM      Stability: No instability detected Muscle Tone/Strength: Functionally intact. No obvious neuro-muscular anomalies detected. Sensory (Neurological): Unimpaired Palpation: No palpable anomalies              Upper Extremity (UE) Exam    Side: Right upper extremity  Side: Left upper extremity  Skin & Extremity Inspection: Skin color, temperature, and hair growth are WNL. No peripheral edema or cyanosis. No masses, redness, swelling, asymmetry, or associated skin lesions. No contractures.  Skin &  Extremity Inspection: Skin color, temperature, and hair growth are WNL. No peripheral edema or cyanosis. No masses, redness, swelling, asymmetry, or associated skin lesions. No contractures.  Functional ROM: Unrestricted ROM          Functional ROM: Unrestricted ROM          Muscle Tone/Strength: Functionally intact. No obvious neuro-muscular anomalies detected.  Muscle Tone/Strength: Functionally intact. No obvious neuro-muscular anomalies detected.  Sensory (Neurological): Unimpaired          Sensory (Neurological): Unimpaired          Palpation: No palpable anomalies              Palpation: No palpable anomalies              Specialized Test(s): Deferred  Specialized Test(s): Deferred          Thoracic Spine Area Exam  Skin & Axial Inspection: No masses, redness, or swelling Alignment: Symmetrical Functional ROM: Unrestricted ROM Stability: No instability detected Muscle Tone/Strength: Functionally intact. No obvious neuro-muscular anomalies detected. Sensory (Neurological): Unimpaired Muscle strength & Tone: No palpable anomalies  Lumbar Spine Area Exam  Skin & Axial Inspection: No masses, redness, or swelling Alignment: Symmetrical Functional ROM: Unrestricted ROM      Stability: No instability detected Muscle Tone/Strength: Functionally intact. No obvious neuro-muscular anomalies detected. Sensory (Neurological): Unimpaired Palpation: No palpable anomalies       Provocative Tests: Lumbar Hyperextension and rotation test: evaluation deferred today       Lumbar Lateral bending test: evaluation deferred today       Patrick's Maneuver: evaluation deferred today                    Gait & Posture Assessment  Ambulation: Unassisted Gait: Relatively normal for age and body habitus Posture: WNL   Lower Extremity Exam    Side: Right lower extremity  Side: Left lower extremity  Skin & Extremity Inspection: Skin color, temperature, and hair growth are WNL. No peripheral edema or  cyanosis. No masses, redness, swelling, asymmetry, or associated skin lesions. No contractures.  Skin & Extremity Inspection: Skin color, temperature, and hair growth are WNL. No peripheral edema or cyanosis. No masses, redness, swelling, asymmetry, or associated skin lesions. No contractures.  Functional ROM: Unrestricted ROM          Functional ROM: Unrestricted ROM          Muscle Tone/Strength: Functionally intact. No obvious neuro-muscular anomalies detected.  Muscle Tone/Strength: Functionally intact. No obvious neuro-muscular anomalies detected.  Sensory (Neurological): Unimpaired  Sensory (Neurological): Unimpaired  Palpation: No palpable anomalies  Palpation: No palpable anomalies   Assessment  Primary Diagnosis & Pertinent Problem List: The primary encounter diagnosis was Chronic low back pain (Primary Source of Pain) (Bilateral) (L>R). Diagnoses of Chronic lumbar radicular pain (Secondary source of pain) (Left) and Failed back surgical syndrome were also pertinent to this visit.  Status Diagnosis  Controlled Controlled Controlled 1. Chronic low back pain (Primary Source of Pain) (Bilateral) (L>R)   2. Chronic lumbar radicular pain (Secondary source of pain) (Left)   3. Failed back surgical syndrome     Problems updated and reviewed during this visit: No problems updated. Plan of Care  Pharmacotherapy (Medications Ordered): No orders of the defined types were placed in this encounter.  Medications administered today: Audrey Garrett had no medications administered during this visit.  Procedure Orders    No procedure(s) ordered today   Lab Orders  No laboratory test(s) ordered today   Imaging Orders  No imaging studies ordered today    Referral Orders     Ambulatory referral to Orthopedic Surgery  Interventional management options: Planned, scheduled, and/or pending:   None at this time. Referral to Dr. Marykay Lex at [facility]. He did patient's prior surgery.  Please  provide the patient with printed copies of her recent x-rays.   Considering:   Diagnostic bilateral lumbar facetblock  Possible bilateral lumbar facet RFA Diagnostic left caudal Epiduralsteroid injection + diagnostic epidurogram Possible Racz procedure Diagnostic bilateral sacroiliacjoint injection  Possible bilateral sacroiliac joint RFA Diagnostic left cervical epiduralsteroid injection  Diagnostic bilateral cervical facetblock  Possible bilateral cervical facet RFA   Palliative PRN treatment(s):   Diagnostic bilateral lumbar facet block Diagnostic bilateral intra-articular hipjoint injection Diagnostic  bilateral sacroiliacjoint block   Provider-requested follow-up: Return for Med-Mgmt, w/ Dionisio David, NP.  Future Appointments  Date Time Provider [place]  06/27/2017 11:00 AM [facility] None  06/28/2017 10:45 AM Sindy Guadeloupe, MD [facility] None  06/28/2017 11:15 AM [facility] INFUSION CHAIR 16 [facility] None  08/29/2017 10:45 AM Vevelyn Francois, NP [facility] None  09/13/2017  9:15 AM Hollice Espy, MD [facility] None  10/03/2017 10:00 AM [facility]  10/13/2017  2:15 PM [facility] None  10/13/2017  2:30 PM Sindy Guadeloupe, MD [facility] None   Primary Care Physician: Jodi Marble, MD Location: [facility] Note by: Gaspar Cola, MD Date: 06/08/2017; Time: 12:37 PM

## 2017-06-08 ENCOUNTER — Ambulatory Visit: Payer: Medicare Other | Attending: Pain Medicine | Admitting: Pain Medicine

## 2017-06-08 ENCOUNTER — Encounter: Payer: Self-pay | Admitting: Pain Medicine

## 2017-06-08 ENCOUNTER — Other Ambulatory Visit: Payer: Self-pay

## 2017-06-08 VITALS — BP 113/65 | HR 91 | Temp 98.1°F | Ht 70.0 in | Wt 180.0 lb

## 2017-06-08 DIAGNOSIS — G8929 Other chronic pain: Secondary | ICD-10-CM | POA: Diagnosis not present

## 2017-06-08 DIAGNOSIS — M5416 Radiculopathy, lumbar region: Secondary | ICD-10-CM | POA: Diagnosis not present

## 2017-06-08 DIAGNOSIS — M961 Postlaminectomy syndrome, not elsewhere classified: Secondary | ICD-10-CM | POA: Insufficient documentation

## 2017-06-08 DIAGNOSIS — F17210 Nicotine dependence, cigarettes, uncomplicated: Secondary | ICD-10-CM | POA: Insufficient documentation

## 2017-06-08 DIAGNOSIS — Z9889 Other specified postprocedural states: Secondary | ICD-10-CM | POA: Diagnosis not present

## 2017-06-08 DIAGNOSIS — Z7984 Long term (current) use of oral hypoglycemic drugs: Secondary | ICD-10-CM | POA: Diagnosis not present

## 2017-06-08 DIAGNOSIS — Z79899 Other long term (current) drug therapy: Secondary | ICD-10-CM | POA: Insufficient documentation

## 2017-06-08 DIAGNOSIS — M5442 Lumbago with sciatica, left side: Secondary | ICD-10-CM

## 2017-06-08 NOTE — Progress Notes (Signed)
Nursing Pain Medication Assessment:  Safety precautions to be maintained throughout the outpatient stay will include: orient to surroundings, keep bed in low position, maintain call bell within reach at all times, provide assistance with transfer out of bed and ambulation.  Medication Inspection Compliance: Audrey Garrett did not comply with our request to bring her pills to be counted. She was reminded that bringing the medication bottles, even when empty, is a requirement.  Medication: None brought in. Pill/Patch Count: None available to be counted. Bottle Appearance: No container available. Did not bring bottle(s) to appointment. Filled Date: N/A Last Medication intake:  Today

## 2017-06-13 ENCOUNTER — Ambulatory Visit
Admission: RE | Admit: 2017-06-13 | Discharge: 2017-06-13 | Disposition: A | Discharge status: Home / Self Care | Payer: Medicare Other | Source: Ambulatory Visit | Attending: Gastroenterology | Admitting: Gastroenterology

## 2017-06-13 DIAGNOSIS — D649 Anemia, unspecified: Secondary | ICD-10-CM | POA: Insufficient documentation

## 2017-06-13 DIAGNOSIS — M4802 Spinal stenosis, cervical region: Secondary | ICD-10-CM | POA: Insufficient documentation

## 2017-06-13 DIAGNOSIS — R1013 Epigastric pain: Secondary | ICD-10-CM | POA: Diagnosis present

## 2017-06-15 ENCOUNTER — Ambulatory Visit
Admission: RE | Admit: 2017-06-15 | Discharge: 2017-06-15 | Disposition: A | Discharge status: Home / Self Care | Payer: Medicare Other | Source: Ambulatory Visit | Attending: Gastroenterology | Admitting: Gastroenterology

## 2017-06-15 ENCOUNTER — Other Ambulatory Visit: Payer: Self-pay | Admitting: Gastroenterology

## 2017-06-15 DIAGNOSIS — R1032 Left lower quadrant pain: Secondary | ICD-10-CM | POA: Diagnosis present

## 2017-06-15 DIAGNOSIS — R1031 Right lower quadrant pain: Secondary | ICD-10-CM | POA: Diagnosis present

## 2017-06-20 ENCOUNTER — Telehealth: Payer: Self-pay | Admitting: *Deleted

## 2017-06-20 DIAGNOSIS — D7589 Other specified diseases of blood and blood-forming organs: Secondary | ICD-10-CM

## 2017-06-20 DIAGNOSIS — D500 Iron deficiency anemia secondary to blood loss (chronic): Secondary | ICD-10-CM

## 2017-06-20 DIAGNOSIS — D539 Nutritional anemia, unspecified: Secondary | ICD-10-CM

## 2017-06-20 NOTE — Telephone Encounter (Signed)
I called patient after receiving a fax from Dr. Gustavo Lah for hgb dropping.  I asked pt to come 4/1 to get labs at 11 am and then 4/2 at 10:45 and then feraheme after. Patient agreeable and I sent inbasket to get the appt put in computer

## 2017-06-27 ENCOUNTER — Other Ambulatory Visit: Payer: Self-pay

## 2017-06-27 ENCOUNTER — Inpatient Hospital Stay: Payer: Medicare Other | Attending: Oncology

## 2017-06-27 DIAGNOSIS — B1920 Unspecified viral hepatitis C without hepatic coma: Secondary | ICD-10-CM | POA: Insufficient documentation

## 2017-06-27 DIAGNOSIS — D509 Iron deficiency anemia, unspecified: Secondary | ICD-10-CM | POA: Insufficient documentation

## 2017-06-27 DIAGNOSIS — C3411 Malignant neoplasm of upper lobe, right bronchus or lung: Secondary | ICD-10-CM | POA: Insufficient documentation

## 2017-06-27 DIAGNOSIS — K7460 Unspecified cirrhosis of liver: Secondary | ICD-10-CM | POA: Diagnosis not present

## 2017-06-27 DIAGNOSIS — C649 Malignant neoplasm of unspecified kidney, except renal pelvis: Secondary | ICD-10-CM | POA: Diagnosis not present

## 2017-06-27 DIAGNOSIS — D500 Iron deficiency anemia secondary to blood loss (chronic): Secondary | ICD-10-CM

## 2017-06-27 DIAGNOSIS — D539 Nutritional anemia, unspecified: Secondary | ICD-10-CM

## 2017-06-27 LAB — CBC WITH DIFFERENTIAL/PLATELET
Basophils Absolute: 0 10*3/uL (ref 0–0.1)
Basophils Relative: 0 %
Eosinophils Absolute: 0.1 10*3/uL (ref 0–0.7)
Eosinophils Relative: 1 %
HCT: 35.4 % (ref 35.0–47.0)
Hemoglobin: 12.1 g/dL (ref 12.0–16.0)
Lymphocytes Relative: 24 %
Lymphs Abs: 2 10*3/uL (ref 1.0–3.6)
MCH: 32.8 pg (ref 26.0–34.0)
MCHC: 34.2 g/dL (ref 32.0–36.0)
MCV: 96 fL (ref 80.0–100.0)
Monocytes Absolute: 1 10*3/uL — ABNORMAL HIGH (ref 0.2–0.9)
Monocytes Relative: 12 %
Neutro Abs: 5.4 10*3/uL (ref 1.4–6.5)
Neutrophils Relative %: 63 %
Platelets: 173 10*3/uL (ref 150–440)
RBC: 3.69 MIL/uL — ABNORMAL LOW (ref 3.80–5.20)
RDW: 28.4 % — ABNORMAL HIGH (ref 11.5–14.5)
WBC: 8.4 10*3/uL (ref 3.6–11.0)

## 2017-06-27 LAB — IRON AND TIBC
Iron: 261 ug/dL — ABNORMAL HIGH (ref 28–170)
Saturation Ratios: 84 % — ABNORMAL HIGH (ref 10.4–31.8)
TIBC: 310 ug/dL (ref 250–450)
UIBC: 49 ug/dL

## 2017-06-27 LAB — VITAMIN B12: Vitamin B-12: 868 pg/mL (ref 180–914)

## 2017-06-27 LAB — FERRITIN: Ferritin: 65 ng/mL (ref 11–307)

## 2017-06-27 LAB — FOLATE: Folate: 19.8 ng/mL (ref >5.9)

## 2017-06-28 ENCOUNTER — Encounter: Payer: Self-pay | Admitting: Oncology

## 2017-06-28 ENCOUNTER — Inpatient Hospital Stay: Payer: Medicare Other

## 2017-06-28 ENCOUNTER — Inpatient Hospital Stay (HOSPITAL_BASED_OUTPATIENT_CLINIC_OR_DEPARTMENT_OTHER): Payer: Medicare Other | Admitting: Oncology

## 2017-06-28 VITALS — BP 112/77 | HR 91 | Temp 98.7°F | Resp 18 | Ht 70.0 in | Wt 178.6 lb

## 2017-06-28 DIAGNOSIS — B1920 Unspecified viral hepatitis C without hepatic coma: Secondary | ICD-10-CM | POA: Diagnosis not present

## 2017-06-28 DIAGNOSIS — N2889 Other specified disorders of kidney and ureter: Secondary | ICD-10-CM

## 2017-06-28 DIAGNOSIS — C649 Malignant neoplasm of unspecified kidney, except renal pelvis: Secondary | ICD-10-CM

## 2017-06-28 DIAGNOSIS — K7460 Unspecified cirrhosis of liver: Secondary | ICD-10-CM

## 2017-06-28 DIAGNOSIS — D509 Iron deficiency anemia, unspecified: Secondary | ICD-10-CM | POA: Diagnosis not present

## 2017-06-28 DIAGNOSIS — C3411 Malignant neoplasm of upper lobe, right bronchus or lung: Secondary | ICD-10-CM

## 2017-06-30 NOTE — Progress Notes (Signed)
Hematology/Oncology Consult note [facility]  [phone] [phone]  Patient Care Team: Jodi Marble, MD as PCP - General (Internal Medicine)   Name of the patient: Audrey Garrett  [id number]  Feb 25, 1961   Date of visit: 06/30/17  Diagnosis- 1. Iron deficiency anemia  2. Renal cell carcinoma  3. Stage I lung cancer [id number]  Chief complaint/ Reason for visit- discuss CT scan results  Heme/Onc history: Patient is a 57 year old female with a h/o liver cirrhosis and is currently receiving Harvoni for hepatitis C. GI evaluation is as follows:  Upper EGD on 04/01/16 showed multiple dispersed diminutive erosions were found in the gastric antrum. There were no stigmata of recent bleeding. One healing cratered gastric ulcer with no stigmata of bleeding was found in the prepyloric region of the stomach. The lesion was 2 mm in largest dimension. Colonoscopy on 04/01/16 showed multiple small-mouthed diverticula were found in the sigmoid colon and descending colon. A single small localized angioectasia with typical arborization was found in the proximal ascending colon. Coagulation for tissue destruction using argon plasma at 0.5 liters/minute and 20 watts was successful.  She has received significant blood transfusions recently since March 2017. Her last blood transfusion was on 03/17/16. She had 2 units of blood She received 4 doses of IV Venofer  In Jan 2018 she received blood trasnsfusion followed by ferraheme.   During her visit in February 2018 her H&H was 10.3/ 31.6. IV iron was held off and she was supposed to get cbc checked in1 month. Patient missed that appointment and became progressively iron deficient again when her hb dropped to 7.9. She received 1 unit of blood transfusion  Patient continued to feel fatigued despite improvement in her anemia. Given her strong h/o smoking CT lung cancer screenign was obtained which  showed: IMPRESSION: 11.6 mm spiculated right lung nodule just anterior to the major fissure with associated fissure all retraction. Lung-RADS Category 4A, suspicious. Follow up low-dose chest CT without contrast in 3 months (please use the following order, "CT CHEST LCS NODULE FOLLOW-UP W/O CM") is recommended. Alternatively, PET may be considered when there is a solid component 38mm or larger.  Other bilateral pulmonary nodules evident. Attention on follow-up.   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 She did not wish to pursue surgery  She sees DR. Erlene Quan for her renal mass. She is not a candidate for surgery and did not desire cryoablation.  Interval history- continues to have significant fatigue. She has lost 4 pounds in the last 2 months. Has chronic back pain. Denies other complaints  ECOG PS- 2 Pain scale- 7   Review of systems- Review of Systems  Constitutional: Positive for malaise/fatigue. Negative for chills, fever and weight loss.  HENT: Negative for congestion, ear discharge and nosebleeds.   Eyes: Negative for blurred vision.  Respiratory: Negative for cough, hemoptysis, sputum production, shortness of breath and wheezing.   Cardiovascular: Negative for chest pain, palpitations, orthopnea and claudication.  Gastrointestinal: Negative for abdominal pain, blood in stool, constipation, diarrhea, heartburn, melena, nausea and vomiting.  Genitourinary: Negative for dysuria, flank pain, frequency, hematuria and urgency.  Musculoskeletal: Positive for back pain. Negative for joint pain and myalgias.  Skin: Negative for rash.  Neurological: Negative for dizziness, tingling, focal weakness, seizures, weakness and headaches.  Endo/Heme/Allergies: Does not bruise/bleed easily.  Psychiatric/Behavioral: Negative for depression  and suicidal ideas. The  patient does not have insomnia.      No Known Allergies   Past Medical History:  Diagnosis Date  . Alcohol abuse   . Alcoholic cirrhosis of liver without ascites ([place])   . Anemia   . Anxiety   . Arthropathy   . Back pain   . Bronchitis   . BRONCHITIS, ACUTE 11/01/2009   Qualifier: Diagnosis of  By: Alveta Heimlich MD, Cornelia Copa    . Chronic hepatitis C ([place])   . Chronic LBP 12/25/2014   Overview:  Post extensive surgery   . Cirrhosis ([place])   . DDD (degenerative disc disease), lumbar 01/01/2015  . Depression   . Diabetes mellitus without complication ([place])   . Diverticulosis   . Edema   . Fatigue   . Fibromyalgia   . GERD (gastroesophageal reflux disease)    gastroparesis  . High risk medications (not anticoagulants) long-term use   . Hyperglycemia   . Hypertension   . Kidney mass    11/17 small mass-  . Left leg pain 01/01/2015  . Lumbago   . Lumbar radicular pain 01/01/2015  . Muscle spasm   . Neck pain 01/01/2015  . Polyarthritis   . Reflux   . Restless leg syndrome    01/2016  . RLS (restless legs syndrome)   . Sacroiliac pain 01/01/2015  . Shoulder pain   . Sinusitis   . SOB (shortness of breath)   . Spine disorder   . Stress headaches   . Upper back pain 01/01/2015  . Urinary frequency   . Vitamin D deficiency disease      Past Surgical History:  Procedure Laterality Date  . BACK SURGERY  12/19/2011  . BACK SURGERY    . COLONOSCOPY WITH PROPOFOL N/A 09/25/2015   Procedure: COLONOSCOPY WITH PROPOFOL;  Surgeon: Lollie Sails, MD;  Location: [facility] ENDOSCOPY;  Service: Endoscopy;  Laterality: N/A;  . COLONOSCOPY WITH PROPOFOL N/A 04/01/2016   Procedure: COLONOSCOPY WITH PROPOFOL;  Surgeon: Lollie Sails, MD;  Location: [facility] ENDOSCOPY;  Service: Endoscopy;  Laterality: N/A;  . ELECTROMAGNETIC NAVIGATION BROCHOSCOPY N/A 11/25/2016   Procedure: ELECTROMAGNETIC NAVIGATION BRONCHOSCOPY;  Surgeon: Flora Lipps, MD;  Location: [facility] ORS;  Service: Cardiopulmonary;  Laterality:  N/A;  . ESOPHAGOGASTRODUODENOSCOPY N/A 04/14/2015   Procedure: ESOPHAGOGASTRODUODENOSCOPY (EGD);  Surgeon: Lollie Sails, MD;  Location: [facility] ENDOSCOPY;  Service: Endoscopy;  Laterality: N/A;  . ESOPHAGOGASTRODUODENOSCOPY (EGD) WITH PROPOFOL N/A 09/16/2015   Procedure: ESOPHAGOGASTRODUODENOSCOPY (EGD) WITH PROPOFOL;  Surgeon: Lollie Sails, MD;  Location: [facility] ENDOSCOPY;  Service: Endoscopy;  Laterality: N/A;  . ESOPHAGOGASTRODUODENOSCOPY (EGD) WITH PROPOFOL N/A 01/22/2016   Procedure: ESOPHAGOGASTRODUODENOSCOPY (EGD) WITH PROPOFOL;  Surgeon: [name], MD;  Location: [facility];  Service: Endoscopy;  Laterality: N/A;  . ESOPHAGOGASTRODUODENOSCOPY (EGD) WITH PROPOFOL N/A 04/01/2016   Procedure: ESOPHAGOGASTRODUODENOSCOPY (EGD) WITH PROPOFOL;  Surgeon: Lollie Sails, MD;  Location: [facility] ENDOSCOPY;  Service: Endoscopy;  Laterality: N/A;  . HERNIA REPAIR N/A 10/2015   abdominal   . IR RADIOLOGIST EVAL & MGMT  04/13/2017  . TONSILLECTOMY    . TUBAL LIGATION    . UMBILICAL HERNIA REPAIR N/A 11/06/2015   Procedure: HERNIA REPAIR UMBILICAL ADULT;  Surgeon: [name], MD;  Location: [facility] ORS;  Service: General;  Laterality: N/A;    Social History   Socioeconomic History  . Marital status: Married    Spouse name: Not on file  . Number of children: Not on file  . Years  of education: Not on file  . Highest education level: Not on file  Occupational History  . Not on file  Social Needs  . Financial resource strain: Not on file  . Food insecurity:    Worry: Not on file    Inability: Not on file  . Transportation needs:    Medical: Not on file    Non-medical: Not on file  Tobacco Use  . Smoking status: Current Every Day Smoker    Packs/day: 1.00    Years: 42.00    Pack years: 42.00    Types: Cigarettes  . Smokeless tobacco: Never Used  . Tobacco comment: 2ppd until last 2 years  Substance and Sexual Activity  . Alcohol use: No    Alcohol/week: 0.0 oz     Comment: stopped drinking 05/12/15  . Drug use: No  . Sexual activity: Not on file  Lifestyle  . Physical activity:    Days per week: Not on file    Minutes per session: Not on file  . Stress: Not on file  Relationships  . Social connections:    Talks on phone: Not on file    Gets together: Not on file    Attends religious service: Not on file    Active member of club or organization: Not on file    Attends meetings of clubs or organizations: Not on file    Relationship status: Not on file  . Intimate partner violence:    Fear of current or ex partner: Not on file    Emotionally abused: Not on file    Physically abused: Not on file    Forced sexual activity: Not on file  Other Topics Concern  . Not on file  Social History Narrative   Lives at home with husband    Family History  Problem Relation Age of Onset  . Stroke Mother   . Heart disease Mother   . [name] Father   . Breast cancer Sister 76  . Kidney disease Neg Hx   . Bladder Cancer Neg Hx   . Kidney cancer Neg Hx   . Prostate cancer Neg Hx      Current Outpatient Medications:  .  furosemide (LASIX) 40 MG tablet, Take 40 mg by mouth 2 (two) times daily. , Disp: , Rfl:  .  lactulose (CHRONULAC) 10 GM/15ML solution, Take 10 g by mouth 3 (three) times daily as needed for mild constipation or moderate constipation. , Disp: , Rfl:  .  LYRICA 150 MG capsule, Take 150 mg by mouth 2 (two) times daily., Disp: , Rfl: 2 .  magnesium oxide (MAG-OX) 400 MG tablet, Take 400 mg by mouth daily., Disp: , Rfl:  .  metFORMIN (GLUCOPHAGE) 500 MG tablet, Take 500 mg by mouth 3 (three) times daily after meals., Disp: , Rfl:  .  Multiple Vitamins-Minerals (CENTRUM SILVER 50+WOMEN PO), Take 1 tablet by mouth daily., Disp: , Rfl:  .  [START ON 08/15/2017] Oxycodone HCl 20 MG TABS, Take 1 tablet (20 mg total) by mouth every 6 (six) hours., Disp: 120 tablet, Rfl: 0 .  pantoprazole (PROTONIX) 40 MG tablet, Take 40 mg by mouth twice daily.,  Disp: , Rfl: 11 .  PARoxetine (PAXIL) 40 MG tablet, Take 40 mg by mouth every morning., Disp: , Rfl: 2 .  spironolactone (ALDACTONE) 50 MG tablet, TAKE 2 TABLETS BY MOUTH EVERY DAY, Disp: , Rfl:  .  thiamine (VITAMIN B-1) 100 MG tablet, Take 1 tablet (100 mg total) by  mouth daily., Disp: 90 tablet, Rfl: 6 .  TRADJENTA 5 MG TABS tablet, Take 5 mg by mouth daily., Disp: , Rfl: 2 .  [START ON 07/16/2017] Oxycodone HCl 20 MG TABS, Take 1 tablet (20 mg total) by mouth every 6 (six) hours., Disp: 120 tablet, Rfl: 0 .  Oxycodone HCl 20 MG TABS, Take 1 tablet (20 mg total) by mouth every 6 (six) hours as needed., Disp: 120 tablet, Rfl: 0  Physical exam:  Vitals:   06/28/17 1055  BP: 112/77  Pulse: 91  Resp: 18  Temp: 98.7 F (37.1 C)  TempSrc: Tympanic  SpO2: 97%  Weight: 178 lb 9.6 oz (81 kg)  Height: 5\' 10"  (1.778 m)   Physical Exam  Constitutional: She is oriented to person, place, and time.  Appears fatigued  HENT:  Head: Normocephalic and atraumatic.  Eyes: Pupils are equal, round, and reactive to light. EOM are normal.  Neck: Normal range of motion.  Cardiovascular: Normal rate, regular rhythm and normal heart sounds.  Pulmonary/Chest: Effort normal and breath sounds normal.  Abdominal: Soft. Bowel sounds are normal.  Musculoskeletal: She exhibits no edema.  Neurological: She is alert and oriented to person, place, and time.  Skin: Skin is warm and dry.     CMP Latest Ref Rng & Units 04/23/2017  Glucose 65 - 99 mg/dL 160(H)  BUN 6 - 20 mg/dL 11  Creatinine 0.44 - 1.00 mg/dL 0.72  Sodium 135 - 145 mmol/L 130(L)  Potassium 3.5 - 5.1 mmol/L 4.0  Chloride 101 - 111 mmol/L 93(L)  CO2 22 - 32 mmol/L 25  Calcium 8.9 - 10.3 mg/dL 8.8(L)  Total Protein 6.5 - 8.1 g/dL 7.5  Total Bilirubin 0.3 - 1.2 mg/dL 0.6  Alkaline Phos 38 - 126 U/L 90  AST 15 - 41 U/L 68(H)  ALT 14 - 54 U/L 35   CBC Latest Ref Rng & Units 06/27/2017  WBC 3.6 - 11.0 K/uL 8.4  Hemoglobin 12.0 - 16.0 g/dL 12.1   Hematocrit 35.0 - 47.0 % 35.4  Platelets 150 - 440 K/uL 173    No images are attached to the encounter.  Dg Abd 2 Views  Result Date: 06/16/2017 CLINICAL DATA:  Bilateral lower abdominal pain for the past week. Upper GI performed on 06/13/2017. EXAM: ABDOMEN - 2 VIEW COMPARISON:  Upper GI series dated 06/13/2017 and chest CT dated 11/01/2016. FINDINGS: Normal bowel gas pattern with barium scattered throughout the colon and in the appendix. Extensive fixation hardware is again noted in the thoracic and lumbar spine and bilateral pelvis. Linear atelectasis or scarring in the right mid lung zone. IMPRESSION: No acute abnormality. Electronically Signed   By: Claudie Revering M.D.   On: 06/16/2017 10:35   Dg Ugi W/high Density W/kub  Result Date: 06/13/2017 CLINICAL DATA:  Chronic sensation of food becoming stuck in the esophagus. The patient has extreme difficulty swallowing pills. The patient reports considerable phlegm production for the past 3-4 months. Patient reports long-term problems with occasional episodes of swallowing. EXAM: UPPER GI SERIES WITH KUB TECHNIQUE: After obtaining a scout radiograph a routine upper GI series was performed using thin and high density barium. Effervescent crystals and a barium tablet were administered. FLUOROSCOPY TIME:  Fluoroscopy Time:  1 minutes, 36 seconds Radiation Exposure Index (if provided by the fluoroscopic device): 51.60 Number of Acquired Spot Images: 10 +2 video loops. COMPARISON:  No recent studies in [facility] FINDINGS: The scout radiograph reveals Harrington rods throughout the lumbar spine in the lower  half of the thoracic spine. The bowel gas pattern is normal. The patient ingested thick and thin barium without difficulty. The hypopharynx distended well. There was no laryngeal penetration of the barium. 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 This was obstructive to passage of the barium tablet. It was not  obstructive to passage of liquid barium. The thoracic esophagus distended well. There was no hiatal hernia. The stomach distended reasonably well. The mucosal fold pattern was thickened. No ulcer niche was observed. The duodenal bulb and C sweep were grossly normal. 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 This was obstructive to passage of the barium tablet. The tablet ultimately passed with additional sips of water and thin barium. Direct visualization is recommended. Normal appearing thoracic esophagus. Thickened gastric mucosal folds without evidence of an ulcer niche. Gastric emptying was prompt. The duodenum exhibited no acute abnormality. Electronically Signed   By: David  Martinique M.D.   On: 06/13/2017 10:00     Assessment and plan- Patient is a 57 y.o. female with following medical issues:  1. Stage I lung cancer s/p SBRT: repeat Ct thorax with contrast 3 months from now and see her post scans  2. Iron deficiency anemia- hb stable around 12. No evidence of iron deficiency. Iron saturation high at 84% which we will monitor. Repeat cbc ferritin and iron studies in 3 months  3. Renal mass- follows up with Dr. Erlene Quan. Will order CT abdomen with and without contrast in 3 months  4. H/o cirrhosis- she sees [facility] for this and [facility] surveillance   Visit Diagnosis 1. Kidney mass   2. Malignant neoplasm of upper lobe of right lung ([place])   3. Iron deficiency anemia, unspecified iron deficiency anemia type      Dr. Randa Evens, MD, MPH [facility] at [facility] [phone] 06/30/2017 11:41 AM

## 2017-07-07 ENCOUNTER — Other Ambulatory Visit: Payer: Medicare Other

## 2017-07-22 IMAGING — US US ABDOMEN LIMITED
1 series · 9 of 9 positions shown · non-contrast
Comparison: May 06, 2015

CLINICAL DATA: Abdominal distention.  Hepatic cirrhosis.

EXAM:
LIMITED ABDOMEN ULTRASOUND FOR ASCITES
TECHNIQUE: Limited ultrasound survey for ascites was performed in all four
abdominal quadrants.

[Series 1: us abdomen limited · 0.30mm/px · 9 of 9 slices shown]
[im 1/9]
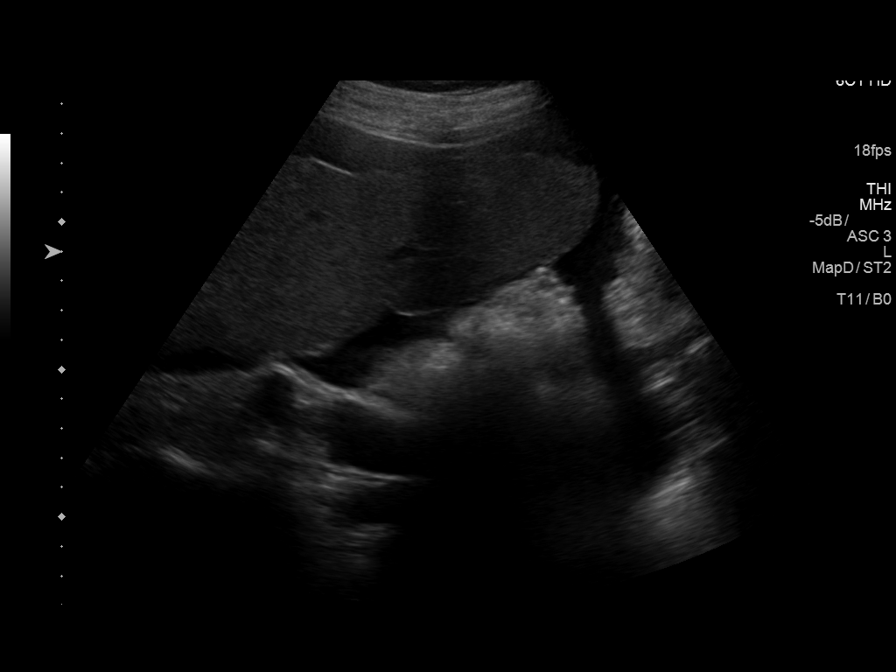
[im 2/9]
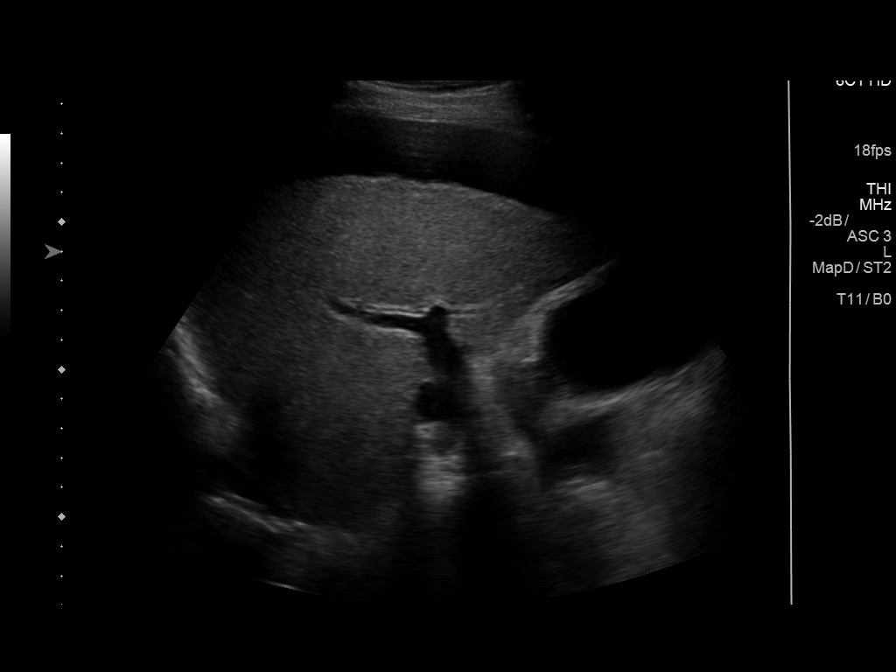
[im 3/9]
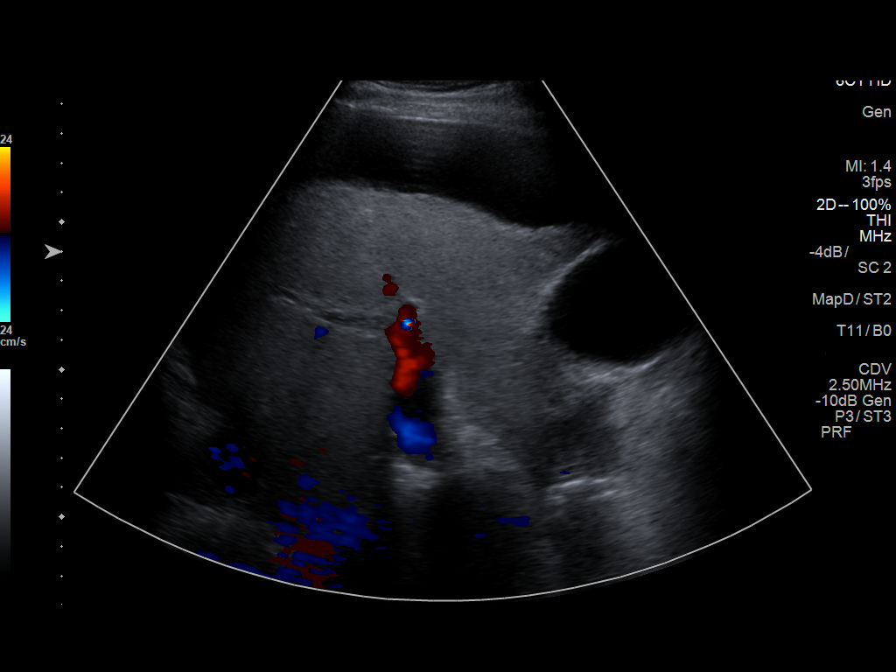
[im 4/9]
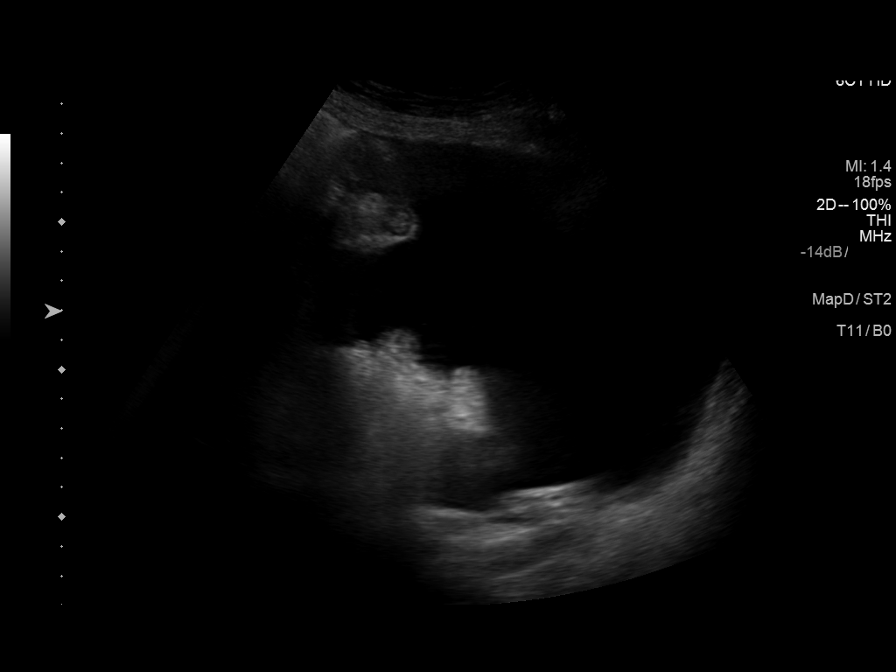
[im 5/9]
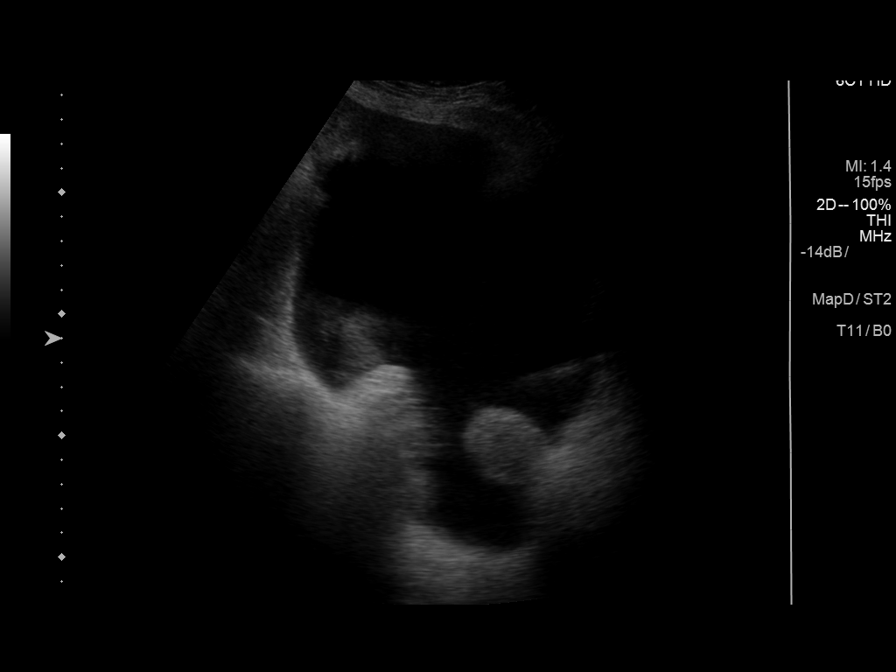
[im 6/9]
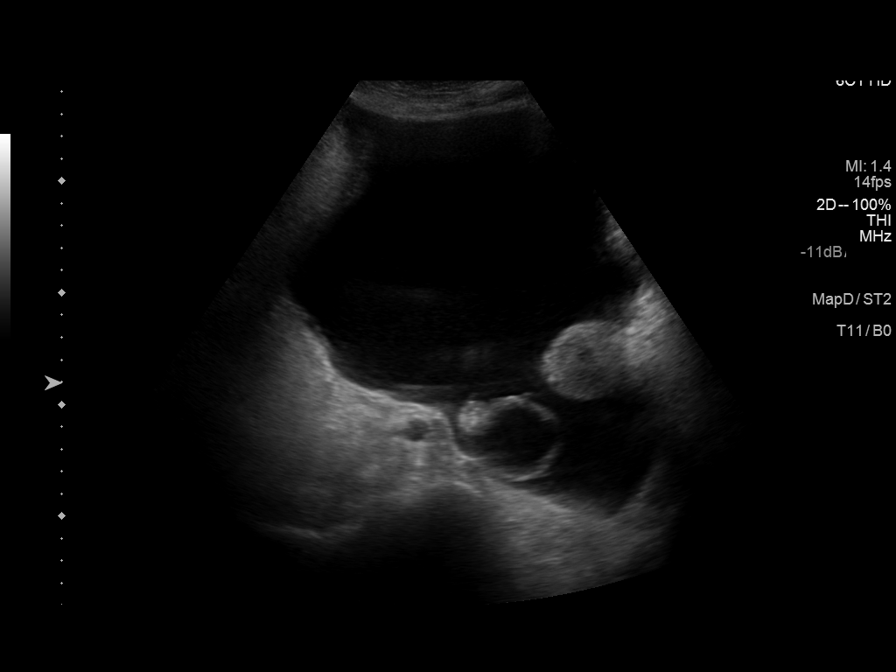
[im 7/9]
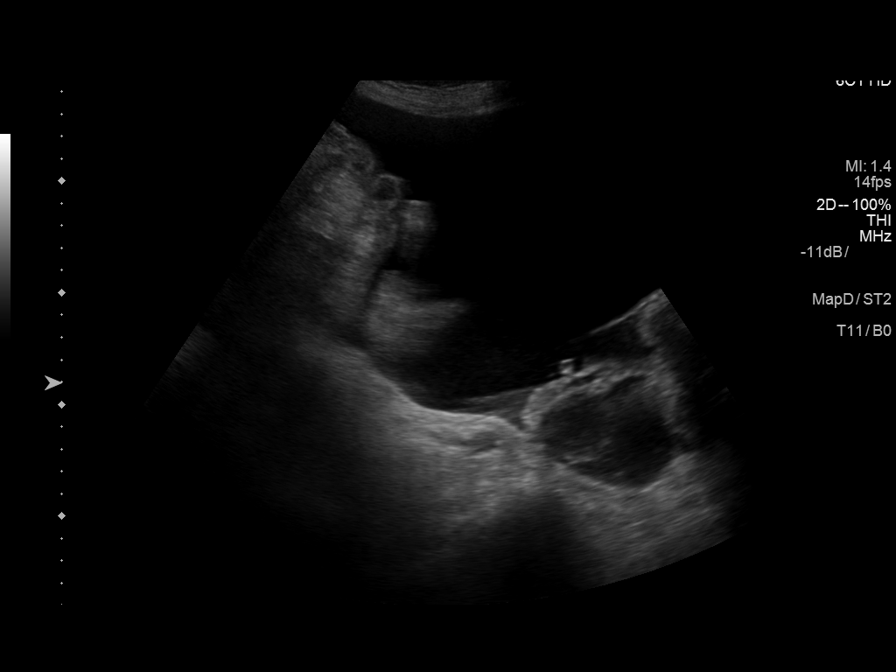
[im 8/9]
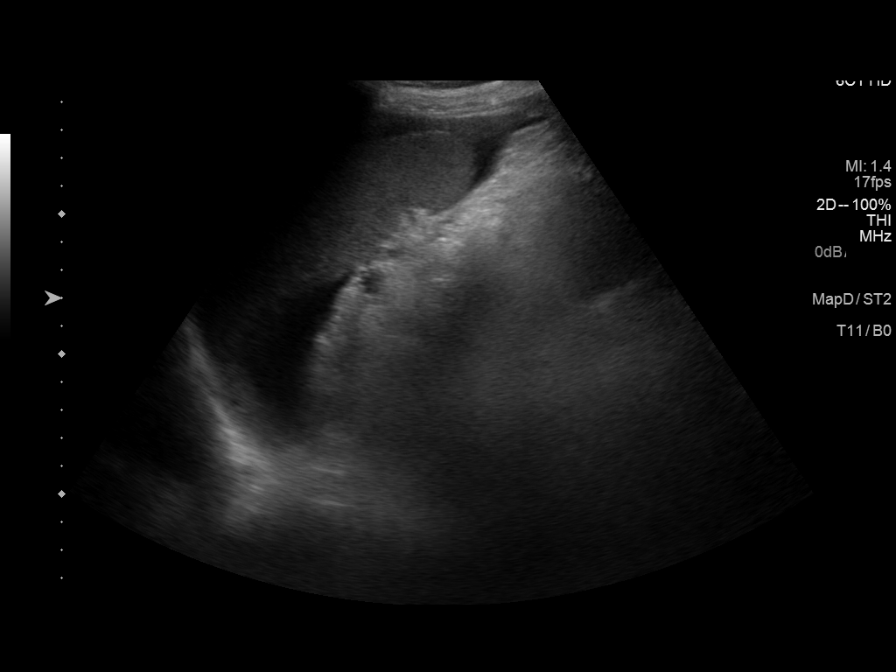
[im 9/9]
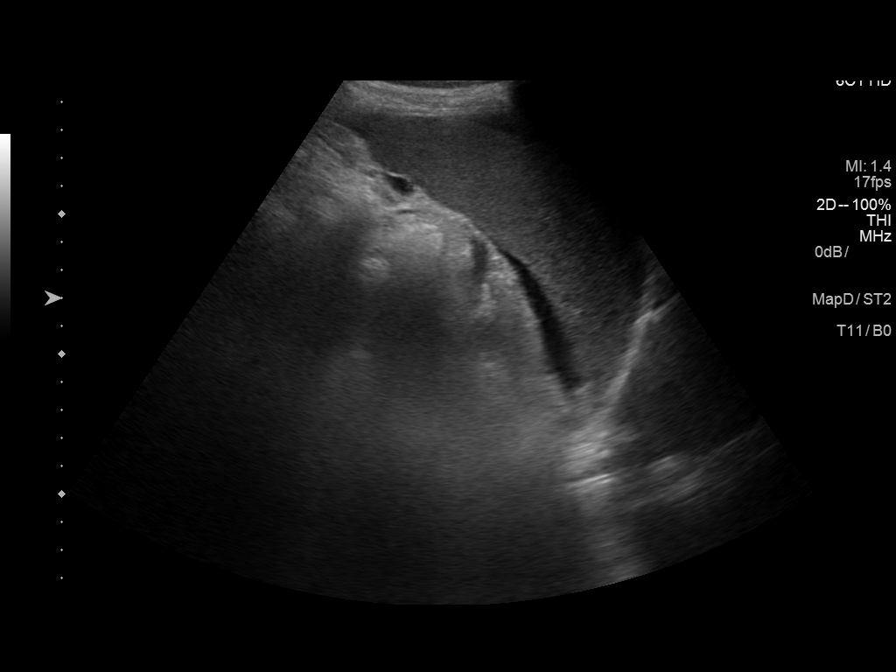

[9 of 9 positions shown; findings below may reference images not displayed]

FINDINGS: There is moderate ascites throughout the abdomen and pelvis. Liver
has a nodular contour consistent with known cirrhosis.
IMPRESSION: Moderate generalized ascites.

## 2017-08-23 ENCOUNTER — Telehealth: Payer: Self-pay | Admitting: *Deleted

## 2017-08-23 DIAGNOSIS — T148XXA Other injury of unspecified body region, initial encounter: Secondary | ICD-10-CM

## 2017-08-23 DIAGNOSIS — D509 Iron deficiency anemia, unspecified: Secondary | ICD-10-CM

## 2017-08-23 NOTE — Telephone Encounter (Signed)
Patient called asking for an appointment because she is having bruising on her arms. She states she called GI who advised she call us. Discussed with Dr Elroy Channel nurse and Dr Tasia Catchings and Patient to come in for lab only tomorrow CBC Patient/ PTT ordered per VO Dr Tasia Catchings

## 2017-08-24 ENCOUNTER — Inpatient Hospital Stay: Payer: Medicare Other | Attending: Oncology

## 2017-08-24 DIAGNOSIS — B182 Chronic viral hepatitis C: Secondary | ICD-10-CM | POA: Diagnosis present

## 2017-08-24 DIAGNOSIS — K7460 Unspecified cirrhosis of liver: Secondary | ICD-10-CM | POA: Diagnosis not present

## 2017-08-24 DIAGNOSIS — T148XXA Other injury of unspecified body region, initial encounter: Secondary | ICD-10-CM

## 2017-08-24 DIAGNOSIS — D509 Iron deficiency anemia, unspecified: Secondary | ICD-10-CM

## 2017-08-24 LAB — CBC WITH DIFFERENTIAL/PLATELET
Basophils Absolute: 0 10*3/uL (ref 0–0.1)
Basophils Relative: 0 %
Eosinophils Absolute: 0.1 10*3/uL (ref 0–0.7)
Eosinophils Relative: 1 %
HCT: 36.3 % (ref 35.0–47.0)
Hemoglobin: 12.7 g/dL (ref 12.0–16.0)
Lymphocytes Relative: 19 %
Lymphs Abs: 1.5 10*3/uL (ref 1.0–3.6)
MCH: 37.6 pg — ABNORMAL HIGH (ref 26.0–34.0)
MCHC: 35.1 g/dL (ref 32.0–36.0)
MCV: 107.1 fL — ABNORMAL HIGH (ref 80.0–100.0)
Monocytes Absolute: 1 10*3/uL — ABNORMAL HIGH (ref 0.2–0.9)
Monocytes Relative: 12 %
Neutro Abs: 5.5 10*3/uL (ref 1.4–6.5)
Neutrophils Relative %: 68 %
Platelets: 151 10*3/uL (ref 150–440)
RBC: 3.39 MIL/uL — ABNORMAL LOW (ref 3.80–5.20)
RDW: 14.5 % (ref 11.5–14.5)
WBC: 8.1 10*3/uL (ref 3.6–11.0)

## 2017-08-24 LAB — PROTIME-INR
INR: 1.16
Prothrombin Time: 14.7 seconds (ref 11.4–15.2)

## 2017-08-24 LAB — APTT: aPTT: 35 seconds (ref 24–36)

## 2017-08-29 ENCOUNTER — Encounter: Payer: Medicare Other | Admitting: Nurse Practitioner

## 2017-08-29 ENCOUNTER — Telehealth: Payer: Self-pay | Admitting: *Deleted

## 2017-08-29 NOTE — Telephone Encounter (Signed)
I can see her next week. Her coagulation tests were normal

## 2017-08-29 NOTE — Telephone Encounter (Signed)
Per Verdis Frederickson patient does not want to see Dr Janese Banks, she  Is going to see her liver doctor

## 2017-08-29 NOTE — Telephone Encounter (Signed)
Patient called asking for lab results, she reports that she is having a LOT of bruising and she will not go out of the house due to the amt on her arms. She is asking if she can be seen ASAP to see what can be done about it.

## 2017-08-31 ENCOUNTER — Telehealth: Payer: Self-pay | Admitting: Radiology

## 2017-08-31 ENCOUNTER — Ambulatory Visit: Payer: Medicare Other | Attending: Nurse Practitioner | Admitting: Nurse Practitioner

## 2017-08-31 ENCOUNTER — Encounter: Payer: Self-pay | Admitting: Nurse Practitioner

## 2017-08-31 ENCOUNTER — Other Ambulatory Visit: Payer: Self-pay

## 2017-08-31 VITALS — BP 108/57 | HR 85 | Temp 98.2°F | Resp 18 | Ht 70.0 in | Wt 155.0 lb

## 2017-08-31 DIAGNOSIS — G8929 Other chronic pain: Secondary | ICD-10-CM | POA: Diagnosis not present

## 2017-08-31 DIAGNOSIS — M25552 Pain in left hip: Secondary | ICD-10-CM | POA: Insufficient documentation

## 2017-08-31 DIAGNOSIS — G894 Chronic pain syndrome: Secondary | ICD-10-CM | POA: Diagnosis not present

## 2017-08-31 DIAGNOSIS — E1165 Type 2 diabetes mellitus with hyperglycemia: Secondary | ICD-10-CM | POA: Insufficient documentation

## 2017-08-31 DIAGNOSIS — D62 Acute posthemorrhagic anemia: Secondary | ICD-10-CM | POA: Insufficient documentation

## 2017-08-31 DIAGNOSIS — M9981 Other biomechanical lesions of cervical region: Secondary | ICD-10-CM

## 2017-08-31 DIAGNOSIS — D696 Thrombocytopenia, unspecified: Secondary | ICD-10-CM | POA: Diagnosis not present

## 2017-08-31 DIAGNOSIS — M47816 Spondylosis without myelopathy or radiculopathy, lumbar region: Secondary | ICD-10-CM | POA: Diagnosis not present

## 2017-08-31 DIAGNOSIS — K7460 Unspecified cirrhosis of liver: Secondary | ICD-10-CM | POA: Insufficient documentation

## 2017-08-31 DIAGNOSIS — M545 Low back pain: Secondary | ICD-10-CM | POA: Insufficient documentation

## 2017-08-31 DIAGNOSIS — K420 Umbilical hernia with obstruction, without gangrene: Secondary | ICD-10-CM | POA: Diagnosis not present

## 2017-08-31 DIAGNOSIS — M549 Dorsalgia, unspecified: Secondary | ICD-10-CM

## 2017-08-31 DIAGNOSIS — E1142 Type 2 diabetes mellitus with diabetic polyneuropathy: Secondary | ICD-10-CM | POA: Diagnosis not present

## 2017-08-31 DIAGNOSIS — M4802 Spinal stenosis, cervical region: Secondary | ICD-10-CM | POA: Insufficient documentation

## 2017-08-31 DIAGNOSIS — I959 Hypotension, unspecified: Secondary | ICD-10-CM | POA: Insufficient documentation

## 2017-08-31 DIAGNOSIS — M5412 Radiculopathy, cervical region: Secondary | ICD-10-CM | POA: Diagnosis not present

## 2017-08-31 DIAGNOSIS — M533 Sacrococcygeal disorders, not elsewhere classified: Secondary | ICD-10-CM | POA: Diagnosis not present

## 2017-08-31 DIAGNOSIS — K219 Gastro-esophageal reflux disease without esophagitis: Secondary | ICD-10-CM | POA: Insufficient documentation

## 2017-08-31 DIAGNOSIS — M160 Bilateral primary osteoarthritis of hip: Secondary | ICD-10-CM | POA: Insufficient documentation

## 2017-08-31 DIAGNOSIS — M542 Cervicalgia: Secondary | ICD-10-CM | POA: Diagnosis not present

## 2017-08-31 DIAGNOSIS — F1120 Opioid dependence, uncomplicated: Secondary | ICD-10-CM | POA: Insufficient documentation

## 2017-08-31 DIAGNOSIS — M25551 Pain in right hip: Secondary | ICD-10-CM | POA: Insufficient documentation

## 2017-08-31 DIAGNOSIS — M79605 Pain in left leg: Secondary | ICD-10-CM | POA: Insufficient documentation

## 2017-08-31 DIAGNOSIS — C3411 Malignant neoplasm of upper lobe, right bronchus or lung: Secondary | ICD-10-CM | POA: Diagnosis not present

## 2017-08-31 DIAGNOSIS — G2581 Restless legs syndrome: Secondary | ICD-10-CM | POA: Diagnosis not present

## 2017-08-31 DIAGNOSIS — Z79899 Other long term (current) drug therapy: Secondary | ICD-10-CM | POA: Insufficient documentation

## 2017-08-31 DIAGNOSIS — G4700 Insomnia, unspecified: Secondary | ICD-10-CM | POA: Insufficient documentation

## 2017-08-31 DIAGNOSIS — K7030 Alcoholic cirrhosis of liver without ascites: Secondary | ICD-10-CM | POA: Insufficient documentation

## 2017-08-31 DIAGNOSIS — Z79891 Long term (current) use of opiate analgesic: Secondary | ICD-10-CM | POA: Insufficient documentation

## 2017-08-31 DIAGNOSIS — Z7984 Long term (current) use of oral hypoglycemic drugs: Secondary | ICD-10-CM | POA: Insufficient documentation

## 2017-08-31 DIAGNOSIS — R188 Other ascites: Secondary | ICD-10-CM | POA: Insufficient documentation

## 2017-08-31 DIAGNOSIS — Z981 Arthrodesis status: Secondary | ICD-10-CM | POA: Insufficient documentation

## 2017-08-31 DIAGNOSIS — M797 Fibromyalgia: Secondary | ICD-10-CM | POA: Insufficient documentation

## 2017-08-31 MED ORDER — OXYCODONE HCL 20 MG PO TABS: 1.0000 | ORAL_TABLET | Freq: Four times a day (QID) | ORAL | 0 refills | Status: DC | PRN

## 2017-08-31 MED ORDER — OXYCODONE HCL 20 MG PO TABS: 1.0000 | ORAL_TABLET | Freq: Four times a day (QID) | ORAL | 0 refills | Status: DC

## 2017-08-31 NOTE — Progress Notes (Signed)
Nursing Pain Medication Assessment:  Safety precautions to be maintained throughout the outpatient stay will include: orient to surroundings, keep bed in low position, maintain call bell within reach at all times, provide assistance with transfer out of bed and ambulation.  Medication Inspection Compliance: Pill count conducted under aseptic conditions, in front of the patient. Neither the pills nor the bottle was removed from the patient's sight at any time. Once count was completed pills were immediately returned to the patient in their original bottle.  Medication: Oxycodone ER (OxyContin) Pill/Patch Count: 80 of 120 pills remain Pill/Patch Appearance: Markings consistent with prescribed medication Bottle Appearance: Standard pharmacy container. Clearly labeled. Filled Date: 5 / 24 / 2019 Last Medication intake:  Today

## 2017-08-31 NOTE — Telephone Encounter (Signed)
Dr. Janese Banks can give the results.  Dr. Janese Banks, however, does not treat renal masses until they have metastasize outside of the kidney under no longer curable.  If she can have any intervention for this mass, I would be the one to facilitate that.  If she is absolutely not going to have any treatment, she can just continue to follow with Dr. Janese Banks and be sent back here as needed if she elects to have any further intervention or would like to have more conversation about treatment.  Hollice Espy, MD

## 2017-08-31 NOTE — Telephone Encounter (Signed)
Per radiology scheduling dept: 5/21 Audrey Garrett called stating that she does not want this exam........................................................................................................................................ per pt wants to call provider before sched appt to see if she really needs to have done  Dr Erlene Quan - please advise.

## 2017-08-31 NOTE — Telephone Encounter (Signed)
The CT was done in January 2019 in error while patient was getting another CT. Dr Janese Banks has reordered the CT abdomen w wo along with a CT chest w to be done on 09/30/2017. Pt asks if she needs to see you for results or can Dr Janese Banks give the results?

## 2017-08-31 NOTE — Telephone Encounter (Signed)
Informed patient of Dr Cherrie Gauze message below. Pt plans to keep appointment with Dr Erlene Quan following CT.

## 2017-08-31 NOTE — Patient Instructions (Signed)
____________________________________________________________________________________________  Medication Rules  Applies to: All patients receiving prescriptions (written or electronic).  Pharmacy of record: Pharmacy where electronic prescriptions will be sent. If written prescriptions are taken to a different pharmacy, please inform the nursing staff. The pharmacy listed in the electronic medical record should be the one where you would like electronic prescriptions to be sent.  Prescription refills: Only during scheduled appointments. Applies to both, written and electronic prescriptions.  NOTE: The following applies primarily to controlled substances (Opioid* Pain Medications).   [name] responsibilities: 1. Pain Pills: Bring all pain pills to every appointment (except for procedure appointments). 2. Pill Bottles: Bring pills in original pharmacy bottle. Always bring newest bottle. Bring bottle, even if empty. 3. Medication refills: You are responsible for knowing and keeping track of what medications you need refilled. The day before your appointment, write a list of all prescriptions that need to be refilled. Bring that list to your appointment and give it to the admitting nurse. Prescriptions will be written only during appointments. If you forget a medication, it will not be "Called in", "Faxed", or "electronically sent". You will need to get another appointment to get these prescribed. 4. Prescription Accuracy: You are responsible for carefully inspecting your prescriptions before leaving our office. Have the discharge nurse carefully go over each prescription with you, before taking them home. Make sure that your name is accurately spelled, that your address is correct. Check the name and dose of your medication to make sure it is accurate. Check the number of pills, and the written instructions to make sure they are clear and accurate. Make sure that you are given enough medication to last  until your next medication refill appointment. 5. Taking Medication: Take medication as prescribed. Never take more pills than instructed. Never take medication more frequently than prescribed. Taking less pills or less frequently is permitted and encouraged, when it comes to controlled substances (written prescriptions).  6. Inform other Doctors: Always inform, all of your healthcare providers, of all the medications you take. 7. Pain Medication from other Providers: You are not allowed to accept any additional pain medication from any other Doctor or Healthcare provider. There are two exceptions to this rule. (see below) In the event that you require additional pain medication, you are responsible for notifying us, as stated below. 8. Medication Agreement: You are responsible for carefully reading and following our Medication Agreement. This must be signed before receiving any prescriptions from our practice. Safely store a copy of your signed Agreement. Violations to the Agreement will result in no further prescriptions. (Additional copies of our Medication Agreement are available upon request.) 9. Laws, Rules, & Regulations: All patients are expected to follow all Federal and State Laws, Statutes, Rules, & Regulations. Ignorance of the Laws does not constitute a valid excuse. The use of any illegal substances is prohibited. 10. Adopted CDC guidelines & recommendations: Target dosing levels will be at or below 60 MME/day. Use of benzodiazepines** is not recommended.  Exceptions: There are only two exceptions to the rule of not receiving pain medications from other Healthcare Providers. 1. Exception #1 (Emergencies): In the event of an emergency (i.e.: accident requiring emergency care), you are allowed to receive additional pain medication. However, you are responsible for: As soon as you are able, call our office [phone], at any time of the day or night, and leave a message stating your name, the  date and nature of the emergency, and the name and dose of the medication   prescribed. In the event that your call is answered by a member of our staff, make sure to document and save the date, time, and the name of the person that took your information.  2. Exception #2 (Planned Surgery): In the event that you are scheduled by another doctor or dentist to have any type of surgery or procedure, you are allowed (for a period no longer than 30 days), to receive additional pain medication, for the acute post-op pain. However, in this case, you are responsible for picking up a copy of our "Post-op Pain Management for Surgeons" handout, and giving it to your surgeon or dentist. This document is available at our office, and does not require an appointment to obtain it. Simply go to our office during business hours (Monday-Thursday from 8:00 AM to 4:00 PM) (Friday 8:00 AM to 12:00 Noon) or if you have a scheduled appointment with us, prior to your surgery, and ask for it by name. In addition, you will need to provide us with your name, name of your surgeon, type of surgery, and date of procedure or surgery.  *Opioid medications include: morphine, codeine, oxycodone, oxymorphone, hydrocodone, hydromorphone, meperidine, tramadol, tapentadol, buprenorphine, fentanyl, methadone. **Benzodiazepine medications include: diazepam (Valium), alprazolam (Xanax), clonazepam (Klonopine), lorazepam (Ativan), clorazepate (Tranxene), chlordiazepoxide (Librium), estazolam (Prosom), oxazepam (Serax), temazepam (Restoril), triazolam (Halcion) (Last updated: 05/26/2017) ____________________________________________________________________________________________    

## 2017-08-31 NOTE — Progress Notes (Signed)
[name] Name: Audrey Garrett  MRN: [id number]  Referring Provider: Jodi Marble, MD  DOB: 1960-09-15  PCP: Jodi Marble, MD  DOS: 08/31/2017  Note by: Vevelyn Francois NP  Service setting: Ambulatory outpatient  Specialty: Interventional Pain Management  Location: [facility] (AMB) Pain Management Facility    Patient type: Established    Primary Reason(s) for Visit: Encounter for prescription drug management. (Level of risk: moderate)  CC: Back Pain (lower) and Neck Pain  HPI  Audrey Garrett is a 57 y.o. year old, female patient, who comes today for a medication management evaluation. She has Insomnia, persistent; BP (high blood pressure); Cervical radicular pain; Uncomplicated opioid dependence ([place]); Long term prescription opiate use; Failed back surgical syndrome; Lumbar facet syndrome (Bilateral) (L>R); Osteoarthritis of spine with radiculopathy, lumbar region; Musculoskeletal pain; Chronic low back pain (Primary Source of Pain) (Bilateral) (L>R); HCV (hepatitis C virus); At risk for osteopenia; Lumbar spondylosis; Chronic lumbar radicular pain (Secondary source of pain) (Left); Chronic neck pain; Chronic lower extremity pain (Left); Chronic sacroiliac joint pain (Bilateral) (R>L); Chronic upper back pain; Long term current use of opiate analgesic; Encounter for chronic pain management; Hypomagnesemia (low magnesium levels); Hypotension; Anemia; Thrombocytopenia ([place]); Coagulopathy ([place]); Hyperglycemia; Polyneuropathy; Urinary retention; Difficulty urinating; Nephrolithiasis; Alcoholic cirrhosis of liver with ascites ([place]); Depression, major, recurrent, moderate ([place]); Abdominal pain, chronic, epigastric; Hepatic cirrhosis ([place]); Acute GI bleeding; Neurogenic pain; S/P thoracolumbar fusion (T5-L5); Grade 1 Retrolisthesis (4 mm) of C5 over C6 and C6 over C7; Cervical foraminal stenosis (Left C5-6; Bilateral C6-7); Hernia of anterior abdominal wall; Incarcerated umbilical hernia; Controlled type 2 diabetes  mellitus with hyperglycemia ([place]); Acute blood loss anemia; Chronic pain syndrome; Malnutrition of moderate degree; Iron deficiency anemia due to chronic blood loss; Opiate use; RLS (restless legs syndrome); Bilateral leg edema; Depression, major, in remission ([place]); Chronic hip pain (Bilateral); Osteoarthritis of hip (Bilateral); Personal history of tobacco use, presenting hazards to health; Chronic groin pain, left; Malignant neoplasm of upper lobe of right lung ([place]); Acute postoperative pain; Chronic hepatitis C without hepatic coma (HCC); and Cirrhosis of liver with ascites (HCC) on their problem list. Her primarily concern today is the Back Pain (lower) and Neck Pain  Pain Assessment: Location: Lower Back Radiating: up back around bra line left and right, radiates left higp and around to groin Onset: More than a month ago Duration: Chronic pain Quality: Aching, Dull, Discomfort Severity: 7 /10 (subjective, self-reported pain score)  Note: Reported level is compatible with observation. Clinically the patient looks like a 3/10 A 3/10 is viewed as "Moderate" and described as significantly interfering with activities of daily living (ADL). It becomes difficult to feed, bathe, get dressed, get on and off the toilet or to perform personal hygiene functions. Difficult to get in and out of bed or a chair without assistance. Very distracting. With effort, it can be ignored when deeply involved in activities. Information on the proper use of the pain scale provided to the patient today. When using our objective Pain Scale, levels between 6 and 10/10 are said to belong in an emergency room, as it progressively worsens from a 6/10, described as severely limiting, requiring emergency care not usually available at an outpatient pain management facility. At a 6/10 level, communication becomes difficult and requires great effort. Assistance to reach the emergency department may be required. Facial flushing and  profuse sweating along with potentially dangerous increases in heart rate and blood pressure will be evident. Effect on ADL: prolonged walking, standing, ADL's  Timing:   Modifying factors: sitting. medications BP: (!) 108/57  HR: 85  Audrey Garrett was last scheduled for an appointment on 08/29/2017 for medication management. During today's appointment we reviewed Audrey Garrett's chronic pain status, as well as her outpatient medication regimen. She states that she feels and notices that she leaning over more. She feels like this is putting a strain her neck. She states that other than that her back and hip pain is the same. She admits that she called the surgeon office and she does not want to go through surgery again. She states that she is scared. She admits that she is moving this weekend. She admits that she can not longer do stairs.   The patient  reports that she does not use drugs. Her body mass index is 22.24 kg/m.  Further details on both, my assessment(s), as well as the proposed treatment plan, please see below.  Controlled Substance Pharmacotherapy Assessment REMS (Risk Evaluation and Mitigation Strategy)  Analgesic:Oxycodone IR 20 mg every 6 (80 mg/day) MME/day:120 mg/day.   Garner, Cynthia F, RN  08/31/2017 11:12 AM  Sign at close encounter Nursing Pain Medication Assessment:  Safety precautions to be maintained throughout the outpatient stay will include: orient to surroundings, keep bed in low position, maintain call bell within reach at all times, provide assistance with transfer out of bed and ambulation.  Medication Inspection Compliance: Pill count conducted under aseptic conditions, in front of the patient. Neither the pills nor the bottle was removed from the patient's sight at any time. Once count was completed pills were immediately returned to the patient in their original bottle.  Medication: Oxycodone ER (OxyContin) Pill/Patch Count: 80 of 120 pills remain Pill/Patch  Appearance: Markings consistent with prescribed medication Bottle Appearance: Standard pharmacy container. Clearly labeled. Filled Date: 5 / 24 / 2019 Last Medication intake:  Today   Pharmacokinetics: Liberation and absorption (onset of action): WNL Distribution (time to peak effect): WNL Metabolism and excretion (duration of action): WNL         Pharmacodynamics: Desired effects: Analgesia: Audrey Garrett reports >50% benefit. Functional ability: Patient reports that medication allows her to accomplish basic ADLs Clinically meaningful improvement in function (CMIF): Sustained CMIF goals met Perceived effectiveness: Described as relatively effective, allowing for increase in activities of daily living (ADL) Undesirable effects: Side-effects or Adverse reactions: None reported Monitoring: [place] PMP: Online review of the past 12-month period conducted. Compliant with practice rules and regulations Last UDS on record: Summary  Date Value Ref Range Status  05/04/2017 FINAL  Final    Comment:    ==================================================================== TOXASSURE SELECT 13 (MW) ==================================================================== Test                             Result       Flag       Units Drug Present and Declared for Prescription Verification   Oxycodone                      5582         EXPECTED   ng/mg creat   Oxymorphone                    492          EXPECTED   ng/mg creat   Noroxycodone                   >9804          EXPECTED   ng/mg creat   Noroxymorphone                 307          EXPECTED   ng/mg creat    Sources of oxycodone are scheduled prescription medications.    Oxymorphone, noroxycodone, and noroxymorphone are expected    metabolites of oxycodone. Oxymorphone is also available as a    scheduled prescription medication. ==================================================================== Test                      Result    Flag   Units       Ref Range   Creatinine              102              mg/dL      >=20 ==================================================================== Declared Medications:  The flagging and interpretation on this report are based on the  following declared medications.  Unexpected results may arise from  inaccuracies in the declared medications.  **Note: The testing scope of this panel includes these medications:  Oxycodone  **Note: The testing scope of this panel does not include following  reported medications:  Furosemide (Lasix)  Lactulose  Linagliptin (Tradjenta)  Magnesium (Mag-Ox)  Metformin (Glucophage)  Mupirocin (Bactroban)  Pantoprazole (Protonix)  Pregabalin (Lyrica)  Spironolactone (Aldactone)  Vitamin B1 ==================================================================== For clinical consultation, please call [phone]. ====================================================================    UDS interpretation: Compliant          Medication Assessment Form: Reviewed. Patient indicates being compliant with therapy Treatment compliance: Compliant Risk Assessment Profile: Aberrant behavior: See prior evaluations. None observed or detected today Comorbid factors increasing risk of overdose: See prior notes. No additional risks detected today Risk of substance use disorder (SUD): Low Opioid Risk Tool - 08/31/17 1108      Family History of Substance Abuse   Alcohol  Negative    Illegal Drugs  Negative    Rx Drugs  Negative      Personal History of Substance Abuse   Alcohol  Negative    Illegal Drugs  Negative    Rx Drugs  Negative      Psychological Disease   Psychological Disease  Negative    Depression  Positive      Total Score   Opioid Risk Tool Scoring  1    Opioid Risk Interpretation  Low Risk      ORT Scoring interpretation table:  Score <3 = Low Risk for SUD  Score between 4-7 = Moderate Risk for SUD  Score >8 = High Risk for Opioid Abuse   Risk  Mitigation Strategies:  Patient Counseling: Covered Patient-Prescriber Agreement (PPA): Present and active  Notification to other healthcare providers: Done  Pharmacologic Plan: No change in therapy, at this time.             Laboratory Chemistry  Inflammation Markers (CRP: Acute Phase) (ESR: Chronic Phase) Lab Results  Component Value Date   CRP <0.5 04/03/2015   ESRSEDRATE 27 04/03/2015   LATICACIDVEN 2.7 (HH) 04/23/2017                         Rheumatology Markers No results found for: RF, ANA, LABURIC, URICUR, LYMEIGGIGMAB, LYMEABIGMQN, HLAB27                      Renal Function Markers Lab Results  Component Value Date     BUN 11 04/23/2017   CREATININE 0.72 04/23/2017   GFRAA >60 04/23/2017   GFRNONAA >60 04/23/2017                              Hepatic Function Markers Lab Results  Component Value Date   AST 68 (H) 04/23/2017   ALT 35 04/23/2017   ALBUMIN 4.2 04/23/2017   ALKPHOS 90 04/23/2017   LIPASE 32 11/06/2015   AMMONIA 12 09/14/2016                        Electrolytes Lab Results  Component Value Date   NA 130 (L) 04/23/2017   K 4.0 04/23/2017   CL 93 (L) 04/23/2017   CALCIUM 8.8 (L) 04/23/2017   MG 1.9 01/22/2016   PHOS 3.6 11/23/2013                        Neuropathy Markers Lab Results  Component Value Date   VITAMINB12 868 06/27/2017   FOLATE 19.8 06/27/2017   HGBA1C 5.3 05/22/2015                        Bone Pathology Markers No results found for: VD25OH, VD125OH2TOT, VD3125OH2, VD2125OH2, 25OHVITD1, 25OHVITD2, 25OHVITD3, TESTOFREE, TESTOSTERONE                       Coagulation Parameters Lab Results  Component Value Date   INR 1.16 08/24/2017   LABPROT 14.7 08/24/2017   APTT 35 08/24/2017   PLT 151 08/24/2017                        Cardiovascular Markers Lab Results  Component Value Date   BNP 929 (H) 09/23/2013   CKTOTAL 1,597 (H) 12/17/2012   TROPONINI <0.03 05/07/2015   HGB 12.7 08/24/2017   HCT 36.3 08/24/2017                          CA Markers No results found for: CEA, CA125, LABCA2                      Note: Lab results reviewed.  Recent Diagnostic Imaging Results  DG Abd 2 Views CLINICAL DATA:  Bilateral lower abdominal pain for the past week. Upper GI performed on 06/13/2017.  EXAM: ABDOMEN - 2 VIEW  COMPARISON:  Upper GI series dated 06/13/2017 and chest CT dated 11/01/2016.  FINDINGS: Normal bowel gas pattern with barium scattered throughout the colon and in the appendix. Extensive fixation hardware is again noted in the thoracic and lumbar spine and bilateral pelvis. Linear atelectasis or scarring in the right mid lung zone.  IMPRESSION: No acute abnormality.  Electronically Signed   By: Steven  Reid M.D.   On: 06/16/2017 10:35  Complexity Note: Imaging results reviewed. Results shared with Audrey Garrett, using Layman's terms.                         Meds   Current Outpatient Medications:  .  furosemide (LASIX) 40 MG tablet, Take 40 mg by mouth 2 (two) times daily. , Disp: , Rfl:  .  lactulose (CHRONULAC) 10 GM/15ML solution, Take 10 g by mouth 3 (three) times daily as needed for mild constipation or moderate constipation. , Disp: ,   Rfl:  .  LYRICA 150 MG capsule, Take 150 mg by mouth 2 (two) times daily., Disp: , Rfl: 2 .  magnesium oxide (MAG-OX) 400 MG tablet, Take 400 mg by mouth daily., Disp: , Rfl:  .  metFORMIN (GLUCOPHAGE) 500 MG tablet, Take 500 mg by mouth 3 (three) times daily after meals., Disp: , Rfl:  .  Multiple Vitamins-Minerals (CENTRUM SILVER 50+WOMEN PO), Take 1 tablet by mouth daily., Disp: , Rfl:  .  [START ON 11/17/2017] Oxycodone HCl 20 MG TABS, Take 1 tablet (20 mg total) by mouth every 6 (six) hours., Disp: 120 tablet, Rfl: 0 .  pantoprazole (PROTONIX) 40 MG tablet, Take 40 mg by mouth twice daily., Disp: , Rfl: 11 .  PARoxetine (PAXIL) 40 MG tablet, Take 40 mg by mouth every morning., Disp: , Rfl: 2 .  spironolactone (ALDACTONE) 50 MG tablet, TAKE 2  TABLETS BY MOUTH EVERY DAY, Disp: , Rfl:  .  thiamine (VITAMIN B-1) 100 MG tablet, Take 1 tablet (100 mg total) by mouth daily., Disp: 90 tablet, Rfl: 6 .  TRADJENTA 5 MG TABS tablet, Take 5 mg by mouth daily., Disp: , Rfl: 2 .  [START ON 10/18/2017] Oxycodone HCl 20 MG TABS, Take 1 tablet (20 mg total) by mouth every 6 (six) hours., Disp: 120 tablet, Rfl: 0 .  [START ON 09/18/2017] Oxycodone HCl 20 MG TABS, Take 1 tablet (20 mg total) by mouth every 6 (six) hours as needed., Disp: 120 tablet, Rfl: 0  ROS  Constitutional: Denies any fever or chills Gastrointestinal: No reported hemesis, hematochezia, vomiting, or acute GI distress Musculoskeletal: Denies any acute onset joint swelling, redness, loss of ROM, or weakness Neurological: No reported episodes of acute onset apraxia, aphasia, dysarthria, agnosia, amnesia, paralysis, loss of coordination, or loss of consciousness  Allergies  Audrey Garrett has No Known Allergies.  PFSH  Drug: Audrey Garrett  reports that she does not use drugs. Alcohol:  reports that she does not drink alcohol. Tobacco:  reports that she has been smoking cigarettes.  She has a 42.00 pack-year smoking history. She has never used smokeless tobacco. Medical:  has a past medical history of Alcohol abuse, Alcoholic cirrhosis of liver without ascites ([place]), Anemia, Anxiety, Arthropathy, Back pain, Bronchitis, BRONCHITIS, ACUTE (11/01/2009), Chronic hepatitis C ([place]), Chronic LBP (12/25/2014), Cirrhosis ([place]), DDD (degenerative disc disease), lumbar (01/01/2015), Depression, Diabetes mellitus without complication ([place]), Diverticulosis, Edema, Fatigue, Fibromyalgia, GERD (gastroesophageal reflux disease), High risk medications (not anticoagulants) long-term use, Hyperglycemia, Hypertension, Kidney mass, Left leg pain (01/01/2015), Lumbago, Lumbar radicular pain (01/01/2015), Muscle spasm, Neck pain (01/01/2015), Polyarthritis, Reflux, Restless leg syndrome, RLS (restless legs syndrome),  Sacroiliac pain (01/01/2015), Shoulder pain, Sinusitis, SOB (shortness of breath), Spine disorder, Stress headaches, Upper back pain (01/01/2015), Urinary frequency, and Vitamin D deficiency disease. Surgical: Audrey Garrett  has a past surgical history that includes Tubal ligation; Tonsillectomy; Back surgery (12/19/2011); Esophagogastroduodenoscopy (N/A, 04/14/2015); Esophagogastroduodenoscopy (egd) with propofol (N/A, 09/16/2015); Colonoscopy with propofol (N/A, 09/25/2015); Umbilical hernia repair (N/A, 11/06/2015); Hernia repair (N/A, 10/2015); Esophagogastroduodenoscopy (egd) with propofol (N/A, 01/22/2016); Back surgery; Colonoscopy with propofol (N/A, 04/01/2016); Esophagogastroduodenoscopy (egd) with propofol (N/A, 04/01/2016); Electormagnetic navigation bronchoscopy (N/A, 11/25/2016); and IR Radiologist Eval & Mgmt (04/13/2017). Family: family history includes [name] in her father; Breast cancer (age of onset: 37) in her sister; Heart disease in her mother; Stroke in her mother.  Constitutional Exam  General appearance: Well nourished, well developed, and well hydrated. In no apparent acute distress Vitals:   08/31/17 1056  BP: Marland Kitchen)  108/57  Pulse: 85  Resp: 18  Temp: 98.2 F (36.8 C)  SpO2: 100%  Weight: 155 lb (70.3 kg)  Height: 5' 10" (1.778 m)  Psych/Mental status: Alert, oriented x 3 (person, place, & time)       Eyes: PERLA Respiratory: No evidence of acute respiratory distress  Cervical Spine Area Exam  Skin & Axial Inspection: No masses, redness, edema, swelling, or associated skin lesions Alignment: Symmetrical Functional ROM: Decreased ROM      Stability: No instability detected Muscle Tone/Strength: Functionally intact. No obvious neuro-muscular anomalies detected. Sensory (Neurological): Unimpaired Palpation: No palpable anomalies              Upper Extremity (UE) Exam    Side: Right upper extremity  Side: Left upper extremity  Skin & Extremity Inspection: Skin color, temperature,  and hair growth are WNL. No peripheral edema or cyanosis. No masses, redness, swelling, asymmetry, or associated skin lesions. No contractures.  Skin & Extremity Inspection: Skin color, temperature, and hair growth are WNL. No peripheral edema or cyanosis. No masses, redness, swelling, asymmetry, or associated skin lesions. No contractures.  Functional ROM: Unrestricted ROM          Functional ROM: Unrestricted ROM          Muscle Tone/Strength: Functionally intact. No obvious neuro-muscular anomalies detected.  Muscle Tone/Strength: Functionally intact. No obvious neuro-muscular anomalies detected.  Sensory (Neurological): Unimpaired          Sensory (Neurological): Unimpaired          Palpation: No palpable anomalies              Palpation: No palpable anomalies              Provocative Test(s):  Phalen's test: deferred Tinel's test: deferred Apley's scratch test (touch opposite shoulder):  Action 1 (Across chest): deferred Action 2 (Overhead): deferred Action 3 (LB reach): deferred   Provocative Test(s):  Phalen's test: deferred Tinel's test: deferred Apley's scratch test (touch opposite shoulder):  Action 1 (Across chest): deferred Action 2 (Overhead): deferred Action 3 (LB reach): deferred    Thoracic Spine Area Exam  Skin & Axial Inspection: Well healed scar from previous spine surgery detected Alignment: Symmetrical Functional ROM: Unrestricted ROM Stability: No instability detected Muscle Tone/Strength: Functionally intact. No obvious neuro-muscular anomalies detected. Sensory (Neurological): Unimpaired Muscle strength & Tone: No palpable anomalies  Lumbar Spine Area Exam  Skin & Axial Inspection: Well healed scar from previous spine surgery detected Alignment: Symmetrical Functional ROM: Unrestricted ROM       Stability: No instability detected Muscle Tone/Strength: Functionally intact. No obvious neuro-muscular anomalies detected. Sensory (Neurological):  Unimpaired Palpation: No palpable anomalies       Provocative Tests: Lumbar Hyperextension/rotation test: deferred today       Lumbar quadrant test (Kemp's test): deferred today       Lumbar Lateral bending test: deferred today       Patrick's Maneuver: deferred today                   FABER test: deferred today       Thigh-thrust test: deferred today       S-I compression test: deferred today       S-I distraction test: deferred today        Gait & Posture Assessment  Ambulation: Unassisted Gait: Relatively normal for age and body habitus Posture: WNL   Lower Extremity Exam    Side: Right lower extremity  Side: Left   lower extremity  Stability: No instability observed          Stability: No instability observed          Skin & Extremity Inspection: Skin color, temperature, and hair growth are WNL. No peripheral edema or cyanosis. No masses, redness, swelling, asymmetry, or associated skin lesions. No contractures.  Skin & Extremity Inspection: Skin color, temperature, and hair growth are WNL. No peripheral edema or cyanosis. No masses, redness, swelling, asymmetry, or associated skin lesions. No contractures.  Functional ROM: Unrestricted ROM                  Functional ROM: Unrestricted ROM                  Muscle Tone/Strength: Functionally intact. No obvious neuro-muscular anomalies detected.  Muscle Tone/Strength: Functionally intact. No obvious neuro-muscular anomalies detected.  Sensory (Neurological): Unimpaired  Sensory (Neurological): Unimpaired  Palpation: No palpable anomalies  Palpation: No palpable anomalies   Assessment  Primary Diagnosis & Pertinent Problem List: The primary encounter diagnosis was Lumbar spondylosis. Diagnoses of Chronic upper back pain, Chronic neck pain, Chronic pain syndrome, and Cervical foraminal stenosis (Left C5-6; Bilateral C6-7) were also pertinent to this visit.  Status Diagnosis  Persistent Persistent Deteriorating 1. Lumbar spondylosis    2. Chronic upper back pain   3. Chronic neck pain   4. Chronic pain syndrome   5. Cervical foraminal stenosis (Left C5-6; Bilateral C6-7)     Problems updated and reviewed during this visit: No problems updated. Plan of Care  Pharmacotherapy (Medications Ordered): Meds ordered this encounter  Medications  . Oxycodone HCl 20 MG TABS    Sig: Take 1 tablet (20 mg total) by mouth every 6 (six) hours.    Dispense:  120 tablet    Refill:  0    Fill one day early if pharmacy is closed on scheduled refill date. Do not fill until:11/17/2017 To last until:12/17/2017    Order Specific Question:   Supervising Provider    Answer:   [name] [phone]  . Oxycodone HCl 20 MG TABS    Sig: Take 1 tablet (20 mg total) by mouth every 6 (six) hours.    Dispense:  120 tablet    Refill:  0    Do not add this medication to the electronic "Automatic Refill" notification system. Patient may have prescription filled one day early if pharmacy is closed on scheduled refill date. Do not fill until:10/18/2017 To last until: 11/17/2017    Order Specific Question:   Supervising Provider    Answer:   [name] [phone]  . Oxycodone HCl 20 MG TABS    Sig: Take 1 tablet (20 mg total) by mouth every 6 (six) hours as needed.    Dispense:  120 tablet    Refill:  0    Patient may have prescription filled one day early if pharmacy is closed on scheduled refill date. Do not fill until:09/18/2017 To last until: 10/18/2017    Order Specific Question:   Supervising Provider    Answer:   [name] [phone]   New Prescriptions   No medications on file   Medications administered today: Audrey Garrett had no medications administered during this visit. Lab-work, procedure(s), and/or referral(s): No orders of the defined types were placed in this encounter.  Imaging and/or referral(s): None  Interventional therapies: Planned, scheduled, and/or pending: Not at this time   Considering:   Diagnostic bilateral lumbar facetblock  Possible bilateral lumbar  facet RFA Diagnostic left caudal Epiduralsteroid injection + diagnostic epidurogram Possible Racz procedure Diagnostic bilateral sacroiliacjoint injection  Possible bilateral sacroiliac joint RFA Diagnostic left cervical epiduralsteroid injection  Diagnostic bilateral cervical facetblock  Possible bilateral cervical facet RFA   Palliative PRN treatment(s):  Diagnostic bilateral lumbar facet blockunder fluoroscopic guidance and IV sedation  Diagnostic bilateral intra-articular hipjoint injection under fluoroscopic guidance and IV sedation  Diagnostic bilateral sacroiliacjoint block under fluoroscopic guidance and IV sedation    Provider-requested follow-up: Return in about 3 months (around 12/01/2017) for MedMgmt with Me Donella Stade Edison Pace).  Future Appointments  Date Time Provider [place]  09/13/2017  9:15 AM Hollice Espy, MD [facility] None  09/28/2017 11:15 AM [facility] None  09/30/2017 11:00 AM [facility]  10/04/2017 10:30 AM Sindy Guadeloupe, MD [facility] None  10/04/2017 11:00 AM [facility] 2 [facility] None  12/01/2017 10:30 AM Vevelyn Francois, NP [facility] None   Primary Care Physician: Jodi Marble, MD Location: [facility] Note by: Vevelyn Francois NP Date: 08/31/2017; Time: 1:13 PM  Pain Score Disclaimer: We use the NRS-11 scale. This is a self-reported, subjective measurement of pain severity with only modest accuracy. It is used primarily to identify changes within a particular patient. It must be understood that outpatient pain scales are significantly less accurate that those used for research, where they can be applied under ideal controlled circumstances with minimal exposure to variables. In reality, the score is likely to be a combination of pain intensity and pain affect, where pain affect describes the degree of  emotional arousal or changes in action readiness caused by the sensory experience of pain. Factors such as social and work situation, setting, emotional state, anxiety levels, expectation, and prior pain experience may influence pain perception and show large inter-individual differences that may also be affected by time variables.  Patient instructions provided during this appointment: Patient Instructions  ____________________________________________________________________________________________  Medication Rules  Applies to: All patients receiving prescriptions (written or electronic).  Pharmacy of record: Pharmacy where electronic prescriptions will be sent. If written prescriptions are taken to a different pharmacy, please inform the nursing staff. The pharmacy listed in the electronic medical record should be the one where you would like electronic prescriptions to be sent.  Prescription refills: Only during scheduled appointments. Applies to both, written and electronic prescriptions.  NOTE: The following applies primarily to controlled substances (Opioid* Pain Medications).   [name] responsibilities: 1. Pain Pills: Bring all pain pills to every appointment (except for procedure appointments). 2. Pill Bottles: Bring pills in original pharmacy bottle. Always bring newest bottle. Bring bottle, even if empty. 3. Medication refills: You are responsible for knowing and keeping track of what medications you need refilled. The day before your appointment, write a list of all prescriptions that need to be refilled. Bring that list to your appointment and give it to the admitting nurse. Prescriptions will be written only during appointments. If you forget a medication, it will not be "Called in", "Faxed", or "electronically sent". You will need to get another appointment to get these prescribed. 4. Prescription Accuracy: You are responsible for carefully inspecting your prescriptions before  leaving our office. Have the discharge nurse carefully go over each prescription with you, before taking them home. Make sure that your name is accurately spelled, that your address is correct. Check the name and dose of your medication to make sure it is accurate. Check the number of pills, and the written  instructions to make sure they are clear and accurate. Make sure that you are given enough medication to last until your next medication refill appointment. 5. Taking Medication: Take medication as prescribed. Never take more pills than instructed. Never take medication more frequently than prescribed. Taking less pills or less frequently is permitted and encouraged, when it comes to controlled substances (written prescriptions).  6. Inform other Doctors: Always inform, all of your healthcare providers, of all the medications you take. 7. Pain Medication from other Providers: You are not allowed to accept any additional pain medication from any other Doctor or Healthcare provider. There are two exceptions to this rule. (see below) In the event that you require additional pain medication, you are responsible for notifying us, as stated below. 8. Medication Agreement: You are responsible for carefully reading and following our Medication Agreement. This must be signed before receiving any prescriptions from our practice. Safely store a copy of your signed Agreement. Violations to the Agreement will result in no further prescriptions. (Additional copies of our Medication Agreement are available upon request.) 9. Laws, Rules, & Regulations: All patients are expected to follow all Federal and State Laws, Statutes, Rules, & Regulations. Ignorance of the Laws does not constitute a valid excuse. The use of any illegal substances is prohibited. 10. Adopted CDC guidelines & recommendations: Target dosing levels will be at or below 60 MME/day. Use of benzodiazepines** is not recommended.  Exceptions: There are only  two exceptions to the rule of not receiving pain medications from other Healthcare Providers. 1. Exception #1 (Emergencies): In the event of an emergency (i.e.: accident requiring emergency care), you are allowed to receive additional pain medication. However, you are responsible for: As soon as you are able, call our office [phone], at any time of the day or night, and leave a message stating your name, the date and nature of the emergency, and the name and dose of the medication prescribed. In the event that your call is answered by a member of our staff, make sure to document and save the date, time, and the name of the person that took your information.  2. Exception #2 (Planned Surgery): In the event that you are scheduled by another doctor or dentist to have any type of surgery or procedure, you are allowed (for a period no longer than 30 days), to receive additional pain medication, for the acute post-op pain. However, in this case, you are responsible for picking up a copy of our "Post-op Pain Management for Surgeons" handout, and giving it to your surgeon or dentist. This document is available at our office, and does not require an appointment to obtain it. Simply go to our office during business hours (Monday-Thursday from 8:00 AM to 4:00 PM) (Friday 8:00 AM to 12:00 Noon) or if you have a scheduled appointment with us, prior to your surgery, and ask for it by name. In addition, you will need to provide us with your name, name of your surgeon, type of surgery, and date of procedure or surgery.  *Opioid medications include: morphine, codeine, oxycodone, oxymorphone, hydrocodone, hydromorphone, meperidine, tramadol, tapentadol, buprenorphine, fentanyl, methadone. **Benzodiazepine medications include: diazepam (Valium), alprazolam (Xanax), clonazepam (Klonopine), lorazepam (Ativan), clorazepate (Tranxene), chlordiazepoxide (Librium), estazolam (Prosom), oxazepam (Serax), temazepam (Restoril),  triazolam (Halcion) (Last updated: 05/26/2017) ____________________________________________________________________________________________       

## 2017-08-31 NOTE — Telephone Encounter (Signed)
She definitely needs the study.  She has an enlarging renal mass which we need to keep an eye on.  Please call her and find out what her issues are.  Hollice Espy, MD

## 2017-09-09 ENCOUNTER — Ambulatory Visit: Payer: Self-pay | Admitting: Urology

## 2017-09-13 ENCOUNTER — Ambulatory Visit: Payer: Self-pay | Admitting: Urology

## 2017-09-21 ENCOUNTER — Other Ambulatory Visit (HOSPITAL_COMMUNITY): Payer: Self-pay | Admitting: Diagnostic Radiology

## 2017-09-21 DIAGNOSIS — N2889 Other specified disorders of kidney and ureter: Secondary | ICD-10-CM

## 2017-09-28 ENCOUNTER — Other Ambulatory Visit: Payer: Self-pay

## 2017-09-28 ENCOUNTER — Inpatient Hospital Stay: Payer: Medicare Other | Attending: Oncology

## 2017-09-28 DIAGNOSIS — F17210 Nicotine dependence, cigarettes, uncomplicated: Secondary | ICD-10-CM | POA: Diagnosis not present

## 2017-09-28 DIAGNOSIS — D500 Iron deficiency anemia secondary to blood loss (chronic): Secondary | ICD-10-CM

## 2017-09-28 DIAGNOSIS — C649 Malignant neoplasm of unspecified kidney, except renal pelvis: Secondary | ICD-10-CM | POA: Diagnosis not present

## 2017-09-28 DIAGNOSIS — C3411 Malignant neoplasm of upper lobe, right bronchus or lung: Secondary | ICD-10-CM | POA: Diagnosis not present

## 2017-09-28 DIAGNOSIS — D539 Nutritional anemia, unspecified: Secondary | ICD-10-CM | POA: Insufficient documentation

## 2017-09-28 DIAGNOSIS — R238 Other skin changes: Secondary | ICD-10-CM | POA: Diagnosis not present

## 2017-09-28 DIAGNOSIS — K130 Diseases of lips: Secondary | ICD-10-CM | POA: Diagnosis not present

## 2017-09-28 LAB — FERRITIN: Ferritin: 92 ng/mL (ref 11–307)

## 2017-09-28 LAB — IRON AND TIBC
Iron: 96 ug/dL (ref 28–170)
Saturation Ratios: 38 % — ABNORMAL HIGH (ref 10.4–31.8)
TIBC: 251 ug/dL (ref 250–450)
UIBC: 155 ug/dL

## 2017-09-28 LAB — CBC WITH DIFFERENTIAL/PLATELET
Basophils Absolute: 0.1 10*3/uL (ref 0–0.1)
Basophils Relative: 1 %
Eosinophils Absolute: 0.1 10*3/uL (ref 0–0.7)
Eosinophils Relative: 1 %
HCT: 36.5 % (ref 35.0–47.0)
Hemoglobin: 12.9 g/dL (ref 12.0–16.0)
Lymphocytes Relative: 12 %
Lymphs Abs: 1.2 10*3/uL (ref 1.0–3.6)
MCH: 37.5 pg — ABNORMAL HIGH (ref 26.0–34.0)
MCHC: 35.2 g/dL (ref 32.0–36.0)
MCV: 106.5 fL — ABNORMAL HIGH (ref 80.0–100.0)
Monocytes Absolute: 0.9 10*3/uL (ref 0.2–0.9)
Monocytes Relative: 9 %
Neutro Abs: 7.8 10*3/uL — ABNORMAL HIGH (ref 1.4–6.5)
Neutrophils Relative %: 77 %
Platelets: 174 10*3/uL (ref 150–440)
RBC: 3.43 MIL/uL — ABNORMAL LOW (ref 3.80–5.20)
RDW: 13.2 % (ref 11.5–14.5)
WBC: 10.1 10*3/uL (ref 3.6–11.0)

## 2017-09-30 ENCOUNTER — Ambulatory Visit
Admission: RE | Admit: 2017-09-30 | Discharge: 2017-09-30 | Disposition: A | Discharge status: Home / Self Care | Payer: Medicare Other | Source: Ambulatory Visit | Attending: Oncology | Admitting: Oncology

## 2017-09-30 DIAGNOSIS — N200 Calculus of kidney: Secondary | ICD-10-CM | POA: Diagnosis not present

## 2017-09-30 DIAGNOSIS — K760 Fatty (change of) liver, not elsewhere classified: Secondary | ICD-10-CM | POA: Insufficient documentation

## 2017-09-30 DIAGNOSIS — K8020 Calculus of gallbladder without cholecystitis without obstruction: Secondary | ICD-10-CM | POA: Insufficient documentation

## 2017-09-30 DIAGNOSIS — J432 Centrilobular emphysema: Secondary | ICD-10-CM | POA: Diagnosis not present

## 2017-09-30 DIAGNOSIS — C3411 Malignant neoplasm of upper lobe, right bronchus or lung: Secondary | ICD-10-CM | POA: Diagnosis not present

## 2017-09-30 DIAGNOSIS — N2889 Other specified disorders of kidney and ureter: Secondary | ICD-10-CM | POA: Diagnosis not present

## 2017-09-30 DIAGNOSIS — K429 Umbilical hernia without obstruction or gangrene: Secondary | ICD-10-CM | POA: Insufficient documentation

## 2017-09-30 DIAGNOSIS — I700 Atherosclerosis of aorta: Secondary | ICD-10-CM | POA: Diagnosis not present

## 2017-09-30 DIAGNOSIS — R918 Other nonspecific abnormal finding of lung field: Secondary | ICD-10-CM | POA: Diagnosis not present

## 2017-09-30 DIAGNOSIS — R932 Abnormal findings on diagnostic imaging of liver and biliary tract: Secondary | ICD-10-CM | POA: Diagnosis not present

## 2017-09-30 LAB — POCT I-STAT CREATININE: Creatinine, Ser: 0.6 mg/dL (ref 0.44–1.00)

## 2017-09-30 MED ORDER — IOHEXOL 300 MG/ML  SOLN
100.0000 mL | Freq: Once | INTRAMUSCULAR | Status: AC | PRN
  Administered 2017-09-30: 100 mL via INTRAVENOUS

## 2017-10-03 ENCOUNTER — Ambulatory Visit: Payer: Medicare Other

## 2017-10-04 ENCOUNTER — Inpatient Hospital Stay: Payer: Medicare Other

## 2017-10-04 ENCOUNTER — Other Ambulatory Visit: Payer: Self-pay

## 2017-10-04 ENCOUNTER — Inpatient Hospital Stay (HOSPITAL_BASED_OUTPATIENT_CLINIC_OR_DEPARTMENT_OTHER): Payer: Medicare Other | Admitting: Oncology

## 2017-10-04 ENCOUNTER — Encounter: Payer: Self-pay | Admitting: Oncology

## 2017-10-04 VITALS — BP 110/78 | HR 100 | Temp 98.5°F | Resp 16 | Ht 70.0 in | Wt 169.2 lb

## 2017-10-04 DIAGNOSIS — R238 Other skin changes: Secondary | ICD-10-CM

## 2017-10-04 DIAGNOSIS — F17210 Nicotine dependence, cigarettes, uncomplicated: Secondary | ICD-10-CM | POA: Diagnosis not present

## 2017-10-04 DIAGNOSIS — K130 Diseases of lips: Secondary | ICD-10-CM | POA: Diagnosis not present

## 2017-10-04 DIAGNOSIS — D539 Nutritional anemia, unspecified: Secondary | ICD-10-CM

## 2017-10-04 DIAGNOSIS — R233 Spontaneous ecchymoses: Secondary | ICD-10-CM

## 2017-10-04 DIAGNOSIS — C3411 Malignant neoplasm of upper lobe, right bronchus or lung: Secondary | ICD-10-CM

## 2017-10-04 DIAGNOSIS — C649 Malignant neoplasm of unspecified kidney, except renal pelvis: Secondary | ICD-10-CM

## 2017-10-04 LAB — VITAMIN B12: Vitamin B-12: 676 pg/mL (ref 180–914)

## 2017-10-04 LAB — FOLATE: Folate: 7.8 ng/mL (ref >5.9)

## 2017-10-04 MED ORDER — TRIAMCINOLONE ACETONIDE 0.1 % EX CREA: 1.0000 "application " | TOPICAL_CREAM | Freq: Two times a day (BID) | CUTANEOUS | 0 refills | Status: AC

## 2017-10-04 NOTE — Progress Notes (Signed)
Hematology/Oncology Consult note [facility]  Telephone:([phone] [phone]  Patient Care Team: Jodi Marble, MD as PCP - General (Internal Medicine)   Name of the patient: Audrey Garrett  [id number]  1960-08-20   Date of visit: 10/04/17  Diagnosis-  1. Iron deficiency anemia  2. Renal cell carcinoma  3. Stage I lung cancer [id number]    Chief complaint/ Reason for visit-discuss blood work and CT scan results  Heme/Onc history: Patient is a 57 year old female with a h/o liver cirrhosis and is currently receiving Harvoni for hepatitis C. GI evaluation is as follows:  Upper EGD on 04/01/16 showed multiple dispersed diminutive erosions were found in the gastric antrum. There were no stigmata of recent bleeding. One healing cratered gastric ulcer with no stigmata of bleeding was found in the prepyloric region of the stomach. The lesion was 2 mm in largest dimension. Colonoscopy on 04/01/16 showed multiple small-mouthed diverticula were found in the sigmoid colon and descending colon. A single small localized angioectasia with typical arborization was found in the proximal ascending colon. Coagulation for tissue destruction using argon plasma at 0.5 liters/minute and 20 watts was successful.  She has received significant blood transfusions recently since March 2017. Her last blood transfusion was on 03/17/16. She had 2 units of blood She received 4 doses of IV Venofer  In Jan 2018 she received blood trasnsfusion followed by ferraheme.   During her visit in February 2018 her H&H was 10.3/ 31.6. IV iron was held off and she was supposed to get cbc checked in1 month. Patient missed that appointment and became progressively iron deficient again when her hb dropped to 7.9. She received 1 unit of blood transfusion  Patient continued to feel fatigued despite improvement in her anemia. Given her strong h/o smoking CT lung cancer screenign was obtained  which showed: IMPRESSION: 11.6 mm spiculated right lung nodule just anterior to the major fissure with associated fissure all retraction. Lung-RADS Category 4A, suspicious. Follow up low-dose chest CT without contrast in 3 months (please use the following order, "CT CHEST LCS NODULE FOLLOW-UP W/O CM") is recommended. Alternatively, PET may be considered when there is a solid component 50mm or larger.  Other bilateral pulmonary nodules evident. Attention on follow-up.   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 She did not wish to pursue surgery  She sees DR. Erlene Quan for her renal mass. She is not a candidate for surgery and did not desire cryoablation.    Interval history- reports chronic fatigue.  She has developed sores in the angles of her mouth for the last 2 weeks which has been bothering her.  Also reports easy bruising in her forearms and bilateral lower extremities  ECOG PS- 1 Pain scale- 0 Opioid associated constipation- no  Review of systems- Review of Systems  Constitutional: Positive for malaise/fatigue. Negative for chills, fever and weight loss.  HENT: Negative for congestion, ear discharge and nosebleeds.   Eyes: Negative for blurred vision.  Respiratory: Negative for cough, hemoptysis, sputum production, shortness of breath and wheezing.   Cardiovascular: Negative for chest pain, palpitations, orthopnea and claudication.  Gastrointestinal: Negative for abdominal pain, blood in stool, constipation, diarrhea, heartburn, melena, nausea and vomiting.  Genitourinary: Negative for dysuria, flank pain, frequency, hematuria and urgency.  Musculoskeletal: Negative for back pain, joint pain and myalgias.  Skin: Positive for rash.  Neurological: Negative for dizziness, tingling, focal weakness, seizures, weakness and headaches.  Endo/Heme/Allergies:  Bruises/bleeds easily.  Psychiatric/Behavioral: Negative for depression and suicidal ideas. The patient does not have insomnia.      No Known Allergies   Past Medical History:  Diagnosis Date  . Alcohol abuse   . Alcoholic cirrhosis of liver without ascites ([place])   . Anemia   . Anxiety   . Arthropathy   . Back pain   . Bronchitis   . BRONCHITIS, ACUTE 11/01/2009   Qualifier: Diagnosis of  By: Alveta Heimlich MD, Cornelia Copa    . Chronic hepatitis C ([place])   . Chronic LBP 12/25/2014   Overview:  Post extensive surgery   . Cirrhosis ([place])   . DDD (degenerative disc disease), lumbar 01/01/2015  . Depression   . Diabetes mellitus without complication ([place])   . Diverticulosis   . Edema   . Fatigue   . Fibromyalgia   . GERD (gastroesophageal reflux disease)    gastroparesis  . High risk medications (not anticoagulants) long-term use   . Hyperglycemia   . Hypertension   . Kidney mass    11/17 small mass-  . Left leg pain 01/01/2015  . Lumbago   . Lumbar radicular pain 01/01/2015  . Muscle spasm   . Neck pain 01/01/2015  . Polyarthritis   . Reflux   . Restless leg syndrome    01/2016  . RLS (restless legs syndrome)   . Sacroiliac pain 01/01/2015  . Shoulder pain   . Sinusitis   . SOB (shortness of breath)   . Spine disorder   . Stress headaches   . Upper back pain 01/01/2015  . Urinary frequency   . Vitamin D deficiency disease      Past Surgical History:  Procedure Laterality Date  . BACK SURGERY  12/19/2011  . BACK SURGERY    . COLONOSCOPY WITH PROPOFOL N/A 09/25/2015   Procedure: COLONOSCOPY WITH PROPOFOL;  Surgeon: Lollie Sails, MD;  Location: [facility] ENDOSCOPY;  Service: Endoscopy;  Laterality: N/A;  . COLONOSCOPY WITH PROPOFOL N/A 04/01/2016   Procedure: COLONOSCOPY WITH PROPOFOL;  Surgeon: Lollie Sails, MD;  Location: [facility] ENDOSCOPY;  Service: Endoscopy;  Laterality: N/A;  . ELECTROMAGNETIC NAVIGATION BROCHOSCOPY N/A 11/25/2016   Procedure: ELECTROMAGNETIC NAVIGATION  BRONCHOSCOPY;  Surgeon: Flora Lipps, MD;  Location: [facility] ORS;  Service: Cardiopulmonary;  Laterality: N/A;  . ESOPHAGOGASTRODUODENOSCOPY N/A 04/14/2015   Procedure: ESOPHAGOGASTRODUODENOSCOPY (EGD);  Surgeon: Lollie Sails, MD;  Location: [facility] ENDOSCOPY;  Service: Endoscopy;  Laterality: N/A;  . ESOPHAGOGASTRODUODENOSCOPY (EGD) WITH PROPOFOL N/A 09/16/2015   Procedure: ESOPHAGOGASTRODUODENOSCOPY (EGD) WITH PROPOFOL;  Surgeon: Lollie Sails, MD;  Location: [facility] ENDOSCOPY;  Service: Endoscopy;  Laterality: N/A;  . ESOPHAGOGASTRODUODENOSCOPY (EGD) WITH PROPOFOL N/A 01/22/2016   Procedure: ESOPHAGOGASTRODUODENOSCOPY (EGD) WITH PROPOFOL;  Surgeon: [name], MD;  Location: [facility];  Service: Endoscopy;  Laterality: N/A;  . ESOPHAGOGASTRODUODENOSCOPY (EGD) WITH PROPOFOL N/A 04/01/2016   Procedure: ESOPHAGOGASTRODUODENOSCOPY (EGD) WITH PROPOFOL;  Surgeon: Lollie Sails, MD;  Location: [facility] ENDOSCOPY;  Service: Endoscopy;  Laterality: N/A;  . HERNIA REPAIR N/A 10/2015   abdominal   . IR RADIOLOGIST EVAL & MGMT  04/13/2017  . TONSILLECTOMY    . TUBAL LIGATION    . UMBILICAL HERNIA REPAIR N/A 11/06/2015   Procedure: HERNIA REPAIR UMBILICAL ADULT;  Surgeon: [name], MD;  Location: [facility] ORS;  Service: General;  Laterality: N/A;    Social History   Socioeconomic History  . Marital status:  Married    Spouse name: Not on file  . Number of children: Not on file  . Years of education: Not on file  . Highest education level: Not on file  Occupational History  . Not on file  Social Needs  . Financial resource strain: Not on file  . Food insecurity:    Worry: Not on file    Inability: Not on file  . Transportation needs:    Medical: Not on file    Non-medical: Not on file  Tobacco Use  . Smoking status: Current Every Day Smoker    Packs/day: 1.00    Years: 42.00    Pack years: 42.00    Types: Cigarettes  . Smokeless tobacco: Never Used  . Tobacco comment: 2ppd  until last 2 years  Substance and Sexual Activity  . Alcohol use: No    Alcohol/week: 0.0 oz    Comment: stopped drinking 05/12/15  . Drug use: No  . Sexual activity: Not on file  Lifestyle  . Physical activity:    Days per week: Not on file    Minutes per session: Not on file  . Stress: Not on file  Relationships  . Social connections:    Talks on phone: Not on file    Gets together: Not on file    Attends religious service: Not on file    Active member of club or organization: Not on file    Attends meetings of clubs or organizations: Not on file    Relationship status: Not on file  . Intimate partner violence:    Fear of current or ex partner: Not on file    Emotionally abused: Not on file    Physically abused: Not on file    Forced sexual activity: Not on file  Other Topics Concern  . Not on file  Social History Narrative   Lives at home with husband    Family History  Problem Relation Age of Onset  . Stroke Mother   . Heart disease Mother   . [name] Father   . Breast cancer Sister 63  . Kidney disease Neg Hx   . Bladder Cancer Neg Hx   . Kidney cancer Neg Hx   . Prostate cancer Neg Hx      Current Outpatient Medications:  .  furosemide (LASIX) 40 MG tablet, Take 40 mg by mouth 2 (two) times daily. , Disp: , Rfl:  .  lactulose (CHRONULAC) 10 GM/15ML solution, Take 10 g by mouth 3 (three) times daily as needed for mild constipation or moderate constipation. , Disp: , Rfl:  .  LYRICA 150 MG capsule, Take 150 mg by mouth 2 (two) times daily., Disp: , Rfl: 2 .  magnesium oxide (MAG-OX) 400 MG tablet, Take 400 mg by mouth daily., Disp: , Rfl:  .  metFORMIN (GLUCOPHAGE) 500 MG tablet, Take 500 mg by mouth 3 (three) times daily after meals., Disp: , Rfl:  .  Multiple Vitamins-Minerals (CENTRUM SILVER 50+WOMEN PO), Take 1 tablet by mouth daily., Disp: , Rfl:  .  [START ON 11/17/2017] Oxycodone HCl 20 MG TABS, Take 1 tablet (20 mg total) by mouth every 6 (six) hours.,  Disp: 120 tablet, Rfl: 0 .  pantoprazole (PROTONIX) 40 MG tablet, Take 40 mg by mouth twice daily., Disp: , Rfl: 11 .  PARoxetine (PAXIL) 40 MG tablet, Take 40 mg by mouth every morning., Disp: , Rfl: 2 .  spironolactone (ALDACTONE) 50 MG tablet, TAKE 2 TABLETS BY MOUTH EVERY  DAY, Disp: , Rfl:  .  thiamine (VITAMIN B-1) 100 MG tablet, Take 1 tablet (100 mg total) by mouth daily., Disp: 90 tablet, Rfl: 6 .  TRADJENTA 5 MG TABS tablet, Take 5 mg by mouth daily., Disp: , Rfl: 2  Physical exam:  Vitals:   10/04/17 1026 10/04/17 1038  BP:  110/78  Pulse:  100  Resp: 16   Temp:  98.5 F (36.9 C)  TempSrc:  Tympanic  Weight: 169 lb 3.2 oz (76.7 kg)   Height: 5\' 10"  (1.778 m)    Physical Exam  Constitutional: She is oriented to person, place, and time. She appears well-developed and well-nourished.  HENT:  Head: Normocephalic and atraumatic.  Angular cheilitis +  Eyes: Pupils are equal, round, and reactive to light. EOM are normal.  Neck: Normal range of motion.  Cardiovascular: Normal rate, regular rhythm and normal heart sounds.  Pulmonary/Chest: Effort normal and breath sounds normal.  Abdominal: Soft. Bowel sounds are normal.  Neurological: She is alert and oriented to person, place, and time.  Skin: Skin is warm and dry.     CMP Latest Ref Rng & Units 09/30/2017  Glucose 65 - 99 mg/dL -  BUN 6 - 20 mg/dL -  Creatinine 0.44 - 1.00 mg/dL 0.60  Sodium 135 - 145 mmol/L -  Potassium 3.5 - 5.1 mmol/L -  Chloride 101 - 111 mmol/L -  CO2 22 - 32 mmol/L -  Calcium 8.9 - 10.3 mg/dL -  Total Protein 6.5 - 8.1 g/dL -  Total Bilirubin 0.3 - 1.2 mg/dL -  Alkaline Phos 38 - 126 U/L -  AST 15 - 41 U/L -  ALT 14 - 54 U/L -   CBC Latest Ref Rng & Units 09/28/2017  WBC 3.6 - 11.0 K/uL 10.1  Hemoglobin 12.0 - 16.0 g/dL 12.9  Hematocrit 35.0 - 47.0 % 36.5  Platelets 150 - 440 K/uL 174    No images are attached to the encounter.  Ct Chest W Contrast  Result Date: 09/30/2017 CLINICAL  DATA:  Right lung cancer, right renal mass. Restaging assessment. EXAM: CT CHEST WITH CONTRAST CT ABDOMEN WITH AND WITHOUT CONTRAST TECHNIQUE: Multidetector CT imaging of the chest was performed during intravenous contrast administration. Multidetector CT imaging of the abdomen was performed following the standard protocol before and during bolus administration of intravenous contrast. CONTRAST:  118mL OMNIPAQUE IOHEXOL 300 MG/ML  SOLN COMPARISON:  Multiple exams, including 04/04/2017 FINDINGS: CT CHEST FINDINGS Cardiovascular: Atherosclerotic calcification of the aortic arch. Mediastinum/Nodes: Unremarkable Lungs/Pleura: 2.0 by 1.6 cm ground-glass density nodule in the right upper lobe on image 15/10, stable. 2.4 by 3.5 cm nodule with interstitial accentuation and ground-glass density in the right upper lobe on image 26/10, formerly 3.3 by 2.4 cm. In the area of the prior spiculated right upper lobe nodule there is indistinct airspace opacity likely from radiation pneumonitis. This somewhat obscures margins of the lesion. I suspect that the original lesion is shown on image 31/10, measuring 0.9 cm in AP dimension, previously 1.2 cm. A new ground-glass density left lower lobe nodule (compared to 04/04/2017) measures 2.2 by 1.9 cm on image 49/10. A 1.2 by 0.9 cm left lower lobe ground-glass density pulmonary nodule on image 39/10 previously measured 1.2 by 0.9 cm. A few other faint ground-glass density nodules are present bilaterally. In the left upper lobe there is a bandlike density shown on image 13/10 which appear stable. Centrilobular emphysema. Musculoskeletal: Posterolateral rod and pedicle screw fixation extending from the T5  level down to the sacrum. The right T12 screw may chronically capture the medial cortex of the pedicle. CT ABDOMEN FINDINGS Hepatobiliary: Diffuse hepatic steatosis. Small layering gallstones in the gallbladder. Nodular hepatic contour suggesting cirrhosis. No significant abnormal focal  arterial phase enhancing hepatic lesions to suggest hepatocellular carcinoma. No biliary dilatation. Pancreas: Unremarkable Spleen: Unremarkable Adrenals/Urinary Tract: Adrenal glands normal. Exophytic right kidney upper pole mass 2.6 by 2.7 by 2.6 cm (volume = 9.6 cm^3), previously 2.6 by 2.6 by 2.4 cm (volume = 8.5 cm^3) by my measurements. This bulges the renal capsule. Several other tiny hypodense lesions are present bilaterally in the kidneys, technically nonspecific due to small size. 2 mm nonobstructive right kidney lower pole calculus, image 70/5. Probable second 1-2 mm right kidney lower pole calculus on image 63/5. No additional urinary tract calculi are observed. Stomach/Bowel: Adipose deposition in the colon wall, normally this finding is incidental but there is a weak association with inflammatory bowel disease. Fatty prominence of the ileocecal valve. Vascular/Lymphatic: No pathologic retroperitoneal adenopathy. No tumor thrombus in the right renal vein. Aortoiliac atherosclerotic vascular disease. Other: No supplemental non-categorized findings. Musculoskeletal: Thoracolumbar posterolateral rod and pedicle screw fixator. As an oblique cross link edge component as shown on image 71/13. Small umbilical hernia contains nondilated loops of small bowel. IMPRESSION: 1. The right renal mass is about 13% larger in volume compared to the 04/04/2017 exam. No current adenopathy or tumor thrombus in the right renal vein. 2. Complex appearance of the lungs, with new airspace opacity around the dominant prior right upper lobe nodule which appears slightly smaller, and with multiple ground-glass density nodules which although technically nonspecific are somewhat concerning for low-grade multifocal adenocarcinoma. 3. Other imaging findings of potential clinical significance: Aortic Atherosclerosis (ICD10-I70.0) and Emphysema (ICD10-J43.9). Posterolateral rod and pedicle screw fixator in the thoracolumbar spine and  extending to the sacrum. Nonobstructive right nephrolithiasis. Nodular hepatic contour compatible with cirrhosis. Cholelithiasis. Diffuse hepatic steatosis. Small umbilical hernia contains nondilated loops of small bowel. Electronically Signed   By: Van Clines M.D.   On: 09/30/2017 12:55   Ct Abdomen W Wo Contrast  Result Date: 09/30/2017 CLINICAL DATA:  Right lung cancer, right renal mass. Restaging assessment. EXAM: CT CHEST WITH CONTRAST CT ABDOMEN WITH AND WITHOUT CONTRAST TECHNIQUE: Multidetector CT imaging of the chest was performed during intravenous contrast administration. Multidetector CT imaging of the abdomen was performed following the standard protocol before and during bolus administration of intravenous contrast. CONTRAST:  113mL OMNIPAQUE IOHEXOL 300 MG/ML  SOLN COMPARISON:  Multiple exams, including 04/04/2017 FINDINGS: CT CHEST FINDINGS Cardiovascular: Atherosclerotic calcification of the aortic arch. Mediastinum/Nodes: Unremarkable Lungs/Pleura: 2.0 by 1.6 cm ground-glass density nodule in the right upper lobe on image 15/10, stable. 2.4 by 3.5 cm nodule with interstitial accentuation and ground-glass density in the right upper lobe on image 26/10, formerly 3.3 by 2.4 cm. In the area of the prior spiculated right upper lobe nodule there is indistinct airspace opacity likely from radiation pneumonitis. This somewhat obscures margins of the lesion. I suspect that the original lesion is shown on image 31/10, measuring 0.9 cm in AP dimension, previously 1.2 cm. A new ground-glass density left lower lobe nodule (compared to 04/04/2017) measures 2.2 by 1.9 cm on image 49/10. A 1.2 by 0.9 cm left lower lobe ground-glass density pulmonary nodule on image 39/10 previously measured 1.2 by 0.9 cm. A few other faint ground-glass density nodules are present bilaterally. In the left upper lobe there is a bandlike density shown on image  13/10 which appear stable. Centrilobular emphysema.  Musculoskeletal: Posterolateral rod and pedicle screw fixation extending from the T5 level down to the sacrum. The right T12 screw may chronically capture the medial cortex of the pedicle. CT ABDOMEN FINDINGS Hepatobiliary: Diffuse hepatic steatosis. Small layering gallstones in the gallbladder. Nodular hepatic contour suggesting cirrhosis. No significant abnormal focal arterial phase enhancing hepatic lesions to suggest hepatocellular carcinoma. No biliary dilatation. Pancreas: Unremarkable Spleen: Unremarkable Adrenals/Urinary Tract: Adrenal glands normal. Exophytic right kidney upper pole mass 2.6 by 2.7 by 2.6 cm (volume = 9.6 cm^3), previously 2.6 by 2.6 by 2.4 cm (volume = 8.5 cm^3) by my measurements. This bulges the renal capsule. Several other tiny hypodense lesions are present bilaterally in the kidneys, technically nonspecific due to small size. 2 mm nonobstructive right kidney lower pole calculus, image 70/5. Probable second 1-2 mm right kidney lower pole calculus on image 63/5. No additional urinary tract calculi are observed. Stomach/Bowel: Adipose deposition in the colon wall, normally this finding is incidental but there is a weak association with inflammatory bowel disease. Fatty prominence of the ileocecal valve. Vascular/Lymphatic: No pathologic retroperitoneal adenopathy. No tumor thrombus in the right renal vein. Aortoiliac atherosclerotic vascular disease. Other: No supplemental non-categorized findings. Musculoskeletal: Thoracolumbar posterolateral rod and pedicle screw fixator. As an oblique cross link edge component as shown on image 71/13. Small umbilical hernia contains nondilated loops of small bowel. IMPRESSION: 1. The right renal mass is about 13% larger in volume compared to the 04/04/2017 exam. No current adenopathy or tumor thrombus in the right renal vein. 2. Complex appearance of the lungs, with new airspace opacity around the dominant prior right upper lobe nodule which appears  slightly smaller, and with multiple ground-glass density nodules which although technically nonspecific are somewhat concerning for low-grade multifocal adenocarcinoma. 3. Other imaging findings of potential clinical significance: Aortic Atherosclerosis (ICD10-I70.0) and Emphysema (ICD10-J43.9). Posterolateral rod and pedicle screw fixator in the thoracolumbar spine and extending to the sacrum. Nonobstructive right nephrolithiasis. Nodular hepatic contour compatible with cirrhosis. Cholelithiasis. Diffuse hepatic steatosis. Small umbilical hernia contains nondilated loops of small bowel. Electronically Signed   By: Van Clines M.D.   On: 09/30/2017 12:55     Assessment and plan- Patient is a 57 y.o. female with following issues:  1.  Iron deficiency anemia: Currently her hemoglobin is stable around 12.  Iron studies do not reveal any overt iron deficiency.We will plan to repeat CBC ferritin and iron studies in 3 months  2.  Lung nodules: I have personally reviewed CT chest images independently and discussed findings with the patient.  Patient continues to have bilateral groundglass hazy opacities.  She also has a new groundglass opacity in the left lower lobe nodule.  This could be nonspecific.  However low-grade adenocarcinoma cannot be ruled out.  I will review her CT scans at the tumor board this week and see if there would be any role for repeat biopsy of 1 of these groundglass opacities.  Patient does not wish to go through any chemotherapy if this were to be lung cancer.  3 renal cell carcinoma.  Patient is known to haveRight renal mass which currently appears about 13% larger as compared to the prior mass seen in January 2019.  Patient was not deemed to be a surgical candidate and did not want to undergo cryotherapy in the past.  I discussed the findings of the CT with her and she is willing to consider cryoablation of that were to be an option.  I  will touch base with Dr. Erlene Quan regarding  this  4.  Easy bruising: Her coags are within normal limits this is likely nonspecific secondary to her smoking and skin fragility.  I will check B12 and ascorbic acid level today especially given her angular cheilitis for which I will prescribe topical triamcinolone cream.  If her angular cheilitis does not get better she needs to get in touch with dermatology for possible biopsy  I will reach out to the patient after we discussed the case at the tumor board and decide about her follow-up accordingly   Visit Diagnosis 1. Angular cheilitis   2. Easy bruising   3. Macrocytic anemia      Dr. Randa Evens, MD, MPH [facility] at [facility] [phone] 10/04/2017 3:29 PM

## 2017-10-04 NOTE — Progress Notes (Signed)
Patient here for results, she reports spontaneous small bruising on her arms and knees. She reports that her

## 2017-10-04 NOTE — Progress Notes (Signed)
No treatment today per Courtney/MD

## 2017-10-06 ENCOUNTER — Telehealth: Payer: Self-pay | Admitting: *Deleted

## 2017-10-06 LAB — VITAMIN C: Vitamin C: 0.4 mg/dL (ref 0.2–2.0)

## 2017-10-06 NOTE — Telephone Encounter (Signed)
Patient called stating she was expecting a call from Dr Janese Banks today regarding her treatment and she has not heard form anyone. She states she is very anxious and would like a call ASAP

## 2017-10-06 NOTE — Telephone Encounter (Signed)
We were unable to discuss her case today at the conference as we ran out of time. I will be discussing her case next Thursday and call her after that. Hopefully Dr. Erlene Quan who was out on vacation this week will be back by then as well. Thanks, Astrid Divine

## 2017-10-06 NOTE — Telephone Encounter (Signed)
Patient informed her case will not be discussed until next week and she thanked me for letting her know

## 2017-10-12 ENCOUNTER — Ambulatory Visit: Payer: Self-pay | Admitting: Urology

## 2017-10-13 ENCOUNTER — Encounter: Payer: Self-pay | Admitting: Urology

## 2017-10-13 ENCOUNTER — Ambulatory Visit: Payer: Medicare Other | Admitting: Oncology

## 2017-10-13 ENCOUNTER — Other Ambulatory Visit: Payer: Medicare Other

## 2017-10-17 ENCOUNTER — Telehealth: Payer: Self-pay | Admitting: *Deleted

## 2017-10-17 ENCOUNTER — Other Ambulatory Visit: Payer: Self-pay | Admitting: *Deleted

## 2017-10-17 DIAGNOSIS — N2889 Other specified disorders of kidney and ureter: Secondary | ICD-10-CM

## 2017-10-17 DIAGNOSIS — C3411 Malignant neoplasm of upper lobe, right bronchus or lung: Secondary | ICD-10-CM

## 2017-10-17 DIAGNOSIS — R918 Other nonspecific abnormal finding of lung field: Secondary | ICD-10-CM

## 2017-10-17 NOTE — Telephone Encounter (Signed)
Called Dr. Anselm Pancoast office and spoke to [place] and she will call the patient and get her appt. She has an appt next wed either am or pm and she will get one for pt.  I have put the order in for ct scan and will send message to [facility] to schedule the scan and call pt with the appt.

## 2017-10-17 NOTE — Telephone Encounter (Signed)
Called home number and busy signal, called cellphone ang got voicemail-left message that I need to go over the ct results associated with the multidisciplanry conference last Thursday.  Asked her to call me back in [place] today or I will try calling her back later afternoon or tom.

## 2017-10-18 ENCOUNTER — Other Ambulatory Visit: Payer: Self-pay | Admitting: *Deleted

## 2017-10-18 DIAGNOSIS — C3411 Malignant neoplasm of upper lobe, right bronchus or lung: Secondary | ICD-10-CM

## 2017-10-18 DIAGNOSIS — D500 Iron deficiency anemia secondary to blood loss (chronic): Secondary | ICD-10-CM

## 2017-10-26 ENCOUNTER — Other Ambulatory Visit: Payer: Medicare Other

## 2017-11-15 ENCOUNTER — Ambulatory Visit
Admission: RE | Admit: 2017-11-15 | Discharge: 2017-11-15 | Disposition: A | Discharge status: Home / Self Care | Payer: Medicare Other | Source: Ambulatory Visit | Attending: Diagnostic Radiology | Admitting: Diagnostic Radiology

## 2017-11-15 ENCOUNTER — Encounter: Payer: Self-pay | Admitting: Radiology

## 2017-11-15 DIAGNOSIS — N2889 Other specified disorders of kidney and ureter: Secondary | ICD-10-CM

## 2017-11-15 HISTORY — PX: IR RADIOLOGIST EVAL & MGMT: IMG5224

## 2017-11-15 NOTE — Progress Notes (Addendum)
Chief Complaint: Patient was seen in consultation today for follow-up of right renal mass.  Referring Physician(s): Imagene Riches, A   History of Present Illness: Audrey Garrett is a 57 y.o. female with multiple medical problems including stage I lung cancer treated with SBRT, cirrhosis and hepatitis C and a suspicious right renal lesion.  I saw the patient on 04/13/2017 and we discussed the options of percutaneous ablation of the right renal lesion.  Patient has had a follow-up CT since her last visit which demonstrated some enlargement of the right renal lesion.  There continues to be no evidence for metastatic renal cell carcinoma but the patient does have indeterminate lung lesions which are more concerning for primary lung neoplastic processes.  Patient wanted to continue surveillance of the right renal lesion after on our first visit.  Patient has recently followed up with oncology and now she is anxious to have the right renal lesion treated.  Patient was treated for hepatitis C with [place].  She has not required paracentesis lately and feels like her overall health is pretty good.  She continues to smoke.  She does not complain of abdominal pain or hematuria.  Past Medical History:  Diagnosis Date  . Alcohol abuse   . Alcoholic cirrhosis of liver without ascites ([place])   . Anemia   . Anxiety   . Arthropathy   . Back pain   . Bronchitis   . BRONCHITIS, ACUTE 11/01/2009   Qualifier: Diagnosis of  By: Alveta Heimlich MD, Cornelia Copa    . Chronic hepatitis C ([place])   . Chronic LBP 12/25/2014   Overview:  Post extensive surgery   . Cirrhosis ([place])   . DDD (degenerative disc disease), lumbar 01/01/2015  . Depression   . Diabetes mellitus without complication ([place])   . Diverticulosis   . Edema   . Fatigue   . Fibromyalgia   . GERD (gastroesophageal reflux disease)    gastroparesis  . High risk medications (not anticoagulants) long-term use   . Hyperglycemia   . Hypertension   . Kidney mass      11/17 small mass-  . Left leg pain 01/01/2015  . Lumbago   . Lumbar radicular pain 01/01/2015  . Muscle spasm   . Neck pain 01/01/2015  . Polyarthritis   . Reflux   . Restless leg syndrome    01/2016  . RLS (restless legs syndrome)   . Sacroiliac pain 01/01/2015  . Shoulder pain   . Sinusitis   . SOB (shortness of breath)   . Spine disorder   . Stress headaches   . Upper back pain 01/01/2015  . Urinary frequency   . Vitamin D deficiency disease     Past Surgical History:  Procedure Laterality Date  . BACK SURGERY  12/19/2011  . BACK SURGERY    . COLONOSCOPY WITH PROPOFOL N/A 09/25/2015   Procedure: COLONOSCOPY WITH PROPOFOL;  Surgeon: Lollie Sails, MD;  Location: [facility] ENDOSCOPY;  Service: Endoscopy;  Laterality: N/A;  . COLONOSCOPY WITH PROPOFOL N/A 04/01/2016   Procedure: COLONOSCOPY WITH PROPOFOL;  Surgeon: Lollie Sails, MD;  Location: [facility] ENDOSCOPY;  Service: Endoscopy;  Laterality: N/A;  . ELECTROMAGNETIC NAVIGATION BROCHOSCOPY N/A 11/25/2016   Procedure: ELECTROMAGNETIC NAVIGATION BRONCHOSCOPY;  Surgeon: Flora Lipps, MD;  Location: [facility] ORS;  Service: Cardiopulmonary;  Laterality: N/A;  . ESOPHAGOGASTRODUODENOSCOPY N/A 04/14/2015   Procedure: ESOPHAGOGASTRODUODENOSCOPY (EGD);  Surgeon: Lollie Sails, MD;  Location: [facility] ENDOSCOPY;  Service: Endoscopy;  Laterality: N/A;  .  ESOPHAGOGASTRODUODENOSCOPY (EGD) WITH PROPOFOL N/A 09/16/2015   Procedure: ESOPHAGOGASTRODUODENOSCOPY (EGD) WITH PROPOFOL;  Surgeon: Lollie Sails, MD;  Location: [facility] ENDOSCOPY;  Service: Endoscopy;  Laterality: N/A;  . ESOPHAGOGASTRODUODENOSCOPY (EGD) WITH PROPOFOL N/A 01/22/2016   Procedure: ESOPHAGOGASTRODUODENOSCOPY (EGD) WITH PROPOFOL;  Surgeon: [name], MD;  Location: [place];  Service: Endoscopy;  Laterality: N/A;  . ESOPHAGOGASTRODUODENOSCOPY (EGD) WITH PROPOFOL N/A 04/01/2016   Procedure: ESOPHAGOGASTRODUODENOSCOPY (EGD) WITH PROPOFOL;  Surgeon: Lollie Sails, MD;   Location: [facility] ENDOSCOPY;  Service: Endoscopy;  Laterality: N/A;  . HERNIA REPAIR N/A 10/2015   abdominal   . IR RADIOLOGIST EVAL & MGMT  04/13/2017  . IR RADIOLOGIST EVAL & MGMT  11/15/2017  . TONSILLECTOMY    . TUBAL LIGATION    . UMBILICAL HERNIA REPAIR N/A 11/06/2015   Procedure: HERNIA REPAIR UMBILICAL ADULT;  Surgeon: [name], MD;  Location: [facility] ORS;  Service: General;  Laterality: N/A;    Allergies: Patient has no known allergies.  Medications: Prior to Admission medications   Medication Sig Start Date End Date Taking? Authorizing Provider  furosemide (LASIX) 40 MG tablet Take 40 mg by mouth 2 (two) times daily.  10/13/15  Yes [provider]  lactulose (CHRONULAC) 10 GM/15ML solution Take 10 g by mouth 3 (three) times daily as needed for mild constipation or moderate constipation.    Yes [provider]  LYRICA 150 MG capsule Take 150 mg by mouth 2 (two) times daily. 04/10/17  Yes [provider]  metFORMIN (GLUCOPHAGE) 500 MG tablet Take 500 mg by mouth 3 (three) times daily after meals.   Yes [provider]  Multiple Vitamins-Minerals (CENTRUM SILVER 50+WOMEN PO) Take 1 tablet by mouth daily.   Yes [provider]  Oxycodone HCl 20 MG TABS Take 1 tablet (20 mg total) by mouth every 6 (six) hours. 11/17/17 12/17/17 Yes King, Diona Foley, NP  spironolactone (ALDACTONE) 50 MG tablet TAKE 2 TABLETS BY MOUTH EVERY DAY 09/08/16  Yes [provider]  magnesium oxide (MAG-OX) 400 MG tablet Take 400 mg by mouth daily.    [provider]  pantoprazole (PROTONIX) 40 MG tablet Take 40 mg by mouth twice daily. 07/29/16   [provider]  PARoxetine (PAXIL) 40 MG tablet Take 40 mg by mouth every morning. 01/20/16   [provider]  thiamine (VITAMIN B-1) 100 MG tablet Take 1 tablet (100 mg total) by mouth daily. Patient not taking: Reported on 11/15/2017 02/11/16   Creola Corn, MD  TRADJENTA 5 MG TABS  tablet Take 5 mg by mouth daily. 04/06/17   [provider]     Family History  Problem Relation Age of Onset  . Stroke Mother   . Heart disease Mother   . [name] Father   . Breast cancer Sister 40  . Kidney disease Neg Hx   . Bladder Cancer Neg Hx   . Kidney cancer Neg Hx   . Prostate cancer Neg Hx     Social History   Socioeconomic History  . Marital status: Married    Spouse name: Not on file  . Number of children: Not on file  . Years of education: Not on file  . Highest education level: Not on file  Occupational History  . Not on file  Social Needs  . Financial resource strain: Not on file  . Food insecurity:    Worry: Not on file    Inability: Not on file  . Transportation needs:  Medical: Not on file    Non-medical: Not on file  Tobacco Use  . Smoking status: Current Every Day Smoker    Packs/day: 1.00    Years: 42.00    Pack years: 42.00    Types: Cigarettes  . Smokeless tobacco: Never Used  . Tobacco comment: 2ppd until last 2 years  Substance and Sexual Activity  . Alcohol use: No    Alcohol/week: 0.0 standard drinks    Comment: stopped drinking 05/12/15  . Drug use: No  . Sexual activity: Not on file  Lifestyle  . Physical activity:    Days per week: Not on file    Minutes per session: Not on file  . Stress: Not on file  Relationships  . Social connections:    Talks on phone: Not on file    Gets together: Not on file    Attends religious service: Not on file    Active member of club or organization: Not on file    Attends meetings of clubs or organizations: Not on file    Relationship status: Not on file  Other Topics Concern  . Not on file  Social History Narrative   Lives at home with husband     Review of Systems: A 12 point ROS discussed and pertinent positives are indicated in the HPI above.  All other systems are negative.  Review of Systems  Constitutional: Negative.   Respiratory: Negative.   Cardiovascular:  Negative.   Gastrointestinal: Negative.   Genitourinary: Negative.     Vital Signs: BP 98/62   Pulse 81   Temp 98.5 F (36.9 C) (Oral)   Resp 15   Ht 5\' 10"  (1.778 m)   Wt 79.4 kg   SpO2 97%   BMI 25.11 kg/m   Physical Exam  Constitutional: No distress.  HENT:  Mouth/Throat: Oropharynx is clear and moist.  Cardiovascular: Normal rate, regular rhythm and normal heart sounds.  Pulmonary/Chest: Effort normal and breath sounds normal.  Abdominal: Soft. Bowel sounds are normal. She exhibits no distension. There is no tenderness.  Skin:  Multiple areas of skin bruising throughout the legs and arms.    Mallampati Score: 2    Imaging: CLINICAL DATA:  Right lung cancer, right renal mass. Restaging assessment.  EXAM: CT CHEST WITH CONTRAST  CT ABDOMEN WITH AND WITHOUT CONTRAST  TECHNIQUE: Multidetector CT imaging of the chest was performed during intravenous contrast administration. Multidetector CT imaging of the abdomen was performed following the standard protocol before and during bolus administration of intravenous contrast.  CONTRAST:  112mL OMNIPAQUE IOHEXOL 300 MG/ML  SOLN  COMPARISON:  Multiple exams, including 04/04/2017  FINDINGS: CT CHEST FINDINGS  Cardiovascular: Atherosclerotic calcification of the aortic arch.  Mediastinum/Nodes: Unremarkable  Lungs/Pleura: 2.0 by 1.6 cm ground-glass density nodule in the right upper lobe on image 15/10, stable. 2.4 by 3.5 cm nodule with interstitial accentuation and ground-glass density in the right upper lobe on image 26/10, formerly 3.3 by 2.4 cm. In the area of the prior spiculated right upper lobe nodule there is indistinct airspace opacity likely from radiation pneumonitis. This somewhat obscures margins of the lesion. I suspect that the original lesion is shown on image 31/10, measuring 0.9 cm in AP dimension, previously 1.2 cm.  A new ground-glass density left lower lobe nodule (compared  to 04/04/2017) measures 2.2 by 1.9 cm on image 49/10.  A 1.2 by 0.9 cm left lower lobe ground-glass density pulmonary nodule on image 39/10 previously measured 1.2 by 0.9 cm.  A few other faint ground-glass density nodules are present bilaterally.  In the left upper lobe there is a bandlike density shown on image 13/10 which appear stable.  Centrilobular emphysema.  Musculoskeletal: Posterolateral rod and pedicle screw fixation extending from the T5 level down to the sacrum. The right T12 screw may chronically capture the medial cortex of the pedicle.  CT ABDOMEN FINDINGS  Hepatobiliary: Diffuse hepatic steatosis. Small layering gallstones in the gallbladder. Nodular hepatic contour suggesting cirrhosis. No significant abnormal focal arterial phase enhancing hepatic lesions to suggest hepatocellular carcinoma. No biliary dilatation.  Pancreas: Unremarkable  Spleen: Unremarkable  Adrenals/Urinary Tract: Adrenal glands normal. Exophytic right kidney upper pole mass 2.6 by 2.7 by 2.6 cm (volume = 9.6 cm^3), previously 2.6 by 2.6 by 2.4 cm (volume = 8.5 cm^3) by my measurements. This bulges the renal capsule. Several other tiny hypodense lesions are present bilaterally in the kidneys, technically nonspecific due to small size.  2 mm nonobstructive right kidney lower pole calculus, image 70/5. Probable second 1-2 mm right kidney lower pole calculus on image 63/5. No additional urinary tract calculi are observed.  Stomach/Bowel: Adipose deposition in the colon wall, normally this finding is incidental but there is a weak association with inflammatory bowel disease. Fatty prominence of the ileocecal valve.  Vascular/Lymphatic: No pathologic retroperitoneal adenopathy. No tumor thrombus in the right renal vein. Aortoiliac atherosclerotic vascular disease.  Other: No supplemental non-categorized findings.  Musculoskeletal: Thoracolumbar posterolateral rod and  pedicle screw fixator. As an oblique cross link edge component as shown on image 71/13.  Small umbilical hernia contains nondilated loops of small bowel.  IMPRESSION: 1. The right renal mass is about 13% larger in volume compared to the 04/04/2017 exam. No current adenopathy or tumor thrombus in the right renal vein. 2. Complex appearance of the lungs, with new airspace opacity around the dominant prior right upper lobe nodule which appears slightly smaller, and with multiple ground-glass density nodules which although technically nonspecific are somewhat concerning for low-grade multifocal adenocarcinoma. 3. Other imaging findings of potential clinical significance: Aortic Atherosclerosis (ICD10-I70.0) and Emphysema (ICD10-J43.9). Posterolateral rod and pedicle screw fixator in the thoracolumbar spine and extending to the sacrum. Nonobstructive right nephrolithiasis. Nodular hepatic contour compatible with cirrhosis. Cholelithiasis. Diffuse hepatic steatosis. Small umbilical hernia contains nondilated loops of small bowel.   Electronically Signed   By: Van Clines M.D.   On: 09/30/2017 12:55  Labs:  CBC: Recent Labs    04/23/17 1140 06/27/17 1146 08/24/17 1015 09/28/17 1133  WBC 8.4 8.4 8.1 10.1  HGB 12.2 12.1 12.7 12.9  HCT 35.2 35.4 36.3 36.5  PLT 136* 173 151 174    COAGS: Recent Labs    11/19/16 1024 08/24/17 1015  INR 1.29 1.16  APTT  --  35    BMP: Recent Labs    11/25/16 1232 04/04/17 1058 04/23/17 1140 09/30/17 1126  NA 139  --  130*  --   K 3.4*  --  4.0  --   CL 100*  --  93*  --   CO2 29  --  25  --   GLUCOSE 165*  --  160*  --   BUN <5*  --  11  --   CALCIUM 10.0  --  8.8*  --   CREATININE 0.82 0.70 0.72 0.60  GFRNONAA >60  --  >60  --   GFRAA >60  --  >60  --     LIVER FUNCTION TESTS: Recent Labs    11/25/16  1232 04/23/17 1140  BILITOT 1.0 0.6  AST 78* 68*  ALT 38 35  ALKPHOS 82 90  PROT 7.7 7.5  ALBUMIN 4.3 4.2     TUMOR MARKERS: No results for input(s): AFPTM, CEA, CA199, CHROMGRNA in the last 8760 hours.  Assessment and Plan:  57 year old with a suspicious right renal lesion.  I personally reviewed the most recent CT images from 09/30/2017 and compared from a study on 04/04/2017.  The lesion is highly suspicious for a renal cell carcinoma.  I could measure the lesion up to 3 cm on the sagittal postcontrast images, sequence 14, image 34.  There has been minimal change in the maximum dimension since the exam of 04/04/2017 but the radiology report said that the lesion was 13% larger.  No other suspicious renal lesions and no evidence for metastatic renal cell carcinoma.  I reviewed the imaging with the patient and we discussed the percutaneous ablation options.  Patient understands that treating this lesion with percutaneous ablation will be more difficult as it enlarges.  I explained that as the lesion gets larger is harder to get a clean margin and its also much more easy to have complications to the right kidney or to the adjacent structures.  At this time, the patient would like to proceed with image guided ablation of the lesion.  We predominately discussed cryoablation of the lesion.  However, due to patient's cirrhosis and excessive skin bruising issues, I think the patient may have less risk of bleeding if we perform image guided thermal ablation of the lesion rather than cryoablation.  I will discuss this therapy with the patient on the telephone but the procedure risks are similar to what we have already discussed.  Patient understands that she will require general anesthesia for this procedure.  We also discussed percutaneous biopsy of the lesion before or during the treatment.  Patient would prefer to limit the number of procedures and she elected to have the biopsy done at the time of the ablation.  We will schedule the patient for image guided ablation of this right renal lesion with general  anesthesia.  Thank you for this interesting consult.  I greatly enjoyed meeting Audrey Garrett and look forward to participating in their care.  A copy of this report was sent to the requesting provider on this date.  Electronically Signed: Burman Riis 11/15/2017, 4:19 PM   I spent a total of    25 Minutes in face to face in clinical consultation, greater than 50% of which was counseling/coordinating care for image guided right renal lesion ablation..  Patient ID: Audrey Garrett, female   DOB: 02/01/1961, 57 y.o.   MRN: [id number]

## 2017-11-17 ENCOUNTER — Other Ambulatory Visit (HOSPITAL_COMMUNITY): Payer: Self-pay | Admitting: Diagnostic Radiology

## 2017-11-17 DIAGNOSIS — N2889 Other specified disorders of kidney and ureter: Secondary | ICD-10-CM

## 2017-11-25 IMAGING — US US PARACENTESIS
1 series · 6 of 6 positions shown · non-contrast
Comparison: none

INDICATION: Ascites

[Series 1: us paracentesis · 0.26mm/px · 6 of 6 slices shown]
[im 1/6]
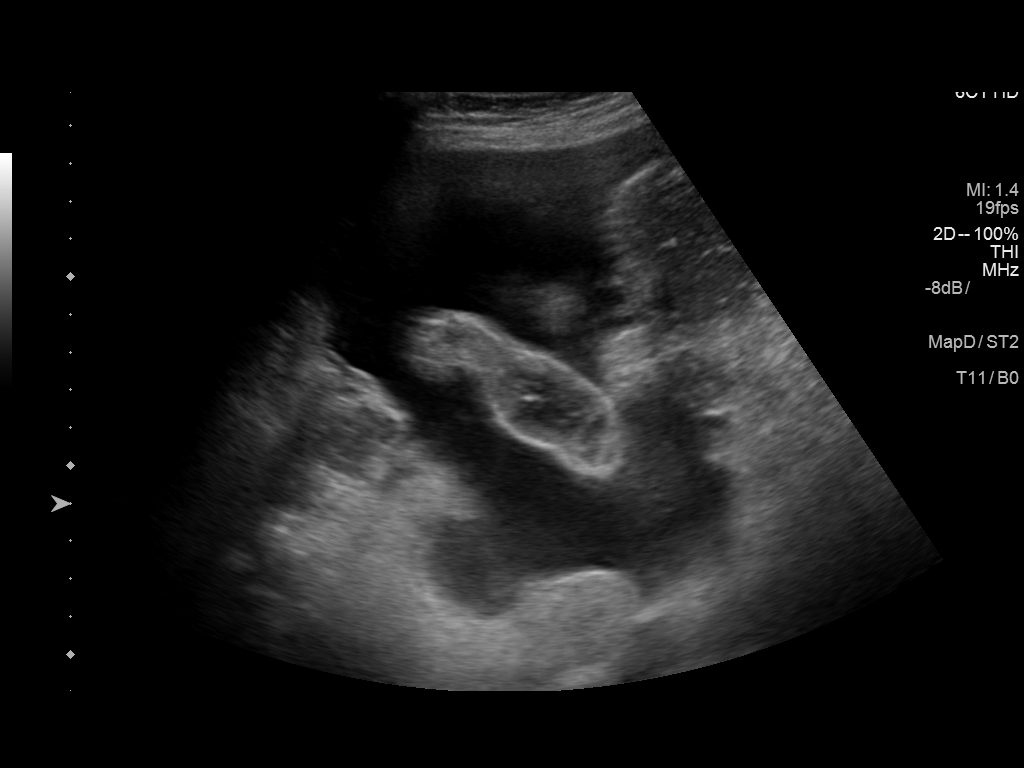
[im 2/6]
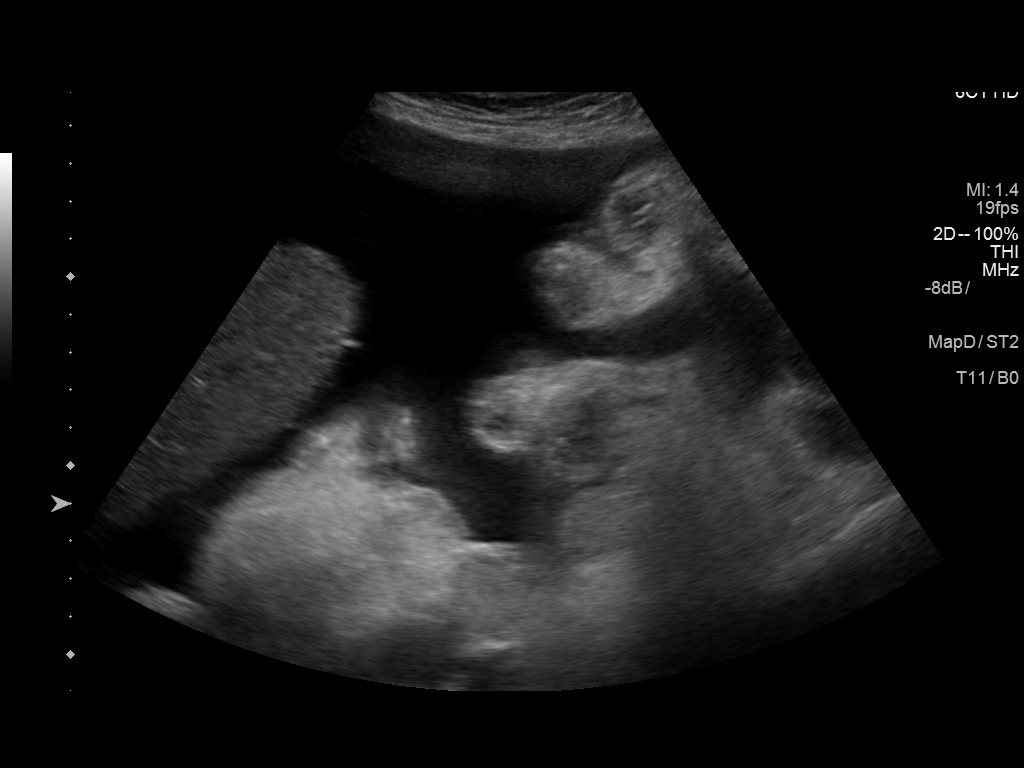
[im 3/6]
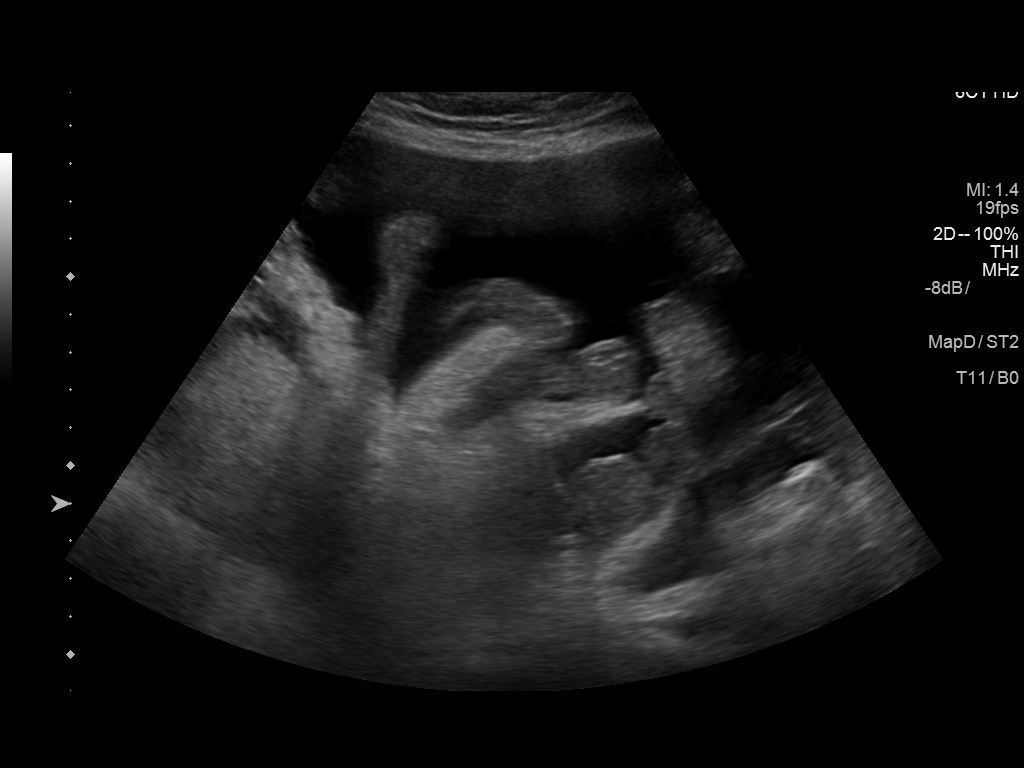
[im 4/6]
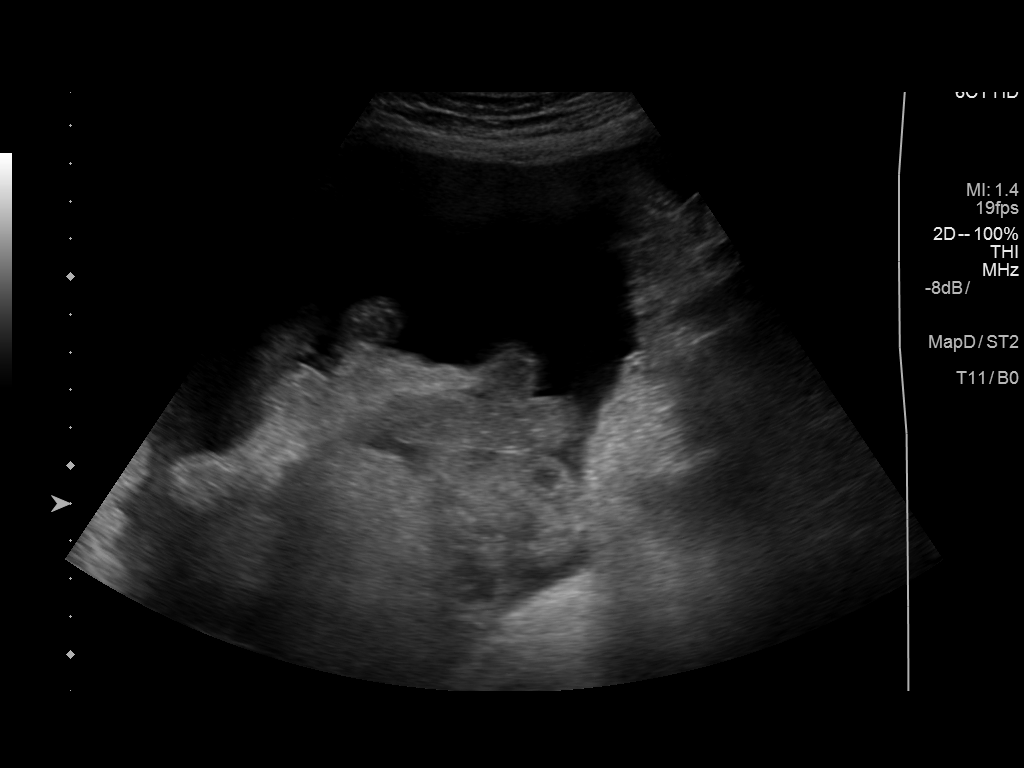
[im 5/6]
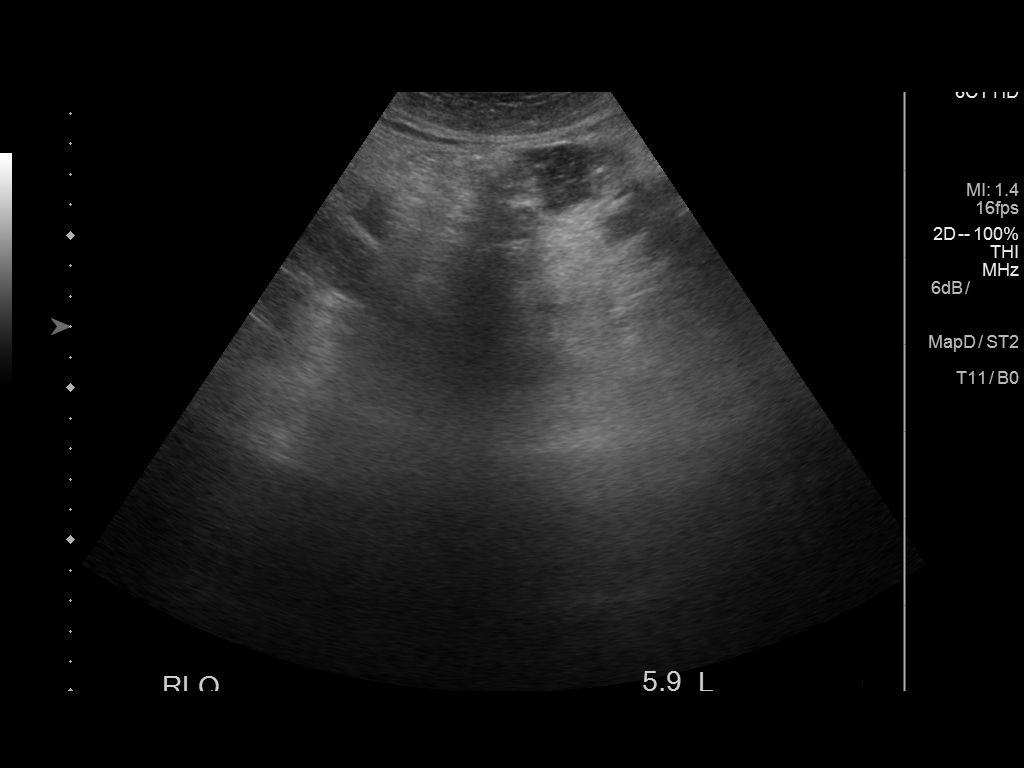
[im 6/6]
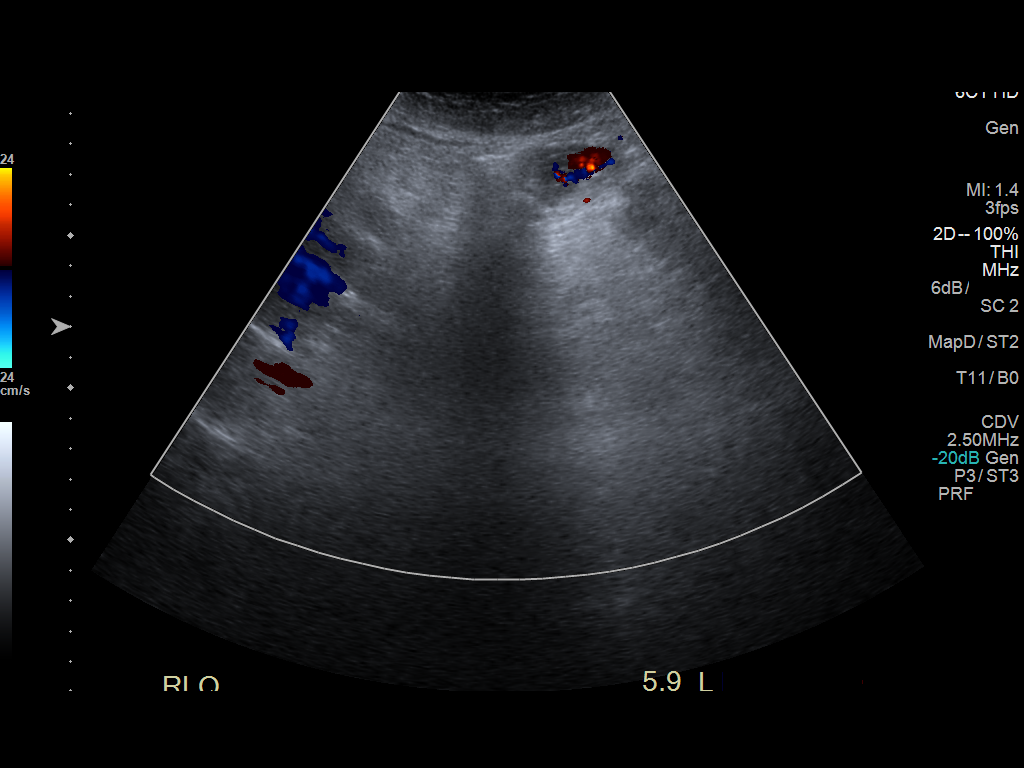

[6 of 6 positions shown; findings below may reference images not displayed]

EXAM:
ULTRASOUND GUIDED therapeutic and diagnostic PARACENTESIS

MEDICATIONS:
None.

COMPLICATIONS:
None immediate.

PROCEDURE:
Informed written consent was obtained from the patient after a
discussion of the risks, benefits and alternatives to treatment. A
timeout was performed prior to the initiation of the procedure.

Initial ultrasound scanning demonstrates a large amount of ascites
within the right lower abdominal quadrant. The right lower abdomen
was prepped and draped in the usual sterile fashion. 1% lidocaine
with epinephrine was used for local anesthesia.

Following this, a Safe-T-Centesis catheter was introduced. An
ultrasound image was saved for documentation purposes. The
paracentesis was performed. The catheter was removed and a dressing
was applied. The patient tolerated the procedure well without
immediate post procedural complication.
FINDINGS: A total of approximately 5.9 L of serous fluid was removed. Samples
were sent to the laboratory as requested by the clinical team.
IMPRESSION: Successful ultrasound-guided paracentesis yielding 5.9 liters of
peritoneal fluid.

## 2017-12-01 ENCOUNTER — Encounter: Payer: Self-pay | Admitting: Nurse Practitioner

## 2017-12-01 ENCOUNTER — Other Ambulatory Visit: Payer: Self-pay

## 2017-12-01 ENCOUNTER — Ambulatory Visit: Payer: Medicare Other | Attending: Nurse Practitioner | Admitting: Nurse Practitioner

## 2017-12-01 VITALS — BP 101/72 | HR 86 | Temp 98.4°F | Resp 18 | Ht 70.0 in | Wt 175.0 lb

## 2017-12-01 DIAGNOSIS — D509 Iron deficiency anemia, unspecified: Secondary | ICD-10-CM | POA: Diagnosis not present

## 2017-12-01 DIAGNOSIS — M160 Bilateral primary osteoarthritis of hip: Secondary | ICD-10-CM | POA: Insufficient documentation

## 2017-12-01 DIAGNOSIS — M47816 Spondylosis without myelopathy or radiculopathy, lumbar region: Secondary | ICD-10-CM | POA: Diagnosis not present

## 2017-12-01 DIAGNOSIS — Z79899 Other long term (current) drug therapy: Secondary | ICD-10-CM | POA: Diagnosis not present

## 2017-12-01 DIAGNOSIS — Z7984 Long term (current) use of oral hypoglycemic drugs: Secondary | ICD-10-CM | POA: Diagnosis not present

## 2017-12-01 DIAGNOSIS — E1165 Type 2 diabetes mellitus with hyperglycemia: Secondary | ICD-10-CM | POA: Diagnosis not present

## 2017-12-01 DIAGNOSIS — K7460 Unspecified cirrhosis of liver: Secondary | ICD-10-CM | POA: Insufficient documentation

## 2017-12-01 DIAGNOSIS — F339 Major depressive disorder, recurrent, unspecified: Secondary | ICD-10-CM | POA: Diagnosis not present

## 2017-12-01 DIAGNOSIS — Z5181 Encounter for therapeutic drug level monitoring: Secondary | ICD-10-CM | POA: Diagnosis present

## 2017-12-01 DIAGNOSIS — Z79891 Long term (current) use of opiate analgesic: Secondary | ICD-10-CM | POA: Diagnosis not present

## 2017-12-01 DIAGNOSIS — M542 Cervicalgia: Secondary | ICD-10-CM | POA: Diagnosis not present

## 2017-12-01 DIAGNOSIS — G8929 Other chronic pain: Secondary | ICD-10-CM

## 2017-12-01 DIAGNOSIS — M4802 Spinal stenosis, cervical region: Secondary | ICD-10-CM | POA: Diagnosis not present

## 2017-12-01 DIAGNOSIS — F17210 Nicotine dependence, cigarettes, uncomplicated: Secondary | ICD-10-CM | POA: Diagnosis not present

## 2017-12-01 DIAGNOSIS — G894 Chronic pain syndrome: Secondary | ICD-10-CM

## 2017-12-01 DIAGNOSIS — M7918 Myalgia, other site: Secondary | ICD-10-CM | POA: Insufficient documentation

## 2017-12-01 DIAGNOSIS — M549 Dorsalgia, unspecified: Secondary | ICD-10-CM | POA: Diagnosis not present

## 2017-12-01 DIAGNOSIS — Z760 Encounter for issue of repeat prescription: Secondary | ICD-10-CM | POA: Insufficient documentation

## 2017-12-01 MED ORDER — KETOROLAC TROMETHAMINE 60 MG/2ML IM SOLN
INTRAMUSCULAR | Status: AC
  Filled 2017-12-01: qty 2

## 2017-12-01 MED ORDER — CYCLOBENZAPRINE HCL 5 MG PO TABS: 5.0000 mg | ORAL_TABLET | Freq: Three times a day (TID) | ORAL | 2 refills | Status: DC | PRN

## 2017-12-01 MED ORDER — OXYCODONE HCL 20 MG PO TABS: 1.0000 | ORAL_TABLET | Freq: Four times a day (QID) | ORAL | 0 refills | Status: DC | PRN

## 2017-12-01 MED ORDER — ORPHENADRINE CITRATE 30 MG/ML IJ SOLN
INTRAMUSCULAR | Status: AC
  Filled 2017-12-01: qty 2

## 2017-12-01 MED ORDER — ORPHENADRINE CITRATE 30 MG/ML IJ SOLN
60.0000 mg | Freq: Once | INTRAMUSCULAR | Status: AC
  Administered 2017-12-01: 60 mg via INTRAMUSCULAR

## 2017-12-01 MED ORDER — KETOROLAC TROMETHAMINE 60 MG/2ML IM SOLN
60.0000 mg | Freq: Once | INTRAMUSCULAR | Status: AC
  Administered 2017-12-01: 60 mg via INTRAMUSCULAR

## 2017-12-01 MED ORDER — OXYCODONE HCL 20 MG PO TABS: 1.0000 | ORAL_TABLET | Freq: Four times a day (QID) | ORAL | 0 refills | Status: DC

## 2017-12-01 NOTE — Progress Notes (Signed)
Safety precautions to be maintained throughout the outpatient stay will include: orient to surroundings, keep bed in low position, maintain call bell within reach at all times, provide assistance with transfer out of bed and ambulation.  Nursing Pain Medication Assessment:  Safety precautions to be maintained throughout the outpatient stay will include: orient to surroundings, keep bed in low position, maintain call bell within reach at all times, provide assistance with transfer out of bed and ambulation.  Medication Inspection Compliance: Pill count conducted under aseptic conditions, in front of the patient. Neither the pills nor the bottle was removed from the patient's sight at any time. Once count was completed pills were immediately returned to the patient in their original bottle.  Medication: Oxycodone HCL Pill/Patch Count: 60 of 120 pills remain Pill/Patch Appearance: Markings consistent with prescribed medication Bottle Appearance: Standard pharmacy container. Clearly labeled. Filled Date: 08 / 22 / 2019 Last Medication intake:  Today

## 2017-12-01 NOTE — Patient Instructions (Signed)
____________________________________________________________________________________________  Medication Rules  Applies to: All patients receiving prescriptions (written or electronic).  Pharmacy of record: Pharmacy where electronic prescriptions will be sent. If written prescriptions are taken to a different pharmacy, please inform the nursing staff. The pharmacy listed in the electronic medical record should be the one where you would like electronic prescriptions to be sent.  Prescription refills: Only during scheduled appointments. Applies to both, written and electronic prescriptions.  NOTE: The following applies primarily to controlled substances (Opioid* Pain Medications).   [name] responsibilities: 1. Pain Pills: Bring all pain pills to every appointment (except for procedure appointments). 2. Pill Bottles: Bring pills in original pharmacy bottle. Always bring newest bottle. Bring bottle, even if empty. 3. Medication refills: You are responsible for knowing and keeping track of what medications you need refilled. The day before your appointment, write a list of all prescriptions that need to be refilled. Bring that list to your appointment and give it to the admitting nurse. Prescriptions will be written only during appointments. If you forget a medication, it will not be "Called in", "Faxed", or "electronically sent". You will need to get another appointment to get these prescribed. 4. Prescription Accuracy: You are responsible for carefully inspecting your prescriptions before leaving our office. Have the discharge nurse carefully go over each prescription with you, before taking them home. Make sure that your name is accurately spelled, that your address is correct. Check the name and dose of your medication to make sure it is accurate. Check the number of pills, and the written instructions to make sure they are clear and accurate. Make sure that you are given enough medication to last  until your next medication refill appointment. 5. Taking Medication: Take medication as prescribed. Never take more pills than instructed. Never take medication more frequently than prescribed. Taking less pills or less frequently is permitted and encouraged, when it comes to controlled substances (written prescriptions).  6. Inform other Doctors: Always inform, all of your healthcare providers, of all the medications you take. 7. Pain Medication from other Providers: You are not allowed to accept any additional pain medication from any other Doctor or Healthcare provider. There are two exceptions to this rule. (see below) In the event that you require additional pain medication, you are responsible for notifying us, as stated below. 8. Medication Agreement: You are responsible for carefully reading and following our Medication Agreement. This must be signed before receiving any prescriptions from our practice. Safely store a copy of your signed Agreement. Violations to the Agreement will result in no further prescriptions. (Additional copies of our Medication Agreement are available upon request.) 9. Laws, Rules, & Regulations: All patients are expected to follow all Federal and State Laws, Statutes, Rules, & Regulations. Ignorance of the Laws does not constitute a valid excuse. The use of any illegal substances is prohibited. 10. Adopted CDC guidelines & recommendations: Target dosing levels will be at or below 60 MME/day. Use of benzodiazepines** is not recommended.  Exceptions: There are only two exceptions to the rule of not receiving pain medications from other Healthcare Providers. 1. Exception #1 (Emergencies): In the event of an emergency (i.e.: accident requiring emergency care), you are allowed to receive additional pain medication. However, you are responsible for: As soon as you are able, call our office [phone], at any time of the day or night, and leave a message stating your name, the  date and nature of the emergency, and the name and dose of the medication   prescribed. In the event that your call is answered by a member of our staff, make sure to document and save the date, time, and the name of the person that took your information.  2. Exception #2 (Planned Surgery): In the event that you are scheduled by another doctor or dentist to have any type of surgery or procedure, you are allowed (for a period no longer than 30 days), to receive additional pain medication, for the acute post-op pain. However, in this case, you are responsible for picking up a copy of our "Post-op Pain Management for Surgeons" handout, and giving it to your surgeon or dentist. This document is available at our office, and does not require an appointment to obtain it. Simply go to our office during business hours (Monday-Thursday from 8:00 AM to 4:00 PM) (Friday 8:00 AM to 12:00 Noon) or if you have a scheduled appointment with [place], prior to your surgery, and ask for it by name. In addition, you will need to provide [place] with your name, name of your surgeon, type of surgery, and date of procedure or surgery.  *Opioid medications include: morphine, codeine, oxycodone, oxymorphone, hydrocodone, hydromorphone, meperidine, tramadol, tapentadol, buprenorphine, fentanyl, methadone. **Benzodiazepine medications include: diazepam (Valium), alprazolam (Xanax), clonazepam (Klonopine), lorazepam (Ativan), clorazepate (Tranxene), chlordiazepoxide (Librium), estazolam (Prosom), oxazepam (Serax), temazepam (Restoril), triazolam (Halcion) (Last updated: 05/26/2017) ____________________________________________________________________________________________   ____________________________________________________________________________________________  CANNABIDIOL (AKA: CBD Oil or Pills)  Applies to: All patients receiving prescriptions of controlled substances (written and/or electronic).  General Information: Cannabidiol  (CBD) was discovered in [birth year]. It is one of some 113 identified cannabinoids in cannabis (Marijuana) plants, accounting for up to 40% of the plant's extract. As of 2018, preliminary clinical research on cannabidiol included studies of anxiety, cognition, movement disorders, and pain.  Cannabidiol is consummed in multiple ways, including inhalation of cannabis smoke or vapor, as an aerosol spray into the cheek, and by mouth. It may be supplied as CBD oil containing CBD as the active ingredient (no added tetrahydrocannabinol (THC) or terpenes), a full-plant CBD-dominant hemp extract oil, capsules, dried cannabis, or as a liquid solution. CBD is thought not have the same psychoactivity as THC, and may affect the actions of THC. Studies suggest that CBD may interact with different biological targets, including cannabinoid receptors and other neurotransmitter receptors. As of 2018 the mechanism of action for its biological effects has not been determined.  In the [place], cannabidiol has a limited approval by the Food and Drug Administration (FDA) for treatment of only two types of epilepsy disorders. The side effects of long-term use of the drug include somnolence, decreased appetite, diarrhea, fatigue, malaise, weakness, sleeping problems, and others.  CBD remains a Schedule I drug prohibited for any use.  Legality: Some manufacturers ship CBD products nationally, an illegal action which the FDA has not enforced in 2018, with CBD remaining the subject of an FDA investigational new drug evaluation, and is not considered legal as a dietary supplement or food ingredient as of December 2018. Federal illegality has made it difficult historically to conduct research on CBD. CBD is openly sold in head shops and health food stores in some states where such sales have not been explicitly legalized.  Warning: Because it is not FDA approved for general use or treatment of pain, it is not required to undergo the  same manufacturing controls as prescription drugs.  This means that the available cannabidiol (CBD) may be contaminated with THC.  If this is the case, it will trigger a  positive urine drug screen (UDS) test for cannabinoids (Marijuana).  Because a positive UDS for illicit substances is a violation of our medication agreement, your opioid analgesics (pain medicine) may be permanently discontinued. (Last update: 06/16/2017) ____________________________________________________________________________________________

## 2017-12-01 NOTE — Progress Notes (Signed)
[name] Name: Audrey Garrett  MRN: [id number]  Referring Provider: Jodi Marble, MD  DOB: Oct 29, 1960  PCP: Jodi Marble, MD  DOS: 12/01/2017  Note by: Vevelyn Francois NP  Service setting: Ambulatory outpatient  Specialty: Interventional Pain Management  Location: [facility] (AMB) Pain Management Facility    Patient type: Established    Primary Reason(s) for Visit: Encounter for prescription drug management. (Level of risk: moderate)  CC: Medication Refill (Oxycodone HCL 64m)  HPI  Ms. FCupplesis a 57y.o. year old, female patient, who comes today for a medication management evaluation. She has Insomnia, persistent; BP (high blood pressure); Cervical radicular pain; Uncomplicated opioid dependence (HG[place]; Long term prescription opiate use; Failed back surgical syndrome; Lumbar facet syndrome (Bilateral) (L>R); Osteoarthritis of spine with radiculopathy, lumbar region; Musculoskeletal pain; Chronic low back pain (Primary Source of Pain) (Bilateral) (L>R); HCV (hepatitis C virus); At risk for osteopenia; Lumbar spondylosis; Chronic lumbar radicular pain (Secondary source of pain) (Left); Chronic neck pain; Chronic lower extremity pain (Left); Chronic sacroiliac joint pain (Bilateral) (R>L); Chronic upper back pain; Long term current use of opiate analgesic; Encounter for chronic pain management; Hypomagnesemia (low magnesium levels); Hypotension; Anemia; Thrombocytopenia ([place]; Coagulopathy ([place]; Hyperglycemia; Polyneuropathy; Urinary retention; Difficulty urinating; Nephrolithiasis; Alcoholic cirrhosis of liver with ascites ([place]; Depression, major, recurrent, moderate ([place]; Abdominal pain, chronic, epigastric; Hepatic cirrhosis ([place]; Acute GI bleeding; Neurogenic pain; S/P thoracolumbar fusion (T5-L5); Grade 1 Retrolisthesis (4 mm) of C5 over C6 and C6 over C7; Cervical foraminal stenosis (Left C5-6; Bilateral C6-7); Hernia of anterior abdominal wall; Incarcerated umbilical hernia; Controlled type 2  diabetes mellitus with hyperglycemia ([place]; Acute blood loss anemia; Chronic pain syndrome; Malnutrition of moderate degree; Iron deficiency anemia due to chronic blood loss; Opiate use; RLS (restless legs syndrome); Bilateral leg edema; Depression, major, in remission ([place]; Chronic hip pain (Bilateral); Osteoarthritis of hip (Bilateral); Personal history of tobacco use, presenting hazards to health; Chronic groin pain, left; Malignant neoplasm of upper lobe of right lung ([place]; Acute postoperative pain; Chronic hepatitis C without hepatic coma (HCC); and Cirrhosis of liver with ascites (HCC) on their problem list. Her primarily concern today is the Medication Refill (Oxycodone HCL 227m  Pain Assessment: Location: Lower, Right, Left Back Radiating: Radiates from lower back to mid back. Also radiates from low back to front of thighs.  Onset: More than a month ago Duration: Chronic pain Quality: Constant, Aching, Dull Severity: 4 /10 (subjective, self-reported pain score)  Note: Reported level is compatible with observation.                          Effect on ADL: "Limites my everyday activity. Takes me longer to change clothers, shower"  Timing: Constant Modifying factors: "Medications, sitting in recliner" BP: 101/72  HR: 86  [name] last scheduled for an appointment on 08/31/2017 for medication management. During today's appointment we reviewed Audrey Garrett's chronic pain status, as well as her outpatient medication regimen. She admits that she is "twisting everyday". She feels like her upper back is getting tighter.  She feels like it comes and goes. She feels like this is newer. She is not sure if this is messing with her neck. She denies neck pain just soreness. She does not want to do any injections for her neck. She is fearful of this. She admits that she is not been able to stand up well from a seated position.  She admits that her previous surgery for  scoliosis was effective I have she  is she is concerned about the problems that she is having now.  Is fearful that the hardware will start break over time.  The patient  reports that she does not use drugs. Her body mass index is 25.11 kg/m.  Further details on both, my assessment(s), as well as the proposed treatment plan, please see below.  Controlled Substance Pharmacotherapy Assessment REMS (Risk Evaluation and Mitigation Strategy)  Analgesic:Oxycodone IR 20 mg every 6 (80 mg/day) MME/day:120 mg/day.  [name], RN  12/01/2017 10:45 AM  Sign at close encounter Safety precautions to be maintained throughout the outpatient stay will include: orient to surroundings, keep bed in low position, maintain call bell within reach at all times, provide assistance with transfer out of bed and ambulation.  Nursing Pain Medication Assessment:  Safety precautions to be maintained throughout the outpatient stay will include: orient to surroundings, keep bed in low position, maintain call bell within reach at all times, provide assistance with transfer out of bed and ambulation.  Medication Inspection Compliance: Pill count conducted under aseptic conditions, in front of the patient. Neither the pills nor the bottle was removed from the patient's sight at any time. Once count was completed pills were immediately returned to the patient in their original bottle.  Medication: Oxycodone HCL Pill/Patch Count: 60 of 120 pills remain Pill/Patch Appearance: Markings consistent with prescribed medication Bottle Appearance: Standard pharmacy container. Clearly labeled. Filled Date: 08 / 22 / 2019 Last Medication intake:  Today   Pharmacokinetics: Liberation and absorption (onset of action): WNL Distribution (time to peak effect): WNL Metabolism and excretion (duration of action): WNL         Pharmacodynamics: Desired effects: Analgesia: Ms. Schoch reports >50% benefit. Functional ability: Patient reports that medication allows her to  accomplish basic ADLs Clinically meaningful improvement in function (CMIF): Sustained CMIF goals met Perceived effectiveness: Described as relatively effective, allowing for increase in activities of daily living (ADL) Undesirable effects: Side-effects or Adverse reactions: None reported Monitoring: [place] PMP: Online review of the past 3-monthperiod conducted. Compliant with practice rules and regulations Last UDS on record: Summary  Date Value Ref Range Status  05/04/2017 FINAL  Final    Comment:    ==================================================================== TOXASSURE SELECT 13 (MW) ==================================================================== Test                             Result       Flag       Units Drug Present and Declared for Prescription Verification   Oxycodone                      5582         EXPECTED   ng/mg creat   Oxymorphone                    492          EXPECTED   ng/mg creat   Noroxycodone                   >9804        EXPECTED   ng/mg creat   Noroxymorphone                 307          EXPECTED   ng/mg creat    Sources of oxycodone are scheduled prescription medications.  Oxymorphone, noroxycodone, and noroxymorphone are expected    metabolites of oxycodone. Oxymorphone is also available as a    scheduled prescription medication. ==================================================================== Test                      Result    Flag   Units      Ref Range   Creatinine              102              mg/dL      >=20 ==================================================================== Declared Medications:  The flagging and interpretation on this report are based on the  following declared medications.  Unexpected results may arise from  inaccuracies in the declared medications.  **Note: The testing scope of this panel includes these medications:  Oxycodone  **Note: The testing scope of this panel does not include following  reported  medications:  Furosemide (Lasix)  Lactulose  Linagliptin (Tradjenta)  Magnesium (Mag-Ox)  Metformin (Glucophage)  Mupirocin (Bactroban)  Pantoprazole (Protonix)  Pregabalin (Lyrica)  Spironolactone (Aldactone)  Vitamin B1 ==================================================================== For clinical consultation, please call [phone]. ====================================================================    UDS interpretation: Compliant          Medication Assessment Form: Reviewed. Patient indicates being compliant with therapy Treatment compliance: Compliant Risk Assessment Profile: Aberrant behavior: See prior evaluations. None observed or detected today Comorbid factors increasing risk of overdose: See prior notes. No additional risks detected today Opioid risk tool (ORT) (Total Score): 5 Personal History of Substance Abuse (SUD-Substance use disorder):  Alcohol: Negative  Illegal Drugs: Positive Female or Female  Rx Drugs: Negative  ORT Risk Level calculation: Moderate Risk Risk of substance use disorder (SUD): Low Opioid Risk Tool - 12/01/17 1056      Family History of Substance Abuse   Alcohol  Negative    Illegal Drugs  Negative    Rx Drugs  Negative      Personal History of Substance Abuse   Alcohol  Negative    Illegal Drugs  Positive Female or Female    Rx Drugs  Negative      Age   Age between 43-45 years   No      History of Preadolescent Sexual Abuse   History of Preadolescent Sexual Abuse  Negative or Female      Psychological Disease   Psychological Disease  Negative    Depression  Positive      Total Score   Opioid Risk Tool Scoring  5    Opioid Risk Interpretation  Moderate Risk      ORT Scoring interpretation table:  Score <3 = Low Risk for SUD  Score between 4-7 = Moderate Risk for SUD  Score >8 = High Risk for Opioid Abuse   Risk Mitigation Strategies:  Patient Counseling: Covered Patient-Prescriber Agreement (PPA): Present and  active  Notification to other healthcare providers: Done  Pharmacologic Plan: No change in therapy, at this time.             Laboratory Chemistry  Inflammation Markers (CRP: Acute Phase) (ESR: Chronic Phase) Lab Results  Component Value Date   CRP <0.5 04/03/2015   ESRSEDRATE 27 04/03/2015   LATICACIDVEN 2.7 ([place]) 04/23/2017                         Rheumatology Markers No results found for: RF, ANA, [place], [place], [place], [place], HLAB27  Renal Function Markers Lab Results  Component Value Date   BUN 11 04/23/2017   CREATININE 0.60 09/30/2017   GFRAA >60 04/23/2017   GFRNONAA >60 04/23/2017                             Hepatic Function Markers Lab Results  Component Value Date   AST 68 (H) 04/23/2017   ALT 35 04/23/2017   ALBUMIN 4.2 04/23/2017   ALKPHOS 90 04/23/2017   LIPASE 32 11/06/2015   AMMONIA 12 09/14/2016                        Electrolytes Lab Results  Component Value Date   NA 130 (L) 04/23/2017   K 4.0 04/23/2017   CL 93 (L) 04/23/2017   CALCIUM 8.8 (L) 04/23/2017   MG 1.9 01/22/2016   PHOS 3.6 11/23/2013                        Neuropathy Markers Lab Results  Component Value Date   VITAMINB12 676 10/04/2017   FOLATE 7.8 10/04/2017   HGBA1C 5.3 05/22/2015                        CNS Tests Lab Results  Component Value Date   SDES FLUID PERITONEAL 10/10/2015   SDES FLUID PERITONEAL 10/10/2015   GRAMSTAIN  10/10/2015    FEW WBC PRESENT, PREDOMINANTLY PMN NO ORGANISMS SEEN Performed at [place]  10/10/2015    NO GROWTH 5 DAYS Performed at [facility]                         Bone Pathology Markers No results found for: Marveen Reeks, [id number], HK7425ZD6, 25OHVITD1, 25OHVITD2, 25OHVITD3, TESTOFREE, TESTOSTERONE                       Coagulation Parameters Lab Results  Component Value Date   INR 1.16 08/24/2017   LABPROT 14.7 08/24/2017   APTT 35 08/24/2017   PLT 174  09/28/2017                        Cardiovascular Markers Lab Results  Component Value Date   BNP 929 (H) 09/23/2013   CKTOTAL 1,597 (H) 12/17/2012   TROPONINI <0.03 05/07/2015   HGB 12.9 09/28/2017   HCT 36.5 09/28/2017                         CA Markers No results found for: CEA, CA125, LABCA2                      Note: Lab results reviewed.  Recent Diagnostic Imaging Results  IR Radiologist Eval & Mgmt Please refer to notes tab for details about interventional procedure. (Op  Note)  Complexity Note: Imaging results reviewed. Results shared with Ms. Dorsi, using [employer].                         Meds   Current Outpatient Medications:  .  escitalopram (LEXAPRO) 10 MG tablet, TAKE 1 TABLET EVERY DAY EVERY MORNING, Disp: , Rfl:  .  furosemide (LASIX) 40 MG tablet, Take 40 mg by mouth 2 (two) times daily. , Disp: ,  Rfl:  .  lactulose (CHRONULAC) 10 GM/15ML solution, Take 10 g by mouth 3 (three) times daily as needed for mild constipation or moderate constipation. , Disp: , Rfl:  .  LYRICA 150 MG capsule, Take 150 mg by mouth 2 (two) times daily., Disp: , Rfl: 2 .  metFORMIN (GLUCOPHAGE) 500 MG tablet, Take 500 mg by mouth 3 (three) times daily after meals., Disp: , Rfl:  .  Multiple Vitamins-Minerals (CENTRUM SILVER 50+WOMEN PO), Take 1 tablet by mouth daily., Disp: , Rfl:  .  [START ON 02/15/2018] Oxycodone HCl 20 MG TABS, Take 1 tablet (20 mg total) by mouth every 6 (six) hours., Disp: 120 tablet, Rfl: 0 .  pantoprazole (PROTONIX) 40 MG tablet, Take 40 mg by mouth twice daily., Disp: , Rfl: 11 .  spironolactone (ALDACTONE) 50 MG tablet, TAKE 2 TABLETS BY MOUTH EVERY DAY, Disp: , Rfl:  .  thiamine (VITAMIN B-1) 100 MG tablet, Take 1 tablet (100 mg total) by mouth daily., Disp: 90 tablet, Rfl: 6 .  cyclobenzaprine (FLEXERIL) 5 MG tablet, Take 1 tablet (5 mg total) by mouth 3 (three) times daily as needed for muscle spasms., Disp: 90 tablet, Rfl: 2 .  [START ON 01/16/2018]  Oxycodone HCl 20 MG TABS, Take 1 tablet (20 mg total) by mouth every 6 (six) hours as needed., Disp: 120 tablet, Rfl: 0 .  [START ON 12/17/2017] Oxycodone HCl 20 MG TABS, Take 1 tablet (20 mg total) by mouth every 6 (six) hours as needed., Disp: 120 tablet, Rfl: 0  ROS  Constitutional: Denies any fever or chills Gastrointestinal: No reported hemesis, hematochezia, vomiting, or acute GI distress Musculoskeletal: Denies any acute onset joint swelling, redness, loss of ROM, or weakness Neurological: No reported episodes of acute onset apraxia, aphasia, dysarthria, agnosia, amnesia, paralysis, loss of coordination, or loss of consciousness  Allergies  Ms. Majer has No Known Allergies.  PFSH  Drug: Ms. Kubicek  reports that she does not use drugs. Alcohol:  reports that she does not drink alcohol. Tobacco:  reports that she has been smoking cigarettes. She has a 42.00 pack-year smoking history. She has never used smokeless tobacco. Medical:  has a past medical history of Alcohol abuse, Alcoholic cirrhosis of liver without ascites ([place]), Anemia, Anxiety, Arthropathy, Back pain, Bronchitis, BRONCHITIS, ACUTE (11/01/2009), Chronic hepatitis C ([place]), Chronic LBP (12/25/2014), Cirrhosis ([place]), DDD (degenerative disc disease), lumbar (01/01/2015), Depression, Diabetes mellitus without complication ([place]), Diverticulosis, Edema, Fatigue, Fibromyalgia, GERD (gastroesophageal reflux disease), High risk medications (not anticoagulants) long-term use, Hyperglycemia, Hypertension, Kidney mass, Left leg pain (01/01/2015), Lumbago, Lumbar radicular pain (01/01/2015), Muscle spasm, Neck pain (01/01/2015), Polyarthritis, Reflux, Restless leg syndrome, RLS (restless legs syndrome), Sacroiliac pain (01/01/2015), Shoulder pain, Sinusitis, SOB (shortness of breath), Spine disorder, Stress headaches, Upper back pain (01/01/2015), Urinary frequency, and Vitamin D deficiency disease. Surgical: Ms. Heng  has a past surgical history that  includes Tubal ligation; Tonsillectomy; Back surgery (12/19/2011); Esophagogastroduodenoscopy (N/A, 04/14/2015); Esophagogastroduodenoscopy (egd) with propofol (N/A, 09/16/2015); Colonoscopy with propofol (N/A, 09/25/2015); Umbilical hernia repair (N/A, 11/06/2015); Hernia repair (N/A, 10/2015); Esophagogastroduodenoscopy (egd) with propofol (N/A, 01/22/2016); Back surgery; Colonoscopy with propofol (N/A, 04/01/2016); Esophagogastroduodenoscopy (egd) with propofol (N/A, 04/01/2016); Electormagnetic navigation bronchoscopy (N/A, 11/25/2016); IR Radiologist Eval & Mgmt (04/13/2017); and IR Radiologist Eval & Mgmt (11/15/2017). Family: family history includes [name] in her father; Breast cancer (age of onset: 48) in her sister; Heart disease in her mother; Stroke in her mother.  Constitutional Exam  General appearance: Well nourished, well developed, and well hydrated.  In no apparent acute distress Vitals:   12/01/17 1046  BP: 101/72  Pulse: 86  Resp: 18  Temp: 98.4 F (36.9 C)  SpO2: 99%  Weight: 175 lb (79.4 kg)  Height: 5' 10" (1.778 m)  Psych/Mental status: Alert, oriented x 3 (person, place, & time)      Tearful when discussed her previous depression Eyes: PERLA Respiratory: No evidence of acute respiratory distress  Cervical Spine Area Exam  Skin & Axial Inspection: No masses, redness, edema, swelling, or associated skin lesions Alignment: Symmetrical Functional ROM: Unrestricted ROM      Stability: No instability detected Muscle Tone/Strength: Functionally intact. No obvious neuro-muscular anomalies detected. Sensory (Neurological): Unimpaired Palpation: Tender              Upper Extremity (UE) Exam    Side: Right upper extremity  Side: Left upper extremity  Skin & Extremity Inspection: Skin color, temperature, and hair growth are WNL. No peripheral edema or cyanosis. No masses, redness, swelling, asymmetry, or associated skin lesions. No contractures.  Skin & Extremity Inspection: Skin  color, temperature, and hair growth are WNL. No peripheral edema or cyanosis. No masses, redness, swelling, asymmetry, or associated skin lesions. No contractures.  Functional ROM: Adequate ROM          Functional ROM: Adequate ROM          Muscle Tone/Strength: Functionally intact. No obvious neuro-muscular anomalies detected.  Muscle Tone/Strength: Functionally intact. No obvious neuro-muscular anomalies detected.  Sensory (Neurological): Unimpaired          Sensory (Neurological): Unimpaired          Palpation: No palpable anomalies              Palpation: No palpable anomalies              Provocative Test(s):  Phalen's test: deferred Tinel's test: deferred Apley's scratch test (touch opposite shoulder):  Action 1 (Across chest): deferred Action 2 (Overhead): deferred Action 3 (LB reach): deferred   Provocative Test(s):  Phalen's test: deferred Tinel's test: deferred Apley's scratch test (touch opposite shoulder):  Action 1 (Across chest): deferred Action 2 (Overhead): deferred Action 3 (LB reach): deferred    Thoracic Spine Area Exam  Skin & Axial Inspection: Well healed scar from previous spine surgery detected Alignment: Symmetrical Functional ROM: Unrestricted ROM Stability: No instability detected Muscle Tone/Strength: Functionally intact. No obvious neuro-muscular anomalies detected. Sensory (Neurological): Unimpaired Muscle strength & Tone: No palpable anomalies  Lumbar Spine Area Exam  Skin & Axial Inspection: Well healed scar from previous spine surgery detected Alignment: Symmetrical Functional ROM: Unrestricted ROM       Stability: No instability detected Muscle Tone/Strength: Functionally intact. No obvious neuro-muscular anomalies detected. Sensory (Neurological): Unimpaired Palpation: No palpable anomalies       Provocative Tests: Hyperextension/rotation test: deferred today       Lumbar quadrant test (Kemp's test): deferred today       Lateral bending test:  deferred today       Patrick's Maneuver: deferred today                   FABER test: deferred today                   S-I anterior distraction/compression test: deferred today         S-I lateral compression test: deferred today         S-I Thigh-thrust test: deferred today  S-I Gaenslen's test: deferred today          Gait & Posture Assessment  Ambulation: Unassisted Gait: Relatively normal for age and body habitus Posture: WNL   Lower Extremity Exam    Side: Right lower extremity  Side: Left lower extremity  Stability: No instability observed          Stability: No instability observed          Skin & Extremity Inspection: Edema with gauze and tape to right great toe second toe  Skin & Extremity Inspection: Edema  Functional ROM: Unrestricted ROM                  Functional ROM: Unrestricted ROM                  Muscle Tone/Strength: Functionally intact. No obvious neuro-muscular anomalies detected.  Muscle Tone/Strength: Functionally intact. No obvious neuro-muscular anomalies detected.  Sensory (Neurological): Unimpaired  Sensory (Neurological): Unimpaired  Palpation: No palpable anomalies  Palpation: No palpable anomalies   Assessment  Primary Diagnosis & Pertinent Problem List: The primary encounter diagnosis was Lumbar spondylosis. Diagnoses of Chronic upper back pain, Chronic neck pain, Chronic pain syndrome, Musculoskeletal pain, and Long term prescription opiate use were also pertinent to this visit.  Status Diagnosis  Persistent Persistent Worsening 1. Lumbar spondylosis   2. Chronic upper back pain   3. Chronic neck pain   4. Chronic pain syndrome   5. Musculoskeletal pain   6. Long term prescription opiate use     Problems updated and reviewed during this visit: No problems updated. Plan of Care  Pharmacotherapy (Medications Ordered): Meds ordered this encounter  Medications  . Oxycodone HCl 20 MG TABS    Sig: Take 1 tablet (20 mg total) by mouth  every 6 (six) hours.    Dispense:  120 tablet    Refill:  0    Fill one day early if pharmacy is closed on scheduled refill date. Do not fill until:02/15/2018 To last until:03/17/2018    Order Specific Question:   Supervising Provider    Answer:   [name] [phone]  . Oxycodone HCl 20 MG TABS    Sig: Take 1 tablet (20 mg total) by mouth every 6 (six) hours as needed.    Dispense:  120 tablet    Refill:  0    Patient may have prescription filled one day early if pharmacy is closed on scheduled refill date. Do not fill until:01/16/2018 To last until:02/15/2018    Order Specific Question:   Supervising Provider    Answer:   [name] [phone]  . Oxycodone HCl 20 MG TABS    Sig: Take 1 tablet (20 mg total) by mouth every 6 (six) hours as needed.    Dispense:  120 tablet    Refill:  0    Patient may have prescription filled one day early if pharmacy is closed on scheduled refill date. Do not fill until: 12/17/2017 To last until: 01/16/2018    Order Specific Question:   Supervising Provider    Answer:   [name] [phone]  . ketorolac (TORADOL) injection 60 mg  . orphenadrine (NORFLEX) injection 60 mg  . cyclobenzaprine (FLEXERIL) 5 MG tablet    Sig: Take 1 tablet (5 mg total) by mouth 3 (three) times daily as needed for muscle spasms.    Dispense:  90 tablet    Refill:  2    Do not place this medication, or any other  prescription from our practice, on "Automatic Refill". Patient may have prescription filled one day early if pharmacy is closed on scheduled refill date.    Order Specific Question:   Supervising Provider    Answer:   [name] [phone]   New Prescriptions   CYCLOBENZAPRINE (FLEXERIL) 5 MG TABLET    Take 1 tablet (5 mg total) by mouth 3 (three) times daily as needed for muscle spasms.   Medications administered today: We administered ketorolac and orphenadrine. Lab-work, procedure(s), and/or referral(s): Orders Placed This Encounter   Procedures  . ToxASSURE Select 13 (MW), Urine   Imaging and/or referral(s): None Interventional therapies: Planned, scheduled, and/or pending: Not at this time   Considering:  Diagnostic bilateral lumbar facetblock  Possible bilateral lumbar facet RFA Diagnostic left caudal Epiduralsteroid injection + diagnostic epidurogram Possible Racz procedure Diagnostic bilateral sacroiliacjoint injection  Possible bilateral sacroiliac joint RFA Diagnostic left cervical epiduralsteroid injection  Diagnostic bilateral cervical facetblock  Possible bilateral cervical facet RFA   Palliative PRN treatment(s):  Diagnostic bilateral lumbar facet blockunder fluoroscopic guidance and IV sedation  Diagnostic bilateral intra-articular hipjoint injection under fluoroscopic guidance and IV sedation  Diagnostic bilateral sacroiliacjoint block under fluoroscopic guidance and IV sedation     Provider-requested follow-up: Return in about 3 months (around 03/02/2018) for MedMgmt with Me Donella Stade Edison Pace).  Future Appointments  Date Time Provider [place]  12/05/2017 10:30 AM [facility] PAT 3 [facility] None  12/14/2017  8:30 AM [facility]CT 1 [facility] [place]  01/31/2018 10:00 AM [facility]  02/02/2018  2:00 PM [facility] None  02/02/2018  2:30 PM Sindy Guadeloupe, MD [facility] None  02/27/2018 10:30 AM Vevelyn Francois, NP [facility] None   Primary Care Physician: Jodi Marble, MD Location: [facility] Note by: Vevelyn Francois NP Date: 12/01/2017; Time: 3:39 PM  Pain Score Disclaimer: We use the NRS-11 scale. This is a self-reported, subjective measurement of pain severity with only modest accuracy. It is used primarily to identify changes within a particular patient. It must be understood that outpatient pain scales are significantly less accurate that those used for research, where they can be applied under ideal controlled  circumstances with minimal exposure to variables. In reality, the score is likely to be a combination of pain intensity and pain affect, where pain affect describes the degree of emotional arousal or changes in action readiness caused by the sensory experience of pain. Factors such as social and work situation, setting, emotional state, anxiety levels, expectation, and prior pain experience may influence pain perception and show large inter-individual differences that may also be affected by time variables.  Patient instructions provided during this appointment: Patient Instructions  ____________________________________________________________________________________________  Medication Rules  Applies to: All patients receiving prescriptions (written or electronic).  Pharmacy of record: Pharmacy where electronic prescriptions will be sent. If written prescriptions are taken to a different pharmacy, please inform the nursing staff. The pharmacy listed in the electronic medical record should be the one where you would like electronic prescriptions to be sent.  Prescription refills: Only during scheduled appointments. Applies to both, written and electronic prescriptions.  NOTE: The following applies primarily to controlled substances (Opioid* Pain Medications).   [name] responsibilities: 1. Pain Pills: Bring all pain pills to every appointment (except for procedure appointments). 2. Pill Bottles: Bring pills in original pharmacy bottle. Always bring newest bottle. Bring bottle, even if empty. 3. Medication refills: You are responsible for knowing and keeping track of what medications you  need refilled. The day before your appointment, write a list of all prescriptions that need to be refilled. Bring that list to your appointment and give it to the admitting nurse. Prescriptions will be written only during appointments. If you forget a medication, it will not be "Called in", "Faxed", or  "electronically sent". You will need to get another appointment to get these prescribed. 4. Prescription Accuracy: You are responsible for carefully inspecting your prescriptions before leaving our office. Have the discharge nurse carefully go over each prescription with you, before taking them home. Make sure that your name is accurately spelled, that your address is correct. Check the name and dose of your medication to make sure it is accurate. Check the number of pills, and the written instructions to make sure they are clear and accurate. Make sure that you are given enough medication to last until your next medication refill appointment. 5. Taking Medication: Take medication as prescribed. Never take more pills than instructed. Never take medication more frequently than prescribed. Taking less pills or less frequently is permitted and encouraged, when it comes to controlled substances (written prescriptions).  6. Inform other Doctors: Always inform, all of your healthcare providers, of all the medications you take. 7. Pain Medication from other Providers: You are not allowed to accept any additional pain medication from any other Doctor or Healthcare provider. There are two exceptions to this rule. (see below) In the event that you require additional pain medication, you are responsible for notifying us, as stated below. 8. Medication Agreement: You are responsible for carefully reading and following our Medication Agreement. This must be signed before receiving any prescriptions from our practice. Safely store a copy of your signed Agreement. Violations to the Agreement will result in no further prescriptions. (Additional copies of our Medication Agreement are available upon request.) 9. Laws, Rules, & Regulations: All patients are expected to follow all Federal and [employer], [employer], Rules, [employer]. Ignorance of the Laws does not constitute a valid excuse. The use of any illegal substances is  prohibited. 10. Adopted CDC guidelines & recommendations: Target dosing levels will be at or below 60 MME/day. Use of benzodiazepines** is not recommended.  Exceptions: There are only two exceptions to the rule of not receiving pain medications from other Healthcare Providers. 1. Exception #1 (Emergencies): In the event of an emergency (i.e.: accident requiring emergency care), you are allowed to receive additional pain medication. However, you are responsible for: As soon as you are able, call our office [phone], at any time of the day or night, and leave a message stating your name, the date and nature of the emergency, and the name and dose of the medication prescribed. In the event that your call is answered by a member of our staff, make sure to document and save the date, time, and the name of the person that took your information.  2. Exception #2 (Planned Surgery): In the event that you are scheduled by another doctor or dentist to have any type of surgery or procedure, you are allowed (for a period no longer than 30 days), to receive additional pain medication, for the acute post-op pain. However, in this case, you are responsible for picking up a copy of our "Post-op Pain Management for Surgeons" handout, and giving it to your surgeon or dentist. This document is available at our office, and does not require an appointment to obtain it. Simply go to our office during business hours (Monday-Thursday from 8:00 AM  to 4:00 PM) (Friday 8:00 AM to 12:00 Noon) or if you have a scheduled appointment with [place], prior to your surgery, and ask for it by name. In addition, you will need to provide [place] with your name, name of your surgeon, type of surgery, and date of procedure or surgery.  *Opioid medications include: morphine, codeine, oxycodone, oxymorphone, hydrocodone, hydromorphone, meperidine, tramadol, tapentadol, buprenorphine, fentanyl, methadone. **Benzodiazepine medications include: diazepam  (Valium), alprazolam (Xanax), clonazepam (Klonopine), lorazepam (Ativan), clorazepate (Tranxene), chlordiazepoxide (Librium), estazolam (Prosom), oxazepam (Serax), temazepam (Restoril), triazolam (Halcion) (Last updated: 05/26/2017) ____________________________________________________________________________________________   ____________________________________________________________________________________________  CANNABIDIOL (AKA: CBD Oil or Pills)  Applies to: All patients receiving prescriptions of controlled substances (written and/or electronic).  General Information: Cannabidiol (CBD) was discovered in 18. It is one of some 113 identified cannabinoids in cannabis (Marijuana) plants, accounting for up to 40% of the plant's extract. As of 2018, preliminary clinical research on cannabidiol included studies of anxiety, cognition, movement disorders, and pain.  Cannabidiol is consummed in multiple ways, including inhalation of cannabis smoke or vapor, as an aerosol spray into the cheek, and by mouth. It may be supplied as CBD oil containing CBD as the active ingredient (no added tetrahydrocannabinol (THC) or terpenes), a full-plant CBD-dominant hemp extract oil, capsules, dried cannabis, or as a liquid solution. CBD is thought not have the same psychoactivity as THC, and may affect the actions of THC. Studies suggest that CBD may interact with different biological targets, including cannabinoid receptors and other neurotransmitter receptors. As of 2018 the mechanism of action for its biological effects has not been determined.  In the [place], cannabidiol has a limited approval by the Food and Drug Administration (FDA) for treatment of only two types of epilepsy disorders. The side effects of long-term use of the drug include somnolence, decreased appetite, diarrhea, fatigue, malaise, weakness, sleeping problems, and others.  CBD remains a Schedule I drug prohibited for any  use.  Legality: Some manufacturers ship CBD products nationally, an illegal action which the FDA has not enforced in 2018, with CBD remaining the subject of an FDA investigational new drug evaluation, and is not considered legal as a dietary supplement or food ingredient as of December 2018. Federal illegality has made it difficult historically to conduct research on CBD. CBD is openly sold in head shops and health food stores in some states where such sales have not been explicitly legalized.  Warning: Because it is not FDA approved for general use or treatment of pain, it is not required to undergo the same manufacturing controls as prescription drugs.  This means that the available cannabidiol (CBD) may be contaminated with THC.  If this is the case, it will trigger a positive urine drug screen (UDS) test for cannabinoids (Marijuana).  Because a positive UDS for illicit substances is a violation of our medication agreement, your opioid analgesics (pain medicine) may be permanently discontinued. (Last update: 06/16/2017) ____________________________________________________________________________________________

## 2017-12-02 ENCOUNTER — Encounter (HOSPITAL_COMMUNITY): Payer: Self-pay

## 2017-12-02 NOTE — Patient Instructions (Signed)
Audrey Garrett  DOB  May 04, 1960   Your procedure is scheduled on:  12-14-2017   Report to [facility] Main  Entrance,  Report to admitting at  6:45 AM    Call this number if you have problems the morning of surgery [phone]                Remember: Do not eat food or drink liquids :After Midnight.     Take these medicines the morning of surgery with A SIP OF WATER:  LYRICA, ESCITALOPRAM(LEXAPRO), PANTOPRAZOLE(PROTONIX), OXYCODONE AND  CYCLOBENZAPRINE IF NEEDED   DO NOT TAKE ANY DIABETIC MEDICATIONS DAY OF YOUR SURGERY                               You may not have any metal on your body including hair pins and              piercings                            Do not wear jewelry, make-up, lotions, powders or perfumes, deodorant                          Do not wear nail polish.  Do not shave  48 hours prior to surgery.               Do not bring valuables to the hospital. [place].  Contacts, dentures or bridgework may not be worn into surgery.  Leave suitcase in the car. After surgery it may be brought to your room.   Special Instructions: N/A              Please read over the following fact sheets you were given: _____________________________________________________________________             [facility] - Preparing for Surgery Before surgery, you can play an important role.  Because skin is not sterile, your skin needs to be as free of germs as possible.  You can reduce the number of germs on your skin by washing with CHG (chlorahexidine gluconate) soap before surgery.  CHG is an antiseptic cleaner which kills germs and bonds with the skin to continue killing germs even after washing. Please DO NOT use if you have an allergy to CHG or antibacterial soaps.  If your skin becomes reddened/irritated stop using the CHG and inform your nurse when you arrive at Short Stay. Do not shave (including legs  and underarms) for at least 48 hours prior to the first CHG shower.  You may shave your face/neck. Please follow these instructions carefully:  1.  Shower with CHG Soap the night before surgery and the  morning of Surgery.  2.  If you choose to wash your hair, wash your hair first as usual with your  normal  shampoo.  3.  After you shampoo, rinse your hair and body thoroughly to remove the  shampoo.                            4.  Use CHG as you would any other liquid soap.  You can apply chg directly  to the skin and wash                       Gently with a scrungie or clean washcloth.  5.  Apply the CHG Soap to your body ONLY FROM THE NECK DOWN.   Do not use on face/ open                           Wound or open sores. Avoid contact with eyes, ears mouth and genitals (private parts).                       Wash face,  Genitals (private parts) with your normal soap.             6.  Wash thoroughly, paying special attention to the area where your surgery  will be performed.  7.  Thoroughly rinse your body with warm water from the neck down.  8.  DO NOT shower/wash with your normal soap after using and rinsing off  the CHG Soap.             9.  Pat yourself dry with a clean towel.            10.  Wear clean pajamas.            11.  Place clean sheets on your bed the night of your first shower and do not  sleep with pets. Day of Surgery : Do not apply any lotions/deodorants the morning of surgery.  Please wear clean clothes to the hospital/surgery center.  FAILURE TO FOLLOW THESE INSTRUCTIONS MAY RESULT IN THE CANCELLATION OF YOUR SURGERY PATIENT SIGNATURE_________________________________  NURSE SIGNATURE__________________________________  ________________________________________________________________________

## 2017-12-05 ENCOUNTER — Other Ambulatory Visit: Payer: Self-pay

## 2017-12-05 ENCOUNTER — Encounter (HOSPITAL_COMMUNITY): Payer: Self-pay

## 2017-12-05 ENCOUNTER — Encounter (HOSPITAL_COMMUNITY)
Admission: RE | Admit: 2017-12-05 | Discharge: 2017-12-05 | Disposition: A | Discharge status: Home / Self Care | Payer: Medicare Other | Source: Ambulatory Visit | Attending: Diagnostic Radiology | Admitting: Diagnostic Radiology

## 2017-12-05 DIAGNOSIS — R9431 Abnormal electrocardiogram [ECG] [EKG]: Secondary | ICD-10-CM | POA: Insufficient documentation

## 2017-12-05 DIAGNOSIS — D500 Iron deficiency anemia secondary to blood loss (chronic): Secondary | ICD-10-CM | POA: Insufficient documentation

## 2017-12-05 DIAGNOSIS — I10 Essential (primary) hypertension: Secondary | ICD-10-CM | POA: Diagnosis not present

## 2017-12-05 DIAGNOSIS — K7030 Alcoholic cirrhosis of liver without ascites: Secondary | ICD-10-CM | POA: Insufficient documentation

## 2017-12-05 DIAGNOSIS — E119 Type 2 diabetes mellitus without complications: Secondary | ICD-10-CM | POA: Diagnosis not present

## 2017-12-05 DIAGNOSIS — Z01812 Encounter for preprocedural laboratory examination: Secondary | ICD-10-CM | POA: Insufficient documentation

## 2017-12-05 DIAGNOSIS — K219 Gastro-esophageal reflux disease without esophagitis: Secondary | ICD-10-CM | POA: Diagnosis not present

## 2017-12-05 DIAGNOSIS — C3411 Malignant neoplasm of upper lobe, right bronchus or lung: Secondary | ICD-10-CM | POA: Insufficient documentation

## 2017-12-05 DIAGNOSIS — Z01810 Encounter for preprocedural cardiovascular examination: Secondary | ICD-10-CM | POA: Insufficient documentation

## 2017-12-05 HISTORY — DX: Alcoholic cirrhosis of liver without ascites: K70.30

## 2017-12-05 HISTORY — DX: Type 2 diabetes mellitus without complications: E11.9

## 2017-12-05 HISTORY — DX: Emphysema, unspecified: J43.9

## 2017-12-05 HISTORY — DX: Iron deficiency anemia secondary to blood loss (chronic): D50.0

## 2017-12-05 HISTORY — DX: Calculus of kidney: N20.0

## 2017-12-05 HISTORY — DX: Chronic pain syndrome: G89.4

## 2017-12-05 HISTORY — DX: Presence of dental prosthetic device (complete) (partial): Z97.2

## 2017-12-05 HISTORY — DX: Major depressive disorder, single episode, unspecified: F32.9

## 2017-12-05 HISTORY — DX: Diaphragmatic hernia without obstruction or gangrene: K44.9

## 2017-12-05 HISTORY — DX: Other specified disorders of kidney and ureter: N28.89

## 2017-12-05 HISTORY — DX: Spondylosis, unspecified: M47.9

## 2017-12-05 HISTORY — DX: Personal history of other diseases of the digestive system: Z87.19

## 2017-12-05 HISTORY — DX: Malignant neoplasm of unspecified part of unspecified bronchus or lung: C34.90

## 2017-12-05 HISTORY — DX: Spondylosis without myelopathy or radiculopathy, lumbar region: M47.816

## 2017-12-05 HISTORY — DX: Polyneuropathy, unspecified: G62.9

## 2017-12-05 HISTORY — DX: Complete loss of teeth, unspecified cause, unspecified class: K08.109

## 2017-12-05 LAB — CBC
HCT: 38.2 % (ref 36.0–46.0)
Hemoglobin: 12.8 g/dL (ref 12.0–15.0)
MCH: 36.5 pg — ABNORMAL HIGH (ref 26.0–34.0)
MCHC: 33.5 g/dL (ref 30.0–36.0)
MCV: 108.8 fL — ABNORMAL HIGH (ref 78.0–100.0)
Platelets: 141 10*3/uL — ABNORMAL LOW (ref 150–400)
RBC: 3.51 MIL/uL — ABNORMAL LOW (ref 3.87–5.11)
RDW: 14.8 % (ref 11.5–15.5)
WBC: 7.5 10*3/uL (ref 4.0–10.5)

## 2017-12-05 LAB — COMPREHENSIVE METABOLIC PANEL
ALT: 33 U/L (ref 0–44)
AST: 63 U/L — ABNORMAL HIGH (ref 15–41)
Albumin: 3.8 g/dL (ref 3.5–5.0)
Alkaline Phosphatase: 60 U/L (ref 38–126)
Anion gap: 15 (ref 5–15)
BUN: 7 mg/dL (ref 6–20)
CO2: 26 mmol/L (ref 22–32)
Calcium: 9 mg/dL (ref 8.9–10.3)
Chloride: 94 mmol/L — ABNORMAL LOW (ref 98–111)
Creatinine, Ser: 0.81 mg/dL (ref 0.44–1.00)
GFR calc Af Amer: >60 mL/min (ref >60)
GFR calc non Af Amer: >60 mL/min (ref >60)
Glucose, Bld: 117 mg/dL — ABNORMAL HIGH (ref 70–99)
Potassium: 4.2 mmol/L (ref 3.5–5.1)
Sodium: 135 mmol/L (ref 135–145)
Total Bilirubin: 1.1 mg/dL (ref 0.3–1.2)
Total Protein: 7.2 g/dL (ref 6.5–8.1)

## 2017-12-05 LAB — PROTIME-INR
INR: 1.08
Prothrombin Time: 13.9 seconds (ref 11.4–15.2)

## 2017-12-05 LAB — GLUCOSE, CAPILLARY: Glucose-Capillary: 125 mg/dL — ABNORMAL HIGH (ref 70–99)

## 2017-12-06 LAB — HEMOGLOBIN A1C
Hgb A1c MFr Bld: 5.2 % (ref 4.8–5.6)
Mean Plasma Glucose: 103 mg/dL

## 2017-12-06 LAB — TOXASSURE SELECT 13 (MW), URINE

## 2017-12-08 NOTE — Progress Notes (Signed)
Final EKG dated 12-05-2017 in epic.

## 2017-12-12 ENCOUNTER — Other Ambulatory Visit: Payer: Self-pay | Admitting: Radiology

## 2017-12-13 NOTE — Anesthesia Preprocedure Evaluation (Addendum)
Anesthesia Evaluation  Patient identified by MRN, date of birth, ID band Patient awake    Reviewed: Allergy & Precautions, NPO status , [name] Chart, lab work & pertinent test results  History of Anesthesia Complications Negative for: history of anesthetic complications  Airway Mallampati: III  TM Distance: >3 FB Neck ROM: Full    Dental  (+) Lower Dentures, Upper Dentures   Pulmonary COPD, Current Smoker,  Lung cancer RUL    Pulmonary exam normal        Cardiovascular hypertension, Normal cardiovascular exam     Neuro/Psych PSYCHIATRIC DISORDERS Anxiety Depression negative neurological ROS     GI/Hepatic hiatal hernia, GERD  Controlled,(+) Cirrhosis   ascites    , Hepatitis -  Endo/Other  diabetes  Renal/GU negative Renal ROS  negative genitourinary   Musculoskeletal  (+) Arthritis , Fibromyalgia -  Abdominal   Peds  Hematology negative hematology ROS (+)   Anesthesia Other Findings   Reproductive/Obstetrics negative OB ROS                           Anesthesia Physical Anesthesia Plan  ASA: III  Anesthesia Plan: General   Post-op Pain Management:    Induction: Intravenous, Rapid sequence and Cricoid pressure planned  PONV Risk Score and Plan: 2 and Ondansetron, Dexamethasone and Treatment may vary due to age or medical condition  Airway Management Planned: Oral ETT  Additional Equipment:   Intra-op Plan:   Post-operative Plan: Extubation in OR  Informed Consent: I have reviewed the patients History and Physical, chart, labs and discussed the procedure including the risks, benefits and alternatives for the proposed anesthesia with the patient or authorized representative who has indicated his/her understanding and acceptance.     Plan Discussed with:   Anesthesia Plan Comments:         Anesthesia Quick Evaluation

## 2017-12-14 ENCOUNTER — Ambulatory Visit (HOSPITAL_COMMUNITY): Payer: Medicare Other | Admitting: Anesthesiology

## 2017-12-14 ENCOUNTER — Observation Stay (HOSPITAL_COMMUNITY)
Admission: RE | Admit: 2017-12-14 | Discharge: 2017-12-15 | Disposition: A | Discharge status: Home / Self Care | Payer: Medicare Other | Source: Ambulatory Visit | Attending: Diagnostic Radiology | Admitting: Diagnostic Radiology

## 2017-12-14 ENCOUNTER — Other Ambulatory Visit: Payer: Self-pay

## 2017-12-14 ENCOUNTER — Ambulatory Visit (HOSPITAL_COMMUNITY)
Admission: RE | Admit: 2017-12-14 | Discharge: 2017-12-14 | Disposition: A | Discharge status: Home / Self Care | Payer: Medicare Other | Source: Ambulatory Visit | Attending: Diagnostic Radiology | Admitting: Diagnostic Radiology

## 2017-12-14 ENCOUNTER — Encounter (HOSPITAL_COMMUNITY)
Admission: RE | Disposition: A | Discharge status: Home / Self Care | Payer: Self-pay | Source: Ambulatory Visit | Attending: Diagnostic Radiology

## 2017-12-14 ENCOUNTER — Encounter (HOSPITAL_COMMUNITY): Payer: Self-pay | Admitting: General Practice

## 2017-12-14 ENCOUNTER — Ambulatory Visit (HOSPITAL_COMMUNITY): Payer: Medicare Other

## 2017-12-14 DIAGNOSIS — C641 Malignant neoplasm of right kidney, except renal pelvis: Secondary | ICD-10-CM | POA: Insufficient documentation

## 2017-12-14 DIAGNOSIS — Z01818 Encounter for other preprocedural examination: Secondary | ICD-10-CM

## 2017-12-14 DIAGNOSIS — N289 Disorder of kidney and ureter, unspecified: Secondary | ICD-10-CM | POA: Diagnosis present

## 2017-12-14 DIAGNOSIS — N2889 Other specified disorders of kidney and ureter: Secondary | ICD-10-CM

## 2017-12-14 HISTORY — PX: RADIOLOGY WITH ANESTHESIA: SHX6223

## 2017-12-14 LAB — CBC WITH DIFFERENTIAL/PLATELET
Basophils Absolute: 0 10*3/uL (ref 0.0–0.1)
Basophils Relative: 0 %
Eosinophils Absolute: 0.1 10*3/uL (ref 0.0–0.7)
Eosinophils Relative: 2 %
HCT: 37 % (ref 36.0–46.0)
Hemoglobin: 12.4 g/dL (ref 12.0–15.0)
Lymphocytes Relative: 18 %
Lymphs Abs: 1.4 10*3/uL (ref 0.7–4.0)
MCH: 35.9 pg — ABNORMAL HIGH (ref 26.0–34.0)
MCHC: 33.5 g/dL (ref 30.0–36.0)
MCV: 107.2 fL — ABNORMAL HIGH (ref 78.0–100.0)
Monocytes Absolute: 1 10*3/uL (ref 0.1–1.0)
Monocytes Relative: 12 %
Neutro Abs: 5.5 10*3/uL (ref 1.7–7.7)
Neutrophils Relative %: 68 %
Platelets: 157 10*3/uL (ref 150–400)
RBC: 3.45 MIL/uL — ABNORMAL LOW (ref 3.87–5.11)
RDW: 14.3 % (ref 11.5–15.5)
WBC: 8.1 10*3/uL (ref 4.0–10.5)

## 2017-12-14 LAB — TYPE AND SCREEN
ABO/RH(D): O POS
Antibody Screen: NEGATIVE

## 2017-12-14 LAB — ABO/RH: ABO/RH(D): O POS

## 2017-12-14 LAB — GLUCOSE, CAPILLARY
Glucose-Capillary: 144 mg/dL — ABNORMAL HIGH (ref 70–99)
Glucose-Capillary: 178 mg/dL — ABNORMAL HIGH (ref 70–99)
Glucose-Capillary: 180 mg/dL — ABNORMAL HIGH (ref 70–99)
Glucose-Capillary: 198 mg/dL — ABNORMAL HIGH (ref 70–99)

## 2017-12-14 LAB — COMPREHENSIVE METABOLIC PANEL
ALT: 24 U/L (ref 0–44)
AST: 45 U/L — ABNORMAL HIGH (ref 15–41)
Albumin: 3.4 g/dL — ABNORMAL LOW (ref 3.5–5.0)
Alkaline Phosphatase: 85 U/L (ref 38–126)
Anion gap: 14 (ref 5–15)
BUN: 6 mg/dL (ref 6–20)
CO2: 23 mmol/L (ref 22–32)
Calcium: 9.3 mg/dL (ref 8.9–10.3)
Chloride: 98 mmol/L (ref 98–111)
Creatinine, Ser: 0.68 mg/dL (ref 0.44–1.00)
GFR calc Af Amer: >60 mL/min (ref >60)
GFR calc non Af Amer: >60 mL/min (ref >60)
Glucose, Bld: 150 mg/dL — ABNORMAL HIGH (ref 70–99)
Potassium: 4.2 mmol/L (ref 3.5–5.1)
Sodium: 135 mmol/L (ref 135–145)
Total Bilirubin: 0.8 mg/dL (ref 0.3–1.2)
Total Protein: 6.9 g/dL (ref 6.5–8.1)

## 2017-12-14 LAB — PROTIME-INR
INR: 1.15
Prothrombin Time: 14.6 seconds (ref 11.4–15.2)

## 2017-12-14 SURGERY — CT WITH ANESTHESIA
Anesthesia: General | Laterality: Right

## 2017-12-14 MED ORDER — PROPOFOL 10 MG/ML IV BOLUS
INTRAVENOUS | Status: DC | PRN
  Administered 2017-12-14: 150 mg via INTRAVENOUS

## 2017-12-14 MED ORDER — SPIRONOLACTONE 100 MG PO TABS
100.0000 mg | ORAL_TABLET | Freq: Every day | ORAL | Status: DC
  Administered 2017-12-14: 100 mg via ORAL
  Filled 2017-12-14 (×2): qty 1

## 2017-12-14 MED ORDER — SODIUM CHLORIDE 0.9 % IJ SOLN
INTRAMUSCULAR | Status: AC
  Filled 2017-12-14: qty 100

## 2017-12-14 MED ORDER — OXYCODONE HCL 5 MG PO TABS
ORAL_TABLET | ORAL | Status: AC
  Administered 2017-12-14: 20 mg via ORAL
  Filled 2017-12-14: qty 4

## 2017-12-14 MED ORDER — DEXAMETHASONE SODIUM PHOSPHATE 10 MG/ML IJ SOLN
INTRAMUSCULAR | Status: DC | PRN
  Administered 2017-12-14: 10 mg via INTRAVENOUS

## 2017-12-14 MED ORDER — LINAGLIPTIN 5 MG PO TABS
5.0000 mg | ORAL_TABLET | Freq: Every day | ORAL | Status: DC
  Administered 2017-12-14: 5 mg via ORAL
  Filled 2017-12-14 (×2): qty 1

## 2017-12-14 MED ORDER — IOPAMIDOL (ISOVUE-370) INJECTION 76%
75.0000 mL | Freq: Once | INTRAVENOUS | Status: AC | PRN
  Administered 2017-12-14: 75 mL via INTRAVENOUS

## 2017-12-14 MED ORDER — ROCURONIUM BROMIDE 10 MG/ML (PF) SYRINGE
PREFILLED_SYRINGE | INTRAVENOUS | Status: DC | PRN
  Administered 2017-12-14: 40 mg via INTRAVENOUS
  Administered 2017-12-14 (×3): 10 mg via INTRAVENOUS

## 2017-12-14 MED ORDER — IOPAMIDOL (ISOVUE-370) INJECTION 76%
INTRAVENOUS | Status: AC
  Filled 2017-12-14: qty 100

## 2017-12-14 MED ORDER — LACTATED RINGERS IV SOLN
INTRAVENOUS | Status: DC
  Administered 2017-12-14: 08:00:00 via INTRAVENOUS

## 2017-12-14 MED ORDER — OXYCODONE HCL 5 MG PO TABS
20.0000 mg | ORAL_TABLET | Freq: Once | ORAL | Status: AC | PRN
  Administered 2017-12-14: 20 mg via ORAL

## 2017-12-14 MED ORDER — MIDAZOLAM HCL 2 MG/2ML IJ SOLN
INTRAMUSCULAR | Status: AC
  Filled 2017-12-14: qty 2

## 2017-12-14 MED ORDER — ONDANSETRON HCL 4 MG/2ML IJ SOLN
INTRAMUSCULAR | Status: DC | PRN
  Administered 2017-12-14: 4 mg via INTRAVENOUS

## 2017-12-14 MED ORDER — OXYCODONE HCL 5 MG PO TABS: 5.0000 mg | ORAL_TABLET | Freq: Once | ORAL | Status: DC | PRN

## 2017-12-14 MED ORDER — FENTANYL CITRATE (PF) 250 MCG/5ML IJ SOLN
INTRAMUSCULAR | Status: AC
  Filled 2017-12-14: qty 5

## 2017-12-14 MED ORDER — PANTOPRAZOLE SODIUM 40 MG PO TBEC
40.0000 mg | DELAYED_RELEASE_TABLET | Freq: Two times a day (BID) | ORAL | Status: DC
  Administered 2017-12-14: 40 mg via ORAL
  Filled 2017-12-14 (×3): qty 1

## 2017-12-14 MED ORDER — IOPAMIDOL (ISOVUE-300) INJECTION 61%
INTRAVENOUS | Status: AC
  Filled 2017-12-14: qty 30

## 2017-12-14 MED ORDER — OXYCODONE HCL 5 MG/5ML PO SOLN
5.0000 mg | Freq: Once | ORAL | Status: DC | PRN
  Filled 2017-12-14: qty 5

## 2017-12-14 MED ORDER — ONDANSETRON HCL 4 MG/2ML IJ SOLN: 4.0000 mg | Freq: Once | INTRAMUSCULAR | Status: DC | PRN

## 2017-12-14 MED ORDER — CEFAZOLIN SODIUM-DEXTROSE 2-4 GM/100ML-% IV SOLN
2.0000 g | INTRAVENOUS | Status: AC
  Administered 2017-12-14: 2 g via INTRAVENOUS
  Filled 2017-12-14: qty 100

## 2017-12-14 MED ORDER — ONDANSETRON HCL 4 MG/2ML IJ SOLN: 4.0000 mg | Freq: Four times a day (QID) | INTRAMUSCULAR | Status: DC | PRN

## 2017-12-14 MED ORDER — SUCCINYLCHOLINE CHLORIDE 200 MG/10ML IV SOSY
PREFILLED_SYRINGE | INTRAVENOUS | Status: DC | PRN
  Administered 2017-12-14: 100 mg via INTRAVENOUS

## 2017-12-14 MED ORDER — MIDAZOLAM HCL 5 MG/5ML IJ SOLN
INTRAMUSCULAR | Status: DC | PRN
  Administered 2017-12-14: 2 mg via INTRAVENOUS

## 2017-12-14 MED ORDER — FUROSEMIDE 40 MG PO TABS
40.0000 mg | ORAL_TABLET | Freq: Two times a day (BID) | ORAL | Status: DC
  Administered 2017-12-14 – 2017-12-15 (×2): 40 mg via ORAL
  Filled 2017-12-14 (×2): qty 1

## 2017-12-14 MED ORDER — SODIUM CHLORIDE 0.9 % IV SOLN
INTRAVENOUS | Status: AC
  Filled 2017-12-14: qty 250

## 2017-12-14 MED ORDER — LIDOCAINE 2% (20 MG/ML) 5 ML SYRINGE
INTRAMUSCULAR | Status: DC | PRN
  Administered 2017-12-14: 60 mg via INTRAVENOUS

## 2017-12-14 MED ORDER — FENTANYL CITRATE (PF) 100 MCG/2ML IJ SOLN
INTRAMUSCULAR | Status: DC | PRN
  Administered 2017-12-14: 25 ug via INTRAVENOUS
  Administered 2017-12-14 (×2): 50 ug via INTRAVENOUS
  Administered 2017-12-14: 25 ug via INTRAVENOUS

## 2017-12-14 MED ORDER — FENTANYL CITRATE (PF) 100 MCG/2ML IJ SOLN
INTRAMUSCULAR | Status: AC
  Administered 2017-12-14: 50 ug via INTRAVENOUS
  Filled 2017-12-14: qty 2

## 2017-12-14 MED ORDER — FENTANYL CITRATE (PF) 100 MCG/2ML IJ SOLN
25.0000 ug | INTRAMUSCULAR | Status: DC | PRN
  Administered 2017-12-14 (×3): 50 ug via INTRAVENOUS

## 2017-12-14 MED ORDER — ESCITALOPRAM OXALATE 10 MG PO TABS
10.0000 mg | ORAL_TABLET | Freq: Every day | ORAL | Status: DC
  Filled 2017-12-14: qty 1

## 2017-12-14 MED ORDER — IOPAMIDOL (ISOVUE-300) INJECTION 61%
5.0000 mL | Freq: Once | INTRAVENOUS | Status: AC | PRN
  Administered 2017-12-14: 5 mL

## 2017-12-14 MED ORDER — HYDROCODONE-ACETAMINOPHEN 5-325 MG PO TABS
1.0000 | ORAL_TABLET | ORAL | Status: DC | PRN
  Administered 2017-12-14 (×2): 2 via ORAL
  Administered 2017-12-15: 1 via ORAL
  Administered 2017-12-15: 2 via ORAL
  Filled 2017-12-14 (×4): qty 2

## 2017-12-14 NOTE — Sedation Documentation (Signed)
Anesthesia in to sedate and monitor patient.

## 2017-12-14 NOTE — H&P (Signed)
Referring Physician(s): Brandon,A/Rao,A  Supervising Physician: Markus Daft  Patient Status:  [facility] OP TBA  Chief Complaint:  Right renal lesion  Subjective: Patient familiar to IR service from prior paracenteses in 2017 in 2018 as well as consultations with Dr. Anselm Pancoast on 04/13/2017 and 11/15/2017 to discuss treatment options for suspicious and enlarging right upper pole renal lesion approximately 3 cm diameter.  She was deemed an appropriate candidate for CT-guided thermal ablation/ possible biopsy and presents today for the procedure.  Past medical history also significant for stage I right lung carcinoma, cirrhosis and hepatitis C.  She currently denies fever, headache, chest pain, dyspnea, cough, abdominal pain, nausea, vomiting or bleeding.  She does have chronic back pain and continues to smoke.  Past Medical History:  Diagnosis Date  . Alcohol abuse   . Alcoholic cirrhosis ([place])    GI-- dr skulskie/  hx ascites 2017   . Anxiety   . Chronic hepatitis C ([place])    treated w/ harvoni  . Chronic LBP 12/25/2014   Overview:  Post extensive surgery   . Chronic pain syndrome    neck , upper back, post extensive spinal surgery  . DDD (degenerative disc disease), lumbar   . Diverticulosis   . Emphysema lung ([place])   . Fibromyalgia   . Full dentures   . GERD (gastroesophageal reflux disease)    gastroparesis  . Hiatal hernia   . History of GI bleed    06/ 2017  ulcers and gastric erosions, transfused x4/  10/ 2017 unknown source/  01/ 2018  transfused x2 units and IV Iron   . Hypertension   . Iron deficiency anemia due to chronic blood loss    hemotologist-- dr Alvy Bimler  . Lumbar facet joint syndrome   . Lumbar spondylosis   . Lung cancer ([place]) 11/25/2016   right upper lobe nodule , Stage I,  treatment SBRT  . MDD (major depressive disorder)   . Nephrolithiasis    per CT 09-30-2017 right side nonobstructive  . OA (osteoarthritis of spine)   . Polyarthritis   . Polyneuropathy     . Restless leg syndrome   . Right renal mass   . RLS (restless legs syndrome)   . SOB (shortness of breath)   . Type 2 diabetes mellitus ([place])    followed by pcp  . Urinary frequency   . Vitamin D deficiency disease    Past Surgical History:  Procedure Laterality Date  . COLONOSCOPY WITH PROPOFOL N/A 09/25/2015   Procedure: COLONOSCOPY WITH PROPOFOL;  Surgeon: Lollie Sails, MD;  Location: [facility] ENDOSCOPY;  Service: Endoscopy;  Laterality: N/A;  . COLONOSCOPY WITH PROPOFOL N/A 04/01/2016   Procedure: COLONOSCOPY WITH PROPOFOL;  Surgeon: Lollie Sails, MD;  Location: [facility] ENDOSCOPY;  Service: Endoscopy;  Laterality: N/A;  . ELECTROMAGNETIC NAVIGATION BROCHOSCOPY N/A 11/25/2016   Procedure: ELECTROMAGNETIC NAVIGATION BRONCHOSCOPY;  Surgeon: Flora Lipps, MD;  Location: [facility] ORS;  Service: Cardiopulmonary;  Laterality: N/A;  . ENDOMETRIAL ABLATION  2007  . ESOPHAGOGASTRODUODENOSCOPY N/A 04/14/2015   Procedure: ESOPHAGOGASTRODUODENOSCOPY (EGD);  Surgeon: Lollie Sails, MD;  Location: [facility] ENDOSCOPY;  Service: Endoscopy;  Laterality: N/A;  . ESOPHAGOGASTRODUODENOSCOPY (EGD) WITH PROPOFOL N/A 09/16/2015   Procedure: ESOPHAGOGASTRODUODENOSCOPY (EGD) WITH PROPOFOL;  Surgeon: Lollie Sails, MD;  Location: [facility] ENDOSCOPY;  Service: Endoscopy;  Laterality: N/A;  . ESOPHAGOGASTRODUODENOSCOPY (EGD) WITH PROPOFOL N/A 01/22/2016   Procedure: ESOPHAGOGASTRODUODENOSCOPY (EGD) WITH PROPOFOL;  Surgeon: [name], MD;  Location: [place];  Service:  Endoscopy;  Laterality: N/A;  . ESOPHAGOGASTRODUODENOSCOPY (EGD) WITH PROPOFOL N/A 04/01/2016   Procedure: ESOPHAGOGASTRODUODENOSCOPY (EGD) WITH PROPOFOL;  Surgeon: Lollie Sails, MD;  Location: [facility] ENDOSCOPY;  Service: Endoscopy;  Laterality: N/A;  . IR RADIOLOGIST EVAL & MGMT  04/13/2017  . IR RADIOLOGIST EVAL & MGMT  11/15/2017  . SPINAL FUSION  12/19/2011   T5 to sacrum (rod, screws)  . TONSILLECTOMY  age 14  . TUBAL LIGATION  Bilateral yrs ago  . UMBILICAL HERNIA REPAIR N/A 11/06/2015   Procedure: HERNIA REPAIR UMBILICAL ADULT;  Surgeon: [name], MD;  Location: [facility] ORS;  Service: General;  Laterality: N/A;      Allergies: Patient has no known allergies.  Medications: Prior to Admission medications   Medication Sig Start Date End Date Taking? Authorizing Provider  cyclobenzaprine (FLEXERIL) 5 MG tablet Take 1 tablet (5 mg total) by mouth 3 (three) times daily as needed for muscle spasms. 12/01/17  Yes Vevelyn Francois, NP  escitalopram (LEXAPRO) 10 MG tablet Take 10 mg by mouth daily.  11/09/17  Yes [provider]  furosemide (LASIX) 40 MG tablet Take 40 mg by mouth 2 (two) times daily.  10/13/15  Yes [provider]  linagliptin (TRADJENTA) 5 MG TABS tablet Take 5 mg by mouth daily.   Yes [provider]  metFORMIN (GLUCOPHAGE) 500 MG tablet Take 500 mg by mouth 2 (two) times daily.    Yes [provider]  Multiple Vitamin (MULTIVITAMIN WITH MINERALS) TABS tablet Take 1 tablet by mouth daily.   Yes [provider]  Oxycodone HCl 20 MG TABS Take 1 tablet (20 mg total) by mouth every 6 (six) hours as needed. Patient taking differently: Take 1 tablet by mouth every 6 (six) hours as needed (for chronic pain.).  12/17/17 01/16/18 Yes King, Diona Foley, NP  OZEMPIC 1 MG/DOSE SOPN Inject 1 mg into the skin every Tuesday.   Yes [provider]  pantoprazole (PROTONIX) 40 MG tablet Take 40 mg by mouth 2 (two) times daily.  07/29/16  Yes [provider]  pregabalin (LYRICA) 200 MG capsule Take 200 mg by mouth 2 (two) times daily.   Yes [provider]  spironolactone (ALDACTONE) 50 MG tablet Take 100 mg by mouth daily.  09/08/16  Yes [provider]  lactulose (CHRONULAC) 10 GM/15ML solution Take 10 g by mouth 3 (three) times daily as needed for mild constipation or moderate constipation.     [provider]  Oxycodone HCl 20 MG TABS  Take 1 tablet (20 mg total) by mouth every 6 (six) hours. Patient not taking: Reported on 12/02/2017 02/15/18 03/17/18  Vevelyn Francois, NP     Vital Signs: BP 112/69   Pulse 92   Temp 98.2 F (36.8 C) (Oral)   Resp (!) 98   Ht 5\' 10"  (1.778 m)   Wt 163 lb 2 oz (74 kg)   SpO2 100%   BMI 23.41 kg/m   Physical Exam awake, alert.  Chest with distant but clear breath sounds bilaterally.  Heart with regular rate and rhythm.  Abdomen soft, slightly distended, positive bowel sounds, nontender.  Extremities with full range of motion.  Imaging: No results found.  Labs:  CBC: Recent Labs    08/24/17 1015 09/28/17 1133 12/05/17 1128 12/14/17 0732  WBC 8.1 10.1 7.5 8.1  HGB 12.7 12.9 12.8 12.4  HCT 36.3 36.5 38.2 37.0  PLT 151 174 141* 157    COAGS: Recent Labs  08/24/17 1015 12/05/17 1128 12/14/17 0732  INR 1.16 1.08 1.15  APTT 35  --   --     BMP: Recent Labs    04/23/17 1140 09/30/17 1126 12/05/17 1128 12/14/17 0732  NA 130*  --  135 135  K 4.0  --  4.2 4.2  CL 93*  --  94* 98  CO2 25  --  26 23  GLUCOSE 160*  --  117* 150*  BUN 11  --  7 6  CALCIUM 8.8*  --  9.0 9.3  CREATININE 0.72 0.60 0.81 0.68  GFRNONAA >60  --  >60 >60  GFRAA >60  --  >60 >60    LIVER FUNCTION TESTS: Recent Labs    04/23/17 1140 12/05/17 1128 12/14/17 0732  BILITOT 0.6 1.1 0.8  AST 68* 63* 45*  ALT 35 33 24  ALKPHOS 90 60 85  PROT 7.5 7.2 6.9  ALBUMIN 4.2 3.8 3.4*    Assessment and Plan: Patient with history of cirrhosis, hepatitis C, stage I right lung carcinoma and suspicious and enlarging right upper pole renal lesion.  Seen in consultation previously by Dr.Henn and deemed an appropriate candidate for CT-guided thermal ablation and possible biopsy of the lesion.  She presents today for the procedure.  Details/risks of procedure, including but not limited to, internal bleeding, infection, injury to adjacent structures, anesthesia related complications discussed with  patient and spouse with their understanding and consent.  Post procedure she will be admitted for overnight observation.   Electronically Signed: D. Rowe Robert, PA-C 12/14/2017, 8:17 AM   I spent a total of 30 minutes at the the patient's bedside AND on the patient's hospital floor or unit, greater than 50% of which was counseling/coordinating care for CT-guided thermal ablation/possible biopsy of right renal lesion

## 2017-12-14 NOTE — Anesthesia Procedure Notes (Signed)
Procedure Name: Intubation Date/Time: 12/14/2017 9:00 AM Performed by: Victoriano Lain, CRNA Pre-anesthesia Checklist: Patient identified, Emergency Drugs available, Suction available, Patient being monitored and Timeout performed Patient Re-evaluated:Patient Re-evaluated prior to induction Oxygen Delivery Method: Circle system utilized Preoxygenation: Pre-oxygenation with 100% oxygen Induction Type: IV induction, Rapid sequence and Cricoid Pressure applied Laryngoscope Size: Mac and 4 Grade View: Grade I Tube type: Oral Tube size: 7.5 mm Number of attempts: 1 Airway Equipment and Method: Stylet Placement Confirmation: ETT inserted through vocal cords under direct vision,  positive ETCO2 and breath sounds checked- equal and bilateral Secured at: 22 cm Tube secured with: Tape Dental Injury: Teeth and Oropharynx as per pre-operative assessment  Comments: RSI with cricoid pressure by Dr. Kerin Perna due to history of GERD and ascites. DL X 1 with MAC 4. Grade 1 view. ATOI. BBS=.  ETT secured at 22 cm at the lip.

## 2017-12-14 NOTE — Procedures (Signed)
  Pre-operative Diagnosis: Suspicious right renal lesion       Post-operative Diagnosis: Suspicious right renal lesion   Indications: Right renal lesion is enlarging and needs treatment and biopsy  Procedure: CT and US guided microwave ablation of right renal upper pole lesion.  Biopsy of the renal lesion.  Hydro dissection around the right renal lesion   Findings: Placement of two PR Neuwave probes in lesion.   5 minute ablation.  2 cores obtained.    Complications: None     EBL: Minimal  Plan: PACU for recovery and then overnight observation.

## 2017-12-14 NOTE — Transfer of Care (Signed)
Immediate Anesthesia Transfer of Care Note  Patient: Audrey Garrett  Procedure(s) Performed: CT MICROWAVE THERMAL ABLATION AND BIOPSY WITH ANESTHESIA (Right )  Patient Location: PACU  Anesthesia Type:General  Level of Consciousness: awake, alert , oriented and patient cooperative  Airway & Oxygen Therapy: Patient Spontanous Breathing and Patient connected to face mask oxygen  Post-op Assessment: Report given to RN, Post -op Vital signs reviewed and stable and Patient moving all extremities  Post vital signs: Reviewed and stable  Last Vitals:  Vitals Value Taken Time  BP 122/74 12/14/2017 12:09 PM  Temp    Pulse 97 12/14/2017 12:12 PM  Resp 9 12/14/2017 12:12 PM  SpO2 100 % 12/14/2017 12:12 PM  Vitals shown include unvalidated device data.  Last Pain:  Vitals:   12/14/17 0732  TempSrc: Oral  PainSc: 7          Complications: No apparent anesthesia complications

## 2017-12-14 NOTE — Progress Notes (Signed)
Patient ID: Audrey Garrett, female   DOB: 01-14-1961, 57 y.o.   MRN: [id number] Patient doing okay.  Only complaint is some mild lower back pain which is chronic for her.  Denies worsening right flank pain, nausea, vomiting or respiratory difficulties. She is hungry. VSS;AF Puncture sites right flank clean, dry, no significant bleeding at site, not significantly tender.  No abdominal tenderness.  Foley catheter in place draining yellow urine.  Assessment/plan: History of enlarging right renal lesion, status post CT-guided microwave ablation and biopsy earlier today.  For overnight observation.  Check morning labs.  DC Foley catheter later today.  Follow-up with Dr.Henn  in [place] clinic in 3 to 4 weeks.  Follow-up imaging in 3 months.  Check final pathology.  Rowe Robert, [facility]

## 2017-12-15 DIAGNOSIS — C641 Malignant neoplasm of right kidney, except renal pelvis: Secondary | ICD-10-CM | POA: Diagnosis not present

## 2017-12-15 LAB — GLUCOSE, CAPILLARY: Glucose-Capillary: 99 mg/dL (ref 70–99)

## 2017-12-15 LAB — CBC WITH DIFFERENTIAL/PLATELET
Basophils Absolute: 0 10*3/uL (ref 0.0–0.1)
Basophils Relative: 0 %
Eosinophils Absolute: 0 10*3/uL (ref 0.0–0.7)
Eosinophils Relative: 1 %
HCT: 31.2 % — ABNORMAL LOW (ref 36.0–46.0)
Hemoglobin: 10.4 g/dL — ABNORMAL LOW (ref 12.0–15.0)
Lymphocytes Relative: 23 %
Lymphs Abs: 1.3 10*3/uL (ref 0.7–4.0)
MCH: 36.1 pg — ABNORMAL HIGH (ref 26.0–34.0)
MCHC: 33.3 g/dL (ref 30.0–36.0)
MCV: 108.3 fL — ABNORMAL HIGH (ref 78.0–100.0)
Monocytes Absolute: 0.7 10*3/uL (ref 0.1–1.0)
Monocytes Relative: 13 %
Neutro Abs: 3.7 10*3/uL (ref 1.7–7.7)
Neutrophils Relative %: 63 %
Platelets: 123 10*3/uL — ABNORMAL LOW (ref 150–400)
RBC: 2.88 MIL/uL — ABNORMAL LOW (ref 3.87–5.11)
RDW: 14.4 % (ref 11.5–15.5)
WBC: 5.8 10*3/uL (ref 4.0–10.5)

## 2017-12-15 LAB — BASIC METABOLIC PANEL
Anion gap: 8 (ref 5–15)
BUN: 7 mg/dL (ref 6–20)
CO2: 27 mmol/L (ref 22–32)
Calcium: 8.4 mg/dL — ABNORMAL LOW (ref 8.9–10.3)
Chloride: 103 mmol/L (ref 98–111)
Creatinine, Ser: 0.66 mg/dL (ref 0.44–1.00)
GFR calc Af Amer: >60 mL/min (ref >60)
GFR calc non Af Amer: >60 mL/min (ref >60)
Glucose, Bld: 120 mg/dL — ABNORMAL HIGH (ref 70–99)
Potassium: 3.7 mmol/L (ref 3.5–5.1)
Sodium: 138 mmol/L (ref 135–145)

## 2017-12-15 NOTE — Anesthesia Postprocedure Evaluation (Signed)
Anesthesia Post Note  Patient: AMORI COOPERMAN  Procedure(s) Performed: CT MICROWAVE THERMAL ABLATION AND BIOPSY WITH ANESTHESIA (Right )     Patient location during evaluation: PACU Anesthesia Type: General Level of consciousness: awake and alert Pain management: pain level controlled Vital Signs Assessment: post-procedure vital signs reviewed and stable Respiratory status: spontaneous breathing, nonlabored ventilation, respiratory function stable and patient connected to nasal cannula oxygen Cardiovascular status: blood pressure returned to baseline and stable Postop Assessment: no apparent nausea or vomiting Anesthetic complications: no    Last Vitals:  Vitals:   12/15/17 0817 12/15/17 0904  BP: (!) 92/56   Pulse: 89   Resp: 20   Temp: 37.2 C   SpO2: 99% 96%    Last Pain:  Vitals:   12/15/17 0908  TempSrc:   PainSc: 6                  Lidia Collum

## 2017-12-15 NOTE — Discharge Summary (Signed)
Patient ID: CORDIE BEAZLEY MRN: [id number] DOB/AGE: February 18, 1961 57 y.o.  Admit date: 12/14/2017 Discharge date: 12/15/2017  Supervising Physician: Corrie Mckusick  Patient Status: [facility] - In-pt  Admission Diagnoses: Renal lesion  Discharge Diagnoses:  Active Problems:   Renal lesion   Discharged Condition: stable  Hospital Course:  Patient presented to [facility] 12/15/2017 for an image-guided microwave ablation and biopsy of right renal lesion with Dr. Anselm Pancoast. Procedure occurred without major complications and patient was transferred to floor for overnight observation. No major events occurred overnight.  Patient awake and alert laying in bed with no complaints at this time. Plan to discharge home today and follow-up with Dr. Anselm Pancoast in clinic in 2-4 weeks.   Consults: None  Significant Diagnostic Studies: Dg Chest 1 View  Result Date: 12/14/2017 CLINICAL DATA:  Preop for surgery. EXAM: CHEST  1 VIEW COMPARISON:  Chest CT 04/04/2017 FINDINGS: The cardiac silhouette, mediastinal and hilar contours are normal. The lungs demonstrate stable mild emphysematous changes with scarring changes in the right midlung. Vague nodular density in the right upper lobe is noted and better demonstrated on the prior CT scan. No infiltrates or effusions. The bony structures are intact. Harrington rods are noted in the spine. IMPRESSION: Mild emphysematous changes and areas of pulmonary scarring. Persistent nodularity in the right mid lung. No acute pulmonary findings. Electronically Signed   By: Marijo Sanes M.D.   On: 12/14/2017 10:43   Ct Guide Tissue Ablation  Result Date: 12/14/2017 INDICATION: 57 year old with an enlarging suspicious right renal lesion. Imaging findings are concerning for a renal cell carcinoma. Patient is not a good surgical candidate for resection. Plan for image guided microwave ablation of the lesion and biopsy. EXAM: CT-GUIDED MICROWAVE ABLATION OF RIGHT RENAL LESION IMAGE GUIDED BIOPSY OF  THE RIGHT RENAL LESION WITH CT AND ULTRASOUND GUIDANCE COMPARISON:  CT of the abdomen 09/30/2017 MEDICATIONS: Ancef 2 g; The antibiotic was administered in an appropriate time interval prior to needle puncture of the skin. ANESTHESIA/SEDATION: General - as administered by the Anesthesia department FLUOROSCOPY TIME:  None CONTRAST:  75 mL intravenous Isovue 370. 5 mL of Isovue-300 was used for hydro dissection. COMPLICATIONS: None immediate. TECHNIQUE: The procedure was explained to the patient. The risks and benefits of the procedure were discussed and the patient's questions were addressed. Informed consent was obtained from the patient. Patient was placed under general anesthesia. Time-out was performed. Patient was placed prone on the CT scanner. Images through the abdomen were obtained. Right kidney was identified with ultrasound. The right flank was prepped and draped in sterile fashion. Maximal barrier sterile technique was utilized including caps, mask, sterile gowns, sterile gloves, sterile drape, hand hygiene and skin antiseptic. Post contrast images through the right kidney were obtained. The right kidney was also evaluated with ultrasound. [place] needles were directed into the right renal lesion using CT and ultrasound guidance. [place] needles removed. 17 gauge NeuWave microwave ablation probes were directed into the right kidney using CT and ultrasound guidance. The first needle was placed along the more caudal aspect of the lesion. Second needle was placed along the cephalad aspect of the lesion. Multiple CT images were used to confirm correct needle placement. The needles were positioned just beyond the lesion. Using ultrasound guidance, 17 gauge coaxial needle was directed into the renal lesion. CT images were used to confirm placement in the lesion. Two core biopsies were obtained with an 18 gauge device. Specimens placed in formalin. Attention was directed  to creating hydro  dissection around the right kidney. Two 22 gauge needles were placed along the medial and lateral aspect the right kidney. Dilute contrast was injected around the kidney using CT guidance. Greater than 200 mL of dilute contrast was placed around the right kidney. After the needles were appropriately positioned and the hydro dissection was placed, the right renal lesion was treated for 5 minutes with the microwave device. Needles removed without complication. Follow up CT images were obtained. Bandage placed over the puncture site. FINDINGS: Enhancing lesion in the right kidney upper pole. Successful core biopsies were obtained of the lesion. Microwave ablation was performed using 2 microwave probes. Expected gas at the ablation site at the end of the procedure. IMPRESSION: Successful CT-guided microwave ablation of the right kidney upper pole lesion. Successful image guided biopsy of the right kidney upper pole lesion. Electronically Signed   By: Markus Daft M.D.   On: 12/14/2017 14:12   Ct Biopsy  Result Date: 12/14/2017 INDICATION: 57 year old with an enlarging suspicious right renal lesion. Imaging findings are concerning for a renal cell carcinoma. Patient is not a good surgical candidate for resection. Plan for image guided microwave ablation of the lesion and biopsy. EXAM: CT-GUIDED MICROWAVE ABLATION OF RIGHT RENAL LESION IMAGE GUIDED BIOPSY OF THE RIGHT RENAL LESION WITH CT AND ULTRASOUND GUIDANCE COMPARISON:  CT of the abdomen 09/30/2017 MEDICATIONS: Ancef 2 g; The antibiotic was administered in an appropriate time interval prior to needle puncture of the skin. ANESTHESIA/SEDATION: General - as administered by the Anesthesia department FLUOROSCOPY TIME:  None CONTRAST:  75 mL intravenous Isovue 370. 5 mL of Isovue-300 was used for hydro dissection. COMPLICATIONS: None immediate. TECHNIQUE: The procedure was explained to the patient. The risks and benefits of the procedure were discussed and the patient's  questions were addressed. Informed consent was obtained from the patient. Patient was placed under general anesthesia. Time-out was performed. Patient was placed prone on the CT scanner. Images through the abdomen were obtained. Right kidney was identified with ultrasound. The right flank was prepped and draped in sterile fashion. Maximal barrier sterile technique was utilized including caps, mask, sterile gowns, sterile gloves, sterile drape, hand hygiene and skin antiseptic. Post contrast images through the right kidney were obtained. The right kidney was also evaluated with ultrasound. [place] needles were directed into the right renal lesion using CT and ultrasound guidance. [place] needles removed. 17 gauge NeuWave microwave ablation probes were directed into the right kidney using CT and ultrasound guidance. The first needle was placed along the more caudal aspect of the lesion. Second needle was placed along the cephalad aspect of the lesion. Multiple CT images were used to confirm correct needle placement. The needles were positioned just beyond the lesion. Using ultrasound guidance, 17 gauge coaxial needle was directed into the renal lesion. CT images were used to confirm placement in the lesion. Two core biopsies were obtained with an 18 gauge device. Specimens placed in formalin. Attention was directed to creating hydro dissection around the right kidney. Two 22 gauge needles were placed along the medial and lateral aspect the right kidney. Dilute contrast was injected around the kidney using CT guidance. Greater than 200 mL of dilute contrast was placed around the right kidney. After the needles were appropriately positioned and the hydro dissection was placed, the right renal lesion was treated for 5 minutes with the microwave device. Needles removed without complication. Follow up CT images were obtained. Bandage placed over the  puncture site. FINDINGS: Enhancing lesion in the right  kidney upper pole. Successful core biopsies were obtained of the lesion. Microwave ablation was performed using 2 microwave probes. Expected gas at the ablation site at the end of the procedure. IMPRESSION: Successful CT-guided microwave ablation of the right kidney upper pole lesion. Successful image guided biopsy of the right kidney upper pole lesion. Electronically Signed   By: Markus Daft M.D.   On: 12/14/2017 14:12   [facility] Eval [facility]  Result Date: 11/15/2017 Please refer to notes tab for details about interventional procedure. (Op Note)   Discharge Exam: Blood pressure (!) 92/56, pulse 89, temperature 98.9 F (37.2 C), temperature source Oral, resp. rate 20, height 5\' 10"  (1.778 m), weight 163 lb 2 oz (74 kg), SpO2 96 %. Physical Exam  Constitutional: She is oriented to person, place, and time. She appears well-developed and well-nourished. No distress.  Pulmonary/Chest: Effort normal. No respiratory distress.  Abdominal: Soft. There is no tenderness.  Neurological: She is alert and oriented to person, place, and time.  Skin: Skin is warm and dry.  Right flank puncture sites without erythema, drainage, or tenderness.  Psychiatric: She has a normal mood and affect. Her behavior is normal. Judgment and thought content normal.  Nursing note and vitals reviewed.   Disposition: Discharge disposition: 01-Home or Self Care       Discharge Instructions    Call MD for:  difficulty breathing, headache or visual disturbances   Complete by:  As directed    Call MD for:  extreme fatigue   Complete by:  As directed    Call MD for:  hives   Complete by:  As directed    Call MD for:  persistant dizziness or light-headedness   Complete by:  As directed    Call MD for:  persistant nausea and vomiting   Complete by:  As directed    Call MD for:  redness, tenderness, or signs of infection (pain, swelling, redness, odor or green/yellow discharge around incision site)   Complete by:   As directed    Call MD for:  severe uncontrolled pain   Complete by:  As directed    Call MD for:  temperature >100.4   Complete by:  As directed    Diet - low sodium heart healthy   Complete by:  As directed    Increase activity slowly   Complete by:  As directed      Allergies as of 12/15/2017   No Known Allergies     Medication List    TAKE these medications   cyclobenzaprine 5 MG tablet Commonly known as:  FLEXERIL Take 1 tablet (5 mg total) by mouth 3 (three) times daily as needed for muscle spasms.   escitalopram 10 MG tablet Commonly known as:  LEXAPRO Take 10 mg by mouth daily.   furosemide 40 MG tablet Commonly known as:  LASIX Take 40 mg by mouth 2 (two) times daily.   lactulose 10 GM/15ML solution Commonly known as:  CHRONULAC Take 10 g by mouth 3 (three) times daily as needed for mild constipation or moderate constipation.   linagliptin 5 MG Tabs tablet Commonly known as:  TRADJENTA Take 5 mg by mouth daily.   metFORMIN 500 MG tablet Commonly known as:  GLUCOPHAGE Take 500 mg by mouth 2 (two) times daily.   multivitamin with minerals Tabs tablet Take 1 tablet by mouth daily.   Oxycodone HCl 20 MG Tabs Take 1 tablet (20  mg total) by mouth every 6 (six) hours as needed. Start taking on:  12/17/2017 What changed:  reasons to take this   Oxycodone HCl 20 MG Tabs Take 1 tablet (20 mg total) by mouth every 6 (six) hours. Start taking on:  02/15/2018 What changed:  Another medication with the same name was changed. Make sure you understand how and when to take each.   OZEMPIC (1 MG/DOSE) 2 MG/1.5ML Sopn Generic drug:  Semaglutide (1 MG/DOSE) Inject 1 mg into the skin every Tuesday.   pantoprazole 40 MG tablet Commonly known as:  PROTONIX Take 40 mg by mouth 2 (two) times daily.   pregabalin 200 MG capsule Commonly known as:  LYRICA Take 200 mg by mouth 2 (two) times daily.   spironolactone 50 MG tablet Commonly known as:  ALDACTONE Take 100 mg  by mouth daily.         Electronically Signed: Earley Abide, PA-C 12/15/2017, 10:27 AM   I have spent Less Than 30 Minutes discharging [name].

## 2017-12-19 ENCOUNTER — Encounter (HOSPITAL_COMMUNITY): Payer: Self-pay | Admitting: Diagnostic Radiology

## 2018-01-05 ENCOUNTER — Other Ambulatory Visit: Payer: Medicare Other

## 2018-01-31 ENCOUNTER — Ambulatory Visit
Admission: RE | Admit: 2018-01-31 | Discharge: 2018-01-31 | Disposition: A | Discharge status: Home / Self Care | Payer: Medicare Other | Source: Ambulatory Visit | Attending: Oncology | Admitting: Oncology

## 2018-01-31 DIAGNOSIS — N2889 Other specified disorders of kidney and ureter: Secondary | ICD-10-CM | POA: Insufficient documentation

## 2018-01-31 DIAGNOSIS — J439 Emphysema, unspecified: Secondary | ICD-10-CM | POA: Insufficient documentation

## 2018-01-31 DIAGNOSIS — K8020 Calculus of gallbladder without cholecystitis without obstruction: Secondary | ICD-10-CM | POA: Insufficient documentation

## 2018-01-31 DIAGNOSIS — C3411 Malignant neoplasm of upper lobe, right bronchus or lung: Secondary | ICD-10-CM | POA: Insufficient documentation

## 2018-01-31 DIAGNOSIS — R918 Other nonspecific abnormal finding of lung field: Secondary | ICD-10-CM | POA: Diagnosis present

## 2018-01-31 DIAGNOSIS — I700 Atherosclerosis of aorta: Secondary | ICD-10-CM | POA: Diagnosis not present

## 2018-01-31 LAB — POCT I-STAT CREATININE: Creatinine, Ser: 0.7 mg/dL (ref 0.44–1.00)

## 2018-01-31 MED ORDER — IOPAMIDOL (ISOVUE-370) INJECTION 76%
100.0000 mL | Freq: Once | INTRAVENOUS | Status: AC | PRN
  Administered 2018-01-31: 100 mL via INTRAVENOUS

## 2018-02-02 ENCOUNTER — Other Ambulatory Visit (HOSPITAL_COMMUNITY): Payer: Self-pay | Admitting: Diagnostic Radiology

## 2018-02-02 ENCOUNTER — Ambulatory Visit: Payer: Medicare Other | Admitting: Oncology

## 2018-02-02 ENCOUNTER — Inpatient Hospital Stay: Payer: Medicare Other | Attending: Oncology

## 2018-02-02 ENCOUNTER — Other Ambulatory Visit: Payer: Self-pay | Admitting: *Deleted

## 2018-02-02 ENCOUNTER — Inpatient Hospital Stay (HOSPITAL_BASED_OUTPATIENT_CLINIC_OR_DEPARTMENT_OTHER): Payer: Medicare Other | Admitting: Oncology

## 2018-02-02 ENCOUNTER — Encounter: Payer: Self-pay | Admitting: Oncology

## 2018-02-02 ENCOUNTER — Other Ambulatory Visit: Payer: Medicare Other

## 2018-02-02 VITALS — BP 100/66 | HR 80 | Temp 98.1°F | Resp 18 | Ht 70.0 in | Wt 169.2 lb

## 2018-02-02 DIAGNOSIS — D500 Iron deficiency anemia secondary to blood loss (chronic): Secondary | ICD-10-CM

## 2018-02-02 DIAGNOSIS — C3411 Malignant neoplasm of upper lobe, right bronchus or lung: Secondary | ICD-10-CM

## 2018-02-02 DIAGNOSIS — R918 Other nonspecific abnormal finding of lung field: Secondary | ICD-10-CM | POA: Insufficient documentation

## 2018-02-02 DIAGNOSIS — D509 Iron deficiency anemia, unspecified: Secondary | ICD-10-CM | POA: Diagnosis present

## 2018-02-02 DIAGNOSIS — B1920 Unspecified viral hepatitis C without hepatic coma: Secondary | ICD-10-CM | POA: Insufficient documentation

## 2018-02-02 DIAGNOSIS — C641 Malignant neoplasm of right kidney, except renal pelvis: Secondary | ICD-10-CM

## 2018-02-02 DIAGNOSIS — E1140 Type 2 diabetes mellitus with diabetic neuropathy, unspecified: Secondary | ICD-10-CM | POA: Insufficient documentation

## 2018-02-02 DIAGNOSIS — C649 Malignant neoplasm of unspecified kidney, except renal pelvis: Secondary | ICD-10-CM | POA: Insufficient documentation

## 2018-02-02 DIAGNOSIS — F17210 Nicotine dependence, cigarettes, uncomplicated: Secondary | ICD-10-CM | POA: Diagnosis not present

## 2018-02-02 LAB — IRON AND TIBC
Iron: 75 ug/dL (ref 28–170)
Saturation Ratios: 21 % (ref 10.4–31.8)
TIBC: 360 ug/dL (ref 250–450)
UIBC: 285 ug/dL

## 2018-02-02 LAB — COMPREHENSIVE METABOLIC PANEL
ALT: 34 U/L (ref 0–44)
AST: 64 U/L — ABNORMAL HIGH (ref 15–41)
Albumin: 3.7 g/dL (ref 3.5–5.0)
Alkaline Phosphatase: 67 U/L (ref 38–126)
Anion gap: 9 (ref 5–15)
BUN: 6 mg/dL (ref 6–20)
CO2: 24 mmol/L (ref 22–32)
Calcium: 9.1 mg/dL (ref 8.9–10.3)
Chloride: 103 mmol/L (ref 98–111)
Creatinine, Ser: 0.57 mg/dL (ref 0.44–1.00)
GFR calc Af Amer: >60 mL/min (ref >60)
GFR calc non Af Amer: >60 mL/min (ref >60)
Glucose, Bld: 154 mg/dL — ABNORMAL HIGH (ref 70–99)
Potassium: 4.9 mmol/L (ref 3.5–5.1)
Sodium: 136 mmol/L (ref 135–145)
Total Bilirubin: 0.9 mg/dL (ref 0.3–1.2)
Total Protein: 7.2 g/dL (ref 6.5–8.1)

## 2018-02-02 LAB — CBC WITH DIFFERENTIAL/PLATELET
Abs Immature Granulocytes: 0.02 10*3/uL (ref 0.00–0.07)
Basophils Absolute: 0 10*3/uL (ref 0.0–0.1)
Basophils Relative: 1 %
Eosinophils Absolute: 0.1 10*3/uL (ref 0.0–0.5)
Eosinophils Relative: 2 %
HCT: 36.3 % (ref 36.0–46.0)
Hemoglobin: 12 g/dL (ref 12.0–15.0)
Immature Granulocytes: 0 %
Lymphocytes Relative: 23 %
Lymphs Abs: 1.4 10*3/uL (ref 0.7–4.0)
MCH: 34 pg (ref 26.0–34.0)
MCHC: 33.1 g/dL (ref 30.0–36.0)
MCV: 102.8 fL — ABNORMAL HIGH (ref 80.0–100.0)
Monocytes Absolute: 0.8 10*3/uL (ref 0.1–1.0)
Monocytes Relative: 13 %
Neutro Abs: 3.7 10*3/uL (ref 1.7–7.7)
Neutrophils Relative %: 61 %
Platelets: 144 10*3/uL — ABNORMAL LOW (ref 150–400)
RBC: 3.53 MIL/uL — ABNORMAL LOW (ref 3.87–5.11)
RDW: 13.1 % (ref 11.5–15.5)
WBC: 6 10*3/uL (ref 4.0–10.5)
nRBC: 0 % (ref 0.0–0.2)

## 2018-02-02 LAB — FERRITIN: Ferritin: 20 ng/mL (ref 11–307)

## 2018-02-02 NOTE — Progress Notes (Signed)
No new changes noted today 

## 2018-02-03 ENCOUNTER — Telehealth: Payer: Self-pay | Admitting: *Deleted

## 2018-02-03 DIAGNOSIS — D509 Iron deficiency anemia, unspecified: Secondary | ICD-10-CM | POA: Insufficient documentation

## 2018-02-03 NOTE — Progress Notes (Signed)
Hematology/Oncology Consult note [facility]  Telephone:([phone] [phone]  Patient Care Team: Jodi Marble, MD as PCP - General (Internal Medicine)   Name of the patient: Audrey Garrett  [id number]  [birth year]   Date of visit: 02/03/18  Diagnosis- 1. Iron deficiency anemia  2. Renal cell carcinoma  3. Stage I lung cancer [id number]  4. Bilateral lung nodule- ? Low grade multifocal adenocarcinoma  Chief complaint/ Reason for visit-discuss CT scan results and further management  Heme/Onc history: Patient is a 57 year old female with a h/o liver cirrhosis and is currently receiving Harvoni for hepatitis C. GI evaluation is as follows:  Upper EGD on 04/01/16 showed multiple dispersed diminutive erosions were found in the gastric antrum. There were no stigmata of recent bleeding. One healing cratered gastric ulcer with no stigmata of bleeding was found in the prepyloric region of the stomach. The lesion was 2 mm in largest dimension. Colonoscopy on 04/01/16 showed multiple small-mouthed diverticula were found in the sigmoid colon and descending colon. A single small localized angioectasia with typical arborization was found in the proximal ascending colon. Coagulation for tissue destruction using argon plasma at 0.5 liters/minute and 20 watts was successful.  She has received significant blood transfusions recently since March 2017. Her last blood transfusion was on 03/17/16. She had 2 units of blood She received 4 doses of IV Venofer  In Jan 2018 she received blood trasnsfusion followed by ferraheme.   During her visit in February 2018 her H&H was 10.3/ 31.6. IV iron was held off and she was supposed to get cbc checked in1 month. Patient missed that appointment and became progressively iron deficient again when her hb dropped to 7.9. She received 1 unit of blood transfusion  Patient continued to feel fatigued despite improvement in her anemia.  Given her strong h/o smoking CT lung cancer screenign was obtained which showed: IMPRESSION: 11.6 mm spiculated right lung nodule just anterior to the major fissure with associated fissure all retraction. Lung-RADS Category 4A, suspicious. Follow up low-dose chest CT without contrast in 3 months (please use the following order, "CT CHEST LCS NODULE FOLLOW-UP W/O CM") is recommended. Alternatively, PET may be considered when there is a solid component 38mm or larger.  Other bilateral pulmonary nodules evident. Attention on follow-up.   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 She did not wish to pursue surgery  She sees DR. Erlene Quan for her renal mass. She is not a candidate for surgery and did not desire cryoablation.   Interval history-she has some baseline fatigue which is remained unchanged.  Appetite is fair and she denies any unintentional weight loss.  Angular cheilitis has improved  ECOG PS- 1 Pain scale- 0 Opioid associated constipation- no  Review of systems- Review of Systems  Constitutional: Positive for malaise/fatigue. Negative for chills, fever and weight loss.  HENT: Negative for congestion, ear discharge and nosebleeds.   Eyes: Negative for blurred vision.  Respiratory: Negative for cough, hemoptysis, sputum production, shortness of breath and wheezing.   Cardiovascular: Negative for chest pain, palpitations, orthopnea and claudication.  Gastrointestinal: Negative for abdominal pain, blood in stool, constipation, diarrhea, heartburn, melena, nausea and vomiting.  Genitourinary: Negative for dysuria, flank pain, frequency, hematuria and urgency.  Musculoskeletal: Negative for back pain, joint pain and myalgias.  Skin: Negative for rash.  Neurological: Negative for dizziness, tingling, focal  weakness, seizures, weakness and headaches.    Endo/Heme/Allergies: Does not bruise/bleed easily.  Psychiatric/Behavioral: Negative for depression and suicidal ideas. The patient does not have insomnia.       No Known Allergies   Past Medical History:  Diagnosis Date  . Alcohol abuse   . Alcoholic cirrhosis ([place])    GI-- dr skulskie/  hx ascites 2017   . Anxiety   . Chronic hepatitis C ([place])    treated w/ harvoni  . Chronic LBP 12/25/2014   Overview:  Post extensive surgery   . Chronic pain syndrome    neck , upper back, post extensive spinal surgery  . DDD (degenerative disc disease), lumbar   . Diverticulosis   . Emphysema lung ([place])   . Fibromyalgia   . Full dentures   . GERD (gastroesophageal reflux disease)    gastroparesis  . Hiatal hernia   . History of GI bleed    06/ 2017  ulcers and gastric erosions, transfused x4/  10/ 2017 unknown source/  01/ 2018  transfused x2 units and IV Iron   . Hypertension   . Iron deficiency anemia due to chronic blood loss    hemotologist-- dr Alvy Bimler  . Lumbar facet joint syndrome   . Lumbar spondylosis   . Lung cancer ([place]) 11/25/2016   right upper lobe nodule , Stage I,  treatment SBRT  . MDD (major depressive disorder)   . Nephrolithiasis    per CT 09-30-2017 right side nonobstructive  . OA (osteoarthritis of spine)   . Polyarthritis   . Polyneuropathy   . Restless leg syndrome   . Right renal mass   . RLS (restless legs syndrome)   . SOB (shortness of breath)   . Type 2 diabetes mellitus ([place])    followed by pcp  . Urinary frequency   . Vitamin D deficiency disease      Past Surgical History:  Procedure Laterality Date  . COLONOSCOPY WITH PROPOFOL N/A 09/25/2015   Procedure: COLONOSCOPY WITH PROPOFOL;  Surgeon: Lollie Sails, MD;  Location: [facility] ENDOSCOPY;  Service: Endoscopy;  Laterality: N/A;  . COLONOSCOPY WITH PROPOFOL N/A 04/01/2016   Procedure: COLONOSCOPY WITH PROPOFOL;  Surgeon: Lollie Sails, MD;  Location: [facility] ENDOSCOPY;  Service: Endoscopy;   Laterality: N/A;  . ELECTROMAGNETIC NAVIGATION BROCHOSCOPY N/A 11/25/2016   Procedure: ELECTROMAGNETIC NAVIGATION BRONCHOSCOPY;  Surgeon: Flora Lipps, MD;  Location: [facility] ORS;  Service: Cardiopulmonary;  Laterality: N/A;  . ENDOMETRIAL ABLATION  2007  . ESOPHAGOGASTRODUODENOSCOPY N/A 04/14/2015   Procedure: ESOPHAGOGASTRODUODENOSCOPY (EGD);  Surgeon: Lollie Sails, MD;  Location: [facility] ENDOSCOPY;  Service: Endoscopy;  Laterality: N/A;  . ESOPHAGOGASTRODUODENOSCOPY (EGD) WITH PROPOFOL N/A 09/16/2015   Procedure: ESOPHAGOGASTRODUODENOSCOPY (EGD) WITH PROPOFOL;  Surgeon: Lollie Sails, MD;  Location: [facility] ENDOSCOPY;  Service: Endoscopy;  Laterality: N/A;  . ESOPHAGOGASTRODUODENOSCOPY (EGD) WITH PROPOFOL N/A 01/22/2016   Procedure: ESOPHAGOGASTRODUODENOSCOPY (EGD) WITH PROPOFOL;  Surgeon: [name], MD;  Location: [place];  Service: Endoscopy;  Laterality: N/A;  . ESOPHAGOGASTRODUODENOSCOPY (EGD) WITH PROPOFOL N/A 04/01/2016   Procedure: ESOPHAGOGASTRODUODENOSCOPY (EGD) WITH PROPOFOL;  Surgeon: Lollie Sails, MD;  Location: [facility] ENDOSCOPY;  Service: Endoscopy;  Laterality: N/A;  . IR RADIOLOGIST EVAL & MGMT  04/13/2017  . IR RADIOLOGIST EVAL & MGMT  11/15/2017  . RADIOLOGY WITH ANESTHESIA Right 12/14/2017   Procedure: CT MICROWAVE THERMAL ABLATION AND BIOPSY WITH ANESTHESIA;  Surgeon: Markus Daft, MD;  Location: [facility] ORS;  Service: Anesthesiology;  Laterality: Right;  . SPINAL FUSION  12/19/2011   T5 to sacrum (rod, screws)  . TONSILLECTOMY  age 31  . TUBAL LIGATION Bilateral yrs ago  . UMBILICAL HERNIA REPAIR N/A 11/06/2015   Procedure: HERNIA REPAIR UMBILICAL ADULT;  Surgeon: [name], MD;  Location: [facility] ORS;  Service: General;  Laterality: N/A;    Social History   Socioeconomic History  . Marital status: Married    Spouse name: Not on file  . Number of children: Not on file  . Years of education: Not on file  . Highest education level: Not on file  Occupational  History  . Not on file  Social Needs  . Financial resource strain: Not on file  . Food insecurity:    Worry: Not on file    Inability: Not on file  . Transportation needs:    Medical: Not on file    Non-medical: Not on file  Tobacco Use  . Smoking status: Current Every Day Smoker    Packs/day: 0.50    Years: 45.00    Pack years: 22.50    Types: Cigarettes  . Smokeless tobacco: Never Used  . Tobacco comment: 12-05-2017 2ppd until 2017 down to 1/2 ppd  Substance and Sexual Activity  . Alcohol use: Yes    Alcohol/week: 12.0 standard drinks    Types: 12 Cans of beer per week    Comment: 12 pack beer per week (7 oz each)  . Drug use: No  . Sexual activity: Not on file  Lifestyle  . Physical activity:    Days per week: Not on file    Minutes per session: Not on file  . Stress: Not on file  Relationships  . Social connections:    Talks on phone: Not on file    Gets together: Not on file    Attends religious service: Not on file    Active member of club or organization: Not on file    Attends meetings of clubs or organizations: Not on file    Relationship status: Not on file  . Intimate partner violence:    Fear of current or ex partner: Not on file    Emotionally abused: Not on file    Physically abused: Not on file    Forced sexual activity: Not on file  Other Topics Concern  . Not on file  Social History Narrative   Lives at home with husband    Family History  Problem Relation Age of Onset  . Stroke Mother   . Heart disease Mother   . [name] Father   . Breast cancer Sister 88  . Kidney disease Neg Hx   . Bladder Cancer Neg Hx   . Kidney cancer Neg Hx   . Prostate cancer Neg Hx      Current Outpatient Medications:  .  escitalopram (LEXAPRO) 10 MG tablet, Take 10 mg by mouth daily. , Disp: , Rfl:  .  furosemide (LASIX) 40 MG tablet, Take 40 mg by mouth 2 (two) times daily. , Disp: , Rfl:  .  linagliptin (TRADJENTA) 5 MG TABS tablet, Take 5 mg by mouth  daily., Disp: , Rfl:  .  metFORMIN (GLUCOPHAGE) 500 MG tablet, Take 500 mg by mouth 2 (two) times daily. , Disp: , Rfl:  .  Multiple Vitamin (MULTIVITAMIN WITH MINERALS) TABS tablet, Take 1 tablet by mouth daily., Disp: , Rfl:  .  [START ON 02/15/2018] Oxycodone HCl 20 MG TABS, Take 1 tablet (20 mg total) by mouth every 6 (six) hours., Disp:  120 tablet, Rfl: 0 .  OZEMPIC 1 MG/DOSE SOPN, Inject 1 mg into the skin every Tuesday., Disp: , Rfl:  .  pantoprazole (PROTONIX) 40 MG tablet, Take 40 mg by mouth 2 (two) times daily. , Disp: , Rfl: 11 .  pregabalin (LYRICA) 200 MG capsule, Take 200 mg by mouth 2 (two) times daily., Disp: , Rfl:  .  spironolactone (ALDACTONE) 50 MG tablet, Take 100 mg by mouth daily. , Disp: , Rfl:  .  cyclobenzaprine (FLEXERIL) 5 MG tablet, Take 1 tablet (5 mg total) by mouth 3 (three) times daily as needed for muscle spasms. (Patient not taking: Reported on 02/02/2018), Disp: 90 tablet, Rfl: 2 .  lactulose (CHRONULAC) 10 GM/15ML solution, Take 10 g by mouth 3 (three) times daily as needed for mild constipation or moderate constipation. , Disp: , Rfl:  .  Oxycodone HCl 20 MG TABS, Take 1 tablet (20 mg total) by mouth every 6 (six) hours as needed. (Patient taking differently: Take 1 tablet by mouth every 6 (six) hours as needed (for chronic pain.). ), Disp: 120 tablet, Rfl: 0  Physical exam:  Vitals:   02/02/18 0950  BP: 100/66  Pulse: 80  Resp: 18  Temp: 98.1 F (36.7 C)  TempSrc: Tympanic  Weight: 169 lb 3.2 oz (76.7 kg)  Height: 5\' 10"  (1.778 m)   Physical Exam  Constitutional: She is oriented to person, place, and time. She appears well-developed and well-nourished.  She appears fatigued  HENT:  Head: Normocephalic and atraumatic.  Eyes: Pupils are equal, round, and reactive to light. EOM are normal.  Neck: Normal range of motion.  Cardiovascular: Normal rate, regular rhythm and normal heart sounds.  Pulmonary/Chest: Effort normal and breath sounds normal.    Abdominal: Soft. Bowel sounds are normal.  Neurological: She is alert and oriented to person, place, and time.  Skin: Skin is warm and dry.  Scattered bruising over bilateral forearms     CMP Latest Ref Rng & Units 02/02/2018  Glucose 70 - 99 mg/dL 154(H)  BUN 6 - 20 mg/dL 6  Creatinine 0.44 - 1.00 mg/dL 0.57  Sodium 135 - 145 mmol/L 136  Potassium 3.5 - 5.1 mmol/L 4.9  Chloride 98 - 111 mmol/L 103  CO2 22 - 32 mmol/L 24  Calcium 8.9 - 10.3 mg/dL 9.1  Total Protein 6.5 - 8.1 g/dL 7.2  Total Bilirubin 0.3 - 1.2 mg/dL 0.9  Alkaline Phos 38 - 126 U/L 67  AST 15 - 41 U/L 64(H)  ALT 0 - 44 U/L 34   CBC Latest Ref Rng & Units 02/02/2018  WBC 4.0 - 10.5 K/uL 6.0  Hemoglobin 12.0 - 15.0 g/dL 12.0  Hematocrit 36.0 - 46.0 % 36.3  Platelets 150 - 400 K/uL 144(L)    No images are attached to the encounter.  Ct Chest W Contrast  Result Date: 01/31/2018 CLINICAL DATA:  Malignant neoplasm of the right upper lobe. EXAM: CT CHEST AND ABDOMEN WITH CONTRAST TECHNIQUE: Multidetector CT imaging of the chest and abdomen was performed following the standard protocol during bolus administration of intravenous contrast. CONTRAST:  122mL ISOVUE-370 IOPAMIDOL (ISOVUE-370) INJECTION 76% COMPARISON:  09/30/2017 FINDINGS: CT CHEST FINDINGS Cardiovascular: The heart size appears within normal limits. There is no pericardial effusion identified. Aortic atherosclerosis. Mediastinum/Nodes: Normal appearance of the thyroid gland. The trachea appears patent and is midline. Normal appearance of the esophagus. No enlarged mediastinal or hilar lymph nodes. Lungs/Pleura: No pleural effusion identified. Moderate change of emphysema. Multiple sub solid lesions  are again noted scattered throughout both lungs. -the index lesion within the lateral right upper lobe measures 2 by 1.4 cm, image 39/3. Unchanged. -the index 2.2 cm left lower lobe ground-glass nodule has resolved in the interval compatible with an  inflammatory/infectious process. - within the posteromedial left lower lobe there is a 1.1 cm ground-glass nodule, image 94/3. Unchanged. -Within the anterior left upper lobe there is a part solid nodule measuring 9 mm, image 31/3. Unchanged from previous exam. Post treatment changes with perihilar masslike architectural distortion, fibrosis and ground-glass attenuation noted the in the right midlung. The underlying index nodular density measures 8 mm on today's study, image 77/3. Previously 9 mm. Musculoskeletal: Previous posterior hardware fixation of the thoracic spine. No acute or suspicious bone lesions identified. CT ABDOMEN FINDINGS Hepatobiliary: There is diffuse hepatic steatosis. The contour of the liver is slightly irregular and there is hypertrophy of the lateral segment of left lobe. Imaging findings are worrisome for cirrhosis. No enhancing liver lesions identified. Stones identified within the dependent portion of the gallbladder measuring up to 4 mm. No biliary ductal dilatation identified. Pancreas: Normal appearance of the pancreas. Spleen: Spleen unremarkable. Adrenals/Urinary Tract: Normal adrenal glands. Microwave ablation defect within the upper pole of right kidney identified measuring 3.4 by 2.2 cm, image 34/6. No suspicious enhancement within the ablation zone to suggest residual tumor. No perinephric fluid collections or hematoma identified. No hydronephrosis. Several small hypodensities within the left kidney are identified and are too small to characterize. Stomach/Bowel: Stomach is within normal limits. No evidence of bowel wall thickening, distention, or inflammatory changes. Vascular/Lymphatic: Aortic atherosclerosis. No aneurysm. No upper abdominal adenopathy identified. Other: No free fluid or fluid collections. Musculoskeletal: Posterior hardware fixation of the lumbar spine and sacrum noted. No acute or suspicious bone lesions identified. IMPRESSION: 1. Status post microwave  ablation of right upper pole renal no complicating features identified. No findings identified to suggest residual tumor. 2. Again seen are multiple sub solid lesions within both lungs. The appearance is unchanged when compared with the previous exam. 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 Stable post treatment changes within the right midlung compatible with external beam radiation. Underlying nodular density is stable when compared with previous exam. 4. Morphologic features of liver concerning for cirrhosis. 5. Gallstones. 6. Aortic Atherosclerosis (ICD10-I70.0) and Emphysema (ICD10-J43.9). Electronically Signed   By: Kerby Moors M.D.   On: 01/31/2018 11:28   Ct Abdomen W Wo Contrast  Result Date: 01/31/2018 CLINICAL DATA:  Malignant neoplasm of the right upper lobe. EXAM: CT CHEST AND ABDOMEN WITH CONTRAST TECHNIQUE: Multidetector CT imaging of the chest and abdomen was performed following the standard protocol during bolus administration of intravenous contrast. CONTRAST:  1103mL ISOVUE-370 IOPAMIDOL (ISOVUE-370) INJECTION 76% COMPARISON:  09/30/2017 FINDINGS: CT CHEST FINDINGS Cardiovascular: The heart size appears within normal limits. There is no pericardial effusion identified. Aortic atherosclerosis. Mediastinum/Nodes: Normal appearance of the thyroid gland. The trachea appears patent and is midline. Normal appearance of the esophagus. No enlarged mediastinal or hilar lymph nodes. Lungs/Pleura: No pleural effusion identified. Moderate change of emphysema. Multiple sub solid lesions are again noted scattered throughout both lungs. -the index lesion within the lateral right upper lobe measures 2 by 1.4 cm, image 39/3. Unchanged. -the index 2.2 cm left lower lobe ground-glass nodule has resolved in the interval compatible with an inflammatory/infectious process. - within the posteromedial left lower lobe there is a 1.1 cm ground-glass nodule, image 94/3.  Unchanged. -Within the anterior left  upper lobe there is a part solid nodule measuring 9 mm, image 31/3. Unchanged from previous exam. Post treatment changes with perihilar masslike architectural distortion, fibrosis and ground-glass attenuation noted the in the right midlung. The underlying index nodular density measures 8 mm on today's study, image 77/3. Previously 9 mm. Musculoskeletal: Previous posterior hardware fixation of the thoracic spine. No acute or suspicious bone lesions identified. CT ABDOMEN FINDINGS Hepatobiliary: There is diffuse hepatic steatosis. The contour of the liver is slightly irregular and there is hypertrophy of the lateral segment of left lobe. Imaging findings are worrisome for cirrhosis. No enhancing liver lesions identified. Stones identified within the dependent portion of the gallbladder measuring up to 4 mm. No biliary ductal dilatation identified. Pancreas: Normal appearance of the pancreas. Spleen: Spleen unremarkable. Adrenals/Urinary Tract: Normal adrenal glands. Microwave ablation defect within the upper pole of right kidney identified measuring 3.4 by 2.2 cm, image 34/6. No suspicious enhancement within the ablation zone to suggest residual tumor. No perinephric fluid collections or hematoma identified. No hydronephrosis. Several small hypodensities within the left kidney are identified and are too small to characterize. Stomach/Bowel: Stomach is within normal limits. No evidence of bowel wall thickening, distention, or inflammatory changes. Vascular/Lymphatic: Aortic atherosclerosis. No aneurysm. No upper abdominal adenopathy identified. Other: No free fluid or fluid collections. Musculoskeletal: Posterior hardware fixation of the lumbar spine and sacrum noted. No acute or suspicious bone lesions identified. IMPRESSION: 1. Status post microwave ablation of right upper pole renal no complicating features identified. No findings identified to suggest residual tumor. 2. Again  seen are multiple sub solid lesions within both lungs. The appearance is unchanged when compared with the previous exam. 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 Stable post treatment changes within the right midlung compatible with external beam radiation. Underlying nodular density is stable when compared with previous exam. 4. Morphologic features of liver concerning for cirrhosis. 5. Gallstones. 6. Aortic Atherosclerosis (ICD10-I70.0) and Emphysema (ICD10-J43.9). Electronically Signed   By: Kerby Moors M.D.   On: 01/31/2018 11:28     Assessment and plan- Patient is a 57 y.o. female with following issues:  1.  Iron deficiency anemia: Today her hemoglobin is stable at 12.  Iron studies however show low ferritin of 20.  We will therefore schedule her for 2 doses of Feraheme.  Repeat CBC ferritin and iron studies in 3 in 6 months and she will see me back in 6 months.  2.  History of renal mass concerning for renal cell carcinoma: Status post cryoablation.  Continue to monitor repeat CT abdomen with and without contrast in 6 months  3.  Lung nodules: She had a right upper lobe 1.4 cm mass which was biopsied in the past and was diagnostic of adenocarcinoma and received radiation for the same.  She also has other bilateral groundglass opacities including a left lower lobe groundglass nodule of 2.2 cm which has remained essentially unchanged since January 2019 I have reviewed the CT chest images independently as well and there is no significant change in the size of these pulmonary nodules.  Plan at this point is to hold off on the repeat left lower lobe lung biopsy and repeat CT chest with contrast in 6 months time and I will see her thereafter  4.  Patient has a history of hep C and cirrhosis and follows up at [facility]   Visit Diagnosis 1. Malignant neoplasm of kidney, unspecified laterality (HCC)   2. Lung nodules  3. Iron deficiency anemia,  unspecified iron deficiency anemia type   4. Malignant neoplasm of upper lobe of right lung [facility])      Dr. Randa Evens, MD, MPH [facility] at [facility] [phone] 02/03/2018 11:34 AM

## 2018-02-03 NOTE — Telephone Encounter (Signed)
Called patient to let her know that her ferritin was at 20 and Dr. Janese Banks recommended that she get 2 doses of Feraheme.  Patient agreeable to plan and asked whether not if it is can I make her feel like she has more energy.  I told her that it does for some people but not always.  I also asked the patient in the past when she has had the Feraheme did not help with her energy and she said no.  She is still agreeable to coming over and getting to ferriheme treatments.  I have put in a scheduling staff message to get her set up next week for her first 1.

## 2018-02-08 ENCOUNTER — Other Ambulatory Visit: Payer: Self-pay | Admitting: Podiatry

## 2018-02-08 ENCOUNTER — Telehealth: Payer: Self-pay | Admitting: *Deleted

## 2018-02-08 NOTE — Telephone Encounter (Signed)
Contacted Audrey Garrett to schedule f/u with Dr. Anselm Pancoast MWA. She was a n/s in Oct and Nov. Today she states she doesn't feel the need to f/u she is feeling good.Cathren Harsh

## 2018-02-10 ENCOUNTER — Inpatient Hospital Stay: Payer: Medicare Other

## 2018-02-10 VITALS — BP 105/74 | HR 83 | Temp 97.0°F | Resp 20

## 2018-02-10 DIAGNOSIS — D509 Iron deficiency anemia, unspecified: Secondary | ICD-10-CM | POA: Diagnosis not present

## 2018-02-10 DIAGNOSIS — D500 Iron deficiency anemia secondary to blood loss (chronic): Secondary | ICD-10-CM

## 2018-02-10 MED ORDER — SODIUM CHLORIDE 0.9 % IV SOLN
Freq: Once | INTRAVENOUS | Status: AC
  Administered 2018-02-10: 11:00:00 via INTRAVENOUS
  Filled 2018-02-10: qty 250

## 2018-02-10 MED ORDER — SODIUM CHLORIDE 0.9 % IV SOLN
510.0000 mg | Freq: Once | INTRAVENOUS | Status: AC
  Administered 2018-02-10: 510 mg via INTRAVENOUS
  Filled 2018-02-10: qty 17

## 2018-02-17 ENCOUNTER — Inpatient Hospital Stay: Payer: Medicare Other

## 2018-02-17 VITALS — BP 99/67 | HR 68 | Temp 96.0°F | Resp 20

## 2018-02-17 DIAGNOSIS — D509 Iron deficiency anemia, unspecified: Secondary | ICD-10-CM

## 2018-02-17 DIAGNOSIS — D500 Iron deficiency anemia secondary to blood loss (chronic): Secondary | ICD-10-CM

## 2018-02-17 MED ORDER — SODIUM CHLORIDE 0.9 % IV SOLN
Freq: Once | INTRAVENOUS | Status: AC
  Administered 2018-02-17: 15:00:00 via INTRAVENOUS
  Filled 2018-02-17: qty 250

## 2018-02-17 MED ORDER — SODIUM CHLORIDE 0.9 % IV SOLN
510.0000 mg | Freq: Once | INTRAVENOUS | Status: AC
  Administered 2018-02-17: 510 mg via INTRAVENOUS
  Filled 2018-02-17: qty 17

## 2018-02-27 ENCOUNTER — Ambulatory Visit: Payer: Medicare Other | Attending: Nurse Practitioner | Admitting: Nurse Practitioner

## 2018-02-27 ENCOUNTER — Encounter: Payer: Self-pay | Admitting: Nurse Practitioner

## 2018-02-27 ENCOUNTER — Other Ambulatory Visit: Payer: Self-pay

## 2018-02-27 VITALS — BP 107/71 | HR 78 | Temp 98.8°F | Resp 18 | Ht 70.0 in | Wt 168.0 lb

## 2018-02-27 DIAGNOSIS — M47816 Spondylosis without myelopathy or radiculopathy, lumbar region: Secondary | ICD-10-CM | POA: Diagnosis not present

## 2018-02-27 DIAGNOSIS — F329 Major depressive disorder, single episode, unspecified: Secondary | ICD-10-CM | POA: Insufficient documentation

## 2018-02-27 DIAGNOSIS — M533 Sacrococcygeal disorders, not elsewhere classified: Secondary | ICD-10-CM | POA: Insufficient documentation

## 2018-02-27 DIAGNOSIS — Z760 Encounter for issue of repeat prescription: Secondary | ICD-10-CM | POA: Diagnosis not present

## 2018-02-27 DIAGNOSIS — Z5181 Encounter for therapeutic drug level monitoring: Secondary | ICD-10-CM | POA: Diagnosis present

## 2018-02-27 DIAGNOSIS — M4726 Other spondylosis with radiculopathy, lumbar region: Secondary | ICD-10-CM | POA: Insufficient documentation

## 2018-02-27 DIAGNOSIS — M5412 Radiculopathy, cervical region: Secondary | ICD-10-CM | POA: Insufficient documentation

## 2018-02-27 DIAGNOSIS — G8929 Other chronic pain: Secondary | ICD-10-CM

## 2018-02-27 DIAGNOSIS — G2581 Restless legs syndrome: Secondary | ICD-10-CM | POA: Diagnosis not present

## 2018-02-27 DIAGNOSIS — B182 Chronic viral hepatitis C: Secondary | ICD-10-CM | POA: Diagnosis not present

## 2018-02-27 DIAGNOSIS — M549 Dorsalgia, unspecified: Secondary | ICD-10-CM | POA: Diagnosis not present

## 2018-02-27 DIAGNOSIS — M4802 Spinal stenosis, cervical region: Secondary | ICD-10-CM

## 2018-02-27 DIAGNOSIS — M546 Pain in thoracic spine: Secondary | ICD-10-CM | POA: Diagnosis not present

## 2018-02-27 DIAGNOSIS — Z7984 Long term (current) use of oral hypoglycemic drugs: Secondary | ICD-10-CM | POA: Diagnosis not present

## 2018-02-27 DIAGNOSIS — Z79891 Long term (current) use of opiate analgesic: Secondary | ICD-10-CM | POA: Diagnosis not present

## 2018-02-27 DIAGNOSIS — I10 Essential (primary) hypertension: Secondary | ICD-10-CM | POA: Diagnosis not present

## 2018-02-27 DIAGNOSIS — K219 Gastro-esophageal reflux disease without esophagitis: Secondary | ICD-10-CM | POA: Diagnosis not present

## 2018-02-27 DIAGNOSIS — M797 Fibromyalgia: Secondary | ICD-10-CM | POA: Insufficient documentation

## 2018-02-27 DIAGNOSIS — Z79899 Other long term (current) drug therapy: Secondary | ICD-10-CM | POA: Diagnosis not present

## 2018-02-27 DIAGNOSIS — G894 Chronic pain syndrome: Secondary | ICD-10-CM

## 2018-02-27 DIAGNOSIS — F419 Anxiety disorder, unspecified: Secondary | ICD-10-CM | POA: Insufficient documentation

## 2018-02-27 DIAGNOSIS — F17210 Nicotine dependence, cigarettes, uncomplicated: Secondary | ICD-10-CM | POA: Insufficient documentation

## 2018-02-27 MED ORDER — CYCLOBENZAPRINE HCL 5 MG PO TABS: 5.0000 mg | ORAL_TABLET | Freq: Three times a day (TID) | ORAL | 2 refills | Status: DC | PRN

## 2018-02-27 MED ORDER — OXYCODONE HCL 20 MG PO TABS: 1.0000 | ORAL_TABLET | Freq: Four times a day (QID) | ORAL | 0 refills | Status: DC | PRN

## 2018-02-27 MED ORDER — OXYCODONE HCL 20 MG PO TABS: 1.0000 | ORAL_TABLET | Freq: Four times a day (QID) | ORAL | 0 refills | Status: DC

## 2018-02-27 NOTE — Progress Notes (Signed)
Nursing Pain Medication Assessment:  Safety precautions to be maintained throughout the outpatient stay will include: orient to surroundings, keep bed in low position, maintain call bell within reach at all times, provide assistance with transfer out of bed and ambulation.  Medication Inspection Compliance: Pill count conducted under aseptic conditions, in front of the patient. Neither the pills nor the bottle was removed from the patient's sight at any time. Once count was completed pills were immediately returned to the patient in their original bottle.  Medication: Oxycodone HCL Pill/Patch Count: 68 of 120 pills remain Pill/Patch Appearance: Markings consistent with prescribed medication Bottle Appearance: Standard pharmacy container. Clearly labeled. Filled Date: 64 / 20 / 2019 Last Medication intake:  Today

## 2018-02-27 NOTE — Patient Instructions (Signed)
____________________________________________________________________________________________  Medication Rules  Purpose: To inform patients, and their family members, of our rules and regulations.  Applies to: All patients receiving prescriptions (written or electronic).  Pharmacy of record: Pharmacy where electronic prescriptions will be sent. If written prescriptions are taken to a different pharmacy, please inform the nursing staff. The pharmacy listed in the electronic medical record should be the one where you would like electronic prescriptions to be sent.  Electronic prescriptions: In compliance with the [place] Strengthen Opioid Misuse Prevention (STOP) Act of 2017 (Session Law 2017-74/H243), effective March 29, 2018, all controlled substances must be electronically prescribed. Calling prescriptions to the pharmacy will cease to exist.  Prescription refills: Only during scheduled appointments. Applies to all prescriptions.  NOTE: The following applies primarily to controlled substances (Opioid* Pain Medications).   [name] responsibilities: 1. Pain Pills: Bring all pain pills to every appointment (except for procedure appointments). 2. Pill Bottles: Bring pills in original pharmacy bottle. Always bring the newest bottle. Bring bottle, even if empty. 3. Medication refills: You are responsible for knowing and keeping track of what medications you take and those you need refilled. The day before your appointment: write a list of all prescriptions that need to be refilled. The day of the appointment: give the list to the admitting nurse. Prescriptions will be written only during appointments. If you forget a medication: it will not be "Called in", "Faxed", or "electronically sent". You will need to get another appointment to get these prescribed. No early refills. Do not call asking to have your prescription filled early. 4. Prescription Accuracy: You are responsible for  carefully inspecting your prescriptions before leaving our office. Have the discharge nurse carefully go over each prescription with you, before taking them home. Make sure that your name is accurately spelled, that your address is correct. Check the name and dose of your medication to make sure it is accurate. Check the number of pills, and the written instructions to make sure they are clear and accurate. Make sure that you are given enough medication to last until your next medication refill appointment. 5. Taking Medication: Take medication as prescribed. When it comes to controlled substances, taking less pills or less frequently than prescribed is permitted and encouraged. Never take more pills than instructed. Never take medication more frequently than prescribed.  6. Inform other Doctors: Always inform, all of your healthcare providers, of all the medications you take. 7. Pain Medication from other Providers: You are not allowed to accept any additional pain medication from any other Doctor or Healthcare provider. There are two exceptions to this rule. (see below) In the event that you require additional pain medication, you are responsible for notifying us, as stated below. 8. Medication Agreement: You are responsible for carefully reading and following our Medication Agreement. This must be signed before receiving any prescriptions from our practice. Safely store a copy of your signed Agreement. Violations to the Agreement will result in no further prescriptions. (Additional copies of our Medication Agreement are available upon request.) 9. Laws, Rules, & Regulations: All patients are expected to follow all Federal and State Laws, Statutes, Rules, & Regulations. Ignorance of the Laws does not constitute a valid excuse. The use of any illegal substances is prohibited. 10. Adopted CDC guidelines & recommendations: Target dosing levels will be at or below 60 MME/day. Use of benzodiazepines** is not  recommended.  Exceptions: There are only two exceptions to the rule of not receiving pain medications from other Healthcare Providers. 1.   Exception #1 (Emergencies): In the event of an emergency (i.e.: accident requiring emergency care), you are allowed to receive additional pain medication. However, you are responsible for: As soon as you are able, call our office [phone], at any time of the day or night, and leave a message stating your name, the date and nature of the emergency, and the name and dose of the medication prescribed. In the event that your call is answered by a member of our staff, make sure to document and save the date, time, and the name of the person that took your information.  2. Exception #2 (Planned Surgery): In the event that you are scheduled by another doctor or dentist to have any type of surgery or procedure, you are allowed (for a period no longer than 30 days), to receive additional pain medication, for the acute post-op pain. However, in this case, you are responsible for picking up a copy of our "Post-op Pain Management for Surgeons" handout, and giving it to your surgeon or dentist. This document is available at our office, and does not require an appointment to obtain it. Simply go to our office during business hours (Monday-Thursday from 8:00 AM to 4:00 PM) (Friday 8:00 AM to 12:00 Noon) or if you have a scheduled appointment with us, prior to your surgery, and ask for it by name. In addition, you will need to provide us with your name, name of your surgeon, type of surgery, and date of procedure or surgery.  *Opioid medications include: morphine, codeine, oxycodone, oxymorphone, hydrocodone, hydromorphone, meperidine, tramadol, tapentadol, buprenorphine, fentanyl, methadone. **Benzodiazepine medications include: diazepam (Valium), alprazolam (Xanax), clonazepam (Klonopine), lorazepam (Ativan), clorazepate (Tranxene), chlordiazepoxide (Librium), estazolam (Prosom),  oxazepam (Serax), temazepam (Restoril), triazolam (Halcion) (Last updated: 05/26/2017) ____________________________________________________________________________________________    

## 2018-02-27 NOTE — Progress Notes (Signed)
[name] Name: Audrey Garrett  MRN: [id number]  Referring Provider: Jodi Marble, MD  DOB: June 28, 1960  PCP: Audrey Marble, MD  DOS: 02/27/2018  Note by: Audrey Francois NP  Service setting: Ambulatory outpatient  Specialty: Interventional Pain Management  Location: [facility] (AMB) Pain Management Facility    Patient type: Established    Primary Reason(s) for Visit: Encounter for prescription drug management. (Level of risk: moderate)  CC: Medication Refill (Oxycodone 9m) and Back Pain  HPI  Audrey Garrett a 57y.o. year old, female patient, who comes today for a medication management evaluation. She has Insomnia, persistent; BP (high blood pressure); Cervical radicular pain; Uncomplicated opioid dependence ([place]; Long term prescription opiate use; Failed back surgical syndrome; Lumbar facet syndrome (Bilateral) (L>R); Osteoarthritis of spine with radiculopathy, lumbar region; Musculoskeletal pain; Chronic low back pain (Primary Source of Pain) (Bilateral) (L>R); HCV (hepatitis C virus); At risk for osteopenia; Lumbar spondylosis; Chronic lumbar radicular pain (Secondary source of pain) (Left); Chronic neck pain; Chronic lower extremity pain (Left); Chronic sacroiliac joint pain (Bilateral) (R>L); Chronic upper back pain; Long term current use of opiate analgesic; Encounter for chronic pain management; Hypomagnesemia (low magnesium levels); Hypotension; Anemia; Thrombocytopenia ([place]; Coagulopathy ([place]; Hyperglycemia; Polyneuropathy; Urinary retention; Difficulty urinating; Nephrolithiasis; Alcoholic cirrhosis of liver with ascites ([place]; Depression, major, recurrent, moderate (HD[place]; Abdominal pain, chronic, epigastric; Hepatic cirrhosis ([place]; Acute GI bleeding; Neurogenic pain; S/P thoracolumbar fusion (T5-L5); Grade 1 Retrolisthesis (4 mm) of C5 over C6 and C6 over C7; Cervical foraminal stenosis (Left C5-6; Bilateral C6-7); Hernia of anterior abdominal wall; Incarcerated umbilical hernia;  Controlled type 2 diabetes mellitus with hyperglycemia (HS[place]; Acute blood loss anemia; Chronic pain syndrome; Malnutrition of moderate degree; Iron deficiency anemia due to chronic blood loss; Opiate use; RLS (restless legs syndrome); Bilateral leg edema; Depression, major, in remission ([place]; Chronic hip pain (Bilateral); Osteoarthritis of hip (Bilateral); Personal history of tobacco use, presenting hazards to health; Chronic groin pain, left; Malignant neoplasm of upper lobe of right lung (HF[place]; Acute postoperative pain; Chronic hepatitis C without hepatic coma (HGills[place]; Cirrhosis of liver with ascites (HCarlyss; Renal lesion; and Iron deficiency anemia on their problem list. Her primarily concern today is the Medication Refill (Oxycodone 211m and Back Pain  Pain Assessment: Location: Lower Back Radiating:   Onset: More than a month ago Duration: Chronic pain Quality: Dull, Constant Severity: 7 /10 (subjective, self-reported pain score)  Note: Reported level is compatible with observation. Clinically the patient looks like a 3/10 A 3/10 is viewed as "Moderate" and described as significantly interfering with activities of daily living (ADL). It becomes difficult to feed, bathe, get dressed, get on and off the toilet or to perform personal hygiene functions. Difficult to get in and out of bed or a chair without assistance. Very distracting. With effort, it can be ignored when deeply involved in activities. Information on the proper use of the pain scale provided to the patient today. When using our objective Pain Scale, levels between 6 and 10/10 are said to belong in an emergency room, as it progressively worsens from a 6/10, described as severely limiting, requiring emergency care not usually available at an outpatient pain management facility. At a 6/10 level, communication becomes difficult and requires great effort. Assistance to reach the emergency department may be required. Facial flushing and profuse  sweating along with potentially dangerous increases in heart rate and blood pressure will be evident. Effect on ADL: "Very limiting and unable to walk long distances. Lately feel my  balance is off a lot more"  Timing: Constant Modifying factors: Oxycodone and recliner  BP: 107/71  HR: 78  Audrey Garrett was last scheduled for an appointment on 12/01/2017 for medication management. During today's appointment we reviewed Audrey Garrett's chronic pain status, as well as her outpatient medication regimen. She admits that she is having increased twisting type pain in her right inner thigh and groin. She feels like the more she does causes her more pain; ie vacuuming.  She admits that her right heel is completely numb. She feels like this affects her balance. She denies any falls but is just concern about her overall back stablity. She admits that she continues to "twist".  She is afraid that she may break.   The patient  reports that she does not use drugs. Her body mass index is 24.11 kg/m.  Further details on both, my assessment(s), as well as the proposed treatment plan, please see below.  Controlled Substance Pharmacotherapy Assessment REMS (Risk Evaluation and Mitigation Strategy)  Analgesic:Oxycodone IR 20 mg every 6 (80 mg/day) MME/day:120 mg/day.  Audrey Napoleon, RN  02/27/2018 11:42 AM  Sign at close encounter Nursing Pain Medication Assessment:  Safety precautions to be maintained throughout the outpatient stay will include: orient to surroundings, keep bed in low position, maintain call bell within reach at all times, provide assistance with transfer out of bed and ambulation.  Medication Inspection Compliance: Pill count conducted under aseptic conditions, in front of the patient. Neither the pills nor the bottle was removed from the patient's sight at any time. Once count was completed pills were immediately returned to the patient in their original bottle.  Medication: Oxycodone  HCL Pill/Patch Count: 68 of 120 pills remain Pill/Patch Appearance: Markings consistent with prescribed medication Bottle Appearance: Standard pharmacy container. Clearly labeled. Filled Date: 82 / 20 / 2019 Last Medication intake:  Today   Pharmacokinetics: Liberation and absorption (onset of action): WNL Distribution (time to peak effect): WNL Metabolism and excretion (duration of action): WNL         Pharmacodynamics: Desired effects: Analgesia: Ms. Siguenza reports >50% benefit. Functional ability: Patient reports that medication allows her to accomplish basic ADLs Clinically meaningful improvement in function (CMIF): Sustained CMIF goals met Perceived effectiveness: Described as relatively effective, allowing for increase in activities of daily living (ADL) Undesirable effects: Side-effects or Adverse reactions: None reported Monitoring: [place] PMP: Online review of the past 42-monthperiod conducted. Compliant with practice rules and regulations Last UDS on record: Summary  Date Value Ref Range Status  12/01/2017 FINAL  Final    Comment:    ==================================================================== TOXASSURE SELECT 13 (MW) ==================================================================== Test                             Result       Flag       Units Drug Present and Declared for Prescription Verification   Oxycodone                      1004         EXPECTED   ng/mg creat   Oxymorphone                    3423         EXPECTED   ng/mg creat   Noroxycodone                   6665  EXPECTED   ng/mg creat   Noroxymorphone                 3850         EXPECTED   ng/mg creat    Sources of oxycodone are scheduled prescription medications.    Oxymorphone, noroxycodone, and noroxymorphone are expected    metabolites of oxycodone. Oxymorphone is also available as a    scheduled prescription  medication. ==================================================================== Test                      Result    Flag   Units      Ref Range   Creatinine              26               mg/dL      >=20 ==================================================================== Declared Medications:  The flagging and interpretation on this report are based on the  following declared medications.  Unexpected results may arise from  inaccuracies in the declared medications.  **Note: The testing scope of this panel includes these medications:  Oxycodone  **Note: The testing scope of this panel does not include following  reported medications:  Cyclobenzaprine  Escitalopram (Lexapro)  Furosemide (Lasix)  Lactulose  Metformin  Multivitamin  Pantoprazole (Protonix)  Pregabalin (Lyrica)  Spironolactone (Aldactone)  Vitamin B1 ==================================================================== For clinical consultation, please call [phone]. ====================================================================    UDS interpretation: Compliant          Medication Assessment Form: Reviewed. Patient indicates being compliant with therapy Treatment compliance: Compliant Risk Assessment Profile: Aberrant behavior: See prior evaluations. None observed or detected today Comorbid factors increasing risk of overdose: See prior notes. No additional risks detected today Opioid risk tool (ORT) (Total Score): 1 Personal History of Substance Abuse (SUD-Substance use disorder):  Alcohol: Negative  Illegal Drugs: Negative  Rx Drugs: Negative  ORT Risk Level calculation: Low Risk Risk of substance use disorder (SUD): Moderate-to-High Opioid Risk Tool - 02/27/18 1048      Family History of Substance Abuse   Alcohol  Negative    Illegal Drugs  Negative    Rx Drugs  Negative      Personal History of Substance Abuse   Alcohol  Negative    Illegal Drugs  Negative    Rx Drugs  Negative      Age    Age between 38-45 years   No      History of Preadolescent Sexual Abuse   History of Preadolescent Sexual Abuse  Negative or Female      Psychological Disease   Psychological Disease  Negative    Depression  Positive      Total Score   Opioid Risk Tool Scoring  1    Opioid Risk Interpretation  Low Risk      ORT Scoring interpretation table:  Score <3 = Low Risk for SUD  Score between 4-7 = Moderate Risk for SUD  Score >8 = High Risk for Opioid Abuse   Risk Mitigation Strategies:  Patient Counseling: Covered Patient-Prescriber Agreement (PPA): Present and active  Notification to other healthcare providers: Done  Pharmacologic Plan: No change in therapy, at this time.             Laboratory Chemistry  Inflammation Markers (CRP: Acute Phase) (ESR: Chronic Phase) Lab Results  Component Value Date   CRP <0.5 04/03/2015   ESRSEDRATE 27 04/03/2015   LATICACIDVEN 2.7 (HH) 04/23/2017  Rheumatology Markers No results found for: RF, ANA, LABURIC, URICUR, LYMEIGGIGMAB, LYMEABIGMQN, HLAB27                      Renal Function Markers Lab Results  Component Value Date   BUN 6 02/02/2018   CREATININE 0.57 02/02/2018   GFRAA >60 02/02/2018   GFRNONAA >60 02/02/2018                             Hepatic Function Markers Lab Results  Component Value Date   AST 64 (H) 02/02/2018   ALT 34 02/02/2018   ALBUMIN 3.7 02/02/2018   ALKPHOS 67 02/02/2018   LIPASE 32 11/06/2015   AMMONIA 12 09/14/2016                        Electrolytes Lab Results  Component Value Date   NA 136 02/02/2018   K 4.9 02/02/2018   CL 103 02/02/2018   CALCIUM 9.1 02/02/2018   MG 1.9 01/22/2016   PHOS 3.6 11/23/2013                        Neuropathy Markers Lab Results  Component Value Date   VITAMINB12 676 10/04/2017   FOLATE 7.8 10/04/2017   HGBA1C 5.2 12/05/2017                        CNS Tests No results found for: COLORCSF, APPEARCSF, RBCCOUNTCSF, WBCCSF,  POLYSCSF, LYMPHSCSF, EOSCSF, PROTEINCSF, GLUCCSF, JCVIRUS, CSFOLI, IGGCSF                      Bone Pathology Markers No results found for: VD25OH, VD125OH2TOT, [id number], [id number], 25OHVITD1, 25OHVITD2, 25OHVITD3, TESTOFREE, TESTOSTERONE                       Coagulation Parameters Lab Results  Component Value Date   INR 1.15 12/14/2017   LABPROT 14.6 12/14/2017   APTT 35 08/24/2017   PLT 144 (L) 02/02/2018                        Cardiovascular Markers Lab Results  Component Value Date   BNP 929 (H) 09/23/2013   CKTOTAL 1,597 (H) 12/17/2012   TROPONINI <0.03 05/07/2015   HGB 12.0 02/02/2018   HCT 36.3 02/02/2018                         CA Markers No results found for: CEA, CA125, LABCA2                      Note: Lab results reviewed.  Recent Diagnostic Imaging Results  CT ABDOMEN W WO CONTRAST CLINICAL DATA:  Malignant neoplasm of the right upper lobe.  EXAM: CT CHEST AND ABDOMEN WITH CONTRAST  TECHNIQUE: Multidetector CT imaging of the chest and abdomen was performed following the standard protocol during bolus administration of intravenous contrast.  CONTRAST:  168m ISOVUE-370 IOPAMIDOL (ISOVUE-370) INJECTION 76%  COMPARISON:  09/30/2017  FINDINGS: CT CHEST FINDINGS  Cardiovascular: The heart size appears within normal limits. There is no pericardial effusion identified. Aortic atherosclerosis.  Mediastinum/Nodes: Normal appearance of the thyroid gland. The trachea appears patent and is midline. Normal appearance of the esophagus. No enlarged mediastinal or hilar lymph nodes.  Lungs/Pleura: No pleural  effusion identified. Moderate change of emphysema. Multiple sub solid lesions are again noted scattered throughout both lungs.  -the index lesion within the lateral right upper lobe measures 2 by 1.4 cm, image 39/3. Unchanged.  -the index 2.2 cm left lower lobe ground-glass nodule has resolved in the interval compatible with an inflammatory/infectious  process.  - within the posteromedial left lower lobe there is a 1.1 cm ground-glass nodule, image 94/3. Unchanged.  -Within the anterior left upper lobe there is a part solid nodule measuring 9 mm, image 31/3. Unchanged from previous exam.  Post treatment changes with perihilar masslike architectural distortion, fibrosis and ground-glass attenuation noted the in the right midlung. The underlying index nodular density measures 8 mm on today's study, image 77/3. Previously 9 mm.  Musculoskeletal: Previous posterior hardware fixation of the thoracic spine. No acute or suspicious bone lesions identified.  CT ABDOMEN FINDINGS  Hepatobiliary: There is diffuse hepatic steatosis. The contour of the liver is slightly irregular and there is hypertrophy of the lateral segment of left lobe. Imaging findings are worrisome for cirrhosis. No enhancing liver lesions identified. Stones identified within the dependent portion of the gallbladder measuring up to 4 mm. No biliary ductal dilatation identified.  Pancreas: Normal appearance of the pancreas.  Spleen: Spleen unremarkable.  Adrenals/Urinary Tract: Normal adrenal glands.  Microwave ablation defect within the upper pole of right kidney identified measuring 3.4 by 2.2 cm, image 34/6. No suspicious enhancement within the ablation zone to suggest residual tumor. No perinephric fluid collections or hematoma identified. No hydronephrosis. Several small hypodensities within the left kidney are identified and are too small to characterize.  Stomach/Bowel: Stomach is within normal limits. No evidence of bowel wall thickening, distention, or inflammatory changes.  Vascular/Lymphatic: Aortic atherosclerosis. No aneurysm. No upper abdominal adenopathy identified.  Other: No free fluid or fluid collections.  Musculoskeletal: Posterior hardware fixation of the lumbar spine and sacrum noted. No acute or suspicious bone lesions  identified.  IMPRESSION: 1. Status post microwave ablation of right upper pole renal no complicating features identified. No findings identified to suggest residual tumor. 2. Again seen are multiple sub solid lesions within both lungs. The appearance is unchanged when compared with the previous exam. 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 Stable post treatment changes within the right midlung compatible with external beam radiation. Underlying nodular density is stable when compared with previous exam. 4. Morphologic features of liver concerning for cirrhosis. 5. Gallstones. 6. Aortic Atherosclerosis (ICD10-I70.0) and Emphysema (ICD10-J43.9).  Electronically Signed   By: Kerby Moors M.D.   On: 01/31/2018 11:28 CT Chest W Contrast CLINICAL DATA:  Malignant neoplasm of the right upper lobe.  EXAM: CT CHEST AND ABDOMEN WITH CONTRAST  TECHNIQUE: Multidetector CT imaging of the chest and abdomen was performed following the standard protocol during bolus administration of intravenous contrast.  CONTRAST:  139m ISOVUE-370 IOPAMIDOL (ISOVUE-370) INJECTION 76%  COMPARISON:  09/30/2017  FINDINGS: CT CHEST FINDINGS  Cardiovascular: The heart size appears within normal limits. There is no pericardial effusion identified. Aortic atherosclerosis.  Mediastinum/Nodes: Normal appearance of the thyroid gland. The trachea appears patent and is midline. Normal appearance of the esophagus. No enlarged mediastinal or hilar lymph nodes.  Lungs/Pleura: No pleural effusion identified. Moderate change of emphysema. Multiple sub solid lesions are again noted scattered throughout both lungs.  -the index lesion within the lateral right upper lobe measures 2 by 1.4 cm, image 39/3. Unchanged.  -the index 2.2 cm left lower lobe ground-glass nodule has  resolved in the interval compatible with an inflammatory/infectious process.  - within the  posteromedial left lower lobe there is a 1.1 cm ground-glass nodule, image 94/3. Unchanged.  -Within the anterior left upper lobe there is a part solid nodule measuring 9 mm, image 31/3. Unchanged from previous exam.  Post treatment changes with perihilar masslike architectural distortion, fibrosis and ground-glass attenuation noted the in the right midlung. The underlying index nodular density measures 8 mm on today's study, image 77/3. Previously 9 mm.  Musculoskeletal: Previous posterior hardware fixation of the thoracic spine. No acute or suspicious bone lesions identified.  CT ABDOMEN FINDINGS  Hepatobiliary: There is diffuse hepatic steatosis. The contour of the liver is slightly irregular and there is hypertrophy of the lateral segment of left lobe. Imaging findings are worrisome for cirrhosis. No enhancing liver lesions identified. Stones identified within the dependent portion of the gallbladder measuring up to 4 mm. No biliary ductal dilatation identified.  Pancreas: Normal appearance of the pancreas.  Spleen: Spleen unremarkable.  Adrenals/Urinary Tract: Normal adrenal glands.  Microwave ablation defect within the upper pole of right kidney identified measuring 3.4 by 2.2 cm, image 34/6. No suspicious enhancement within the ablation zone to suggest residual tumor. No perinephric fluid collections or hematoma identified. No hydronephrosis. Several small hypodensities within the left kidney are identified and are too small to characterize.  Stomach/Bowel: Stomach is within normal limits. No evidence of bowel wall thickening, distention, or inflammatory changes.  Vascular/Lymphatic: Aortic atherosclerosis. No aneurysm. No upper abdominal adenopathy identified.  Other: No free fluid or fluid collections.  Musculoskeletal: Posterior hardware fixation of the lumbar spine and sacrum noted. No acute or suspicious bone lesions identified.  IMPRESSION: 1. Status post  microwave ablation of right upper pole renal no complicating features identified. No findings identified to suggest residual tumor. 2. Again seen are multiple sub solid lesions within both lungs. The appearance is unchanged when compared with the previous exam. 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 Stable post treatment changes within the right midlung compatible with external beam radiation. Underlying nodular density is stable when compared with previous exam. 4. Morphologic features of liver concerning for cirrhosis. 5. Gallstones. 6. Aortic Atherosclerosis (ICD10-I70.0) and Emphysema (ICD10-J43.9).  Electronically Signed   By: Kerby Moors M.D.   On: 01/31/2018 11:28  Complexity Note: Imaging results reviewed. Results shared with Ms. Mcniel, using [employer].                         Meds   Current Outpatient Medications:  .  [START ON 03/17/2018] cyclobenzaprine (FLEXERIL) 5 MG tablet, Take 1 tablet (5 mg total) by mouth 3 (three) times daily as needed for muscle spasms., Disp: 90 tablet, Rfl: 2 .  escitalopram (LEXAPRO) 10 MG tablet, Take 10 mg by mouth daily. , Disp: , Rfl:  .  furosemide (LASIX) 40 MG tablet, Take 40 mg by mouth 2 (two) times daily. , Disp: , Rfl:  .  lactulose (CHRONULAC) 10 GM/15ML solution, Take 10 g by mouth 3 (three) times daily as needed for mild constipation or moderate constipation. , Disp: , Rfl:  .  linagliptin (TRADJENTA) 5 MG TABS tablet, Take 5 mg by mouth daily., Disp: , Rfl:  .  metFORMIN (GLUCOPHAGE) 500 MG tablet, Take 500 mg by mouth 2 (two) times daily. , Disp: , Rfl:  .  Multiple Vitamin (MULTIVITAMIN WITH MINERALS) TABS tablet, Take 1 tablet by mouth daily., Disp: , Rfl:  .  [  START ON 05/16/2018] Oxycodone HCl 20 MG TABS, Take 1 tablet (20 mg total) by mouth every 6 (six) hours., Disp: 120 tablet, Rfl: 0 .  OZEMPIC 1 MG/DOSE SOPN, Inject 1 mg into the skin every Tuesday., Disp: , Rfl:  .   pantoprazole (PROTONIX) 40 MG tablet, Take 40 mg by mouth 2 (two) times daily. , Disp: , Rfl: 11 .  pregabalin (LYRICA) 200 MG capsule, Take 200 mg by mouth 2 (two) times daily., Disp: , Rfl:  .  spironolactone (ALDACTONE) 50 MG tablet, Take 100 mg by mouth daily. , Disp: , Rfl:  .  [START ON 04/16/2018] Oxycodone HCl 20 MG TABS, Take 1 tablet (20 mg total) by mouth every 6 (six) hours as needed., Disp: 120 tablet, Rfl: 0 .  [START ON 03/17/2018] Oxycodone HCl 20 MG TABS, Take 1 tablet (20 mg total) by mouth every 6 (six) hours as needed., Disp: 120 tablet, Rfl: 0  ROS  Constitutional: Denies any fever or chills Gastrointestinal: No reported hemesis, hematochezia, vomiting, or acute GI distress Musculoskeletal: Denies any acute onset joint swelling, redness, loss of ROM, or weakness Neurological: No reported episodes of acute onset apraxia, aphasia, dysarthria, agnosia, amnesia, paralysis, loss of coordination, or loss of consciousness  Allergies  Ms. Malson has No Known Allergies.  PFSH  Drug: Ms. Bonus  reports that she does not use drugs. Alcohol:  reports that she drinks about 12.0 standard drinks of alcohol per week. Tobacco:  reports that she has been smoking cigarettes. She has a 22.50 pack-year smoking history. She has never used smokeless tobacco. Medical:  has a past medical history of Alcohol abuse, Alcoholic cirrhosis ([place]), Anxiety, Chronic hepatitis C ([place]), Chronic LBP (12/25/2014), Chronic pain syndrome, DDD (degenerative disc disease), lumbar, Diverticulosis, Emphysema lung ([place]), Fibromyalgia, Full dentures, GERD (gastroesophageal reflux disease), Hiatal hernia, History of GI bleed, Hypertension, Iron deficiency anemia due to chronic blood loss, Lumbar facet joint syndrome, Lumbar spondylosis, Lung cancer ([place]) (11/25/2016), MDD (major depressive disorder), Nephrolithiasis, OA (osteoarthritis of spine), Polyarthritis, Polyneuropathy, Restless leg syndrome, Right renal mass, RLS  (restless legs syndrome), SOB (shortness of breath), Type 2 diabetes mellitus ([place]), Urinary frequency, and Vitamin D deficiency disease. Surgical: Ms. Celia  has a past surgical history that includes Esophagogastroduodenoscopy (N/A, 04/14/2015); Esophagogastroduodenoscopy (egd) with propofol (N/A, 09/16/2015); Colonoscopy with propofol (N/A, 09/25/2015); Umbilical hernia repair (N/A, 11/06/2015); Esophagogastroduodenoscopy (egd) with propofol (N/A, 01/22/2016); Colonoscopy with propofol (N/A, 04/01/2016); Esophagogastroduodenoscopy (egd) with propofol (N/A, 04/01/2016); Electormagnetic navigation bronchoscopy (N/A, 11/25/2016); IR Radiologist Eval & Mgmt (04/13/2017); IR Radiologist Eval & Mgmt (11/15/2017); Spinal fusion (12/19/2011); Endometrial ablation (2007); Tubal ligation (Bilateral, yrs ago); Tonsillectomy (age 32); and Radiology with anesthesia (Right, 12/14/2017). Family: family history includes [name] in her father; Breast cancer (age of onset: 2) in her sister; Heart disease in her mother; Stroke in her mother.  Constitutional Exam  General appearance: Well nourished, well developed, and well hydrated. In no apparent acute distress Vitals:   02/27/18 1035  BP: 107/71  Pulse: 78  Resp: 18  Temp: 98.8 F (37.1 C)  SpO2: 100%  Weight: 168 lb (76.2 kg)  Height: 5' 10" (1.778 m)   Psych/Mental status: Alert, oriented x 3 (person, place, & time)       Eyes: PERLA Respiratory: No evidence of acute respiratory distress  Cervical Spine Area Exam  Skin & Axial Inspection: No masses, redness, edema, swelling, or associated skin lesions Alignment: Symmetrical Functional ROM: Unrestricted ROM      Stability: No instability detected Muscle  Tone/Strength: Functionally intact. No obvious neuro-muscular anomalies detected. Sensory (Neurological): Unimpaired Palpation: No palpable anomalies              Thoracic Spine Area Exam  Skin & Axial Inspection: Well healed scar from previous spine surgery  detected Alignment: Asymmetric Functional ROM: Decreased ROM Stability: No instability detected Muscle Tone/Strength: Increased muscle tone over affected area Sensory (Neurological): Unimpaired Muscle strength & Tone: No palpable anomalies  Lumbar Spine Area Exam  Skin & Axial Inspection: Well healed scar from previous spine surgery detected Alignment: Asymmetric Functional ROM: Decreased ROM       Stability: No instability detected Muscle Tone/Strength: Increased muscle tone over affected area Sensory (Neurological): Dermatomal pain pattern Palpation: No palpable anomalies        Gait & Posture Assessment  Ambulation: Unassisted Gait: Uneven Posture: Difficulty standing up straight, notable shift in her overall alignment  Lower Extremity Exam    Side: Right lower extremity  Side: Left lower extremity  Stability: No instability observed          Stability: No instability observed          Skin & Extremity Inspection: Edema  Skin & Extremity Inspection: Edema  Functional ROM: Unrestricted ROM                  Functional ROM: Unrestricted ROM                  Muscle Tone/Strength: Functionally intact. No obvious neuro-muscular anomalies detected.  Muscle Tone/Strength: Functionally intact. No obvious neuro-muscular anomalies detected.  Sensory (Neurological): Dermatomal pain pattern        Sensory (Neurological): Unimpaired            Palpation: No palpable anomalies  Palpation: No palpable anomalies   Assessment  Primary Diagnosis & Pertinent Problem List: The primary encounter diagnosis was Lumbar spondylosis. Diagnoses of Cervical foraminal stenosis (Left C5-6; Bilateral C6-7), Chronic upper back pain, and Chronic pain syndrome were also pertinent to this visit.  Status Diagnosis  Controlled Controlled Controlled 1. Lumbar spondylosis   2. Cervical foraminal stenosis (Left C5-6; Bilateral C6-7)   3. Chronic upper back pain   4. Chronic pain syndrome     Problems updated  and reviewed during this visit: No problems updated. Plan of Care  Pharmacotherapy (Medications Ordered): Meds ordered this encounter  Medications  . cyclobenzaprine (FLEXERIL) 5 MG tablet    Sig: Take 1 tablet (5 mg total) by mouth 3 (three) times daily as needed for muscle spasms.    Dispense:  90 tablet    Refill:  2    Do not place this medication, or any other prescription from our practice, on "Automatic Refill". Patient may have prescription filled one day early if pharmacy is closed on scheduled refill date.    Order Specific Question:   Supervising Provider    Answer:   [name] [phone]  . Oxycodone HCl 20 MG TABS    Sig: Take 1 tablet (20 mg total) by mouth every 6 (six) hours.    Dispense:  120 tablet    Refill:  0    Do not place this medication, or any other prescription from our practice, on "Automatic Refill". Patient may have prescription filled one day early if pharmacy is closed on scheduled refill date.    Order Specific Question:   Supervising Provider    Answer:   [name] [phone]  . Oxycodone HCl 20 MG TABS    Sig: Take  1 tablet (20 mg total) by mouth every 6 (six) hours as needed.    Dispense:  120 tablet    Refill:  0    Do not place this medication, or any other prescription from our practice, on "Automatic Refill". Patient may have prescription filled one day early if pharmacy is closed on scheduled refill date.    Order Specific Question:   Supervising Provider    Answer:   [name] [phone]  . Oxycodone HCl 20 MG TABS    Sig: Take 1 tablet (20 mg total) by mouth every 6 (six) hours as needed.    Dispense:  120 tablet    Refill:  0    Do not place this medication, or any other prescription from our practice, on "Automatic Refill". Patient may have prescription filled one day early if pharmacy is closed on scheduled refill date.    Order Specific Question:   Supervising Provider    Answer:   [name] [[id number]]   New  Prescriptions   No medications on file   Medications administered today: Derica B. Westerhoff had no medications administered during this visit. Lab-work, procedure(s), and/or referral(s): No orders of the defined types were placed in this encounter.  Imaging and/or referral(s): None  Interventional therapies: Planned, scheduled, and/or pending: Not at this time   Considering:  Diagnostic bilateral lumbar facetblock  Possible bilateral lumbar facet RFA Diagnostic left caudal Epiduralsteroid injection + diagnostic epidurogram Possible Racz procedure Diagnostic bilateral sacroiliacjoint injection  Possible bilateral sacroiliac joint RFA Diagnostic left cervical epiduralsteroid injection  Diagnostic bilateral cervical facetblock  Possible bilateral cervical facet RFA   Palliative PRN treatment(s):  Diagnostic bilateral lumbar facet blockunder fluoroscopic guidance and IV sedation  Diagnostic bilateral intra-articular hipjoint injection under fluoroscopic guidance and IV sedation  Diagnostic bilateral sacroiliacjoint block under fluoroscopic guidance and IV sedation     Provider-requested follow-up: Return in about 3 months (around 05/29/2018) for MedMgmt.  Future Appointments  Date Time Provider [place]  05/05/2018 11:00 AM [facility] None  06/05/2018 10:45 AM Audrey Francois, NP [facility] None  08/02/2018 11:00 AM [facility] None  08/03/2018  9:00 AM [facility]  08/03/2018  2:00 PM Sindy Guadeloupe, MD [facility] None   Primary Care Physician: Audrey Marble, MD [facility] Note by: Audrey Francois NP Date: 02/27/2018; Time: 1:06 PM  Pain Score Disclaimer: We use the NRS-11 scale. This is a self-reported, subjective measurement of pain severity with only modest accuracy. It is used primarily to identify changes within a particular patient. It must be understood that outpatient  pain scales are significantly less accurate that those used for research, where they can be applied under ideal controlled circumstances with minimal exposure to variables. In reality, the score is likely to be a combination of pain intensity and pain affect, where pain affect describes the degree of emotional arousal or changes in action readiness caused by the sensory experience of pain. Factors such as social and work situation, setting, emotional state, anxiety levels, expectation, and prior pain experience may influence pain perception and show large inter-individual differences that may also be affected by time variables.  Patient instructions provided during this appointment: Patient Instructions  ____________________________________________________________________________________________  Medication Rules  Purpose: To inform patients, and their family members, of our rules and regulations.  Applies to: All patients receiving prescriptions (written or electronic).  Pharmacy of record: Pharmacy where electronic prescriptions will be sent. If written prescriptions  are taken to a different pharmacy, please inform the nursing staff. The pharmacy listed in the electronic medical record should be the one where you would like electronic prescriptions to be sent.  Electronic prescriptions: In compliance with the [place] (STOP) Act of 2017 (Session Lanny Cramp [phone]), effective March 29, 2018, all controlled substances must be electronically prescribed. Calling prescriptions to the pharmacy will cease to exist.  Prescription refills: Only during scheduled appointments. Applies to all prescriptions.  NOTE: The following applies primarily to controlled substances (Opioid* Pain Medications).   [name] responsibilities: 1. Pain Pills: Bring all pain pills to every appointment (except for procedure appointments). 2. Pill Bottles: Bring pills in original  pharmacy bottle. Always bring the newest bottle. Bring bottle, even if empty. 3. Medication refills: You are responsible for knowing and keeping track of what medications you take and those you need refilled. The day before your appointment: write a list of all prescriptions that need to be refilled. The day of the appointment: give the list to the admitting nurse. Prescriptions will be written only during appointments. If you forget a medication: it will not be "Called in", "Faxed", or "electronically sent". You will need to get another appointment to get these prescribed. No early refills. Do not call asking to have your prescription filled early. 4. Prescription Accuracy: You are responsible for carefully inspecting your prescriptions before leaving our office. Have the discharge nurse carefully go over each prescription with you, before taking them home. Make sure that your name is accurately spelled, that your address is correct. Check the name and dose of your medication to make sure it is accurate. Check the number of pills, and the written instructions to make sure they are clear and accurate. Make sure that you are given enough medication to last until your next medication refill appointment. 5. Taking Medication: Take medication as prescribed. When it comes to controlled substances, taking less pills or less frequently than prescribed is permitted and encouraged. Never take more pills than instructed. Never take medication more frequently than prescribed.  6. Inform other Doctors: Always inform, all of your healthcare providers, of all the medications you take. 7. Pain Medication from other Providers: You are not allowed to accept any additional pain medication from any other Doctor or Healthcare provider. There are two exceptions to this rule. (see below) In the event that you require additional pain medication, you are responsible for notifying us, as stated below. 8. Medication Agreement:  You are responsible for carefully reading and following our Medication Agreement. This must be signed before receiving any prescriptions from our practice. Safely store a copy of your signed Agreement. Violations to the Agreement will result in no further prescriptions. (Additional copies of our Medication Agreement are available upon request.) 9. Laws, Rules, & Regulations: All patients are expected to follow all Federal and [employer], Rules, [employer]. Ignorance of the Laws does not constitute a valid excuse. The use of any illegal substances is prohibited. 10. Adopted CDC guidelines & recommendations: Target dosing levels will be at or below 60 MME/day. Use of benzodiazepines** is not recommended.  Exceptions: There are only two exceptions to the rule of not receiving pain medications from other Healthcare Providers. 1. Exception #1 (Emergencies): In the event of an emergency (i.e.: accident requiring emergency care), you are allowed to receive additional pain medication. However, you are responsible for: As soon as you are able, call our office [phone], at any time of  the day or night, and leave a message stating your name, the date and nature of the emergency, and the name and dose of the medication prescribed. In the event that your call is answered by a member of our staff, make sure to document and save the date, time, and the name of the person that took your information.  2. Exception #2 (Planned Surgery): In the event that you are scheduled by another doctor or dentist to have any type of surgery or procedure, you are allowed (for a period no longer than 30 days), to receive additional pain medication, for the acute post-op pain. However, in this case, you are responsible for picking up a copy of our "Post-op Pain Management for Surgeons" handout, and giving it to your surgeon or dentist. This document is available at our office, and does not require an appointment to obtain  it. Simply go to our office during business hours (Monday-Thursday from 8:00 AM to 4:00 PM) (Friday 8:00 AM to 12:00 Noon) or if you have a scheduled appointment with [place], prior to your surgery, and ask for it by name. In addition, you will need to provide [place] with your name, name of your surgeon, type of surgery, and date of procedure or surgery.  *Opioid medications include: morphine, codeine, oxycodone, oxymorphone, hydrocodone, hydromorphone, meperidine, tramadol, tapentadol, buprenorphine, fentanyl, methadone. **Benzodiazepine medications include: diazepam (Valium), alprazolam (Xanax), clonazepam (Klonopine), lorazepam (Ativan), clorazepate (Tranxene), chlordiazepoxide (Librium), estazolam (Prosom), oxazepam (Serax), temazepam (Restoril), triazolam (Halcion) (Last updated: 05/26/2017) ____________________________________________________________________________________________

## 2018-03-28 NOTE — Discharge Instructions (Signed)
[address] [place]   1. Take your medication as prescribed.  Pain medication should be taken only as needed.  2. Keep the dressing clean, dry and intact.  3. Keep your foot elevated above the heart level for the first 48 hours.  4. Walking to the bathroom and brief periods of walking are acceptable, unless we have instructed you to be non-weight bearing.  5. Always wear your post-op shoe when walking.  Always use your crutches if you are to be non-weight bearing.  6. Do not take a shower. Baths are permissible as long as the foot is kept out of the water.   7. Every hour you are awake:  - Bend your knee 15 times. - Flex foot 15 times - Massage calf 15 times  8. Call [facility] [phone]) if any of the following problems occur: - You develop a temperature or fever. - The bandage becomes saturated with blood. - Medication does not stop your pain. - Injury of the foot occurs. - Any symptoms of infection including redness, odor, or red streaks running from wound.   General Anesthesia, Adult, Care After This sheet gives you information about how to care for yourself after your procedure. Your health care provider may also give you more specific instructions. If you have problems or questions, contact your health care provider. What can I expect after the procedure? After the procedure, the following side effects are common:  Pain or discomfort at the IV site.  Nausea.  Vomiting.  Sore throat.  Trouble concentrating.  Feeling cold or chills.  Weak or tired.  Sleepiness and fatigue.  Soreness and body aches. These side effects can affect parts of the body that were not involved in surgery. Follow these instructions at home:  For at least 24 hours after the procedure:  Have a responsible adult stay with you. It is important to  have someone help care for you until you are awake and alert.  Rest as needed.  Do not: ? Participate in activities in which you could fall or become injured. ? Drive. ? Use heavy machinery. ? Drink alcohol. ? Take sleeping pills or medicines that cause drowsiness. ? Make important decisions or sign legal documents. ? Take care of children on your own. Eating and drinking  Follow any instructions from your health care provider about eating or drinking restrictions.  When you feel hungry, start by eating small amounts of foods that are soft and easy to digest (bland), such as toast. Gradually return to your regular diet.  Drink enough fluid to keep your urine pale yellow.  If you vomit, rehydrate by drinking water, juice, or clear broth. General instructions  If you have sleep apnea, surgery and certain medicines can increase your risk for breathing problems. Follow instructions from your health care provider about wearing your sleep device: ? Anytime you are sleeping, including during daytime naps. ? While taking prescription pain medicines, sleeping medicines, or medicines that make you drowsy.  Return to your normal activities as told by your health care provider. Ask your health care provider what activities are safe for you.  Take over-the-counter and prescription medicines only as told by your health care provider.  If you smoke, do not smoke without supervision.  Keep all follow-up visits as told by your health care provider. This is important. Contact a health care provider if:  You have  nausea or vomiting that does not get better with medicine.  You cannot eat or drink without vomiting.  You have pain that does not get better with medicine.  You are unable to pass urine.  You develop a skin rash.  You have a fever.  You have redness around your IV site that gets worse. Get help right away if:  You have difficulty breathing.  You have chest pain.  You  have blood in your urine or stool, or you vomit blood. Summary  After the procedure, it is common to have a sore throat or nausea. It is also common to feel tired.  Have a responsible adult stay with you for the first 24 hours after general anesthesia. It is important to have someone help care for you until you are awake and alert.  When you feel hungry, start by eating small amounts of foods that are soft and easy to digest (bland), such as toast. Gradually return to your regular diet.  Drink enough fluid to keep your urine pale yellow.  Return to your normal activities as told by your health care provider. Ask your health care provider what activities are safe for you. This information is not intended to replace advice given to you by your health care provider. Make sure you discuss any questions you have with your health care provider. Document Released: 06/21/2000 Document Revised: 10/29/2016 Document Reviewed: 10/29/2016 Elsevier Interactive Patient Education  2019 [employer].

## 2018-03-30 ENCOUNTER — Other Ambulatory Visit: Payer: Self-pay | Admitting: Podiatry

## 2018-04-03 ENCOUNTER — Encounter: Payer: Self-pay | Admitting: *Deleted

## 2018-04-03 ENCOUNTER — Other Ambulatory Visit: Payer: Self-pay

## 2018-04-06 ENCOUNTER — Ambulatory Visit
Admission: RE | Admit: 2018-04-06 | Discharge: 2018-04-06 | Disposition: A | Discharge status: Home / Self Care | Payer: Medicare Other | Attending: Podiatry | Admitting: Podiatry

## 2018-04-06 ENCOUNTER — Ambulatory Visit: Payer: Medicare Other | Admitting: Anesthesiology

## 2018-04-06 ENCOUNTER — Encounter
Admission: RE | Disposition: A | Discharge status: Home / Self Care | Payer: Self-pay | Source: Home / Self Care | Attending: Podiatry

## 2018-04-06 DIAGNOSIS — Z8249 Family history of ischemic heart disease and other diseases of the circulatory system: Secondary | ICD-10-CM | POA: Diagnosis not present

## 2018-04-06 DIAGNOSIS — F17210 Nicotine dependence, cigarettes, uncomplicated: Secondary | ICD-10-CM | POA: Diagnosis not present

## 2018-04-06 DIAGNOSIS — N200 Calculus of kidney: Secondary | ICD-10-CM | POA: Insufficient documentation

## 2018-04-06 DIAGNOSIS — K429 Umbilical hernia without obstruction or gangrene: Secondary | ICD-10-CM | POA: Insufficient documentation

## 2018-04-06 DIAGNOSIS — Z803 Family history of malignant neoplasm of breast: Secondary | ICD-10-CM | POA: Diagnosis not present

## 2018-04-06 DIAGNOSIS — K7031 Alcoholic cirrhosis of liver with ascites: Secondary | ICD-10-CM | POA: Diagnosis not present

## 2018-04-06 DIAGNOSIS — E1142 Type 2 diabetes mellitus with diabetic polyneuropathy: Secondary | ICD-10-CM | POA: Diagnosis not present

## 2018-04-06 DIAGNOSIS — M2041 Other hammer toe(s) (acquired), right foot: Secondary | ICD-10-CM | POA: Insufficient documentation

## 2018-04-06 DIAGNOSIS — Z79899 Other long term (current) drug therapy: Secondary | ICD-10-CM | POA: Diagnosis not present

## 2018-04-06 DIAGNOSIS — Z7984 Long term (current) use of oral hypoglycemic drugs: Secondary | ICD-10-CM | POA: Diagnosis not present

## 2018-04-06 DIAGNOSIS — G2581 Restless legs syndrome: Secondary | ICD-10-CM | POA: Diagnosis not present

## 2018-04-06 DIAGNOSIS — Z823 Family history of stroke: Secondary | ICD-10-CM | POA: Insufficient documentation

## 2018-04-06 DIAGNOSIS — M797 Fibromyalgia: Secondary | ICD-10-CM | POA: Insufficient documentation

## 2018-04-06 DIAGNOSIS — F419 Anxiety disorder, unspecified: Secondary | ICD-10-CM | POA: Insufficient documentation

## 2018-04-06 DIAGNOSIS — D649 Anemia, unspecified: Secondary | ICD-10-CM | POA: Insufficient documentation

## 2018-04-06 DIAGNOSIS — K219 Gastro-esophageal reflux disease without esophagitis: Secondary | ICD-10-CM | POA: Insufficient documentation

## 2018-04-06 DIAGNOSIS — K449 Diaphragmatic hernia without obstruction or gangrene: Secondary | ICD-10-CM | POA: Diagnosis not present

## 2018-04-06 DIAGNOSIS — K760 Fatty (change of) liver, not elsewhere classified: Secondary | ICD-10-CM | POA: Diagnosis not present

## 2018-04-06 DIAGNOSIS — F329 Major depressive disorder, single episode, unspecified: Secondary | ICD-10-CM | POA: Insufficient documentation

## 2018-04-06 DIAGNOSIS — M419 Scoliosis, unspecified: Secondary | ICD-10-CM | POA: Diagnosis not present

## 2018-04-06 DIAGNOSIS — M2031 Hallux varus (acquired), right foot: Secondary | ICD-10-CM | POA: Insufficient documentation

## 2018-04-06 DIAGNOSIS — I10 Essential (primary) hypertension: Secondary | ICD-10-CM | POA: Insufficient documentation

## 2018-04-06 DIAGNOSIS — M1990 Unspecified osteoarthritis, unspecified site: Secondary | ICD-10-CM | POA: Diagnosis not present

## 2018-04-06 DIAGNOSIS — M545 Low back pain: Secondary | ICD-10-CM | POA: Diagnosis not present

## 2018-04-06 DIAGNOSIS — J449 Chronic obstructive pulmonary disease, unspecified: Secondary | ICD-10-CM | POA: Diagnosis not present

## 2018-04-06 DIAGNOSIS — B1920 Unspecified viral hepatitis C without hepatic coma: Secondary | ICD-10-CM | POA: Insufficient documentation

## 2018-04-06 HISTORY — PX: FOOT ARTHRODESIS: SHX1655

## 2018-04-06 LAB — GLUCOSE, CAPILLARY: Glucose-Capillary: 136 mg/dL — ABNORMAL HIGH (ref 70–99)

## 2018-04-06 SURGERY — FUSION, JOINT, FOOT
Anesthesia: General | Site: Foot | Laterality: Right

## 2018-04-06 MED ORDER — PROPOFOL 10 MG/ML IV BOLUS
INTRAVENOUS | Status: DC | PRN
  Administered 2018-04-06: 50 mg via INTRAVENOUS
  Administered 2018-04-06: 150 mg via INTRAVENOUS

## 2018-04-06 MED ORDER — OXYCODONE HCL 5 MG/5ML PO SOLN: 5.0000 mg | Freq: Once | ORAL | Status: DC | PRN

## 2018-04-06 MED ORDER — HYDROCODONE-ACETAMINOPHEN 5-325 MG PO TABS: 1.0000 | ORAL_TABLET | Freq: Four times a day (QID) | ORAL | 0 refills | Status: DC | PRN

## 2018-04-06 MED ORDER — PROMETHAZINE HCL 25 MG/ML IJ SOLN: 6.2500 mg | INTRAMUSCULAR | Status: DC | PRN

## 2018-04-06 MED ORDER — DEXAMETHASONE SODIUM PHOSPHATE 4 MG/ML IJ SOLN
INTRAMUSCULAR | Status: DC | PRN
  Administered 2018-04-06: 4 mg via INTRAVENOUS

## 2018-04-06 MED ORDER — FENTANYL CITRATE (PF) 100 MCG/2ML IJ SOLN
INTRAMUSCULAR | Status: DC | PRN
  Administered 2018-04-06 (×2): 25 ug via INTRAVENOUS
  Administered 2018-04-06: 50 ug via INTRAVENOUS

## 2018-04-06 MED ORDER — ACETAMINOPHEN 325 MG PO TABS: 325.0000 mg | ORAL_TABLET | ORAL | Status: DC | PRN

## 2018-04-06 MED ORDER — ACETAMINOPHEN 160 MG/5ML PO SOLN: 325.0000 mg | ORAL | Status: DC | PRN

## 2018-04-06 MED ORDER — ONDANSETRON HCL 4 MG/2ML IJ SOLN
INTRAMUSCULAR | Status: DC | PRN
  Administered 2018-04-06: 4 mg via INTRAVENOUS

## 2018-04-06 MED ORDER — CEFAZOLIN SODIUM-DEXTROSE 2-4 GM/100ML-% IV SOLN
2.0000 g | INTRAVENOUS | Status: AC
  Administered 2018-04-06: 2 g via INTRAVENOUS

## 2018-04-06 MED ORDER — OXYCODONE HCL 5 MG PO TABS: 5.0000 mg | ORAL_TABLET | Freq: Once | ORAL | Status: DC | PRN

## 2018-04-06 MED ORDER — LIDOCAINE HCL (CARDIAC) PF 100 MG/5ML IV SOSY
PREFILLED_SYRINGE | INTRAVENOUS | Status: DC | PRN
  Administered 2018-04-06: 40 mg via INTRATRACHEAL

## 2018-04-06 MED ORDER — POVIDONE-IODINE 7.5 % EX SOLN
Freq: Once | CUTANEOUS | Status: AC
  Administered 2018-04-06: 07:00:00 via TOPICAL

## 2018-04-06 MED ORDER — FENTANYL CITRATE (PF) 100 MCG/2ML IJ SOLN: 25.0000 ug | INTRAMUSCULAR | Status: DC | PRN

## 2018-04-06 MED ORDER — LACTATED RINGERS IV SOLN
10.0000 mL/h | INTRAVENOUS | Status: DC
  Administered 2018-04-06: 10 mL/h via INTRAVENOUS

## 2018-04-06 MED ORDER — GLYCOPYRROLATE 0.2 MG/ML IJ SOLN
INTRAMUSCULAR | Status: DC | PRN
  Administered 2018-04-06: 0.2 mg via INTRAVENOUS

## 2018-04-06 MED ORDER — MIDAZOLAM HCL 5 MG/5ML IJ SOLN
INTRAMUSCULAR | Status: DC | PRN
  Administered 2018-04-06: 2 mg via INTRAVENOUS

## 2018-04-06 MED ORDER — BUPIVACAINE HCL (PF) 0.5 % IJ SOLN
INTRAMUSCULAR | Status: DC | PRN
  Administered 2018-04-06: 9 mL

## 2018-04-06 SURGICAL SUPPLY — 65 items
APL SKNCLS STERI-STRIP NONHPOA (GAUZE/BANDAGES/DRESSINGS) ×1
BANDAGE ELASTIC 4 VELCRO NS (GAUZE/BANDAGES/DRESSINGS) ×3 IMPLANT
BENZOIN TINCTURE PRP APPL 2/3 (GAUZE/BANDAGES/DRESSINGS) ×3 IMPLANT
BIT DRILL CANNULTD 2.6 X 130MM (DRILL) IMPLANT
BLADE MINI RND TIP GREEN BEAV (BLADE) ×2 IMPLANT
BLADE OSC/SAGITTAL MD 5.5X18 (BLADE) ×2 IMPLANT
BLADE OSC/SAGITTAL MD 9X18.5 (BLADE) ×2 IMPLANT
BNDG CMPR 75X41 PLY HI ABS (GAUZE/BANDAGES/DRESSINGS) ×1
BNDG ESMARK 4X12 TAN STRL LF (GAUZE/BANDAGES/DRESSINGS) ×3 IMPLANT
BNDG GAUZE 4.5X4.1 6PLY STRL (MISCELLANEOUS) ×3 IMPLANT
BNDG STRETCH 4X75 STRL LF (GAUZE/BANDAGES/DRESSINGS) ×3 IMPLANT
CANISTER SUCT 1200ML W/VALVE (MISCELLANEOUS) ×3 IMPLANT
CAST PADDING 3X4FT ST 30246 (SOFTGOODS) ×4
CLOSURE WOUND 1/4X4 (GAUZE/BANDAGES/DRESSINGS) ×1
COUNTERSICK 4.0 HEADED (MISCELLANEOUS) ×3
COVER LIGHT HANDLE UNIVERSAL (MISCELLANEOUS) ×6 IMPLANT
COVER PIN YLW 0.028-062 (MISCELLANEOUS) IMPLANT
CUFF TOURN SGL QUICK 18 (TOURNIQUET CUFF) ×2 IMPLANT
DRAPE FLUOR MINI C-ARM 54X84 (DRAPES) ×3 IMPLANT
DRILL CANNULATED 2.6 X 130MM (DRILL) ×3
DURAPREP 26ML APPLICATOR (WOUND CARE) ×3 IMPLANT
ELECT REM PT RETURN 9FT ADLT (ELECTROSURGICAL) ×3
ELECTRODE REM PT RTRN 9FT ADLT (ELECTROSURGICAL) ×1 IMPLANT
GAUZE PETRO XEROFOAM 1X8 (MISCELLANEOUS) ×3 IMPLANT
GAUZE SPONGE 4X4 12PLY STRL (GAUZE/BANDAGES/DRESSINGS) ×3 IMPLANT
GLOVE BIO SURGEON STRL SZ8 (GLOVE) ×5 IMPLANT
GOWN STRL REUS W/ TWL LRG LVL3 (GOWN DISPOSABLE) ×1 IMPLANT
GOWN STRL REUS W/ TWL XL LVL3 (GOWN DISPOSABLE) ×1 IMPLANT
GOWN STRL REUS W/TWL LRG LVL3 (GOWN DISPOSABLE) ×3
GOWN STRL REUS W/TWL XL LVL3 (GOWN DISPOSABLE) ×3
K-WIRE DBL END TROCAR 6X.045 (WIRE)
K-WIRE DBL END TROCAR 6X.062 (WIRE)
K-WIRE DBL TROCAR .045X4 ×3 IMPLANT
K-WIRE SNGL END 1.2X150 (MISCELLANEOUS) ×3
KIT TURNOVER KIT A (KITS) ×3 IMPLANT
KWIRE DBL END TROCAR 6X.045 (WIRE) IMPLANT
KWIRE DBL END TROCAR 6X.062 (WIRE) IMPLANT
KWIRE DBL TROCAR .045X4 IMPLANT
KWIRE SNGL END 1.2X150 (MISCELLANEOUS) IMPLANT
NDL HYPO 18GX1.5 BLUNT FILL (NEEDLE) IMPLANT
NDL HYPO 25GX1X1/2 BEV (NEEDLE) IMPLANT
NEEDLE HYPO 18GX1.5 BLUNT FILL (NEEDLE) ×3 IMPLANT
NEEDLE HYPO 25GX1X1/2 BEV (NEEDLE) ×3 IMPLANT
NS IRRIG 500ML POUR BTL (IV SOLUTION) ×3 IMPLANT
PACK EXTREMITY ARMC (MISCELLANEOUS) ×3 IMPLANT
PAD CAST CTTN 3X4 STRL (SOFTGOODS) IMPLANT
PADDING CAST COTTON 3X4 STRL (SOFTGOODS) ×2
PARAGON  P28 4.0 X 40S  SCREW (Screw) ×2 IMPLANT
PENCIL SMOKE EVACUATOR (MISCELLANEOUS) ×3 IMPLANT
RASP SM TEAR CROSS CUT (RASP) ×2 IMPLANT
SCREW COUNTERSINK 4.0 HEADED (MISCELLANEOUS) IMPLANT
SPLINT CAST 1 STEP 4X30 (MISCELLANEOUS) ×3 IMPLANT
SPLINT FAST PLASTER 5X30 (CAST SUPPLIES) ×2
SPLINT PLASTER CAST FAST 5X30 (CAST SUPPLIES) IMPLANT
STOCKINETTE STRL 6IN 960660 (GAUZE/BANDAGES/DRESSINGS) ×3 IMPLANT
STRAP BODY AND KNEE 60X3 (MISCELLANEOUS) ×3 IMPLANT
STRIP CLOSURE SKIN 1/4X4 (GAUZE/BANDAGES/DRESSINGS) ×2 IMPLANT
SUT ETHILON 4 0 FS (SUTURE) ×2 IMPLANT
SUT VIC AB 2-0 SH 27 (SUTURE)
SUT VIC AB 2-0 SH 27XBRD (SUTURE) IMPLANT
SUT VIC AB 3-0 SH 27 (SUTURE)
SUT VIC AB 3-0 SH 27X BRD (SUTURE) IMPLANT
SUT VIC AB 4-0 FS2 27 (SUTURE) ×3 IMPLANT
SUT VICRYL AB 3-0 FS1 BRD 27IN (SUTURE) IMPLANT
SYR 10ML LL (SYRINGE) ×2 IMPLANT

## 2018-04-06 NOTE — Anesthesia Preprocedure Evaluation (Addendum)
Anesthesia Evaluation  Patient identified by MRN, date of birth, ID band Patient awake    Reviewed: Allergy & Precautions, NPO status , [name] Chart, lab work & pertinent test results  Airway Mallampati: II  TM Distance: >3 FB     Dental   Pulmonary COPD, Current Smoker,    breath sounds clear to auscultation       Cardiovascular hypertension,  Rhythm:Regular Rate:Normal     Neuro/Psych PSYCHIATRIC DISORDERS Anxiety Depression Chronic pain - s/p extensive spinal surgery    GI/Hepatic hiatal hernia, GERD  Medicated,(+) Cirrhosis  (alcoholic - ascites drained last in 2017)      , Hepatitis - (treated with Harvoni 2018), C  Endo/Other  diabetes, Type 2  Renal/GU Hx renal tumor s/p cryoablation      Musculoskeletal  (+) Arthritis , Fibromyalgia -Fibromyalgia   Abdominal   Peds  Hematology  (+) anemia ,   Anesthesia Other Findings   Reproductive/Obstetrics                            Anesthesia Physical Anesthesia Plan  ASA: III  Anesthesia Plan: General   Post-op Pain Management:    Induction: Intravenous  PONV Risk Score and Plan: Ondansetron and Midazolam  Airway Management Planned:   Additional Equipment:   Intra-op Plan:   Post-operative Plan:   Informed Consent: I have reviewed the patients History and Physical, chart, labs and discussed the procedure including the risks, benefits and alternatives for the proposed anesthesia with the patient or authorized representative who has indicated his/her understanding and acceptance.     Plan Discussed with: CRNA  Anesthesia Plan Comments:         Anesthesia Quick Evaluation

## 2018-04-06 NOTE — Transfer of Care (Signed)
Immediate Anesthesia Transfer of Care Note  Patient: Audrey Garrett  Procedure(s) Performed: ARTHRODESIS - HALLUX/IPJOINT (Right Foot)  Patient Location: PACU  Anesthesia Type: General  Level of Consciousness: awake, alert  and patient cooperative  Airway and Oxygen Therapy: Patient Spontanous Breathing and Patient connected to supplemental oxygen  Post-op Assessment: Post-op Vital signs reviewed, [name] Cardiovascular Status Stable, Respiratory Function Stable, Patent Airway and No signs of Nausea or vomiting  Post-op Vital Signs: Reviewed and stable  Complications: No apparent anesthesia complications

## 2018-04-06 NOTE — Anesthesia Postprocedure Evaluation (Signed)
Anesthesia Post Note  Patient: Audrey Garrett  Procedure(s) Performed: ARTHRODESIS - HALLUX/IPJOINT (Right Foot)  Patient location during evaluation: PACU Anesthesia Type: General Level of consciousness: awake Pain management: pain level controlled Vital Signs Assessment: post-procedure vital signs reviewed and stable Respiratory status: respiratory function stable Cardiovascular status: stable Postop Assessment: no signs of nausea or vomiting Anesthetic complications: no    Veda Canning

## 2018-04-06 NOTE — H&P (Signed)
H and P has been reviewed and no changes are noted.  

## 2018-04-06 NOTE — Anesthesia Procedure Notes (Signed)
Procedure Name: LMA Insertion Date/Time: 04/06/2018 7:33 AM Performed by: Jeannene Patella, CRNA Pre-anesthesia Checklist: Patient identified, Emergency Drugs available, Suction available, Patient being monitored and Timeout performed Patient Re-evaluated:Patient Re-evaluated prior to induction Oxygen Delivery Method: Circle system utilized Preoxygenation: Pre-oxygenation with 100% oxygen Induction Type: IV induction Ventilation: Mask ventilation without difficulty LMA: LMA inserted LMA Size: 4.0 Tube secured with: Tape

## 2018-04-06 NOTE — Op Note (Signed)
Operative note   Surgeon: Dr. [name], DPM.    Assistant: None    Preop diagnosis: 1.  Hallux malleus right great toe 2.  Hammertoe deformity second toe right foot    Postop diagnosis: Same    Procedure:   1.  Arthrodesis IP joint right hallux with 4 oh screw fixation from [employer] and also percutaneous K wire fixation with a point over 5 K wire   2.  Hammertoe correction second toe right foot the arthroplasty of the PIP joint       EBL: Less than 5 cc    Anesthesia:general delivered by the anesthesia team.  I injected 9 cc of 0.5% bupivacaine plain at the base of the operative sites preoperatively.  Patient also has significant peripheral neuropathy    Hemostasis: Ankle tourniquet 220 mils mercury pressure for 30 minutes    Specimen: None    Complications: None    Operative indications: Chronic deformity of the digits with ulceration to the tip of the great toe that took about 2 to 3 months to heal up once it was stabilized and healed we felt like she would avoid future ulceration by surgical straightening out the toe.    Procedure:  Patient was brought into the OR and placed on the operating table in thesupine position. After anesthesia was obtained theright lower extremity was prepped and draped in usual sterile fashion.  Operative Report: This time to direct to the IP joint of the right hallux where 2 similar incisions were made transversely across the PIP joint.  This ellipse skin was removed the extensor tendon was then divided incised transversely reflected proximally off the proximal phalanx head and distally out the distal phalanx base.  Once the cartilage was exposed it was resected with sagittal saw power equipment.  Brawl bone was noted on both regions and these were then fenestrated to allow for bleeding and better fusion.  This point a K wire was run through the distal phalanx and into the proximal phalanx.  Checked FluoroScan good position and direction and  apposition of the distal and proximal phalanges were noted as well as good position of the K wire.  At this point small incision was made at the distal tip of the toe where the K wire exited.  The area was then countersunk and then drilled and a 40 mm 4 oh screw was then placed across the fusion site going from the distal phalanx proximally into the shaft of the proximal phalanx.  Once this was tightened down the area was copiously irrigated checked to make sure there is no soft tissue interposition which there were not.  There is checked FluoroScan good position correction and fixation were noted.  This point I elected to run a K wire across the area this fracture had stability since she does have some osteopenia.  After copious irrigation the extensor tendon was closed with 4-0 Vicryl simple erupted and continuous stitch combination across the IP joint area.  Skin was then closed with 4-0 nylon simple interrupted and horizontal mattress sutures.  Distally the incision was closed with 4-0 nylon simple erupted suture.  Second procedure: This time attention directed to the second toe of the right foot where an linear incision made over the PIP joint.  This was deepened sharp blunt dissection bleeders clamped and bovied as required.  The extensor tendon overlying the joint was then divided incised transversely reflected proximally.  Sagittal saw power grip was then used to remove the proximal  phalanx head.  Once this was removed appropriately the area was copiously irrigated the extensor tendon was reapproximated with 4 simple interrupted sutures with 4-0 Vicryl.  This was seen to help straighten the toe out and hold it in a better position.  Skin was then closed with 4-0 nylon simple interrupted horizontal mattress combination also.  This time sterile compressive dressings were placed across wound consisting of  Xeroform gauze 4 x 4's Kling and Kerlix.  A posterior splint was placed on the right foot leg in the  operating room.    Patient tolerated the procedure and anesthesia well.  Was transported from the OR to the PACU with all vital signs stable and vascular status intact. To be discharged per routine protocol.  Will follow up in approximately 1 week in the outpatient clinic.

## 2018-04-07 ENCOUNTER — Encounter: Payer: Self-pay | Admitting: Podiatry

## 2018-04-20 ENCOUNTER — Telehealth: Payer: Self-pay | Admitting: Pain Medicine

## 2018-04-20 ENCOUNTER — Other Ambulatory Visit: Payer: Self-pay

## 2018-04-20 ENCOUNTER — Encounter: Payer: Self-pay | Admitting: Nurse Practitioner

## 2018-04-20 ENCOUNTER — Ambulatory Visit: Payer: Medicare Other | Attending: Nurse Practitioner | Admitting: Nurse Practitioner

## 2018-04-20 VITALS — BP 107/64 | HR 94 | Temp 98.2°F | Ht 70.0 in | Wt 168.0 lb

## 2018-04-20 DIAGNOSIS — M79605 Pain in left leg: Secondary | ICD-10-CM

## 2018-04-20 DIAGNOSIS — M47816 Spondylosis without myelopathy or radiculopathy, lumbar region: Secondary | ICD-10-CM | POA: Diagnosis not present

## 2018-04-20 DIAGNOSIS — M7989 Other specified soft tissue disorders: Secondary | ICD-10-CM

## 2018-04-20 DIAGNOSIS — M533 Sacrococcygeal disorders, not elsewhere classified: Secondary | ICD-10-CM | POA: Diagnosis not present

## 2018-04-20 DIAGNOSIS — G8929 Other chronic pain: Secondary | ICD-10-CM | POA: Diagnosis present

## 2018-04-20 MED ORDER — ORPHENADRINE CITRATE 30 MG/ML IJ SOLN
60.0000 mg | Freq: Once | INTRAMUSCULAR | Status: AC
  Administered 2018-04-20: 60 mg via INTRAMUSCULAR
  Filled 2018-04-20: qty 2

## 2018-04-20 MED ORDER — KETOROLAC TROMETHAMINE 60 MG/2ML IM SOLN
60.0000 mg | Freq: Once | INTRAMUSCULAR | Status: AC
  Administered 2018-04-20: 60 mg via INTRAMUSCULAR
  Filled 2018-04-20: qty 2

## 2018-04-20 NOTE — Patient Instructions (Signed)
____________________________________________________________________________________________  Medication Rules  Purpose: To inform patients, and their family members, of our rules and regulations.  Applies to: All patients receiving prescriptions (written or electronic).  Pharmacy of record: Pharmacy where electronic prescriptions will be sent. If written prescriptions are taken to a different pharmacy, please inform the nursing staff. The pharmacy listed in the electronic medical record should be the one where you would like electronic prescriptions to be sent.  Electronic prescriptions: In compliance with the [place] Strengthen Opioid Misuse Prevention (STOP) Act of 2017 (Session Law 2017-74/H243), effective March 29, 2018, all controlled substances must be electronically prescribed. Calling prescriptions to the pharmacy will cease to exist.  Prescription refills: Only during scheduled appointments. Applies to all prescriptions.  NOTE: The following applies primarily to controlled substances (Opioid* Pain Medications).   [name] responsibilities: 1. Pain Pills: Bring all pain pills to every appointment (except for procedure appointments). 2. Pill Bottles: Bring pills in original pharmacy bottle. Always bring the newest bottle. Bring bottle, even if empty. 3. Medication refills: You are responsible for knowing and keeping track of what medications you take and those you need refilled. The day before your appointment: write a list of all prescriptions that need to be refilled. The day of the appointment: give the list to the admitting nurse. Prescriptions will be written only during appointments. If you forget a medication: it will not be "Called in", "Faxed", or "electronically sent". You will need to get another appointment to get these prescribed. No early refills. Do not call asking to have your prescription filled early. 4. Prescription Accuracy: You are responsible for  carefully inspecting your prescriptions before leaving our office. Have the discharge nurse carefully go over each prescription with you, before taking them home. Make sure that your name is accurately spelled, that your address is correct. Check the name and dose of your medication to make sure it is accurate. Check the number of pills, and the written instructions to make sure they are clear and accurate. Make sure that you are given enough medication to last until your next medication refill appointment. 5. Taking Medication: Take medication as prescribed. When it comes to controlled substances, taking less pills or less frequently than prescribed is permitted and encouraged. Never take more pills than instructed. Never take medication more frequently than prescribed.  6. Inform other Doctors: Always inform, all of your healthcare providers, of all the medications you take. 7. Pain Medication from other Providers: You are not allowed to accept any additional pain medication from any other Doctor or Healthcare provider. There are two exceptions to this rule. (see below) In the event that you require additional pain medication, you are responsible for notifying us, as stated below. 8. Medication Agreement: You are responsible for carefully reading and following our Medication Agreement. This must be signed before receiving any prescriptions from our practice. Safely store a copy of your signed Agreement. Violations to the Agreement will result in no further prescriptions. (Additional copies of our Medication Agreement are available upon request.) 9. Laws, Rules, & Regulations: All patients are expected to follow all Federal and State Laws, Statutes, Rules, & Regulations. Ignorance of the Laws does not constitute a valid excuse. The use of any illegal substances is prohibited. 10. Adopted CDC guidelines & recommendations: Target dosing levels will be at or below 60 MME/day. Use of benzodiazepines** is not  recommended.  Exceptions: There are only two exceptions to the rule of not receiving pain medications from other Healthcare Providers. 1.   Exception #1 (Emergencies): In the event of an emergency (i.e.: accident requiring emergency care), you are allowed to receive additional pain medication. However, you are responsible for: As soon as you are able, call our office [phone], at any time of the day or night, and leave a message stating your name, the date and nature of the emergency, and the name and dose of the medication prescribed. In the event that your call is answered by a member of our staff, make sure to document and save the date, time, and the name of the person that took your information.  2. Exception #2 (Planned Surgery): In the event that you are scheduled by another doctor or dentist to have any type of surgery or procedure, you are allowed (for a period no longer than 30 days), to receive additional pain medication, for the acute post-op pain. However, in this case, you are responsible for picking up a copy of our "Post-op Pain Management for Surgeons" handout, and giving it to your surgeon or dentist. This document is available at our office, and does not require an appointment to obtain it. Simply go to our office during business hours (Monday-Thursday from 8:00 AM to 4:00 PM) (Friday 8:00 AM to 12:00 Noon) or if you have a scheduled appointment with us, prior to your surgery, and ask for it by name. In addition, you will need to provide us with your name, name of your surgeon, type of surgery, and date of procedure or surgery.  *Opioid medications include: morphine, codeine, oxycodone, oxymorphone, hydrocodone, hydromorphone, meperidine, tramadol, tapentadol, buprenorphine, fentanyl, methadone. **Benzodiazepine medications include: diazepam (Valium), alprazolam (Xanax), clonazepam (Klonopine), lorazepam (Ativan), clorazepate (Tranxene), chlordiazepoxide (Librium), estazolam (Prosom),  oxazepam (Serax), temazepam (Restoril), triazolam (Halcion) (Last updated: 05/26/2017) ____________________________________________________________________________________________    

## 2018-04-20 NOTE — Telephone Encounter (Signed)
Ok  Did you make her ap apt? It's ok.

## 2018-04-20 NOTE — Progress Notes (Signed)
[name] Name: Audrey Garrett  MRN: [id number]  Referring Provider: Jodi Marble, MD  DOB: November 18, 1960  PCP: Jodi Marble, MD  DOS: 04/20/2018  Note by: Vevelyn Francois NP  Service setting: Ambulatory outpatient  Specialty: Interventional Pain Management  Location: [facility] (AMB) Pain Management Facility    Patient type: Established    Primary Reason(s) for Visit: Evaluation of chronic illnesses with exacerbation, or progression (Level of risk: moderate) CC: Back Pain  HPI  Audrey Garrett is a 58 y.o. year old, female patient, who comes today for a follow-up evaluation. She has Insomnia, persistent; BP (high blood pressure); Cervical radicular pain; Uncomplicated opioid dependence ([place]); Long term prescription opiate use; Failed back surgical syndrome; Lumbar facet syndrome (Bilateral) (L>R); Osteoarthritis of spine with radiculopathy, lumbar region; Musculoskeletal pain; Chronic low back pain (Primary Source of Pain) (Bilateral) (L>R); HCV (hepatitis C virus); At risk for osteopenia; Lumbar spondylosis; Chronic lumbar radicular pain (Secondary source of pain) (Left); Chronic neck pain; Chronic lower extremity pain (Left); Chronic sacroiliac joint pain (Bilateral) (R>L); Chronic upper back pain; Long term current use of opiate analgesic; Encounter for chronic pain management; Hypomagnesemia (low magnesium levels); Hypotension; Anemia; Thrombocytopenia ([place]); Coagulopathy ([place]); Hyperglycemia; Polyneuropathy; Urinary retention; Difficulty urinating; Nephrolithiasis; Alcoholic cirrhosis of liver with ascites ([place]); Depression, major, recurrent, moderate ([place]); Abdominal pain, chronic, epigastric; Hepatic cirrhosis ([place]); Acute GI bleeding; Neurogenic pain; S/P thoracolumbar fusion (T5-L5); Grade 1 Retrolisthesis (4 mm) of C5 over C6 and C6 over C7; Cervical foraminal stenosis (Left C5-6; Bilateral C6-7); Hernia of anterior abdominal wall; Incarcerated umbilical hernia; Controlled type 2 diabetes mellitus  with hyperglycemia ([place]); Acute blood loss anemia; Chronic pain syndrome; Malnutrition of moderate degree; Iron deficiency anemia due to chronic blood loss; Opiate use; RLS (restless legs syndrome); Bilateral leg edema; Depression, major, in remission ([place]); Chronic hip pain (Bilateral); Osteoarthritis of hip (Bilateral); Personal history of tobacco use, presenting hazards to health; Chronic groin pain, left; Malignant neoplasm of upper lobe of right lung ([place]); Acute postoperative pain; Chronic hepatitis C without hepatic coma ([place]); Cirrhosis of liver with ascites ([place]); Renal lesion; Iron deficiency anemia; Chronic left sacroiliac joint pain; and Swelling of left lower extremity on their problem list. Audrey Garrett was last seen on 02/27/2018. Her primarily concern today is the Back Pain  Pain Assessment: Location:   Back Radiating: pain radiaties down left leg Onset: More than a month ago Duration: Chronic pain Quality: Shooting, Aching, Throbbing, Constant Severity: 8 /10 (subjective, self-reported pain score)  Note: Reported level is compatible with observation. Clinically the patient looks like a 2/10 A 2/10 is viewed as "Mild to Moderate" and described as noticeable and distracting. Impossible to hide from other people. More frequent flare-ups. Still possible to adapt and function close to normal. It can be very annoying and may have occasional stronger flare-ups. With discipline, patients may get used to it and adapt. Discrepancy may suggest a "suffering" (psychosocial) component. When using our objective Pain Scale, levels between 6 and 10/10 are said to belong in an emergency room, as it progressively worsens from a 6/10, described as severely limiting, requiring emergency care not usually available at an outpatient pain management facility. At a 6/10 level, communication becomes difficult and requires great effort. Assistance to reach the emergency department may be required. Facial flushing and  profuse sweating along with potentially dangerous increases in heart rate and blood pressure will be evident. Effect on ADL: no prolonged walking, unable to do anything at present time due to the pain  Timing: Constant Modifying factors: sitting down, medication BP: 107/64  HR: 94  Further details on both, my assessment(s), as well as the proposed treatment plan, please see below. She is SP right foot surgery on 04/06/2018. She was wearing a cast. She fell on 1/9. She has suffered 2 falls since the surgery and did not seek treatment at the time. She did follow up with the surgeon. She woke up last night in pain. She feels like it was going down her back into her buttock and down her left leg.  She is having a dull pain going down her left leg. She describes it as a twisting. She feels like is worse below her knee and into her foot. She is having swelling in her leg and foot. She does have some weakness in her left leg. She is on lasix and has been for several years. She feels like this pain is worse and she is not sure of the reason. She denies any pain in right foot or swelling.   Laboratory Chemistry  Inflammation Markers (CRP: Acute Phase) (ESR: Chronic Phase) Lab Results  Component Value Date   CRP <0.5 04/03/2015   ESRSEDRATE 27 04/03/2015   LATICACIDVEN 2.7 (HH) 04/23/2017                         Rheumatology Markers No results found for: RF, ANA, LABURIC, URICUR, LYMEIGGIGMAB, LYMEABIGMQN, HLAB27                      Renal Function Markers Lab Results  Component Value Date   BUN 6 02/02/2018   CREATININE 0.57 02/02/2018   GFRAA >60 02/02/2018   GFRNONAA >60 02/02/2018                             Hepatic Function Markers Lab Results  Component Value Date   AST 64 (H) 02/02/2018   ALT 34 02/02/2018   ALBUMIN 3.7 02/02/2018   ALKPHOS 67 02/02/2018   LIPASE 32 11/06/2015   AMMONIA 12 09/14/2016                        Electrolytes Lab Results  Component Value Date   NA  136 02/02/2018   K 4.9 02/02/2018   CL 103 02/02/2018   CALCIUM 9.1 02/02/2018   MG 1.9 01/22/2016   PHOS 3.6 11/23/2013                        Neuropathy Markers Lab Results  Component Value Date   VITAMINB12 676 10/04/2017   FOLATE 7.8 10/04/2017   HGBA1C 5.2 12/05/2017                        CNS Tests No results found for: COLORCSF, APPEARCSF, RBCCOUNTCSF, WBCCSF, POLYSCSF, LYMPHSCSF, EOSCSF, PROTEINCSF, GLUCCSF, JCVIRUS, CSFOLI, IGGCSF                      Bone Pathology Markers No results found for: VD25OH, VD125OH2TOT, [id number], [id number], 25OHVITD1, 25OHVITD2, 25OHVITD3, TESTOFREE, TESTOSTERONE                       Coagulation Parameters Lab Results  Component Value Date   INR 1.15 12/14/2017   LABPROT 14.6 12/14/2017   APTT 35 08/24/2017   PLT 144 (L)  02/02/2018                        Cardiovascular Markers Lab Results  Component Value Date   BNP 929 (H) 09/23/2013   CKTOTAL 1,597 (H) 12/17/2012   TROPONINI <0.03 05/07/2015   HGB 12.0 02/02/2018   HCT 36.3 02/02/2018                         CA Markers No results found for: CEA, CA125, LABCA2                      Note: Lab results reviewed.  Recent Diagnostic Imaging Review  Cervical Imaging:  Cervical CT w contrast:  Results for orders placed during the hospital encounter of 11/29/13  CT Cervical Spine W Contrast   Narrative CLINICAL DATA:  Neck and back pain.  Bilateral lower extremity pain.  FLUOROSCOPY TIME:  1 min 45 seconds  PROCEDURE: LUMBAR PUNCTURE FOR CERVICAL LUMBAR AND THORACIC MYELOGRAM  CERVICAL AND LUMBAR AND THORACIC MYELOGRAM  CT CERVICAL MYELOGRAM  CT LUMBAR MYELOGRAM  CT THORACIC MYELOGRAM  After thorough discussion of risks and benefits of the procedure including bleeding, infection, injury to nerves, blood vessels, adjacent structures as well as headache and CSF leak, written and oral informed consent was obtained. Consent was obtained by Dr. Logan Bores.  Patient  was positioned prone on the fluoroscopy table. Local anesthesia was provided with 1% lidocaine without epinephrine after prepped and draped in the usual sterile fashion. Puncture was performed at L3-4 using a 3 1/2 inch 22-gauge spinal needle via a midline approach. Using a single pass through the dura, the needle was placed within the thecal sac, with return of clear CSF. 10 mL Omnipaque-300 was injected into the thecal sac, with normal opacification of the nerve roots and cauda equina consistent with free flow within the subarachnoid space. The patient was then moved to the trendelenburg position and contrast flowed into the Thoracic and Cervical spine regions.  I personally performed the lumbar puncture and administered the intrathecal contrast. I also personally supervised acquisition of the myelogram images.  TECHNIQUE: Contiguous axial images were obtained through the Cervical, Thoracic, and Lumbar spine after the intrathecal infusion of contrast. Coronal and sagittal reconstructions were obtained of the axial image sets.  FINDINGS: CERVICAL, THORACIC AND LUMBAR MYELOGRAM FINDINGS:  There is slight retrolisthesis of C5 on C6. Moderate disc space narrowing is present at C5-6 and C6-7 with moderate end plate osteophyte formation, resulting in moderate ventral epidural defects at each level. Thoracic vertebral alignment is normal. Vertebral body heights are preserved without evidence of compression fracture. Extensive posterior spinal fusion has been performed from T5 through the lumbar spine and sacrum with fixation screws in the ilia as well. No thoracic spinal stenosis is identified. There is mild straightening of the normal lumbar lordosis without listhesis on upright neutral, flexion, and extension imaging. Mild disc space narrowing is present at L2-3 and L3-4. Dilated nerve root sleeves are noted bilaterally at S1.  CT CERVICAL MYELOGRAM FINDINGS:  There is  straightening of the normal cervical lordosis. There is grade 1 retrolisthesis of C5 on C6 and C6 on C7, measuring approximately 4 mm each. Moderate disc space narrowing and endplate osteophyte formation are present at these 2 levels. The cervical spinal canal is capacious on a developmental basis. The cervical spinal cord is normal in caliber. Mild scarring is noted in the lung apices.  C2-3 through C4-5:  Negative.  C5-6: Broad-based posterior disc osteophyte complex mildly effaces the ventral thecal sac without spinal stenosis. Asymmetric right-sided uncovertebral arthrosis results in moderate right neural foraminal stenosis.  C6-7: Broad-based posterior disc osteophyte complex mildly effaces the ventral thecal sac without spinal canal stenosis. Spinal canal maintains an AP diameter of 10 mm. Uncovertebral arthrosis results in mild bilateral neural foraminal stenosis.  C7-T1: No disc herniation or stenosis. Small left-sided cervical rib which is fused with the proximal aspect of the first thoracic rib.  CT THORACIC MYELOGRAM FINDINGS:  Prior thoracolumbar fusion has been performed. Bilateral pedicle screws are present at T5, T6, T8, T10, and T12. The right T5 screw courses lateral to the pedicle and terminates lateral to the vertebral body. The left T6 screw traverses the lateral portion of the pedicle, terminating slightly lateral to the vertebral body. The right T12 screw partially traverses the right lateral recess. There is no evidence of screw loosening. Bilateral posterior fusion masses are present.  The thoracic spinal canal is overall capacious. There is a small central disc protrusion at T7-8 which may contact the spinal cord without spinal stenosis or mass effect on the cord. Similar, small central disc protrusions are also seen T8-9 and T9-10. There is mild circumferential disc bulging at T10-11 without spinal stenosis. There is also mild disc bulging at T11-12,  without evidence of spinal stenosis although evaluation is partially limited at this level due to dependent layering of contrast in the dorsal canal. There is mild right neural foraminal stenosis at T10-11 due to prominent facet arthrosis.  There are small bilateral pleural effusions. Dependent subsegmental atelectasis is present in both lower lobes.  CT LUMBAR MYELOGRAM FINDINGS:  There is mild straightening of the normal lumbar lordosis. There is no listhesis. Vertebral body heights are preserved. Mild disc space narrowing is present at L2-3 and L3-4. Thoracolumbar fusion hardware is again seen, with bilateral pedicle screws from L1-L5 as well as at S1. Screws also extend into the ilia. There is no evidence of screw loosening. 12 mm, well-circumscribed lucency in the L4 vertebral body may represent a hemangioma. Contrast was primarily layering dependently in the lower lumbar spine and sacral canal during initial supine imaging of the entire spine. Repeat imaging of the lumbar spine with the patient prone yielded better thecal sac opacification. Bilateral S1 and right S2 and S3 nerve root sleeves are dilated. Moderate atherosclerotic aortic calcification is noted. Punctate calculi are noted in both kidneys without hydronephrosis.  L1-2: Prior posterior decompression. No disc herniation or stenosis.  L2-3: Prior posterior decompression. Mild circumferential disc bulge without stenosis.  L3-4: Prior posterior decompression. Minimal disc bulging and endplate spurring without stenosis.  L4-5: Prior posterior decompression. Small left foraminal disc protrusion without stenosis.  L5-S1: Prior posterior decompression. Mild disc bulge without stenosis.  IMPRESSION: 1. Moderate cervical spondylosis at C5-6 and C6-7 without cervical spinal stenosis. Uncovertebral arthrosis results in moderate right neural foraminal stenosis at C5-6 and mild bilateral neural foraminal stenosis at  C6-7. 2. Prior posterior fusion from T5 through the sacrum as above. Right T12 screw partially traverses the right lateral recess. 3. Mild lower thoracic spondylosis without thoracic spinal stenosis. Mild right neural foraminal stenosis at T10-11 due to asymmetric facet arthrosis. 4. Decompressed lumbar spinal canal. Mild, diffuse lumbar spondylosis without stenosis.   Electronically Signed   By: Logan Bores   On: 11/29/2013 17:04   Thoracic Imaging:  Thoracic CT w contrast:  Results for orders placed during the  hospital encounter of 11/29/13  CT Thoracic Spine W Contrast   Narrative CLINICAL DATA:  Neck and back pain.  Bilateral lower extremity pain.  FLUOROSCOPY TIME:  1 min 45 seconds  PROCEDURE: LUMBAR PUNCTURE FOR CERVICAL LUMBAR AND THORACIC MYELOGRAM  CERVICAL AND LUMBAR AND THORACIC MYELOGRAM  CT CERVICAL MYELOGRAM  CT LUMBAR MYELOGRAM  CT THORACIC MYELOGRAM  After thorough discussion of risks and benefits of the procedure including bleeding, infection, injury to nerves, blood vessels, adjacent structures as well as headache and CSF leak, written and oral informed consent was obtained. Consent was obtained by Dr. Logan Bores.  Patient was positioned prone on the fluoroscopy table. Local anesthesia was provided with 1% lidocaine without epinephrine after prepped and draped in the usual sterile fashion. Puncture was performed at L3-4 using a 3 1/2 inch 22-gauge spinal needle via a midline approach. Using a single pass through the dura, the needle was placed within the thecal sac, with return of clear CSF. 10 mL Omnipaque-300 was injected into the thecal sac, with normal opacification of the nerve roots and cauda equina consistent with free flow within the subarachnoid space. The patient was then moved to the trendelenburg position and contrast flowed into the Thoracic and Cervical spine regions.  I personally performed the lumbar puncture and administered  the intrathecal contrast. I also personally supervised acquisition of the myelogram images.  TECHNIQUE: Contiguous axial images were obtained through the Cervical, Thoracic, and Lumbar spine after the intrathecal infusion of contrast. Coronal and sagittal reconstructions were obtained of the axial image sets.  FINDINGS: CERVICAL, THORACIC AND LUMBAR MYELOGRAM FINDINGS:  There is slight retrolisthesis of C5 on C6. Moderate disc space narrowing is present at C5-6 and C6-7 with moderate end plate osteophyte formation, resulting in moderate ventral epidural defects at each level. Thoracic vertebral alignment is normal. Vertebral body heights are preserved without evidence of compression fracture. Extensive posterior spinal fusion has been performed from T5 through the lumbar spine and sacrum with fixation screws in the ilia as well. No thoracic spinal stenosis is identified. There is mild straightening of the normal lumbar lordosis without listhesis on upright neutral, flexion, and extension imaging. Mild disc space narrowing is present at L2-3 and L3-4. Dilated nerve root sleeves are noted bilaterally at S1.  CT CERVICAL MYELOGRAM FINDINGS:  There is straightening of the normal cervical lordosis. There is grade 1 retrolisthesis of C5 on C6 and C6 on C7, measuring approximately 4 mm each. Moderate disc space narrowing and endplate osteophyte formation are present at these 2 levels. The cervical spinal canal is capacious on a developmental basis. The cervical spinal cord is normal in caliber. Mild scarring is noted in the lung apices.  C2-3 through C4-5:  Negative.  C5-6: Broad-based posterior disc osteophyte complex mildly effaces the ventral thecal sac without spinal stenosis. Asymmetric right-sided uncovertebral arthrosis results in moderate right neural foraminal stenosis.  C6-7: Broad-based posterior disc osteophyte complex mildly effaces the ventral thecal sac without  spinal canal stenosis. Spinal canal maintains an AP diameter of 10 mm. Uncovertebral arthrosis results in mild bilateral neural foraminal stenosis.  C7-T1: No disc herniation or stenosis. Small left-sided cervical rib which is fused with the proximal aspect of the first thoracic rib.  CT THORACIC MYELOGRAM FINDINGS:  Prior thoracolumbar fusion has been performed. Bilateral pedicle screws are present at T5, T6, T8, T10, and T12. The right T5 screw courses lateral to the pedicle and terminates lateral to the vertebral body. The left T6 screw traverses the  lateral portion of the pedicle, terminating slightly lateral to the vertebral body. The right T12 screw partially traverses the right lateral recess. There is no evidence of screw loosening. Bilateral posterior fusion masses are present.  The thoracic spinal canal is overall capacious. There is a small central disc protrusion at T7-8 which may contact the spinal cord without spinal stenosis or mass effect on the cord. Similar, small central disc protrusions are also seen T8-9 and T9-10. There is mild circumferential disc bulging at T10-11 without spinal stenosis. There is also mild disc bulging at T11-12, without evidence of spinal stenosis although evaluation is partially limited at this level due to dependent layering of contrast in the dorsal canal. There is mild right neural foraminal stenosis at T10-11 due to prominent facet arthrosis.  There are small bilateral pleural effusions. Dependent subsegmental atelectasis is present in both lower lobes.  CT LUMBAR MYELOGRAM FINDINGS:  There is mild straightening of the normal lumbar lordosis. There is no listhesis. Vertebral body heights are preserved. Mild disc space narrowing is present at L2-3 and L3-4. Thoracolumbar fusion hardware is again seen, with bilateral pedicle screws from L1-L5 as well as at S1. Screws also extend into the ilia. There is no evidence of screw  loosening. 12 mm, well-circumscribed lucency in the L4 vertebral body may represent a hemangioma. Contrast was primarily layering dependently in the lower lumbar spine and sacral canal during initial supine imaging of the entire spine. Repeat imaging of the lumbar spine with the patient prone yielded better thecal sac opacification. Bilateral S1 and right S2 and S3 nerve root sleeves are dilated. Moderate atherosclerotic aortic calcification is noted. Punctate calculi are noted in both kidneys without hydronephrosis.  L1-2: Prior posterior decompression. No disc herniation or stenosis.  L2-3: Prior posterior decompression. Mild circumferential disc bulge without stenosis.  L3-4: Prior posterior decompression. Minimal disc bulging and endplate spurring without stenosis.  L4-5: Prior posterior decompression. Small left foraminal disc protrusion without stenosis.  L5-S1: Prior posterior decompression. Mild disc bulge without stenosis.  IMPRESSION: 1. Moderate cervical spondylosis at C5-6 and C6-7 without cervical spinal stenosis. Uncovertebral arthrosis results in moderate right neural foraminal stenosis at C5-6 and mild bilateral neural foraminal stenosis at C6-7. 2. Prior posterior fusion from T5 through the sacrum as above. Right T12 screw partially traverses the right lateral recess. 3. Mild lower thoracic spondylosis without thoracic spinal stenosis. Mild right neural foraminal stenosis at T10-11 due to asymmetric facet arthrosis. 4. Decompressed lumbar spinal canal. Mild, diffuse lumbar spondylosis without stenosis.   Electronically Signed   By: Logan Bores   On: 11/29/2013 17:04    Thoracic DG 2-3 views:  Results for orders placed in visit on 01/06/15  DG Thoracic Spine 2 View   Lumbosacral Imaging:  Results for orders placed during the hospital encounter of 09/22/15  CT LUMBAR SPINE WO CONTRAST   Narrative CLINICAL DATA:  Acute exacerbation of chronic back  pain.  EXAM: CT LUMBAR SPINE WITHOUT CONTRAST  TECHNIQUE: Multidetector CT imaging of the lumbar spine was performed without intravenous contrast administration. Multiplanar CT image reconstructions were also generated.  COMPARISON:  Lumbar radiographs from 6 10/2015  FINDINGS: There has been previous posterior fusion using pedicle screws and rods from the lower thoracic region to the sacrum and pelvis. Within limits for detection on noncontrast CT lumbar, I do not see an acute fracture, intraspinal hematoma, or lumbar compressive lesion. No gross hardware malposition. Solid posterior arthrodesis through least the L1 through S1 segments.  Aortic  atherosclerosis. Renal cortical calcifications, incompletely evaluated. Suspected gallstones.  IMPRESSION: No acute findings status post thoracosacral fusion.   Electronically Signed   By: Staci Righter M.D.   On: 09/25/2015 18:42    Lumbar CT w contrast:  Results for orders placed during the hospital encounter of 11/29/13  CT Lumbar Spine W Contrast   Narrative CLINICAL DATA:  Neck and back pain.  Bilateral lower extremity pain.  FLUOROSCOPY TIME:  1 min 45 seconds  PROCEDURE: LUMBAR PUNCTURE FOR CERVICAL LUMBAR AND THORACIC MYELOGRAM  CERVICAL AND LUMBAR AND THORACIC MYELOGRAM  CT CERVICAL MYELOGRAM  CT LUMBAR MYELOGRAM  CT THORACIC MYELOGRAM  After thorough discussion of risks and benefits of the procedure including bleeding, infection, injury to nerves, blood vessels, adjacent structures as well as headache and CSF leak, written and oral informed consent was obtained. Consent was obtained by Dr. Logan Bores.  Patient was positioned prone on the fluoroscopy table. Local anesthesia was provided with 1% lidocaine without epinephrine after prepped and draped in the usual sterile fashion. Puncture was performed at L3-4 using a 3 1/2 inch 22-gauge spinal needle via a midline approach. Using a single pass through the  dura, the needle was placed within the thecal sac, with return of clear CSF. 10 mL Omnipaque-300 was injected into the thecal sac, with normal opacification of the nerve roots and cauda equina consistent with free flow within the subarachnoid space. The patient was then moved to the trendelenburg position and contrast flowed into the Thoracic and Cervical spine regions.  I personally performed the lumbar puncture and administered the intrathecal contrast. I also personally supervised acquisition of the myelogram images.  TECHNIQUE: Contiguous axial images were obtained through the Cervical, Thoracic, and Lumbar spine after the intrathecal infusion of contrast. Coronal and sagittal reconstructions were obtained of the axial image sets.  FINDINGS: CERVICAL, THORACIC AND LUMBAR MYELOGRAM FINDINGS:  There is slight retrolisthesis of C5 on C6. Moderate disc space narrowing is present at C5-6 and C6-7 with moderate end plate osteophyte formation, resulting in moderate ventral epidural defects at each level. Thoracic vertebral alignment is normal. Vertebral body heights are preserved without evidence of compression fracture. Extensive posterior spinal fusion has been performed from T5 through the lumbar spine and sacrum with fixation screws in the ilia as well. No thoracic spinal stenosis is identified. There is mild straightening of the normal lumbar lordosis without listhesis on upright neutral, flexion, and extension imaging. Mild disc space narrowing is present at L2-3 and L3-4. Dilated nerve root sleeves are noted bilaterally at S1.  CT CERVICAL MYELOGRAM FINDINGS:  There is straightening of the normal cervical lordosis. There is grade 1 retrolisthesis of C5 on C6 and C6 on C7, measuring approximately 4 mm each. Moderate disc space narrowing and endplate osteophyte formation are present at these 2 levels. The cervical spinal canal is capacious on a developmental basis. The  cervical spinal cord is normal in caliber. Mild scarring is noted in the lung apices.  C2-3 through C4-5:  Negative.  C5-6: Broad-based posterior disc osteophyte complex mildly effaces the ventral thecal sac without spinal stenosis. Asymmetric right-sided uncovertebral arthrosis results in moderate right neural foraminal stenosis.  C6-7: Broad-based posterior disc osteophyte complex mildly effaces the ventral thecal sac without spinal canal stenosis. Spinal canal maintains an AP diameter of 10 mm. Uncovertebral arthrosis results in mild bilateral neural foraminal stenosis.  C7-T1: No disc herniation or stenosis. Small left-sided cervical rib which is fused with the proximal aspect of the first thoracic  rib.  CT THORACIC MYELOGRAM FINDINGS:  Prior thoracolumbar fusion has been performed. Bilateral pedicle screws are present at T5, T6, T8, T10, and T12. The right T5 screw courses lateral to the pedicle and terminates lateral to the vertebral body. The left T6 screw traverses the lateral portion of the pedicle, terminating slightly lateral to the vertebral body. The right T12 screw partially traverses the right lateral recess. There is no evidence of screw loosening. Bilateral posterior fusion masses are present.  The thoracic spinal canal is overall capacious. There is a small central disc protrusion at T7-8 which may contact the spinal cord without spinal stenosis or mass effect on the cord. Similar, small central disc protrusions are also seen T8-9 and T9-10. There is mild circumferential disc bulging at T10-11 without spinal stenosis. There is also mild disc bulging at T11-12, without evidence of spinal stenosis although evaluation is partially limited at this level due to dependent layering of contrast in the dorsal canal. There is mild right neural foraminal stenosis at T10-11 due to prominent facet arthrosis.  There are small bilateral pleural effusions. Dependent  subsegmental atelectasis is present in both lower lobes.  CT LUMBAR MYELOGRAM FINDINGS:  There is mild straightening of the normal lumbar lordosis. There is no listhesis. Vertebral body heights are preserved. Mild disc space narrowing is present at L2-3 and L3-4. Thoracolumbar fusion hardware is again seen, with bilateral pedicle screws from L1-L5 as well as at S1. Screws also extend into the ilia. There is no evidence of screw loosening. 12 mm, well-circumscribed lucency in the L4 vertebral body may represent a hemangioma. Contrast was primarily layering dependently in the lower lumbar spine and sacral canal during initial supine imaging of the entire spine. Repeat imaging of the lumbar spine with the patient prone yielded better thecal sac opacification. Bilateral S1 and right S2 and S3 nerve root sleeves are dilated. Moderate atherosclerotic aortic calcification is noted. Punctate calculi are noted in both kidneys without hydronephrosis.  L1-2: Prior posterior decompression. No disc herniation or stenosis.  L2-3: Prior posterior decompression. Mild circumferential disc bulge without stenosis.  L3-4: Prior posterior decompression. Minimal disc bulging and endplate spurring without stenosis.  L4-5: Prior posterior decompression. Small left foraminal disc protrusion without stenosis.  L5-S1: Prior posterior decompression. Mild disc bulge without stenosis.  IMPRESSION: 1. Moderate cervical spondylosis at C5-6 and C6-7 without cervical spinal stenosis. Uncovertebral arthrosis results in moderate right neural foraminal stenosis at C5-6 and mild bilateral neural foraminal stenosis at C6-7. 2. Prior posterior fusion from T5 through the sacrum as above. Right T12 screw partially traverses the right lateral recess. 3. Mild lower thoracic spondylosis without thoracic spinal stenosis. Mild right neural foraminal stenosis at T10-11 due to asymmetric facet arthrosis. 4. Decompressed  lumbar spinal canal. Mild, diffuse lumbar spondylosis without stenosis.   Electronically Signed   By: Logan Bores   On: 11/29/2013 17:04    . Lumbar DG 2-3 views:  Results for orders placed during the hospital encounter of 09/04/15  DG Lumbar Spine 2-3 Views   Narrative CLINICAL DATA:  Low back pain since Saturday.  EXAM: LUMBAR SPINE - 2-3 VIEW  COMPARISON:  CT abdomen 01/17/2015  FINDINGS: There is no evidence of lumbar spine fracture. Alignment is anatomic. Mild degenerative disc disease at L2-3, L4-5 and L5-S1. Posterior lumbar fusion from T10 through S1 with bilateral ileal screws. No hardware failure or complication.  IMPRESSION: 1.  No acute osseous injury of the lumbar spine.   Electronically Signed  By: Kathreen Devoid   On: 09/04/2015 15:10    Sacroiliac Joint Imaging: Sacroiliac Joint DG:  Results for orders placed during the hospital encounter of 07/16/16  DG Si Joints   Narrative CLINICAL DATA:  Groin pain.  Prior back surgery.  EXAM: BILATERAL SACROILIAC JOINTS - 3+ VIEW  COMPARISON:  Right hip 07/12/2016.  FINDINGS: Prior lumbosacral and bilateral sacroiliac fusion. Degenerative changes lumbar spine, both SI joints, both hips. No acute bony abnormality identified. No evidence of fracture.  IMPRESSION: Postsurgical and degenerative change. No acute or focal abnormality.   Electronically Signed   By: Marcello Moores  Register   On: 07/16/2016 14:24   Spine Imaging:  CT-Guided Biopsy:  Results for orders placed during the hospital encounter of 12/14/17  CT BIOPSY   Narrative INDICATION: 58 year old with an enlarging suspicious right renal lesion. Imaging findings are concerning for a renal cell carcinoma. Patient is not a good surgical candidate for resection. Plan for image guided microwave ablation of the lesion and biopsy.  EXAM: CT-GUIDED MICROWAVE ABLATION OF RIGHT RENAL LESION  IMAGE GUIDED BIOPSY OF THE RIGHT RENAL LESION WITH CT AND  ULTRASOUND GUIDANCE  COMPARISON:  CT of the abdomen 09/30/2017  MEDICATIONS: Ancef 2 g; The antibiotic was administered in an appropriate time interval prior to needle puncture of the skin.  ANESTHESIA/SEDATION: General - as administered by the Anesthesia department  FLUOROSCOPY TIME:  None  CONTRAST:  75 mL intravenous Isovue 370. 5 mL of Isovue-300 was used for hydro dissection.  COMPLICATIONS: None immediate.  TECHNIQUE: The procedure was explained to the patient. The risks and benefits of the procedure were discussed and the patient's questions were addressed. Informed consent was obtained from the patient. Patient was placed under general anesthesia. Time-out was performed.  Patient was placed prone on the CT scanner. Images through the abdomen were obtained. Right kidney was identified with ultrasound. The right flank was prepped and draped in sterile fashion. Maximal barrier sterile technique was utilized including caps, mask, sterile gowns, sterile gloves, sterile drape, hand hygiene and skin antiseptic. Post contrast images through the right kidney were obtained. The right kidney was also evaluated with ultrasound. Roy needles were directed into the right renal lesion using CT and ultrasound guidance. O'Brien needles removed. 17 gauge NeuWave microwave ablation probes were directed into the right kidney using CT and ultrasound guidance. The first needle was placed along the more caudal aspect of the lesion. Second needle was placed along the cephalad aspect of the lesion. Multiple CT images were used to confirm correct needle placement. The needles were positioned just beyond the lesion.  Using ultrasound guidance, 17 gauge coaxial needle was directed into the renal lesion. CT images were used to confirm placement in the lesion. Two core biopsies were obtained with an 18 gauge device. Specimens placed in formalin.  Attention was directed to  creating hydro dissection around the right kidney. Two 22 gauge needles were placed along the medial and lateral aspect the right kidney. Dilute contrast was injected around the kidney using CT guidance. Greater than 200 mL of dilute contrast was placed around the right kidney.  After the needles were appropriately positioned and the hydro dissection was placed, the right renal lesion was treated for 5 minutes with the microwave device. Needles removed without complication. Follow up CT images were obtained. Bandage placed over the puncture site.  FINDINGS: Enhancing lesion in the right kidney upper pole. Successful core biopsies were obtained of the lesion. Microwave  ablation was performed using 2 microwave probes. Expected gas at the ablation site at the end of the procedure.  IMPRESSION: Successful CT-guided microwave ablation of the right kidney upper pole lesion.  Successful image guided biopsy of the right kidney upper pole lesion.   Electronically Signed   By: Markus Daft M.D.   On: 12/14/2017 14:12    Hip Imaging:  Hip-R DG 2-3 views:  Results for orders placed during the hospital encounter of 07/16/16  DG HIP UNILAT W OR W/O PELVIS 2-3 VIEWS RIGHT   Narrative CLINICAL DATA:  Left hip/ groin pain.  EXAM: DG HIP (WITH OR WITHOUT PELVIS) 2-3V RIGHT  COMPARISON:  CT abdomen and pelvis 02/17/2016  FINDINGS: Sequelae of extensive posterior spinal fusion are again identified extending to the sacrum and with bilateral iliac screws present as well. Right hip joint space width is preserved without significant degenerative changes or erosion identified. Bone mineralization appears normal. A moderate amount of colonic stool is partially visualized.  IMPRESSION: Unremarkable appearance of the right hip.   Electronically Signed   By: Logan Bores M.D.   On: 07/16/2016 14:25    Hip-L DG 2-3 views:  Results for orders placed during the hospital encounter of  07/16/16  DG HIP UNILAT W OR W/O PELVIS 2-3 VIEWS LEFT   Narrative CLINICAL DATA:  Left hip/ groin pain.  EXAM: DG HIP (WITH OR WITHOUT PELVIS) 2-3V LEFT  COMPARISON:  CT abdomen and pelvis 02/17/2016  FINDINGS: Sequelae of extensive posterior spinal fusion are again identified extending to the sacrum and with bilateral iliac screws present as well. Left hip joint space width is preserved without significant degenerative changes or erosion identified. Bone mineralization appears normal. A moderate amount of colonic stool is partially visualized.  IMPRESSION: Unremarkable appearance of the left hip.   Electronically Signed   By: Logan Bores M.D.   On: 07/16/2016 14:24   Complexity Note: Imaging results reviewed. Results shared with Ms. Belland, using [employer].                         Meds   Current Outpatient Medications:  .  cyclobenzaprine (FLEXERIL) 5 MG tablet, Take 1 tablet (5 mg total) by mouth 3 (three) times daily as needed for muscle spasms., Disp: 90 tablet, Rfl: 2 .  escitalopram (LEXAPRO) 10 MG tablet, Take 10 mg by mouth daily. , Disp: , Rfl:  .  furosemide (LASIX) 40 MG tablet, Take 40 mg by mouth 2 (two) times daily. , Disp: , Rfl:  .  HYDROcodone-acetaminophen (NORCO) 5-325 MG tablet, Take 1 tablet by mouth every 6 (six) hours as needed for moderate pain., Disp: 30 tablet, Rfl: 0 .  lactulose (CHRONULAC) 10 GM/15ML solution, Take 10 g by mouth 3 (three) times daily as needed for mild constipation or moderate constipation. , Disp: , Rfl:  .  linagliptin (TRADJENTA) 5 MG TABS tablet, Take 5 mg by mouth daily., Disp: , Rfl:  .  metFORMIN (GLUCOPHAGE) 500 MG tablet, Take 500 mg by mouth 2 (two) times daily. , Disp: , Rfl:  .  Multiple Vitamin (MULTIVITAMIN WITH MINERALS) TABS tablet, Take 1 tablet by mouth daily., Disp: , Rfl:  .  [START ON 05/16/2018] Oxycodone HCl 20 MG TABS, Take 1 tablet (20 mg total) by mouth every 6 (six) hours., Disp: 120 tablet, Rfl:  0 .  Oxycodone HCl 20 MG TABS, Take 1 tablet (20 mg total) by mouth every 6 (six) hours  as needed., Disp: 120 tablet, Rfl: 0 .  OZEMPIC 1 MG/DOSE SOPN, Inject 1 mg into the skin every Tuesday., Disp: , Rfl:  .  pantoprazole (PROTONIX) 40 MG tablet, Take 40 mg by mouth 2 (two) times daily. , Disp: , Rfl: 11 .  pregabalin (LYRICA) 200 MG capsule, Take 200 mg by mouth 2 (two) times daily., Disp: , Rfl:  .  spironolactone (ALDACTONE) 50 MG tablet, Take 100 mg by mouth daily. , Disp: , Rfl:  .  Oxycodone HCl 20 MG TABS, Take 1 tablet (20 mg total) by mouth every 6 (six) hours as needed., Disp: 120 tablet, Rfl: 0  ROS  Constitutional: Denies any fever or chills Gastrointestinal: No reported hemesis, hematochezia, vomiting, or acute GI distress Musculoskeletal: Denies any acute onset joint swelling, redness, loss of ROM, or weakness Neurological: No reported episodes of acute onset apraxia, aphasia, dysarthria, agnosia, amnesia, paralysis, loss of coordination, or loss of consciousness  Allergies  Ms. Dobrowolski has No Known Allergies.  PFSH  Drug: Ms. Weinert  reports no history of drug use. Alcohol:  reports previous alcohol use. Tobacco:  reports that she has been smoking cigarettes. She has a 22.50 pack-year smoking history. She has never used smokeless tobacco. Medical:  has a past medical history of Alcohol abuse, Alcoholic cirrhosis ([place]), Anxiety, Chronic hepatitis C ([place]), Chronic LBP (12/25/2014), Chronic pain syndrome, DDD (degenerative disc disease), lumbar, Diverticulosis, Emphysema lung ([place]), Fibromyalgia, Full dentures, GERD (gastroesophageal reflux disease), Hiatal hernia, History of GI bleed, Hypertension, Iron deficiency anemia due to chronic blood loss, Lumbar facet joint syndrome, Lumbar spondylosis, Lung cancer ([place]) (11/25/2016), MDD (major depressive disorder), Nephrolithiasis, OA (osteoarthritis of spine), Polyarthritis, Polyneuropathy, Restless leg syndrome, Right renal mass, RLS  (restless legs syndrome), SOB (shortness of breath), Type 2 diabetes mellitus ([place]), Urinary frequency, and Vitamin D deficiency disease. Surgical: Ms. Hallgren  has a past surgical history that includes Esophagogastroduodenoscopy (N/A, 04/14/2015); Esophagogastroduodenoscopy (egd) with propofol (N/A, 09/16/2015); Colonoscopy with propofol (N/A, 09/25/2015); Umbilical hernia repair (N/A, 11/06/2015); Esophagogastroduodenoscopy (egd) with propofol (N/A, 01/22/2016); Colonoscopy with propofol (N/A, 04/01/2016); Esophagogastroduodenoscopy (egd) with propofol (N/A, 04/01/2016); Electormagnetic navigation bronchoscopy (N/A, 11/25/2016); IR Radiologist Eval & Mgmt (04/13/2017); IR Radiologist Eval & Mgmt (11/15/2017); Spinal fusion (12/19/2011); Endometrial ablation (2007); Tubal ligation (Bilateral, yrs ago); Tonsillectomy (age 16); Radiology with anesthesia (Right, 12/14/2017); and Foot arthrodesis (Right, 04/06/2018). Family: family history includes [name] in her father; Breast cancer (age of onset: 42) in her sister; Heart disease in her mother; Stroke in her mother.  Constitutional Exam  General appearance: Well nourished, well developed, and well hydrated. In no apparent acute distress Vitals:   04/20/18 1403  BP: 107/64  Pulse: 94  Temp: 98.2 F (36.8 C)  SpO2: 97%  Weight: 168 lb (76.2 kg)  Height: '5\' 10"'$  (1.778 m)   BMI Assessment: Estimated body mass index is 24.11 kg/m as calculated from the following:   Height as of this encounter: '5\' 10"'$  (1.778 m).   Weight as of this encounter: 168 lb (76.2 kg).  BMI interpretation table: BMI level Category Range association with higher incidence of chronic pain  <18 kg/m2 Underweight   18.5-24.9 kg/m2 Ideal body weight   25-29.9 kg/m2 Overweight Increased incidence by 20%  30-34.9 kg/m2 Obese (Class I) Increased incidence by 68%  35-39.9 kg/m2 Severe obesity (Class II) Increased incidence by 136%  >40 kg/m2 Extreme obesity (Class III) Increased incidence by  254%   [name] current BMI Ideal Body weight  Body mass index is 24.11 kg/m.  Ideal body weight: 68.5 kg (151 lb 0.2 oz) Adjusted ideal body weight: 71.6 kg (157 lb 12.9 oz)   BMI Readings from Last 4 Encounters:  04/20/18 24.11 kg/m  04/06/18 24.39 kg/m  02/27/18 24.11 kg/m  02/02/18 24.28 kg/m   Wt Readings from Last 4 Encounters:  04/20/18 168 lb (76.2 kg)  04/06/18 170 lb (77.1 kg)  02/27/18 168 lb (76.2 kg)  02/02/18 169 lb 3.2 oz (76.7 kg)  Psych/Mental status: Alert, oriented x 3 (person, place, & time)       Eyes: PERLA Respiratory: No evidence of acute respiratory distress  Cervical Spine Area Exam  Skin & Axial Inspection: No masses, redness, edema, swelling, or associated skin lesions Alignment: Symmetrical Functional ROM: Unrestricted ROM      Stability: No instability detected Muscle Tone/Strength: Functionally intact. No obvious neuro-muscular anomalies detected. Sensory (Neurological): Unimpaired Palpation: No palpable anomalies              Upper Extremity (UE) Exam    Side: Right upper extremity  Side: Left upper extremity  Skin & Extremity Inspection: Skin color, temperature, and hair growth are WNL. No peripheral edema or cyanosis. No masses, redness, swelling, asymmetry, or associated skin lesions. No contractures.  Skin & Extremity Inspection: Skin color, temperature, and hair growth are WNL. No peripheral edema or cyanosis. No masses, redness, swelling, asymmetry, or associated skin lesions. No contractures.  Functional ROM: Unrestricted ROM          Functional ROM: Unrestricted ROM          Muscle Tone/Strength: Functionally intact. No obvious neuro-muscular anomalies detected.  Muscle Tone/Strength: Functionally intact. No obvious neuro-muscular anomalies detected.  Sensory (Neurological): Unimpaired          Sensory (Neurological): Unimpaired          Palpation: No palpable anomalies              Palpation: No palpable anomalies               Provocative Test(s):  Phalen's test: deferred Tinel's test: deferred Apley's scratch test (touch opposite shoulder):  Action 1 (Across chest): deferred Action 2 (Overhead): deferred Action 3 (LB reach): deferred   Provocative Test(s):  Phalen's test: deferred Tinel's test: deferred Apley's scratch test (touch opposite shoulder):  Action 1 (Across chest): deferred Action 2 (Overhead): deferred Action 3 (LB reach): deferred    Thoracic Spine Area Exam  Skin & Axial Inspection: No masses, redness, or swelling Alignment: Symmetrical Functional ROM: Unrestricted ROM Stability: No instability detected Muscle Tone/Strength: Functionally intact. No obvious neuro-muscular anomalies detected. Sensory (Neurological): Unimpaired Muscle strength & Tone: No palpable anomalies  Lumbar Spine Area Exam  Skin & Axial Inspection: No masses, redness, or swelling Alignment: Symmetrical Functional ROM: Painful ROM affecting primarily the left Stability: No instability detected Muscle Tone/Strength: Increased muscle tone over affected area Sensory (Neurological): Unimpaired Palpation: Complains of area being tender to palpation       Provocative Tests: Hyperextension/rotation test: deferred today due to fusion restriction. Lumbar quadrant test (Kemp's test): deferred today       Lateral bending test: deferred today due to fusion restriction. Patrick's Maneuver: deferred today                    Gait & Posture Assessment  Ambulation: Patient ambulates using a wheel chair Gait: Limited. Surgical boot and half hard cast on right lower leg and foot. Posture: Difficulty standing up straight, due to pain   Lower  Extremity Exam    Side: Right lower extremity  Side: Left lower extremity  Stability: No instability observed          Stability: No instability observed          Skin & Extremity Inspection: surgical dressing (ace and cast)  Skin & Extremity Inspection: Edema, erythema with small nodule  to lateral aspect of shin(old nodule)  Functional ROM: Unrestricted ROM                  Functional ROM: Unrestricted ROM                  Muscle Tone/Strength: Functionally intact. No obvious neuro-muscular anomalies detected.  Muscle Tone/Strength: Functionally intact. No obvious neuro-muscular anomalies detected.  Sensory (Neurological): Unimpaired        Sensory (Neurological): Unimpaired        DTR: Patellar: deferred today Achilles: deferred today Plantar: deferred today  DTR: Patellar: deferred today Achilles: deferred today Plantar: deferred today  Palpation: No palpable anomalies  Palpation: Complains of area being tender to palpation Homans sign positive    Assessment  Primary Diagnosis & Pertinent Problem List: The primary encounter diagnosis was Lumbar spondylosis. Diagnoses of Chronic left sacroiliac joint pain, Swelling of left lower extremity, Chronic lower extremity pain (Left), and Chronic sacroiliac joint pain were also pertinent to this visit.  Status Diagnosis  Controlled Controlled Controlled 1. Lumbar spondylosis   2. Chronic left sacroiliac joint pain   3. Swelling of left lower extremity   4. Chronic lower extremity pain (Left)   5. Chronic sacroiliac joint pain     Problems updated and reviewed during this visit: No problems updated. Plan of Care  Pharmacotherapy (Medications Ordered): Meds ordered this encounter  Medications  . orphenadrine (NORFLEX) injection 60 mg  . ketorolac (TORADOL) injection 60 mg   New Prescriptions   No medications on file   Medications administered today: We administered orphenadrine and ketorolac. Lab-work, procedure(s), and/or referral(s): Orders Placed This Encounter  Procedures  . DG Si Joints  . US Venous Img Lower Unilateral Left   Imaging and/or referral(s): DG SI JOINTS US VENOUS IMG LOWER UNI LEFT Interventional therapies: Planned, scheduled, and/or pending: Not at this time   Considering:   Diagnostic bilateral lumbar facetblock  Possible bilateral lumbar facet RFA Diagnostic left caudal Epiduralsteroid injection + diagnostic epidurogram Possible Racz procedure Diagnostic bilateral sacroiliacjoint injection  Possible bilateral sacroiliac joint RFA Diagnostic left cervical epiduralsteroid injection  Diagnostic bilateral cervical facetblock  Possible bilateral cervical facet RFA   Palliative PRN treatment(s):  Diagnostic bilateral lumbar facet blockunder fluoroscopic guidance and IV sedation  Diagnostic bilateral intra-articular hipjoint injection under fluoroscopic guidance and IV sedation  Diagnostic bilateral sacroiliacjoint block under fluoroscopic guidance and IV sedation      Provider-requested follow-up: No follow-ups on file.  Future Appointments  Date Time Provider [place]  04/21/2018  2:30 PM [facility]  05/05/2018 11:00 AM [facility] None  06/05/2018 10:45 AM Vevelyn Francois, NP [facility] None  08/02/2018 11:00 AM [facility] None  08/03/2018  9:00 AM [facility]  08/03/2018  2:00 PM Sindy Guadeloupe, MD [facility] None   Primary Care Physician: Jodi Marble, MD Location: [facility] Note by: Vevelyn Francois NP Date: 04/20/2018; Time: 9:42 AM  Pain Score Disclaimer: We use the NRS-11 scale. This is a self-reported, subjective measurement of pain severity with only modest accuracy. It is used primarily  to identify changes within a particular patient. It must be understood that outpatient pain scales are significantly less accurate that those used for research, where they can be applied under ideal controlled circumstances with minimal exposure to variables. In reality, the score is likely to be a combination of pain intensity and pain affect, where pain affect describes the degree of emotional arousal or changes in action readiness caused by the sensory experience  of pain. Factors such as social and work situation, setting, emotional state, anxiety levels, expectation, and prior pain experience may influence pain perception and show large inter-individual differences that may also be affected by time variables.  Patient instructions provided during this appointment: Patient Instructions  ____________________________________________________________________________________________  Medication Rules  Purpose: To inform patients, and their family members, of our rules and regulations.  Applies to: All patients receiving prescriptions (written or electronic).  Pharmacy of record: Pharmacy where electronic prescriptions will be sent. If written prescriptions are taken to a different pharmacy, please inform the nursing staff. The pharmacy listed in the electronic medical record should be the one where you would like electronic prescriptions to be sent.  Electronic prescriptions: In compliance with the [place] (STOP) Act of 2017 (Session Lanny Cramp [phone]), effective March 29, 2018, all controlled substances must be electronically prescribed. Calling prescriptions to the pharmacy will cease to exist.  Prescription refills: Only during scheduled appointments. Applies to all prescriptions.  NOTE: The following applies primarily to controlled substances (Opioid* Pain Medications).   [name] responsibilities: 1. Pain Pills: Bring all pain pills to every appointment (except for procedure appointments). 2. Pill Bottles: Bring pills in original pharmacy bottle. Always bring the newest bottle. Bring bottle, even if empty. 3. Medication refills: You are responsible for knowing and keeping track of what medications you take and those you need refilled. The day before your appointment: write a list of all prescriptions that need to be refilled. The day of the appointment: give the list to the admitting nurse. Prescriptions  will be written only during appointments. If you forget a medication: it will not be "Called in", "Faxed", or "electronically sent". You will need to get another appointment to get these prescribed. No early refills. Do not call asking to have your prescription filled early. 4. Prescription Accuracy: You are responsible for carefully inspecting your prescriptions before leaving our office. Have the discharge nurse carefully go over each prescription with you, before taking them home. Make sure that your name is accurately spelled, that your address is correct. Check the name and dose of your medication to make sure it is accurate. Check the number of pills, and the written instructions to make sure they are clear and accurate. Make sure that you are given enough medication to last until your next medication refill appointment. 5. Taking Medication: Take medication as prescribed. When it comes to controlled substances, taking less pills or less frequently than prescribed is permitted and encouraged. Never take more pills than instructed. Never take medication more frequently than prescribed.  6. Inform other Doctors: Always inform, all of your healthcare providers, of all the medications you take. 7. Pain Medication from other Providers: You are not allowed to accept any additional pain medication from any other Doctor or Healthcare provider. There are two exceptions to this rule. (see below) In the event that you require additional pain medication, you are responsible for notifying us, as stated below. 8. Medication Agreement: You are responsible for carefully reading and following our Medication Agreement.  This must be signed before receiving any prescriptions from our practice. Safely store a copy of your signed Agreement. Violations to the Agreement will result in no further prescriptions. (Additional copies of our Medication Agreement are available upon request.) 9. Laws, Rules, & Regulations: All  patients are expected to follow all Federal and [employer], [employer], Rules, [employer]. Ignorance of the Laws does not constitute a valid excuse. The use of any illegal substances is prohibited. 10. Adopted CDC guidelines & recommendations: Target dosing levels will be at or below 60 MME/day. Use of benzodiazepines** is not recommended.  Exceptions: There are only two exceptions to the rule of not receiving pain medications from other Healthcare Providers. 1. Exception #1 (Emergencies): In the event of an emergency (i.e.: accident requiring emergency care), you are allowed to receive additional pain medication. However, you are responsible for: As soon as you are able, call our office [phone], at any time of the day or night, and leave a message stating your name, the date and nature of the emergency, and the name and dose of the medication prescribed. In the event that your call is answered by a member of our staff, make sure to document and save the date, time, and the name of the person that took your information.  2. Exception #2 (Planned Surgery): In the event that you are scheduled by another doctor or dentist to have any type of surgery or procedure, you are allowed (for a period no longer than 30 days), to receive additional pain medication, for the acute post-op pain. However, in this case, you are responsible for picking up a copy of our "Post-op Pain Management for Surgeons" handout, and giving it to your surgeon or dentist. This document is available at our office, and does not require an appointment to obtain it. Simply go to our office during business hours (Monday-Thursday from 8:00 AM to 4:00 PM) (Friday 8:00 AM to 12:00 Noon) or if you have a scheduled appointment with [place], prior to your surgery, and ask for it by name. In addition, you will need to provide [place] with your name, name of your surgeon, type of surgery, and date of procedure or surgery.  *Opioid medications include:  morphine, codeine, oxycodone, oxymorphone, hydrocodone, hydromorphone, meperidine, tramadol, tapentadol, buprenorphine, fentanyl, methadone. **Benzodiazepine medications include: diazepam (Valium), alprazolam (Xanax), clonazepam (Klonopine), lorazepam (Ativan), clorazepate (Tranxene), chlordiazepoxide (Librium), estazolam (Prosom), oxazepam (Serax), temazepam (Restoril), triazolam (Halcion) (Last updated: 05/26/2017) ____________________________________________________________________________________________

## 2018-04-20 NOTE — Telephone Encounter (Signed)
Increased pain and patient would like to come in for shot from [place] to help with pain

## 2018-04-21 ENCOUNTER — Ambulatory Visit
Admission: RE | Admit: 2018-04-21 | Discharge: 2018-04-21 | Disposition: A | Discharge status: Home / Self Care | Payer: Medicare Other | Source: Ambulatory Visit | Attending: Nurse Practitioner | Admitting: Nurse Practitioner

## 2018-04-21 DIAGNOSIS — G8929 Other chronic pain: Secondary | ICD-10-CM | POA: Diagnosis present

## 2018-04-21 DIAGNOSIS — M533 Sacrococcygeal disorders, not elsewhere classified: Secondary | ICD-10-CM | POA: Insufficient documentation

## 2018-04-21 DIAGNOSIS — M7989 Other specified soft tissue disorders: Secondary | ICD-10-CM | POA: Diagnosis present

## 2018-04-21 DIAGNOSIS — M79605 Pain in left leg: Secondary | ICD-10-CM | POA: Insufficient documentation

## 2018-04-24 NOTE — Progress Notes (Signed)
Results were reviewed and found to be: abnormal, but not significant  No acute injury or pathology identified  Review would suggest interventional pain management techniques may be of benefit

## 2018-04-24 NOTE — Telephone Encounter (Signed)
Patient was brought in on Thurs. 04-20-18

## 2018-04-26 ENCOUNTER — Telehealth: Payer: Self-pay | Admitting: Nurse Practitioner

## 2018-04-26 NOTE — Telephone Encounter (Signed)
Pt called and stated that she would like the results of her xray and ultrasound.

## 2018-04-26 NOTE — Telephone Encounter (Signed)
Called to patient.

## 2018-05-05 ENCOUNTER — Inpatient Hospital Stay: Payer: Medicare Other | Attending: Oncology

## 2018-05-09 DIAGNOSIS — D509 Iron deficiency anemia, unspecified: Secondary | ICD-10-CM | POA: Insufficient documentation

## 2018-05-09 DIAGNOSIS — S93314A Dislocation of tarsal joint of right foot, initial encounter: Secondary | ICD-10-CM | POA: Insufficient documentation

## 2018-05-09 DIAGNOSIS — K219 Gastro-esophageal reflux disease without esophagitis: Secondary | ICD-10-CM | POA: Insufficient documentation

## 2018-05-09 DIAGNOSIS — E871 Hypo-osmolality and hyponatremia: Secondary | ICD-10-CM | POA: Insufficient documentation

## 2018-05-09 DIAGNOSIS — Z85528 Personal history of other malignant neoplasm of kidney: Secondary | ICD-10-CM | POA: Insufficient documentation

## 2018-05-09 DIAGNOSIS — E119 Type 2 diabetes mellitus without complications: Secondary | ICD-10-CM | POA: Insufficient documentation

## 2018-05-09 DIAGNOSIS — Z85118 Personal history of other malignant neoplasm of bronchus and lung: Secondary | ICD-10-CM | POA: Insufficient documentation

## 2018-05-09 DIAGNOSIS — I10 Essential (primary) hypertension: Secondary | ICD-10-CM | POA: Insufficient documentation

## 2018-05-09 DIAGNOSIS — R911 Solitary pulmonary nodule: Secondary | ICD-10-CM | POA: Insufficient documentation

## 2018-06-05 ENCOUNTER — Encounter: Payer: Self-pay | Admitting: Nurse Practitioner

## 2018-06-05 ENCOUNTER — Ambulatory Visit: Payer: Medicare Other | Attending: Nurse Practitioner | Admitting: Nurse Practitioner

## 2018-06-05 ENCOUNTER — Other Ambulatory Visit: Payer: Self-pay

## 2018-06-05 VITALS — BP 112/66 | HR 96 | Temp 98.3°F | Ht 70.0 in | Wt 168.0 lb

## 2018-06-05 DIAGNOSIS — M4802 Spinal stenosis, cervical region: Secondary | ICD-10-CM | POA: Diagnosis present

## 2018-06-05 DIAGNOSIS — G894 Chronic pain syndrome: Secondary | ICD-10-CM | POA: Diagnosis present

## 2018-06-05 DIAGNOSIS — M160 Bilateral primary osteoarthritis of hip: Secondary | ICD-10-CM | POA: Diagnosis present

## 2018-06-05 DIAGNOSIS — M5416 Radiculopathy, lumbar region: Secondary | ICD-10-CM | POA: Insufficient documentation

## 2018-06-05 DIAGNOSIS — M533 Sacrococcygeal disorders, not elsewhere classified: Secondary | ICD-10-CM | POA: Insufficient documentation

## 2018-06-05 DIAGNOSIS — G8929 Other chronic pain: Secondary | ICD-10-CM | POA: Diagnosis present

## 2018-06-05 DIAGNOSIS — M47816 Spondylosis without myelopathy or radiculopathy, lumbar region: Secondary | ICD-10-CM | POA: Diagnosis not present

## 2018-06-05 MED ORDER — OXYCODONE HCL 20 MG PO TABS: 1.0000 | ORAL_TABLET | Freq: Four times a day (QID) | ORAL | 0 refills | Status: DC | PRN

## 2018-06-05 MED ORDER — CYCLOBENZAPRINE HCL 5 MG PO TABS: 5.0000 mg | ORAL_TABLET | Freq: Three times a day (TID) | ORAL | 2 refills | Status: DC | PRN

## 2018-06-05 MED ORDER — OXYCODONE HCL 20 MG PO TABS: 1.0000 | ORAL_TABLET | Freq: Four times a day (QID) | ORAL | 0 refills | Status: DC

## 2018-06-05 MED ORDER — ORPHENADRINE CITRATE 30 MG/ML IJ SOLN
60.0000 mg | Freq: Once | INTRAMUSCULAR | Status: AC
  Administered 2018-06-05: 60 mg via INTRAMUSCULAR
  Filled 2018-06-05: qty 2

## 2018-06-05 MED ORDER — KETOROLAC TROMETHAMINE 60 MG/2ML IM SOLN
60.0000 mg | Freq: Once | INTRAMUSCULAR | Status: AC
  Administered 2018-06-05: 60 mg via INTRAMUSCULAR
  Filled 2018-06-05: qty 2

## 2018-06-05 NOTE — Patient Instructions (Signed)
____________________________________________________________________________________________  Medication Rules  Purpose: To inform patients, and their family members, of our rules and regulations.  Applies to: All patients receiving prescriptions (written or electronic).  Pharmacy of record: Pharmacy where electronic prescriptions will be sent. If written prescriptions are taken to a different pharmacy, please inform the nursing staff. The pharmacy listed in the electronic medical record should be the one where you would like electronic prescriptions to be sent.  Electronic prescriptions: In compliance with the [place] Opioid Misuse Prevention (STOP) Act of 2017 (Session Law 2017-74/H243), effective March 29, 2018, all controlled substances must be electronically prescribed. Calling prescriptions to the pharmacy will cease to exist.  Prescription refills: Only during scheduled appointments. Applies to all prescriptions.  NOTE: The following applies primarily to controlled substances (Opioid* Pain Medications).   [name] responsibilities: 1. Pain Pills: Bring all pain pills to every appointment (except for procedure appointments). 2. Pill Bottles: Bring pills in original pharmacy bottle. Always bring the newest bottle. Bring bottle, even if empty. 3. Medication refills: You are responsible for knowing and keeping track of what medications you take and those you need refilled. The day before your appointment: write a list of all prescriptions that need to be refilled. The day of the appointment: give the list to the admitting nurse. Prescriptions will be written only during appointments. No prescriptions will be written on procedure days. If you forget a medication: it will not be "Called in", "Faxed", or "electronically sent". You will need to get another appointment to get these prescribed. No early refills. Do not call asking to have your prescription filled  early. 4. Prescription Accuracy: You are responsible for carefully inspecting your prescriptions before leaving our office. Have the discharge nurse carefully go over each prescription with you, before taking them home. Make sure that your name is accurately spelled, that your address is correct. Check the name and dose of your medication to make sure it is accurate. Check the number of pills, and the written instructions to make sure they are clear and accurate. Make sure that you are given enough medication to last until your next medication refill appointment. 5. Taking Medication: Take medication as prescribed. When it comes to controlled substances, taking less pills or less frequently than prescribed is permitted and encouraged. Never take more pills than instructed. Never take medication more frequently than prescribed.  6. Inform other Doctors: Always inform, all of your healthcare providers, of all the medications you take. 7. Pain Medication from other Providers: You are not allowed to accept any additional pain medication from any other Doctor or Healthcare provider. There are two exceptions to this rule. (see below) In the event that you require additional pain medication, you are responsible for notifying us, as stated below. 8. Medication Agreement: You are responsible for carefully reading and following our Medication Agreement. This must be signed before receiving any prescriptions from our practice. Safely store a copy of your signed Agreement. Violations to the Agreement will result in no further prescriptions. (Additional copies of our Medication Agreement are available upon request.) 9. Laws, Rules, & Regulations: All patients are expected to follow all Federal and State Laws, Statutes, Rules, & Regulations. Ignorance of the Laws does not constitute a valid excuse. The use of any illegal substances is prohibited. 10. Adopted CDC guidelines & recommendations: Target dosing levels will be  at or below 60 MME/day. Use of benzodiazepines** is not recommended.  Exceptions: There are only two exceptions to the rule of not   receiving pain medications from other Healthcare Providers. 1. Exception #1 (Emergencies): In the event of an emergency (i.e.: accident requiring emergency care), you are allowed to receive additional pain medication. However, you are responsible for: As soon as you are able, call our office [phone], at any time of the day or night, and leave a message stating your name, the date and nature of the emergency, and the name and dose of the medication prescribed. In the event that your call is answered by a member of our staff, make sure to document and save the date, time, and the name of the person that took your information.  2. Exception #2 (Planned Surgery): In the event that you are scheduled by another doctor or dentist to have any type of surgery or procedure, you are allowed (for a period no longer than 30 days), to receive additional pain medication, for the acute post-op pain. However, in this case, you are responsible for picking up a copy of our "Post-op Pain Management for Surgeons" handout, and giving it to your surgeon or dentist. This document is available at our office, and does not require an appointment to obtain it. Simply go to our office during business hours (Monday-Thursday from 8:00 AM to 4:00 PM) (Friday 8:00 AM to 12:00 Noon) or if you have a scheduled appointment with us, prior to your surgery, and ask for it by name. In addition, you will need to provide us with your name, name of your surgeon, type of surgery, and date of procedure or surgery.  *Opioid medications include: morphine, codeine, oxycodone, oxymorphone, hydrocodone, hydromorphone, meperidine, tramadol, tapentadol, buprenorphine, fentanyl, methadone. **Benzodiazepine medications include: diazepam (Valium), alprazolam (Xanax), clonazepam (Klonopine), lorazepam (Ativan), clorazepate  (Tranxene), chlordiazepoxide (Librium), estazolam (Prosom), oxazepam (Serax), temazepam (Restoril), triazolam (Halcion) (Last updated: 05/26/2017) ____________________________________________________________________________________________    

## 2018-06-05 NOTE — Progress Notes (Signed)
Nursing Pain Medication Assessment:  Safety precautions to be maintained throughout the outpatient stay will include: orient to surroundings, keep bed in low position, maintain call bell within reach at all times, provide assistance with transfer out of bed and ambulation.  Medication Inspection Compliance: Pill count conducted under aseptic conditions, in front of the patient. Neither the pills nor the bottle was removed from the patient's sight at any time. Once count was completed pills were immediately returned to the patient in their original bottle.  Medication: Oxycodone IR Pill/Patch Count: 69 of 120 pills remain Pill/Patch Appearance: Markings consistent with prescribed medication Bottle Appearance: Standard pharmacy container. Clearly labeled. Filled Date: 2 / 74 / 2020 Last Medication intake:  Today

## 2018-06-05 NOTE — Progress Notes (Signed)
[name] Name: Audrey Garrett  MRN: [id number]  Referring Provider: Jodi Marble, MD  DOB: [birth year]  PCP: Jodi Marble, MD  DOS: 06/05/2018  Note by: Dionisio David, NP  Service setting: Ambulatory outpatient  Specialty: Interventional Pain Management  Location: [facility] (AMB) Pain Management Facility    Patient type: Established   HPI  Reason for Visit: Audrey Garrett is a 58 y.o. year old, female patient, who comes today with a chief complaint of Back Pain Last Appointment: Her last appointment at our practice was on 04/26/2018. I last saw her on 04/26/2018.  Pain Assessment: Today, Audrey Garrett describes the severity of the Chronic pain as a 7 /10. She indicates the location/referral of the pain to be Back Lower/pain radiaties down left leg to foot. Onset was: More than a month ago. The quality of pain is described as Aching, Shooting, Throbbing, Constant. Temporal description, or timing of pain is: Constant. Possible modifying factors: medications. Audrey Garrett  height is '5\' 10"'$  (1.778 m) and weight is 168 lb (76.2 kg). Her temperature is 98.3 F (36.8 C). Her blood pressure is 112/66 and her pulse is 96. Her oxygen saturation is 100%.  She admits that her pain continues. She denies any more falls. She is not weight bearing on her right foot. She is having weakness in her left ankle. She is having increased depression.   Controlled Substance Pharmacotherapy Assessment REMS (Risk Evaluation and Mitigation Strategy)  Analgesic:Oxycodone IR 20 mg every 6 hours  (80 mg/day) MME/day:120 mg/day.  Chauncey Fischer, RN  06/05/2018 10:28 AM  Sign when Signing Visit Nursing Pain Medication Assessment:  Safety precautions to be maintained throughout the outpatient stay will include: orient to surroundings, keep bed in low position, maintain call bell within reach at all times, provide assistance with transfer out of bed and ambulation.  Medication Inspection Compliance: Pill count conducted under  aseptic conditions, in front of the patient. Neither the pills nor the bottle was removed from the patient's sight at any time. Once count was completed pills were immediately returned to the patient in their original bottle.  Medication: Oxycodone IR Pill/Patch Count: 69 of 120 pills remain Pill/Patch Appearance: Markings consistent with prescribed medication Bottle Appearance: Standard pharmacy container. Clearly labeled. Filled Date: 2 / 26 / 2020 Last Medication intake:  Today   Pharmacokinetics: Liberation and absorption (onset of action): WNL Distribution (time to peak effect): WNL Metabolism and excretion (duration of action): WNL         Pharmacodynamics: Desired effects: Analgesia: Audrey Garrett reports >50% benefit. Functional ability: Patient reports that medication allows her to accomplish basic ADLs Clinically meaningful improvement in function (CMIF): Sustained CMIF goals met Perceived effectiveness: Described as relatively effective, allowing for increase in activities of daily living (ADL) Undesirable effects: Side-effects or Adverse reactions: None reported Monitoring: [place] PMP: Online review of the past 62-monthperiod conducted. Compliant with practice rules and regulations Last UDS on record: Summary  Date Value Ref Range Status  12/01/2017 FINAL  Final    Comment:    ==================================================================== TOXASSURE SELECT 13 (MW) ==================================================================== Test                             Result       Flag       Units Drug Present and Declared for Prescription Verification   Oxycodone  1004         EXPECTED   ng/mg creat   Oxymorphone                    3423         EXPECTED   ng/mg creat   Noroxycodone                   6665         EXPECTED   ng/mg creat   Noroxymorphone                 3850         EXPECTED   ng/mg creat    Sources of oxycodone are scheduled prescription  medications.    Oxymorphone, noroxycodone, and noroxymorphone are expected    metabolites of oxycodone. Oxymorphone is also available as a    scheduled prescription medication. ==================================================================== Test                      Result    Flag   Units      Ref Range   Creatinine              26               mg/dL      >=20 ==================================================================== Declared Medications:  The flagging and interpretation on this report are based on the  following declared medications.  Unexpected results may arise from  inaccuracies in the declared medications.  **Note: The testing scope of this panel includes these medications:  Oxycodone  **Note: The testing scope of this panel does not include following  reported medications:  Cyclobenzaprine  Escitalopram (Lexapro)  Furosemide (Lasix)  Lactulose  Metformin  Multivitamin  Pantoprazole (Protonix)  Pregabalin (Lyrica)  Spironolactone (Aldactone)  Vitamin B1 ==================================================================== For clinical consultation, please call [phone]. ====================================================================    UDS interpretation: Compliant          Medication Assessment Form: Reviewed. Patient indicates being compliant with therapy Treatment compliance: Compliant Risk Assessment Profile: Aberrant behavior: See initial evaluations. None observed or detected today Comorbid factors increasing risk of overdose: See initial evaluation. No additional risks detected today Opioid risk tool (ORT):  Opioid Risk  06/05/2018  Alcohol 0  Illegal Drugs 0  Rx Drugs 0  Alcohol 0  Illegal Drugs 0  Rx Drugs 0  Age between 16-45 years  0  History of Preadolescent Sexual Abuse 0  Psychological Disease 0  ADD -  OCD -  Bipolar -  Depression 1  Opioid Risk Tool Scoring 1  Opioid Risk Interpretation Low Risk    ORT Scoring  interpretation table:  Score <3 = Low Risk for SUD  Score between 4-7 = Moderate Risk for SUD  Score >8 = High Risk for Opioid Abuse   Risk of substance use disorder (SUD): Low  Risk Mitigation Strategies:  Patient Counseling: Covered Patient-Prescriber Agreement (PPA): Present and active  Notification to other healthcare providers: Done  Pharmacologic Plan: No change in therapy, at this time.            ROS  Constitutional: Denies any fever or chills Gastrointestinal: No reported hemesis, hematochezia, vomiting, or acute GI distress Musculoskeletal: Denies any acute onset joint swelling, redness, loss of ROM, or weakness Neurological: No reported episodes of acute onset apraxia, aphasia, dysarthria, agnosia, amnesia, paralysis, loss of coordination, or loss of consciousness  Medication Review  Oxycodone HCl, Semaglutide (1 MG/DOSE), cyclobenzaprine,  escitalopram, furosemide, linagliptin, multivitamin with minerals, pantoprazole, pregabalin, and spironolactone  History Review  Allergy: Audrey Garrett has No Known Allergies. Drug: Audrey Garrett  reports no history of drug use. Alcohol:  reports previous alcohol use. Tobacco:  reports that she has been smoking cigarettes. She has a 22.50 pack-year smoking history. She has never used smokeless tobacco. Social: Audrey Garrett  reports that she has been smoking cigarettes. She has a 22.50 pack-year smoking history. She has never used smokeless tobacco. She reports previous alcohol use. She reports that she does not use drugs. Medical:  has a past medical history of Alcohol abuse, Alcoholic cirrhosis ([place]), Anxiety, Chronic hepatitis C ([place]), Chronic LBP (12/25/2014), Chronic pain syndrome, DDD (degenerative disc disease), lumbar, Diverticulosis, Emphysema lung ([place]), Fibromyalgia, Full dentures, GERD (gastroesophageal reflux disease), Hiatal hernia, History of GI bleed, Hypertension, Iron deficiency anemia due to chronic blood loss, Lumbar facet joint  syndrome, Lumbar spondylosis, Lung cancer ([place]) (11/25/2016), MDD (major depressive disorder), Nephrolithiasis, OA (osteoarthritis of spine), Polyarthritis, Polyneuropathy, Restless leg syndrome, Right renal mass, RLS (restless legs syndrome), SOB (shortness of breath), Type 2 diabetes mellitus ([place]), Urinary frequency, and Vitamin D deficiency disease. Surgical: Ms. Gafford  has a past surgical history that includes Esophagogastroduodenoscopy (N/A, 04/14/2015); Esophagogastroduodenoscopy (egd) with propofol (N/A, 09/16/2015); Colonoscopy with propofol (N/A, 09/25/2015); Umbilical hernia repair (N/A, 11/06/2015); Esophagogastroduodenoscopy (egd) with propofol (N/A, 01/22/2016); Colonoscopy with propofol (N/A, 04/01/2016); Esophagogastroduodenoscopy (egd) with propofol (N/A, 04/01/2016); Electormagnetic navigation bronchoscopy (N/A, 11/25/2016); IR Radiologist Eval & Mgmt (04/13/2017); IR Radiologist Eval & Mgmt (11/15/2017); Spinal fusion (12/19/2011); Endometrial ablation (2007); Tubal ligation (Bilateral, yrs ago); Tonsillectomy (age 55); Radiology with anesthesia (Right, 12/14/2017); and Foot arthrodesis (Right, 04/06/2018). Family: family history includes [name] in her father; Breast cancer (age of onset: 29) in her sister; Heart disease in her mother; Stroke in her mother. Problem List: Audrey Garrett has Cervical radicular pain; Failed back surgical syndrome; Lumbar facet syndrome (Bilateral) (L>R); Osteoarthritis of spine with radiculopathy, lumbar region; Musculoskeletal pain; Chronic low back pain (Primary Source of Pain) (Bilateral) (L>R); HCV (hepatitis C virus); At risk for osteopenia; Lumbar spondylosis; Chronic lumbar radicular pain (Secondary source of pain) (Left); Chronic neck pain; Chronic lower extremity pain (Left); Chronic sacroiliac joint pain (Bilateral) (R>L); Chronic upper back pain; Garrett for chronic pain management; Coagulopathy ([place]); Polyneuropathy; Neurogenic pain; S/P thoracolumbar fusion  (T5-L5); Grade 1 Retrolisthesis (4 mm) of C5 over C6 and C6 over C7; Cervical foraminal stenosis (Left C5-6; Bilateral C6-7); Chronic pain syndrome; RLS (restless legs syndrome); Chronic hip pain (Bilateral); Osteoarthritis of hip (Bilateral); Chronic groin pain, left; Chronic left sacroiliac joint pain; and Swelling of left lower extremity on their pertinent problem list.  Lab Review  Kidney Function Lab Results  Component Value Date   BUN 6 02/02/2018   CREATININE 0.57 02/02/2018   GFRAA >60 02/02/2018   GFRNONAA >60 02/02/2018  Liver Function Lab Results  Component Value Date   AST 64 (H) 02/02/2018   ALT 34 02/02/2018   ALBUMIN 3.7 02/02/2018  Note: Above Lab results reviewed.  Imaging Review  DG Si Joints CLINICAL DATA:  Fall 2 weeks ago with leg pain, initial Garrett  EXAM: BILATERAL SACROILIAC JOINTS - 3+ VIEW  COMPARISON:  06/15/2017  FINDINGS: Postsurgical changes are again seen in the lower lumbar spine extending into the sacrum and subsequently into the iliac bones bilaterally. The overall appearance is stable. Fracture of the fixation rods is noted but again stable from previous exam. No abnormality of the pelvic ring is seen. The  sacroiliac joints show mild degenerative change stable from the previous study.  IMPRESSION: Chronic changes without acute abnormality.  Electronically Signed   By: Inez Catalina M.D.   On: 04/21/2018 19:34 US Venous Img Lower Unilateral Left CLINICAL DATA:  Chronic lower extremity pain and edema since fall on 04/06/2018. History of lung and kidney cancer. Evaluate for DVT  EXAM: LEFT LOWER EXTREMITY VENOUS DOPPLER ULTRASOUND  TECHNIQUE: Gray-scale sonography with graded compression, as well as color Doppler and duplex ultrasound were performed to evaluate the lower extremity deep venous systems from the level of the common femoral vein and including the common femoral, femoral, profunda femoral, popliteal and calf veins  including the posterior tibial, peroneal and gastrocnemius veins when visible. The superficial great saphenous vein was also interrogated. Spectral Doppler was utilized to evaluate flow at rest and with distal augmentation maneuvers in the common femoral, femoral and popliteal veins.  COMPARISON:  None.  FINDINGS: Contralateral Common Femoral Vein: Respiratory phasicity is normal and symmetric with the symptomatic side. No evidence of thrombus. Normal compressibility.  Common Femoral Vein: No evidence of thrombus. Normal compressibility, respiratory phasicity and response to augmentation.  Saphenofemoral Junction: No evidence of thrombus. Normal compressibility and flow on color Doppler imaging.  Profunda Femoral Vein: No evidence of thrombus. Normal compressibility and flow on color Doppler imaging.  Femoral Vein: No evidence of thrombus. Normal compressibility, respiratory phasicity and response to augmentation.  Popliteal Vein: No evidence of thrombus. Normal compressibility, respiratory phasicity and response to augmentation.  Calf Veins: No evidence of thrombus. Normal compressibility and flow on color Doppler imaging.  Superficial Great Saphenous Vein: No evidence of thrombus. Normal compressibility.  Venous Reflux:  None.  Other Findings:  None.  IMPRESSION: No evidence of DVT within the left lower extremity.  Electronically Signed   By: Sandi Mariscal M.D.   On: 04/21/2018 15:17 Note: Reviewed        Physical Exam  General appearance: Well nourished, well developed, and well hydrated. In no apparent acute distress Mental status: Alert, oriented x 3 (person, place, & time)       Respiratory: No evidence of acute respiratory distress Eyes: PERLA Vitals: BP 112/66   Pulse 96   Temp 98.3 F (36.8 C)   Ht '5\' 10"'$  (1.778 m)   Wt 168 lb (76.2 kg)   SpO2 100%   BMI 24.11 kg/m  BMI: Estimated body mass index is 24.11 kg/m as calculated from the following:    Height as of this Garrett: '5\' 10"'$  (1.778 m).   Weight as of this Garrett: 168 lb (76.2 kg). Ideal: Ideal body weight: 68.5 kg (151 lb 0.2 oz) Adjusted ideal body weight: 71.6 kg (157 lb 12.9 oz) Lumbar Spine Area Exam  Skin & Axial Inspection: Well healed scar from previous spine surgery detected Alignment: Scoliosis detected Functional ROM: Unrestricted ROM       Stability: No instability detected Muscle Tone/Strength: Functionally intact. No obvious neuro-muscular anomalies detected. Sensory (Neurological): Unimpaired Palpation: Complains of area being tender to palpation       Provocative Tests: Hyperextension/rotation test: deferred today       Lumbar quadrant test (Kemp's test): deferred today       Lateral bending test: deferred today       Patrick's Maneuver: deferred today                    Gait & Posture Assessment  Ambulation: Patient ambulates using a wheel chair Gait: Limited. Using assistive  device to ambulate Posture: Antalgic  Lower Extremity Exam    Side: Right lower extremity  Side: Left lower extremity  Stability: No instability observed          Stability: No instability observed          Skin & Extremity Inspection: Evidence of prior arthroplastic surgery cast to right foot  Skin & Extremity Inspection: Skin color, temperature, and hair growth are WNL. No peripheral edema or cyanosis. No masses, redness, swelling, asymmetry, or associated skin lesions. No contractures.  Functional ROM: Unrestricted ROM                  Functional ROM: Unrestricted ROM                  Muscle Tone/Strength: Functionally intact. No obvious neuro-muscular anomalies detected.  Muscle Tone/Strength: Functionally intact. No obvious neuro-muscular anomalies detected.  Sensory (Neurological): Referred pain pattern        Sensory (Neurological): Dermatomal pain pattern bottom & lateral aspect of foot (S1)      Palpation: No palpable anomalies  Palpation: No palpable anomalies    Assessment   Status Diagnosis  Persistent Persistent Persistent 1. Chronic lumbar radicular pain (Secondary source of pain) (Left)   2. Lumbar spondylosis   3. Chronic sacroiliac joint pain (Bilateral) (R>L)   4. Osteoarthritis of hip (Bilateral)   5. Cervical foraminal stenosis (Left C5-6; Bilateral C6-7)   6. Chronic pain syndrome      Updated Problems: Problem  Dislocation of Tarsal Joint of Right Foot  Gastroesophageal Reflux Disease Without Esophagitis  H/O Malignant Neoplasm of Lung  H/O Renal Cell Cancer  Hyponatremia  Type 2 Diabetes Mellitus ([facility])  Anemia, Iron Deficiency  Essential Hypertension  Lung Nodule    Plan of Care  Pharmacotherapy (Medications Ordered): Meds ordered this Garrett  Medications  . cyclobenzaprine (FLEXERIL) 5 MG tablet    Sig: Take 1 tablet (5 mg total) by mouth 3 (three) times daily as needed for muscle spasms.    Dispense:  90 tablet    Refill:  2    Do not place this medication, or any other prescription from our practice, on "Automatic Refill". Patient may have prescription filled one day early if pharmacy is closed on scheduled refill date.    Order Specific Question:   Supervising Provider    Answer:   [name] [phone]  . Oxycodone HCl 20 MG TABS    Sig: Take 1 tablet (20 mg total) by mouth every 6 (six) hours for 30 days.    Dispense:  120 tablet    Refill:  0    Do not place this medication, or any other prescription from our practice, on "Automatic Refill". Patient may have prescription filled one day early if pharmacy is closed on scheduled refill date.    Order Specific Question:   Supervising Provider    Answer:   [name] [phone]  . Oxycodone HCl 20 MG TABS    Sig: Take 1 tablet (20 mg total) by mouth every 6 (six) hours as needed for up to 30 days.    Dispense:  120 tablet    Refill:  0    Do not place this medication, or any other prescription from our practice, on "Automatic Refill". Patient  may have prescription filled one day early if pharmacy is closed on scheduled refill date.    Order Specific Question:   Supervising Provider    Answer:   [name] [phone]  .  Oxycodone HCl 20 MG TABS    Sig: Take 1 tablet (20 mg total) by mouth every 6 (six) hours as needed for up to 30 days.    Dispense:  120 tablet    Refill:  0    Do not place this medication, or any other prescription from our practice, on "Automatic Refill". Patient may have prescription filled one day early if pharmacy is closed on scheduled refill date.    Order Specific Question:   Supervising Provider    Answer:   [name] [phone]  . orphenadrine (NORFLEX) injection 60 mg  . ketorolac (TORADOL) injection 60 mg   Administered today: We administered orphenadrine and ketorolac.  Orders:  No orders of the defined types were placed in this Garrett.  Follow-up plan:   Return in about 3 months (around 09/05/2018) for MedMgmt.   Interventional options: Considering:  Diagnostic bilateral lumbar facetblock  Possible bilateral lumbar facet RFA Diagnostic left caudal Epiduralsteroid injection + diagnostic epidurogram Possible Racz procedure Diagnostic bilateral sacroiliacjoint injection  Possible bilateral sacroiliac joint RFA Diagnostic left cervical epiduralsteroid injection  Diagnostic bilateral cervical facetblock  Possible bilateral cervical facet RFA   Palliative PRN treatment(s):  Diagnostic bilateral lumbar facet blockunder fluoroscopic guidance and IV sedation  Diagnostic bilateral intra-articular hipjoint injection under fluoroscopic guidance and IV sedation  Diagnostic bilateral sacroiliacjoint block under fluoroscopic guidance and IV sedation     Note by: Dionisio David, NP Date: 06/05/2018; Time: 12:11 PM

## 2018-06-27 DIAGNOSIS — M14672 Charcot's joint, left ankle and foot: Secondary | ICD-10-CM | POA: Insufficient documentation

## 2018-08-01 ENCOUNTER — Ambulatory Visit: Payer: Medicare Other

## 2018-08-02 ENCOUNTER — Other Ambulatory Visit: Payer: Medicare Other

## 2018-08-03 ENCOUNTER — Ambulatory Visit: Payer: Medicare Other | Admitting: Oncology

## 2018-08-03 ENCOUNTER — Ambulatory Visit: Payer: Medicare Other

## 2018-09-01 ENCOUNTER — Encounter: Payer: Self-pay | Admitting: Pain Medicine

## 2018-09-03 NOTE — Progress Notes (Signed)
Pain Management Virtual Encounter Note - Virtual Visit via Telephone Telehealth (real-time audio visits between healthcare provider and patient).   [name] Phone No. & Preferred Pharmacy:  [phone] (home); [phone] (mobile); (Preferred) [phone] [email or web address]  [id number] Audrey Garrett, [facility] [address] [place] [address] Phone: [phone] Fax: [phone]    Pre-screening note:  Our staff contacted Audrey Garrett and offered her an "in person", "face-to-face" appointment versus a telephone encounter. She indicated preferring the telephone encounter, at this time.   Reason for Virtual Visit: COVID-19*  Social distancing based on CDC and AMA recommendations.   I contacted Audrey Garrett on 09/04/2018 at 12:10 PM via telephone.      I clearly identified myself as Gaspar Cola, MD. I verified that I was speaking with the correct person using two identifiers (Name: Audrey Garrett, and date of birth: 02/04/1961).  Advanced Informed Consent I sought verbal advanced consent from Audrey Garrett for virtual visit interactions. I informed Ms. Henandez of possible security and privacy concerns, risks, and limitations associated with providing "not-in-person" medical evaluation and management services. I also informed Ms. Massar of the availability of "in-person" appointments. Finally, I informed her that there would be a charge for the virtual visit and that she could be  personally, fully or partially, financially responsible for it. Audrey Garrett expressed understanding and agreed to proceed.   Historic Elements   [name] is a 58 y.o. year old, female patient evaluated today after her last encounter by our practice on 06/05/2018. Audrey Garrett  has a past medical history of Alcohol abuse, Alcoholic cirrhosis ([place]), Anxiety, Chronic hepatitis C ([place]), Chronic LBP (12/25/2014), Chronic pain syndrome, DDD (degenerative disc disease), lumbar,  Diverticulosis, Emphysema lung ([place]), Fibromyalgia, Full dentures, GERD (gastroesophageal reflux disease), Hiatal hernia, History of GI bleed, Hypertension, Iron deficiency anemia due to chronic blood loss, Lumbar facet joint syndrome, Lumbar spondylosis, Lung cancer ([place]) (11/25/2016), MDD (major depressive disorder), Nephrolithiasis, OA (osteoarthritis of spine), Polyarthritis, Polyneuropathy, Restless leg syndrome, Right renal mass, RLS (restless legs syndrome), SOB (shortness of breath), Type 2 diabetes mellitus ([place]), Urinary frequency, and Vitamin D deficiency disease. She also  has a past surgical history that includes Esophagogastroduodenoscopy (N/A, 04/14/2015); Esophagogastroduodenoscopy (egd) with propofol (N/A, 09/16/2015); Colonoscopy with propofol (N/A, 09/25/2015); Umbilical hernia repair (N/A, 11/06/2015); Esophagogastroduodenoscopy (egd) with propofol (N/A, 01/22/2016); Colonoscopy with propofol (N/A, 04/01/2016); Esophagogastroduodenoscopy (egd) with propofol (N/A, 04/01/2016); Electormagnetic navigation bronchoscopy (N/A, 11/25/2016); IR Radiologist Eval & Mgmt (04/13/2017); IR Radiologist Eval & Mgmt (11/15/2017); Spinal fusion (12/19/2011); Endometrial ablation (2007); Tubal ligation (Bilateral, yrs ago); Tonsillectomy (age 53); Radiology with anesthesia (Right, 12/14/2017); and Foot arthrodesis (Right, 04/06/2018). Audrey Garrett has a current medication list which includes the following prescription(s): cyclobenzaprine, escitalopram, furosemide, linagliptin, multivitamin with minerals, oxycodone hcl, oxycodone hcl, oxycodone hcl, ozempic (1 mg/dose), pantoprazole, pregabalin, and spironolactone. She  reports that she has been smoking cigarettes. She has a 22.50 pack-year smoking history. She has never used smokeless tobacco. She reports previous alcohol use. She reports that she does not use drugs. Audrey Garrett has No Known Allergies.   HPI  Today, she is being contacted for medication management.  The patient  indicates doing well with her medications with no apparent side effects or complications.  Her pain seems to be under control with the current regimen.  Pharmacotherapy Assessment  Analgesic: Oxycodone IR 20 mg every 6 hours  (80 mg/day) MME/day:120 mg/day.   Monitoring: Pharmacotherapy: No side-effects or adverse reactions reported.  [place] PMP: PDMP reviewed during this encounter.       Compliance: No problems identified. Effectiveness: Clinically acceptable. Plan: Refer to "POC".  Pertinent Labs   SAFETY SCREENING Profile Lab Results  Component Value Date   MRSAPCR NEGATIVE 01/20/2016   Renal Function Lab Results  Component Value Date   BUN 6 02/02/2018   CREATININE 0.57 02/02/2018   GFRAA >60 02/02/2018   GFRNONAA >60 02/02/2018   Hepatic Function Lab Results  Component Value Date   AST 64 (H) 02/02/2018   ALT 34 02/02/2018   ALBUMIN 3.7 02/02/2018   UDS Summary  Date Value Ref Range Status  12/01/2017 FINAL  Final    Comment:    ==================================================================== TOXASSURE SELECT 13 (MW) ==================================================================== Test                             Result       Flag       Units Drug Present and Declared for Prescription Verification   Oxycodone                      1004         EXPECTED   ng/mg creat   Oxymorphone                    3423         EXPECTED   ng/mg creat   Noroxycodone                   6665         EXPECTED   ng/mg creat   Noroxymorphone                 3850         EXPECTED   ng/mg creat    Sources of oxycodone are scheduled prescription medications.    Oxymorphone, noroxycodone, and noroxymorphone are expected    metabolites of oxycodone. Oxymorphone is also available as a    scheduled prescription medication. ==================================================================== Test                      Result    Flag   Units      Ref Range   Creatinine              26                mg/dL      >=20 ==================================================================== Declared Medications:  The flagging and interpretation on this report are based on the  following declared medications.  Unexpected results may arise from  inaccuracies in the declared medications.  **Note: The testing scope of this panel includes these medications:  Oxycodone  **Note: The testing scope of this panel does not include following  reported medications:  Cyclobenzaprine  Escitalopram (Lexapro)  Furosemide (Lasix)  Lactulose  Metformin  Multivitamin  Pantoprazole (Protonix)  Pregabalin (Lyrica)  Spironolactone (Aldactone)  Vitamin B1 ==================================================================== For clinical consultation, please call [phone]. ====================================================================    Note: Above Lab results reviewed.  Recent imaging  DG Si Joints CLINICAL DATA:  Fall 2 weeks ago with leg pain, initial encounter  EXAM: BILATERAL SACROILIAC JOINTS - 3+ VIEW  COMPARISON:  06/15/2017  FINDINGS: Postsurgical changes are again seen in the lower lumbar spine extending into the sacrum and subsequently into the iliac bones bilaterally. The overall appearance is stable. Fracture of the fixation rods  is noted but again stable from previous exam. No abnormality of the pelvic ring is seen. The sacroiliac joints show mild degenerative change stable from the previous study.  IMPRESSION: Chronic changes without acute abnormality.  Electronically Signed   By: Inez Catalina M.D.   On: 04/21/2018 19:34 US Venous Img Lower Unilateral Left CLINICAL DATA:  Chronic lower extremity pain and edema since fall on 04/06/2018. History of lung and kidney cancer. Evaluate for DVT  EXAM: LEFT LOWER EXTREMITY VENOUS DOPPLER ULTRASOUND  TECHNIQUE: Gray-scale sonography with graded compression, as well as color Doppler and duplex ultrasound were  performed to evaluate the lower extremity deep venous systems from the level of the common femoral vein and including the common femoral, femoral, profunda femoral, popliteal and calf veins including the posterior tibial, peroneal and gastrocnemius veins when visible. The superficial great saphenous vein was also interrogated. Spectral Doppler was utilized to evaluate flow at rest and with distal augmentation maneuvers in the common femoral, femoral and popliteal veins.  COMPARISON:  None.  FINDINGS: Contralateral Common Femoral Vein: Respiratory phasicity is normal and symmetric with the symptomatic side. No evidence of thrombus. Normal compressibility.  Common Femoral Vein: No evidence of thrombus. Normal compressibility, respiratory phasicity and response to augmentation.  Saphenofemoral Junction: No evidence of thrombus. Normal compressibility and flow on color Doppler imaging.  Profunda Femoral Vein: No evidence of thrombus. Normal compressibility and flow on color Doppler imaging.  Femoral Vein: No evidence of thrombus. Normal compressibility, respiratory phasicity and response to augmentation.  Popliteal Vein: No evidence of thrombus. Normal compressibility, respiratory phasicity and response to augmentation.  Calf Veins: No evidence of thrombus. Normal compressibility and flow on color Doppler imaging.  Superficial Great Saphenous Vein: No evidence of thrombus. Normal compressibility.  Venous Reflux:  None.  Other Findings:  None.  IMPRESSION: No evidence of DVT within the left lower extremity.  Electronically Signed   By: Sandi Mariscal M.D.   On: 04/21/2018 15:17  Assessment  The primary encounter diagnosis was Chronic pain syndrome. Diagnoses of Chronic low back pain (Primary Source of Pain) (Bilateral) (L>R), Failed back surgical syndrome, Chronic lumbar radicular pain (Secondary source of pain) (Left), Chronic neck pain, Grade 1 Retrolisthesis (4 mm) of C5  over C6 and C6 over C7, and Chronic musculoskeletal pain were also pertinent to this visit.  Plan of Care  I have discontinued Yarima B. Hillenburg's Oxycodone HCl and Oxycodone HCl. I have also changed her Oxycodone HCl. Additionally, I am having her start on Oxycodone HCl and Oxycodone HCl. Lastly, I am having her maintain her furosemide, pantoprazole, spironolactone, escitalopram, Ozempic (1 MG/DOSE), linagliptin, pregabalin, multivitamin with minerals, and cyclobenzaprine.  Pharmacotherapy (Medications Ordered): Meds ordered this encounter  Medications  . Oxycodone HCl 20 MG TABS    Sig: Take 1 tablet (20 mg total) by mouth every 6 (six) hours for 30 days. Must last 30 days    Dispense:  120 tablet    Refill:  0    Chronic Pain: STOP Act - Not applicable. Fill 1 day early if closed on scheduled refill date. Do not fill until: 09/13/2018. To last until: 10/13/2018. Instruct to avoid benzodiazepines within 8 hours of opioid.  . cyclobenzaprine (FLEXERIL) 5 MG tablet    Sig: Take 1 tablet (5 mg total) by mouth 3 (three) times daily as needed for muscle spasms.    Dispense:  90 tablet    Refill:  2    Fill one day early if pharmacy is closed on  scheduled refill date. May substitute for generic if available.  . Oxycodone HCl 20 MG TABS    Sig: Take 1 tablet (20 mg total) by mouth every 6 (six) hours for 30 days. Must last 30 days    Dispense:  120 tablet    Refill:  0    Chronic Pain: STOP Act - Not applicable. Fill 1 day early if closed on scheduled refill date. Do not fill until: 10/13/2018. To last until: 11/12/2018. Instruct to avoid benzodiazepines within 8 hours of opioid.  . Oxycodone HCl 20 MG TABS    Sig: Take 1 tablet (20 mg total) by mouth every 6 (six) hours for 30 days. Must last 30 days    Dispense:  120 tablet    Refill:  0    Chronic Pain: STOP Act - Not applicable. Fill 1 day early if closed on scheduled refill date. Do not fill until: 11/12/2018. To last until: 12/12/2018. Instruct to  avoid benzodiazepines within 8 hours of opioid.   Orders:  No orders of the defined types were placed in this encounter.  Follow-up plan:   Return in about 3 months (around 12/06/2018) for (Virtual), E/M, (Med-Mgmt).    I discussed the assessment and treatment plan with the patient. The patient was provided an opportunity to ask questions and all were answered. The patient agreed with the plan and demonstrated an understanding of the instructions.  Patient advised to call back or seek an in-person evaluation if the symptoms or condition worsens.  Total duration of non-face-to-face encounter: 12 minutes.  Note by: Gaspar Cola, MD Date: 09/04/2018; Time: 12:34 PM  Note: This dictation was prepared with Dragon dictation. Any transcriptional errors that may result from this process are unintentional.  Disclaimer:  * Given the special circumstances of the COVID-19 pandemic, the federal government has announced that the Office for Civil Rights (OCR) will exercise its enforcement discretion and will not impose penalties on physicians using telehealth in the event of noncompliance with regulatory requirements under the [employer] (HIPAA) in connection with the good faith provision of telehealth during the NVBTY-60 national public health emergency. ([place])

## 2018-09-04 ENCOUNTER — Other Ambulatory Visit: Payer: Self-pay

## 2018-09-04 ENCOUNTER — Encounter: Payer: Medicare Other | Admitting: Nurse Practitioner

## 2018-09-04 ENCOUNTER — Ambulatory Visit: Payer: Medicare Other | Attending: Nurse Practitioner | Admitting: Pain Medicine

## 2018-09-04 DIAGNOSIS — G894 Chronic pain syndrome: Secondary | ICD-10-CM | POA: Diagnosis not present

## 2018-09-04 DIAGNOSIS — M7918 Myalgia, other site: Secondary | ICD-10-CM

## 2018-09-04 DIAGNOSIS — M4310 Spondylolisthesis, site unspecified: Secondary | ICD-10-CM

## 2018-09-04 DIAGNOSIS — M961 Postlaminectomy syndrome, not elsewhere classified: Secondary | ICD-10-CM | POA: Diagnosis not present

## 2018-09-04 DIAGNOSIS — M5442 Lumbago with sciatica, left side: Secondary | ICD-10-CM

## 2018-09-04 DIAGNOSIS — M5416 Radiculopathy, lumbar region: Secondary | ICD-10-CM | POA: Diagnosis not present

## 2018-09-04 DIAGNOSIS — M542 Cervicalgia: Secondary | ICD-10-CM

## 2018-09-04 DIAGNOSIS — G8929 Other chronic pain: Secondary | ICD-10-CM

## 2018-09-04 MED ORDER — CYCLOBENZAPRINE HCL 5 MG PO TABS: 5.0000 mg | ORAL_TABLET | Freq: Three times a day (TID) | ORAL | 2 refills | Status: DC | PRN

## 2018-09-04 MED ORDER — OXYCODONE HCL 20 MG PO TABS: 1.0000 | ORAL_TABLET | Freq: Four times a day (QID) | ORAL | 0 refills | Status: DC

## 2018-10-03 ENCOUNTER — Ambulatory Visit: Payer: Medicare Other

## 2018-10-03 ENCOUNTER — Other Ambulatory Visit: Payer: Self-pay | Admitting: *Deleted

## 2018-10-03 ENCOUNTER — Other Ambulatory Visit: Payer: Self-pay

## 2018-10-03 ENCOUNTER — Inpatient Hospital Stay: Payer: Medicare Other

## 2018-10-03 DIAGNOSIS — D509 Iron deficiency anemia, unspecified: Secondary | ICD-10-CM

## 2018-10-04 ENCOUNTER — Ambulatory Visit
Admission: RE | Admit: 2018-10-04 | Discharge: 2018-10-04 | Disposition: A | Discharge status: Home / Self Care | Payer: Medicare Other | Source: Ambulatory Visit | Attending: Oncology | Admitting: Oncology

## 2018-10-04 ENCOUNTER — Inpatient Hospital Stay: Payer: Medicare Other | Attending: Oncology

## 2018-10-04 ENCOUNTER — Other Ambulatory Visit: Payer: Self-pay

## 2018-10-04 DIAGNOSIS — K8020 Calculus of gallbladder without cholecystitis without obstruction: Secondary | ICD-10-CM | POA: Insufficient documentation

## 2018-10-04 DIAGNOSIS — R918 Other nonspecific abnormal finding of lung field: Secondary | ICD-10-CM

## 2018-10-04 DIAGNOSIS — M79605 Pain in left leg: Secondary | ICD-10-CM | POA: Diagnosis not present

## 2018-10-04 DIAGNOSIS — M2550 Pain in unspecified joint: Secondary | ICD-10-CM | POA: Insufficient documentation

## 2018-10-04 DIAGNOSIS — K7460 Unspecified cirrhosis of liver: Secondary | ICD-10-CM | POA: Diagnosis not present

## 2018-10-04 DIAGNOSIS — B182 Chronic viral hepatitis C: Secondary | ICD-10-CM | POA: Insufficient documentation

## 2018-10-04 DIAGNOSIS — K259 Gastric ulcer, unspecified as acute or chronic, without hemorrhage or perforation: Secondary | ICD-10-CM | POA: Insufficient documentation

## 2018-10-04 DIAGNOSIS — F17210 Nicotine dependence, cigarettes, uncomplicated: Secondary | ICD-10-CM | POA: Insufficient documentation

## 2018-10-04 DIAGNOSIS — K219 Gastro-esophageal reflux disease without esophagitis: Secondary | ICD-10-CM | POA: Diagnosis not present

## 2018-10-04 DIAGNOSIS — R5383 Other fatigue: Secondary | ICD-10-CM | POA: Insufficient documentation

## 2018-10-04 DIAGNOSIS — C649 Malignant neoplasm of unspecified kidney, except renal pelvis: Secondary | ICD-10-CM | POA: Insufficient documentation

## 2018-10-04 DIAGNOSIS — M79604 Pain in right leg: Secondary | ICD-10-CM | POA: Diagnosis not present

## 2018-10-04 DIAGNOSIS — Z79899 Other long term (current) drug therapy: Secondary | ICD-10-CM | POA: Diagnosis not present

## 2018-10-04 DIAGNOSIS — K5900 Constipation, unspecified: Secondary | ICD-10-CM | POA: Insufficient documentation

## 2018-10-04 DIAGNOSIS — Z8249 Family history of ischemic heart disease and other diseases of the circulatory system: Secondary | ICD-10-CM | POA: Diagnosis not present

## 2018-10-04 DIAGNOSIS — D509 Iron deficiency anemia, unspecified: Secondary | ICD-10-CM | POA: Insufficient documentation

## 2018-10-04 DIAGNOSIS — C3431 Malignant neoplasm of lower lobe, right bronchus or lung: Secondary | ICD-10-CM | POA: Insufficient documentation

## 2018-10-04 DIAGNOSIS — Z823 Family history of stroke: Secondary | ICD-10-CM | POA: Diagnosis not present

## 2018-10-04 DIAGNOSIS — I700 Atherosclerosis of aorta: Secondary | ICD-10-CM | POA: Insufficient documentation

## 2018-10-04 DIAGNOSIS — G894 Chronic pain syndrome: Secondary | ICD-10-CM | POA: Diagnosis not present

## 2018-10-04 DIAGNOSIS — Z803 Family history of malignant neoplasm of breast: Secondary | ICD-10-CM | POA: Insufficient documentation

## 2018-10-04 DIAGNOSIS — R911 Solitary pulmonary nodule: Secondary | ICD-10-CM | POA: Diagnosis not present

## 2018-10-04 LAB — IRON AND TIBC
Iron: 25 ug/dL — ABNORMAL LOW (ref 28–170)
Saturation Ratios: 6 % — ABNORMAL LOW (ref 10.4–31.8)
TIBC: 389 ug/dL (ref 250–450)
UIBC: 364 ug/dL

## 2018-10-04 LAB — POCT I-STAT CREATININE: Creatinine, Ser: 0.6 mg/dL (ref 0.44–1.00)

## 2018-10-04 LAB — CBC WITH DIFFERENTIAL/PLATELET
Abs Immature Granulocytes: 0.04 10*3/uL (ref 0.00–0.07)
Basophils Absolute: 0 10*3/uL (ref 0.0–0.1)
Basophils Relative: 0 %
Eosinophils Absolute: 0.3 10*3/uL (ref 0.0–0.5)
Eosinophils Relative: 3 %
HCT: 28.6 % — ABNORMAL LOW (ref 36.0–46.0)
Hemoglobin: 8.6 g/dL — ABNORMAL LOW (ref 12.0–15.0)
Immature Granulocytes: 0 %
Lymphocytes Relative: 18 %
Lymphs Abs: 1.6 10*3/uL (ref 0.7–4.0)
MCH: 26.2 pg (ref 26.0–34.0)
MCHC: 30.1 g/dL (ref 30.0–36.0)
MCV: 87.2 fL (ref 80.0–100.0)
Monocytes Absolute: 1.1 10*3/uL — ABNORMAL HIGH (ref 0.1–1.0)
Monocytes Relative: 11 %
Neutro Abs: 6.3 10*3/uL (ref 1.7–7.7)
Neutrophils Relative %: 68 %
Platelets: 179 10*3/uL (ref 150–400)
RBC: 3.28 MIL/uL — ABNORMAL LOW (ref 3.87–5.11)
RDW: 19.2 % — ABNORMAL HIGH (ref 11.5–15.5)
WBC: 9.3 10*3/uL (ref 4.0–10.5)
nRBC: 0 % (ref 0.0–0.2)

## 2018-10-04 LAB — FERRITIN: Ferritin: 12 ng/mL (ref 11–307)

## 2018-10-04 MED ORDER — IOHEXOL 300 MG/ML  SOLN
100.0000 mL | Freq: Once | INTRAMUSCULAR | Status: AC | PRN
  Administered 2018-10-04: 100 mL via INTRAVENOUS

## 2018-10-05 ENCOUNTER — Other Ambulatory Visit: Payer: Self-pay

## 2018-10-05 ENCOUNTER — Encounter: Payer: Self-pay | Admitting: Oncology

## 2018-10-05 ENCOUNTER — Inpatient Hospital Stay (HOSPITAL_BASED_OUTPATIENT_CLINIC_OR_DEPARTMENT_OTHER): Payer: Medicare Other | Admitting: Oncology

## 2018-10-05 ENCOUNTER — Inpatient Hospital Stay: Payer: Medicare Other

## 2018-10-05 VITALS — BP 104/71 | HR 86 | Temp 98.1°F | Resp 18

## 2018-10-05 VITALS — BP 104/71 | HR 96 | Temp 98.7°F | Resp 18 | Wt 179.2 lb

## 2018-10-05 DIAGNOSIS — B182 Chronic viral hepatitis C: Secondary | ICD-10-CM

## 2018-10-05 DIAGNOSIS — M79604 Pain in right leg: Secondary | ICD-10-CM

## 2018-10-05 DIAGNOSIS — D509 Iron deficiency anemia, unspecified: Secondary | ICD-10-CM

## 2018-10-05 DIAGNOSIS — Z803 Family history of malignant neoplasm of breast: Secondary | ICD-10-CM

## 2018-10-05 DIAGNOSIS — R911 Solitary pulmonary nodule: Secondary | ICD-10-CM | POA: Diagnosis not present

## 2018-10-05 DIAGNOSIS — C649 Malignant neoplasm of unspecified kidney, except renal pelvis: Secondary | ICD-10-CM

## 2018-10-05 DIAGNOSIS — G894 Chronic pain syndrome: Secondary | ICD-10-CM

## 2018-10-05 DIAGNOSIS — Z8249 Family history of ischemic heart disease and other diseases of the circulatory system: Secondary | ICD-10-CM

## 2018-10-05 DIAGNOSIS — I700 Atherosclerosis of aorta: Secondary | ICD-10-CM

## 2018-10-05 DIAGNOSIS — K219 Gastro-esophageal reflux disease without esophagitis: Secondary | ICD-10-CM

## 2018-10-05 DIAGNOSIS — K7460 Unspecified cirrhosis of liver: Secondary | ICD-10-CM

## 2018-10-05 DIAGNOSIS — C3431 Malignant neoplasm of lower lobe, right bronchus or lung: Secondary | ICD-10-CM | POA: Diagnosis not present

## 2018-10-05 DIAGNOSIS — Z823 Family history of stroke: Secondary | ICD-10-CM

## 2018-10-05 DIAGNOSIS — Z79899 Other long term (current) drug therapy: Secondary | ICD-10-CM

## 2018-10-05 DIAGNOSIS — M2550 Pain in unspecified joint: Secondary | ICD-10-CM

## 2018-10-05 DIAGNOSIS — R918 Other nonspecific abnormal finding of lung field: Secondary | ICD-10-CM

## 2018-10-05 DIAGNOSIS — F17210 Nicotine dependence, cigarettes, uncomplicated: Secondary | ICD-10-CM

## 2018-10-05 DIAGNOSIS — M79605 Pain in left leg: Secondary | ICD-10-CM

## 2018-10-05 DIAGNOSIS — D500 Iron deficiency anemia secondary to blood loss (chronic): Secondary | ICD-10-CM

## 2018-10-05 DIAGNOSIS — K259 Gastric ulcer, unspecified as acute or chronic, without hemorrhage or perforation: Secondary | ICD-10-CM

## 2018-10-05 DIAGNOSIS — R5383 Other fatigue: Secondary | ICD-10-CM

## 2018-10-05 DIAGNOSIS — K8020 Calculus of gallbladder without cholecystitis without obstruction: Secondary | ICD-10-CM

## 2018-10-05 MED ORDER — SODIUM CHLORIDE 0.9 % IV SOLN
Freq: Once | INTRAVENOUS | Status: AC
  Administered 2018-10-05: 11:00:00 via INTRAVENOUS
  Filled 2018-10-05: qty 250

## 2018-10-05 MED ORDER — SODIUM CHLORIDE 0.9 % IV SOLN
510.0000 mg | Freq: Once | INTRAVENOUS | Status: AC
  Administered 2018-10-05: 510 mg via INTRAVENOUS
  Filled 2018-10-05: qty 17

## 2018-10-05 NOTE — Progress Notes (Signed)
Hematology/Oncology Consult note [facility]  [phone] [phone]  Patient Care Team: Jodi Marble, MD as PCP - General (Internal Medicine)   Name of the patient: Audrey Garrett  [id number]  01-14-1961   Date of visit: 10/05/18  Diagnosis- 1. Iron deficiency anemia  2. Renal cell carcinoma  3. Stage I lung cancer [id number]  4. Bilateral lung nodule- ? Low grade multifocal adenocarcinoma  Chief complaint/ Reason for visit-discuss results of CT scan and routine follow-up of iron deficiency anemia  Heme/Onc history: Patient is a 58 year old female with a h/o liver cirrhosis and is currently receiving Harvoni for hepatitis C. GI evaluation is as follows:  Upper EGD on 04/01/16 showed multiple dispersed diminutive erosions were found in the gastric antrum. There were no stigmata of recent bleeding. One healing cratered gastric ulcer with no stigmata of bleeding was found in the prepyloric region of the stomach. The lesion was 2 mm in largest dimension. Colonoscopy on 04/01/16 showed multiple small-mouthed diverticula were found in the sigmoid colon and descending colon. A single small localized angioectasia with typical arborization was found in the proximal ascending colon. Coagulation for tissue destruction using argon plasma at 0.5 liters/minute and 20 watts was successful.  She has received significant blood transfusions recently since March 2017. Her last blood transfusion was on 03/17/16. She had 2 units of blood She received 4 doses of IV Venofer  In Jan 2018 she received blood trasnsfusion followed by ferraheme.   During her visit in February 2018 her H&H was 10.3/ 31.6. IV iron was held off and she was supposed to get cbc checked in1 month. Patient missed that appointment and became progressively iron deficient again when her hb dropped to 7.9. She received 1 unit of blood transfusion  Patient continued to feel fatigued despite  improvement in her anemia. Given her strong h/o smoking CT lung cancer screenign was obtained which showed: IMPRESSION: 11.6 mm spiculated right lung nodule just anterior to the major fissure with associated fissure all retraction. Lung-RADS Category 4A, suspicious. Follow up low-dose chest CT without contrast in 3 months (please use the following order, "CT CHEST LCS NODULE FOLLOW-UP W/O CM") is recommended. Alternatively, PET may be considered when there is a solid component 44mm or larger.  Other bilateral pulmonary nodules evident. Attention on follow-up.   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 She did not wish to pursue surgery  She sees DR. Erlene Quan for her renal mass. She is not a candidate for surgery and underwent cryoablation  Interval history-patient was diagnosed with Charcots arthropathy.  Her right lower extremity is in a brace.  She feels fatigued.  ECOG PS- 2 Pain scale- 7- b/l leg pain Opioid associated constipation- no  Review of systems- Review of Systems  Constitutional: Positive for malaise/fatigue. Negative for chills, fever and weight loss.  HENT: Negative for congestion, ear discharge and nosebleeds.   Eyes: Negative for blurred vision.  Respiratory: Negative for cough, hemoptysis, sputum production, shortness of breath and wheezing.   Cardiovascular: Negative for chest pain, palpitations, orthopnea and claudication.  Gastrointestinal: Negative for abdominal pain, blood in stool, constipation, diarrhea, heartburn, melena, nausea and vomiting.  Genitourinary: Negative for dysuria, flank pain, frequency, hematuria and urgency.  Musculoskeletal: Positive for joint pain. Negative for back pain and myalgias.       Bilateral leg pain  Skin:  Negative for rash.  Neurological: Negative for dizziness, tingling, focal weakness, seizures,  weakness and headaches.  Endo/Heme/Allergies: Does not bruise/bleed easily.  Psychiatric/Behavioral: Negative for depression and suicidal ideas. The patient does not have insomnia.       No Known Allergies   Past Medical History:  Diagnosis Date   Alcohol abuse    Alcoholic cirrhosis ([place])    GI-- dr skulskie/  hx ascites 2017    Anxiety    Chronic hepatitis C ([place])    treated w/ harvoni(2018)   Chronic LBP 12/25/2014   Overview:  Post extensive surgery    Chronic pain syndrome    neck , upper back, post extensive spinal surgery   DDD (degenerative disc disease), lumbar    Diverticulosis    Emphysema lung ([place])    Fibromyalgia    Full dentures    partials, upper and lower   GERD (gastroesophageal reflux disease)    gastroparesis   Hiatal hernia    History of GI bleed    06/ 2017  ulcers and gastric erosions, transfused x4/  10/ 2017 unknown source/  01/ 2018  transfused x2 units and IV Iron    Hypertension    Iron deficiency anemia due to chronic blood loss    hemotologist-- dr Alvy Bimler   Lumbar facet joint syndrome    Lumbar spondylosis    Lung cancer ([place]) 11/25/2016   right upper lobe nodule , Stage I,  treatment SBRT   MDD (major depressive disorder)    Nephrolithiasis    per CT 09-30-2017 right side nonobstructive   OA (osteoarthritis of spine)    Polyarthritis    Polyneuropathy    Restless leg syndrome    Right renal mass    RLS (restless legs syndrome)    SOB (shortness of breath)    Type 2 diabetes mellitus ([place])    followed by pcp   Urinary frequency    Vitamin D deficiency disease      Past Surgical History:  Procedure Laterality Date   COLONOSCOPY WITH PROPOFOL N/A 09/25/2015   Procedure: COLONOSCOPY WITH PROPOFOL;  Surgeon: Lollie Sails, MD;  Location: [facility] ENDOSCOPY;  Service: Endoscopy;  Laterality: N/A;   COLONOSCOPY WITH PROPOFOL N/A 04/01/2016   Procedure: COLONOSCOPY WITH PROPOFOL;  Surgeon: Lollie Sails, MD;  Location: [facility] ENDOSCOPY;  Service: Endoscopy;  Laterality: N/A;   ELECTROMAGNETIC NAVIGATION BROCHOSCOPY N/A 11/25/2016   Procedure: ELECTROMAGNETIC NAVIGATION BRONCHOSCOPY;  Surgeon: Flora Lipps, MD;  Location: [facility] ORS;  Service: Cardiopulmonary;  Laterality: N/A;   ENDOMETRIAL ABLATION  2007   ESOPHAGOGASTRODUODENOSCOPY N/A 04/14/2015   Procedure: ESOPHAGOGASTRODUODENOSCOPY (EGD);  Surgeon: Lollie Sails, MD;  Location: [facility] ENDOSCOPY;  Service: Endoscopy;  Laterality: N/A;   ESOPHAGOGASTRODUODENOSCOPY (EGD) WITH PROPOFOL N/A 09/16/2015   Procedure: ESOPHAGOGASTRODUODENOSCOPY (EGD) WITH PROPOFOL;  Surgeon: Lollie Sails, MD;  Location: [facility] ENDOSCOPY;  Service: Endoscopy;  Laterality: N/A;   ESOPHAGOGASTRODUODENOSCOPY (EGD) WITH PROPOFOL N/A 01/22/2016   Procedure: ESOPHAGOGASTRODUODENOSCOPY (EGD) WITH PROPOFOL;  Surgeon: [name], MD;  Location: [place];  Service: Endoscopy;  Laterality: N/A;   ESOPHAGOGASTRODUODENOSCOPY (EGD) WITH PROPOFOL N/A 04/01/2016   Procedure: ESOPHAGOGASTRODUODENOSCOPY (EGD) WITH PROPOFOL;  Surgeon: Lollie Sails, MD;  Location: [facility] ENDOSCOPY;  Service: Endoscopy;  Laterality: N/A;   FOOT ARTHRODESIS Right 04/06/2018   Procedure: ARTHRODESIS - HALLUX/IPJOINT;  Surgeon: [name], DPM;  Location: [place];  Service: Podiatry;  Laterality: Right;  HAMMERLOCK IMPLANTS PARAGON 4-0 SCREW LMA LOCAL Diabetic - oral meds  IR RADIOLOGIST EVAL & MGMT  04/13/2017   IR RADIOLOGIST EVAL & MGMT  11/15/2017   RADIOLOGY WITH ANESTHESIA Right 12/14/2017   Procedure: CT MICROWAVE THERMAL ABLATION AND BIOPSY WITH ANESTHESIA;  Surgeon: Markus Daft, MD;  Location: [facility] ORS;  Service: Anesthesiology;  Laterality: Right;   SPINAL FUSION  12/19/2011   T5 to sacrum (rod, screws)   TONSILLECTOMY  age 47   TUBAL LIGATION Bilateral yrs ago   [place] N/A 11/06/2015   Procedure: HERNIA REPAIR UMBILICAL ADULT;   Surgeon: [name], MD;  Location: [facility] ORS;  Service: General;  Laterality: N/A;    Social History   Socioeconomic History   Marital status: Married    Spouse name: Not on file   Number of children: Not on file   Years of education: Not on file   Highest education level: Not on file  Occupational History   Not on file  Social Needs   Financial resource strain: Not on file   Food insecurity    Worry: Not on file    Inability: Not on file   Transportation needs    Medical: Not on file    Non-medical: Not on file  Tobacco Use   Smoking status: Current Every Day Smoker    Packs/day: 0.50    Years: 45.00    Pack years: 22.50    Types: Cigarettes   Smokeless tobacco: Never Used   Tobacco comment: 12-05-2017 2ppd until 2017 down to 1/2 ppd  Substance and Sexual Activity   Alcohol use: Not Currently    Alcohol/week: 0.0 standard drinks    Comment: did drink 12 pack beer per week (7 oz each)   Drug use: No   Sexual activity: Not on file  Lifestyle   Physical activity    Days per week: Not on file    Minutes per session: Not on file   Stress: Not on file  Relationships   Social connections    Talks on phone: Not on file    Gets together: Not on file    Attends religious service: Not on file    Active member of club or organization: Not on file    Attends meetings of clubs or organizations: Not on file    Relationship status: Not on file   Intimate partner violence    Fear of current or ex partner: Not on file    Emotionally abused: Not on file    Physically abused: Not on file    Forced sexual activity: Not on file  Other Topics Concern   Not on file  Social History Narrative   Lives at home with husband    Family History  Problem Relation Age of Onset   Stroke Mother    Heart disease Mother    [name] Father    Breast cancer Sister 59   Kidney disease Neg Hx    Bladder Cancer Neg Hx    Kidney cancer Neg Hx    Prostate  cancer Neg Hx      Current Outpatient Medications:    cyclobenzaprine (FLEXERIL) 5 MG tablet, Take 1 tablet (5 mg total) by mouth 3 (three) times daily as needed for muscle spasms., Disp: 90 tablet, Rfl: 2   escitalopram (LEXAPRO) 10 MG tablet, Take 10 mg by mouth daily. , Disp: , Rfl:    furosemide (LASIX) 40 MG tablet, Take 40 mg by mouth 2 (two) times daily. , Disp: , Rfl:    linagliptin (TRADJENTA) 5  MG TABS tablet, Take 5 mg by mouth daily., Disp: , Rfl:    Multiple Vitamin (MULTIVITAMIN WITH MINERALS) TABS tablet, Take 1 tablet by mouth daily., Disp: , Rfl:    Oxycodone HCl 20 MG TABS, Take 1 tablet (20 mg total) by mouth every 6 (six) hours for 30 days. Must last 30 days, Disp: 120 tablet, Rfl: 0   OZEMPIC 1 MG/DOSE SOPN, Inject 1 mg into the skin every Tuesday., Disp: , Rfl:    pantoprazole (PROTONIX) 40 MG tablet, Take 40 mg by mouth 2 (two) times daily. , Disp: , Rfl: 11   pregabalin (LYRICA) 200 MG capsule, Take 200 mg by mouth 2 (two) times daily., Disp: , Rfl:    spironolactone (ALDACTONE) 50 MG tablet, Take 100 mg by mouth daily. , Disp: , Rfl:    [START ON 10/13/2018] Oxycodone HCl 20 MG TABS, Take 1 tablet (20 mg total) by mouth every 6 (six) hours for 30 days. Must last 30 days (Patient not taking: Reported on 10/05/2018), Disp: 120 tablet, Rfl: 0   [START ON 11/12/2018] Oxycodone HCl 20 MG TABS, Take 1 tablet (20 mg total) by mouth every 6 (six) hours for 30 days. Must last 30 days (Patient not taking: Reported on 10/05/2018), Disp: 120 tablet, Rfl: 0  Physical exam:  Vitals:   10/05/18 0954  BP: 104/71  Pulse: 96  Resp: 18  Temp: 98.7 F (37.1 C)  TempSrc: Tympanic  SpO2: 96%  Weight: 179 lb 3.2 oz (81.3 kg)   Physical Exam Constitutional:      Comments: Fatigued and ambulates with a walker.  HENT:     Head: Normocephalic and atraumatic.  Eyes:     Pupils: Pupils are equal, round, and reactive to light.  Neck:     Musculoskeletal: Normal range of motion.   Cardiovascular:     Rate and Rhythm: Normal rate and regular rhythm.     Heart sounds: Normal heart sounds.  Pulmonary:     Effort: Pulmonary effort is normal.     Breath sounds: Normal breath sounds.  Abdominal:     General: Bowel sounds are normal.     Palpations: Abdomen is soft.  Musculoskeletal:     Comments: Brace in place over right lower extremity.  Skin:    General: Skin is warm and dry.  Neurological:     Mental Status: She is alert and oriented to person, place, and time.      CMP Latest Ref Rng & Units 10/04/2018  Glucose 70 - 99 mg/dL -  BUN 6 - 20 mg/dL -  Creatinine 0.44 - 1.00 mg/dL 0.60  Sodium 135 - 145 mmol/L -  Potassium 3.5 - 5.1 mmol/L -  Chloride 98 - 111 mmol/L -  CO2 22 - 32 mmol/L -  Calcium 8.9 - 10.3 mg/dL -  Total Protein 6.5 - 8.1 g/dL -  Total Bilirubin 0.3 - 1.2 mg/dL -  Alkaline Phos 38 - 126 U/L -  AST 15 - 41 U/L -  ALT 0 - 44 U/L -   CBC Latest Ref Rng & Units 10/04/2018  WBC 4.0 - 10.5 K/uL 9.3  Hemoglobin 12.0 - 15.0 g/dL 8.6(L)  Hematocrit 36.0 - 46.0 % 28.6(L)  Platelets 150 - 400 K/uL 179    No images are attached to the encounter.  Ct Abdomen Pelvis W Wo Contrast  Result Date: 10/04/2018 CLINICAL DATA:  Renal cell carcinoma.  History of lung cancer. EXAM: CT CHEST, ABDOMEN, AND PELVIS WITHOUT AND  WITH CONTRAST TECHNIQUE: Multidetector CT imaging of the chest, abdomen and pelvis was performed following the standard protocol before and during bolus administration of intravenous contrast. CONTRAST:  182mL OMNIPAQUE IOHEXOL 300 MG/ML  SOLN COMPARISON:  Limb 152019 FINDINGS: CT CHEST FINDINGS Cardiovascular: The heart size is normal. No substantial pericardial effusion. Mediastinum/Nodes: No mediastinal lymphadenopathy. There is no hilar lymphadenopathy. There is no axillary lymphadenopathy. The esophagus has normal imaging features. Lungs/Pleura: The architectural distortion with scarring in the right parahilar lung is similar to prior.  Numerous bilateral ground-glass opacities are noted in the lungs bilaterally, progressive in the interval. Patchy ground-glass opacity in the lingula and posterior right middle lobe (image 90/series 10) is new in the interval. Numerous other small 5-10 mm ground-glass nodules scattered through both lungs are similar to prior with some progression of nodularity in the upper lungs. Cystic 2.3 x 4.0 cm lesion posterior right upper lobe is mildly progressive since prior when it was measured at 3.6 x 2.3 cm no pleural effusion. Musculoskeletal: No worrisome lytic or sclerotic osseous abnormality. CT ABDOMEN PELVIS FINDINGS Hepatobiliary: Nodular liver contour compatible with reported history of cirrhosis. Tiny calcified gallstones evident. No intrahepatic or extrahepatic biliary dilation. Pancreas: No focal mass lesion. No dilatation of the main duct. No intraparenchymal cyst. No peripancreatic edema. Spleen: No splenomegaly. No focal mass lesion. Adrenals/Urinary Tract: No adrenal nodule or mass. Ablation defect in the upper pole right kidney has decreased in size in the interval measuring 2.8 x 1.8 cm today compared to 3.1 x 2.2 cm when I remeasure in a similar fashion on the prior study. Tiny hypoattenuating lesions in the left kidney are similar to prior. No evidence for hydroureter. The urinary bladder appears normal for the degree of distention. Stomach/Bowel: Stomach is unremarkable. No gastric wall thickening. No evidence of outlet obstruction. Duodenum is normally positioned as is the ligament of Treitz. No small bowel wall thickening. No small bowel dilatation. The terminal ileum is normal. The appendix is normal. No gross colonic mass. No colonic wall thickening. Vascular/Lymphatic: There is abdominal aortic atherosclerosis without aneurysm. There is no gastrohepatic or hepatoduodenal ligament lymphadenopathy. No intraperitoneal or retroperitoneal lymphadenopathy. No pelvic sidewall lymphadenopathy.  Reproductive: 3.6 cm benign appearing right adnexal cyst noted. Other: No intraperitoneal free fluid. Musculoskeletal: Status post extensive thoracolumbar fusion. No worrisome lytic or sclerotic osseous abnormality. IMPRESSION: 1. Similar appearance of post treatment changes in the parahilar right mid lung. 2. Interval progression of nodular sub solid nodularity and patchy ground-glass attenuation in the lungs bilaterally. As on previous study, low-grade multifocal adenocarcinoma not excluded. The more patchy areas of new ground-glass attenuation today could represent superimposed areas of infectious/inflammatory alveolitis. 3. Interval evolution of the ablation defect upper pole right kidney without evidence for local recurrence. No metastatic disease in the abdomen or pelvis. 4. Cirrhosis. 5. Cholelithiasis. 6. 3.6 cm benign appearing right adnexal cyst. Based on patient age and lesion size, follow-up ultrasound recommended to further evaluate. This recommendation follows ACR consensus guidelines: White Paper of the ACR Incidental Findings Committee II on Adnexal Findings. J Am Coll Radiol 2013:10:675-681. 7.  Aortic Atherosclerois (ICD10-170.0) Electronically Signed   By: Misty Stanley M.D.   On: 10/04/2018 11:43   Ct Chest W Contrast  Result Date: 10/04/2018 CLINICAL DATA:  Renal cell carcinoma.  History of lung cancer. EXAM: CT CHEST, ABDOMEN, AND PELVIS WITHOUT AND WITH CONTRAST TECHNIQUE: Multidetector CT imaging of the chest, abdomen and pelvis was performed following the standard protocol before and during bolus administration of  intravenous contrast. CONTRAST:  131mL OMNIPAQUE IOHEXOL 300 MG/ML  SOLN COMPARISON:  Limb 152019 FINDINGS: CT CHEST FINDINGS Cardiovascular: The heart size is normal. No substantial pericardial effusion. Mediastinum/Nodes: No mediastinal lymphadenopathy. There is no hilar lymphadenopathy. There is no axillary lymphadenopathy. The esophagus has normal imaging features.  Lungs/Pleura: The architectural distortion with scarring in the right parahilar lung is similar to prior. Numerous bilateral ground-glass opacities are noted in the lungs bilaterally, progressive in the interval. Patchy ground-glass opacity in the lingula and posterior right middle lobe (image 90/series 10) is new in the interval. Numerous other small 5-10 mm ground-glass nodules scattered through both lungs are similar to prior with some progression of nodularity in the upper lungs. Cystic 2.3 x 4.0 cm lesion posterior right upper lobe is mildly progressive since prior when it was measured at 3.6 x 2.3 cm no pleural effusion. Musculoskeletal: No worrisome lytic or sclerotic osseous abnormality. CT ABDOMEN PELVIS FINDINGS Hepatobiliary: Nodular liver contour compatible with reported history of cirrhosis. Tiny calcified gallstones evident. No intrahepatic or extrahepatic biliary dilation. Pancreas: No focal mass lesion. No dilatation of the main duct. No intraparenchymal cyst. No peripancreatic edema. Spleen: No splenomegaly. No focal mass lesion. Adrenals/Urinary Tract: No adrenal nodule or mass. Ablation defect in the upper pole right kidney has decreased in size in the interval measuring 2.8 x 1.8 cm today compared to 3.1 x 2.2 cm when I remeasure in a similar fashion on the prior study. Tiny hypoattenuating lesions in the left kidney are similar to prior. No evidence for hydroureter. The urinary bladder appears normal for the degree of distention. Stomach/Bowel: Stomach is unremarkable. No gastric wall thickening. No evidence of outlet obstruction. Duodenum is normally positioned as is the ligament of Treitz. No small bowel wall thickening. No small bowel dilatation. The terminal ileum is normal. The appendix is normal. No gross colonic mass. No colonic wall thickening. Vascular/Lymphatic: There is abdominal aortic atherosclerosis without aneurysm. There is no gastrohepatic or hepatoduodenal ligament  lymphadenopathy. No intraperitoneal or retroperitoneal lymphadenopathy. No pelvic sidewall lymphadenopathy. Reproductive: 3.6 cm benign appearing right adnexal cyst noted. Other: No intraperitoneal free fluid. Musculoskeletal: Status post extensive thoracolumbar fusion. No worrisome lytic or sclerotic osseous abnormality. IMPRESSION: 1. Similar appearance of post treatment changes in the parahilar right mid lung. 2. Interval progression of nodular sub solid nodularity and patchy ground-glass attenuation in the lungs bilaterally. As on previous study, low-grade multifocal adenocarcinoma not excluded. The more patchy areas of new ground-glass attenuation today could represent superimposed areas of infectious/inflammatory alveolitis. 3. Interval evolution of the ablation defect upper pole right kidney without evidence for local recurrence. No metastatic disease in the abdomen or pelvis. 4. Cirrhosis. 5. Cholelithiasis. 6. 3.6 cm benign appearing right adnexal cyst. Based on patient age and lesion size, follow-up ultrasound recommended to further evaluate. This recommendation follows ACR consensus guidelines: White Paper of the ACR Incidental Findings Committee II on Adnexal Findings. J Am Coll Radiol 2013:10:675-681. 7.  Aortic Atherosclerois (ICD10-170.0) Electronically Signed   By: Misty Stanley M.D.   On: 10/04/2018 11:43     Assessment and plan- Patient is a 57 y.o. female with following issues:  1 .  Iron deficiency anemia: Her hemoglobin is drifted down to 8.6 from a baseline of 12.  Iron studies indicate evidence of iron deficiency.  We will proceed with 2 doses of Feraheme which she has received before and tolerated it well.  She will also need to touch base with [facility] clinic GI given her repeated episodes  of iron deficiency.  2.  History of renal mass concerning for renal cell carcinoma: Status post cryoablation.  I have reviewed CT chest abdomen pelvis images independently and there is ablation  defect seen in the upper pole of the right kidney and there is no evidence of recurrence.  Continue to monitor  3.  Bilateral lung nodules: Patient previously had a right upper lobe lung nodule which was biopsy-proven lung cancer and she underwent SBRT for this.  He has had persistent bilateral groundglass opacities which have been monitored and have been concerning for low-grade adenocarcinoma.  These have progressed in the interim.  I will discuss her case at tumor board next week.  Patient absolutely does not wish to go through chemotherapy if that is an option.  It may be worthwhile getting a repeat bronchoscopy to assess these groundglass opacities at this time and immunotherapy could be potentially considered if this is biopsy-proven lung cancer.  I will level video visit with patient and her husband next week after tumor board discussion   Visit Diagnosis 1. Iron deficiency anemia, unspecified iron deficiency anemia type   2. Lung nodules      Dr. Randa Evens, MD, MPH [facility] [phone] 10/05/2018 1:34 PM

## 2018-10-05 NOTE — Progress Notes (Signed)
Pt declines to stay for full 30 minute observation period. Pt tolerated infusion well. Pt and VS stable at discharge.

## 2018-10-05 NOTE — Progress Notes (Signed)
Denies any changes, continues to have pain in back and feet.

## 2018-10-06 ENCOUNTER — Ambulatory Visit
Admission: RE | Admit: 2018-10-06 | Discharge: 2018-10-06 | Disposition: A | Discharge status: Home / Self Care | Payer: Medicare Other | Source: Ambulatory Visit | Attending: Gastroenterology | Admitting: Gastroenterology

## 2018-10-06 ENCOUNTER — Other Ambulatory Visit: Payer: Self-pay | Admitting: Gastroenterology

## 2018-10-06 DIAGNOSIS — K5900 Constipation, unspecified: Secondary | ICD-10-CM

## 2018-10-11 ENCOUNTER — Other Ambulatory Visit: Payer: Self-pay

## 2018-10-12 ENCOUNTER — Other Ambulatory Visit: Payer: Self-pay

## 2018-10-12 ENCOUNTER — Other Ambulatory Visit: Payer: Medicare Other

## 2018-10-12 ENCOUNTER — Inpatient Hospital Stay: Payer: Medicare Other

## 2018-10-12 VITALS — BP 110/73 | HR 84 | Temp 99.5°F | Resp 18

## 2018-10-12 DIAGNOSIS — D500 Iron deficiency anemia secondary to blood loss (chronic): Secondary | ICD-10-CM

## 2018-10-12 DIAGNOSIS — D509 Iron deficiency anemia, unspecified: Secondary | ICD-10-CM | POA: Diagnosis not present

## 2018-10-12 MED ORDER — SODIUM CHLORIDE 0.9 % IV SOLN
510.0000 mg | Freq: Once | INTRAVENOUS | Status: AC
  Administered 2018-10-12: 510 mg via INTRAVENOUS
  Filled 2018-10-12: qty 17

## 2018-10-12 MED ORDER — SODIUM CHLORIDE 0.9 % IV SOLN
Freq: Once | INTRAVENOUS | Status: AC
  Administered 2018-10-12: 11:00:00 via INTRAVENOUS
  Filled 2018-10-12: qty 250

## 2018-10-12 NOTE — Progress Notes (Signed)
Tumor Board Documentation  [name] was presented by Dr Janese Banks at our Tumor Board on 10/12/2018, which included representatives from medical oncology, radiation oncology, pathology, radiology, surgical, surgical oncology, navigation, internal medicine, pharmacy, pulmonology, palliative care, research.  Audrey Garrett currently presents as a current patient, for discussion with history of the following treatments: active survellience.  Additionally, we reviewed previous medical and familial history, history of present illness, and recent lab results along with all available histopathologic and imaging studies. The tumor board considered available treatment options and made the following recommendations: Active surveillance CT in 3 months, if nodes are still pronounced, then send for Bronchoscopy  The following procedures/referrals were also placed: No orders of the defined types were placed in this encounter.   Clinical Trial Status: not discussed   Staging used: Not Applicable  National site-specific guidelines   were discussed with respect to the case.  Tumor board is a meeting of clinicians from various specialty areas who evaluate and discuss patients for whom a multidisciplinary approach is being considered. Final determinations in the plan of care are those of the provider(s). The responsibility for follow up of recommendations given during tumor board is that of the provider.   Today's extended care, comprehensive team conference, Cynthie was not present for the discussion and was not examined.   Multidisciplinary Tumor Board is a multidisciplinary case peer review process.  Decisions discussed in the Multidisciplinary Tumor Board reflect the opinions of the specialists present at the conference without having examined the patient.  Ultimately, treatment and diagnostic decisions rest with the primary provider(s) and the patient.

## 2018-10-13 ENCOUNTER — Encounter: Payer: Self-pay | Admitting: Oncology

## 2018-10-13 ENCOUNTER — Inpatient Hospital Stay (HOSPITAL_BASED_OUTPATIENT_CLINIC_OR_DEPARTMENT_OTHER): Payer: Medicare Other | Admitting: Oncology

## 2018-10-13 DIAGNOSIS — F17210 Nicotine dependence, cigarettes, uncomplicated: Secondary | ICD-10-CM

## 2018-10-13 DIAGNOSIS — Z79899 Other long term (current) drug therapy: Secondary | ICD-10-CM

## 2018-10-13 DIAGNOSIS — D508 Other iron deficiency anemias: Secondary | ICD-10-CM | POA: Diagnosis not present

## 2018-10-13 DIAGNOSIS — D500 Iron deficiency anemia secondary to blood loss (chronic): Secondary | ICD-10-CM

## 2018-10-13 DIAGNOSIS — R918 Other nonspecific abnormal finding of lung field: Secondary | ICD-10-CM

## 2018-10-13 NOTE — Progress Notes (Signed)
I connected with Audrey Garrett on 10/13/18 at  9:00 AM EDT by video enabled telemedicine visit and verified that I am speaking with the correct person using two identifiers.   I discussed the limitations, risks, security and privacy concerns of performing an evaluation and management service by telemedicine and the availability of in-person appointments. I also discussed with the patient that there may be a patient responsible charge related to this service. The patient expressed understanding and agreed to proceed.  Other persons participating in the visit and their role in the encounter:  None  There were problems during connection and we had to switch to a telephone visit  [name] location:  home Provider's location:  work  Risk analyst Complaint: Discuss tumor board consensus and further management  History of present illness: Patient is a 58 year old female with a h/o liver cirrhosis and is currently receiving Harvoni for hepatitis C. GI evaluation is as follows:  Upper EGD on 04/01/16 showed multiple dispersed diminutive erosions were found in the gastric antrum. There were no stigmata of recent bleeding. One healing cratered gastric ulcer with no stigmata of bleeding was found in the prepyloric region of the stomach. The lesion was 2 mm in largest dimension. Colonoscopy on 04/01/16 showed multiple small-mouthed diverticula were found in the sigmoid colon and descending colon. A single small localized angioectasia with typical arborization was found in the proximal ascending colon. Coagulation for tissue destruction using argon plasma at 0.5 liters/minute and 20 watts was successful.  She has received significant blood transfusions recently since March 2017. Her last blood transfusion was on 03/17/16. She had 2 units of blood She received 4 doses of IV Venofer  In Jan 2018 she received blood trasnsfusion followed by ferraheme.   During her visit in February 2018 her H&H was 10.3/ 31.6. IV iron  was held off and she was supposed to get cbc checked in1 month. Patient missed that appointment and became progressively iron deficient again when her hb dropped to 7.9. She received 1 unit of blood transfusion  Patient continued to feel fatigued despite improvement in her anemia. Given her strong h/o smoking CT lung cancer screenign was obtained which showed: IMPRESSION: 11.6 mm spiculated right lung nodule just anterior to the major fissure with associated fissure all retraction. Lung-RADS Category 4A, suspicious. Follow up low-dose chest CT without contrast in 3 months (please use the following order, "CT CHEST LCS NODULE FOLLOW-UP W/O CM") is recommended. Alternatively, PET may be considered when there is a solid component 45mm or larger.  Other bilateral pulmonary nodules evident. Attention on follow-up.   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 She did not wish to pursue surgery  She sees DR. Erlene Quan for her renal mass. She is not a candidate for surgery and underwent cryoablation Interval history patient reports no new changes since her last visit.  She has chronic fatigue.  She has ongoing pain in her bilateral legs   Review of Systems  Constitutional: Positive for malaise/fatigue. Negative for chills, fever and weight loss.  HENT: Negative for congestion, ear discharge and nosebleeds.   Eyes: Negative for blurred vision.  Respiratory: Negative for cough, hemoptysis, sputum production, shortness of breath and wheezing.   Cardiovascular: Negative for chest pain, palpitations, orthopnea and claudication.  Gastrointestinal: Negative for abdominal pain, blood in stool, constipation, diarrhea, heartburn, melena, nausea and vomiting.  Genitourinary: Negative for dysuria,  flank pain, frequency, hematuria and urgency.  Musculoskeletal: Negative for back pain,  joint pain and myalgias.       Bilateral leg pain  Skin: Negative for rash.  Neurological: Negative for dizziness, tingling, focal weakness, seizures, weakness and headaches.  Endo/Heme/Allergies: Does not bruise/bleed easily.  Psychiatric/Behavioral: Negative for depression and suicidal ideas. The patient does not have insomnia.     No Known Allergies  Past Medical History:  Diagnosis Date  . Alcohol abuse   . Alcoholic cirrhosis ([place])    GI-- dr skulskie/  hx ascites 2017   . Anxiety   . Chronic hepatitis C ([place])    treated w/ harvoni(2018)  . Chronic LBP 12/25/2014   Overview:  Post extensive surgery   . Chronic pain syndrome    neck , upper back, post extensive spinal surgery  . DDD (degenerative disc disease), lumbar   . Diverticulosis   . Emphysema lung ([place])   . Fibromyalgia   . Full dentures    partials, upper and lower  . GERD (gastroesophageal reflux disease)    gastroparesis  . Hiatal hernia   . History of GI bleed    06/ 2017  ulcers and gastric erosions, transfused x4/  10/ 2017 unknown source/  01/ 2018  transfused x2 units and IV Iron   . Hypertension   . Iron deficiency anemia due to chronic blood loss    hemotologist-- dr Alvy Bimler  . Lumbar facet joint syndrome   . Lumbar spondylosis   . Lung cancer ([place]) 11/25/2016   right upper lobe nodule , Stage I,  treatment SBRT  . MDD (major depressive disorder)   . Nephrolithiasis    per CT 09-30-2017 right side nonobstructive  . OA (osteoarthritis of spine)   . Polyarthritis   . Polyneuropathy   . Restless leg syndrome   . Right renal mass   . RLS (restless legs syndrome)   . SOB (shortness of breath)   . Type 2 diabetes mellitus ([place])    followed by pcp  . Urinary frequency   . Vitamin D deficiency disease     Past Surgical History:  Procedure Laterality Date  . COLONOSCOPY WITH PROPOFOL N/A 09/25/2015   Procedure: COLONOSCOPY WITH PROPOFOL;  Surgeon: Lollie Sails, MD;  Location: [facility] ENDOSCOPY;   Service: Endoscopy;  Laterality: N/A;  . COLONOSCOPY WITH PROPOFOL N/A 04/01/2016   Procedure: COLONOSCOPY WITH PROPOFOL;  Surgeon: Lollie Sails, MD;  Location: [facility] ENDOSCOPY;  Service: Endoscopy;  Laterality: N/A;  . ELECTROMAGNETIC NAVIGATION BROCHOSCOPY N/A 11/25/2016   Procedure: ELECTROMAGNETIC NAVIGATION BRONCHOSCOPY;  Surgeon: Flora Lipps, MD;  Location: [facility] ORS;  Service: Cardiopulmonary;  Laterality: N/A;  . ENDOMETRIAL ABLATION  2007  . ESOPHAGOGASTRODUODENOSCOPY N/A 04/14/2015   Procedure: ESOPHAGOGASTRODUODENOSCOPY (EGD);  Surgeon: Lollie Sails, MD;  Location: [facility] ENDOSCOPY;  Service: Endoscopy;  Laterality: N/A;  . ESOPHAGOGASTRODUODENOSCOPY (EGD) WITH PROPOFOL N/A 09/16/2015   Procedure: ESOPHAGOGASTRODUODENOSCOPY (EGD) WITH PROPOFOL;  Surgeon: Lollie Sails, MD;  Location: [facility] ENDOSCOPY;  Service: Endoscopy;  Laterality: N/A;  . ESOPHAGOGASTRODUODENOSCOPY (EGD) WITH PROPOFOL N/A 01/22/2016   Procedure: ESOPHAGOGASTRODUODENOSCOPY (EGD) WITH PROPOFOL;  Surgeon: [name], MD;  Location: [place];  Service: Endoscopy;  Laterality: N/A;  . ESOPHAGOGASTRODUODENOSCOPY (EGD) WITH PROPOFOL N/A 04/01/2016   Procedure: ESOPHAGOGASTRODUODENOSCOPY (EGD) WITH PROPOFOL;  Surgeon: Lollie Sails, MD;  Location: [facility] ENDOSCOPY;  Service: Endoscopy;  Laterality: N/A;  . FOOT ARTHRODESIS Right 04/06/2018   Procedure: ARTHRODESIS - HALLUX/IPJOINT;  Surgeon: [name], DPM;  Location: [place];  Service: Podiatry;  Laterality: Right;  HAMMERLOCK IMPLANTS PARAGON 4-0 SCREW LMA LOCAL Diabetic - oral meds  . IR RADIOLOGIST EVAL & MGMT  04/13/2017  . IR RADIOLOGIST EVAL & MGMT  11/15/2017  . RADIOLOGY WITH ANESTHESIA Right 12/14/2017   Procedure: CT MICROWAVE THERMAL ABLATION AND BIOPSY WITH ANESTHESIA;  Surgeon: Markus Daft, MD;  Location: [facility] ORS;  Service: Anesthesiology;  Laterality: Right;  . SPINAL FUSION  12/19/2011   T5 to sacrum (rod, screws)  .  TONSILLECTOMY  age 62  . TUBAL LIGATION Bilateral yrs ago  . UMBILICAL HERNIA REPAIR N/A 11/06/2015   Procedure: HERNIA REPAIR UMBILICAL ADULT;  Surgeon: [name], MD;  Location: [facility] ORS;  Service: General;  Laterality: N/A;    Social History   Socioeconomic History  . Marital status: Married    Spouse name: Not on file  . Number of children: Not on file  . Years of education: Not on file  . Highest education level: Not on file  Occupational History  . Not on file  Social Needs  . Financial resource strain: Not on file  . Food insecurity    Worry: Not on file    Inability: Not on file  . Transportation needs    Medical: Not on file    Non-medical: Not on file  Tobacco Use  . Smoking status: Current Every Day Smoker    Packs/day: 0.50    Years: 45.00    Pack years: 22.50    Types: Cigarettes  . Smokeless tobacco: Never Used  . Tobacco comment: 12-05-2017 2ppd until 2017 down to 1/2 ppd  Substance and Sexual Activity  . Alcohol use: Not Currently    Alcohol/week: 0.0 standard drinks    Comment: did drink 12 pack beer per week (7 oz each)  . Drug use: No  . Sexual activity: Not on file  Lifestyle  . Physical activity    Days per week: Not on file    Minutes per session: Not on file  . Stress: Not on file  Relationships  . Social Herbalist on phone: Not on file    Gets together: Not on file    Attends religious service: Not on file    Active member of club or organization: Not on file    Attends meetings of clubs or organizations: Not on file    Relationship status: Not on file  . Intimate partner violence    Fear of current or ex partner: Not on file    Emotionally abused: Not on file    Physically abused: Not on file    Forced sexual activity: Not on file  Other Topics Concern  . Not on file  Social History Narrative   Lives at home with husband    Family History  Problem Relation Age of Onset  . Stroke Mother   . Heart disease Mother    . [name] Father   . Breast cancer Sister 45  . Kidney disease Neg Hx   . Bladder Cancer Neg Hx   . Kidney cancer Neg Hx   . Prostate cancer Neg Hx      Current Outpatient Medications:  .  cyclobenzaprine (FLEXERIL) 5 MG tablet, Take 1 tablet (5 mg total) by mouth 3 (three) times daily as needed for muscle spasms., Disp: 90 tablet, Rfl: 2 .  escitalopram (LEXAPRO) 10 MG tablet, Take 10 mg by mouth daily. , Disp: , Rfl:  .  furosemide (LASIX) 40 MG tablet, Take 40 mg by mouth 2 (two) times daily. , Disp: , Rfl:  .  linagliptin (TRADJENTA) 5 MG TABS tablet, Take 5 mg by mouth daily., Disp: , Rfl:  .  Multiple Vitamin (MULTIVITAMIN WITH MINERALS) TABS tablet, Take 1 tablet by mouth daily., Disp: , Rfl:  .  Oxycodone HCl 20 MG TABS, Take 1 tablet (20 mg total) by mouth every 6 (six) hours for 30 days. Must last 30 days, Disp: 120 tablet, Rfl: 0 .  Oxycodone HCl 20 MG TABS, Take 1 tablet (20 mg total) by mouth every 6 (six) hours for 30 days. Must last 30 days, Disp: 120 tablet, Rfl: 0 .  [START ON 11/12/2018] Oxycodone HCl 20 MG TABS, Take 1 tablet (20 mg total) by mouth every 6 (six) hours for 30 days. Must last 30 days, Disp: 120 tablet, Rfl: 0 .  OZEMPIC 1 MG/DOSE SOPN, Inject 1 mg into the skin every Tuesday., Disp: , Rfl:  .  pantoprazole (PROTONIX) 40 MG tablet, Take 40 mg by mouth 2 (two) times daily. , Disp: , Rfl: 11 .  pregabalin (LYRICA) 200 MG capsule, Take 200 mg by mouth 2 (two) times daily., Disp: , Rfl:  .  spironolactone (ALDACTONE) 50 MG tablet, Take 100 mg by mouth daily. , Disp: , Rfl:   Ct Abdomen Pelvis W Wo Contrast  Result Date: 10/04/2018 CLINICAL DATA:  Renal cell carcinoma.  History of lung cancer. EXAM: CT CHEST, ABDOMEN, AND PELVIS WITHOUT AND WITH CONTRAST TECHNIQUE: Multidetector CT imaging of the chest, abdomen and pelvis was performed following the standard protocol before and during bolus administration of intravenous contrast. CONTRAST:  158mL OMNIPAQUE  IOHEXOL 300 MG/ML  SOLN COMPARISON:  Limb 152019 FINDINGS: CT CHEST FINDINGS Cardiovascular: The heart size is normal. No substantial pericardial effusion. Mediastinum/Nodes: No mediastinal lymphadenopathy. There is no hilar lymphadenopathy. There is no axillary lymphadenopathy. The esophagus has normal imaging features. Lungs/Pleura: The architectural distortion with scarring in the right parahilar lung is similar to prior. Numerous bilateral ground-glass opacities are noted in the lungs bilaterally, progressive in the interval. Patchy ground-glass opacity in the lingula and posterior right middle lobe (image 90/series 10) is new in the interval. Numerous other small 5-10 mm ground-glass nodules scattered through both lungs are similar to prior with some progression of nodularity in the upper lungs. Cystic 2.3 x 4.0 cm lesion posterior right upper lobe is mildly progressive since prior when it was measured at 3.6 x 2.3 cm no pleural effusion. Musculoskeletal: No worrisome lytic or sclerotic osseous abnormality. CT ABDOMEN PELVIS FINDINGS Hepatobiliary: Nodular liver contour compatible with reported history of cirrhosis. Tiny calcified gallstones evident. No intrahepatic or extrahepatic biliary dilation. Pancreas: No focal mass lesion. No dilatation of the main duct. No intraparenchymal cyst. No peripancreatic edema. Spleen: No splenomegaly. No focal mass lesion. Adrenals/Urinary Tract: No adrenal nodule or mass. Ablation defect in the upper pole right kidney has decreased in size in the interval measuring 2.8 x 1.8 cm today compared to 3.1 x 2.2 cm when I remeasure in a similar fashion on the prior study. Tiny hypoattenuating lesions in the left kidney are similar to prior. No evidence for hydroureter. The urinary bladder appears normal for the degree of distention. Stomach/Bowel: Stomach is unremarkable. No gastric wall thickening. No evidence of outlet obstruction. Duodenum is normally positioned as is the  ligament of Treitz. No small bowel wall thickening. No small bowel dilatation. The terminal ileum is normal. The appendix is normal.  No gross colonic mass. No colonic wall thickening. Vascular/Lymphatic: There is abdominal aortic atherosclerosis without aneurysm. There is no gastrohepatic or hepatoduodenal ligament lymphadenopathy. No intraperitoneal or retroperitoneal lymphadenopathy. No pelvic sidewall lymphadenopathy. Reproductive: 3.6 cm benign appearing right adnexal cyst noted. Other: No intraperitoneal free fluid. Musculoskeletal: Status post extensive thoracolumbar fusion. No worrisome lytic or sclerotic osseous abnormality. IMPRESSION: 1. Similar appearance of post treatment changes in the parahilar right mid lung. 2. Interval progression of nodular sub solid nodularity and patchy ground-glass attenuation in the lungs bilaterally. As on previous study, low-grade multifocal adenocarcinoma not excluded. The more patchy areas of new ground-glass attenuation today could represent superimposed areas of infectious/inflammatory alveolitis. 3. Interval evolution of the ablation defect upper pole right kidney without evidence for local recurrence. No metastatic disease in the abdomen or pelvis. 4. Cirrhosis. 5. Cholelithiasis. 6. 3.6 cm benign appearing right adnexal cyst. Based on patient age and lesion size, follow-up ultrasound recommended to further evaluate. This recommendation follows ACR consensus guidelines: White Paper of the ACR Incidental Findings Committee II on Adnexal Findings. J Am Coll Radiol 2013:10:675-681. 7.  Aortic Atherosclerois (ICD10-170.0) Electronically Signed   By: Misty Stanley M.D.   On: 10/04/2018 11:43   Ct Chest W Contrast  Result Date: 10/04/2018 CLINICAL DATA:  Renal cell carcinoma.  History of lung cancer. EXAM: CT CHEST, ABDOMEN, AND PELVIS WITHOUT AND WITH CONTRAST TECHNIQUE: Multidetector CT imaging of the chest, abdomen and pelvis was performed following the standard  protocol before and during bolus administration of intravenous contrast. CONTRAST:  183mL OMNIPAQUE IOHEXOL 300 MG/ML  SOLN COMPARISON:  Limb 152019 FINDINGS: CT CHEST FINDINGS Cardiovascular: The heart size is normal. No substantial pericardial effusion. Mediastinum/Nodes: No mediastinal lymphadenopathy. There is no hilar lymphadenopathy. There is no axillary lymphadenopathy. The esophagus has normal imaging features. Lungs/Pleura: The architectural distortion with scarring in the right parahilar lung is similar to prior. Numerous bilateral ground-glass opacities are noted in the lungs bilaterally, progressive in the interval. Patchy ground-glass opacity in the lingula and posterior right middle lobe (image 90/series 10) is new in the interval. Numerous other small 5-10 mm ground-glass nodules scattered through both lungs are similar to prior with some progression of nodularity in the upper lungs. Cystic 2.3 x 4.0 cm lesion posterior right upper lobe is mildly progressive since prior when it was measured at 3.6 x 2.3 cm no pleural effusion. Musculoskeletal: No worrisome lytic or sclerotic osseous abnormality. CT ABDOMEN PELVIS FINDINGS Hepatobiliary: Nodular liver contour compatible with reported history of cirrhosis. Tiny calcified gallstones evident. No intrahepatic or extrahepatic biliary dilation. Pancreas: No focal mass lesion. No dilatation of the main duct. No intraparenchymal cyst. No peripancreatic edema. Spleen: No splenomegaly. No focal mass lesion. Adrenals/Urinary Tract: No adrenal nodule or mass. Ablation defect in the upper pole right kidney has decreased in size in the interval measuring 2.8 x 1.8 cm today compared to 3.1 x 2.2 cm when I remeasure in a similar fashion on the prior study. Tiny hypoattenuating lesions in the left kidney are similar to prior. No evidence for hydroureter. The urinary bladder appears normal for the degree of distention. Stomach/Bowel: Stomach is unremarkable. No gastric  wall thickening. No evidence of outlet obstruction. Duodenum is normally positioned as is the ligament of Treitz. No small bowel wall thickening. No small bowel dilatation. The terminal ileum is normal. The appendix is normal. No gross colonic mass. No colonic wall thickening. Vascular/Lymphatic: There is abdominal aortic atherosclerosis without aneurysm. There is no gastrohepatic or hepatoduodenal ligament lymphadenopathy.  No intraperitoneal or retroperitoneal lymphadenopathy. No pelvic sidewall lymphadenopathy. Reproductive: 3.6 cm benign appearing right adnexal cyst noted. Other: No intraperitoneal free fluid. Musculoskeletal: Status post extensive thoracolumbar fusion. No worrisome lytic or sclerotic osseous abnormality. IMPRESSION: 1. Similar appearance of post treatment changes in the parahilar right mid lung. 2. Interval progression of nodular sub solid nodularity and patchy ground-glass attenuation in the lungs bilaterally. As on previous study, low-grade multifocal adenocarcinoma not excluded. The more patchy areas of new ground-glass attenuation today could represent superimposed areas of infectious/inflammatory alveolitis. 3. Interval evolution of the ablation defect upper pole right kidney without evidence for local recurrence. No metastatic disease in the abdomen or pelvis. 4. Cirrhosis. 5. Cholelithiasis. 6. 3.6 cm benign appearing right adnexal cyst. Based on patient age and lesion size, follow-up ultrasound recommended to further evaluate. This recommendation follows ACR consensus guidelines: White Paper of the ACR Incidental Findings Committee II on Adnexal Findings. J Am Coll Radiol 2013:10:675-681. 7.  Aortic Atherosclerois (ICD10-170.0) Electronically Signed   By: Misty Stanley M.D.   On: 10/04/2018 11:43   Dg Abd 2 Views  Result Date: 10/06/2018 CLINICAL DATA:  Constipation. EXAM: ABDOMEN - 2 VIEW COMPARISON:  Plain film of the abdomen dated 06/15/2017. FINDINGS: Fairly large amount of stool  within the RIGHT colon and transverse colon. Overall bowel gas pattern is nonobstructive. No evidence of soft tissue mass or abnormal fluid collection. No evidence of free intraperitoneal air. Chronic scarring/fibrosis at the lung bases. Extensive fusion hardware within the thoracolumbar spine and traversing the bilateral SI joints. No acute appearing osseous abnormality. IMPRESSION: 1. Fairly large amount of stool within the RIGHT colon and transverse colon, compatible with the given history of constipation. 2. Nonobstructive bowel gas pattern. Electronically Signed   By: Franki Cabot M.D.   On: 10/06/2018 10:52    No images are attached to the encounter.   CMP Latest Ref Rng & Units 10/04/2018  Glucose 70 - 99 mg/dL -  BUN 6 - 20 mg/dL -  Creatinine 0.44 - 1.00 mg/dL 0.60  Sodium 135 - 145 mmol/L -  Potassium 3.5 - 5.1 mmol/L -  Chloride 98 - 111 mmol/L -  CO2 22 - 32 mmol/L -  Calcium 8.9 - 10.3 mg/dL -  Total Protein 6.5 - 8.1 g/dL -  Total Bilirubin 0.3 - 1.2 mg/dL -  Alkaline Phos 38 - 126 U/L -  AST 15 - 41 U/L -  ALT 0 - 44 U/L -   CBC Latest Ref Rng & Units 10/04/2018  WBC 4.0 - 10.5 K/uL 9.3  Hemoglobin 12.0 - 15.0 g/dL 8.6(L)  Hematocrit 36.0 - 46.0 % 28.6(L)  Platelets 150 - 400 K/uL 179    Assessment and plan: Patient is a 58 year old female and this is a video visit to discuss consensus from tumor board  1.  Patient has bilateral groundglass opacities in her lungs which is concerning for low-grade adenocarcinoma and will start daily mildly worse on her recent scan in July 2020 as compared to the scan done in November 2020.  We discussed her case at tumor board and plan was to get a repeat CT chest without contrast in 3 months.  If the spots appear worse, I will refer him to pulmonary for a navigational bronc at that time.  Patient verbalized understanding  2.  Iron deficiency anemia: She is currently getting Feraheme  Follow-up instructions: CBC with differential,  ferritin and iron studies and CT chest without contrast in 3 months time and I  will discuss her case at tumor board and follow-up with her  I discussed the assessment and treatment plan with the patient. The patient was provided an opportunity to ask questions and all were answered. The patient agreed with the plan and demonstrated an understanding of the instructions.   The patient was advised to call back or seek an in-person evaluation if the symptoms worsen or if the condition fails to improve as anticipated.    Visit Diagnosis: 1. Lung nodules   2. Iron deficiency anemia due to chronic blood loss     Dr. Randa Evens, MD, MPH [facility] Pager- [phone] 10/13/2018 11:03 AM

## 2018-10-13 NOTE — Progress Notes (Signed)
Patient is doing virtual visit, she complains of constipation she was given a prep kit to help have bm and has only had 3 bm in the last month but does take dulcolax daily. Dr. Gustavo Lah have done imaging and no blockages.

## 2018-11-03 DIAGNOSIS — S42202A Unspecified fracture of upper end of left humerus, initial encounter for closed fracture: Secondary | ICD-10-CM | POA: Insufficient documentation

## 2018-11-13 ENCOUNTER — Other Ambulatory Visit: Payer: Self-pay | Admitting: Gastroenterology

## 2018-11-13 DIAGNOSIS — K7031 Alcoholic cirrhosis of liver with ascites: Secondary | ICD-10-CM

## 2018-11-14 ENCOUNTER — Other Ambulatory Visit: Payer: Self-pay

## 2018-11-14 ENCOUNTER — Ambulatory Visit
Admission: RE | Admit: 2018-11-14 | Discharge: 2018-11-14 | Disposition: A | Discharge status: Home / Self Care | Payer: Medicare Other | Source: Ambulatory Visit | Attending: Gastroenterology | Admitting: Gastroenterology

## 2018-11-14 ENCOUNTER — Telehealth: Payer: Self-pay

## 2018-11-14 DIAGNOSIS — K7031 Alcoholic cirrhosis of liver with ascites: Secondary | ICD-10-CM | POA: Insufficient documentation

## 2018-11-14 NOTE — Telephone Encounter (Signed)
Pt called to check on next appt and wanted to inform us she had fell and Fractured Left shoulder in 3 places. No call back. Just FYI

## 2018-12-06 ENCOUNTER — Ambulatory Visit: Payer: Medicare Other | Admitting: Pain Medicine

## 2018-12-07 ENCOUNTER — Encounter: Payer: Self-pay | Admitting: Pain Medicine

## 2018-12-10 DIAGNOSIS — G893 Neoplasm related pain (acute) (chronic): Secondary | ICD-10-CM | POA: Insufficient documentation

## 2018-12-10 NOTE — Progress Notes (Signed)
Pain Management Virtual Encounter Note - Virtual Visit via Telephone Telehealth (real-time audio visits between healthcare provider and patient).   [name] Phone No. & Preferred Pharmacy:  [phone] (home); [phone] (mobile); (Preferred) [phone] [email or web address]  [id number] [name], [facility] - [place] [address] [place] [address] Phone: [phone] Fax: [phone]    Pre-screening note:  Our staff contacted Audrey Garrett and offered her an "in person", "face-to-face" appointment versus a telephone encounter. She indicated preferring the telephone encounter, at this time.   Reason for Virtual Visit: COVID-19*  Social distancing based on CDC and AMA recommendations.   I contacted Audrey Garrett on 12/11/2018 via telephone.      I clearly identified myself as Gaspar Cola, MD. I verified that I was speaking with the correct person using two identifiers (Name: Audrey Garrett, and date of birth: Nov 13, 1960).  Advanced Informed Consent I sought verbal advanced consent from Audrey Garrett for virtual visit interactions. I informed Audrey Garrett of possible security and privacy concerns, risks, and limitations associated with providing "not-in-person" medical evaluation and management services. I also informed Audrey Garrett of the availability of "in-person" appointments. Finally, I informed her that there would be a charge for the virtual visit and that she could be  personally, fully or partially, financially responsible for it. Audrey Garrett expressed understanding and agreed to proceed.   Historic Elements   Audrey Garrett is a 58 y.o. year old, female patient evaluated today after her last encounter by our practice on 11/14/2018. Audrey Garrett  has a past medical history of Alcohol abuse, Alcoholic cirrhosis ([place]), Anxiety, Chronic hepatitis C ([place]), Chronic LBP (12/25/2014), Chronic pain syndrome, DDD (degenerative disc disease), lumbar, Diverticulosis,  Emphysema lung ([place]), Fibromyalgia, Full dentures, GERD (gastroesophageal reflux disease), Hiatal hernia, History of GI bleed, Hypertension, Iron deficiency anemia due to chronic blood loss, Lumbar facet joint syndrome, Lumbar spondylosis, Lung cancer ([place]) (11/25/2016), MDD (major depressive disorder), Nephrolithiasis, OA (osteoarthritis of spine), Polyarthritis, Polyneuropathy, Restless leg syndrome, Right renal mass, RLS (restless legs syndrome), SOB (shortness of breath), Type 2 diabetes mellitus ([place]), Urinary frequency, and Vitamin D deficiency disease. She also  has a past surgical history that includes Esophagogastroduodenoscopy (N/A, 04/14/2015); Esophagogastroduodenoscopy (egd) with propofol (N/A, 09/16/2015); Colonoscopy with propofol (N/A, 09/25/2015); Umbilical hernia repair (N/A, 11/06/2015); Esophagogastroduodenoscopy (egd) with propofol (N/A, 01/22/2016); Colonoscopy with propofol (N/A, 04/01/2016); Esophagogastroduodenoscopy (egd) with propofol (N/A, 04/01/2016); Electormagnetic navigation bronchoscopy (N/A, 11/25/2016); IR Radiologist Eval & Mgmt (04/13/2017); IR Radiologist Eval & Mgmt (11/15/2017); Spinal fusion (12/19/2011); Endometrial ablation (2007); Tubal ligation (Bilateral, yrs ago); Tonsillectomy (age 79); Radiology with anesthesia (Right, 12/14/2017); and Foot arthrodesis (Right, 04/06/2018). Audrey Garrett has a current medication list which includes the following prescription(s): cyclobenzaprine, escitalopram, furosemide, multivitamin with minerals, oxycodone hcl, oxycodone hcl, oxycodone hcl, ozempic (1 mg/dose), pantoprazole, pregabalin, and spironolactone. She  reports that she has been smoking cigarettes. She has a 22.50 pack-year smoking history. She has never used smokeless tobacco. She reports previous alcohol use. She reports that she does not use drugs. Audrey Garrett has No Known Allergies.   HPI  Today, she is being contacted for medication management.  The patient indicates doing well with  the current medication regimen. No adverse reactions or side effects reported to the medications.  The patient indicates having been doing well until recently when she had a fall in which she fracture her humerus.  As a consequence of that fall, she has also been developing some tailbone pain (coccygodynia), which  occasionally will trigger pain all the way down into the bottom of her feet (S1 dermatomal distribution).  Today I have offered her a diagnostic caudal epidural steroid injection under fluoroscopic guidance and IV sedation.  I have explained the procedure to the patient and she has agreed to it.  We will be scheduling her as soon as possible for this caudal epidural steroid injection plus epidurogram.  Pharmacotherapy Assessment  Analgesic: Oxycodone IR 20 mg, 1 tab PO q 6 hrs(80 mg/day of oxycodone) MME/day:120 mg/day.   Monitoring: Pharmacotherapy: No side-effects or adverse reactions reported. [place] PMP: PDMP reviewed during this encounter.       Compliance: No problems identified. Effectiveness: Clinically acceptable. Plan: Refer to "POC".  UDS:  Summary  Date Value Ref Range Status  12/01/2017 FINAL  Final    Comment:    ==================================================================== TOXASSURE SELECT 13 (MW) ==================================================================== Test                             Result       Flag       Units Drug Present and Declared for Prescription Verification   Oxycodone                      1004         EXPECTED   ng/mg creat   Oxymorphone                    3423         EXPECTED   ng/mg creat   Noroxycodone                   6665         EXPECTED   ng/mg creat   Noroxymorphone                 3850         EXPECTED   ng/mg creat    Sources of oxycodone are scheduled prescription medications.    Oxymorphone, noroxycodone, and noroxymorphone are expected    metabolites of oxycodone. Oxymorphone is also available as a    scheduled  prescription medication. ==================================================================== Test                      Result    Flag   Units      Ref Range   Creatinine              26               mg/dL      >=20 ==================================================================== Declared Medications:  The flagging and interpretation on this report are based on the  following declared medications.  Unexpected results may arise from  inaccuracies in the declared medications.  **Note: The testing scope of this panel includes these medications:  Oxycodone  **Note: The testing scope of this panel does not include following  reported medications:  Cyclobenzaprine  Escitalopram (Lexapro)  Furosemide (Lasix)  Lactulose  Metformin  Multivitamin  Pantoprazole (Protonix)  Pregabalin (Lyrica)  Spironolactone (Aldactone)  Vitamin B1 ==================================================================== For clinical consultation, please call [phone]. ====================================================================    Laboratory Chemistry Profile (12 mo)  Renal: 02/02/2018: BUN 6 10/04/2018: Creatinine, Ser 0.60  Lab Results  Component Value Date   GFRAA >60 02/02/2018   GFRNONAA >60 02/02/2018   Hepatic: 02/02/2018: Albumin 3.7 Lab Results  Component Value Date   AST  64 (H) 02/02/2018   ALT 34 02/02/2018   Other: No results found for requested labs within last 8760 hours. Note: Above Lab results reviewed.  Imaging  Last 90 days:  Ct Abdomen Pelvis W Wo Contrast  Result Date: 10/04/2018 CLINICAL DATA:  Renal cell carcinoma.  History of lung cancer. EXAM: CT CHEST, ABDOMEN, AND PELVIS WITHOUT AND WITH CONTRAST TECHNIQUE: Multidetector CT imaging of the chest, abdomen and pelvis was performed following the standard protocol before and during bolus administration of intravenous contrast. CONTRAST:  110mL OMNIPAQUE IOHEXOL 300 MG/ML  SOLN COMPARISON:  Limb 152019 FINDINGS: CT  CHEST FINDINGS Cardiovascular: The heart size is normal. No substantial pericardial effusion. Mediastinum/Nodes: No mediastinal lymphadenopathy. There is no hilar lymphadenopathy. There is no axillary lymphadenopathy. The esophagus has normal imaging features. Lungs/Pleura: The architectural distortion with scarring in the right parahilar lung is similar to prior. Numerous bilateral ground-glass opacities are noted in the lungs bilaterally, progressive in the interval. Patchy ground-glass opacity in the lingula and posterior right middle lobe (image 90/series 10) is new in the interval. Numerous other small 5-10 mm ground-glass nodules scattered through both lungs are similar to prior with some progression of nodularity in the upper lungs. Cystic 2.3 x 4.0 cm lesion posterior right upper lobe is mildly progressive since prior when it was measured at 3.6 x 2.3 cm no pleural effusion. Musculoskeletal: No worrisome lytic or sclerotic osseous abnormality. CT ABDOMEN PELVIS FINDINGS Hepatobiliary: Nodular liver contour compatible with reported history of cirrhosis. Tiny calcified gallstones evident. No intrahepatic or extrahepatic biliary dilation. Pancreas: No focal mass lesion. No dilatation of the main duct. No intraparenchymal cyst. No peripancreatic edema. Spleen: No splenomegaly. No focal mass lesion. Adrenals/Urinary Tract: No adrenal nodule or mass. Ablation defect in the upper pole right kidney has decreased in size in the interval measuring 2.8 x 1.8 cm today compared to 3.1 x 2.2 cm when I remeasure in a similar fashion on the prior study. Tiny hypoattenuating lesions in the left kidney are similar to prior. No evidence for hydroureter. The urinary bladder appears normal for the degree of distention. Stomach/Bowel: Stomach is unremarkable. No gastric wall thickening. No evidence of outlet obstruction. Duodenum is normally positioned as is the ligament of Treitz. No small bowel wall thickening. No small bowel  dilatation. The terminal ileum is normal. The appendix is normal. No gross colonic mass. No colonic wall thickening. Vascular/Lymphatic: There is abdominal aortic atherosclerosis without aneurysm. There is no gastrohepatic or hepatoduodenal ligament lymphadenopathy. No intraperitoneal or retroperitoneal lymphadenopathy. No pelvic sidewall lymphadenopathy. Reproductive: 3.6 cm benign appearing right adnexal cyst noted. Other: No intraperitoneal free fluid. Musculoskeletal: Status post extensive thoracolumbar fusion. No worrisome lytic or sclerotic osseous abnormality. IMPRESSION: 1. Similar appearance of post treatment changes in the parahilar right mid lung. 2. Interval progression of nodular sub solid nodularity and patchy ground-glass attenuation in the lungs bilaterally. As on previous study, low-grade multifocal adenocarcinoma not excluded. The more patchy areas of new ground-glass attenuation today could represent superimposed areas of infectious/inflammatory alveolitis. 3. Interval evolution of the ablation defect upper pole right kidney without evidence for local recurrence. No metastatic disease in the abdomen or pelvis. 4. Cirrhosis. 5. Cholelithiasis. 6. 3.6 cm benign appearing right adnexal cyst. Based on patient age and lesion size, follow-up ultrasound recommended to further evaluate. This recommendation follows ACR consensus guidelines: White Paper of the ACR Incidental Findings Committee II on Adnexal Findings. J Am Coll Radiol 2013:10:675-681. 7.  Aortic Atherosclerois (ICD10-170.0) Electronically Signed   By:  Misty Stanley M.D.   On: 10/04/2018 11:43   Ct Chest W Contrast  Result Date: 10/04/2018 CLINICAL DATA:  Renal cell carcinoma.  History of lung cancer. EXAM: CT CHEST, ABDOMEN, AND PELVIS WITHOUT AND WITH CONTRAST TECHNIQUE: Multidetector CT imaging of the chest, abdomen and pelvis was performed following the standard protocol before and during bolus administration of intravenous contrast.  CONTRAST:  145mL OMNIPAQUE IOHEXOL 300 MG/ML  SOLN COMPARISON:  Limb 152019 FINDINGS: CT CHEST FINDINGS Cardiovascular: The heart size is normal. No substantial pericardial effusion. Mediastinum/Nodes: No mediastinal lymphadenopathy. There is no hilar lymphadenopathy. There is no axillary lymphadenopathy. The esophagus has normal imaging features. Lungs/Pleura: The architectural distortion with scarring in the right parahilar lung is similar to prior. Numerous bilateral ground-glass opacities are noted in the lungs bilaterally, progressive in the interval. Patchy ground-glass opacity in the lingula and posterior right middle lobe (image 90/series 10) is new in the interval. Numerous other small 5-10 mm ground-glass nodules scattered through both lungs are similar to prior with some progression of nodularity in the upper lungs. Cystic 2.3 x 4.0 cm lesion posterior right upper lobe is mildly progressive since prior when it was measured at 3.6 x 2.3 cm no pleural effusion. Musculoskeletal: No worrisome lytic or sclerotic osseous abnormality. CT ABDOMEN PELVIS FINDINGS Hepatobiliary: Nodular liver contour compatible with reported history of cirrhosis. Tiny calcified gallstones evident. No intrahepatic or extrahepatic biliary dilation. Pancreas: No focal mass lesion. No dilatation of the main duct. No intraparenchymal cyst. No peripancreatic edema. Spleen: No splenomegaly. No focal mass lesion. Adrenals/Urinary Tract: No adrenal nodule or mass. Ablation defect in the upper pole right kidney has decreased in size in the interval measuring 2.8 x 1.8 cm today compared to 3.1 x 2.2 cm when I remeasure in a similar fashion on the prior study. Tiny hypoattenuating lesions in the left kidney are similar to prior. No evidence for hydroureter. The urinary bladder appears normal for the degree of distention. Stomach/Bowel: Stomach is unremarkable. No gastric wall thickening. No evidence of outlet obstruction. Duodenum is normally  positioned as is the ligament of Treitz. No small bowel wall thickening. No small bowel dilatation. The terminal ileum is normal. The appendix is normal. No gross colonic mass. No colonic wall thickening. Vascular/Lymphatic: There is abdominal aortic atherosclerosis without aneurysm. There is no gastrohepatic or hepatoduodenal ligament lymphadenopathy. No intraperitoneal or retroperitoneal lymphadenopathy. No pelvic sidewall lymphadenopathy. Reproductive: 3.6 cm benign appearing right adnexal cyst noted. Other: No intraperitoneal free fluid. Musculoskeletal: Status post extensive thoracolumbar fusion. No worrisome lytic or sclerotic osseous abnormality. IMPRESSION: 1. Similar appearance of post treatment changes in the parahilar right mid lung. 2. Interval progression of nodular sub solid nodularity and patchy ground-glass attenuation in the lungs bilaterally. As on previous study, low-grade multifocal adenocarcinoma not excluded. The more patchy areas of new ground-glass attenuation today could represent superimposed areas of infectious/inflammatory alveolitis. 3. Interval evolution of the ablation defect upper pole right kidney without evidence for local recurrence. No metastatic disease in the abdomen or pelvis. 4. Cirrhosis. 5. Cholelithiasis. 6. 3.6 cm benign appearing right adnexal cyst. Based on patient age and lesion size, follow-up ultrasound recommended to further evaluate. This recommendation follows ACR consensus guidelines: White Paper of the ACR Incidental Findings Committee II on Adnexal Findings. J Am Coll Radiol 2013:10:675-681. 7.  Aortic Atherosclerois (ICD10-170.0) Electronically Signed   By: Misty Stanley M.D.   On: 10/04/2018 11:43   US Abdomen Limited  Result Date: 11/14/2018 CLINICAL DATA:  Abdominal distension.  Hepatic cirrhosis EXAM: LIMITED ABDOMEN ULTRASOUND FOR ASCITES TECHNIQUE: Limited ultrasound survey for ascites was performed in all four abdominal quadrants. COMPARISON:  November 19, 2016 FINDINGS: No demonstrable ascites.  Loops of peristalsing bowel evident. IMPRESSION: No demonstrable ascites. Electronically Signed   By: Lowella Grip III M.D.   On: 11/14/2018 08:07   Dg Abd 2 Views  Result Date: 10/06/2018 CLINICAL DATA:  Constipation. EXAM: ABDOMEN - 2 VIEW COMPARISON:  Plain film of the abdomen dated 06/15/2017. FINDINGS: Fairly large amount of stool within the RIGHT colon and transverse colon. Overall bowel gas pattern is nonobstructive. No evidence of soft tissue mass or abnormal fluid collection. No evidence of free intraperitoneal air. Chronic scarring/fibrosis at the lung bases. Extensive fusion hardware within the thoracolumbar spine and traversing the bilateral SI joints. No acute appearing osseous abnormality. IMPRESSION: 1. Fairly large amount of stool within the RIGHT colon and transverse colon, compatible with the given history of constipation. 2. Nonobstructive bowel gas pattern. Electronically Signed   By: Franki Cabot M.D.   On: 10/06/2018 10:52   Assessment  The primary encounter diagnosis was Chronic pain syndrome. Diagnoses of Cancer-related pain, Chronic low back pain (Primary Area of Pain) (Bilateral) (L>R), Chronic lower extremity pain (Secondary area of Pain) (Left), Failed back surgical syndrome, H/O malignant neoplasm of lung, H/O renal cell cancer, Polyneuropathy, Chronic musculoskeletal pain, Pharmacologic therapy, Disorder of skeletal system, Problems influencing health status, Hypomagnesemia (low magnesium levels), Alcoholic cirrhosis of liver with ascites ([place]), DDD (degenerative disc disease), lumbosacral, and Coccygodynia were also pertinent to this visit.  Plan of Care  I have discontinued Audrey Garrett's linagliptin, Oxycodone HCl, and Oxycodone HCl. I have also changed her Oxycodone HCl. Additionally, I am having her start on Oxycodone HCl and Oxycodone HCl. Lastly, I am having her maintain her furosemide, pantoprazole, spironolactone,  escitalopram, Ozempic (1 MG/DOSE), pregabalin, multivitamin with minerals, and cyclobenzaprine.  Pharmacotherapy (Medications Ordered): Meds ordered this encounter  Medications  . cyclobenzaprine (FLEXERIL) 5 MG tablet    Sig: Take 1 tablet (5 mg total) by mouth 3 (three) times daily as needed for muscle spasms.    Dispense:  90 tablet    Refill:  2    Fill one day early if pharmacy is closed on scheduled refill date. May substitute for generic if available.  . Oxycodone HCl 20 MG TABS    Sig: Take 1 tablet (20 mg total) by mouth every 6 (six) hours. Must last 30 days    Dispense:  120 tablet    Refill:  0    Chronic Pain: STOP Act (Not applicable) Fill 1 day early if closed on refill date. Do not fill until: 12/12/2018. To last until: 01/11/2019. Avoid benzodiazepines within 8 hours of opioids  . Oxycodone HCl 20 MG TABS    Sig: Take 1 tablet (20 mg total) by mouth every 6 (six) hours. Must last 30 days    Dispense:  120 tablet    Refill:  0    Chronic Pain: STOP Act (Not applicable) Fill 1 day early if closed on refill date. Do not fill until: 01/11/2019. To last until: 02/10/2019. Avoid benzodiazepines within 8 hours of opioids  . Oxycodone HCl 20 MG TABS    Sig: Take 1 tablet (20 mg total) by mouth every 6 (six) hours. Must last 30 days    Dispense:  120 tablet    Refill:  0    Chronic Pain: STOP Act (Not applicable) Fill 1 day early if  closed on refill date. Do not fill until: 02/10/2019. To last until: 03/12/2019. Avoid benzodiazepines within 8 hours of opioids   Orders:  Orders Placed This Encounter  Procedures  . Caudal Epidural Injection    Standing Status:   Future    Standing Expiration Date:   01/10/2019    Scheduling Instructions:     Laterality: Midline     Level(s): Sacrococcygeal canal (Tailbone area)     Sedation: [name] choice     Scheduling Timeframe: As soon as pre-approved    Order Specific Question:   Where will this procedure be performed?    Answer:    [facility]  . ToxASSURE Select 13 (MW), Urine    Volume: 30 ml(s). Minimum 3 ml of urine is needed. Document temperature of fresh sample. Indications: Long term (current) use of opiate analgesic (Z79.891)  . Comp. Metabolic Panel (12)    With GFR. Indications: Chronic Pain Syndrome (G89.4) & Pharmacotherapy (Z30.076)    Order Specific Question:   Has the patient fasted?    Answer:   No    Order Specific Question:   CC Results    Answer:   PCP-NURSE [[id number]]  . Magnesium    Indication: Pharmacologic therapy (L45.625)    Order Specific Question:   CC Results    Answer:   PCP-NURSE [[id number]]  . Vitamin B12    Indication: Pharmacologic therapy (D42.876).    Order Specific Question:   CC Results    Answer:   PCP-NURSE [[id number]]  . Sedimentation rate    Indication: Disorder of skeletal system (M89.9)    Order Specific Question:   CC Results    Answer:   PCP-NURSE [[id number]]  . 25-Hydroxyvitamin D Lcms D2+D3    Indication: Disorder of skeletal system (M89.9).    Order Specific Question:   CC Results    Answer:   PCP-NURSE [[id number]]  . C-reactive protein    Indication: Problems influencing health status (Z78.9)    Order Specific Question:   CC Results    Answer:   PCP-NURSE [[id number]]   Follow-up plan:   Return in about 3 months (around 03/12/2019) for Procedure (w/ sedation): (ML) Caudal ESI + Dx Epidurogram.      Interventional management options:  Considering:   NOTE: Hx of Thrombocytopenia  Diagnostic bilateral lumbar facetblock  Possible bilateral lumbar facet RFA Diagnostic left caudal ESI + diagnostic epidurogram Possible Racz procedure Diagnostic bilateral SIjoint injection  Possible bilateral SI joint RFA Diagnostic left CESI  Diagnostic bilateral cervical facetblock  Possible bilateral cervical facet RFA   Palliative PRN treatment(s):   Diagnostic bilateral lumbar facet block Diagnostic bilateral IA hipjoint injection Diagnostic bilateral SIjoint  block    Recent Visits No visits were found meeting these conditions.  Showing recent visits within past 90 days and meeting all other requirements   Today's Visits Date Type Provider Dept  12/11/18 Office Visit [name], MD [facility]  Showing today's visits and meeting all other requirements   Future Appointments No visits were found meeting these conditions.  Showing future appointments within next 90 days and meeting all other requirements   I discussed the assessment and treatment plan with the patient. The patient was provided an opportunity to ask questions and all were answered. The patient agreed with the plan and demonstrated an understanding of the instructions.  Patient advised to call back or seek an in-person evaluation if the symptoms or condition worsens.  Total duration of non-face-to-face encounter: 15 minutes.  Note by: Gaspar Cola, MD Date: 12/11/2018; Time: 1:54 PM  Note: This dictation was prepared with Dragon dictation. Any transcriptional errors that may result from this process are unintentional.  Disclaimer:  * Given the special circumstances of the COVID-19 pandemic, the federal government has announced that the Office for Civil Rights (OCR) will exercise its enforcement discretion and will not impose penalties on physicians using telehealth in the event of noncompliance with regulatory requirements under the [place] (HIPAA) in connection with the good faith provision of telehealth during the TPNSQ-58 national public health emergency. ([place])

## 2018-12-11 ENCOUNTER — Ambulatory Visit: Payer: Medicare Other | Attending: Pain Medicine | Admitting: Pain Medicine

## 2018-12-11 ENCOUNTER — Other Ambulatory Visit: Payer: Self-pay

## 2018-12-11 DIAGNOSIS — M7918 Myalgia, other site: Secondary | ICD-10-CM

## 2018-12-11 DIAGNOSIS — M5137 Other intervertebral disc degeneration, lumbosacral region: Secondary | ICD-10-CM

## 2018-12-11 DIAGNOSIS — M79605 Pain in left leg: Secondary | ICD-10-CM | POA: Diagnosis not present

## 2018-12-11 DIAGNOSIS — Z79899 Other long term (current) drug therapy: Secondary | ICD-10-CM

## 2018-12-11 DIAGNOSIS — G894 Chronic pain syndrome: Secondary | ICD-10-CM

## 2018-12-11 DIAGNOSIS — Z85118 Personal history of other malignant neoplasm of bronchus and lung: Secondary | ICD-10-CM

## 2018-12-11 DIAGNOSIS — G629 Polyneuropathy, unspecified: Secondary | ICD-10-CM

## 2018-12-11 DIAGNOSIS — Z7189 Other specified counseling: Secondary | ICD-10-CM | POA: Insufficient documentation

## 2018-12-11 DIAGNOSIS — M899 Disorder of bone, unspecified: Secondary | ICD-10-CM

## 2018-12-11 DIAGNOSIS — M5442 Lumbago with sciatica, left side: Secondary | ICD-10-CM

## 2018-12-11 DIAGNOSIS — G893 Neoplasm related pain (acute) (chronic): Secondary | ICD-10-CM

## 2018-12-11 DIAGNOSIS — M961 Postlaminectomy syndrome, not elsewhere classified: Secondary | ICD-10-CM

## 2018-12-11 DIAGNOSIS — Z85528 Personal history of other malignant neoplasm of kidney: Secondary | ICD-10-CM

## 2018-12-11 DIAGNOSIS — Z789 Other specified health status: Secondary | ICD-10-CM

## 2018-12-11 DIAGNOSIS — M51379 Other intervertebral disc degeneration, lumbosacral region without mention of lumbar back pain or lower extremity pain: Secondary | ICD-10-CM

## 2018-12-11 DIAGNOSIS — K7031 Alcoholic cirrhosis of liver with ascites: Secondary | ICD-10-CM

## 2018-12-11 DIAGNOSIS — M533 Sacrococcygeal disorders, not elsewhere classified: Secondary | ICD-10-CM

## 2018-12-11 DIAGNOSIS — G8929 Other chronic pain: Secondary | ICD-10-CM

## 2018-12-11 MED ORDER — CYCLOBENZAPRINE HCL 5 MG PO TABS: 5.0000 mg | ORAL_TABLET | Freq: Three times a day (TID) | ORAL | 2 refills | Status: DC | PRN

## 2018-12-11 MED ORDER — OXYCODONE HCL 20 MG PO TABS: 1.0000 | ORAL_TABLET | Freq: Four times a day (QID) | ORAL | 0 refills | Status: DC

## 2018-12-11 NOTE — Patient Instructions (Signed)

## 2018-12-21 ENCOUNTER — Ambulatory Visit: Payer: Medicare Other | Admitting: Pain Medicine

## 2019-01-02 ENCOUNTER — Ambulatory Visit: Payer: Medicare Other | Admitting: Pain Medicine

## 2019-01-15 ENCOUNTER — Inpatient Hospital Stay: Payer: Medicare Other | Attending: Oncology

## 2019-01-15 ENCOUNTER — Other Ambulatory Visit: Payer: Medicare Other

## 2019-01-15 ENCOUNTER — Ambulatory Visit
Admission: RE | Admit: 2019-01-15 | Discharge: 2019-01-15 | Disposition: A | Discharge status: Home / Self Care | Payer: Medicare Other | Source: Ambulatory Visit | Attending: Oncology | Admitting: Oncology

## 2019-01-15 ENCOUNTER — Other Ambulatory Visit: Payer: Self-pay

## 2019-01-15 DIAGNOSIS — K219 Gastro-esophageal reflux disease without esophagitis: Secondary | ICD-10-CM | POA: Insufficient documentation

## 2019-01-15 DIAGNOSIS — Z8249 Family history of ischemic heart disease and other diseases of the circulatory system: Secondary | ICD-10-CM | POA: Insufficient documentation

## 2019-01-15 DIAGNOSIS — C641 Malignant neoplasm of right kidney, except renal pelvis: Secondary | ICD-10-CM | POA: Diagnosis present

## 2019-01-15 DIAGNOSIS — E119 Type 2 diabetes mellitus without complications: Secondary | ICD-10-CM | POA: Insufficient documentation

## 2019-01-15 DIAGNOSIS — M2550 Pain in unspecified joint: Secondary | ICD-10-CM | POA: Diagnosis not present

## 2019-01-15 DIAGNOSIS — G894 Chronic pain syndrome: Secondary | ICD-10-CM | POA: Insufficient documentation

## 2019-01-15 DIAGNOSIS — R918 Other nonspecific abnormal finding of lung field: Secondary | ICD-10-CM | POA: Insufficient documentation

## 2019-01-15 DIAGNOSIS — R5383 Other fatigue: Secondary | ICD-10-CM | POA: Diagnosis not present

## 2019-01-15 DIAGNOSIS — C642 Malignant neoplasm of left kidney, except renal pelvis: Secondary | ICD-10-CM | POA: Diagnosis present

## 2019-01-15 DIAGNOSIS — Z7984 Long term (current) use of oral hypoglycemic drugs: Secondary | ICD-10-CM | POA: Diagnosis not present

## 2019-01-15 DIAGNOSIS — J439 Emphysema, unspecified: Secondary | ICD-10-CM | POA: Insufficient documentation

## 2019-01-15 DIAGNOSIS — B182 Chronic viral hepatitis C: Secondary | ICD-10-CM | POA: Diagnosis not present

## 2019-01-15 DIAGNOSIS — F419 Anxiety disorder, unspecified: Secondary | ICD-10-CM | POA: Insufficient documentation

## 2019-01-15 DIAGNOSIS — I10 Essential (primary) hypertension: Secondary | ICD-10-CM | POA: Insufficient documentation

## 2019-01-15 DIAGNOSIS — Z79899 Other long term (current) drug therapy: Secondary | ICD-10-CM | POA: Diagnosis not present

## 2019-01-15 DIAGNOSIS — D509 Iron deficiency anemia, unspecified: Secondary | ICD-10-CM | POA: Insufficient documentation

## 2019-01-15 DIAGNOSIS — Z803 Family history of malignant neoplasm of breast: Secondary | ICD-10-CM | POA: Insufficient documentation

## 2019-01-15 DIAGNOSIS — F329 Major depressive disorder, single episode, unspecified: Secondary | ICD-10-CM | POA: Insufficient documentation

## 2019-01-15 DIAGNOSIS — C3411 Malignant neoplasm of upper lobe, right bronchus or lung: Secondary | ICD-10-CM | POA: Insufficient documentation

## 2019-01-15 DIAGNOSIS — F17210 Nicotine dependence, cigarettes, uncomplicated: Secondary | ICD-10-CM | POA: Insufficient documentation

## 2019-01-15 DIAGNOSIS — D500 Iron deficiency anemia secondary to blood loss (chronic): Secondary | ICD-10-CM

## 2019-01-15 LAB — COMPREHENSIVE METABOLIC PANEL
ALT: 25 U/L (ref 0–44)
AST: 35 U/L (ref 15–41)
Albumin: 4.3 g/dL (ref 3.5–5.0)
Alkaline Phosphatase: 118 U/L (ref 38–126)
Anion gap: 11 (ref 5–15)
BUN: 9 mg/dL (ref 6–20)
CO2: 23 mmol/L (ref 22–32)
Calcium: 9.7 mg/dL (ref 8.9–10.3)
Chloride: 94 mmol/L — ABNORMAL LOW (ref 98–111)
Creatinine, Ser: 0.71 mg/dL (ref 0.44–1.00)
GFR calc Af Amer: >60 mL/min (ref >60)
GFR calc non Af Amer: >60 mL/min (ref >60)
Glucose, Bld: 174 mg/dL — ABNORMAL HIGH (ref 70–99)
Potassium: 5 mmol/L (ref 3.5–5.1)
Sodium: 128 mmol/L — ABNORMAL LOW (ref 135–145)
Total Bilirubin: 0.8 mg/dL (ref 0.3–1.2)
Total Protein: 8.1 g/dL (ref 6.5–8.1)

## 2019-01-15 LAB — CBC WITH DIFFERENTIAL/PLATELET
Abs Immature Granulocytes: 0.05 10*3/uL (ref 0.00–0.07)
Basophils Absolute: 0 10*3/uL (ref 0.0–0.1)
Basophils Relative: 0 %
Eosinophils Absolute: 0.2 10*3/uL (ref 0.0–0.5)
Eosinophils Relative: 3 %
HCT: 39.1 % (ref 36.0–46.0)
Hemoglobin: 12.9 g/dL (ref 12.0–15.0)
Immature Granulocytes: 1 %
Lymphocytes Relative: 20 %
Lymphs Abs: 1.5 10*3/uL (ref 0.7–4.0)
MCH: 32.3 pg (ref 26.0–34.0)
MCHC: 33 g/dL (ref 30.0–36.0)
MCV: 98 fL (ref 80.0–100.0)
Monocytes Absolute: 0.9 10*3/uL (ref 0.1–1.0)
Monocytes Relative: 12 %
Neutro Abs: 4.7 10*3/uL (ref 1.7–7.7)
Neutrophils Relative %: 64 %
Platelets: 148 10*3/uL — ABNORMAL LOW (ref 150–400)
RBC: 3.99 MIL/uL (ref 3.87–5.11)
RDW: 13.7 % (ref 11.5–15.5)
WBC: 7.4 10*3/uL (ref 4.0–10.5)
nRBC: 0 % (ref 0.0–0.2)

## 2019-01-15 LAB — FERRITIN: Ferritin: 33 ng/mL (ref 11–307)

## 2019-01-15 LAB — IRON AND TIBC
Iron: 67 ug/dL (ref 28–170)
Saturation Ratios: 16 % (ref 10.4–31.8)
TIBC: 422 ug/dL (ref 250–450)
UIBC: 355 ug/dL

## 2019-01-18 ENCOUNTER — Other Ambulatory Visit: Payer: Self-pay

## 2019-01-18 ENCOUNTER — Encounter: Payer: Self-pay | Admitting: Oncology

## 2019-01-18 ENCOUNTER — Inpatient Hospital Stay: Payer: Medicare Other

## 2019-01-18 NOTE — Progress Notes (Signed)
Patient would like results.

## 2019-01-18 NOTE — Progress Notes (Signed)
Tumor Board Documentation  Audrey Garrett was presented by Dr Janese Banks at our Tumor Board on 01/18/2019, which included representatives from medical oncology, radiation oncology, pathology, radiology, surgical, navigation, internal medicine, pharmacy, pulmonology, research.  Audrey Garrett currently presents as a current patient, for discussion with history of the following treatments: neoadjuvant radiation.  Additionally, we reviewed previous medical and familial history, history of present illness, and recent lab results along with all available histopathologic and imaging studies. The tumor board considered available treatment options and made the following recommendations: Immunotherapy    The following procedures/referrals were also placed: No orders of the defined types were placed in this encounter.   Clinical Trial Status: not discussed   Staging used:    National site-specific guidelines   were discussed with respect to the case.  Tumor board is a meeting of clinicians from various specialty areas who evaluate and discuss patients for whom a multidisciplinary approach is being considered. Final determinations in the plan of care are those of the provider(s). The responsibility for follow up of recommendations given during tumor board is that of the provider.   Today's extended care, comprehensive team conference, Audrey Garrett was not present for the discussion and was not examined.   Multidisciplinary Tumor Board is a multidisciplinary case peer review process.  Decisions discussed in the Multidisciplinary Tumor Board reflect the opinions of the specialists present at the conference without having examined the patient.  Ultimately, treatment and diagnostic decisions rest with the primary provider(s) and the patient.

## 2019-01-19 ENCOUNTER — Inpatient Hospital Stay (HOSPITAL_BASED_OUTPATIENT_CLINIC_OR_DEPARTMENT_OTHER): Payer: Medicare Other | Admitting: Oncology

## 2019-01-19 ENCOUNTER — Other Ambulatory Visit: Payer: Self-pay

## 2019-01-19 VITALS — BP 113/78 | HR 86 | Temp 98.1°F

## 2019-01-19 DIAGNOSIS — C649 Malignant neoplasm of unspecified kidney, except renal pelvis: Secondary | ICD-10-CM | POA: Diagnosis not present

## 2019-01-19 DIAGNOSIS — C3411 Malignant neoplasm of upper lobe, right bronchus or lung: Secondary | ICD-10-CM

## 2019-01-19 DIAGNOSIS — D509 Iron deficiency anemia, unspecified: Secondary | ICD-10-CM

## 2019-01-21 NOTE — Progress Notes (Signed)
Hematology/Oncology Consult note [facility]  Telephone:([phone] [phone]  Patient Care Team: Jodi Marble, MD as PCP - General (Internal Medicine)   Name of the patient: Audrey Garrett  [id number]  Apr 28, 1960   Date of visit: 01/21/19  Diagnosis- 1. Iron deficiency anemia  2. Renal cell carcinoma  3. Stage I lung cancer [id number]  4. Bilateral lung nodule- ? Low grade multifocal adenocarcinoma  Chief complaint/ Reason for visit- discuss ct scan results  Heme/Onc history: Patient is a 58 year old female with a h/o liver cirrhosis and is currently receiving Harvoni for hepatitis C. GI evaluation is as follows:  Upper EGD on 04/01/16 showed multiple dispersed diminutive erosions were found in the gastric antrum. There were no stigmata of recent bleeding. One healing cratered gastric ulcer with no stigmata of bleeding was found in the prepyloric region of the stomach. The lesion was 2 mm in largest dimension. Colonoscopy on 04/01/16 showed multiple small-mouthed diverticula were found in the sigmoid colon and descending colon. A single small localized angioectasia with typical arborization was found in the proximal ascending colon. Coagulation for tissue destruction using argon plasma at 0.5 liters/minute and 20 watts was successful.  She has received significant blood transfusions recently since March 2017. Her last blood transfusion was on 03/17/16. She had 2 units of blood She received 4 doses of IV Venofer  In Jan 2018 she received blood trasnsfusion followed by ferraheme.   During her visit in February 2018 her H&H was 10.3/ 31.6. IV iron was held off and she was supposed to get cbc checked in1 month. Patient missed that appointment and became progressively iron deficient again when her hb dropped to 7.9. She received 1 unit of blood transfusion  Patient continued to feel fatigued despite improvement in her anemia. Given her strong h/o  smoking CT lung cancer screenign was obtained which showed: IMPRESSION: 11.6 mm spiculated right lung nodule just anterior to the major fissure with associated fissure all retraction. Lung-RADS Category 4A, suspicious. Follow up low-dose chest CT without contrast in 3 months (please use the following order, "CT CHEST LCS NODULE FOLLOW-UP W/O CM") is recommended. Alternatively, PET may be considered when there is a solid component 67mm or larger.  Other bilateral pulmonary nodules evident. Attention on follow-up.   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 She did not wish to pursue surgery  She sees DR. Erlene Quan for her renal mass. 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 Feels fatigued.  ECOG PS- 1 Pain scale- 4   Review of systems- Review of Systems  Constitutional: Positive for malaise/fatigue.  Musculoskeletal: Positive for joint pain.       No Known Allergies   Past Medical History:  Diagnosis Date   Alcohol abuse    Alcoholic cirrhosis ([place])    GI-- dr skulskie/  hx ascites 2017    Anxiety    Chronic hepatitis C ([place])    treated w/ harvoni(2018)   Chronic LBP 12/25/2014   Overview:  Post extensive surgery    Chronic pain syndrome    neck , upper back, post extensive spinal surgery   DDD (degenerative disc disease), lumbar    Diverticulosis    Emphysema lung ([place])    Fibromyalgia    Full  dentures    partials, upper and lower   GERD (gastroesophageal reflux disease)    gastroparesis   Hiatal hernia    History of GI bleed    06/ 2017  ulcers and gastric erosions, transfused x4/  10/ 2017 unknown source/  01/ 2018  transfused x2 units and IV Iron    Hypertension    Iron deficiency anemia due to chronic blood loss     hemotologist-- dr Alvy Bimler   Lumbar facet joint syndrome    Lumbar spondylosis    Lung cancer ([place]) 11/25/2016   right upper lobe nodule , Stage I,  treatment SBRT   MDD (major depressive disorder)    Nephrolithiasis    per CT 09-30-2017 right side nonobstructive   OA (osteoarthritis of spine)    Polyarthritis    Polyneuropathy    Restless leg syndrome    Right renal mass    RLS (restless legs syndrome)    SOB (shortness of breath)    Type 2 diabetes mellitus ([place])    followed by pcp   Urinary frequency    Vitamin D deficiency disease      Past Surgical History:  Procedure Laterality Date   COLONOSCOPY WITH PROPOFOL N/A 09/25/2015   Procedure: COLONOSCOPY WITH PROPOFOL;  Surgeon: Lollie Sails, MD;  Location: [facility] ENDOSCOPY;  Service: Endoscopy;  Laterality: N/A;   COLONOSCOPY WITH PROPOFOL N/A 04/01/2016   Procedure: COLONOSCOPY WITH PROPOFOL;  Surgeon: Lollie Sails, MD;  Location: [facility] ENDOSCOPY;  Service: Endoscopy;  Laterality: N/A;   ELECTROMAGNETIC NAVIGATION BROCHOSCOPY N/A 11/25/2016   Procedure: ELECTROMAGNETIC NAVIGATION BRONCHOSCOPY;  Surgeon: Flora Lipps, MD;  Location: [facility] ORS;  Service: Cardiopulmonary;  Laterality: N/A;   ENDOMETRIAL ABLATION  2007   ESOPHAGOGASTRODUODENOSCOPY N/A 04/14/2015   Procedure: ESOPHAGOGASTRODUODENOSCOPY (EGD);  Surgeon: Lollie Sails, MD;  Location: [facility] ENDOSCOPY;  Service: Endoscopy;  Laterality: N/A;   ESOPHAGOGASTRODUODENOSCOPY (EGD) WITH PROPOFOL N/A 09/16/2015   Procedure: ESOPHAGOGASTRODUODENOSCOPY (EGD) WITH PROPOFOL;  Surgeon: Lollie Sails, MD;  Location: [facility] ENDOSCOPY;  Service: Endoscopy;  Laterality: N/A;   ESOPHAGOGASTRODUODENOSCOPY (EGD) WITH PROPOFOL N/A 01/22/2016   Procedure: ESOPHAGOGASTRODUODENOSCOPY (EGD) WITH PROPOFOL;  Surgeon: [name], MD;  Location: [place];  Service: Endoscopy;  Laterality: N/A;   ESOPHAGOGASTRODUODENOSCOPY (EGD) WITH PROPOFOL N/A 04/01/2016    Procedure: ESOPHAGOGASTRODUODENOSCOPY (EGD) WITH PROPOFOL;  Surgeon: Lollie Sails, MD;  Location: [facility] ENDOSCOPY;  Service: Endoscopy;  Laterality: N/A;   FOOT ARTHRODESIS Right 04/06/2018   Procedure: ARTHRODESIS - HALLUX/IPJOINT;  Surgeon: [name], DPM;  Location: [place];  Service: Podiatry;  Laterality: Right;  HAMMERLOCK IMPLANTS PARAGON 4-0 SCREW LMA LOCAL Diabetic - oral meds   IR RADIOLOGIST EVAL & MGMT  04/13/2017   IR RADIOLOGIST EVAL & MGMT  11/15/2017   RADIOLOGY WITH ANESTHESIA Right 12/14/2017   Procedure: CT MICROWAVE THERMAL ABLATION AND BIOPSY WITH ANESTHESIA;  Surgeon: Markus Daft, MD;  Location: [facility] ORS;  Service: Anesthesiology;  Laterality: Right;   SPINAL FUSION  12/19/2011   T5 to sacrum (rod, screws)   TONSILLECTOMY  age 61   TUBAL LIGATION Bilateral yrs ago   [place] N/A 11/06/2015   Procedure: HERNIA REPAIR UMBILICAL ADULT;  Surgeon: [name], MD;  Location: [facility] ORS;  Service: General;  Laterality: N/A;    Social History   Socioeconomic History   Marital status: Married    Spouse name: Not on file   Number of children: Not on file   Years of education: Not  on file   Highest education level: Not on file  Occupational History   Not on file  Social Needs   Financial resource strain: Not on file   Food insecurity    Worry: Not on file    Inability: Not on file   Transportation needs    Medical: Not on file    Non-medical: Not on file  Tobacco Use   Smoking status: Current Every Day Smoker    Packs/day: 0.50    Years: 45.00    Pack years: 22.50    Types: Cigarettes   Smokeless tobacco: Never Used   Tobacco comment: 12-05-2017 2ppd until 2017 down to 1/2 ppd  Substance and Sexual Activity   Alcohol use: Not Currently    Alcohol/week: 0.0 standard drinks    Comment: did drink 12 pack beer per week (7 oz each)   Drug use: No   Sexual activity: Not on file  Lifestyle   Physical  activity    Days per week: Not on file    Minutes per session: Not on file   Stress: Not on file  Relationships   Social connections    Talks on phone: Not on file    Gets together: Not on file    Attends religious service: Not on file    Active member of club or organization: Not on file    Attends meetings of clubs or organizations: Not on file    Relationship status: Not on file   Intimate partner violence    Fear of current or ex partner: Not on file    Emotionally abused: Not on file    Physically abused: Not on file    Forced sexual activity: Not on file  Other Topics Concern   Not on file  Social History Narrative   Lives at home with husband    Family History  Problem Relation Age of Onset   Stroke Mother    Heart disease Mother    [name] Father    Breast cancer Sister 42   Kidney disease Neg Hx    Bladder Cancer Neg Hx    Kidney cancer Neg Hx    Prostate cancer Neg Hx      Current Outpatient Medications:    cyclobenzaprine (FLEXERIL) 5 MG tablet, Take 1 tablet (5 mg total) by mouth 3 (three) times daily as needed for muscle spasms., Disp: 90 tablet, Rfl: 2   escitalopram (LEXAPRO) 10 MG tablet, Take 10 mg by mouth daily. , Disp: , Rfl:    furosemide (LASIX) 40 MG tablet, Take 40 mg by mouth 2 (two) times daily. , Disp: , Rfl:    metFORMIN (GLUCOPHAGE) 850 MG tablet, Take 850 mg by mouth 2 (two) times daily., Disp: , Rfl:    OZEMPIC 1 MG/DOSE SOPN, Inject 1 mg into the skin every Tuesday., Disp: , Rfl:    pantoprazole (PROTONIX) 40 MG tablet, Take 40 mg by mouth 2 (two) times daily. , Disp: , Rfl: 11   pregabalin (LYRICA) 200 MG capsule, Take 200 mg by mouth 2 (two) times daily., Disp: , Rfl:    spironolactone (ALDACTONE) 50 MG tablet, Take 100 mg by mouth daily. , Disp: , Rfl:    Multiple Vitamin (MULTIVITAMIN WITH MINERALS) TABS tablet, Take 1 tablet by mouth daily., Disp: , Rfl:    Oxycodone HCl 20 MG TABS, Take 1 tablet (20 mg total)  by mouth every 6 (six) hours. Must last 30 days (Patient not taking: Reported on 01/18/2019), Disp:  120 tablet, Rfl: 0  Physical exam:  Vitals:   01/19/19 0955  BP: 113/78  Pulse: 86  Temp: 98.1 F (36.7 C)  TempSrc: Tympanic   Physical Exam Constitutional:      General: She is not in acute distress.    Comments: B/l leg brces in place and ambulates with a walker  HENT:     Head: Normocephalic and atraumatic.  Eyes:     Pupils: Pupils are equal, round, and reactive to light.  Neck:     Musculoskeletal: Normal range of motion.  Cardiovascular:     Rate and Rhythm: Normal rate and regular rhythm.     Heart sounds: Normal heart sounds.  Pulmonary:     Effort: Pulmonary effort is normal.     Breath sounds: Normal breath sounds.  Abdominal:     General: Bowel sounds are normal.     Palpations: Abdomen is soft.  Skin:    General: Skin is warm and dry.  Neurological:     Mental Status: She is alert and oriented to person, place, and time.      CMP Latest Ref Rng & Units 01/15/2019  Glucose 70 - 99 mg/dL 174(H)  BUN 6 - 20 mg/dL 9  Creatinine 0.44 - 1.00 mg/dL 0.71  Sodium 135 - 145 mmol/L 128(L)  Potassium 3.5 - 5.1 mmol/L 5.0  Chloride 98 - 111 mmol/L 94(L)  CO2 22 - 32 mmol/L 23  Calcium 8.9 - 10.3 mg/dL 9.7  Total Protein 6.5 - 8.1 g/dL 8.1  Total Bilirubin 0.3 - 1.2 mg/dL 0.8  Alkaline Phos 38 - 126 U/L 118  AST 15 - 41 U/L 35  ALT 0 - 44 U/L 25   CBC Latest Ref Rng & Units 01/15/2019  WBC 4.0 - 10.5 K/uL 7.4  Hemoglobin 12.0 - 15.0 g/dL 12.9  Hematocrit 36.0 - 46.0 % 39.1  Platelets 150 - 400 K/uL 148(L)    No images are attached to the encounter.  Ct Super D Chest Wo Contrast  Result Date: 01/15/2019 CLINICAL DATA:  Lung nodules.  Lung cancer.  Renal cell carcinoma. EXAM: CT CHEST WITHOUT CONTRAST TECHNIQUE: Multidetector CT imaging of the chest was performed using thin slice collimation for electromagnetic bronchoscopy planning purposes, without  intravenous contrast. COMPARISON:  Multiple prior chest CT exams dating back to 08/03/2016. FINDINGS: Cardiovascular: Atherosclerotic calcification of the aorta. Heart size normal. No pericardial effusion. Mediastinum/Nodes: No pathologically enlarged mediastinal or axillary lymph nodes. Hilar regions are difficult to definitively evaluate without IV contrast. Esophagus is unremarkable. Lungs/Pleura: Centrilobular emphysema. Smoking related respiratory bronchiolitis. Part solid nodule in the posterior segment right upper lobe is seen along the periphery of a focal bed of emphysema and overall, measures 2.0 x 2.3 cm (5/137). The internal solid component measures 9 mm. Solid component is new when compared with 08/03/2016. Post treatment architectural distortion in the posterior segment right upper lobe and right perihilar region, similar to 10/04/2018. 7 mm solid nodule in the apical left upper lobe (5/61) and 6 mm nodule in the lingula (5/174) are similar to 08/03/2016. A solid nodule in the anterior left upper lobe measures 5 x 8 mm (5/102), similar to 10/04/2018 but new from 08/03/2016. Multiple additional areas of fairly amorphous ground-glass nodularity in the lungs bilaterally. No pleural fluid. Airway is unremarkable. Upper Abdomen: Liver margin is slightly irregular. Visualized portions of the adrenal glands, kidneys, spleen, pancreas, stomach and bowel are unremarkable. No upper abdominal adenopathy. Musculoskeletal: Postoperative changes in the thoracic spine. Irregularity/lucency  within the left humeral head is incompletely imaged. IMPRESSION: 1. Part solid nodule in the posterior segment right upper lobe. Solid component is new compared with 08/03/2016. Findings are highly worrisome for adenocarcinoma. 2. Solid nodule in the anterior left upper lobe, new from 08/03/2016, also worrisome for adenocarcinoma. 3. Multiple additional areas of ground-glass nodularity bilaterally. Continued attention on follow-up  exams for development of solid components is recommended as indolent adenocarcinoma cannot be excluded. 4. Liver appears cirrhotic. 5. Irregularity/lucency within the left humeral head is incompletely imaged. Difficult to exclude a fracture. Please correlate clinically. 6.  Aortic atherosclerosis (ICD10-170.0). 7.  Emphysema (ICD10-J43.9). Electronically Signed   By: Lorin Picket M.D.   On: 01/15/2019 10:36     Assessment and plan- Patient is a 58 y.o. female with following issues:  1.bilateral lung nodules- these have been slow growing and most of them have a ground glass appearance. The rul has a more solid component to it. LUL is stable since July 2020 but new since 2018. She had a biopsy back in 2018 of rul nodule that was adenocarcinoma and she received sbrt.we discussed her case at tumor board. Repeat biopsy at this time is risk given the location and risk of pneumothorax. It is likely the same adenocarcinoma that she had in 2018.   Discussed management options including initiating immunotherapy until progression or toxicity versus continued surveillance. Patient would like to defer treatment as much as possible and is certainly not interested in pursuing chemotherapy. We mutually agreed to repeat scans in 6 months  2. Renal cell carcinoma- she follows up with urology for this  3. Iron deficiency anemia- iron studies are normal. She does not need iv iron at this time  Labs in 3 and 6 months. See md in 6 months. Scans prior   Visit Diagnosis 1. Malignant neoplasm of upper lobe of right lung ([place])   2. Malignant neoplasm of kidney, unspecified laterality ([place])   3. Iron deficiency anemia, unspecified iron deficiency anemia type      Dr. Randa Evens, MD, MPH [facility] [phone] 01/21/2019 6:15 PM

## 2019-01-26 DIAGNOSIS — M14671 Charcot's joint, right ankle and foot: Secondary | ICD-10-CM | POA: Insufficient documentation

## 2019-02-16 ENCOUNTER — Other Ambulatory Visit: Payer: Self-pay | Admitting: Gastroenterology

## 2019-02-16 ENCOUNTER — Other Ambulatory Visit: Payer: Self-pay | Admitting: Pain Medicine

## 2019-02-16 DIAGNOSIS — K7030 Alcoholic cirrhosis of liver without ascites: Secondary | ICD-10-CM

## 2019-02-16 DIAGNOSIS — G8929 Other chronic pain: Secondary | ICD-10-CM

## 2019-02-16 DIAGNOSIS — M7918 Myalgia, other site: Secondary | ICD-10-CM

## 2019-02-19 ENCOUNTER — Ambulatory Visit: Payer: Medicare Other | Admitting: Oncology

## 2019-03-06 ENCOUNTER — Encounter: Payer: Self-pay | Admitting: Pain Medicine

## 2019-03-07 ENCOUNTER — Other Ambulatory Visit: Payer: Self-pay

## 2019-03-07 ENCOUNTER — Ambulatory Visit: Payer: Medicare Other | Attending: Pain Medicine | Admitting: Pain Medicine

## 2019-03-07 ENCOUNTER — Telehealth: Payer: Self-pay

## 2019-03-07 DIAGNOSIS — M961 Postlaminectomy syndrome, not elsewhere classified: Secondary | ICD-10-CM

## 2019-03-07 DIAGNOSIS — M7918 Myalgia, other site: Secondary | ICD-10-CM

## 2019-03-07 DIAGNOSIS — G894 Chronic pain syndrome: Secondary | ICD-10-CM | POA: Diagnosis not present

## 2019-03-07 DIAGNOSIS — G893 Neoplasm related pain (acute) (chronic): Secondary | ICD-10-CM | POA: Diagnosis not present

## 2019-03-07 DIAGNOSIS — M14672 Charcot's joint, left ankle and foot: Secondary | ICD-10-CM

## 2019-03-07 DIAGNOSIS — Z85118 Personal history of other malignant neoplasm of bronchus and lung: Secondary | ICD-10-CM

## 2019-03-07 DIAGNOSIS — M14671 Charcot's joint, right ankle and foot: Secondary | ICD-10-CM | POA: Diagnosis not present

## 2019-03-07 DIAGNOSIS — G8929 Other chronic pain: Secondary | ICD-10-CM

## 2019-03-07 DIAGNOSIS — Z85528 Personal history of other malignant neoplasm of kidney: Secondary | ICD-10-CM

## 2019-03-07 MED ORDER — OXYCODONE HCL 20 MG PO TABS: 1.0000 | ORAL_TABLET | Freq: Four times a day (QID) | ORAL | 0 refills | Status: DC

## 2019-03-07 MED ORDER — CYCLOBENZAPRINE HCL 5 MG PO TABS: 5.0000 mg | ORAL_TABLET | Freq: Three times a day (TID) | ORAL | 5 refills | Status: DC | PRN

## 2019-03-07 NOTE — Telephone Encounter (Signed)
Patient informed that she  needs to call pharmacy and check on medication. Had a virtual appt with Dr. Dossie Arbour today.

## 2019-03-07 NOTE — Telephone Encounter (Signed)
She states she got a text from [facility] saying her flexoril wasn't authorized.

## 2019-03-07 NOTE — Progress Notes (Signed)
Pain Management Virtual Encounter Note - Virtual Visit via Telephone Telehealth (real-time audio visits between healthcare provider and patient).   [name] Phone No. & Preferred Pharmacy:  [phone] (home); [phone] (mobile); (Preferred) [phone] [email or web address]  [id number] [name], [facility] [place] [address] [place] [address] Phone: [phone] Fax: [phone]    Pre-screening note:  Our staff contacted Audrey Garrett and offered her an "in person", "face-to-face" appointment versus a telephone encounter. She indicated preferring the telephone encounter, at this time.   Reason for Virtual Visit: COVID-19*  Social distancing based on CDC and AMA recommendations.   I contacted Audrey Garrett on 03/07/2019 via telephone.      I clearly identified myself as Gaspar Cola, MD. I verified that I was speaking with the correct person using two identifiers (Name: Audrey Garrett, and date of birth: October 27, 1960).  Advanced Informed Consent I sought verbal advanced consent from Audrey Garrett for virtual visit interactions. I informed Audrey Garrett of possible security and privacy concerns, risks, and limitations associated with providing "not-in-person" medical evaluation and management services. I also informed Audrey Garrett of the availability of "in-person" appointments. Finally, I informed her that there would be a charge for the virtual visit and that she could be  personally, fully or partially, financially responsible for it. Audrey Garrett expressed understanding and agreed to proceed.   Historic Elements   Audrey Garrett is a 58 y.o. year old, female patient evaluated today after her last encounter by our practice on 02/16/2019. Audrey Garrett  has a past medical history of Alcohol abuse, Alcoholic cirrhosis ([place]), Anxiety, Chronic hepatitis C ([place]), Chronic LBP (12/25/2014), Chronic pain syndrome, DDD (degenerative disc disease), lumbar, Diverticulosis,  Emphysema lung ([place]), Fibromyalgia, Full dentures, GERD (gastroesophageal reflux disease), Hiatal hernia, History of GI bleed, Hypertension, Iron deficiency anemia due to chronic blood loss, Lumbar facet joint syndrome, Lumbar spondylosis, Lung cancer ([place]) (11/25/2016), MDD (major depressive disorder), Nephrolithiasis, OA (osteoarthritis of spine), Polyarthritis, Polyneuropathy, Restless leg syndrome, Right renal mass, RLS (restless legs syndrome), SOB (shortness of breath), Type 2 diabetes mellitus ([place]), Urinary frequency, and Vitamin D deficiency disease. She also  has a past surgical history that includes Esophagogastroduodenoscopy (N/A, 04/14/2015); Esophagogastroduodenoscopy (egd) with propofol (N/A, 09/16/2015); Colonoscopy with propofol (N/A, 09/25/2015); Umbilical hernia repair (N/A, 11/06/2015); Esophagogastroduodenoscopy (egd) with propofol (N/A, 01/22/2016); Colonoscopy with propofol (N/A, 04/01/2016); Esophagogastroduodenoscopy (egd) with propofol (N/A, 04/01/2016); Electormagnetic navigation bronchoscopy (N/A, 11/25/2016); IR Radiologist Eval & Mgmt (04/13/2017); IR Radiologist Eval & Mgmt (11/15/2017); Spinal fusion (12/19/2011); Endometrial ablation (2007); Tubal ligation (Bilateral, yrs ago); Tonsillectomy (age 67); Radiology with anesthesia (Right, 12/14/2017); and Foot arthrodesis (Right, 04/06/2018). Audrey Garrett has a current medication list which includes the following prescription(s): escitalopram, furosemide, metformin, multivitamin with minerals, ozempic (1 mg/dose), pantoprazole, pregabalin, spironolactone, cyclobenzaprine, oxycodone hcl, oxycodone hcl, and oxycodone hcl. She  reports that she has been smoking cigarettes. She has a 22.50 pack-year smoking history. She has never used smokeless tobacco. She reports previous alcohol use. She reports that she does not use drugs. Audrey Garrett has No Known Allergies.   HPI  Today, she is being contacted for medication management.  The patient indicates doing  well with the current medication regimen. No adverse reactions or side effects reported to the medications.  She still dealing with the cast in both legs and she has been seen by orthopedics at [facility].  She wishes that she could be doing better, but overall she is stable on her medications.  Pharmacotherapy Assessment  Analgesic: Oxycodone IR 20 mg, 1 tab PO q 6 hrs(80 mg/day of oxycodone) MME/day:120 mg/day.   Monitoring: Pharmacotherapy: No side-effects or adverse reactions reported. [place] PMP: PDMP reviewed during this encounter.       Compliance: No problems identified. Effectiveness: Clinically acceptable. Plan: Refer to "POC".  UDS:  Summary  Date Value Ref Range Status  12/01/2017 FINAL  Final    Comment:    ==================================================================== TOXASSURE SELECT 13 (MW) ==================================================================== Test                             Result       Flag       Units Drug Present and Declared for Prescription Verification   Oxycodone                      1004         EXPECTED   ng/mg creat   Oxymorphone                    3423         EXPECTED   ng/mg creat   Noroxycodone                   6665         EXPECTED   ng/mg creat   Noroxymorphone                 3850         EXPECTED   ng/mg creat    Sources of oxycodone are scheduled prescription medications.    Oxymorphone, noroxycodone, and noroxymorphone are expected    metabolites of oxycodone. Oxymorphone is also available as a    scheduled prescription medication. ==================================================================== Test                      Result    Flag   Units      Ref Range   Creatinine              26               mg/dL      >=20 ==================================================================== Declared Medications:  The flagging and interpretation on this report are based on the  following declared medications.  Unexpected results  may arise from  inaccuracies in the declared medications.  **Note: The testing scope of this panel includes these medications:  Oxycodone  **Note: The testing scope of this panel does not include following  reported medications:  Cyclobenzaprine  Escitalopram (Lexapro)  Furosemide (Lasix)  Lactulose  Metformin  Multivitamin  Pantoprazole (Protonix)  Pregabalin (Lyrica)  Spironolactone (Aldactone)  Vitamin B1 ==================================================================== For clinical consultation, please call [phone]. ====================================================================    Laboratory Chemistry Profile (12 mo)  Renal: 01/15/2019: BUN 9; Creatinine, Ser 0.71  Lab Results  Component Value Date   GFRAA >60 01/15/2019   GFRNONAA >60 01/15/2019   Hepatic: 01/15/2019: Albumin 4.3 Lab Results  Component Value Date   AST 35 01/15/2019   ALT 25 01/15/2019   Other: No results found for requested labs within last 8760 hours. Note: Above Lab results reviewed.  Imaging  CT Super D Chest Wo Contrast CLINICAL DATA:  Lung nodules.  Lung cancer.  Renal cell carcinoma.  EXAM: CT CHEST WITHOUT CONTRAST  TECHNIQUE: Multidetector CT imaging of the chest was performed using thin slice collimation for electromagnetic bronchoscopy planning  purposes, without intravenous contrast.  COMPARISON:  Multiple prior chest CT exams dating back to 08/03/2016.  FINDINGS: Cardiovascular: Atherosclerotic calcification of the aorta. Heart size normal. No pericardial effusion.  Mediastinum/Nodes: No pathologically enlarged mediastinal or axillary lymph nodes. Hilar regions are difficult to definitively evaluate without IV contrast. Esophagus is unremarkable.  Lungs/Pleura: Centrilobular emphysema. Smoking related respiratory bronchiolitis. Part solid nodule in the posterior segment right upper lobe is seen along the periphery of a focal bed of emphysema and overall,  measures 2.0 x 2.3 cm (5/137). The internal solid component measures 9 mm. Solid component is new when compared with 08/03/2016. Post treatment architectural distortion in the posterior segment right upper lobe and right perihilar region, similar to 10/04/2018. 7 mm solid nodule in the apical left upper lobe (5/61) and 6 mm nodule in the lingula (5/174) are similar to 08/03/2016. A solid nodule in the anterior left upper lobe measures 5 x 8 mm (5/102), similar to 10/04/2018 but new from 08/03/2016. Multiple additional areas of fairly amorphous ground-glass nodularity in the lungs bilaterally. No pleural fluid. Airway is unremarkable.  Upper Abdomen: Liver margin is slightly irregular. Visualized portions of the adrenal glands, kidneys, spleen, pancreas, stomach and bowel are unremarkable. No upper abdominal adenopathy.  Musculoskeletal: Postoperative changes in the thoracic spine. Irregularity/lucency within the left humeral head is incompletely imaged.  IMPRESSION: 1. Part solid nodule in the posterior segment right upper lobe. Solid component is new compared with 08/03/2016. Findings are highly worrisome for adenocarcinoma. 2. Solid nodule in the anterior left upper lobe, new from 08/03/2016, also worrisome for adenocarcinoma. 3. Multiple additional areas of ground-glass nodularity bilaterally. Continued attention on follow-up exams for development of solid components is recommended as indolent adenocarcinoma cannot be excluded. 4. Liver appears cirrhotic. 5. Irregularity/lucency within the left humeral head is incompletely imaged. Difficult to exclude a fracture. Please correlate clinically. 6.  Aortic atherosclerosis (ICD10-170.0). 7.  Emphysema (ICD10-J43.9).  Electronically Signed   By: Lorin Picket M.D.   On: 01/15/2019 10:36   Assessment  The primary encounter diagnosis was Cancer-related pain. Diagnoses of Chronic pain syndrome, Charcot's joint of left foot,  Charcot's joint, right ankle and foot, Failed back surgical syndrome, Chronic musculoskeletal pain, H/O malignant neoplasm of lung, and H/O renal cell cancer were also pertinent to this visit.  Plan of Care  Problem-specific:  No problem-specific Assessment & Plan notes found for this encounter.  I am having Audrey Garrett start on Oxycodone HCl and Oxycodone HCl. I am also having her maintain her furosemide, pantoprazole, spironolactone, escitalopram, Ozempic (1 MG/DOSE), pregabalin, multivitamin with minerals, metFORMIN, cyclobenzaprine, and Oxycodone HCl.  Pharmacotherapy (Medications Ordered): Meds ordered this encounter  Medications  . cyclobenzaprine (FLEXERIL) 5 MG tablet    Sig: Take 1 tablet (5 mg total) by mouth 3 (three) times daily as needed for muscle spasms.    Dispense:  90 tablet    Refill:  5    Fill one day early if pharmacy is closed on scheduled refill date. May substitute for generic if available.  . Oxycodone HCl 20 MG TABS    Sig: Take 1 tablet (20 mg total) by mouth every 6 (six) hours. Must last 30 days    Dispense:  120 tablet    Refill:  0    Chronic Pain: STOP Act (Not applicable) Fill 1 day early if closed on refill date. Do not fill until: 03/13/2019. To last until: 04/12/2019. Avoid benzodiazepines within 8 hours of opioids  . Oxycodone HCl 20  MG TABS    Sig: Take 1 tablet (20 mg total) by mouth every 6 (six) hours. Must last 30 days    Dispense:  120 tablet    Refill:  0    Chronic Pain: STOP Act (Not applicable) Fill 1 day early if closed on refill date. Do not fill until: 04/12/2019. To last until: 05/12/2019. Avoid benzodiazepines within 8 hours of opioids  . Oxycodone HCl 20 MG TABS    Sig: Take 1 tablet (20 mg total) by mouth every 6 (six) hours. Must last 30 days    Dispense:  120 tablet    Refill:  0    Chronic Pain: STOP Act (Not applicable) Fill 1 day early if closed on refill date. Do not fill until: 05/12/2019. To last until: 06/11/2019. Avoid  benzodiazepines within 8 hours of opioids   Orders:  No orders of the defined types were placed in this encounter.  Follow-up plan:   Return in about 3 months (around 06/11/2019) for (VV), (MM).      Interventional management options:  Considering:   NOTE: Hx of Thrombocytopenia  Diagnostic bilateral lumbar facetblock  Possible bilateral lumbar facet RFA Diagnostic left caudal ESI + diagnostic epidurogram Possible Racz procedure Diagnostic bilateral SIjoint injection  Possible bilateral SI joint RFA Diagnostic left CESI  Diagnostic bilateral cervical facetblock  Possible bilateral cervical facet RFA   Palliative PRN treatment(s):   Diagnostic bilateral lumbar facet block Diagnostic bilateral IA hipjoint injection Diagnostic bilateral SIjoint block     Recent Visits Date Type Provider Dept  12/11/18 Office Visit [name], MD [facility]  Showing recent visits within past 90 days and meeting all other requirements   Today's Visits Date Type Provider Dept  03/07/19 Telemedicine [name], MD [facility]  Showing today's visits and meeting all other requirements   Future Appointments No visits were found meeting these conditions.  Showing future appointments within next 90 days and meeting all other requirements   I discussed the assessment and treatment plan with the patient. The patient was provided an opportunity to ask questions and all were answered. The patient agreed with the plan and demonstrated an understanding of the instructions.  Patient advised to call back or seek an in-person evaluation if the symptoms or condition worsens.  Total duration of non-face-to-face encounter: 13 minutes.  Note by: Gaspar Cola, MD Date: 03/07/2019; Time: 1:38 PM  Note: This dictation was prepared with Dragon dictation. Any transcriptional errors that may result from this process are unintentional.  Disclaimer:  * Given  the special circumstances of the COVID-19 pandemic, the federal government has announced that the Office for Civil Rights (OCR) will exercise its enforcement discretion and will not impose penalties on physicians using telehealth in the event of noncompliance with regulatory requirements under the [place] and [place] (HIPAA) in connection with the good faith provision of telehealth during the KKXFG-18 national public health emergency. ([place])

## 2019-03-13 ENCOUNTER — Ambulatory Visit: Payer: Medicare Other | Admitting: Pain Medicine

## 2019-04-09 ENCOUNTER — Ambulatory Visit
Admission: RE | Admit: 2019-04-09 | Discharge: 2019-04-09 | Disposition: A | Discharge status: Home / Self Care | Payer: Medicare Other | Source: Ambulatory Visit | Attending: Gastroenterology | Admitting: Gastroenterology

## 2019-04-09 ENCOUNTER — Other Ambulatory Visit: Payer: Self-pay

## 2019-04-09 DIAGNOSIS — K7030 Alcoholic cirrhosis of liver without ascites: Secondary | ICD-10-CM | POA: Diagnosis not present

## 2019-04-20 ENCOUNTER — Inpatient Hospital Stay: Payer: Medicare Other

## 2019-04-23 ENCOUNTER — Other Ambulatory Visit: Payer: Self-pay

## 2019-04-24 ENCOUNTER — Inpatient Hospital Stay: Payer: Medicare Other | Attending: Oncology

## 2019-04-24 ENCOUNTER — Other Ambulatory Visit: Payer: Self-pay

## 2019-04-24 ENCOUNTER — Telehealth: Payer: Self-pay

## 2019-04-24 DIAGNOSIS — C3411 Malignant neoplasm of upper lobe, right bronchus or lung: Secondary | ICD-10-CM

## 2019-04-24 DIAGNOSIS — Z85528 Personal history of other malignant neoplasm of kidney: Secondary | ICD-10-CM | POA: Diagnosis not present

## 2019-04-24 DIAGNOSIS — Z79899 Other long term (current) drug therapy: Secondary | ICD-10-CM | POA: Insufficient documentation

## 2019-04-24 DIAGNOSIS — D509 Iron deficiency anemia, unspecified: Secondary | ICD-10-CM | POA: Insufficient documentation

## 2019-04-24 DIAGNOSIS — F17210 Nicotine dependence, cigarettes, uncomplicated: Secondary | ICD-10-CM | POA: Insufficient documentation

## 2019-04-24 LAB — CBC WITH DIFFERENTIAL/PLATELET
Abs Immature Granulocytes: 0.06 10*3/uL (ref 0.00–0.07)
Basophils Absolute: 0 10*3/uL (ref 0.0–0.1)
Basophils Relative: 1 %
Eosinophils Absolute: 0.3 10*3/uL (ref 0.0–0.5)
Eosinophils Relative: 4 %
HCT: 38.2 % (ref 36.0–46.0)
Hemoglobin: 12.2 g/dL (ref 12.0–15.0)
Immature Granulocytes: 1 %
Lymphocytes Relative: 13 %
Lymphs Abs: 1 10*3/uL (ref 0.7–4.0)
MCH: 31.3 pg (ref 26.0–34.0)
MCHC: 31.9 g/dL (ref 30.0–36.0)
MCV: 97.9 fL (ref 80.0–100.0)
Monocytes Absolute: 0.9 10*3/uL (ref 0.1–1.0)
Monocytes Relative: 11 %
Neutro Abs: 5.3 10*3/uL (ref 1.7–7.7)
Neutrophils Relative %: 70 %
Platelets: 134 10*3/uL — ABNORMAL LOW (ref 150–400)
RBC: 3.9 MIL/uL (ref 3.87–5.11)
RDW: 17.5 % — ABNORMAL HIGH (ref 11.5–15.5)
WBC: 7.5 10*3/uL (ref 4.0–10.5)
nRBC: 0 % (ref 0.0–0.2)

## 2019-04-24 LAB — IRON AND TIBC
Iron: 43 ug/dL (ref 28–170)
Saturation Ratios: 9 % — ABNORMAL LOW (ref 10.4–31.8)
TIBC: 468 ug/dL — ABNORMAL HIGH (ref 250–450)
UIBC: 425 ug/dL

## 2019-04-24 LAB — FERRITIN: Ferritin: 12 ng/mL (ref 11–307)

## 2019-04-24 NOTE — Telephone Encounter (Signed)
-----   Message from Sindy Guadeloupe, MD sent at 04/24/2019  2:22 PM EST ----- She is not anemic but iron deficient again. Does she want IV iron?

## 2019-04-24 NOTE — Telephone Encounter (Signed)
Called patient to let her know that she is iron deficient and that Dr. Janese Banks recommended for her to get IV iron. Patient agreed. I told her that we would call her once we have her appointments scheduled. Patient had no further questions.

## 2019-05-01 ENCOUNTER — Encounter: Payer: Medicare Other | Attending: Physician Assistant | Admitting: Physician Assistant

## 2019-05-01 ENCOUNTER — Other Ambulatory Visit: Payer: Self-pay

## 2019-05-01 DIAGNOSIS — M14672 Charcot's joint, left ankle and foot: Secondary | ICD-10-CM | POA: Insufficient documentation

## 2019-05-01 DIAGNOSIS — M14671 Charcot's joint, right ankle and foot: Secondary | ICD-10-CM | POA: Insufficient documentation

## 2019-05-01 DIAGNOSIS — J449 Chronic obstructive pulmonary disease, unspecified: Secondary | ICD-10-CM | POA: Diagnosis not present

## 2019-05-01 DIAGNOSIS — L97522 Non-pressure chronic ulcer of other part of left foot with fat layer exposed: Secondary | ICD-10-CM | POA: Diagnosis not present

## 2019-05-01 DIAGNOSIS — E1151 Type 2 diabetes mellitus with diabetic peripheral angiopathy without gangrene: Secondary | ICD-10-CM | POA: Insufficient documentation

## 2019-05-01 DIAGNOSIS — K7460 Unspecified cirrhosis of liver: Secondary | ICD-10-CM | POA: Insufficient documentation

## 2019-05-01 DIAGNOSIS — E11610 Type 2 diabetes mellitus with diabetic neuropathic arthropathy: Secondary | ICD-10-CM | POA: Diagnosis not present

## 2019-05-01 DIAGNOSIS — E11621 Type 2 diabetes mellitus with foot ulcer: Secondary | ICD-10-CM | POA: Diagnosis present

## 2019-05-01 NOTE — Progress Notes (Signed)
MANDEE, PLUTA ([id number]) Visit Report for 05/01/2019 Abuse/Suicide Risk Screen Details Patient Name: Audrey Garrett, Audrey Garrett. Date of Service: 05/01/2019 9:45 AM Medical Record Number: [id number] Patient Account Number: [id number] Date of Birth/Sex: [birth year] (59 y.o. F) Treating RN: Army Melia Primary Care Yannely Kintzel: Pernell Dupre Other Clinician: Referring Belinda Bringhurst: [place] Cellar Treating Jade Burkard/Extender: Melburn Hake, HOYT Weeks in Treatment: 0 Abuse/Suicide Risk Screen Items Answer ABUSE RISK SCREEN: Has anyone close to you tried to hurt or harm you recentlyo No Do you feel uncomfortable with anyone in your familyo No Has anyone forced you do things that you didnot want to doo No Electronic Signature(s) Signed: 05/01/2019 11:23:06 AM By: Army Melia Entered By: Army Melia on 05/01/2019 10:07:28 [name] ([id number]) -------------------------------------------------------------------------------- Activities of Daily Living Details Patient Name: [name] Date of Service: 05/01/2019 9:45 AM Medical Record Number: [id number] Patient Account Number: [id number] Date of Birth/Sex: Jun 21, 1960 (59 y.o. F) Treating RN: Army Melia Primary Care Darrian Goodwill: Pernell Dupre Other Clinician: Referring Candace Ramus: [place] Cellar Treating Jari Dipasquale/Extender: Melburn Hake, HOYT Weeks in Treatment: 0 Activities of Daily Living Items Answer Activities of Daily Living (Please select one for each item) Drive Automobile Not Able Take Medications Completely Able Use Telephone Completely Able Care for Appearance Need Assistance Use Toilet Need Assistance Bath / Shower Need Assistance Dress Self Need Assistance Feed Self Completely Able Walk Need Assistance Get In / Out Bed Need Assistance Housework Need Assistance Prepare Meals Need Assistance Handle Money Completely Able Shop for Self Completely Able Electronic Signature(s) Signed: 05/01/2019 11:23:06 AM By: Army Melia Entered By: Army Melia on 05/01/2019 10:07:59 [name] ([id number]) -------------------------------------------------------------------------------- Education Screening Details Patient Name: [name] Date of Service: 05/01/2019 9:45 AM Medical Record Number: [id number] Patient Account Number: [id number] Date of Birth/Sex: [birth year] (59 y.o. F) Treating RN: Army Melia Primary Care Gabrielle Wakeland: Pernell Dupre Other Clinician: Referring Savien Mamula: [place] Cellar Treating Lei Dower/Extender: Sharalyn Ink in Treatment: 0 Learning Preferences/Education Level/Primary Language Learning Preference: Explanation Highest Education Level: High School Preferred Language: English Cognitive Barrier Language Barrier: No Translator Needed: No Memory Deficit: No Emotional Barrier: No Cultural/Religious Beliefs Affecting Medical Care: No Physical Barrier Impaired Vision: No Impaired Hearing: No Decreased Hand dexterity: No Knowledge/Comprehension Knowledge Level: High Comprehension Level: High Ability to understand written High instructions: Ability to understand verbal High instructions: Motivation Anxiety Level: Calm Cooperation: Cooperative Education Importance: Acknowledges Need Interest in Health Problems: Asks Questions Perception: Coherent Willingness to Engage in Self- High Management Activities: Readiness to Engage in Self- High Management Activities: Electronic Signature(s) Signed: 05/01/2019 11:23:06 AM By: Army Melia Entered By: Army Melia on 05/01/2019 10:08:15 [name] ([id number]) -------------------------------------------------------------------------------- Fall Risk Assessment Details Patient Name: [name] Date of Service: 05/01/2019 9:45 AM Medical Record Number: [id number] Patient Account Number: [id number] Date of Birth/Sex: 1961/03/26 (59 y.o. F) Treating RN: Army Melia Primary Care Makina Skow: Pernell Dupre Other Clinician: Referring  Amil Bouwman: [place] Cellar Treating Karmyn Lowman/Extender: Melburn Hake, HOYT Weeks in Treatment: 0 Fall Risk Assessment Items Have you had 2 or more falls in the last 12 monthso 0 No Have you had any fall that resulted in injury in the last 12 monthso 0 No FALLS RISK SCREEN History of falling - immediate or within 3 months 0 No Secondary diagnosis (Do you have 2 or more medical diagnoseso) 0 No Ambulatory aid None/bed rest/wheelchair/nurse 0 No Crutches/cane/walker 15 Yes Furniture 0 No Intravenous therapy Access/Saline/Heparin Lock 0 No Gait/Transferring Normal/ bed rest/ wheelchair 0 No Weak (short steps with or without  shuffle, stooped but able to lift head while 0 No walking, may seek support from furniture) Impaired (short steps with shuffle, may have difficulty arising from chair, head 0 No down, impaired balance) Mental Status Oriented to own ability 0 No Electronic Signature(s) Signed: 05/01/2019 11:23:06 AM By: Army Melia Entered By: Army Melia on 05/01/2019 10:08:23 [name] ([id number]) -------------------------------------------------------------------------------- Foot Assessment Details Patient Name: [name] Date of Service: 05/01/2019 9:45 AM Medical Record Number: [id number] Patient Account Number: [id number] Date of Birth/Sex: 1960-03-31 (59 y.o. F) Treating RN: Army Melia Primary Care Vonette Grosso: Pernell Dupre Other Clinician: Referring Chestina Komatsu: [place] [facility]ellar Treating Kyrus Hyde/Extender: Melburn Hake, HOYT Weeks in Treatment: 0 Foot Assessment Items Site Locations + = Sensation present, - = Sensation absent, C = Callus, U = Ulcer R = Redness, W = Warmth, M = Maceration, PU = Pre-ulcerative lesion F = Fissure, S = Swelling, D = Dryness Assessment Right: Left: Other Deformity: No No Prior Foot Ulcer: No No Prior Amputation: No No Charcot Joint: No No Ambulatory Status: Gait: Notes pt in bilateral cast Electronic Signature(s) Signed: 05/01/2019  11:23:06 AM By: Army Melia Entered By: Army Melia on 05/01/2019 10:08:46 [name] ([id number]) -------------------------------------------------------------------------------- Nutrition Risk Screening Details Patient Name: [name] Date of Service: 05/01/2019 9:45 AM Medical Record Number: [id number] Patient Account Number: [id number] Date of Birth/Sex: 1960/12/18 (59 y.o. F) Treating RN: Army Melia Primary Care Kellyn Mccary: Pernell Dupre Other Clinician: Referring Waqas Bruhl: [place] Cellar Treating Adja Ruff/Extender: Melburn Hake, HOYT Weeks in Treatment: 0 Height (in): 70 Weight (lbs): 180 Body Mass Index (BMI): 25.8 Nutrition Risk Screening Items Score Screening NUTRITION RISK SCREEN: I have an illness or condition that made me change the kind and/or amount of 0 No food I eat I eat fewer than two meals per day 0 No I eat few fruits and vegetables, or milk products 0 No I have three or more drinks of beer, liquor or wine almost every day 0 No I have tooth or mouth problems that make it hard for me to eat 0 No I don't always have enough money to buy the food I need 0 No I eat alone most of the time 0 No I take three or more different prescribed or over-the-counter drugs a day 0 No Without wanting to, I have lost or gained 10 pounds in the last six months 0 No I am not always physically able to shop, cook and/or feed myself 0 No Nutrition Protocols Good Risk Protocol 0 No interventions needed Moderate Risk Protocol High Risk Proctocol Risk Level: Good Risk Score: 0 Electronic Signature(s) Signed: 05/01/2019 11:23:06 AM By: Army Melia Entered By: Army Melia on 05/01/2019 16:60:63

## 2019-05-02 ENCOUNTER — Other Ambulatory Visit: Payer: Self-pay | Admitting: Oncology

## 2019-05-02 NOTE — Progress Notes (Signed)
VERNELLA, NIZNIK ([id number]) Visit Report for 05/01/2019 Allergy List Garrett Patient Name: Audrey Garrett, Audrey Garrett. Date of Service: 05/01/2019 9:45 AM Medical Record Number: [id number] Patient Account Number: [id number] Date of Birth/Sex: 03/18/1961 (59 y.o. F) Treating RN: Army Melia Primary Care Mionna Advincula: Pernell Dupre Other Clinician: Referring Nickie Warwick: [place] Cellar Treating Tracer Gutridge/Extender: STONE III, HOYT Weeks in Treatment: 0 Allergies Active Allergies No Known Allergies Allergy Notes Electronic Signature(s) Signed: 05/01/2019 11:23:06 AM By: Army Melia Entered By: Army Melia on 05/01/2019 10:02:09 Audrey Garrett ([id number]) -------------------------------------------------------------------------------- Arrival Information Garrett Patient Name: Audrey Garrett Date of Service: 05/01/2019 9:45 AM Medical Record Number: [id number] Patient Account Number: [id number] Date of Birth/Sex: [birth year] (59 y.o. F) Treating RN: Montey Hora Primary Care Tewana Bohlen: Pernell Dupre Other Clinician: Referring Kenneisha Cochrane: [place] Cellar Treating Mikenzie Mccannon/Extender: Melburn Hake, HOYT Weeks in Treatment: 0 Visit Information Patient Arrived: Wheel Chair Arrival Time: 09:37 Accompanied By: husband Transfer Assistance: None Patient Identification Verified: Yes Secondary Verification Process Completed: Yes Patient Has Alerts: Yes Patient Alerts: DMII Electronic Signature(s) Signed: 05/01/2019 10:19:11 AM By: Montey Hora Entered By: Montey Hora on 05/01/2019 10:19:11 Audrey Garrett ([id number]) -------------------------------------------------------------------------------- Clinic Level of Care Assessment Garrett Patient Name: Audrey Garrett Date of Service: 05/01/2019 9:45 AM Medical Record Number: [id number] Patient Account Number: [id number] Date of Birth/Sex: November 20, 1960 (59 y.o. F) Treating RN: Montey Hora Primary Care Sirinity Outland: Pernell Dupre Other Clinician: Referring Tramel Westbrook:  [place] Cellar Treating Abdinasir Spadafore/Extender: Melburn Hake, HOYT Weeks in Treatment: 0 Clinic Level of Care Assessment Items TOOL 2 Quantity Score []  - Use when only an EandM is performed on the INITIAL visit 0 ASSESSMENTS - Nursing Assessment / Reassessment X - General Physical Exam (combine w/ comprehensive assessment (listed just below) when 1 20 performed on new pt. evals) X- 1 25 Comprehensive Assessment (HX, ROS, Risk Assessments, Wounds Hx, etc.) ASSESSMENTS - Wound and Skin Assessment / Reassessment []  - Simple Wound Assessment / Reassessment - one wound 0 X- 3 5 Complex Wound Assessment / Reassessment - multiple wounds []  - 0 Dermatologic / Skin Assessment (not related to wound area) ASSESSMENTS - Ostomy and/or Continence Assessment and Care []  - Incontinence Assessment and Management 0 []  - 0 Ostomy Care Assessment and Management (repouching, etc.) PROCESS - Coordination of Care []  - Simple Patient / Family Education for ongoing care 0 X- 1 20 Complex (extensive) Patient / Family Education for ongoing care X- 1 10 Staff obtains Programmer, systems, Records, Test Results / Process Orders []  - 0 Staff telephones HHA, Nursing Homes / Clarify orders / etc []  - 0 Routine Transfer to another Facility (non-emergent condition) []  - 0 Routine Hospital Admission (non-emergent condition) X- 1 15 New Admissions / Biomedical engineer / Ordering NPWT, Apligraf, etc. []  - 0 Emergency Hospital Admission (emergent condition) X- 1 10 Simple Discharge Coordination []  - 0 Complex (extensive) Discharge Coordination PROCESS - Special Needs []  - Pediatric / Minor Patient Management 0 []  - 0 Isolation Patient Management Audrey Garrett, Audrey Garrett ([id number]) []  - 0 Hearing / Language / Visual special needs []  - 0 Assessment of Community assistance (transportation, D/C planning, etc.) []  - 0 Additional assistance / Altered mentation []  - 0 Support Surface(s) Assessment (bed, cushion, seat,  etc.) INTERVENTIONS - Wound Cleansing / Measurement X - Wound Imaging (photographs - any number of wounds) 1 5 []  - 0 Wound Tracing (instead of photographs) []  - 0 Simple Wound Measurement - one wound X- 3 5 Complex Wound Measurement - multiple wounds []  - 0 Simple Wound Cleansing - one  wound X- 3 5 Complex Wound Cleansing - multiple wounds INTERVENTIONS - Wound Dressings X - Small Wound Dressing one or multiple wounds 3 10 []  - 0 Medium Wound Dressing one or multiple wounds []  - 0 Large Wound Dressing one or multiple wounds []  - 0 Application of Medications - injection INTERVENTIONS - Miscellaneous []  - External ear exam 0 []  - 0 Specimen Collection (cultures, biopsies, blood, body fluids, etc.) []  - 0 Specimen(s) / Culture(s) sent or taken to Lab for analysis []  - 0 Patient Transfer (multiple staff / Civil Service fast streamer / Similar devices) []  - 0 Simple Staple / Suture removal (25 or less) []  - 0 Complex Staple / Suture removal (26 or more) []  - 0 Hypo / Hyperglycemic Management (close monitor of Blood Glucose) []  - 0 Ankle / Brachial Index (ABI) - do not check if billed separately Has the patient been seen at the hospital within the last three years: Yes Total Score: 180 Level Of Care: New/Established - Level 5 Electronic Signature(s) Signed: 05/01/2019 4:55:53 PM By: Montey Hora Entered By: Montey Hora on 05/01/2019 10:38:46 Audrey Garrett ([id number]) -------------------------------------------------------------------------------- Encounter Discharge Information Garrett Patient Name: Audrey Garrett Date of Service: 05/01/2019 9:45 AM Medical Record Number: [id number] Patient Account Number: [id number] Date of Birth/Sex: [birth year] (59 y.o. F) Treating RN: Montey Hora Primary Care Moneisha Vosler: Pernell Dupre Other Clinician: Referring Emojean Gertz: [place] Cellar Treating Tommie Dejoseph/Extender: Melburn Hake, HOYT Weeks in Treatment: 0 Encounter Discharge Information Items Post  Procedure Vitals Discharge Condition: Stable Temperature (F): 99.0 Ambulatory Status: Ambulatory Pulse (bpm): 93 Discharge Destination: Home Respiratory Rate (breaths/min): 16 Transportation: Private Auto Blood Pressure (mmHg): 100/61 Accompanied By: spouse Schedule Follow-up Appointment: Yes Clinical Summary of Care: Electronic Signature(s) Signed: 05/01/2019 4:55:53 PM By: Montey Hora Entered By: Montey Hora on 05/01/2019 10:46:22 Audrey Garrett ([id number]) -------------------------------------------------------------------------------- Lower Extremity Assessment Garrett Patient Name: Audrey Garrett Date of Service: 05/01/2019 9:45 AM Medical Record Number: [id number] Patient Account Number: [id number] Date of Birth/Sex: [birth year] (59 y.o. 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 F) Treating RN: Montey Hora Primary Care Rayce Brahmbhatt: Pernell Dupre Other Clinician: Referring Donovyn Guidice: [place] Cellar Treating Linda Grimmer/Extender: Melburn Hake, HOYT Weeks in Treatment: 0 Vital Signs Height(in): 70 Pulse(bpm): 57 Weight(lbs): 180 Blood Pressure(mmHg): 100/61 Body Mass Index(BMI): 26 Temperature(F): 99.0 Respiratory Rate 16 (breaths/min): Photos: Wound Location: Left Foot - Medial Left Foot - Dorsal Left Toe Great Wounding Event: Pressure  Injury Gradually Appeared Gradually Appeared Primary Etiology: Pressure Ulcer Pressure Ulcer Trauma, Other Comorbid History: Chronic Obstructive Chronic Obstructive Chronic Obstructive Pulmonary Disease (COPD), Pulmonary Disease (COPD), Pulmonary Disease (COPD), Cirrhosis , Type II Diabetes, Cirrhosis , Type II Diabetes, Cirrhosis , Type II Diabetes, Received Radiation Received Radiation Received Radiation Date Acquired: 02/27/2019 04/24/2019 04/26/2019 Weeks of Treatment: 0 0 0 Wound Status: Open Open Open Measurements L x W x D 0.6x0.6x0.1 1.5x4x0.1 0.3x0.3x0.1 (cm) Area (cm) : 0.283 4.712 0.071 Volume (cm) : 0.028 0.471 0.007 % Reduction in Area: 0.00% 0.00% 0.00% % Reduction in Volume: 0.00% 0.00% 0.00% Classification: Category/Stage II Category/Stage III Full  Thickness Without Exposed Support Structures Exudate Amount: Medium Medium Medium Exudate Type: Serous Serous Serous Exudate Color: amber amber amber Wound Margin: Flat and Intact Flat and Intact Flat and Intact Granulation Amount: Large (67-100%) Small (1-33%) Large (67-100%) Granulation Quality: Pink Pink Pink Necrotic Amount: None Present (0%) Large (67-100%) None Present (0%) Exposed Structures: Fascia: No Fat Layer (Subcutaneous Fat Layer (Subcutaneous Fat Layer (Subcutaneous Tissue) Exposed: Yes Tissue) Exposed: Yes Tissue) Exposed: No Fascia: No Fascia: No Tendon: No Tendon: No Tendon: No Audrey Garrett, Audrey B. ([id number]) Muscle: No Muscle: No Muscle: No Joint: No Joint: No Joint: No Bone: No Bone: No Bone: No Limited to Skin Breakdown Epithelialization: None None None Treatment Notes Electronic Signature(s) Signed: 05/01/2019 4:55:53 PM By: Montey Hora Entered By: Montey Hora on 05/01/2019 10:35:22 Audrey Garrett ([id number]) -------------------------------------------------------------------------------- Audrey Garrett Patient Name: Audrey Garrett Date of Service: 05/01/2019  9:45 AM Medical Record Number: [id number] Patient Account Number: [id number] Date of Birth/Sex: 1960-07-08 (59 y.o. F) Treating RN: Montey Hora Primary Care Conal Shetley: Pernell Dupre Other Clinician: Referring Selmer Adduci: [place] Cellar Treating Neaveh Belanger/Extender: Melburn Hake, HOYT Weeks in Treatment: 0 Active Inactive Abuse / Safety / Falls / Self Care Management Nursing Diagnoses: Potential for falls Goals: Patient will remain injury free related to falls Date Initiated: 05/01/2019 Target Resolution Date: 08/04/2019 Goal Status: Active Interventions: Assess fall risk on admission and as needed Notes: Necrotic Tissue Nursing Diagnoses: Impaired tissue integrity related to necrotic/devitalized tissue Goals: Necrotic/devitalized tissue will be minimized in the wound bed Date Initiated: 05/01/2019 Target Resolution Date: 08/04/2019 Goal Status: Active Interventions: Provide education on necrotic tissue and debridement process Notes: Orientation to the Wound Care Program Nursing Diagnoses: Knowledge deficit related to the wound healing center program Goals: Patient/caregiver will verbalize understanding of the [facility] Program Date Initiated: 05/01/2019 Target Resolution Date: 08/04/2019 Goal Status: Active Interventions: Provide education on orientation to the wound center [place]. ([id number]) Notes: Pressure Nursing Diagnoses: Knowledge deficit related to causes and risk factors for pressure ulcer development Goals: Patient will remain free from development of additional pressure ulcers Date Initiated: 05/01/2019 Target Resolution Date: 08/04/2019 Goal Status: Active Interventions: Provide education on pressure ulcers Notes: Wound/Skin Impairment Nursing Diagnoses: Impaired tissue integrity Goals: Ulcer/skin breakdown will heal within 14 weeks Date Initiated: 05/01/2019 Target Resolution Date: 08/04/2019 Goal Status: Active Interventions: Assess  patient/caregiver ability to obtain necessary supplies Assess patient/caregiver ability to perform ulcer/skin care regimen upon admission and as needed Assess ulceration(s) every visit Notes: Electronic Signature(s) Signed: 05/01/2019 4:55:53 PM By: Montey Hora Entered By: Montey Hora on 05/01/2019 10:35:06 Audrey Garrett ([id number]) -------------------------------------------------------------------------------- Pain Assessment Garrett Patient Name: Audrey Garrett Date of Service: 05/01/2019 9:45 AM Medical Record Number: [id number] Patient Account Number: [id number] Date of Birth/Sex: [birth year] (59 y.o. F) Treating RN: Montey Hora Primary Care Victoire Deans: Pernell Dupre Other Clinician: Referring Enas Winchel:  Cellar Treating Kieanna Rollo/Extender: Melburn Hake, HOYT Weeks in Treatment: 0 Active Problems Location of Pain Severity and Description of Pain Patient Has Paino No Site Locations Pain Management and Medication Current Pain Management: Electronic Signature(s) Signed: 05/01/2019 1:33:31 PM By: Paulla Fore, RRT, CHT Signed: 05/01/2019 4:55:53 PM By: Montey Hora Entered By: Lorine Bears on 05/01/2019 09:38:52 Audrey Garrett ([id number]) -------------------------------------------------------------------------------- Patient/Caregiver Education Garrett Patient Name: Audrey Garrett Date of Service: 05/01/2019 9:45 AM Medical Record Number: [id number] Patient Account Number: [id number] Date of Birth/Gender: November 19, 1960 (59 y.o. F) Treating RN: Montey Hora Primary Care Physician: Pernell Dupre Other Clinician: Referring Physician: ADAMS,  SAMUEL Treating Physician/Extender: Worthy Keeler Weeks in Treatment: 0 Education Assessment Education Provided To: Patient and Caregiver Education Topics Provided Offloading: Handouts: Other: purpose of TCC Methods: Explain/Verbal Responses: State content correctly Electronic  Signature(s) Signed: 05/01/2019 4:55:53 PM By: Montey Hora Entered By: Montey Hora on 05/01/2019 10:41:57 Audrey Garrett ([id number]) -------------------------------------------------------------------------------- Wound Assessment Garrett Patient Name: Audrey Garrett Date of Service: 05/01/2019 9:45 AM Medical Record Number: [id number] Patient Account Number: [id number] Date of Birth/Sex: 1960/09/07 (59 y.o. F) Treating RN: Army Melia Primary Care Zayda Angell: Pernell Dupre Other Clinician: Referring Emiliano Welshans: [place] Cellar Treating Aleksia Freiman/Extender: Melburn Hake, HOYT Weeks in Treatment: 0 Wound Status Wound Number: 1 Primary Diabetic Wound/Ulcer of the Lower Extremity Etiology: Wound Location: Left Foot - Medial Wound Open Wounding Event: Pressure Injury Status: Date Acquired: 02/27/2019 Comorbid Chronic Obstructive Pulmonary Disease Weeks Of Treatment: 0 History: (COPD), Cirrhosis , Type II Diabetes, Received Clustered Wound: No Radiation Photos Wound Measurements Length: (cm) 0.6 % Reduction i Width: (cm) 0.6 % Reduction i Depth: (cm) 0.1 Epithelializa Area: (cm) 0.283 Tunneling: Volume: (cm) 0.028 Undermining: n Area: 0% n Volume: 0% tion: None No No Wound Description Classification: Grade 1 Foul Odor Af Wound Margin: Flat and Intact Slough/Fibri Exudate Amount: Medium Exudate Type: Serous Exudate Color: amber ter Cleansing: No no No Wound Bed Granulation Amount: Large (67-100%) Exposed Structure Granulation Quality: Pink Fascia Exposed: No Necrotic Amount: None Present (0%) Fat Layer (Subcutaneous Tissue) Exposed: No Tendon Exposed: No Muscle Exposed: No Joint Exposed: No Bone Exposed: No Limited to Skin Audrey Garrett, Audrey Garrett ([id number]) Treatment Notes Wound #1 (Left, Medial Foot) Notes in clinic today - abd/gauze secured with tape and ace wrap Electronic Signature(s) Signed: 05/01/2019 11:35:20 AM By: Montey Hora Signed: 05/02/2019  9:28:55 AM By: Army Melia Previous Signature: 05/01/2019 10:20:54 AM Version By: Army Melia Entered By: Montey Hora on 05/01/2019 11:35:19 Audrey Garrett ([id number]) -------------------------------------------------------------------------------- Wound Assessment Garrett Patient Name: Audrey Hawking B. Date of Service: 05/01/2019 9:45 AM Medical Record Number: [id number] Patient Account Number: [id number] Date of Birth/Sex: 1960-06-16 (59 y.o. F) Treating RN: Montey Hora Primary Care Tylasia Fletchall: Pernell Dupre Other Clinician: Referring Shalin Linders: [place] Cellar Treating Morgin Halls/Extender: Melburn Hake, HOYT Weeks in Treatment: 0 Wound Status Wound Number: 2 Primary Diabetic Wound/Ulcer of the Lower Extremity Etiology: Wound Location: Left, Dorsal Foot Wound Open Wounding Event: Gradually Appeared Status: Date Acquired: 04/24/2019 Comorbid Chronic Obstructive Pulmonary Disease Weeks Of Treatment: 0 History: (COPD), Cirrhosis , Type II Diabetes, Received Clustered Wound: No Radiation Photos Wound Measurements Length: (cm) 1.5 % Reduction in Width: (cm) 4 % Reduction in Depth: (cm) 0.1 Epithelializati Area: (cm) 4.712 Tunneling: Volume: (cm) 0.471 Undermining: Area: 0% Volume: 0% on: None No No Wound Description Classification: Grade 1 Foul Odor Afte Wound Margin: Flat and Intact Slough/Fibrino Exudate Amount: Medium Exudate Type: Serous Exudate Color: amber r Cleansing: No Yes Wound Bed Granulation Amount: Small (1-33%) Exposed Structure Granulation Quality: Pink Fascia Exposed: No Necrotic Amount: Large (67-100%) Fat Layer (Subcutaneous Tissue) Exposed: Yes Necrotic Quality: Adherent Slough Tendon Exposed: No Muscle Exposed: No Joint Exposed: No Bone Exposed: No Treatment Notes Audrey Garrett, Audrey Garrett ([id number]) Wound #2 (Left, Dorsal Foot) Notes in clinic today - abd/gauze secured with tape and ace wrap Electronic Signature(s) Signed: 05/02/2019 4:41:21 PM  By: Montey Hora Previous Signature: 05/01/2019 11:35:41 AM Version By: Montey Hora Previous Signature: 05/01/2019 10:22:27 AM Version By: Army Melia Entered By: Montey Hora on 05/02/2019 08:18:43 Audrey Garrett ([id number]) -------------------------------------------------------------------------------- Wound Assessment Garrett Patient Name: Audrey Hawking B.  Date of Service: 05/01/2019 9:45 AM Medical Record Number: [id number] Patient Account Number: [id number] Date of Birth/Sex: 1960/10/08 (59 y.o. F) Treating RN: Montey Hora Primary Care Chesney Suares: Pernell Dupre Other Clinician: Referring Bracy Pepper: [place] Cellar Treating Doc Mandala/Extender: Melburn Hake, HOYT Weeks in Treatment: 0 Wound Status Wound Number: 3 Primary Diabetic Wound/Ulcer of the Lower Extremity Etiology: Wound Location: Left Toe Great Wound Open Wounding Event: Gradually Appeared Status: Date Acquired: 04/26/2019 Comorbid Chronic Obstructive Pulmonary Disease Weeks Of Treatment: 0 History: (COPD), Cirrhosis , Type II Diabetes, Received Clustered Wound: No Radiation Photos Wound Measurements Length: (cm) 0.3 % Reduction in Width: (cm) 0.3 % Reduction in Depth: (cm) 0.1 Epithelializati Area: (cm) 0.071 Tunneling: Volume: (cm) 0.007 Undermining: Area: 0% Volume: 0% on: None No No Wound Description Classification: Grade 1 Foul Odor Afte Wound Margin: Flat and Intact Slough/Fibrino Exudate Amount: Medium Exudate Type: Serous Exudate Color: amber r Cleansing: No No Wound Bed Granulation Amount: Large (67-100%) Exposed Structure Granulation Quality: Pink Fascia Exposed: No Necrotic Amount: None Present (0%) Fat Layer (Subcutaneous Tissue) Exposed: Yes Tendon Exposed: No Muscle Exposed: No Joint Exposed: No Bone Exposed: No Treatment Notes Audrey Garrett, Audrey Garrett ([id number]) Wound #3 (Left Toe Great) Notes in clinic today - abd/gauze secured with tape and ace wrap Electronic  Signature(s) Signed: 05/01/2019 11:36:19 AM By: Montey Hora Previous Signature: 05/01/2019 10:22:48 AM Version By: Army Melia Entered By: Montey Hora on 05/01/2019 11:36:18 Audrey Garrett ([id number]) -------------------------------------------------------------------------------- Vitals Garrett Patient Name: Audrey Garrett Date of Service: 05/01/2019 9:45 AM Medical Record Number: [id number] Patient Account Number: [id number] Date of Birth/Sex: 29-Sep-1960 (59 y.o. F) Treating RN: Montey Hora Primary Care Deandria Klute: Pernell Dupre Other Clinician: Referring Anjana Cheek:  Cellar Treating Asti Mackley/Extender: Melburn Hake, HOYT Weeks in Treatment: 0 Vital Signs Time Taken: 09:38 Temperature (F): 99.0 Height (in): 70 Pulse (bpm): 93 Source: Stated Respiratory Rate (breaths/min): 16 Weight (lbs): 180 Blood Pressure (mmHg): 100/61 Source: Stated Reference Range: 80 - 120 mg / dl Body Mass Index (BMI): 25.8 Electronic Signature(s) Signed: 05/01/2019 1:33:31 PM By: Lorine Bears RCP, RRT, CHT Entered By: Lorine Bears on 05/01/2019 09:42:01

## 2019-05-02 NOTE — Progress Notes (Signed)
DANEEN, VOLCY ([id number]) Visit Report for 05/01/2019 Chief Complaint Document Details Patient Name: Audrey Garrett, Audrey Garrett. Date of Service: 05/01/2019 9:45 AM Medical Record Number: [id number] Patient Account Number: [id number] Date of Birth/Sex: [birth year] (59 y.o. F) Treating RN: Montey Hora Primary Care Provider: Pernell Dupre Other Clinician: Referring Provider: [place] Cellar Treating Provider/Extender: Melburn Hake, Joziyah Roblero Weeks in Treatment: 0 Information Obtained from: Patient Chief Complaint Left foot and toe ulcers Electronic Signature(s) Signed: 05/02/2019 1:07:50 AM By: Worthy Keeler PA-C Entered By: Worthy Keeler on 05/02/2019 00:44:40 Audrey Garrett ([id number]) -------------------------------------------------------------------------------- Debridement Details Patient Name: Audrey Garrett Date of Service: 05/01/2019 9:45 AM Medical Record Number: [id number] Patient Account Number: [id number] Date of Birth/Sex: [birth year] (59 y.o. F) Treating RN: Montey Hora Primary Care Provider: Pernell Dupre Other Clinician: Referring Provider: [place] Cellar Treating Provider/Extender: Melburn Hake, Niurka Benecke Weeks in Treatment: 0 Debridement Performed for Wound #2 Left,Dorsal Foot Assessment: Performed By: Clinician Montey Hora, RN Debridement Type: Chemical/Enzymatic/Mechanical Agent Used: Gauze and saline Level of Consciousness (Pre- Awake and Alert procedure): Pre-procedure Verification/Time Yes - 10:37 Out Taken: Start Time: 10:37 Pain Control: Lidocaine 4% Topical Solution Instrument: Other : gauze and saline Bleeding: None End Time: 10:38 Procedural Pain: 0 Post Procedural Pain: 0 Response to Treatment: Procedure was tolerated well Level of Consciousness Awake and Alert (Post-procedure): Post Debridement Measurements of Total Wound Length: (cm) 1.5 Stage: Category/Stage III Width: (cm) 4 Depth: (cm) 0.1 Volume: (cm) 0.471 Character of Wound/Ulcer  Post Improved Debridement: Post Procedure Diagnosis Same as Pre-procedure Electronic Signature(s) Signed: 05/01/2019 4:55:53 PM By: Montey Hora Signed: 05/02/2019 1:07:50 AM By: Worthy Keeler PA-C Entered By: Montey Hora on 05/01/2019 10:37:54 Audrey Garrett ([id number]) -------------------------------------------------------------------------------- HPI Details Patient Name: Audrey Garrett Date of Service: 05/01/2019 9:45 AM Medical Record Number: [id number] Patient Account Number: [id number] Date of Birth/Sex: May 29, 1960 (59 y.o. F) Treating RN: Montey Hora Primary Care Provider: Pernell Dupre Other Clinician: Referring Provider: [place] Cellar Treating Provider/Extender: Melburn Hake, Sakoya Win Weeks in Treatment: 0 History of Present Illness HPI Description: 05/01/19 upon evaluation today patient presents for initial inspection here in our clinic as a result of having issues with Charcot foot bilaterally. She is actually a patient at the [facility] orthopedic clinic in [facility]. She has been seeing Dr. [place] Cellar and she did undergo surgery for the Charcot foot on the right recently. This is still being managed by Dr. Andree Elk at this point. Fortunately she seems to be healing well however. Unfortunately she also has the wound on the medial aspect of her left foot right in the arch word has collapsed. This is been an issue for her and unfortunately just with the way she was walking there apparently was keeping things propagated as far as my conversation with the provider at [facility] would be to cause concerned they mainly put the past on in order to help with patient compliance with appropriate offloading. Nonetheless I do believe unfortunately the cast has become somewhat tight on the distal portion of the patient's foot this is actually cutting into her foot causing a secondary wound different from the original wound. Her toes are also somewhat discolored reddish in  color not cyanotic but nonetheless I feel like the swelling and edema in the distal portion of her foot is an issue at this point. With that being said I do think the patient could be a candidate for total contact cast although I do believe we need to know one get the current cast-off and  number two ensure that her toes are okay and there's no signs of anything worsening before we look at putting in the total contact cast. Nonetheless I think this would be appropriate for offloading and in fact probably the best way to go because she can actually walk with the walking boot on in the total contact cast which will allow her to continue to function daily without causing additional injury and harm to her foot. The patient does have a history of diabetes, bilateral Charcot foot for which she's had surgery on the right and is pending surgery on the left wants the right hills enough. Her most recent hemoglobin A-1 C was 6.7 on 01/24/19. In the course of today's treatment I did actually speak with Tam who is the physician assistant that works with Dr. [place] Cellar. That will be detailed in the plan. Electronic Signature(s) Signed: 05/02/2019 1:07:50 AM By: Worthy Keeler PA-C Entered By: Worthy Keeler on 05/02/2019 00:52:24 Audrey Garrett ([id number]) -------------------------------------------------------------------------------- Physical Exam Details Patient Name: Audrey Garrett Date of Service: 05/01/2019 9:45 AM Medical Record Number: [id number] Patient Account Number: [id number] Date of Birth/Sex: February 24, 1961 (59 y.o. F) Treating RN: Montey Hora Primary Care Provider: Pernell Dupre Other Clinician: Referring Provider: [place] Cellar Treating Provider/Extender: Melburn Hake, Diontay Rosencrans Weeks in Treatment: 0 Constitutional sitting or standing blood pressure is within target range for patient.. pulse regular and within target range for patient.Marland Kitchen respirations regular, non-labored and within target range  for patient.Marland Kitchen temperature within target range for patient.. Well- nourished and well-hydrated in no acute distress. Eyes conjunctiva clear no eyelid edema noted. pupils equal round and reactive to light and accommodation. Ears, Nose, Mouth, and Throat no gross abnormality of ear auricles or external auditory canals. normal hearing noted during conversation. mucus membranes moist. Respiratory normal breathing without difficulty. Cardiovascular 1+ pitting edema of the bilateral lower extremities. Gastrointestinal (GI) soft, non-tender, non-distended, +BS. no ventral hernia noted. Musculoskeletal normal gait and posture. no significant deformity or arthritic changes, no loss or range of motion, no clubbing. Psychiatric this patient is able to make decisions and demonstrates good insight into disease process. Alert and Oriented x 3. pleasant and cooperative. Notes Upon inspection today the patient actually had a window cut out of the medial aspect of her cast right around her foot so that we could access the wound itself. We did have to tear away the cotton layer protection and then subsequently get to the dressing which was placed underneath. To be honest once I got down to this it actually appeared that the wound was doing very well that she has been dealing with mainly here. With that being said though this seems to be doing well unfortunately at the distal portion overcast it appears this may be somewhat tight for her and I think is causing some discoloration of her toes though not cyanotic they are definitely showing signs of swelling and the coloration is not normal as far as I'm concerned. She did show me a picture prior to the cast application and it again it does not appear that she had any issues with this. There's no significant diminishing capillary refill although she does have edema of her distal portion of her foot where it's not covered with a cast. I really think that she may  do better with a total contact cast if she had to have something of this sort. With that being said I do think that we need to first get the current cast off which  again were not really able to remove those the nursing staff in the clinic is not trained specifically to remove a standard cast. Their training is exclusive to total contact cast into those similar they do not actually have approval to remove other cast. With that being said I did contact [facility] and spoke with Tam who is the physician assistant that has been helping take care of this patient along with Dr. [place] Cellar. Subsequently she did advise that the patient could come in in the day before 330 to get the cast off and they will put her in a boot short-term. Subsequently we will then proceed with total contact cast the next week for the patient. I think the total contact cast would be the best way to go. Electronic Signature(s) Signed: 05/02/2019 1:07:50 AM By: Worthy Keeler PA-C Entered By: Worthy Keeler on 05/02/2019 01:03:41 Audrey Garrett ([id number]) -------------------------------------------------------------------------------- Physician Orders Details Patient Name: Audrey Garrett Date of Service: 05/01/2019 9:45 AM Medical Record Number: [id number] Patient Account Number: [id number] Date of Birth/Sex: 07-06-1960 (59 y.o. F) Treating RN: Montey Hora Primary Care Provider: Pernell Dupre Other Clinician: Referring Provider: [place] Cellar Treating Provider/Extender: Melburn Hake, Tambra Muller Weeks in Treatment: 0 Verbal / Phone Orders: No Diagnosis Coding Wound Cleansing Wound #1 Left,Medial Foot o Clean wound with Normal Saline. o Dial antibacterial soap, wash wounds, rinse and pat dry prior to dressing wounds o May Shower, gently pat wound dry prior to applying new dressing. Wound #2 Left,Dorsal Foot o Clean wound with Normal Saline. o Dial antibacterial soap, wash wounds, rinse and pat dry prior  to dressing wounds o May Shower, gently pat wound dry prior to applying new dressing. Wound #3 Left Toe Great o Clean wound with Normal Saline. o Dial antibacterial soap, wash wounds, rinse and pat dry prior to dressing wounds o May Shower, gently pat wound dry prior to applying new dressing. Primary Wound Dressing Wound #1 Left,Medial Foot o Silver Alginate Wound #2 Left,Dorsal Foot o Silver Alginate Wound #3 Left Toe Great o Silver Alginate Secondary Dressing Wound #1 Left,Medial Foot o Boardered Foam Dressing - coverlet or bandaid on toe Wound #2 Left,Dorsal Foot o Boardered Foam Dressing - coverlet or bandaid on toe Wound #3 Left Toe Great o Boardered Foam Dressing - coverlet or bandaid on toe Dressing Change Frequency o Change dressing every other day. Follow-up Appointments o Return Appointment in 1 week. Off-Loading o Removable cast walker boot - per orthopedic JAIDON, SPONSEL ([id number]) Electronic Signature(s) Signed: 05/01/2019 4:55:53 PM By: Montey Hora Signed: 05/02/2019 1:07:50 AM By: Worthy Keeler PA-C Entered By: Montey Hora on 05/01/2019 10:41:32 Audrey Garrett ([id number]) -------------------------------------------------------------------------------- Problem List Details Patient Name: Audrey Garrett Date of Service: 05/01/2019 9:45 AM Medical Record Number: [id number] Patient Account Number: [id number] Date of Birth/Sex: 10-Jul-1960 (59 y.o. F) Treating RN: Montey Hora Primary Care Provider: Pernell Dupre Other Clinician: Referring Provider: [place] Cellar Treating Provider/Extender: Melburn Hake, Harris Kistler Weeks in Treatment: 0 Active Problems ICD-10 Evaluated Encounter Code Description Active Date Today Diagnosis E11.621 Type 2 diabetes mellitus with foot ulcer 05/02/2019 No Yes M14.672 Charcot's joint, left ankle and foot 05/02/2019 No Yes L97.522 Non-pressure chronic ulcer of other part of left foot with fat 05/02/2019 No  Yes layer exposed M14.671 Charcot's joint, right ankle and foot 05/02/2019 No Yes Inactive Problems Resolved Problems Electronic Signature(s) Signed: 05/02/2019 1:07:50 AM By: Worthy Keeler PA-C Entered By: Worthy Keeler on 05/02/2019 01:06:43 Audrey Garrett ([id number]) --------------------------------------------------------------------------------  Progress Note Details Patient Name: Audrey Garrett, Audrey Garrett. Date of Service: 05/01/2019 9:45 AM Medical Record Number: [id number] Patient Account Number: [id number] Date of Birth/Sex: 1961-03-10 (59 y.o. F) Treating RN: Montey Hora Primary Care Provider: Pernell Dupre Other Clinician: Referring Provider: [place] Cellar Treating Provider/Extender: Melburn Hake, Deaire Mcwhirter Weeks in Treatment: 0 Subjective Chief Complaint Information obtained from Patient Left foot and toe ulcers History of Present Illness (HPI) 05/01/19 upon evaluation today patient presents for initial inspection here in our clinic as a result of having issues with Charcot foot bilaterally. She is actually a patient at the [facility] orthopedic clinic in [facility]. She has been seeing Dr. [place] Cellar and she did undergo surgery for the Charcot foot on the right recently. This is still being managed by Dr. Andree Elk at this point. Fortunately she seems to be healing well however. Unfortunately she also has the wound on the medial aspect of her left foot right in the arch word has collapsed. This is been an issue for her and unfortunately just with the way she was walking there apparently was keeping things propagated as far as my conversation with the provider at [facility] would be to cause concerned they mainly put the past on in order to help with patient compliance with appropriate offloading. Nonetheless I do believe unfortunately the cast has become somewhat tight on the distal portion of the patient's foot this is actually cutting into her foot causing a secondary wound  different from the original wound. Her toes are also somewhat discolored reddish in color not cyanotic but nonetheless I feel like the swelling and edema in the distal portion of her foot is an issue at this point. With that being said I do think the patient could be a candidate for total contact cast although I do believe we need to know one get the current cast-off and number two ensure that her toes are okay and there's no signs of anything worsening before we look at putting in the total contact cast. Nonetheless I think this would be appropriate for offloading and in fact probably the best way to go because she can actually walk with the walking boot on in the total contact cast which will allow her to continue to function daily without causing additional injury and harm to her foot. The patient does have a history of diabetes, bilateral Charcot foot for which she's had surgery on the right and is pending surgery on the left wants the right hills enough. Her most recent hemoglobin A-1 C was 6.7 on 01/24/19. In the course of today's treatment I did actually speak with Tam who is the physician assistant that works with Dr. [place] Cellar. That will be detailed in the plan. Patient History Information obtained from Patient. Allergies No Known Allergies Family History Cancer - Siblings, Diabetes - Father, Hypertension - Mother, Stroke - Mother, No family history of Heart Disease, Hereditary Spherocytosis, Kidney Disease, Lung Disease, Seizures, Thyroid Problems, Tuberculosis. Social History Current every day smoker - 45 years, Marital Status - Married, Alcohol Use - Daily, Drug Use - Prior History, Caffeine Use - Never. Medical History Eyes Denies history of Cataracts, Glaucoma, Optic Neuritis Ear/Nose/Mouth/Throat Denies history of Chronic sinus problems/congestion, Middle ear problems Audrey Garrett, Audrey Garrett ([id number]) Hematologic/Lymphatic Denies history of Anemia, Hemophilia, Human  Immunodeficiency Virus, Lymphedema, Sickle Cell Disease Respiratory Patient has history of Chronic Obstructive Pulmonary Disease (COPD) Denies history of Aspiration, Asthma, Pneumothorax, Sleep Apnea, Tuberculosis Cardiovascular Denies history of Angina, Arrhythmia, Congestive Heart Failure,  Coronary Artery Disease, Deep Vein Thrombosis, Hypertension, Hypotension, Myocardial Infarction, Peripheral Arterial Disease, Peripheral Venous Disease, Phlebitis, Vasculitis Gastrointestinal Patient has history of Cirrhosis Denies history of Colitis, Crohn s, Hepatitis A, Hepatitis B, Hepatitis C Endocrine Patient has history of Type II Diabetes Genitourinary Denies history of End Stage Renal Disease Immunological Denies history of Lupus Erythematosus, Raynaud s, Scleroderma Integumentary (Skin) Denies history of History of Burn, History of pressure wounds Musculoskeletal Denies history of Gout, Rheumatoid Arthritis, Osteoarthritis, Osteomyelitis Neurologic Denies history of Dementia, Neuropathy, Quadriplegia, Paraplegia, Seizure Disorder Oncologic Patient has history of Received Radiation Denies history of Received Chemotherapy Psychiatric Denies history of Anorexia/bulimia, Confinement Anxiety Patient is treated with Insulin, Oral Agents. Blood sugar is tested. Hospitalization/Surgery History - foot surgery at [facility]. Review of Systems (ROS) Constitutional Symptoms (General Health) Denies complaints or symptoms of Fatigue, Fever, Chills, Marked Weight Change. Eyes Denies complaints or symptoms of Dry Eyes, Vision Changes, Glasses / Contacts. Ear/Nose/Mouth/Throat Denies complaints or symptoms of Difficult clearing ears, Sinusitis. Hematologic/Lymphatic Denies complaints or symptoms of Bleeding / Clotting Disorders, Human Immunodeficiency Virus. Respiratory Denies complaints or symptoms of Chronic or frequent coughs, Shortness of Breath. Cardiovascular Denies complaints or symptoms  of Chest pain, LE edema. Gastrointestinal Denies complaints or symptoms of Frequent diarrhea, Nausea, Vomiting. Endocrine Denies complaints or symptoms of Hepatitis, Thyroid disease, Polydypsia (Excessive Thirst). Genitourinary Denies complaints or symptoms of Kidney failure/ Dialysis, Incontinence/dribbling, hx kidney cancer Immunological Denies complaints or symptoms of Hives, Itching. Integumentary (Skin) Complains or has symptoms of Wounds. Denies complaints or symptoms of Bleeding or bruising tendency, Breakdown, Swelling. Musculoskeletal Denies complaints or symptoms of Muscle Pain, Muscle Weakness. Audrey Garrett, Audrey Garrett ([id number]) Neurologic Denies complaints or symptoms of Numbness/parasthesias, Focal/Weakness. Oncologic bilateral lung cancer Psychiatric Denies complaints or symptoms of Anxiety, Claustrophobia. Objective Constitutional sitting or standing blood pressure is within target range for patient.. pulse regular and within target range for patient.Marland Kitchen respirations regular, non-labored and within target range for patient.Marland Kitchen temperature within target range for patient.. Well- nourished and well-hydrated in no acute distress. Vitals Time Taken: 9:38 AM, Height: 70 in, Source: Stated, Weight: 180 lbs, Source: Stated, BMI: 25.8, Temperature: 99.0 F, Pulse: 93 bpm, Respiratory Rate: 16 breaths/min, Blood Pressure: 100/61 mmHg. Eyes conjunctiva clear no eyelid edema noted. pupils equal round and reactive to light and accommodation. Ears, Nose, Mouth, and Throat no gross abnormality of ear auricles or external auditory canals. normal hearing noted during conversation. mucus membranes moist. Respiratory normal breathing without difficulty. Cardiovascular 1+ pitting edema of the bilateral lower extremities. Gastrointestinal (GI) soft, non-tender, non-distended, +BS. no ventral hernia noted. Musculoskeletal normal gait and posture. no significant deformity or arthritic changes,  no loss or range of motion, no clubbing. Psychiatric this patient is able to make decisions and demonstrates good insight into disease process. Alert and Oriented x 3. pleasant and cooperative. General Notes: Upon inspection today the patient actually had a window cut out of the medial aspect of her cast right around her foot so that we could access the wound itself. We did have to tear away the cotton layer protection and then subsequently get to the dressing which was placed underneath. To be honest once I got down to this it actually appeared that the wound was doing very well that she has been dealing with mainly here. With that being said though this seems to be doing well unfortunately at the distal portion overcast it appears this may be somewhat tight for her and I think is causing some discoloration of  her toes though not cyanotic they are definitely showing signs of swelling and the coloration is not normal as far as I'm concerned. She did show me a picture prior to the cast application and it again it does not appear that she had any issues with this. There's no significant diminishing capillary refill although she does have edema of her distal portion of her foot where it's not covered with a cast. I really think that she may do better with a total contact cast if she had to have something of this sort. With that being said I do think that we need to first get the current cast off which again were not really Audrey Garrett, Audrey B. ([id number]) able to remove those the nursing staff in the clinic is not trained specifically to remove a standard cast. Their training is exclusive to total contact cast into those similar they do not actually have approval to remove other cast. With that being said I did contact [facility] and spoke with Tam who is the physician assistant that has been helping take care of this patient along with Dr. [place] Cellar. Subsequently she did advise that the patient  could come in in the day before 330 to get the cast off and they will put her in a boot short-term. Subsequently we will then proceed with total contact cast the next week for the patient. I think the total contact cast would be the best way to go. Integumentary (Hair, Skin) Wound #1 status is Open. Original cause of wound was Pressure Injury. The wound is located on the Left,Medial Foot. The wound measures 0.6cm length x 0.6cm width x 0.1cm depth; 0.283cm^2 area and 0.028cm^3 volume. The wound is limited to skin breakdown. There is no tunneling or undermining noted. There is a medium amount of serous drainage noted. The wound margin is flat and intact. There is large (67-100%) pink granulation within the wound bed. There is no necrotic tissue within the wound bed. Wound #2 status is Open. Original cause of wound was Gradually Appeared. The wound is located on the Left,Dorsal Foot. The wound measures 1.5cm length x 4cm width x 0.1cm depth; 4.712cm^2 area and 0.471cm^3 volume. There is Fat Layer (Subcutaneous Tissue) Exposed exposed. There is no tunneling or undermining noted. There is a medium amount of serous drainage noted. The wound margin is flat and intact. There is small (1-33%) pink granulation within the wound bed. There is a large (67-100%) amount of necrotic tissue within the wound bed including Adherent Slough. Wound #3 status is Open. Original cause of wound was Gradually Appeared. The wound is located on the Left Toe Great. The wound measures 0.3cm length x 0.3cm width x 0.1cm depth; 0.071cm^2 area and 0.007cm^3 volume. There is Fat Layer (Subcutaneous Tissue) Exposed exposed. There is no tunneling or undermining noted. There is a medium amount of serous drainage noted. The wound margin is flat and intact. There is large (67-100%) pink granulation within the wound bed. There is no necrotic tissue within the wound bed. Assessment Active Problems ICD-10 Type 2 diabetes mellitus with  foot ulcer Charcot's joint, left ankle and foot Non-pressure chronic ulcer of other part of left foot with fat layer exposed Charcot's joint, right ankle and foot Procedures Wound #2 Pre-procedure diagnosis of Wound #2 is a Pressure Ulcer located on the Left,Dorsal Foot . There was a Chemical/Enzymatic/Mechanical debridement performed by Montey Hora, RN. With the following instrument(s): gauze and saline after achieving pain control using Lidocaine 4% Topical Solution.  Other agent used was Gauze and saline. A time out was conducted at 10:37, prior to the start of the procedure. There was no bleeding. The procedure was tolerated well with a pain level of 0 throughout and a pain level of 0 following the procedure. Post Debridement Measurements: 1.5cm length x 4cm width x 0.1cm depth; 0.471cm^3 volume. Post debridement Stage noted as Category/Stage III. Character of Wound/Ulcer Post Debridement is improved. Post procedure Diagnosis Wound #2: Same as Pre-Procedure Audrey Garrett, Audrey Garrett. ([id number]) Plan Wound Cleansing: Wound #1 Left,Medial Foot: Clean wound with Normal Saline. Dial antibacterial soap, wash wounds, rinse and pat dry prior to dressing wounds May Shower, gently pat wound dry prior to applying new dressing. Wound #2 Left,Dorsal Foot: Clean wound with Normal Saline. Dial antibacterial soap, wash wounds, rinse and pat dry prior to dressing wounds May Shower, gently pat wound dry prior to applying new dressing. Wound #3 Left Toe Great: Clean wound with Normal Saline. Dial antibacterial soap, wash wounds, rinse and pat dry prior to dressing wounds May Shower, gently pat wound dry prior to applying new dressing. Primary Wound Dressing: Wound #1 Left,Medial Foot: Silver Alginate Wound #2 Left,Dorsal Foot: Silver Alginate Wound #3 Left Toe Great: Silver Alginate Secondary Dressing: Wound #1 Left,Medial Foot: Boardered Foam Dressing - coverlet or bandaid on toe Wound #2  Left,Dorsal Foot: Boardered Foam Dressing - coverlet or bandaid on toe Wound #3 Left Toe Great: Boardered Foam Dressing - coverlet or bandaid on toe Dressing Change Frequency: Change dressing every other day. Follow-up Appointments: Return Appointment in 1 week. Off-Loading: Removable cast walker boot - per orthopedic 1. My suggestion currently is gonna be that we initiate a continuation of treatment with a Siraj and a dressing this is similar to the border phone that was utilize previous I still think that could be a possibility as well. Otherwise she does have two new wounds one on the dorsal surface of the foot secondary to the cast rubbing and the second is her great toe where she struck this on something in the past day. Nonetheless something related to the cast but still is a small the wound for which we did measure and count today in order to monitor and ensure this completely heals up. 2. As mentioned in the assessment the patient will actually be going to [facility] with beaks they and according to town they will move the cast in place the patient in a removal boot until she can come back to see me. 3. I do think that the total contact cast will be a better way offload on the good part about this is that with the boot section on she can actually walk on this law still offloading which I think will be also more agreeable for the patient as well. Please see above for specific wound care orders. We will see patient for re-evaluation in 1 week(s) here in the clinic. If anything worsens or changes patient will contact our office for additional recommendations. Audrey Garrett, Audrey Garrett ([id number]) Electronic Signature(s) Signed: 05/02/2019 1:07:50 AM By: Worthy Keeler PA-C Entered By: Worthy Keeler on 05/02/2019 01:06:55 Audrey Garrett ([id number]) -------------------------------------------------------------------------------- ROS/PFSH Details Patient Name: Audrey Garrett Date of Service:  05/01/2019 9:45 AM Medical Record Number: [id number] Patient Account Number: [id number] Date of Birth/Sex: 1960/09/08 (59 y.o. F) Treating RN: Army Melia Primary Care Provider: Pernell Dupre Other Clinician: Referring Provider: Hannibal Cellar Treating Provider/Extender: Melburn Hake, Marquay Kruse Weeks in Treatment: 0 Information Obtained From Patient Constitutional Symptoms (General Health)  Complaints and Symptoms: Negative for: Fatigue; Fever; Chills; Marked Weight Change Eyes Complaints and Symptoms: Negative for: Dry Eyes; Vision Changes; Glasses / Contacts Medical History: Negative for: Cataracts; Glaucoma; Optic Neuritis Ear/Nose/Mouth/Throat Complaints and Symptoms: Negative for: Difficult clearing ears; Sinusitis Medical History: Negative for: Chronic sinus problems/congestion; Middle ear problems Hematologic/Lymphatic Complaints and Symptoms: Negative for: Bleeding / Clotting Disorders; Human Immunodeficiency Virus Medical History: Negative for: Anemia; Hemophilia; Human Immunodeficiency Virus; Lymphedema; Sickle Cell Disease Respiratory Complaints and Symptoms: Negative for: Chronic or frequent coughs; Shortness of Breath Medical History: Positive for: Chronic Obstructive Pulmonary Disease (COPD) Negative for: Aspiration; Asthma; Pneumothorax; Sleep Apnea; Tuberculosis Cardiovascular Complaints and Symptoms: Negative for: Chest pain; LE edema Medical History: Negative for: Angina; Arrhythmia; Congestive Heart Failure; Coronary Artery Disease; Deep Vein Thrombosis; Hypertension; Hypotension; Myocardial Infarction; Peripheral Arterial Disease; Peripheral Venous Disease; Phlebitis; Vasculitis Audrey Garrett, Audrey Garrett ([id number]) Gastrointestinal Complaints and Symptoms: Negative for: Frequent diarrhea; Nausea; Vomiting Medical History: Positive for: Cirrhosis Negative for: Colitis; Crohnos; Hepatitis A; Hepatitis B; Hepatitis C Endocrine Complaints and Symptoms: Negative for:  Hepatitis; Thyroid disease; Polydypsia (Excessive Thirst) Medical History: Positive for: Type II Diabetes Time with diabetes: 5 years Treated with: Insulin, Oral agents Blood sugar tested every day: Yes Tested : Genitourinary Complaints and Symptoms: Negative for: Kidney failure/ Dialysis; Incontinence/dribbling Review of System Notes: hx kidney cancer Medical History: Negative for: End Stage Renal Disease Immunological Complaints and Symptoms: Negative for: Hives; Itching Medical History: Negative for: Lupus Erythematosus; Raynaudos; Scleroderma Integumentary (Skin) Complaints and Symptoms: Positive for: Wounds Negative for: Bleeding or bruising tendency; Breakdown; Swelling Medical History: Negative for: History of Burn; History of pressure wounds Musculoskeletal Complaints and Symptoms: Negative for: Muscle Pain; Muscle Weakness Medical History: Negative for: Gout; Rheumatoid Arthritis; Osteoarthritis; Osteomyelitis Neurologic Complaints and Symptoms: Negative for: Numbness/parasthesias; Focal/Weakness Audrey Garrett, Audrey Garrett ([id number]) Medical History: Negative for: Dementia; Neuropathy; Quadriplegia; Paraplegia; Seizure Disorder Psychiatric Complaints and Symptoms: Negative for: Anxiety; Claustrophobia Medical History: Negative for: Anorexia/bulimia; Confinement Anxiety Oncologic Complaints and Symptoms: Review of System Notes: bilateral lung cancer Medical History: Positive for: Received Radiation Negative for: Received Chemotherapy Immunizations Pneumococcal Vaccine: Received Pneumococcal Vaccination: No Implantable Devices None Hospitalization / Surgery History Type of Hospitalization/Surgery foot surgery at [facility] 2020 Family and Social History Cancer: Yes - Siblings; Diabetes: Yes - Father; Heart Disease: No; Hereditary Spherocytosis: No; Hypertension: Yes - Mother; Kidney Disease: No; Lung Disease: No; Seizures: No; Stroke: Yes - Mother; Thyroid Problems:  No; Tuberculosis: No; Current every day smoker - 45 years; Marital Status - Married; Alcohol Use: Daily; Drug Use: Prior History; Caffeine Use: Never; Financial Concerns: No; Food, Clothing or Shelter Needs: No; Support System Lacking: No; Transportation Concerns: No Electronic Signature(s) Signed: 05/01/2019 11:23:06 AM By: Army Melia Signed: 05/02/2019 1:07:50 AM By: Worthy Keeler PA-C Entered By: Army Melia on 05/01/2019 10:07:17 Audrey Garrett ([id number]) -------------------------------------------------------------------------------- SuperBill Details Patient Name: Audrey Garrett Date of Service: 05/01/2019 Medical Record Number: [id number] Patient Account Number: [id number] Date of Birth/Sex: 1960-09-22 (59 y.o. F) Treating RN: Montey Hora Primary Care Provider: Pernell Dupre Other Clinician: Referring Provider: Walker Cellar Treating Provider/Extender: Melburn Hake, Machael Raine Weeks in Treatment: 0 Diagnosis Coding ICD-10 Codes Code Description E11.621 Type 2 diabetes mellitus with foot ulcer M14.672 Charcot's joint, left ankle and foot L97.522 Non-pressure chronic ulcer of other part of left foot with fat layer exposed M14.671 Charcot's joint, right ankle and foot Facility Procedures CPT4 Code: 29518841 Description: 66063 - WOUND CARE VISIT-LEV 4 NEW PT Modifier: Quantity: 1 Physician Procedures CPT4 Code: [id number]  Description: 99214 - WC PHYS LEVEL 4 - EST PT ICD-10 Diagnosis Description E11.621 Type 2 diabetes mellitus with foot ulcer M14.672 Charcot's joint, left ankle and foot L97.522 Non-pressure chronic ulcer of other part of left foot with fa M14.671  Charcot's joint, right ankle and foot Modifier: t layer exposed Quantity: 1 Electronic Signature(s) Signed: 05/02/2019 1:07:50 AM By: Worthy Keeler PA-C Previous Signature: 05/01/2019 4:55:53 PM Version By: Montey Hora Entered By: Worthy Keeler on 05/02/2019 01:07:29

## 2019-05-08 ENCOUNTER — Encounter: Payer: Medicare Other | Admitting: Physician Assistant

## 2019-05-08 ENCOUNTER — Other Ambulatory Visit: Payer: Self-pay

## 2019-05-08 DIAGNOSIS — E11621 Type 2 diabetes mellitus with foot ulcer: Secondary | ICD-10-CM | POA: Diagnosis not present

## 2019-05-08 NOTE — Progress Notes (Addendum)
Audrey, Garrett ([id number]) Visit Report for 05/08/2019 Chief Complaint Document Details Patient Name: Audrey Garrett, Audrey Garrett. Date of Service: 05/08/2019 10:45 AM Medical Record Number: [id number] Patient Account Number: [id number] Date of Birth/Sex: Aug 14, 1960 (59 y.o. F) Treating RN: Montey Hora Primary Care Provider: Pernell Dupre Other Clinician: Referring Provider: Pernell Dupre Treating Provider/Extender: Melburn Hake, Jessup Ogas Weeks in Treatment: 1 Information Obtained from: Patient Chief Complaint Left foot and toe ulcers Electronic Signature(s) Signed: 05/08/2019 10:41:08 AM By: Worthy Keeler PA-C Entered By: Worthy Keeler on 05/08/2019 10:41:07 Audrey Garrett ([id number]) -------------------------------------------------------------------------------- HPI Details Patient Name: Audrey Garrett Date of Service: 05/08/2019 10:45 AM Medical Record Number: [id number] Patient Account Number: [id number] Date of Birth/Sex: Jun 11, 1960 (59 y.o. F) Treating RN: Montey Hora Primary Care Provider: Pernell Dupre Other Clinician: Referring Provider: Pernell Dupre Treating Provider/Extender: Melburn Hake, Tamiyah Moulin Weeks in Treatment: 1 History of Present Illness HPI Description: 05/01/19 upon evaluation today patient presents for initial inspection here in our clinic as a result of having issues with Charcot foot bilaterally. She is actually a patient at the [facility] orthopedic clinic in [facility]. She has been seeing Dr. Yolo Cellar and she did undergo surgery for the Charcot foot on the right recently. This is still being managed by Dr. Andree Elk at this point. Fortunately she seems to be healing well however. Unfortunately she also has the wound on the medial aspect of her left foot right in the arch word has collapsed. This is been an issue for her and unfortunately just with the way she was walking there apparently was keeping things propagated as far as my conversation with  the provider at [facility] would be to cause concerned they mainly put the past on in order to help with patient compliance with appropriate offloading. Nonetheless I do believe unfortunately the cast has become somewhat tight on the distal portion of the patient's foot this is actually cutting into her foot causing a secondary wound different from the original wound. Her toes are also somewhat discolored reddish in color not cyanotic but nonetheless I feel like the swelling and edema in the distal portion of her foot is an issue at this point. With that being said I do think the patient could be a candidate for total contact cast although I do believe we need to know one get the current cast-off and number two ensure that her toes are okay and there's no signs of anything worsening before we look at putting in the total contact cast. Nonetheless I think this would be appropriate for offloading and in fact probably the best way to go because she can actually walk with the walking boot on in the total contact cast which will allow her to continue to function daily without causing additional injury and harm to her foot. The patient does have a history of diabetes, bilateral Charcot foot for which she's had surgery on the right and is pending surgery on the left wants the right hills enough. Her most recent hemoglobin A-1 C was 6.7 on 01/24/19. In the course of today's treatment I did actually speak with Tam who is the physician assistant that works with Dr. [place] Cellar. That will be detailed in the plan. 05/08/2019 on evaluation today patient actually appears to be doing exceedingly better compared to last week. She did end up going back to see Tam at [place] they did remove the cast and actually placed on antibiotic though the patient is unsure what the antibiotic was.  Subsequently she overall seems to be doing significantly better as far as her wounds are concerned the original wound for which she  was sent to [place] is actually healed the other wounds which were caused by the cast and trauma actually also seem to be doing better in general I am very pleased with how the patient has progressed in the past week. She does have a cam walker boot which actually seems to be doing quite well for her on the left as well this pretty much matches the right side mood she had post surgery. Either way I am extremely pleased and I do not even think we can need to proceed with a total contact cast as of today I think what she has currently is probably can be sufficient considering the improvement she is made in just 1 week's time. Electronic Signature(s) Signed: 05/08/2019 11:07:25 AM By: Worthy Keeler PA-C Entered By: Worthy Keeler on 05/08/2019 11:07:24 Audrey Garrett ([id number]) -------------------------------------------------------------------------------- Physical Exam Details Patient Name: Audrey Garrett Date of Service: 05/08/2019 10:45 AM Medical Record Number: [id number] Patient Account Number: [id number] Date of Birth/Sex: [birth year] (59 y.o. F) Treating RN: Montey Hora Primary Care Provider: Pernell Dupre Other Clinician: Referring Provider: Pernell Dupre Treating Provider/Extender: Melburn Hake, Dalma Panchal Weeks in Treatment: 1 Constitutional Well-nourished and well-hydrated in no acute distress. Respiratory normal breathing without difficulty. Psychiatric this patient is able to make decisions and demonstrates good insight into disease process. Alert and Oriented x 3. pleasant and cooperative. Notes [name] wound bed currently showed signs of at all locations either healing or showing signs of significant improvement in general I am very pleased with how things seem to be progressing. She does still have some warmth just above the ankle I think she should definitely consider continuing the antibiotic she tells me she has some more in the bottle in her pillbox is full currently it  sounds like she does have at least likely 10 to 14 days worth of medication which is appropriate. Otherwise the wound beds all seem to be doing significantly better and very pleased in this regard. Electronic Signature(s) Signed: 05/08/2019 11:07:58 AM By: Worthy Keeler PA-C Entered By: Worthy Keeler on 05/08/2019 11:07:57 Audrey Garrett ([id number]) -------------------------------------------------------------------------------- Physician Orders Details Patient Name: Audrey Garrett Date of Service: 05/08/2019 10:45 AM Medical Record Number: [id number] Patient Account Number: [id number] Date of Birth/Sex: Jan 17, 1961 (59 y.o. F) Treating RN: Montey Hora Primary Care Provider: Pernell Dupre Other Clinician: Referring Provider: Pernell Dupre Treating Provider/Extender: Melburn Hake, Alecea Trego Weeks in Treatment: 1 Verbal / Phone Orders: No Diagnosis Coding ICD-10 Coding Code Description E11.621 Type 2 diabetes mellitus with foot ulcer M14.672 Charcot's joint, left ankle and foot L97.522 Non-pressure chronic ulcer of other part of left foot with fat layer exposed M14.671 Charcot's joint, right ankle and foot Wound Cleansing Wound #2 Left,Dorsal Foot o Clean wound with Normal Saline. o Dial antibacterial soap, wash wounds, rinse and pat dry prior to dressing wounds o May Shower, gently pat wound dry prior to applying new dressing. Wound #3 Left Toe Great o Clean wound with Normal Saline. o Dial antibacterial soap, wash wounds, rinse and pat dry prior to dressing wounds o May Shower, gently pat wound dry prior to applying new dressing. Primary Wound Dressing Wound #2 Left,Dorsal Foot o Silver Alginate Wound #3 Left Toe Great o Silver Alginate Secondary Dressing Wound #2 Left,Dorsal Foot o Boardered Foam Dressing - coverlet or bandaid on toe - bordered foam on medial  foot for protection Wound #3 Left Toe Great o Boardered Foam Dressing - coverlet or bandaid on  toe - bordered foam on medial foot for protection Dressing Change Frequency o Change dressing every other day. Follow-up Appointments o Return Appointment in 1 week. Off-Loading o Removable cast walker boot - per orthopedic Electronic Signature(s) HAYDE, KILGOUR ([id number]) Signed: 05/08/2019 11:40:44 AM By: Worthy Keeler PA-C Signed: 05/08/2019 4:13:47 PM By: Montey Hora Entered By: Montey Hora on 05/08/2019 10:58:45 Audrey Garrett ([id number]) -------------------------------------------------------------------------------- Problem List Details Patient Name: Audrey Garrett Date of Service: 05/08/2019 10:45 AM Medical Record Number: [id number] Patient Account Number: [id number] Date of Birth/Sex: 1960/08/12 (59 y.o. F) Treating RN: Montey Hora Primary Care Provider: Pernell Dupre Other Clinician: Referring Provider: Pernell Dupre Treating Provider/Extender: Melburn Hake, Javin Nong Weeks in Treatment: 1 Active Problems ICD-10 Evaluated Encounter Code Description Active Date Today Diagnosis E11.621 Type 2 diabetes mellitus with foot ulcer 05/02/2019 No Yes M14.672 Charcot's joint, left ankle and foot 05/02/2019 No Yes L97.522 Non-pressure chronic ulcer of other part of left foot with fat 05/02/2019 No Yes layer exposed M14.671 Charcot's joint, right ankle and foot 05/02/2019 No Yes Inactive Problems Resolved Problems Electronic Signature(s) Signed: 05/08/2019 10:41:00 AM By: Worthy Keeler PA-C Entered By: Worthy Keeler on 05/08/2019 10:41:00 Audrey Garrett ([id number]) -------------------------------------------------------------------------------- Progress Note Details Patient Name: Audrey Garrett Date of Service: 05/08/2019 10:45 AM Medical Record Number: [id number] Patient Account Number: [id number] Date of Birth/Sex: 11-14-1960 (59 y.o. F) Treating RN: Montey Hora Primary Care Provider: Pernell Dupre Other Clinician: Referring Provider: Pernell Dupre Treating Provider/Extender: Melburn Hake, Joram Venson Weeks in Treatment: 1 Subjective Chief Complaint Information obtained from Patient Left foot and toe ulcers History of Present Illness (HPI) 05/01/19 upon evaluation today patient presents for initial inspection here in our clinic as a result of having issues with Charcot foot bilaterally. She is actually a patient at the [facility] orthopedic clinic in [facility]. She has been seeing Dr. [place] Cellar and she did undergo surgery for the Charcot foot on the right recently. This is still being managed by Dr. Andree Elk at this point. Fortunately she seems to be healing well however. Unfortunately she also has the wound on the medial aspect of her left foot right in the arch word has collapsed. This is been an issue for her and unfortunately just with the way she was walking there apparently was keeping things propagated as far as my conversation with the provider at [facility] would be to cause concerned they mainly put the past on in order to help with patient compliance with appropriate offloading. Nonetheless I do believe unfortunately the cast has become somewhat tight on the distal portion of the patient's foot this is actually cutting into her foot causing a secondary wound different from the original wound. Her toes are also somewhat discolored reddish in color not cyanotic but nonetheless I feel like the swelling and edema in the distal portion of her foot is an issue at this point. With that being said I do think the patient could be a candidate for total contact cast although I do believe we need to know one get the current cast-off and number two ensure that her toes are okay and there's no signs of anything worsening before we look at putting in the total contact cast. Nonetheless I think this would be appropriate for offloading and in fact probably the best way to go because she can actually walk with the walking boot  on in the  total contact cast which will allow her to continue to function daily without causing additional injury and harm to her foot. The patient does have a history of diabetes, bilateral Charcot foot for which she's had surgery on the right and is pending surgery on the left wants the right hills enough. Her most recent hemoglobin A-1 C was 6.7 on 01/24/19. In the course of today's treatment I did actually speak with Tam who is the physician assistant that works with Dr. [place] Cellar. That will be detailed in the plan. 05/08/2019 on evaluation today patient actually appears to be doing exceedingly better compared to last week. She did end up going back to see Tam at [place] they did remove the cast and actually placed on antibiotic though the patient is unsure what the antibiotic was. Subsequently she overall seems to be doing significantly better as far as her wounds are concerned the original wound for which she was sent to [place] is actually healed the other wounds which were caused by the cast and trauma actually also seem to be doing better in general I am very pleased with how the patient has progressed in the past week. She does have a cam walker boot which actually seems to be doing quite well for her on the left as well this pretty much matches the right side mood she had post surgery. Either way I am extremely pleased and I do not even think we can need to proceed with a total contact cast as of today I think what she has currently is probably can be sufficient considering the improvement she is made in just 1 week's time. Objective Constitutional Well-nourished and well-hydrated in no acute distress. Audrey Garrett ([id number]) Vitals Time Taken: 10:35 AM, Height: 70 in, Weight: 180 lbs, BMI: 25.8, Temperature: 99.4 F, Pulse: 93 bpm, Respiratory Rate: 16 breaths/min, Blood Pressure: 107/69 mmHg. Respiratory normal breathing without difficulty. Psychiatric this patient is able to make  decisions and demonstrates good insight into disease process. Alert and Oriented x 3. pleasant and cooperative. General Notes: [name] wound bed currently showed signs of at all locations either healing or showing signs of significant improvement in general I am very pleased with how things seem to be progressing. She does still have some warmth just above the ankle I think she should definitely consider continuing the antibiotic she tells me she has some more in the bottle in her pillbox is full currently it sounds like she does have at least likely 10 to 14 days worth of medication which is appropriate. Otherwise the wound beds all seem to be doing significantly better and very pleased in this regard. Integumentary (Hair, Skin) Wound #1 status is Healed - Epithelialized. Original cause of wound was Pressure Injury. The wound is located on the Left,Medial Foot. The wound measures 0cm length x 0cm width x 0cm depth; 0cm^2 area and 0cm^3 volume. The wound is limited to skin breakdown. There is a medium amount of serous drainage noted. The wound margin is flat and intact. There is large (67-100%) pink granulation within the wound bed. There is no necrotic tissue within the wound bed. Wound #2 status is Open. Original cause of wound was Gradually Appeared. The wound is located on the Left,Dorsal Foot. The wound measures 0.9cm length x 3.2cm width x 0.1cm depth; 2.262cm^2 area and 0.226cm^3 volume. There is Fat Layer (Subcutaneous Tissue) Exposed exposed. There is a medium amount of serous drainage noted. The wound margin is  flat and intact. There is small (1-33%) pink granulation within the wound bed. There is a large (67-100%) amount of necrotic tissue within the wound bed including Adherent Slough. Wound #3 status is Open. Original cause of wound was Gradually Appeared. The wound is located on the Left Toe Great. The wound measures 0.2cm length x 0.2cm width x 0.1cm depth; 0.031cm^2 area and  0.003cm^3 volume. There is Fat Layer (Subcutaneous Tissue) Exposed exposed. There is a medium amount of serous drainage noted. The wound margin is flat and intact. There is large (67-100%) pink granulation within the wound bed. There is no necrotic tissue within the wound bed. Assessment Active Problems ICD-10 Type 2 diabetes mellitus with foot ulcer Charcot's joint, left ankle and foot Non-pressure chronic ulcer of other part of left foot with fat layer exposed Charcot's joint, right ankle and foot Plan Wound Cleansing: Wound #2 Left,Dorsal Foot: VENEDA, KIRKSEY B. ([id number]) Clean wound with Normal Saline. Dial antibacterial soap, wash wounds, rinse and pat dry prior to dressing wounds May Shower, gently pat wound dry prior to applying new dressing. Wound #3 Left Toe Great: Clean wound with Normal Saline. Dial antibacterial soap, wash wounds, rinse and pat dry prior to dressing wounds May Shower, gently pat wound dry prior to applying new dressing. Primary Wound Dressing: Wound #2 Left,Dorsal Foot: Silver Alginate Wound #3 Left Toe Great: Silver Alginate Secondary Dressing: Wound #2 Left,Dorsal Foot: Boardered Foam Dressing - coverlet or bandaid on toe - bordered foam on medial foot for protection Wound #3 Left Toe Great: Boardered Foam Dressing - coverlet or bandaid on toe - bordered foam on medial foot for protection Dressing Change Frequency: Change dressing every other day. Follow-up Appointments: Return Appointment in 1 week. Off-Loading: Removable cast walker boot - per orthopedic 1. I am going to recommend currently that we continue with a silver alginate dressing she did receive her supplies. 2. I would recommend as well that she continue with the cam walker boot that she was given at orthopedics that seems to done extremely well for her she has been compliant with it wearing it anytime she gets up to walk and to be perfectly honest I think it is done just as well as  what we could consider doing with a total contact cast. 3. I also feel like that the patient's boot is somewhat rubbing on the medial ankle on the left and therefore we are going to pad this area although I do not see any major complications at this point. Thus the only downside I see of the boot thus far. We will see patient back for reevaluation in 1 week here in the clinic. If anything worsens or changes patient will contact our office for additional recommendations. If we need to consider a total contact cast at some point in the future we can right now I feel like she is extremely compliant with what she has on. Electronic Signature(s) Signed: 05/08/2019 11:09:09 AM By: Worthy Keeler PA-C Entered By: Worthy Keeler on 05/08/2019 11:09:08 Audrey Garrett ([id number]) -------------------------------------------------------------------------------- SuperBill Details Patient Name: Audrey Garrett Date of Service: 05/08/2019 Medical Record Number: [id number] Patient Account Number: [id number] Date of Birth/Sex: 1960-11-26 (59 y.o. F) Treating RN: Montey Hora Primary Care Provider: Pernell Dupre Other Clinician: Referring Provider: Pernell Dupre Treating Provider/Extender: Melburn Hake, Keeghan Mcintire Weeks in Treatment: 1 Diagnosis Coding ICD-10 Codes Code Description E11.621 Type 2 diabetes mellitus with foot ulcer M14.672 Charcot's joint, left ankle and foot L97.522 Non-pressure chronic ulcer of other  part of left foot with fat layer exposed M14.671 Charcot's joint, right ankle and foot Facility Procedures CPT4 Code: [id number] Description: 99214 - WOUND CARE VISIT-LEV 4 EST PT Modifier: Quantity: 1 Physician Procedures CPT4 Code: [id number] Description: 28366 - WC PHYS LEVEL 3 - EST PT ICD-10 Diagnosis Description E11.621 Type 2 diabetes mellitus with foot ulcer M14.672 Charcot's joint, left ankle and foot L97.522 Non-pressure chronic ulcer of other part of left foot with fa M14.671   Charcot's joint, right ankle and foot Modifier: t layer exposed Quantity: 1 Electronic Signature(s) Signed: 05/08/2019 11:09:24 AM By: Worthy Keeler PA-C Entered By: Worthy Keeler on 05/08/2019 11:09:23

## 2019-05-09 ENCOUNTER — Telehealth: Payer: Self-pay | Admitting: Oncology

## 2019-05-09 ENCOUNTER — Inpatient Hospital Stay: Payer: Medicare Other

## 2019-05-09 NOTE — Telephone Encounter (Signed)
Patient phoned and stated that she would not be able to attend her infusion appt on 05-09-19. 05-09-19 appt rescheduled for 05-23-19.

## 2019-05-09 NOTE — Progress Notes (Addendum)
STARKISHA, TULLIS ([id number]) Visit Report for 05/08/2019 Arrival Information Details Patient Name: Audrey Garrett, Audrey Garrett. Date of Service: 05/08/2019 10:45 AM Medical Record Number: [id number] Patient Account Number: [id number] Date of Birth/Sex: February 12, 1961 (59 y.o. F) Treating RN: Montey Hora Primary Care Annai Heick: Pernell Dupre Other Clinician: Referring Tedrick Port: Pernell Dupre Treating Jonathon Tan/Extender: Melburn Hake, HOYT Weeks in Treatment: 1 Visit Information History Since Last Visit Added or deleted any medications: No Patient Arrived: Wheel Chair Any new allergies or adverse reactions: No Arrival Time: 10:34 Had a fall or experienced change in No Accompanied By: husband activities of daily living that may affect Transfer Assistance: None risk of falls: Patient Identification Verified: Yes Signs or symptoms of abuse/neglect since last visito No Secondary Verification Process Completed: Yes Hospitalized since last visit: No Patient Has Alerts: Yes Implantable device outside of the clinic excluding No Patient Alerts: DMII cellular tissue based products placed in the center since last visit: Pain Present Now: Yes Electronic Signature(s) Signed: 05/08/2019 1:29:53 PM By: Lorine Bears RCP, RRT, CHT Entered By: Lorine Bears on 05/08/2019 10:35:01 Audrey Garrett ([id number]) -------------------------------------------------------------------------------- Clinic Level of Care Assessment Details Patient Name: Audrey Garrett Date of Service: 05/08/2019 10:45 AM Medical Record Number: [id number] Patient Account Number: [id number] Date of Birth/Sex: 11-15-1960 (59 y.o. F) Treating RN: Montey Hora Primary Care Sadie Hazelett: Pernell Dupre Other Clinician: Referring Garyson Stelly: Pernell Dupre Treating Marian Meneely/Extender: Melburn Hake, HOYT Weeks in Treatment: 1 Clinic Level of Care Assessment Items TOOL 4 Quantity Score []  - Use when only an EandM is  performed on FOLLOW-UP visit 0 ASSESSMENTS - Nursing Assessment / Reassessment X - Reassessment of Co-morbidities (includes updates in patient status) 1 10 X- 1 5 Reassessment of Adherence to Treatment Plan ASSESSMENTS - Wound and Skin Assessment / Reassessment []  - Simple Wound Assessment / Reassessment - one wound 0 X- 3 5 Complex Wound Assessment / Reassessment - multiple wounds []  - 0 Dermatologic / Skin Assessment (not related to wound area) ASSESSMENTS - Focused Assessment []  - Circumferential Edema Measurements - multi extremities 0 []  - 0 Nutritional Assessment / Counseling / Intervention X- 1 5 Lower Extremity Assessment (monofilament, tuning fork, pulses) []  - 0 Peripheral Arterial Disease Assessment (using hand held doppler) ASSESSMENTS - Ostomy and/or Continence Assessment and Care []  - Incontinence Assessment and Management 0 []  - 0 Ostomy Care Assessment and Management (repouching, etc.) PROCESS - Coordination of Care X - Simple Patient / Family Education for ongoing care 1 15 []  - 0 Complex (extensive) Patient / Family Education for ongoing care X- 1 10 Staff obtains Programmer, systems, Records, Test Results / Process Orders []  - 0 Staff telephones HHA, Nursing Homes / Clarify orders / etc []  - 0 Routine Transfer to another Facility (non-emergent condition) []  - 0 Routine Hospital Admission (non-emergent condition) []  - 0 New Admissions / Biomedical engineer / Ordering NPWT, Apligraf, etc. []  - 0 Emergency Hospital Admission (emergent condition) X- 1 10 Simple Discharge Coordination []  - 0 Complex (extensive) Discharge Coordination PROCESS - Special Needs []  - Pediatric / Minor Patient Management 0 []  - 0 Isolation Patient Management []  - 0 Hearing / Language / Visual special needs []  - 0 Assessment of Community assistance (transportation, D/C planning, etc.) Audrey Garrett, Audrey B. ([id number]) []  - 0 Additional assistance / Altered mentation []  - 0 Support  Surface(s) Assessment (bed, cushion, seat, etc.) INTERVENTIONS - Wound Cleansing / Measurement []  - Simple Wound Cleansing - one wound 0 X- 3 5 Complex Wound Cleansing - multiple wounds X-  1 5 Wound Imaging (photographs - any number of wounds) []  - 0 Wound Tracing (instead of photographs) []  - 0 Simple Wound Measurement - one wound X- 3 5 Complex Wound Measurement - multiple wounds INTERVENTIONS - Wound Dressings X - Small Wound Dressing one or multiple wounds 2 10 []  - 0 Medium Wound Dressing one or multiple wounds []  - 0 Large Wound Dressing one or multiple wounds []  - 0 Application of Medications - topical []  - 0 Application of Medications - injection INTERVENTIONS - Miscellaneous []  - External ear exam 0 []  - 0 Specimen Collection (cultures, biopsies, blood, body fluids, etc.) []  - 0 Specimen(s) / Culture(s) sent or taken to Lab for analysis []  - 0 Patient Transfer (multiple staff / Civil Service fast streamer / Similar devices) []  - 0 Simple Staple / Suture removal (25 or less) []  - 0 Complex Staple / Suture removal (26 or more) []  - 0 Hypo / Hyperglycemic Management (close monitor of Blood Glucose) []  - 0 Ankle / Brachial Index (ABI) - do not check if billed separately X- 1 5 Vital Signs Has the patient been seen at the hospital within the last three years: Yes Total Score: 130 Level Of Care: New/Established - Level 4 Electronic Signature(s) Signed: 05/08/2019 4:13:47 PM By: Montey Hora Entered By: Montey Hora on 05/08/2019 10:59:14 Audrey Garrett ([id number]) -------------------------------------------------------------------------------- Encounter Discharge Information Details Patient Name: Audrey Garrett Date of Service: 05/08/2019 10:45 AM Medical Record Number: [id number] Patient Account Number: [id number] Date of Birth/Sex: 1960/06/16 (59 y.o. F) Treating RN: Montey Hora Primary Care Tinia Oravec: Pernell Dupre Other Clinician: Referring Teofilo Lupinacci: Pernell Dupre Treating [place] Gustafson/Extender: Melburn Hake, HOYT Weeks in Treatment: 1 Encounter Discharge Information Items Discharge Condition: Stable Ambulatory Status: Wheelchair Discharge Destination: Home Transportation: Private Auto Accompanied By: husband Schedule Follow-up Appointment: Yes Clinical Summary of Care: Electronic Signature(s) Signed: 05/08/2019 4:13:47 PM By: Montey Hora Entered By: Montey Hora on 05/08/2019 11:00:50 Audrey Garrett ([id number]) -------------------------------------------------------------------------------- Lower Extremity Assessment Details Patient Name: Audrey Garrett Date of Service: 05/08/2019 10:45 AM Medical Record Number: [id number] Patient Account Number: [id number] Date of Birth/Sex: 1960/12/17 (59 y.o. F) Treating RN: Army Melia Primary Care Meighan Treto: Pernell Dupre Other Clinician: Referring Rhoda Waldvogel: Pernell Dupre Treating Sohaib Vereen/Extender: Melburn Hake, HOYT Weeks in Treatment: 1 Edema Assessment Assessed: [Left: No] [Right: No] Edema: [Left: Ye] [Right: s] Calf Left: Right: Point of Measurement: 32 cm From Medial Instep 35.5 cm cm Ankle Left: Right: Point of Measurement: 10 cm From Medial Instep 27.5 cm cm Vascular Assessment Pulses: Dorsalis Pedis Palpable: [Left:Yes] Electronic Signature(s) Signed: 05/09/2019 11:18:27 AM By: Army Melia Entered By: Army Melia on 05/08/2019 10:44:47 Audrey Garrett ([id number]) -------------------------------------------------------------------------------- Multi Wound Chart Details Patient Name: Audrey Garrett Date of Service: 05/08/2019 10:45 AM Medical Record Number: [id number] Patient Account Number: [id number] Date of Birth/Sex: March 17, 1961 (59 y.o. 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 Comorbid History: Chronic Obstructive Pulmonary Chronic Obstructive Pulmonary Chronic Obstructive Pulmonary Disease (COPD), Cirrhosis , Type II Disease (COPD), Cirrhosis , Type II Disease (COPD), Cirrhosis , Type  II Diabetes, Received Radiation Diabetes, Received Radiation Diabetes, Received Radiation Date Acquired: 02/27/2019 04/24/2019 04/26/2019 Weeks of Treatment: 1 1 1  Wound Status: Healed - Epithelialized Open Open Measurements L x W x D (cm) 0x0x0 0.9x3.2x0.1 0.2x0.2x0.1 Area (cm) : 0 2.262 0.031 Volume (cm) : 0 0.226 0.003 % Reduction in Area: 100.00% 52.00% 56.30% % Reduction in Volume: 100.00% 52.00% 57.10% Classification: Grade 1 Grade 1 Grade 1 Exudate Amount: Medium Medium Medium Exudate Type: Serous Serous Serous Exudate Color: amber amber amber Wound Margin: Flat and Intact Flat and Intact Flat and Intact Granulation Amount: Large (67-100%) Small (1-33%) Large (67-100%) Granulation Quality: Pink Pink Pink Necrotic Amount: None Present (0%) Large (67-100%) None Present (0%) Exposed Structures: Fascia: No Fat Layer (Subcutaneous Tissue) Fat Layer (Subcutaneous Tissue) Fat Layer (Subcutaneous Tissue) Exposed: Yes Exposed: Yes Exposed: No Fascia: No Fascia: No Tendon: No Tendon: No Tendon: No Muscle: No Muscle: No Muscle: No Joint: No Joint: No Joint: No Bone: No Bone: No Bone: No Limited to Skin Breakdown Epithelialization: None None None Treatment Notes Electronic Signature(s) Signed: 05/08/2019 4:13:47 PM By: Everardo All ([id number]) Entered By: Montey Hora on 05/08/2019 10:56:51 Audrey Garrett ([id number]) -------------------------------------------------------------------------------- Multi-Disciplinary Care Plan Details Patient Name: Audrey Garrett Date of Service: 05/08/2019 10:45 AM Medical Record Number: [id number] Patient Account Number: [id number] Date of Birth/Sex: Sep 01, 1960 (59 y.o. F) Treating RN: Montey Hora Primary Care Shinichi Anguiano: Pernell Dupre Other Clinician: Referring Antoine Fiallos: Pernell Dupre Treating Shalona Harbour/Extender: Melburn Hake, HOYT Weeks in Treatment: 1 Active Inactive Electronic Signature(s) Signed: 05/21/2019 9:39:28 AM By: Gretta Cool BSN, RN, CWS, Kim RN, BSN Signed: 06/05/2019 10:47:06 AM By: Montey Hora Previous Signature: 05/08/2019 4:13:47 PM Version By: Montey Hora Entered By: Gretta Cool BSN, RN, CWS, Kim on 05/21/2019 09:39:28 Audrey Garrett ([id number]) -------------------------------------------------------------------------------- Pain Assessment Details Patient Name: Audrey Garrett, Audrey Garrett Date of Service: 05/08/2019 10:45 AM Medical Record Number: [id number] Patient Account Number: [id number] Date of Birth/Sex: 1960-10-04 (59 y.o. F) Treating RN: Montey Hora Primary Care Shaina Gullatt: Pernell Dupre Other Clinician: Referring Delos Klich: Pernell Dupre Treating Elfie Costanza/Extender: Melburn Hake, HOYT Weeks in Treatment: 1 Active Problems Location of Pain Severity and Description of Pain Patient Has Paino Yes Site Locations Rate the pain. Current Pain Level: 6 Pain Management and Medication Current Pain Management: Electronic Signature(s) Signed: 05/08/2019 1:29:53 PM By: Lorine Bears RCP, RRT, CHT Signed: 05/08/2019 4:13:47 PM By: Montey Hora Entered By: Lorine Bears on 05/08/2019 10:35:13 Audrey Garrett ([id number]) -------------------------------------------------------------------------------- Patient/Caregiver Education  Details Patient Name: Audrey Garrett Date of Service: 05/08/2019 10:45 AM Medical Record Number: [id number] Patient Account Number: [id number] Date of Birth/Gender: 10-Jan-1961 (59 y.o. F) Treating RN: Montey Hora Primary Care Physician: Pernell Dupre Other Clinician: Referring Physician: Pernell Dupre Treating Physician/Extender: Sharalyn Ink in Treatment: 1 Education Assessment Education Provided To: Patient and Caregiver Education Topics Provided Offloading: Handouts: Other: continue cam walker Methods: Explain/Verbal Responses: State content correctly Wound/Skin Impairment: Handouts: Other: wound care as ordered Methods: Demonstration, Explain/Verbal Responses: State content correctly Electronic Signature(s) Signed: 05/08/2019 4:13:47 PM By: Montey Hora Entered By: Montey Hora on 05/08/2019 10:59:44 Audrey Garrett ([id number]) -------------------------------------------------------------------------------- Wound Assessment Details Patient Name: Audrey Garrett Date of Service: 05/08/2019 10:45 AM Medical Record Number: [id number] Patient Account Number: [id number] Date of Birth/Sex: February 01, 1961 (59 y.o. F) Treating RN: Montey Hora Primary Care Arlynn Mcdermid: Pernell Dupre Other Clinician: Referring Maebell Lyvers: Pernell Dupre Treating Cherisse Carrell/Extender: Melburn Hake, HOYT Weeks in Treatment: 1 Wound Status Wound Number: 1 Primary Diabetic Wound/Ulcer of the Lower  Extremity Etiology: Wound Location: Left, Medial Foot Wound Healed - Epithelialized Wounding Event: Pressure Injury Status: Date Acquired: 02/27/2019 Comorbid Chronic Obstructive Pulmonary Disease (COPD), Weeks Of Treatment: 1 History: Cirrhosis , Type II Diabetes, Received Radiation Clustered Wound: No Photos Wound Measurements Length: (cm) 0 Width: (cm) 0 Depth: (cm) 0 Area: (cm) 0 Volume: (cm) 0 % Reduction in Area: 100% % Reduction in Volume: 100% Epithelialization: None Wound  Description Classification: Grade 1 Wound Margin: Flat and Intact Exudate Amount: Medium Exudate Type: Serous Exudate Color: amber Foul Odor After Cleansing: No Slough/Fibrino No Wound Bed Granulation Amount: Large (67-100%) Exposed Structure Granulation Quality: Pink Fascia Exposed: No Necrotic Amount: None Present (0%) Fat Layer (Subcutaneous Tissue) Exposed: No Tendon Exposed: No Muscle Exposed: No Joint Exposed: No Bone Exposed: No Limited to Skin Breakdown Electronic Signature(s) Signed: 05/08/2019 4:13:47 PM By: Montey Hora Entered By: Montey Hora on 05/08/2019 10:56:40 Audrey Garrett ([id number]) -------------------------------------------------------------------------------- Wound Assessment Details Patient Name: Audrey Garrett Date of Service: 05/08/2019 10:45 AM Medical Record Number: [id number] Patient Account Number: [id number] Date of Birth/Sex: 28-May-1960 (59 y.o. F) Treating RN: Montey Hora Primary Care Nevelyn Mellott: Pernell Dupre Other Clinician: Referring Gennavieve Huq: Pernell Dupre Treating Yaeko Fazekas/Extender: Melburn Hake, HOYT Weeks in Treatment: 1 Wound Status Wound Number: 2 Primary Diabetic Wound/Ulcer of the Lower Extremity Etiology: Wound Location: Left, Dorsal Foot Wound Open Wounding Event: Gradually Appeared Status: Date Acquired: 04/24/2019 Comorbid Chronic Obstructive Pulmonary Disease (COPD), Weeks Of Treatment: 1 History: Cirrhosis , Type II Diabetes, Received Radiation Clustered Wound: No Photos Wound Measurements Length: (cm) 0.9 Width: (cm) 3.2 Depth: (cm) 0.1 Area: (cm) 2.262 Volume: (cm) 0.226 % Reduction in Area: 52% % Reduction in Volume: 52% Epithelialization: None Wound Description Classification: Grade 1 Wound Margin: Flat and Intact Exudate Amount: Medium Exudate Type: Serous Exudate Color: amber Foul Odor After Cleansing: No Slough/Fibrino Yes Wound Bed Granulation Amount: Small (1-33%) Exposed  Structure Granulation Quality: Pink Fascia Exposed: No Necrotic Amount: Large (67-100%) Fat Layer (Subcutaneous Tissue) Exposed: Yes Necrotic Quality: Adherent Slough Tendon Exposed: No Muscle Exposed: No Joint Exposed: No Bone Exposed: No Electronic Signature(s) Signed: 05/08/2019 4:13:47 PM By: Montey Hora Entered By: Montey Hora on 05/08/2019 10:56:40 Audrey Garrett ([id number]) -------------------------------------------------------------------------------- Wound Assessment Details Patient Name: Audrey Garrett Date of Service: 05/08/2019 10:45 AM Medical Record Number: [id number] Patient Account Number: [id number] Date of Birth/Sex: [birth year] (59 y.o. F) Treating RN: Army Melia Primary Care Muhammad Vacca: Pernell Dupre Other Clinician: Referring Reza Crymes: Pernell Dupre Treating Janeka Libman/Extender: Melburn Hake, HOYT Weeks in Treatment: 1 Wound Status Wound Number: 3 Primary Diabetic Wound/Ulcer of the Lower Extremity Etiology: Wound Location: Left Toe Great Wound Open Wounding Event: Gradually Appeared Status: Date Acquired: 04/26/2019 Comorbid Chronic Obstructive Pulmonary Disease (COPD), Weeks Of Treatment: 1 History: Cirrhosis , Type II Diabetes, Received Radiation Clustered Wound: No Photos Wound Measurements Length: (cm) 0.2 Width: (cm) 0.2 Depth: (cm) 0.1 Area: (cm) 0.031 Volume: (cm) 0.003 % Reduction in Area: 56.3% % Reduction in Volume: 57.1% Epithelialization: None Wound Description Classification: Grade 1 Wound Margin: Flat and Intact Exudate Amount: Medium Exudate Type: Serous Exudate Color: amber Foul Odor After Cleansing: No Slough/Fibrino No Wound Bed Granulation Amount: Large (67-100%) Exposed Structure Granulation Quality: Pink Fascia Exposed: No Necrotic Amount: None Present (0%) Fat Layer (Subcutaneous Tissue) Exposed: Yes Tendon Exposed: No Muscle Exposed: No Joint Exposed: No Bone Exposed: No Electronic Signature(s) Signed:  05/09/2019 11:18:27 AM By: Army Melia Entered By: Army Melia on 05/08/2019 10:43:06 Audrey Garrett ([id number]) -------------------------------------------------------------------------------- Vitals Details Patient Name:  Audrey Garrett, Audrey B. Date of Service: 05/08/2019 10:45 AM Medical Record Number: [id number] Patient Account Number: [id number] Date of Birth/Sex: 1960/09/03 (59 y.o. F) Treating RN: Montey Hora Primary Care Carnelius Hammitt: Pernell Dupre Other Clinician: Referring Esterlene Atiyeh: Pernell Dupre Treating Hurley Blevins/Extender: Melburn Hake, HOYT Weeks in Treatment: 1 Vital Signs Time Taken: 10:35 Temperature (F): 99.4 Height (in): 70 Pulse (bpm): 93 Weight (lbs): 180 Respiratory Rate (breaths/min): 16 Body Mass Index (BMI): 25.8 Blood Pressure (mmHg): 107/69 Reference Range: 80 - 120 mg / dl Electronic Signature(s) Signed: 05/08/2019 1:29:53 PM By: Lorine Bears RCP, RRT, CHT Entered By: Becky Sax, Amado Nash on 05/08/2019 10:39:22

## 2019-05-15 ENCOUNTER — Encounter: Payer: Medicare Other | Admitting: Physician Assistant

## 2019-05-16 ENCOUNTER — Inpatient Hospital Stay: Payer: Medicare Other

## 2019-05-21 ENCOUNTER — Telehealth: Payer: Self-pay | Admitting: *Deleted

## 2019-05-21 NOTE — Telephone Encounter (Signed)
Per pt request to cancel 05/23/19 appt. Pt stated her PCP had already drawn her labs and everything looked good.  She stated that she would called back at a later date if needed.

## 2019-05-22 ENCOUNTER — Encounter: Payer: Medicare Other | Admitting: Physician Assistant

## 2019-05-23 ENCOUNTER — Inpatient Hospital Stay: Payer: Medicare Other

## 2019-06-06 ENCOUNTER — Encounter: Payer: Self-pay | Admitting: Pain Medicine

## 2019-06-07 ENCOUNTER — Encounter: Payer: Self-pay | Admitting: Oncology

## 2019-06-10 NOTE — Progress Notes (Signed)
Patient: Audrey Garrett  Service Category: E/M  Provider: Gaspar Cola, MD  DOB: 1960/05/19  DOS: 06/11/2019  Location: Office  MRN: [id number]  Setting: Ambulatory outpatient  Referring Provider: Jodi Marble, MD  Type: Established Patient  Specialty: Interventional Pain Management  PCP: Jodi Marble, MD  Location: Remote location  Delivery: TeleHealth     Virtual Encounter - Pain Management PROVIDER NOTE: Information contained herein reflects review and annotations entered in association with encounter. Interpretation of such information and data should be left to medically-trained personnel. Information provided to patient can be located elsewhere in the medical record under "Patient Instructions". Document created using STT-dictation technology, any transcriptional errors that may result from process are unintentional.    Contact & Pharmacy Preferred: [phone] Home: [phone] (home) Mobile: [phone] (mobile) E-mail: [email or web address]  [name] [address]Phone: [phone]: [phone]  Pre-screening  Audrey Garrett offered "in-person" vs "virtual" encounter. She indicated preferring virtual for this encounter.   Reason COVID-19*  Social distancing based on CDC and AMA recommendations.   I contacted Audrey Garrett 06/11/2019 via telephone.      I clearly identified myself as FGaspar Cola MD. I verified that I was speaking with the correct person using two identifiers (Name: Audrey Garrett and date of birth: [birth year].  Consent I sought verbal advanced consent from Audrey Cottafor virtual visit interactions. I informed Ms. FSzczerbaof possible security and privacy concerns, risks, and limitations associated with providing "not-in-person" medical evaluation and management services. I also informed Ms. FDufraneof the availability of "in-person" appointments. Finally, I  informed her that there would be a charge for the virtual visit and that she could be  personally, fully or partially, financially responsible for it. Ms. FDewberryexpressed understanding and agreed to proceed.   Historic Elements   Ms. FGLADIE GRAVETTEis a 59y.o. year old, female patient evaluated today after her last contact with our practice on 03/07/2019. Audrey Garrett has a past medical history of Alcohol abuse, Alcoholic cirrhosis ([place], Anxiety, Chronic hepatitis C ([place], Chronic LBP (12/25/2014), Chronic pain syndrome, DDD (degenerative disc disease), lumbar, Diverticulosis, Emphysema lung ([place], Fibromyalgia, Full dentures, GERD (gastroesophageal reflux disease), Hiatal hernia, History of GI bleed, Hypertension, Iron deficiency anemia due to chronic blood loss, Lumbar facet joint syndrome, Lumbar spondylosis, Lung cancer ([place] (11/25/2016), MDD (major depressive disorder), Nephrolithiasis, OA (osteoarthritis of spine), Polyarthritis, Polyneuropathy, Restless leg syndrome, Right renal mass, RLS (restless legs syndrome), SOB (shortness of breath), Type 2 diabetes mellitus ([place], Urinary frequency, and Vitamin D deficiency disease. She also  has a past surgical history that includes Esophagogastroduodenoscopy (N/A, 04/14/2015); Esophagogastroduodenoscopy (egd) with propofol (N/A, 09/16/2015); Colonoscopy with propofol (N/A, 09/25/2015); Umbilical hernia repair (N/A, 11/06/2015); Esophagogastroduodenoscopy (egd) with propofol (N/A, 01/22/2016); Colonoscopy with propofol (N/A, 04/01/2016); Esophagogastroduodenoscopy (egd) with propofol (N/A, 04/01/2016); Electormagnetic navigation bronchoscopy (N/A, 11/25/2016); IR Radiologist Eval & Mgmt (04/13/2017); IR Radiologist Eval & Mgmt (11/15/2017); Spinal fusion (12/19/2011); Endometrial ablation (2007); Tubal ligation (Bilateral, yrs ago); Tonsillectomy (age 59; Radiology with anesthesia (Right, 12/14/2017); and Foot arthrodesis (Right, 04/06/2018). Ms. FIsabellhas a current  medication list which includes the following prescription(s): cyclobenzaprine, escitalopram, furosemide, metformin, multivitamin with minerals, oxycodone hcl, [START ON 07/11/2019] oxycodone hcl, [START ON 08/10/2019] oxycodone hcl, ozempic (1 mg/dose), pantoprazole, pregabalin, spironolactone, linzess, rosuvastatin, and tresiba flextouch. She  reports that she has been smoking cigarettes. She has a 22.50 pack-year smoking  history. She has never used smokeless tobacco. She reports previous alcohol use. She reports that she does not use drugs. Audrey Garrett has No Known Allergies.   HPI  Today, she is being contacted for medication management.  Pharmacotherapy Assessment  Analgesic: Oxycodone IR 20 mg, 1 tab PO q 6 hrs(80 mg/day of oxycodone) MME/day:120 mg/day.   Monitoring: [place] PMP: PDMP reviewed during this encounter.       Pharmacotherapy: No side-effects or adverse reactions reported. Compliance: No problems identified. Effectiveness: Clinically acceptable. Plan: Refer to "POC".  UDS:  Summary  Date Value Ref Range Status  12/01/2017 FINAL  Final    Comment:    ==================================================================== TOXASSURE SELECT 13 (MW) ==================================================================== Test                             Result       Flag       Units Drug Present and Declared for Prescription Verification   Oxycodone                      1004         EXPECTED   ng/mg creat   Oxymorphone                    3423         EXPECTED   ng/mg creat   Noroxycodone                   6665         EXPECTED   ng/mg creat   Noroxymorphone                 3850         EXPECTED   ng/mg creat    Sources of oxycodone are scheduled prescription medications.    Oxymorphone, noroxycodone, and noroxymorphone are expected    metabolites of oxycodone. Oxymorphone is also available as a    scheduled prescription  medication. ==================================================================== Test                      Result    Flag   Units      Ref Range   Creatinine              26               mg/dL      >=20 ==================================================================== Declared Medications:  The flagging and interpretation on this report are based on the  following declared medications.  Unexpected results may arise from  inaccuracies in the declared medications.  **Note: The testing scope of this panel includes these medications:  Oxycodone  **Note: The testing scope of this panel does not include following  reported medications:  Cyclobenzaprine  Escitalopram (Lexapro)  Furosemide (Lasix)  Lactulose  Metformin  Multivitamin  Pantoprazole (Protonix)  Pregabalin (Lyrica)  Spironolactone (Aldactone)  Vitamin B1 ==================================================================== For clinical consultation, please call [phone]. ====================================================================    Laboratory Chemistry Profile   Renal Lab Results  Component Value Date   BUN 9 01/15/2019   CREATININE 0.71 01/15/2019   GFRAA >60 01/15/2019   GFRNONAA >60 01/15/2019    Hepatic Lab Results  Component Value Date   AST 35 01/15/2019   ALT 25 01/15/2019   ALBUMIN 4.3 01/15/2019   ALKPHOS 118 01/15/2019   LIPASE 32 11/06/2015   AMMONIA 12 09/14/2016    Electrolytes  Lab Results  Component Value Date   NA 128 (L) 01/15/2019   K 5.0 01/15/2019   CL 94 (L) 01/15/2019   CALCIUM 9.7 01/15/2019   MG 1.9 01/22/2016   PHOS 3.6 11/23/2013    Bone No results found for: VD25OH, VD125OH2TOT, GY1856DJ4, HF0263ZC5, 25OHVITD1, 25OHVITD2, 25OHVITD3, TESTOFREE, TESTOSTERONE  Inflammation (CRP: Acute Phase) (ESR: Chronic Phase) Lab Results  Component Value Date   CRP <0.5 04/03/2015   ESRSEDRATE 27 04/03/2015   LATICACIDVEN 2.7 ([place]) 04/23/2017      Note: Above Lab results  reviewed.  Imaging  US Abdomen Complete CLINICAL DATA:  Hepatic cirrhosis  EXAM: ABDOMEN ULTRASOUND COMPLETE  COMPARISON:  CT abdomen and pelvis October 04, 2018.  FINDINGS: Gallbladder: Within the gallbladder, there are echogenic foci which move and shadow consistent with cholelithiasis. Largest gallstone measures 8 mm in length. Sludge is also noted in the gallbladder. There is no gallbladder wall thickening or pericholecystic fluid. No sonographic Murphy sign noted by sonographer.  Common bile duct: Diameter: 3 mm. There is no intrahepatic, common hepatic, or common bile duct dilatation.  Liver: No focal lesion identified. Liver echogenicity is increased diffusely. The liver has a subtly nodular contour. Portal vein is patent on color Doppler imaging with normal direction of blood flow towards the liver.  IVC: No abnormality visualized.  Pancreas: No pancreatic mass or inflammatory focus.  Spleen: Size and appearance within normal limits.  Right Kidney: Length: 11.2 cm. Echogenicity within normal limits. No mass or hydronephrosis visualized.  Left Kidney: Length: 11.3 cm. Echogenicity within normal limits. No mass or hydronephrosis visualized.  Abdominal aorta: No aneurysm visualized.  Other findings: No demonstrable ascites.  IMPRESSION: 1. Cholelithiasis and sludge in gallbladder. No gallbladder wall thickening or pericholecystic fluid.  2. Liver contour is subtly nodular. Liver echogenicity is increased diffusely. These are findings indicative of hepatic cirrhosis with suspected underlying hepatic steatosis. While no focal liver lesions are evident on this study, it must be cautioned that the sensitivity of ultrasound for detection of focal liver lesions is diminished in this circumstance.  3.  Study otherwise unremarkable.  Electronically Signed   By: Lowella Grip III M.D.   On: 04/09/2019 11:25  Assessment  The primary encounter diagnosis was  Cancer-related pain. Diagnoses of Chronic pain syndrome, Failed back surgical syndrome, Charcot's joint, right ankle and foot, Charcot's joint of left foot, Chronic low back pain (Primary Area of Pain) (Bilateral) (L>R), Chronic lower extremity pain (Secondary area of Pain) (Left), and Polyneuropathy were also pertinent to this visit.  Plan of Care  Problem-specific:  No problem-specific Assessment & Plan notes found for this encounter.  Ms. AMARRA SAWYER has a current medication list which includes the following long-term medication(s): oxycodone hcl, [START ON 07/11/2019] oxycodone hcl, [START ON 08/10/2019] oxycodone hcl, spironolactone, linzess, and rosuvastatin.  Pharmacotherapy (Medications Ordered): Meds ordered this encounter  Medications  . Oxycodone HCl 20 MG TABS    Sig: Take 1 tablet (20 mg total) by mouth every 6 (six) hours. Must last 30 days    Dispense:  120 tablet    Refill:  0    Chronic Pain: STOP Act (Not applicable) Fill 1 day early if closed on refill date. Do not fill until: 06/11/2019. To last until: 07/11/2019. Avoid benzodiazepines within 8 hours of opioids  . Oxycodone HCl 20 MG TABS    Sig: Take 1 tablet (20 mg total) by mouth every 6 (six) hours. Must last 30 days    Dispense:  120  tablet    Refill:  0    Chronic Pain: STOP Act (Not applicable) Fill 1 day early if closed on refill date. Do not fill until: 07/11/2019. To last until: 08/10/2019. Avoid benzodiazepines within 8 hours of opioids  . Oxycodone HCl 20 MG TABS    Sig: Take 1 tablet (20 mg total) by mouth every 6 (six) hours. Must last 30 days    Dispense:  120 tablet    Refill:  0    Chronic Pain: STOP Act (Not applicable) Fill 1 day early if closed on refill date. Do not fill until: 08/10/2019. To last until: 09/09/2019. Avoid benzodiazepines within 8 hours of opioids   Orders:  No orders of the defined types were placed in this encounter.  Follow-up plan:   Return in about 12 weeks (around 09/03/2019) for  (VV), (MM).      Interventional management options:  Considering:   NOTE: Hx of Thrombocytopenia  Diagnostic bilateral lumbar facetblock  Possible bilateral lumbar facet RFA Diagnostic left caudal ESI + diagnostic epidurogram Possible Racz procedure Diagnostic bilateral SIjoint injection  Possible bilateral SI joint RFA Diagnostic left CESI  Diagnostic bilateral cervical facetblock  Possible bilateral cervical facet RFA   Palliative PRN treatment(s):   Diagnostic bilateral lumbar facet block Diagnostic bilateral IA hipjoint injection Diagnostic bilateral SIjoint block      Recent Visits No visits were found meeting these conditions.  Showing recent visits within past 90 days and meeting all other requirements   Today's Visits Date Type Provider Dept  06/11/19 Telemedicine [name], MD [facility]  Showing today's visits and meeting all other requirements   Future Appointments No visits were found meeting these conditions.  Showing future appointments within next 90 days and meeting all other requirements   I discussed the assessment and treatment plan with the patient. The patient was provided an opportunity to ask questions and all were answered. The patient agreed with the plan and demonstrated an understanding of the instructions.  Patient advised to call back or seek an in-person evaluation if the symptoms or condition worsens.  Duration of encounter: 12 minutes.  Note by: Gaspar Cola, MD Date: 06/11/2019; Time: 1:44 PM

## 2019-06-11 ENCOUNTER — Ambulatory Visit: Payer: Medicare Other | Attending: Pain Medicine | Admitting: Pain Medicine

## 2019-06-11 ENCOUNTER — Other Ambulatory Visit: Payer: Self-pay

## 2019-06-11 DIAGNOSIS — M961 Postlaminectomy syndrome, not elsewhere classified: Secondary | ICD-10-CM

## 2019-06-11 DIAGNOSIS — G8929 Other chronic pain: Secondary | ICD-10-CM

## 2019-06-11 DIAGNOSIS — M14671 Charcot's joint, right ankle and foot: Secondary | ICD-10-CM

## 2019-06-11 DIAGNOSIS — M79605 Pain in left leg: Secondary | ICD-10-CM

## 2019-06-11 DIAGNOSIS — M5442 Lumbago with sciatica, left side: Secondary | ICD-10-CM

## 2019-06-11 DIAGNOSIS — G894 Chronic pain syndrome: Secondary | ICD-10-CM

## 2019-06-11 DIAGNOSIS — M14672 Charcot's joint, left ankle and foot: Secondary | ICD-10-CM

## 2019-06-11 DIAGNOSIS — G893 Neoplasm related pain (acute) (chronic): Secondary | ICD-10-CM

## 2019-06-11 DIAGNOSIS — G629 Polyneuropathy, unspecified: Secondary | ICD-10-CM

## 2019-06-11 MED ORDER — OXYCODONE HCL 20 MG PO TABS: 1.0000 | ORAL_TABLET | Freq: Four times a day (QID) | ORAL | 0 refills | Status: DC

## 2019-07-03 ENCOUNTER — Other Ambulatory Visit: Payer: Self-pay | Admitting: Gastroenterology

## 2019-07-03 DIAGNOSIS — K7031 Alcoholic cirrhosis of liver with ascites: Secondary | ICD-10-CM

## 2019-07-04 ENCOUNTER — Other Ambulatory Visit: Payer: Self-pay | Admitting: Gastroenterology

## 2019-07-04 ENCOUNTER — Other Ambulatory Visit: Payer: Self-pay

## 2019-07-04 ENCOUNTER — Ambulatory Visit
Admission: RE | Admit: 2019-07-04 | Discharge: 2019-07-04 | Disposition: A | Discharge status: Home / Self Care | Payer: Medicare Other | Source: Ambulatory Visit | Attending: Gastroenterology | Admitting: Gastroenterology

## 2019-07-04 ENCOUNTER — Other Ambulatory Visit
Admission: RE | Admit: 2019-07-04 | Discharge: 2019-07-04 | Disposition: A | Discharge status: Home / Self Care | Payer: Medicare Other | Source: Ambulatory Visit | Attending: Gastroenterology | Admitting: Gastroenterology

## 2019-07-04 DIAGNOSIS — K7031 Alcoholic cirrhosis of liver with ascites: Secondary | ICD-10-CM

## 2019-07-04 DIAGNOSIS — R140 Abdominal distension (gaseous): Secondary | ICD-10-CM

## 2019-07-04 LAB — BASIC METABOLIC PANEL
Anion gap: 12 (ref 5–15)
BUN: 8 mg/dL (ref 6–20)
CO2: 27 mmol/L (ref 22–32)
Calcium: 8.8 mg/dL — ABNORMAL LOW (ref 8.9–10.3)
Chloride: 93 mmol/L — ABNORMAL LOW (ref 98–111)
Creatinine, Ser: 0.56 mg/dL (ref 0.44–1.00)
GFR calc Af Amer: >60 mL/min (ref >60)
GFR calc non Af Amer: >60 mL/min (ref >60)
Glucose, Bld: 166 mg/dL — ABNORMAL HIGH (ref 70–99)
Potassium: 3.7 mmol/L (ref 3.5–5.1)
Sodium: 132 mmol/L — ABNORMAL LOW (ref 135–145)

## 2019-07-04 LAB — CBC WITH DIFFERENTIAL/PLATELET
Abs Immature Granulocytes: 0.03 10*3/uL (ref 0.00–0.07)
Basophils Absolute: 0 10*3/uL (ref 0.0–0.1)
Basophils Relative: 0 %
Eosinophils Absolute: 0.2 10*3/uL (ref 0.0–0.5)
Eosinophils Relative: 3 %
HCT: 38 % (ref 36.0–46.0)
Hemoglobin: 11.9 g/dL — ABNORMAL LOW (ref 12.0–15.0)
Immature Granulocytes: 0 %
Lymphocytes Relative: 18 %
Lymphs Abs: 1.2 10*3/uL (ref 0.7–4.0)
MCH: 30.7 pg (ref 26.0–34.0)
MCHC: 31.3 g/dL (ref 30.0–36.0)
MCV: 98.2 fL (ref 80.0–100.0)
Monocytes Absolute: 0.8 10*3/uL (ref 0.1–1.0)
Monocytes Relative: 11 %
Neutro Abs: 4.6 10*3/uL (ref 1.7–7.7)
Neutrophils Relative %: 68 %
Platelets: 131 10*3/uL — ABNORMAL LOW (ref 150–400)
RBC: 3.87 MIL/uL (ref 3.87–5.11)
RDW: 17.6 % — ABNORMAL HIGH (ref 11.5–15.5)
WBC: 6.9 10*3/uL (ref 4.0–10.5)
nRBC: 0 % (ref 0.0–0.2)

## 2019-07-04 LAB — HEPATIC FUNCTION PANEL
ALT: 20 U/L (ref 0–44)
AST: 39 U/L (ref 15–41)
Albumin: 4.1 g/dL (ref 3.5–5.0)
Alkaline Phosphatase: 93 U/L (ref 38–126)
Bilirubin, Direct: <0.1 mg/dL (ref 0.0–0.2)
Total Bilirubin: 0.7 mg/dL (ref 0.3–1.2)
Total Protein: 8.1 g/dL (ref 6.5–8.1)

## 2019-07-04 LAB — PROTIME-INR
INR: 1.1 (ref 0.8–1.2)
Prothrombin Time: 14.5 seconds (ref 11.4–15.2)

## 2019-07-05 LAB — AFP TUMOR MARKER: AFP, Serum, Tumor Marker: 5.5 ng/mL (ref 0.0–8.3)

## 2019-07-20 ENCOUNTER — Inpatient Hospital Stay: Payer: Medicare Other | Attending: Oncology

## 2019-07-20 ENCOUNTER — Ambulatory Visit
Admission: RE | Admit: 2019-07-20 | Discharge: 2019-07-20 | Disposition: A | Discharge status: Home / Self Care | Payer: Medicare Other | Source: Ambulatory Visit | Attending: Oncology | Admitting: Oncology

## 2019-07-20 ENCOUNTER — Other Ambulatory Visit: Payer: Self-pay

## 2019-07-20 DIAGNOSIS — D509 Iron deficiency anemia, unspecified: Secondary | ICD-10-CM | POA: Insufficient documentation

## 2019-07-20 DIAGNOSIS — R935 Abnormal findings on diagnostic imaging of other abdominal regions, including retroperitoneum: Secondary | ICD-10-CM | POA: Insufficient documentation

## 2019-07-20 DIAGNOSIS — C649 Malignant neoplasm of unspecified kidney, except renal pelvis: Secondary | ICD-10-CM | POA: Insufficient documentation

## 2019-07-20 DIAGNOSIS — F17210 Nicotine dependence, cigarettes, uncomplicated: Secondary | ICD-10-CM | POA: Insufficient documentation

## 2019-07-20 DIAGNOSIS — C3411 Malignant neoplasm of upper lobe, right bronchus or lung: Secondary | ICD-10-CM | POA: Insufficient documentation

## 2019-07-20 DIAGNOSIS — R140 Abdominal distension (gaseous): Secondary | ICD-10-CM | POA: Insufficient documentation

## 2019-07-20 DIAGNOSIS — Z85528 Personal history of other malignant neoplasm of kidney: Secondary | ICD-10-CM | POA: Insufficient documentation

## 2019-07-20 DIAGNOSIS — N9489 Other specified conditions associated with female genital organs and menstrual cycle: Secondary | ICD-10-CM | POA: Insufficient documentation

## 2019-07-20 DIAGNOSIS — K7031 Alcoholic cirrhosis of liver with ascites: Secondary | ICD-10-CM

## 2019-07-20 DIAGNOSIS — D398 Neoplasm of uncertain behavior of other specified female genital organs: Secondary | ICD-10-CM | POA: Insufficient documentation

## 2019-07-20 LAB — CBC WITH DIFFERENTIAL/PLATELET
Abs Immature Granulocytes: 0.04 10*3/uL (ref 0.00–0.07)
Basophils Absolute: 0 10*3/uL (ref 0.0–0.1)
Basophils Relative: 1 %
Eosinophils Absolute: 0.2 10*3/uL (ref 0.0–0.5)
Eosinophils Relative: 4 %
HCT: 36.5 % (ref 36.0–46.0)
Hemoglobin: 11.6 g/dL — ABNORMAL LOW (ref 12.0–15.0)
Immature Granulocytes: 1 %
Lymphocytes Relative: 20 %
Lymphs Abs: 1.3 10*3/uL (ref 0.7–4.0)
MCH: 30.6 pg (ref 26.0–34.0)
MCHC: 31.8 g/dL (ref 30.0–36.0)
MCV: 96.3 fL (ref 80.0–100.0)
Monocytes Absolute: 0.7 10*3/uL (ref 0.1–1.0)
Monocytes Relative: 11 %
Neutro Abs: 3.9 10*3/uL (ref 1.7–7.7)
Neutrophils Relative %: 63 %
Platelets: 136 10*3/uL — ABNORMAL LOW (ref 150–400)
RBC: 3.79 MIL/uL — ABNORMAL LOW (ref 3.87–5.11)
RDW: 17.7 % — ABNORMAL HIGH (ref 11.5–15.5)
WBC: 6.2 10*3/uL (ref 4.0–10.5)
nRBC: 0 % (ref 0.0–0.2)

## 2019-07-20 LAB — IRON AND TIBC
Iron: 45 ug/dL (ref 28–170)
Saturation Ratios: 9 % — ABNORMAL LOW (ref 10.4–31.8)
TIBC: 493 ug/dL — ABNORMAL HIGH (ref 250–450)
UIBC: 448 ug/dL

## 2019-07-20 LAB — FERRITIN: Ferritin: 15 ng/mL (ref 11–307)

## 2019-07-20 MED ORDER — IOHEXOL 300 MG/ML  SOLN
100.0000 mL | Freq: Once | INTRAMUSCULAR | Status: AC | PRN
  Administered 2019-07-20: 100 mL via INTRAVENOUS

## 2019-07-24 ENCOUNTER — Other Ambulatory Visit: Payer: Self-pay | Admitting: Oncology

## 2019-07-24 ENCOUNTER — Inpatient Hospital Stay (HOSPITAL_BASED_OUTPATIENT_CLINIC_OR_DEPARTMENT_OTHER): Payer: Medicare Other | Admitting: Oncology

## 2019-07-24 ENCOUNTER — Encounter: Payer: Self-pay | Admitting: Oncology

## 2019-07-24 ENCOUNTER — Other Ambulatory Visit: Payer: Self-pay | Admitting: *Deleted

## 2019-07-24 DIAGNOSIS — Z85528 Personal history of other malignant neoplasm of kidney: Secondary | ICD-10-CM

## 2019-07-24 DIAGNOSIS — C3411 Malignant neoplasm of upper lobe, right bronchus or lung: Secondary | ICD-10-CM

## 2019-07-24 DIAGNOSIS — N949 Unspecified condition associated with female genital organs and menstrual cycle: Secondary | ICD-10-CM

## 2019-07-24 DIAGNOSIS — D509 Iron deficiency anemia, unspecified: Secondary | ICD-10-CM

## 2019-07-24 DIAGNOSIS — C649 Malignant neoplasm of unspecified kidney, except renal pelvis: Secondary | ICD-10-CM

## 2019-07-24 DIAGNOSIS — M25551 Pain in right hip: Secondary | ICD-10-CM

## 2019-07-24 NOTE — Progress Notes (Signed)
Patient called and screened for oncologist virtual visit today. Patient compains pain 10/10 and numbness/tingling in both feet. She states "she can not walk and will need  surgery for her right foot collapse".

## 2019-07-26 ENCOUNTER — Telehealth: Payer: Self-pay | Admitting: *Deleted

## 2019-07-26 ENCOUNTER — Other Ambulatory Visit: Payer: Medicare Other

## 2019-07-26 NOTE — Telephone Encounter (Signed)
Per Dr. Janese Banks and discussion at conference today, there has been minimal growth to RUL lung nodule and will require follow up CT scan in 6 months. Pt has been made aware of recommendations. Instructed to keep appts as scheduled for follow up CT in 6 months. Appt with Dr. Baruch Gouty on 5/7 has been cancelled at this time. Will reschedule after next CT scan if needed. Pt verbalized understanding. Nothing further needed at this time.

## 2019-07-26 NOTE — Progress Notes (Signed)
Tumor Board Documentation  Audrey Garrett was presented by Dr Janese Banks at our Tumor Board on 07/26/2019, which included representatives from medical oncology, radiation oncology, navigation, pathology, radiology, surgical oncology, internal medicine, genetics, pulmonology, palliative care, research.  Audrey Garrett currently presents as a current patient, for discussion with history of the following treatments: active survellience, neoadjuvant chemotherapy.  Additionally, we reviewed previous medical and familial history, history of present illness, and recent lab results along with all available histopathologic and imaging studies. The tumor board considered available treatment options and made the following recommendations: Active surveillance(Repeat CT 3-6 months, Refer to XRT if growing) Referred to GYN for Ovarian finding  The following procedures/referrals were also placed: No orders of the defined types were placed in this encounter.   Clinical Trial Status: not discussed   Staging used: Not Applicable  National site-specific guidelines   were discussed with respect to the case.  Tumor board is a meeting of clinicians from various specialty areas who evaluate and discuss patients for whom a multidisciplinary approach is being considered. Final determinations in the plan of care are those of the provider(s). The responsibility for follow up of recommendations given during tumor board is that of the provider.   Today's extended care, comprehensive team conference, Audrey Garrett was not present for the discussion and was not examined.   Multidisciplinary Tumor Board is a multidisciplinary case peer review process.  Decisions discussed in the Multidisciplinary Tumor Board reflect the opinions of the specialists present at the conference without having examined the patient.  Ultimately, treatment and diagnostic decisions rest with the primary provider(s) and the patient.

## 2019-07-27 ENCOUNTER — Inpatient Hospital Stay: Payer: Medicare Other

## 2019-07-27 ENCOUNTER — Other Ambulatory Visit: Payer: Self-pay | Admitting: *Deleted

## 2019-07-27 ENCOUNTER — Other Ambulatory Visit: Payer: Self-pay

## 2019-07-27 VITALS — BP 109/69 | HR 84 | Temp 97.0°F | Resp 20

## 2019-07-27 DIAGNOSIS — D509 Iron deficiency anemia, unspecified: Secondary | ICD-10-CM | POA: Diagnosis not present

## 2019-07-27 DIAGNOSIS — D500 Iron deficiency anemia secondary to blood loss (chronic): Secondary | ICD-10-CM

## 2019-07-27 DIAGNOSIS — N949 Unspecified condition associated with female genital organs and menstrual cycle: Secondary | ICD-10-CM

## 2019-07-27 MED ORDER — SODIUM CHLORIDE 0.9 % IV SOLN
Freq: Once | INTRAVENOUS | Status: AC
  Filled 2019-07-27: qty 250

## 2019-07-27 MED ORDER — SODIUM CHLORIDE 0.9 % IV SOLN
510.0000 mg | Freq: Once | INTRAVENOUS | Status: AC
  Administered 2019-07-27: 510 mg via INTRAVENOUS
  Filled 2019-07-27: qty 510

## 2019-07-27 NOTE — Progress Notes (Signed)
I connected with Audrey Garrett on 07/27/19 at 10:30 AM EDT by video enabled telemedicine visit and verified that I am speaking with the correct person using two identifiers.   I discussed the limitations, risks, security and privacy concerns of performing an evaluation and management service by telemedicine and the availability of in-person appointments. I also discussed with the patient that there may be a patient responsible charge related to this service. The patient expressed understanding and agreed to proceed.  Other persons participating in the visit and their role in the encounter:  Patients husband  [name] location:  home Provider's location:  Work  Diagnosis-1. Iron deficiency anemia  2. Renal cell carcinoma  3. Stage I lung cancer [id number]  4. Bilateral lung nodule- ? Low grade multifocal adenocarcinoma Chief Complaint: Discuss CT and lab test results and further management  History of present illness: Patient is a 59 year old female with a h/o liver cirrhosis and is currently receiving Harvoni for hepatitis C. GI evaluation is as follows:  Upper EGD on 04/01/16 showed multiple dispersed diminutive erosions were found in the gastric antrum. There were no stigmata of recent bleeding. One healing cratered gastric ulcer with no stigmata of bleeding was found in the prepyloric region of the stomach. The lesion was 2 mm in largest dimension. Colonoscopy on 04/01/16 showed multiple small-mouthed diverticula were found in the sigmoid colon and descending colon. A single small localized angioectasia with typical arborization was found in the proximal ascending colon. Coagulation for tissue destruction using argon plasma at 0.5 liters/minute and 20 watts was successful.  She has received significant blood transfusions recently since March 2017. Her last blood transfusion was on 03/17/16. She had 2 units of blood She received 4 doses of IV Venofer  In Jan 2018 she received blood  trasnsfusion followed by ferraheme.   During her visit in February 2018 her H&H was 10.3/ 31.6. IV iron was held off and she was supposed to get cbc checked in1 month. Patient missed that appointment and became progressively iron deficient again when her hb dropped to 7.9. She received 1 unit of blood transfusion  Patient continued to feel fatigued despite improvement in her anemia. Given her strong h/o smoking CT lung cancer screenign was obtained which showed: IMPRESSION: 11.6 mm spiculated right lung nodule just anterior to the major fissure with associated fissure all retraction. Lung-RADS Category 4A, suspicious. Follow up low-dose chest CT without contrast in 3 months (please use the following order, "CT CHEST LCS NODULE FOLLOW-UP W/O CM") is recommended. Alternatively, PET may be considered when there is a solid component 36mm or larger.  Other bilateral pulmonary nodules evident. Attention on follow-up.   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 She did not wish to pursue surgery  She sees DR. Erlene Quan for her renal mass. 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  She was told by orthopedics that she will not be able to walk given the deformities that she has and she is very tearful about it.  Reports chronic fatigue.  Denies other complaints   Review of Systems  Constitutional: Positive for malaise/fatigue. Negative for chills, fever and weight loss.  HENT: Negative for congestion, ear discharge and nosebleeds.   Eyes: Negative for blurred vision.  Respiratory: Negative for cough, hemoptysis,  sputum production, shortness of breath and wheezing.   Cardiovascular: Negative for chest pain, palpitations, orthopnea and  claudication.  Gastrointestinal: Negative for abdominal pain, blood in stool, constipation, diarrhea, heartburn, melena, nausea and vomiting.  Genitourinary: Negative for dysuria, flank pain, frequency, hematuria and urgency.  Musculoskeletal: Negative for back pain, joint pain and myalgias.  Skin: Negative for rash.  Neurological: Negative for dizziness, tingling, focal weakness, seizures, weakness and headaches.       Bilateral leg weakness and deformity with inability to walk  Endo/Heme/Allergies: Does not bruise/bleed easily.  Psychiatric/Behavioral: Negative for depression and suicidal ideas. The patient does not have insomnia.     No Known Allergies  Past Medical History:  Diagnosis Date  . Alcohol abuse   . Alcoholic cirrhosis ([place])    GI-- dr skulskie/  hx ascites 2017   . Anxiety   . Chronic hepatitis C ([place])    treated w/ harvoni(2018)  . Chronic LBP 12/25/2014   Overview:  Post extensive surgery   . Chronic pain syndrome    neck , upper back, post extensive spinal surgery  . DDD (degenerative disc disease), lumbar   . Diverticulosis   . Emphysema lung ([place])   . Fibromyalgia   . Full dentures    partials, upper and lower  . GERD (gastroesophageal reflux disease)    gastroparesis  . Hiatal hernia   . History of GI bleed    06/ 2017  ulcers and gastric erosions, transfused x4/  10/ 2017 unknown source/  01/ 2018  transfused x2 units and IV Iron   . Hypertension   . Iron deficiency anemia due to chronic blood loss    hemotologist-- dr Alvy Bimler  . Lumbar facet joint syndrome   . Lumbar spondylosis   . Lung cancer ([place]) 11/25/2016   right upper lobe nodule , Stage I,  treatment SBRT  . MDD (major depressive disorder)   . Nephrolithiasis    per CT 09-30-2017 right side nonobstructive  . OA (osteoarthritis of spine)   . Polyarthritis   . Polyneuropathy   . Restless leg syndrome   . Right renal mass   . RLS (restless legs syndrome)   . SOB (shortness of breath)    . Type 2 diabetes mellitus ([place])    followed by pcp  . Urinary frequency   . Vitamin D deficiency disease     Past Surgical History:  Procedure Laterality Date  . COLONOSCOPY WITH PROPOFOL N/A 09/25/2015   Procedure: COLONOSCOPY WITH PROPOFOL;  Surgeon: Lollie Sails, MD;  Location[facility];  Service: Endoscopy;  Laterality: N/A;  . COLONOSCOPY WITH PROPOFOL N/A 04/01/2016   Procedure: COLONOSCOPY WITH PROPOFOL;  Surgeon: Lollie Sails, MD;  Location: [facility] ENDOSCOPY;  Service: Endoscopy;  Laterality: N/A;  . ELECTROMAGNETIC NAVIGATION BROCHOSCOPY N/A 11/25/2016   Procedure: ELECTROMAGNETIC NAVIGATION BRONCHOSCOPY;  Surgeon: Flora Lipps, MD;  Location: [facility] ORS;  Service: Cardiopulmonary;  Laterality: N/A;  . ENDOMETRIAL ABLATION  2007  . ESOPHAGOGASTRODUODENOSCOPY N/A 04/14/2015   Procedure: ESOPHAGOGASTRODUODENOSCOPY (EGD);  Surgeon: Lollie Sails, MD;  Location: [facility] ENDOSCOPY;  Service: Endoscopy;  Laterality: N/A;  . ESOPHAGOGASTRODUODENOSCOPY (EGD) WITH PROPOFOL N/A 09/16/2015   Procedure: ESOPHAGOGASTRODUODENOSCOPY (EGD) WITH PROPOFOL;  Surgeon: Lollie Sails, MD;  Location: [facility] ENDOSCOPY;  Service: Endoscopy;  Laterality: N/A;  . ESOPHAGOGASTRODUODENOSCOPY (EGD) WITH PROPOFOL N/A 01/22/2016   Procedure: ESOPHAGOGASTRODUODENOSCOPY (EGD) WITH PROPOFOL;  Surgeon: [name], MD;  Location: [place];  Service: Endoscopy;  Laterality: N/A;  . ESOPHAGOGASTRODUODENOSCOPY (  EGD) WITH PROPOFOL N/A 04/01/2016   Procedure: ESOPHAGOGASTRODUODENOSCOPY (EGD) WITH PROPOFOL;  Surgeon: Lollie Sails, MD;  Location: [facility] ENDOSCOPY;  Service: Endoscopy;  Laterality: N/A;  . FOOT ARTHRODESIS Right 04/06/2018   Procedure: ARTHRODESIS - HALLUX/IPJOINT;  Surgeon: [name], DPM;  Location: [place];  Service: Podiatry;  Laterality: Right;  HAMMERLOCK IMPLANTS PARAGON 4-0 SCREW LMA LOCAL Diabetic - oral meds  . IR RADIOLOGIST EVAL & MGMT  04/13/2017  . IR  RADIOLOGIST EVAL & MGMT  11/15/2017  . RADIOLOGY WITH ANESTHESIA Right 12/14/2017   Procedure: CT MICROWAVE THERMAL ABLATION AND BIOPSY WITH ANESTHESIA;  Surgeon: Markus Daft, MD;  Location: [facility] ORS;  Service: Anesthesiology;  Laterality: Right;  . SPINAL FUSION  12/19/2011   T5 to sacrum (rod, screws)  . TONSILLECTOMY  age 69  . TUBAL LIGATION Bilateral yrs ago  . UMBILICAL HERNIA REPAIR N/A 11/06/2015   Procedure: HERNIA REPAIR UMBILICAL ADULT;  Surgeon: [name], MD;  Location: [facility] ORS;  Service: General;  Laterality: N/A;    Social History   Socioeconomic History  . Marital status: Married    Spouse name: Not on file  . Number of children: Not on file  . Years of education: Not on file  . Highest education level: Not on file  Occupational History  . Not on file  Tobacco Use  . Smoking status: Current Every Day Smoker    Packs/day: 0.50    Years: 45.00    Pack years: 22.50    Types: Cigarettes  . Smokeless tobacco: Never Used  . Tobacco comment: 12-05-2017 2ppd until 2017 down to 1/2 ppd  Substance and Sexual Activity  . Alcohol use: Not Currently    Alcohol/week: 0.0 standard drinks    Comment: did drink 12 pack beer per week (7 oz each)  . Drug use: No  . Sexual activity: Not on file  Other Topics Concern  . Not on file  Social History Narrative   Lives at home with husband   Social Determinants of Health   Financial Resource Strain:   . Difficulty of Paying Living Expenses:   Food Insecurity:   . Worried About Charity fundraiser in the Last Year:   . Arboriculturist in the Last Year:   Transportation Needs:   . Film/video editor (Medical):   Marland Kitchen Lack of Transportation (Non-Medical):   Physical Activity:   . Days of Exercise per Week:   . Minutes of Exercise per Session:   Stress:   . Feeling of Stress :   Social Connections:   . Frequency of Communication with Friends and Family:   . Frequency of Social Gatherings with Friends and Family:   .  Attends Religious Services:   . Active Member of Clubs or Organizations:   . Attends Archivist Meetings:   Marland Kitchen Marital Status:   Intimate Partner Violence:   . Fear of Current or Ex-Partner:   . Emotionally Abused:   Marland Kitchen Physically Abused:   . Sexually Abused:     Family History  Problem Relation Age of Onset  . Stroke Mother   . Heart disease Mother   . [name] Father   . Breast cancer Sister 35  . Kidney disease Neg Hx   . Bladder Cancer Neg Hx   . Kidney cancer Neg Hx   . Prostate cancer Neg Hx      Current Outpatient Medications:  .  cyclobenzaprine (FLEXERIL) 5 MG tablet, Take 1  tablet (5 mg total) by mouth 3 (three) times daily as needed for muscle spasms., Disp: 90 tablet, Rfl: 5 .  escitalopram (LEXAPRO) 10 MG tablet, Take 10 mg by mouth daily. , Disp: , Rfl:  .  furosemide (LASIX) 40 MG tablet, Take 40 mg by mouth 2 (two) times daily. , Disp: , Rfl:  .  LINZESS 145 MCG CAPS capsule, Take 145 mcg by mouth every morning., Disp: , Rfl:  .  metFORMIN (GLUCOPHAGE) 850 MG tablet, Take 850 mg by mouth 2 (two) times daily., Disp: , Rfl:  .  Multiple Vitamin (MULTIVITAMIN WITH MINERALS) TABS tablet, Take 1 tablet by mouth daily., Disp: , Rfl:  .  Oxycodone HCl 20 MG TABS, Take 1 tablet (20 mg total) by mouth every 6 (six) hours. Must last 30 days, Disp: 120 tablet, Rfl: 0 .  [START ON 08/10/2019] Oxycodone HCl 20 MG TABS, Take 1 tablet (20 mg total) by mouth every 6 (six) hours. Must last 30 days, Disp: 120 tablet, Rfl: 0 .  OZEMPIC 1 MG/DOSE SOPN, Inject 1 mg into the skin every Tuesday., Disp: , Rfl:  .  pantoprazole (PROTONIX) 40 MG tablet, Take 40 mg by mouth 2 (two) times daily. , Disp: , Rfl: 11 .  pregabalin (LYRICA) 200 MG capsule, Take 200 mg by mouth 2 (two) times daily., Disp: , Rfl:  .  rosuvastatin (CRESTOR) 5 MG tablet, Take 5 mg by mouth at bedtime., Disp: , Rfl:  .  spironolactone (ALDACTONE) 50 MG tablet, Take 100 mg by mouth daily. , Disp: , Rfl:  .   TRESIBA FLEXTOUCH 200 UNIT/ML FlexTouch Pen, SMARTSIG:10 Unit(s) SUB-Q Daily, Disp: , Rfl:  .  Oxycodone HCl 20 MG TABS, Take 1 tablet (20 mg total) by mouth every 6 (six) hours. Must last 30 days, Disp: 120 tablet, Rfl: 0  CT ABDOMEN PELVIS W WO CONTRAST  Result Date: 07/20/2019 CLINICAL DATA:  Renal cell carcinoma. EXAM: CT CHEST WITH CONTRAST CT ABDOMEN AND PELVIS WITH AND WITHOUT CONTRAST TECHNIQUE: Multidetector CT imaging of the chest was performed during intravenous contrast administration. Multidetector CT imaging of the abdomen and pelvis was performed following the standard protocol before and during bolus administration of intravenous contrast. CONTRAST:  160mL OMNIPAQUE IOHEXOL 300 MG/ML  SOLN COMPARISON:  CT chest 01/15/2019. Abdomen and pelvis CT 10/04/2018. FINDINGS: CT CHEST FINDINGS Cardiovascular: The heart size is normal. No substantial pericardial effusion. Atherosclerotic calcification is noted in the wall of the thoracic aorta. Mediastinum/Nodes: No mediastinal lymphadenopathy. There is no hilar lymphadenopathy. The esophagus has normal imaging features. There is no axillary lymphadenopathy. Lungs/Pleura: Centrilobular emphsyema noted. Mixed attenuation lesion posterior right upper lobe shows continued progression of a solid posterior nodular component now measuring 13 mm (69/17) compared to 6 mm previously. Architectural distortion with associated consolidative airspace opacity in the parahilar right lung is similar to 10/04/2018. Volume loss in the anterior right lower lobe along the major fissure is stable and presumably reflects post treatment changes. 9 mm subpleural nodule in the anterior left upper lobe (76/17) is similar to prior. 18 mm ground-glass opacity in the medial left upper lobe on 78/17 is similar. 7 mm posterior left upper lobe nodule on 84/17 is without substantial interval change. Multiple ground-glass opacities in the left lower lobe show no substantial change.  Musculoskeletal: Thoracolumbar fusion hardware again noted. No worrisome lytic or sclerotic osseous abnormality. CT ABDOMEN AND PELVIS FINDINGS Hepatobiliary: Nodular liver contour is compatible with cirrhosis. No suspicious focal abnormality within the liver parenchyma.  Tiny calcified gallstones evident. No intrahepatic or extrahepatic biliary dilation. Pancreas: No focal mass lesion. No dilatation of the main duct. No intraparenchymal cyst. No peripancreatic edema. Spleen: No splenomegaly. No focal mass lesion. Adrenals/Urinary Tract: No adrenal nodule or mass. Posterior ablation defect in the upper pole right kidney has become more well-defined in the interval measuring 2.0 x 2.5 cm today. No features to suggest local recurrence. Tiny hypoattenuating lesions in the left kidney are too small to characterize but similar to prior and likely benign. No evidence for hydroureter. The urinary bladder appears normal for the degree of distention. Stomach/Bowel: Stomach is unremarkable. No gastric wall thickening. No evidence of outlet obstruction. Duodenum is normally positioned as is the ligament of Treitz. No small bowel wall thickening. No small bowel dilatation. The terminal ileum is normal. The appendix is normal. No gross colonic mass. No colonic wall thickening. Diverticular changes are noted in the left colon without evidence of diverticulitis. Vascular/Lymphatic: There is abdominal aortic atherosclerosis without aneurysm. There is no gastrohepatic or hepatoduodenal ligament lymphadenopathy. No retroperitoneal or mesenteric lymphadenopathy. No pelvic sidewall lymphadenopathy. Reproductive: The uterus is unremarkable. Cystic mass in the right adnexal space has progressed in the interval, measuring 6.5 x 5.1 cm today compared to 3.6 x 2.9 cm previously. No enhancing mural nodule evident. Other: No intraperitoneal free fluid. Musculoskeletal: Lumbosacral fusion hardware evident. No worrisome lytic or sclerotic  osseous abnormality. IMPRESSION: 1. Continued progression of the solid nodular component of a mixed attenuation posterior right upper lobe pulmonary lesion, now measuring 13 mm compared to 6 mm previously. PET-CT may be warranted to further evaluate. 2. Similar appearance of post treatment changes in the parahilar right lung. 3. Stable appearance of bilateral ground-glass nodules. Continued attention on follow-up recommended. 4. Interval progression of the cystic mass in the right adnexal space, now measuring up to 6.5 cm compared to 3.6 cm previously. 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 Pelvic ultrasound recommended to further evaluate. This recommendation follows ACR consensus guidelines: White Paper of the ACR Incidental Findings Committee II on Adnexal Findings. J Am Coll Radiol 2013:10:675-681. 5. Stable appearance post ablation defect upper pole right kidney. No evidence for local recurrence or metastatic disease. 6. Cirrhosis. 7. Cholelithiasis. 8. Left colonic diverticulosis without diverticulitis. 9. Aortic Atherosclerosis (ICD10-I70.0) and Emphysema (ICD10-J43.9). Electronically Signed   By: Misty Stanley M.D.   On: 07/20/2019 14:44   CT Chest W Contrast  Result Date: 07/20/2019 CLINICAL DATA:  Renal cell carcinoma. EXAM: CT CHEST WITH CONTRAST CT ABDOMEN AND PELVIS WITH AND WITHOUT CONTRAST TECHNIQUE: Multidetector CT imaging of the chest was performed during intravenous contrast administration. Multidetector CT imaging of the abdomen and pelvis was performed following the standard protocol before and during bolus administration of intravenous contrast. CONTRAST:  133mL OMNIPAQUE IOHEXOL 300 MG/ML  SOLN COMPARISON:  CT chest 01/15/2019. Abdomen and pelvis CT 10/04/2018. FINDINGS: CT CHEST FINDINGS Cardiovascular: The heart size is normal. No substantial pericardial effusion. Atherosclerotic calcification is noted in the wall of the thoracic aorta.  Mediastinum/Nodes: No mediastinal lymphadenopathy. There is no hilar lymphadenopathy. The esophagus has normal imaging features. There is no axillary lymphadenopathy. Lungs/Pleura: Centrilobular emphsyema noted. Mixed attenuation lesion posterior right upper lobe shows continued progression of a solid posterior nodular component now measuring 13 mm (69/17) compared to 6 mm previously. Architectural distortion with associated consolidative airspace opacity in the parahilar right lung is similar to 10/04/2018. Volume loss in the anterior right lower lobe along the major fissure is  stable and presumably reflects post treatment changes. 9 mm subpleural nodule in the anterior left upper lobe (76/17) is similar to prior. 18 mm ground-glass opacity in the medial left upper lobe on 78/17 is similar. 7 mm posterior left upper lobe nodule on 84/17 is without substantial interval change. Multiple ground-glass opacities in the left lower lobe show no substantial change. Musculoskeletal: Thoracolumbar fusion hardware again noted. No worrisome lytic or sclerotic osseous abnormality. CT ABDOMEN AND PELVIS FINDINGS Hepatobiliary: Nodular liver contour is compatible with cirrhosis. No suspicious focal abnormality within the liver parenchyma. Tiny calcified gallstones evident. No intrahepatic or extrahepatic biliary dilation. Pancreas: No focal mass lesion. No dilatation of the main duct. No intraparenchymal cyst. No peripancreatic edema. Spleen: No splenomegaly. No focal mass lesion. Adrenals/Urinary Tract: No adrenal nodule or mass. Posterior ablation defect in the upper pole right kidney has become more well-defined in the interval measuring 2.0 x 2.5 cm today. No features to suggest local recurrence. Tiny hypoattenuating lesions in the left kidney are too small to characterize but similar to prior and likely benign. No evidence for hydroureter. The urinary bladder appears normal for the degree of distention. Stomach/Bowel:  Stomach is unremarkable. No gastric wall thickening. No evidence of outlet obstruction. Duodenum is normally positioned as is the ligament of Treitz. No small bowel wall thickening. No small bowel dilatation. The terminal ileum is normal. The appendix is normal. No gross colonic mass. No colonic wall thickening. Diverticular changes are noted in the left colon without evidence of diverticulitis. Vascular/Lymphatic: There is abdominal aortic atherosclerosis without aneurysm. There is no gastrohepatic or hepatoduodenal ligament lymphadenopathy. No retroperitoneal or mesenteric lymphadenopathy. No pelvic sidewall lymphadenopathy. Reproductive: The uterus is unremarkable. Cystic mass in the right adnexal space has progressed in the interval, measuring 6.5 x 5.1 cm today compared to 3.6 x 2.9 cm previously. No enhancing mural nodule evident. Other: No intraperitoneal free fluid. Musculoskeletal: Lumbosacral fusion hardware evident. No worrisome lytic or sclerotic osseous abnormality. IMPRESSION: 1. Continued progression of the solid nodular component of a mixed attenuation posterior right upper lobe pulmonary lesion, now measuring 13 mm compared to 6 mm previously. PET-CT may be warranted to further evaluate. 2. Similar appearance of post treatment changes in the parahilar right lung. 3. Stable appearance of bilateral ground-glass nodules. Continued attention on follow-up recommended. 4. Interval progression of the cystic mass in the right adnexal space, now measuring up to 6.5 cm compared to 3.6 cm previously. 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 Pelvic ultrasound recommended to further evaluate. This recommendation follows ACR consensus guidelines: White Paper of the ACR Incidental Findings Committee II on Adnexal Findings. J Am Coll Radiol 2013:10:675-681. 5. Stable appearance post ablation defect upper pole right kidney. No evidence for local recurrence or metastatic disease. 6.  Cirrhosis. 7. Cholelithiasis. 8. Left colonic diverticulosis without diverticulitis. 9. Aortic Atherosclerosis (ICD10-I70.0) and Emphysema (ICD10-J43.9). Electronically Signed   By: Misty Stanley M.D.   On: 07/20/2019 14:44   US Abdomen Limited  Result Date: 07/04/2019 CLINICAL DATA:  History of cirrhosis now with abdominal distention. Please perform ascites search ultrasound and ultrasound-guided paracentesis as indicated. EXAM: LIMITED ABDOMEN ULTRASOUND FOR ASCITES TECHNIQUE: Limited ultrasound survey for ascites was performed in all four abdominal quadrants. COMPARISON:  Abdominal ultrasound - 04/09/2019; ascites search ultrasound-11/14/2018; CT abdomen and pelvis-01/31/2018 FINDINGS: Sonographic evaluation of the abdomen is negative for any intra-abdominal ascites. No paracentesis performed. IMPRESSION: No evidence of intra-abdominal ascites.  No paracentesis attempted. Electronically Signed   By:  Sandi Mariscal M.D.   On: 07/04/2019 10:04    No images are attached to the encounter.   CMP Latest Ref Rng & Units 07/04/2019  Glucose 70 - 99 mg/dL 166(H)  BUN 6 - 20 mg/dL 8  Creatinine 0.44 - 1.00 mg/dL 0.56  Sodium 135 - 145 mmol/L 132(L)  Potassium 3.5 - 5.1 mmol/L 3.7  Chloride 98 - 111 mmol/L 93(L)  CO2 22 - 32 mmol/L 27  Calcium 8.9 - 10.3 mg/dL 8.8(L)  Total Protein 6.5 - 8.1 g/dL 8.1  Total Bilirubin 0.3 - 1.2 mg/dL 0.7  Alkaline Phos 38 - 126 U/L 93  AST 15 - 41 U/L 39  ALT 0 - 44 U/L 20   CBC Latest Ref Rng & Units 07/20/2019  WBC 4.0 - 10.5 K/uL 6.2  Hemoglobin 12.0 - 15.0 g/dL 11.6(L)  Hematocrit 36.0 - 46.0 % 36.5  Platelets 150 - 400 K/uL 136(L)     Observation/objective: Appears fatigued and tearful.  In no acute distress  Assessment and plan: Patient is a 59 year old female with following issues:  1.  Bilateral lung nodules: These have been persistent and overall stable to mildly growing since 2018.  Her present scan showed no significant growth in those bilateral lung  nodules and as such can be monitored.She was found to have a right upper lobe lung nodule in the past which showed a low SUV uptake on PET scan but was biopsy-proven adenocarcinoma lipidic pattern that was treated with SBRT.  There is a subtle progression of this nodule with the solid component measuring 13 mm.  Given the overall slow progression of the right upper lobe lung nodule I am inclined to monitor this conservatively with a repeat CT chest in 6 months time.  However I will discuss her case at tumor board to see if a repeat PET scan or a biopsy would be warranted.  I will also touch base with radiation oncology to see if they would be wanting to radiate this lesion empirically at this time.  We will reach out to the patient after tumor board discussion  2.  Iron deficiency anemia: She is mildly anemic with a hemoglobin of 11.6Ferritin and iron studies do reveal evidence of iron deficiency and she would benefit from [facility] that she has received in the past.  She will proceed with 2 more doses at this time.  3.  Renal cell carcinoma s/p cryoablation with no concern for progression.  Continue to monitor  4. Patient was also noted to have a right adnexal cyst on her CT abdomen which has grown as compared to prior imaging in July 2020.  I will therefore order a transvaginal pelvic ultrasound obtain a CA-125 and refer her to GYN Follow-up instructions: I will see her in 6 months with labs and CT scan prior  I discussed the assessment and treatment plan with the patient. The patient was provided an opportunity to ask questions and all were answered. The patient agreed with the plan and demonstrated an understanding of the instructions.   The patient was advised to call back or seek an in-person evaluation if the symptoms worsen or if the condition fails to improve as anticipated.    Visit Diagnosis: 1. Malignant neoplasm of upper lobe of right lung ([place])   2. Iron deficiency anemia, unspecified  iron deficiency anemia type   3. History of renal cell carcinoma   4. Adnexal cyst     Dr. Randa Evens, MD, MPH [place] [facility]  [facility] Tel- [phone] 07/27/2019 2:39 PM

## 2019-07-28 LAB — CA 125: Cancer Antigen (CA) 125: 41 U/mL — ABNORMAL HIGH (ref 0.0–38.1)

## 2019-07-30 ENCOUNTER — Other Ambulatory Visit: Payer: Self-pay | Admitting: *Deleted

## 2019-07-30 ENCOUNTER — Telehealth: Payer: Self-pay | Admitting: *Deleted

## 2019-07-30 DIAGNOSIS — N949 Unspecified condition associated with female genital organs and menstrual cycle: Secondary | ICD-10-CM

## 2019-07-30 DIAGNOSIS — R935 Abnormal findings on diagnostic imaging of other abdominal regions, including retroperitoneum: Secondary | ICD-10-CM

## 2019-07-30 NOTE — Progress Notes (Signed)
amb ref to  

## 2019-07-30 NOTE — Telephone Encounter (Signed)
Called pt and spoke about ca 125 monitoring and it is screening tool and since ct showed that adnexal mass has grown then the u/s will give [place] a better picture and because it has grown the best folks to give up expert opinion is GYN and we have doctors that come from [facility] to see pt's and they have opening 5/12 1  pm and it is here at cancer center. She is agreeable to appts but not happy about pelvic exam

## 2019-07-30 NOTE — Telephone Encounter (Signed)
Patient called stating that she does not understand her lab results and would like a return call to go ver them [phone] CA 125 Order: [id number] Status:  Final result  Visible to patient:  Yes (MyChart)  Next appt:  07/31/2019 at 11:30 AM in Oncology ([facility] 19)  Dx:  Adnexal cyst  Ref Range & Units 3 d ago  Cancer Antigen (CA) 125 0.0 - 38.1 U/mL 41.0High    Comment: (NOTE)  Roche Diagnostics Electrochemiluminescence Immunoassay (ECLIA)  Values obtained with different assay methods or kits cannot be  used interchangeably. Results cannot be interpreted as absolute  evidence of the presence or absence of malignant disease.  Performed At: [facility]  [place], [place] [id number]  Rush Farmer MD RP:[id number]   Resulting Agency  [facility]      Specimen Collected: 07/27/19 11:15 Last Resulted: 07/28/19 03:35

## 2019-07-31 ENCOUNTER — Other Ambulatory Visit: Payer: Self-pay

## 2019-07-31 ENCOUNTER — Inpatient Hospital Stay: Payer: Medicare Other | Attending: Oncology

## 2019-07-31 ENCOUNTER — Other Ambulatory Visit: Payer: Self-pay | Admitting: *Deleted

## 2019-07-31 VITALS — BP 117/75 | HR 89 | Temp 97.0°F | Resp 18

## 2019-07-31 DIAGNOSIS — K7031 Alcoholic cirrhosis of liver with ascites: Secondary | ICD-10-CM | POA: Insufficient documentation

## 2019-07-31 DIAGNOSIS — C649 Malignant neoplasm of unspecified kidney, except renal pelvis: Secondary | ICD-10-CM | POA: Diagnosis not present

## 2019-07-31 DIAGNOSIS — C3490 Malignant neoplasm of unspecified part of unspecified bronchus or lung: Secondary | ICD-10-CM | POA: Insufficient documentation

## 2019-07-31 DIAGNOSIS — B182 Chronic viral hepatitis C: Secondary | ICD-10-CM | POA: Diagnosis not present

## 2019-07-31 DIAGNOSIS — D509 Iron deficiency anemia, unspecified: Secondary | ICD-10-CM | POA: Insufficient documentation

## 2019-07-31 DIAGNOSIS — D500 Iron deficiency anemia secondary to blood loss (chronic): Secondary | ICD-10-CM

## 2019-07-31 DIAGNOSIS — N83201 Unspecified ovarian cyst, right side: Secondary | ICD-10-CM | POA: Insufficient documentation

## 2019-07-31 DIAGNOSIS — C3411 Malignant neoplasm of upper lobe, right bronchus or lung: Secondary | ICD-10-CM

## 2019-07-31 DIAGNOSIS — F17210 Nicotine dependence, cigarettes, uncomplicated: Secondary | ICD-10-CM | POA: Insufficient documentation

## 2019-07-31 MED ORDER — SODIUM CHLORIDE 0.9 % IV SOLN
510.0000 mg | Freq: Once | INTRAVENOUS | Status: AC
  Administered 2019-07-31: 510 mg via INTRAVENOUS
  Filled 2019-07-31: qty 510

## 2019-07-31 MED ORDER — SODIUM CHLORIDE 0.9 % IV SOLN
Freq: Once | INTRAVENOUS | Status: AC
  Filled 2019-07-31: qty 250

## 2019-07-31 NOTE — Progress Notes (Signed)
amb ref °

## 2019-08-03 ENCOUNTER — Institutional Professional Consult (permissible substitution): Payer: Medicare Other | Admitting: Radiation Oncology

## 2019-08-07 ENCOUNTER — Other Ambulatory Visit: Payer: Self-pay

## 2019-08-07 ENCOUNTER — Ambulatory Visit
Admission: RE | Admit: 2019-08-07 | Discharge: 2019-08-07 | Disposition: A | Discharge status: Home / Self Care | Payer: Medicare Other | Source: Ambulatory Visit | Attending: Oncology | Admitting: Oncology

## 2019-08-07 DIAGNOSIS — N949 Unspecified condition associated with female genital organs and menstrual cycle: Secondary | ICD-10-CM | POA: Diagnosis not present

## 2019-08-07 DIAGNOSIS — M25551 Pain in right hip: Secondary | ICD-10-CM | POA: Insufficient documentation

## 2019-08-08 ENCOUNTER — Telehealth: Payer: Self-pay | Admitting: *Deleted

## 2019-08-08 ENCOUNTER — Inpatient Hospital Stay (HOSPITAL_BASED_OUTPATIENT_CLINIC_OR_DEPARTMENT_OTHER): Payer: Medicare Other | Admitting: Obstetrics and Gynecology

## 2019-08-08 VITALS — BP 119/71 | HR 84 | Temp 98.1°F | Resp 16 | Wt 208.0 lb

## 2019-08-08 DIAGNOSIS — N83209 Unspecified ovarian cyst, unspecified side: Secondary | ICD-10-CM | POA: Insufficient documentation

## 2019-08-08 DIAGNOSIS — N83201 Unspecified ovarian cyst, right side: Secondary | ICD-10-CM | POA: Diagnosis not present

## 2019-08-08 DIAGNOSIS — F17210 Nicotine dependence, cigarettes, uncomplicated: Secondary | ICD-10-CM | POA: Diagnosis not present

## 2019-08-08 DIAGNOSIS — N949 Unspecified condition associated with female genital organs and menstrual cycle: Secondary | ICD-10-CM

## 2019-08-08 NOTE — Progress Notes (Signed)
Pt went for the u/s and the staff told her that she had a full bladder but she could not urinate. She normally has to go to bathroom quickly as soon as the moment hits her. She sometimes dribbles. She take linzess for constipation and it going well. She has swelling in her abdomen and she does have pain and pressure there

## 2019-08-08 NOTE — Telephone Encounter (Signed)
Pt has appt with GYN today , the u/s was ordered just for gyn to have it to see her today

## 2019-08-08 NOTE — Progress Notes (Signed)
Gynecologic Oncology Consult Visit   Referring Provider: Dr Janese Banks  Chief Concern: right ovarian cyst  Subjective:  Audrey Garrett is a 59 y.o. female who is seen in consultation from Dr. Janese Banks for ovarian cyst.  Patient treated by Dr Janese Banks for iron deficiency, hepatitis C/cirrhosis s/p Harvoni treatment, renal cell carcinoma s/p ablation and stage I lung cancer s/p SBRT but still PET positive.  Found to have cystic right adnexal mass on CT that is enlarging relative to prior scan. .   CT scan 07/20/19 IMPRESSION: 1. Continued progression of the solid nodular component of a mixed attenuation posterior right upper lobe pulmonary lesion, now measuring 13 mm compared to 6 mm previously. PET-CT may be warranted to further evaluate. 2. Similar appearance of post treatment changes in the parahilar right lung. 3. Stable appearance of bilateral ground-glass nodules. Continued attention on follow-up recommended. 4. Interval progression of the cystic mass in the right adnexal space, now measuring up to 6.5 cm compared to 3.6 cm previously. 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 Pelvic ultrasound recommended to further evaluate.  5. Stable appearance post ablation defect upper pole right kidney. No evidence for local recurrence or metastatic disease. 6. Cirrhosis. 7. Cholelithiasis. 8. Left colonic diverticulosis without diverticulitis. 9. Aortic Atherosclerosis (ICD10-I70.0) and Emphysema (ICD10-J43.9).  Pelvic US 08/07/19 IMPRESSION: Suboptimal visualization of uterus and endometrial complex with nonvisualization of LEFT ovary. Large cyst in RIGHT adnexa 7.2 cm in greatest diameter, increased in volume by 20% since the prior CT exam and complicated by scattered internal echoes. Surgical evaluation recommended to exclude cystic ovarian neoplasm.  CA125 07/27/19 = 41  Complains of some abdominal distention, but no ascites on CT last month.  She has had  cirrhosis with need for paracentesis in the past, but not in years.   Problem List: Patient Active Problem List   Diagnosis Date Noted  . Charcot's joint, right ankle and foot 01/26/2019  . Pharmacologic therapy 12/11/2018  . Disorder of skeletal system 12/11/2018  . Problems influencing health status 12/11/2018  . DDD (degenerative disc disease), lumbosacral 12/11/2018  . Coccygodynia 12/11/2018  . Cancer-related pain 12/10/2018  . Closed fracture of proximal end of left humerus 11/03/2018  . Charcot's joint of left foot 06/27/2018  . Dislocation of tarsal joint of right foot 05/09/2018  . Gastroesophageal reflux disease without esophagitis 05/09/2018  . H/O malignant neoplasm of lung 05/09/2018  . H/O renal cell cancer 05/09/2018  . Hyponatremia 05/09/2018  . Type 2 diabetes mellitus ([place]) 05/09/2018  . Anemia, iron deficiency 05/09/2018  . Essential hypertension 05/09/2018  . Lung nodule 05/09/2018  . Chronic left sacroiliac joint pain 04/20/2018  . Swelling of left lower extremity 04/20/2018  . Iron deficiency anemia 02/03/2018  . Renal lesion 12/14/2017  . Malignant neoplasm of upper lobe of right lung ([place]) 12/02/2016  . Chronic groin pain, left 08/19/2016  . Personal history of tobacco use, presenting hazards to health 08/04/2016  . Osteoarthritis of hip (Bilateral) 07/12/2016  . Chronic hip pain (Bilateral) 07/07/2016  . Chronic hepatitis C without hepatic coma ([place]) 05/13/2016  . Bilateral leg edema 04/07/2016  . Opiate use 01/31/2016  . RLS (restless legs syndrome) 02/08/2016  . Iron deficiency anemia due to chronic blood loss 02/11/2016  . Controlled type 2 diabetes mellitus with hyperglycemia ([place]) 01/21/2016  . Acute blood loss anemia 01/21/2016  . Malnutrition of moderate degree 01/21/2016  . Chronic pain syndrome   . Hernia of anterior abdominal wall   .  Incarcerated umbilical hernia   . Neurogenic pain 10/01/2015  . S/P thoracolumbar fusion (T5-L5)  10/01/2015  . Grade 1 Retrolisthesis (4 mm) of C5 over C6 and C6 over C7 10/01/2015  . Cervical foraminal stenosis (Left C5-6; Bilateral C6-7) 10/01/2015  . Acute GI bleeding 09/22/2015  . Depression, major, recurrent, moderate ([place]) 05/27/2015  . Abdominal pain, chronic, epigastric 05/27/2015  . Difficulty urinating 05/26/2015  . Nephrolithiasis 05/26/2015  . Alcoholic cirrhosis of liver with ascites ([place]) 05/26/2015  . Hypotension 05/22/2015  . Anemia 05/22/2015  . Thrombocytopenia ([place]) 05/22/2015  . Coagulopathy ([place]) 05/22/2015  . Hyperglycemia 05/22/2015  . Polyneuropathy 05/22/2015  . Urinary retention 05/22/2015  . Hepatic cirrhosis ([place]) 05/19/2015  . Cirrhosis of liver with ascites ([place]) 05/19/2015  . Hypomagnesemia (low magnesium levels) 04/07/2015  . At risk for osteopenia 04/03/2015  . Lumbar spondylosis 04/03/2015  . Chronic lumbar radicular pain (Left) 04/03/2015  . Chronic neck pain 04/03/2015  . Chronic lower extremity pain (Secondary area of Pain) (Left) 04/03/2015  . Chronic sacroiliac joint pain (Bilateral) (R>L) 04/03/2015  . Chronic upper back pain 04/03/2015  . Long term current use of opiate analgesic 04/03/2015  . Encounter for chronic pain management 04/03/2015  . Cervical radicular pain 01/01/2015  . Uncomplicated opioid dependence ([place]) 01/01/2015  . Long term prescription opiate use 01/01/2015  . 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 Lumbar facet syndrome (Bilateral) (L>R) 01/01/2015  . Osteoarthritis of spine with radiculopathy, lumbar region 01/01/2015  . Chronic musculoskeletal pain 01/01/2015  . Chronic low back pain (Primary Area of Pain) (Bilateral) (L>R) 01/01/2015  . Insomnia, persistent 12/25/2014  . BP (high blood pressure) 12/25/2014  . HCV (hepatitis C virus) 12/25/2014  . Depression, major, in remission ([place]) 12/25/2014    Past Medical History: Past Medical History:  Diagnosis Date  . Alcohol abuse   . Alcoholic cirrhosis  ([place])    GI-- dr skulskie/  hx ascites 2017   . Anxiety   . Chronic hepatitis C ([place])    treated w/ harvoni(2018)  . Chronic LBP 12/25/2014   Overview:  Post extensive surgery   . Chronic pain syndrome    neck , upper back, post extensive spinal surgery  . DDD (degenerative disc disease), lumbar   . Diverticulosis   . Emphysema lung ([place])   . Fibromyalgia   . Full dentures    partials, upper and lower  . GERD (gastroesophageal reflux disease)    gastroparesis  . Hiatal hernia   . History of GI bleed    06/ 2017  ulcers and gastric erosions, transfused x4/  10/ 2017 unknown source/  01/ 2018  transfused x2 units and IV Iron   . Hypertension   . Iron deficiency anemia due to chronic blood loss    hemotologist-- dr Alvy Bimler  . Lumbar facet joint syndrome   . Lumbar spondylosis   . Lung cancer ([place]) 11/25/2016   right upper lobe nodule , Stage I,  treatment SBRT  . MDD (major depressive disorder)   . Nephrolithiasis    per CT 09-30-2017 right side nonobstructive  . OA (osteoarthritis of spine)   . Polyarthritis   . Polyneuropathy   . Restless leg syndrome   . Right renal mass   . RLS (restless legs syndrome)   . SOB (shortness of breath)   . Type 2 diabetes mellitus ([place])    followed by pcp  . Urinary frequency   . Vitamin D deficiency disease     Past  Surgical History: Past Surgical History:  Procedure Laterality Date  . COLONOSCOPY WITH PROPOFOL N/A 09/25/2015   Procedure: COLONOSCOPY WITH PROPOFOL;  Surgeon: Lollie Sails, MD;  [facility] ENDOSCOPY;  Service: Endoscopy;  Laterality: N/A;  . COLONOSCOPY WITH PROPOFOL N/A 04/01/2016   Procedure: COLONOSCOPY WITH PROPOFOL;  Surgeon: Lollie Sails, MD;  Location: [facility] ENDOSCOPY;  Service: Endoscopy;  Laterality: N/A;  . ELECTROMAGNETIC NAVIGATION BROCHOSCOPY N/A 11/25/2016   Procedure: ELECTROMAGNETIC NAVIGATION BRONCHOSCOPY;  Surgeon: Flora Lipps, MD;  Location: [facility] ORS;  Service: Cardiopulmonary;  Laterality:  N/A;  . ENDOMETRIAL ABLATION  2007  . ESOPHAGOGASTRODUODENOSCOPY N/A 04/14/2015   Procedure: ESOPHAGOGASTRODUODENOSCOPY (EGD);  Surgeon: Lollie Sails, MD;  Location: [facility] ENDOSCOPY;  Service: Endoscopy;  Laterality: N/A;  . ESOPHAGOGASTRODUODENOSCOPY (EGD) WITH PROPOFOL N/A 09/16/2015   Procedure: ESOPHAGOGASTRODUODENOSCOPY (EGD) WITH PROPOFOL;  Surgeon: Lollie Sails, MD;  Location: [facility] ENDOSCOPY;  Service: Endoscopy;  Laterality: N/A;  . ESOPHAGOGASTRODUODENOSCOPY (EGD) WITH PROPOFOL N/A 01/22/2016   Procedure: ESOPHAGOGASTRODUODENOSCOPY (EGD) WITH PROPOFOL;  Surgeon: [name], MD;  Location: [facility];  Service: Endoscopy;  Laterality: N/A;  . ESOPHAGOGASTRODUODENOSCOPY (EGD) WITH PROPOFOL N/A 04/01/2016   Procedure: ESOPHAGOGASTRODUODENOSCOPY (EGD) WITH PROPOFOL;  Surgeon: Lollie Sails, MD;  Location: [facility] ENDOSCOPY;  Service: Endoscopy;  Laterality: N/A;  . FOOT ARTHRODESIS Right 04/06/2018   Procedure: ARTHRODESIS - HALLUX/IPJOINT;  Surgeon: [name], DPM;  Location: [place] [facility];  Service: Podiatry;  Laterality: Right;  HAMMERLOCK IMPLANTS PARAGON 4-0 SCREW LMA LOCAL Diabetic - oral meds  . IR RADIOLOGIST EVAL & MGMT  04/13/2017  . IR RADIOLOGIST EVAL & MGMT  11/15/2017  . RADIOLOGY WITH ANESTHESIA Right 12/14/2017   Procedure: CT MICROWAVE THERMAL ABLATION AND BIOPSY WITH ANESTHESIA;  Surgeon: Markus Daft, MD;  Location: [facility] ORS;  Service: Anesthesiology;  Laterality: Right;  . SPINAL FUSION  12/19/2011   T5 to sacrum (rod, screws)  . TONSILLECTOMY  age 42  . TUBAL LIGATION Bilateral yrs ago  . UMBILICAL HERNIA REPAIR N/A 11/06/2015   Procedure: HERNIA REPAIR UMBILICAL ADULT;  Surgeon: [name], MD;  Location: [facility] ORS;  Service: General;  Laterality: N/A;    Past Gynecologic History:  Stopped having menses at age 59 after endometrial ablation for menorrhagia.  OB History:  P2  Family History: Family History  Problem Relation Age of  Onset  . Stroke Mother   . Heart disease Mother   . [name] Father   . Breast cancer Sister 86  . Kidney disease Neg Hx   . Bladder Cancer Neg Hx   . Kidney cancer Neg Hx   . Prostate cancer Neg Hx     Social History: Social History   Socioeconomic History  . Marital status: Married    Spouse name: Not on file  . Number of children: Not on file  . Years of education: Not on file  . Highest education level: Not on file  Occupational History  . Not on file  Tobacco Use  . Smoking status: Current Every Day Smoker    Packs/day: 0.50    Years: 45.00    Pack years: 22.50    Types: Cigarettes  . Smokeless tobacco: Never Used  . Tobacco comment: 12-05-2017 2ppd until 2017 down to 1/2 ppd  Substance and Sexual Activity  . Alcohol use: Not Currently    Alcohol/week: 0.0 standard drinks    Comment: did drink 12 pack beer per week (7 oz each)  . Drug use: No  . Sexual  activity: Not on file  Other Topics Concern  . Not on file  Social History Narrative   Lives at home with husband   Social Determinants of Health   Financial Resource Strain:   . Difficulty of Paying Living Expenses:   Food Insecurity:   . Worried About Charity fundraiser in the Last Year:   . Arboriculturist in the Last Year:   Transportation Needs:   . Film/video editor (Medical):   Marland Kitchen Lack of Transportation (Non-Medical):   Physical Activity:   . Days of Exercise per Week:   . Minutes of Exercise per Session:   Stress:   . Feeling of Stress :   Social Connections:   . Frequency of Communication with Friends and Family:   . Frequency of Social Gatherings with Friends and Family:   . Attends Religious Services:   . Active Member of Clubs or Organizations:   . Attends Archivist Meetings:   Marland Kitchen Marital Status:   Intimate Partner Violence:   . Fear of Current or Ex-Partner:   . Emotionally Abused:   Marland Kitchen Physically Abused:   . Sexually Abused:     Allergies: No Known  Allergies  Current Medications: Current Outpatient Medications  Medication Sig Dispense Refill  . cyclobenzaprine (FLEXERIL) 5 MG tablet Take 1 tablet (5 mg total) by mouth 3 (three) times daily as needed for muscle spasms. 90 tablet 5  . escitalopram (LEXAPRO) 10 MG tablet Take 10 mg by mouth daily.     . furosemide (LASIX) 40 MG tablet Take 40 mg by mouth 2 (two) times daily.     Marland Kitchen LINZESS 145 MCG CAPS capsule Take 145 mcg by mouth every morning.    . metFORMIN (GLUCOPHAGE) 850 MG tablet Take 850 mg by mouth 2 (two) times daily.    . Oxycodone HCl 20 MG TABS Take 1 tablet (20 mg total) by mouth every 6 (six) hours. Must last 30 days 120 tablet 0  . Oxycodone HCl 20 MG TABS Take 1 tablet (20 mg total) by mouth every 6 (six) hours. Must last 30 days 120 tablet 0  . [START ON 08/10/2019] Oxycodone HCl 20 MG TABS Take 1 tablet (20 mg total) by mouth every 6 (six) hours. Must last 30 days 120 tablet 0  . OZEMPIC 1 MG/DOSE SOPN Inject 1 mg into the skin every Tuesday.    . pantoprazole (PROTONIX) 40 MG tablet Take 40 mg by mouth 2 (two) times daily.   11  . pregabalin (LYRICA) 200 MG capsule Take 200 mg by mouth 2 (two) times daily.    . rosuvastatin (CRESTOR) 5 MG tablet Take 5 mg by mouth at bedtime.    Marland Kitchen spironolactone (ALDACTONE) 50 MG tablet Take 100 mg by mouth daily.     . TRESIBA FLEXTOUCH 200 UNIT/ML FlexTouch Pen SMARTSIG:10 Unit(s) SUB-Q Daily    . Multiple Vitamin (MULTIVITAMIN WITH MINERALS) TABS tablet Take 1 tablet by mouth daily.     No current facility-administered medications for this visit.    Review of Systems General: negative for, fevers, chills Skin: negative for changes in color, texture, moles or lesions Eyes: negative for, changes in vision, pain, diplopia HEENT: negative for, change in hearing, pain, discharge, tinnitus, vertigo, voice changes, sore throat, neck masses Breasts: negative for breast lumps Pulmonary: negative for, productive cough Cardiac: negative  for, palpitations, syncope, pain, discomfort, pressure Hematology: negative for, bleeding Neurologic/Psych: negative for, headaches, seizures, paralysis, weakness, tremor, change in  sensation, change in memory  Objective:  Physical Examination:  BP 119/71   Pulse 84   Temp 98.1 F (36.7 C) (Tympanic)   Resp 16   Wt 208 lb (94.3 kg)   BMI 29.84 kg/m    ECOG Performance Status: 2 - Symptomatic, <50% confined to bed  General appearance: alert, cooperative and appears stated age HEENT:PERRLA, neck supple with midline trachea and thyroid without masses Lymph node survey: non-palpable, axillary, inguinal, supraclavicular Chest: spider angiomas Cardiovascular: regular rate and rhythm, no murmurs or gallops Respiratory: normal air entry, lungs clear to auscultation and no rales, rhonchi or wheezing Abdomen: protuberant, no hernias and well healed incision Back: inspection of back is normal Extremities: boots on feet bilaterally and some edema. Skin exam - normal coloration and turgor, no rashes, no suspicious skin lesions noted. Neurological exam reveals alert, oriented, normal speech, no focal findings or movement disorder noted.  Pelvic: exam chaperoned by nurse, EGBUS within normal limits;  Vulva: normal appearing vulva with no masses, tenderness or lesions; Vagina: normal vagina; Adnexa: normal adnexa in size, nontender and no masses; Uterus: uterus is normal size, shape, consistency and nontender; Cervix: anteverted; Rectal: normal rectal, no masses     Assessment:  Audrey Garrett is a 59 y.o. female diagnosed with 7 cm cystic right ovarian mass that has increased in size on most recent CT from 3.6 cm.  This is an incidental finding and not likely causing abdominal distention. She is asymptomatic from the mass.      Medical co-morbidities complicating care: Hepatitis C treated, Lung cancer, Renal Cancer, Anemia, .  Plan:   Problem List Items Addressed This Visit    None    Visit  Diagnoses    Adnexal cyst    -  Primary   Relevant Orders   IGP, Aptima HPV     We discussed options for management including surgery vs surveillance.  Based on her multiple medical problems and the low probability of the ovarian mass being cancer, based on CA125 and [place], recommended surveillance with repeat US in 3 months.  Discussed with Dr Janese Banks and she is in agreement with the plan.    The patient's diagnosis, an outline of the further diagnostic and laboratory studies which will be required, the recommendation, and alternatives were discussed.  All questions were answered to the patient's satisfaction.  A total of 45 minutes were spent with the patient/family today; 40% was spent in education, counseling and coordination of care for right ovarian cystic mass.    Mellody Drown, MD   CC:  Jodi Marble, MD [address] [place],  [place] [address] [phone]

## 2019-08-08 NOTE — Telephone Encounter (Signed)
Called report  US IMPRESSION: Suboptimal visualization of uterus and endometrial complex with nonvisualization of LEFT ovary.  Large cyst in RIGHT adnexa 7.2 cm in greatest diameter, increased in volume by 20% since the prior CT exam and complicated by scattered internal echoes.  Surgical evaluation recommended to exclude cystic ovarian neoplasm.  These results will be called to the ordering clinician or representative by the Radiologist Assistant, and communication documented in the PACS or [employer].   Electronically Signed   By: Lavonia Dana M.D.   On: 08/07/2019 17:37

## 2019-08-10 ENCOUNTER — Ambulatory Visit: Payer: Medicare Other | Admitting: Physician Assistant

## 2019-08-14 LAB — IGP, APTIMA HPV: HPV Aptima: NEGATIVE

## 2019-08-15 ENCOUNTER — Other Ambulatory Visit: Payer: Self-pay

## 2019-08-15 ENCOUNTER — Encounter: Payer: Medicare Other | Attending: Internal Medicine | Admitting: Internal Medicine

## 2019-08-15 DIAGNOSIS — B1920 Unspecified viral hepatitis C without hepatic coma: Secondary | ICD-10-CM | POA: Diagnosis not present

## 2019-08-15 DIAGNOSIS — K7030 Alcoholic cirrhosis of liver without ascites: Secondary | ICD-10-CM | POA: Diagnosis not present

## 2019-08-15 DIAGNOSIS — E11622 Type 2 diabetes mellitus with other skin ulcer: Secondary | ICD-10-CM | POA: Diagnosis not present

## 2019-08-15 DIAGNOSIS — Z85528 Personal history of other malignant neoplasm of kidney: Secondary | ICD-10-CM | POA: Diagnosis not present

## 2019-08-15 DIAGNOSIS — Z85118 Personal history of other malignant neoplasm of bronchus and lung: Secondary | ICD-10-CM | POA: Diagnosis not present

## 2019-08-15 DIAGNOSIS — J449 Chronic obstructive pulmonary disease, unspecified: Secondary | ICD-10-CM | POA: Diagnosis not present

## 2019-08-15 DIAGNOSIS — L97329 Non-pressure chronic ulcer of left ankle with unspecified severity: Secondary | ICD-10-CM | POA: Diagnosis present

## 2019-08-15 DIAGNOSIS — L97322 Non-pressure chronic ulcer of left ankle with fat layer exposed: Secondary | ICD-10-CM | POA: Insufficient documentation

## 2019-08-15 DIAGNOSIS — E1142 Type 2 diabetes mellitus with diabetic polyneuropathy: Secondary | ICD-10-CM | POA: Insufficient documentation

## 2019-08-15 DIAGNOSIS — E11610 Type 2 diabetes mellitus with diabetic neuropathic arthropathy: Secondary | ICD-10-CM | POA: Diagnosis not present

## 2019-08-16 NOTE — Progress Notes (Signed)
Audrey Garrett, Audrey Garrett ([id number]) Visit Report for 08/15/2019 Allergy List Details Patient Name: Audrey Garrett, Audrey Garrett. Date of Service: 08/15/2019 1:00 PM Medical Record Number: [id number] Patient Account Number: [id number] Date of Birth/Sex: 05-08-1960 (59 y.o. F) Treating RN: Montey Hora Primary Care Ardys H[place]: Pernell Dupre Other Clinician: Referring Viviene Thurston: [place] Cellar Treating Tivis Wherry/Extender: Ricard Dillon Weeks in Treatment: 0 Allergies Active Allergies No Known Allergies Allergy Notes Electronic Signature(s) Signed: 08/15/2019 4:52:06 PM By: Montey Hora Entered By: Montey Hora on 08/15/2019 13:09:58 Audrey Garrett ([id number]) -------------------------------------------------------------------------------- [place] Information Details Patient Name: Audrey Garrett Date of Service: 08/15/2019 1:00 PM Medical Record Number: [id number] Patient Account Number: [id number] Date of Birth/Sex: 12-29-1960 (59 y.o. F) Treating RN: Cornell Barman Primary Care Rea Kalama: Pernell Dupre Other Clinician: Referring Gevon Markus: [place] Cellar Treating Isbella Arline/Extender: Tito Dine in Treatment: 0 Visit Information Patient Arrived: Cane Arrival Time: 13:03 Accompanied By: husband Transfer Assistance: None Patient Identification Verified: Yes Secondary Verification Process Completed: Yes History Since Last Visit Added or deleted any medications: No Any new allergies or adverse reactions: No Had a fall or experienced change in activities of daily living that may affect risk of falls: No Signs or symptoms of abuse/neglect since last visito No Hospitalized since last visit: No Implantable device outside of the clinic excluding cellular tissue based products placed in the center since last visit: No Electronic Signature(s) Signed: 08/15/2019 4:33:32 PM By: Lorine Bears RCP, RRT, CHT Entered By: Lorine Bears on 08/15/2019 13:05:19 Audrey Garrett  ([id number]) -------------------------------------------------------------------------------- Clinic Level of Care Assessment Details Patient Name: Audrey Garrett Date of Service: 08/15/2019 1:00 PM Medical Record Number: [id number] Patient Account Number: [id number] Date of Birth/Sex: 06/24/1960 (59 y.o. F) Treating RN: Cornell Barman Primary Care Rhyann Berton: Pernell Dupre Other Clinician: Referring Jasmyn Picha: [place] Cellar Treating Chae Shuster/Extender: Tito Dine in Treatment: 0 Clinic Level of Care Assessment Items TOOL 1 Quantity Score []  - Use when EandM and Procedure is performed on INITIAL visit 0 ASSESSMENTS - Nursing Assessment / Reassessment X - General Physical Exam (combine w/ comprehensive assessment (listed just below) when performed on new 1 20 pt. evals) X- 1 25 Comprehensive Assessment (HX, ROS, Risk Assessments, Wounds Hx, etc.) ASSESSMENTS - Wound and Skin Assessment / Reassessment []  - Dermatologic / Skin Assessment (not related to wound area) 0 ASSESSMENTS - Ostomy and/or Continence Assessment and Care []  - Incontinence Assessment and Management 0 []  - 0 Ostomy Care Assessment and Management (repouching, etc.) PROCESS - Coordination of Care X - Simple Patient / Family Education for ongoing care 1 15 []  - 0 Complex (extensive) Patient / Family Education for ongoing care X- 1 10 Staff obtains Programmer, systems, Records, Test Results / Process Orders []  - 0 Staff telephones HHA, Nursing Homes / Clarify orders / etc []  - 0 Routine Transfer to another Facility (non-emergent condition) []  - 0 Routine Hospital Admission (non-emergent condition) X- 1 15 New Admissions / Biomedical engineer / Ordering NPWT, Apligraf, etc. []  - 0 Emergency Hospital Admission (emergent condition) PROCESS - Special Needs []  - Pediatric / Minor Patient Management 0 []  - 0 Isolation Patient Management []  - 0 Hearing / Language / Visual special needs []  - 0 Assessment of  Community assistance (transportation, D/C planning, etc.) []  - 0 Additional assistance / Altered mentation []  - 0 Support Surface(s) Assessment (bed, cushion, seat, etc.) INTERVENTIONS - Miscellaneous []  - External ear exam 0 []  - 0 Patient Transfer (multiple staff / Civil Service fast streamer / Similar devices) []  - 0 Simple  Staple / Suture removal (25 or less) []  - 0 Complex Staple / Suture removal (26 or more) []  - 0 Hypo/Hyperglycemic Management (do not check if billed separately) X- 1 15 Ankle / Brachial Index (ABI) - do not check if billed separately Has the patient been seen at the hospital within the last three years: Yes Total Score: 100 Level Of Care: New/Established - Level 3 Audrey Garrett, Audrey Garrett ([id number]) Electronic Signature(s) Signed: 08/16/2019 9:48:50 AM By: Gretta Cool, BSN, RN, CWS, Kim RN, BSN Entered By: Gretta Cool, BSN, RN, CWS, Kim on 08/15/2019 13:52:44 Audrey Garrett ([id number]) -------------------------------------------------------------------------------- Encounter Discharge Information Details Patient Name: Audrey Garrett Date of Service: 08/15/2019 1:00 PM Medical Record Number: [id number] Patient Account Number: [id number] Date of Birth/Sex: 1961/02/13 (59 y.o. F) Treating RN: Cornell Barman Primary Care Jordany Russett: Pernell Dupre Other Clinician: Referring Miliano Cotten: [place] Cellar Treating Jemar Paulsen/Extender: Tito Dine in Treatment: 0 Encounter Discharge Information Items Post Procedure Vitals Discharge Condition: Stable Temperature (F): 99.6 Ambulatory Status: Ambulatory Pulse (bpm): 88 Discharge Destination: Home Respiratory Rate (breaths/min): 18 Transportation: Private Auto Blood Pressure (mmHg): 114/70 Accompanied By: husband Schedule Follow-up Appointment: Yes Clinical Summary of Care: Electronic Signature(s) Signed: 08/16/2019 9:48:50 AM By: Gretta Cool, BSN, RN, CWS, Kim RN, BSN Entered By: Gretta Cool, BSN, RN, CWS, Kim on 08/15/2019 13:55:53 Audrey Garrett  ([id number]) -------------------------------------------------------------------------------- Lower Extremity Assessment Details Patient Name: Audrey Garrett Date of Service: 08/15/2019 1:00 PM Medical Record Number: [id number] Patient Account Number: [id number] Date of Birth/Sex: October 26, 1960 (59 y.o. F) Treating RN: Montey Hora Primary Care Zalyn Amend: Pernell Dupre Other Clinician: Referring Undray Allman: [place] Cellar Treating Daryle Amis/Extender: Tito Dine in Treatment: 0 Edema Assessment Assessed: [Left: No] [Right: No] Edema: [Left: Yes] [Right: Yes] Calf Left: Right: Point of Measurement: 32 cm From Medial Instep 35.5 cm 35 cm Ankle Left: Right: Point of Measurement: 10 cm From Medial Instep 27 cm 26.5 cm Vascular Assessment Pulses: Dorsalis Pedis Palpable: [Left:Yes] [Right:Yes] Posterior Tibial Palpable: [Left:Yes] [Right:Yes] Blood Pressure: Brachial: [Left:116] [Right:110] Dorsalis Pedis: 132 [Left:Dorsalis Pedis: 242] Ankle: Posterior Tibial: 122 [Left:Posterior Tibial: 112 1.14] [Right:1.02] Electronic Signature(s) Signed: 08/15/2019 4:52:06 PM By: Montey Hora Entered By: Montey Hora on 08/15/2019 13:30:39 Audrey Garrett ([id number]) -------------------------------------------------------------------------------- Multi Wound Chart Details Patient Name: Audrey Garrett Date of Service: 08/15/2019 1:00 PM Medical Record Number: [id number] Patient Account Number: [id number] Date of Birth/Sex: 1960/05/21 (59 y.o. F) Treating RN: Cornell Barman Primary Care Oluwademilade Mckiver: Pernell Dupre Other Clinician: Referring Esparanza Krider: [place] Cellar Treating Afua Hoots/Extender: Tito Dine in Treatment: 0 Vital Signs Height(in): 70 Pulse(bpm): 1 Weight(lbs): 208 Blood Pressure(mmHg): 114/70 Body Mass Index(BMI): 30 Temperature(F): 98.6 Respiratory Rate(breaths/min): 16 Photos: [N/A:N/A] Wound Location: Left, Medial Ankle N/A N/A Wounding Event:  Gradually Appeared N/A N/A Primary Etiology: Diabetic Wound/Ulcer of the Lower N/A N/A Extremity Secondary Etiology: Pressure Ulcer N/A N/A Comorbid History: Chronic Obstructive Pulmonary N/A N/A Disease (COPD), Cirrhosis , Type II Diabetes, Received Radiation Date Acquired: 05/28/2019 N/A N/A Weeks of Treatment: 0 N/A N/A Wound Status: Open N/A N/A Pending Amputation on Yes N/A N/A Presentation: Measurements L x W x D (cm) 0.3x0.4x0.1 N/A N/A Area (cm) : 0.094 N/A N/A Volume (cm) : 0.009 N/A N/A Classification: Grade 1 N/A N/A Exudate Amount: Medium N/A N/A Exudate Type: Serous N/A N/A Exudate Color: amber N/A N/A Wound Margin: Flat and Intact N/A N/A Granulation Amount: Large (67-100%) N/A N/A Granulation Quality: Pink N/A N/A Necrotic Amount: Small (1-33%) N/A N/A Exposed Structures: Fat Layer (Subcutaneous Tissue) N/A N/A Exposed: Yes  Fascia: No Tendon: No Muscle: No Joint: No Bone: No Epithelialization: None N/A N/A Debridement: Debridement - Selective/Open N/A N/A Wound Pre-procedure Verification/Time 13:45 N/A N/A Out Taken: Pain Control: Lidocaine N/A N/A Tissue Debrided: Necrotic/Eschar N/A N/A Level: Skin/Dermis N/A N/A Debridement Area (sq cm): 0.12 N/A N/A Instrument: Curette N/A N/A Bleeding: Minimum N/A N/A Hemostasis Achieved: Pressure N/A N/A Audrey Garrett, Audrey Garrett ([id number]) Debridement Treatment Procedure was tolerated well N/A N/A Response: Post Debridement 0.3x0.4x0.1 N/A N/A Measurements L x W x D (cm) Post Debridement Volume: 0.009 N/A N/A (cm) Procedures Performed: Debridement N/A N/A Treatment Notes Wound #4 (Left, Medial Foot) 1. Cleansed with: Clean wound with Normal Saline 2. Anesthetic Topical Lidocaine 4% cream to wound bed prior to debridement 4. Dressing Applied: Prisma Ag 5. Secondary Dressing Applied Bordered Foam Dressing 6. Footwear/Offloading device applied Other footwear/offloading device applied (specify in notes) 7.  Secured with Tape 3 Layer Compression System - Left Lower Extremity Notes [place] Boot to left foot Electronic Signature(s) Signed: 08/15/2019 5:06:12 PM By: Linton Ham MD Entered By: Linton Ham on 08/15/2019 14:22:25 Audrey Garrett ([id number]) -------------------------------------------------------------------------------- [place] Details Patient Name: Audrey Garrett Date of Service: 08/15/2019 1:00 PM Medical Record Number: [id number] Patient Account Number: [id number] Date of Birth/Sex: May 19, 1960 (59 y.o. F) Treating RN: Cornell Barman Primary Care Teniya Filter: Pernell Dupre Other Clinician: Referring Shalisa Mcquade: [place] Cellar Treating [place] Esch/Extender: Tito Dine in Treatment: 0 Active Inactive Orientation to the Wound Care Program Nursing Diagnoses: Knowledge deficit related to the wound healing center program Goals: Patient/caregiver will verbalize understanding of the [facility] [place] Program Date Initiated: 08/15/2019 Target Resolution Date: 08/22/2019 Goal Status: Active Interventions: Provide education on orientation to the wound center Notes: Soft Tissue Infection Nursing Diagnoses: Impaired tissue integrity Goals: Patient will remain free of wound infection Date Initiated: 08/15/2019 Target Resolution Date: 08/22/2019 Goal Status: Active Interventions: Assess signs and symptoms of infection every visit Notes: Wound/Skin Impairment Nursing Diagnoses: Impaired tissue integrity Goals: Ulcer/skin breakdown will have a volume reduction of 30% by week 4 Date Initiated: 08/15/2019 Target Resolution Date: 09/15/2019 Goal Status: Active Interventions: Assess patient/caregiver ability to obtain necessary supplies Treatment Activities: Referred to DME Dori Devino for dressing supplies : 08/15/2019 Notes: Electronic Signature(s) Signed: 08/16/2019 9:48:50 AM By: Gretta Cool, BSN, RN, CWS, Kim RN, BSN Entered By: Gretta Cool, BSN, RN, CWS,  Kim on 08/15/2019 13:48:34 Audrey Garrett ([id number]) -------------------------------------------------------------------------------- Pain Assessment Details Patient Name: Audrey Garrett Date of Service: 08/15/2019 1:00 PM Medical Record Number: [id number] Patient Account Number: [id number] Date of Birth/Sex: Jul 01, 1960 (59 y.o. F) Treating RN: Cornell Barman Primary Care Brodie Correll: Pernell Dupre Other Clinician: Referring Donata Reddick: [place] Cellar Treating Jamiel Goncalves/Extender: Tito Dine in Treatment: 0 Active Problems Location of Pain Severity and Description of Pain Patient Has Paino No Site Locations Pain Management and Medication Current Pain Management: Electronic Signature(s) Signed: 08/15/2019 4:33:32 PM By: Paulla Fore, RRT, CHT Signed: 08/16/2019 9:48:50 AM By: Gretta Cool, BSN, RN, CWS, Kim RN, BSN Entered By: Lorine Bears on 08/15/2019 13:05:26 Audrey Garrett ([id number]) -------------------------------------------------------------------------------- Patient/Caregiver Education Details Patient Name: Audrey Garrett Date of Service: 08/15/2019 1:00 PM Medical Record Number: [id number] Patient Account Number: [id number] Date of Birth/Gender: 30-Aug-1960 (59 y.o. F) Treating RN: Cornell Barman Primary Care Physician: Pernell Dupre Other Clinician: Referring Physician: [place] Cellar Treating Physician/Extender: Tito Dine in Treatment: 0 Education Assessment Education Provided To: Patient Education Topics Provided Welcome To [facility]: Handouts: Welcome To [facility] Methods:  Demonstration, Explain/Verbal Responses: State content correctly Wound Debridement: Handouts: Wound Debridement Methods: Demonstration, Explain/Verbal Responses: State content correctly Wound/Skin Impairment: Handouts: Caring for Your Ulcer Methods: Demonstration, Explain/Verbal Responses: State content  correctly Electronic Signature(s) Signed: 08/16/2019 9:48:50 AM By: Gretta Cool, BSN, RN, CWS, Kim RN, BSN Entered By: Gretta Cool, BSN, RN, CWS, Kim on 08/15/2019 13:53:17 Audrey Garrett ([id number]) -------------------------------------------------------------------------------- Wound Assessment Details Patient Name: Audrey Garrett Date of Service: 08/15/2019 1:00 PM Medical Record Number: [id number] Patient Account Number: [id number] Date of Birth/Sex: [birth year] (59 y.o. F) Treating RN: Montey Hora Primary Care Merrick Feutz: Pernell Dupre Other Clinician: Referring Keiley Levey: [place] Cellar Treating [place] Corado/Extender: Tito Dine in Treatment: 0 Wound Status Wound Number: 4 Primary Diabetic Wound/Ulcer of the Lower Extremity Etiology: Wound Location: Left, Medial Ankle Secondary Pressure Ulcer Wounding Event: Gradually Appeared Etiology: Date Acquired: 05/28/2019 Wound Status: Open Weeks Of Treatment: 0 Comorbid Chronic Obstructive Pulmonary Disease (COPD), Clustered Wound: No History: Cirrhosis , Type II Diabetes, Received Radiation Pending Amputation On Presentation Photos Wound Measurements Length: (cm) 0.3 Width: (cm) 0.4 Depth: (cm) 0.1 Area: (cm) 0.094 Volume: (cm) 0.009 % Reduction in Area: % Reduction in Volume: Epithelialization: None Tunneling: No Undermining: No Wound Description Classification: Grade 1 Wound Margin: Flat and Intact Exudate Amount: Medium Exudate Type: Serous Exudate Color: amber Foul Odor After Cleansing: No Slough/Fibrino Yes Wound Bed Granulation Amount: Large (67-100%) Exposed Structure Granulation Quality: Pink Fascia Exposed: No Necrotic Amount: Small (1-33%) Fat Layer (Subcutaneous Tissue) Exposed: Yes Necrotic Quality: Adherent Slough Tendon Exposed: No Muscle Exposed: No Joint Exposed: No Bone Exposed: No Electronic Signature(s) Signed: 08/15/2019 4:52:06 PM By: Montey Hora Entered By: Montey Hora on 08/15/2019  13:23:40 Audrey Garrett ([id number]) -------------------------------------------------------------------------------- [place] Details Patient Name: Audrey Garrett Date of Service: 08/15/2019 1:00 PM Medical Record Number: [id number] Patient Account Number: [id number] Date of Birth/Sex: [birth year] (59 y.o. F) Treating RN: Cornell Barman Primary Care Marca Gadsby: Pernell Dupre Other Clinician: Referring Carlina Derks: [place] Cellar Treating Kelci Petrella/Extender: Tito Dine in Treatment: 0 Vital Signs Time Taken: 13:00 Temperature (F): 98.6 Height (in): 70 Pulse (bpm): 88 Source: Stated Respiratory Rate (breaths/min): 16 Weight (lbs): 208 Blood Pressure (mmHg): 114/70 Source: Stated Reference Range: 80 - 120 mg / dl Body Mass Index (BMI): 29.8 Electronic Signature(s) Signed: 08/15/2019 4:33:32 PM By: Lorine Bears RCP, RRT, CHT Entered By: Becky Sax, Amado Nash on 08/15/2019 13:08:43

## 2019-08-16 NOTE — Progress Notes (Signed)
JULEA, HUTTO ([id number]) Visit Report for 08/15/2019 Debridement Details Patient Name: Audrey Garrett, Audrey Garrett. Date of Service: 08/15/2019 1:00 PM Medical Record Number: [id number] Patient Account Number: [id number] Date of Birth/Sex: 1960-04-15 (59 y.o. F) Treating RN: Cornell Barman Primary Care Provider: Pernell Dupre Other Clinician: Referring Provider: [place] Cellar Treating Provider/Extender: Tito Dine in Treatment: 0 Debridement Performed for Wound #4 Left,Medial Foot Assessment: Performed By: Physician Ricard Dillon, MD Debridement Type: Debridement Severity of Tissue Pre Debridement: Fat layer exposed Level of Consciousness (Pre- Awake and Alert procedure): Pre-procedure Verification/Time Out Yes - 13:45 Taken: Pain Control: Lidocaine Total Area Debrided (L x W): 0.3 (cm) x 0.4 (cm) = 0.12 (cm) Tissue and other material Viable, Non-Viable, Eschar, Skin: Dermis debrided: Level: Skin/Dermis Debridement Description: Selective/Open Wound Instrument: Curette Bleeding: Minimum Hemostasis Achieved: Pressure Response to Treatment: Procedure was tolerated well Level of Consciousness (Post- Awake and Alert procedure): Post Debridement Measurements of Total Wound Length: (cm) 0.3 Width: (cm) 0.4 Depth: (cm) 0.1 Volume: (cm) 0.009 Character of Wound/Ulcer Post Debridement: Stable Severity of Tissue Post Debridement: Fat layer exposed Post Procedure Diagnosis Same as Pre-procedure Electronic Signature(s) Signed: 08/15/2019 5:06:12 PM By: Linton Ham MD Signed: 08/16/2019 9:48:50 AM By: Gretta Cool, BSN, RN, CWS, Kim RN, BSN Entered By: Linton Ham on 08/15/2019 14:22:37 Audrey Garrett ([id number]) -------------------------------------------------------------------------------- HPI Details Patient Name: Audrey Garrett Date of Service: 08/15/2019 1:00 PM Medical Record Number: [id number] Patient Account Number: [id number] Date of Birth/Sex: [birth year] (59  y.o. F) Treating RN: Cornell Barman Primary Care Provider: Pernell Dupre Other Clinician: Referring Provider: [place] Cellar Treating Provider/Extender: Tito Dine in Treatment: 0 History of Present Illness HPI Description: 05/01/19 upon evaluation today patient presents for initial inspection here in our clinic as a result of having issues with Charcot foot bilaterally. She is actually a patient at the [facility] orthopedic clinic in [facility]. She has been seeing Dr.  Cellar and she did undergo surgery for the Charcot foot on the right recently. This is still being managed by Dr. Andree Elk at this point. Fortunately she seems to be healing well however. Unfortunately she also has the wound on the medial aspect of her left foot right in the arch word has collapsed. This is been an issue for her and unfortunately just with the way she was walking there apparently was keeping things propagated as far as my conversation with the provider at [facility] would be to cause concerned they mainly put the past on in order to help with patient compliance with appropriate offloading. Nonetheless I do believe unfortunately the cast has become somewhat tight on the distal portion of the patient's foot this is actually cutting into her foot causing a secondary wound different from the original wound. Her toes are also somewhat discolored reddish in color not cyanotic but nonetheless I feel like the swelling and edema in the distal portion of her foot is an issue at this point. With that being said I do think the patient could be a candidate for total contact cast although I do believe we need to know one get the current cast-off and number two ensure that her toes are okay and there's no signs of anything worsening before we look at putting in the total contact cast. Nonetheless I think this would be appropriate for offloading and in fact probably the best way to go because she can actually  walk with the walking boot on in the total contact cast which will allow her  to continue to function daily without causing additional injury and harm to her foot. The patient does have a history of diabetes, bilateral Charcot foot for which she's had surgery on the right and is pending surgery on the left wants the right hills enough. Her most recent hemoglobin A-1 C was 6.7 on 01/24/19. In the course of today's treatment I did actually speak with Tam who is the physician assistant that works with Dr. Enoch Cellar. That will be detailed in the plan. 05/08/2019 on evaluation today patient actually appears to be doing exceedingly better compared to last week. She did end up going back to see Tam at [facility] they did remove the cast and actually placed on antibiotic though the patient is unsure what the antibiotic was. Subsequently she overall seems to be doing significantly better as far as her wounds are concerned the original wound for which she was sent to [place] is actually healed the other wounds which were caused by the cast and trauma actually also seem to be doing better in general I am very pleased with how the patient has progressed in the past week. She does have a cam walker boot which actually seems to be doing quite well for her on the left as well this pretty much matches the right side mood she had post surgery. Either way I am extremely pleased and I do not even think we can need to proceed with a total contact cast as of today I think what she has currently is probably can be sufficient considering the improvement she is made in just 1 week's time. READMISSION 08/15/2019 This is a patient that was seen by Jeri Cos on 2 visits in February. She had at that time a wound on the left medial foot and left great toe. She left in a nonhealed state and was followed by podiatry at [facility] Dr. Andree Elk. She returns today after having a new wound develop on the left medial foot proximal to the  original wound from 3 months ago. The patient has not really sure how long this has been here. She wears a [place] boot on her left ankle to support the ankle when she tries to walk. She has a cam boot on the right side. The patient has type 2 diabetes with severe Charcot deformity bilaterally. She had the right ankle fused however apparently there is further ongoing distraction in this area and therefore they are not going to do the left leg/left ankle. 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 ABIs in our clinic were 1.11 on the right and 1.02 on the left Electronic Signature(s) Signed: 08/15/2019 5:06:12 PM By: Linton Ham MD Entered By: Linton Ham on 08/15/2019 14:25:24 Audrey Garrett ([id number]) -------------------------------------------------------------------------------- Physical Exam Details Patient Name: Audrey Garrett Date of Service: 08/15/2019 1:00 PM Medical Record Number: [id number] Patient Account Number: [id number] Date of Birth/Sex: 1960-07-10 (59 y.o. F) Treating RN: Cornell Barman Primary Care Provider: Pernell Dupre Other Clinician: Referring Provider: [place] Cellar Treating Provider/Extender: Tito Dine in Treatment: 0 Constitutional Sitting or standing Blood Pressure is within target range for patient.. Pulse regular and within target range for patient.Marland Kitchen Respirations regular, non- labored and within target range.. Temperature is normal and within the target range for the patient.Marland Kitchen appears in no distress. Respiratory Respiratory effort is easy and symmetric  bilaterally. Rate is normal at rest and on room air.. Cardiovascular Popliteal and femoral pulses also palpable. Pedal pulses are easily palpable. Musculoskeletal Valgus deformity at the left  ankle. Integumentary (Hair, Skin) There is no erythema around the wound. Notes Wound exam; again a small open area on the medial left foot. This is proximal to the wound she had when she was here last time. Very dry surface with flaking circumference. I used a #3 curette to remove skin callus from around the circumference of the wound. This also slough from the wound surface. Although this cleaned up quite nicely. There was no bleeding. Electronic Signature(s) Signed: 08/15/2019 5:06:12 PM By: Linton Ham MD Entered By: Linton Ham on 08/15/2019 14:27:27 Audrey Garrett ([id number]) -------------------------------------------------------------------------------- Physician Orders Details Patient Name: Audrey Garrett Date of Service: 08/15/2019 1:00 PM Medical Record Number: [id number] Patient Account Number: [id number] Date of Birth/Sex: 24-Jan-1961 (59 y.o. F) Treating RN: Cornell Barman Primary Care Provider: Pernell Dupre Other Clinician: Referring Provider: [place] Cellar Treating Provider/Extender: Tito Dine in Treatment: 0 Verbal / Phone Orders: No Diagnosis Coding ICD-10 Coding Code Description E11.622 Type 2 diabetes mellitus with other skin ulcer L97.322 Non-pressure chronic ulcer of left ankle with fat layer exposed E11.42 Type 2 diabetes mellitus with diabetic polyneuropathy M14.672 Charcot's joint, left ankle and foot Wound Cleansing Wound #4 Left,Medial Foot o Clean wound with Normal Saline. Anesthetic (add to Medication List) Wound #4 Left,Medial Foot o Topical Lidocaine 4% cream applied to wound bed prior to debridement (In Clinic Only). Primary Wound Dressing Wound #4 Left,Medial Foot o Silver Collagen - with hydrogel Secondary Dressing Wound #4 Left,Medial Foot o Dry Gauze Dressing Change Frequency Wound #4 Left,Medial Foot o Change dressing every week - Call if wrap slips down. Follow-up Appointments Wound #4 Left,Medial  Foot o Return Appointment in 1 week. Edema Control Wound #4 Left,Medial Foot o 3 Layer Compression System - Left Lower Extremity o Elevate legs to the level of the heart and pump ankles as often as possible Off-Loading Wound #4 Left,Medial Foot o Other: - Electrical engineer) Signed: 08/15/2019 5:06:12 PM By: Linton Ham MD Signed: 08/16/2019 9:48:50 AM By: Gretta Cool, BSN, RN, CWS, Kim RN, BSN Entered By: Gretta Cool, BSN, RN, CWS, Kim on 08/15/2019 13:55:05 Audrey Garrett ([id number]) -------------------------------------------------------------------------------- Problem List Details Patient Name: PEYTYN, TRINE Date of Service: 08/15/2019 1:00 PM Medical Record Number: [id number] Patient Account Number: [id number] Date of Birth/Sex: Jul 08, 1960 (59 y.o. F) Treating RN: Cornell Barman Primary Care Provider: Pernell Dupre Other Clinician: Referring Provider: [place] Cellar Treating Provider/Extender: Ricard Dillon Weeks in Treatment: 0 Active Problems ICD-10 Encounter Code Description Active Date MDM Diagnosis E11.622 Type 2 diabetes mellitus with other skin ulcer 08/15/2019 No Yes L97.322 Non-pressure chronic ulcer of left ankle with fat layer exposed 08/15/2019 No Yes E11.42 Type 2 diabetes mellitus with diabetic polyneuropathy 08/15/2019 No Yes M14.672 Charcot's joint, left ankle and foot 08/15/2019 No Yes Inactive Problems Resolved Problems Electronic Signature(s) Signed: 08/15/2019 5:06:12 PM By: Linton Ham MD Entered By: Linton Ham on 08/15/2019 13:43:18 Audrey Garrett ([id number]) -------------------------------------------------------------------------------- Progress Note Details Patient Name: Audrey Garrett Date of Service: 08/15/2019 1:00 PM Medical Record Number: [id number] Patient Account Number: [id number] Date of Birth/Sex: 02/08/1961 (59 y.o. F) Treating RN: Cornell Barman Primary Care Provider: Pernell Dupre Other Clinician: Referring  Provider: [place] Cellar Treating Provider/Extender: Tito Dine in Treatment: 0 Subjective History of Present Illness (HPI) 05/01/19 upon evaluation today patient presents for initial inspection here  in our clinic as a result of having issues with Charcot foot bilaterally. She is actually a patient at the [facility] orthopedic clinic in [facility]. She has been seeing Dr. [place] Cellar and she did undergo surgery for the Charcot foot on the right recently. This is still being managed by Dr. Andree Elk at this point. Fortunately she seems to be healing well however. Unfortunately she also has the wound on the medial aspect of her left foot right in the arch word has collapsed. This is been an issue for her and unfortunately just with the way she was walking there apparently was keeping things propagated as far as my conversation with the provider at [facility] would be to cause concerned they mainly put the past on in order to help with patient compliance with appropriate offloading. Nonetheless I do believe unfortunately the cast has become somewhat tight on the distal portion of the patient's foot this is actually cutting into her foot causing a secondary wound different from the original wound. Her toes are also somewhat discolored reddish in color not cyanotic but nonetheless I feel like the swelling and edema in the distal portion of her foot is an issue at this point. With that being said I do think the patient could be a candidate for total contact cast although I do believe we need to know one get the current cast-off and number two ensure that her toes are okay and there's no signs of anything worsening before we look at putting in the total contact cast. Nonetheless I think this would be appropriate for offloading and in fact probably the best way to go because she can actually walk with the walking boot on in the total contact cast which will allow her to continue to function  daily without causing additional injury and harm to her foot. The patient does have a history of diabetes, bilateral Charcot foot for which she's had surgery on the right and is pending surgery on the left wants the right hills enough. Her most recent hemoglobin A-1 C was 6.7 on 01/24/19. In the course of today's treatment I did actually speak with Tam who is the physician assistant that works with Dr. [place] Cellar. That will be detailed in the plan. 05/08/2019 on evaluation today patient actually appears to be doing exceedingly better compared to last week. She did end up going back to see Tam at [place] they did remove the cast and actually placed on antibiotic though the patient is unsure what the antibiotic was. Subsequently she overall seems to be doing significantly better as far as her wounds are concerned the original wound for which she was sent to [place] is actually healed the other wounds which were caused by the cast and trauma actually also seem to be doing better in general I am very pleased with how the patient has progressed in the past week. She does have a cam walker boot which actually seems to be doing quite well for her on the left as well this pretty much matches the right side mood she had post surgery. Either way I am extremely pleased and I do not even think we can need to proceed with a total contact cast as of today I think what she has currently is probably can be sufficient considering the improvement she is made in just 1 week's time. READMISSION 08/15/2019 This is a patient that was seen by Jeri Cos on 2 visits in February. She had at that  time a wound on the left medial foot and left great toe. She left in a nonhealed state and was followed by podiatry at [facility] Dr. Andree Elk. She returns today after having a new wound develop on the left medial foot proximal to the original wound from 3 months ago. The patient has not really sure how long this has been here. She wears a  [place] boot on her left ankle to support the ankle when she tries to walk. She has a cam boot on the right side. The patient has type 2 diabetes with severe Charcot deformity bilaterally. She had the right ankle fused however apparently there is further ongoing distraction in this area and therefore they are not going to do the left leg/left ankle. 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 ABIs in our clinic were 1.11 on the right and 1.02 on the left Patient History Information obtained from Patient. Allergies No Known Allergies Family History Cancer - Siblings, Diabetes - Father, Hypertension - Mother, Stroke - Mother, No family history of Heart Disease, Hereditary Spherocytosis, Kidney Disease, Lung Disease, Seizures, Thyroid Problems, Tuberculosis. Social History Current every day smoker - 45 years, Marital Status - Married, Alcohol Use - Daily, Drug Use - Prior History, Caffeine Use - Never. Medical History Eyes Denies history of Cataracts, Glaucoma, Optic Neuritis Ear/Nose/Mouth/Throat Denies history of Chronic sinus problems/congestion, Middle ear problems Hematologic/Lymphatic Denies history of Anemia, Hemophilia, Human Immunodeficiency Virus, Lymphedema, Sickle Cell Disease Respiratory Patient has history of Chronic Obstructive Pulmonary Disease (COPD) Audrey Garrett, Audrey Garrett ([id number]) Denies history of Aspiration, Asthma, Pneumothorax, Sleep Apnea, Tuberculosis Cardiovascular Denies history of Angina, Arrhythmia, Congestive Heart Failure, Coronary Artery Disease, Deep Vein Thrombosis, Hypertension, Hypotension, Myocardial Infarction, Peripheral Arterial Disease, Peripheral Venous Disease, Phlebitis, Vasculitis Gastrointestinal Patient has history of Cirrhosis Denies history of  Colitis, Crohn s, Hepatitis A, Hepatitis B, Hepatitis C Endocrine Patient has history of Type II Diabetes Genitourinary Denies history of End Stage Renal Disease Immunological Denies history of Lupus Erythematosus, Raynaud s, Scleroderma Integumentary (Skin) Denies history of History of Burn, History of pressure wounds Musculoskeletal Denies history of Gout, Rheumatoid Arthritis, Osteoarthritis, Osteomyelitis Neurologic Denies history of Dementia, Neuropathy, Quadriplegia, Paraplegia, Seizure Disorder Oncologic Patient has history of Received Radiation Denies history of Received Chemotherapy Psychiatric Denies history of Anorexia/bulimia, Confinement Anxiety Hospitalization/Surgery History - foot surgery at [facility] 2020. Objective Constitutional Sitting or standing Blood Pressure is within target range for patient.. Pulse regular and within target range for patient.Marland Kitchen Respirations regular, non- labored and within target range.. Temperature is normal and within the target range for the patient.Marland Kitchen appears in no distress. Vitals Time Taken: 1:00 PM, Height: 70 in, Source: Stated, Weight: 208 lbs, Source: Stated, BMI: 29.8, Temperature: 98.6 F, Pulse: 88 bpm, Respiratory Rate: 16 breaths/min, Blood Pressure: 114/70 mmHg. Respiratory Respiratory effort is easy and symmetric bilaterally. Rate is normal at rest and on room air.. Cardiovascular Popliteal and femoral pulses also palpable. Pedal pulses are easily palpable. Musculoskeletal Valgus deformity at the left ankle. General Notes: Wound exam; again a small open area on the medial left foot. This is proximal to the wound she had when she was here last time. Very dry surface with flaking circumference. I used a #3 curette to remove skin callus from around the circumference of the wound. This also slough from the wound surface. Although this cleaned  up quite nicely. There was no bleeding. Integumentary (Hair, Skin) There is no erythema  around the wound. Wound #4 status is Open. Original cause of wound was Gradually Appeared. The wound is located on the Left,Medial Foot. The wound measures 0.3cm length x 0.4cm width x 0.1cm depth; 0.094cm^2 area and 0.009cm^3 volume. There is Fat Layer (Subcutaneous Tissue) Exposed exposed. There is no tunneling or undermining noted. There is a medium amount of serous drainage noted. The wound margin is flat and intact. There is large (67-100%) pink granulation within the wound bed. There is a small (1-33%) amount of necrotic tissue within the wound bed including Adherent Slough. Assessment Active Problems Audrey Garrett, Audrey Garrett ([id number]) ICD-10 Type 2 diabetes mellitus with other skin ulcer Non-pressure chronic ulcer of left ankle with fat layer exposed Type 2 diabetes mellitus with diabetic polyneuropathy Charcot's joint, left ankle and foot Procedures Wound #4 Pre-procedure diagnosis of Wound #4 is a Diabetic Wound/Ulcer of the Lower Extremity located on the Left,Medial Foot .Severity of Tissue Pre Debridement is: Fat layer exposed. There was a Selective/Open Wound Skin/Dermis Debridement with a total area of 0.12 sq cm performed by Ricard Dillon, MD. With the following instrument(s): Curette to remove Viable and Non-Viable tissue/material. Material removed includes Eschar and Skin: Dermis and after achieving pain control using Lidocaine. No specimens were taken. A time out was conducted at 13:45, prior to the start of the procedure. A Minimum amount of bleeding was controlled with Pressure. The procedure was tolerated well. Post Debridement Measurements: 0.3cm length x 0.4cm width x 0.1cm depth; 0.009cm^3 volume. Character of Wound/Ulcer Post Debridement is stable. Severity of Tissue Post Debridement is: Fat layer exposed. Post procedure Diagnosis Wound #4: Same as Pre-Procedure Plan Wound Cleansing: Wound #4 Left,Medial Foot: Clean wound with Normal Saline. Anesthetic (add to  Medication List): Wound #4 Left,Medial Foot: Topical Lidocaine 4% cream applied to wound bed prior to debridement (In Clinic Only). Primary Wound Dressing: Wound #4 Left,Medial Foot: Silver Collagen - with hydrogel Secondary Dressing: Wound #4 Left,Medial Foot: Dry Gauze Dressing Change Frequency: Wound #4 Left,Medial Foot: Change dressing every week - Call if wrap slips down. Follow-up Appointments: Wound #4 Left,Medial Foot: Return Appointment in 1 week. Edema Control: Wound #4 Left,Medial Foot: 3 Layer Compression System - Left Lower Extremity Elevate legs to the level of the heart and pump ankles as often as possible Off-Loading: Wound #4 Left,Medial Foot: Other: - [place] Boot 1. Recurrent wound on the left medial foot. This is more proximal than the original wound that she was here with in February. 2. The problem here is the instability of her left ankle which basically everts when she puts any pressure on it. She is using an [place] brace to try to hold this intact but I am not really sure that that is any good watching her walk. She is supposed to go back to the orthotic person just to adjust this brace but once again I think this deformity is such that not sure this type of brace will help. In fact it may be rubbing the wound sufficiently to cause open areas. It is also possible that without it this would be a weightbearing surface 3. Post debridement we change the dressing to silver collagen from silver alginate. Moistened with hydrogel 4. I elected to put her in 3 layer compression hopeful to get some edema away from around the wound and to support things in general in this area and prevent friction on the brace Electronic Signature(s) Signed:  08/15/2019 5:06:12 PM By: Linton Ham MD Entered By: Linton Ham on 08/15/2019 14:33:24 Audrey Garrett ([id number]) -------------------------------------------------------------------------------- ROS/PFSH Details Patient  Name: Audrey Garrett Date of Service: 08/15/2019 1:00 PM Medical Record Number: [id number] Patient Account Number: [id number] Date of Birth/Sex: [birth year] (59 y.o. F) Treating RN: Montey Hora Primary Care Provider: Pernell Dupre Other Clinician: Referring Provider: [place] Cellar Treating Provider/Extender: Tito Dine in Treatment: 0 Information Obtained From Patient Eyes Medical History: Negative for: Cataracts; Glaucoma; Optic Neuritis Ear/Nose/Mouth/Throat Medical History: Negative for: Chronic sinus problems/congestion; Middle ear problems Hematologic/Lymphatic Medical History: Negative for: Anemia; Hemophilia; Human Immunodeficiency Virus; Lymphedema; Sickle Cell Disease Respiratory Medical History: Positive for: Chronic Obstructive Pulmonary Disease (COPD) Negative for: Aspiration; Asthma; Pneumothorax; Sleep Apnea; Tuberculosis Cardiovascular Medical History: Negative for: Angina; Arrhythmia; Congestive Heart Failure; Coronary Artery Disease; Deep Vein Thrombosis; Hypertension; Hypotension; Myocardial Infarction; Peripheral Arterial Disease; Peripheral Venous Disease; Phlebitis; Vasculitis Gastrointestinal Medical History: Positive for: Cirrhosis Negative for: Colitis; Crohnos; Hepatitis A; Hepatitis B; Hepatitis C Endocrine Medical History: Positive for: Type II Diabetes Time with diabetes: 5 years Treated with: Insulin, Oral agents Blood sugar tested every day: Yes Tested : Genitourinary Medical History: Negative for: End Stage Renal Disease Immunological Medical History: Negative for: Lupus Erythematosus; Raynaudos; Scleroderma Integumentary (Skin) Medical History: Negative for: History of Burn; History of pressure wounds Audrey Garrett, Audrey Garrett ([id number]) Musculoskeletal Medical History: Negative for: Gout; Rheumatoid Arthritis; Osteoarthritis; Osteomyelitis Neurologic Medical History: Negative for: Dementia; Neuropathy; Quadriplegia;  Paraplegia; Seizure Disorder Oncologic Medical History: Positive for: Received Radiation Negative for: Received Chemotherapy Psychiatric Medical History: Negative for: Anorexia/bulimia; Confinement Anxiety Immunizations Pneumococcal Vaccine: Received Pneumococcal Vaccination: No Implantable Devices None Hospitalization / Surgery History Type of Hospitalization/Surgery foot surgery at [facility] 2020 Family and Social History Cancer: Yes - Siblings; Diabetes: Yes - Father; Heart Disease: No; Hereditary Spherocytosis: No; Hypertension: Yes - Mother; Kidney Disease: No; Lung Disease: No; Seizures: No; Stroke: Yes - Mother; Thyroid Problems: No; Tuberculosis: No; Current every day smoker - 45 years; Marital Status - Married; Alcohol Use: Daily; Drug Use: Prior History; Caffeine Use: Never; Financial Concerns: No; Food, Clothing or Shelter Needs: No; Support System Lacking: No; Transportation Concerns: No Electronic Signature(s) Signed: 08/15/2019 4:52:06 PM By: Montey Hora Signed: 08/15/2019 5:06:12 PM By: Linton Ham MD Entered By: Montey Hora on 08/15/2019 13:10:15 Audrey Garrett ([id number]) -------------------------------------------------------------------------------- [place] Details Patient Name: Audrey Garrett Date of Service: 08/15/2019 Medical Record Number: [id number] Patient Account Number: [id number] Date of Birth/Sex: 1961/03/02 (59 y.o. F) Treating RN: Cornell Barman Primary Care Provider: Pernell Dupre Other Clinician: Referring Provider: [place] Cellar Treating Provider/Extender: Tito Dine in Treatment: 0 Diagnosis Coding ICD-10 Codes Code Description E11.622 Type 2 diabetes mellitus with other skin ulcer L97.322 Non-pressure chronic ulcer of left ankle with fat layer exposed E11.42 Type 2 diabetes mellitus with diabetic polyneuropathy M14.672 Charcot's joint, left ankle and foot Facility Procedures CPT4 Code: 02409735 Description: 99213 -  WOUND CARE VISIT-LEV 3 EST PT Modifier: Quantity: 1 CPT4 Code: 32992426 Description: 83419 - DEBRIDE WOUND 1ST 20 SQ CM OR < Modifier: Quantity: 1 CPT4 Code: Description: ICD-10 Diagnosis Description L97.322 Non-pressure chronic ulcer of left ankle with fat layer exposed E11.622 Type 2 diabetes mellitus with other skin ulcer Modifier: Quantity: Physician Procedures CPT4 Code: 6222979 Description: 99214 - WC PHYS LEVEL 4 - EST PT Modifier: 25 Quantity: 1 CPT4 Code: Description: ICD-10 Diagnosis Description E11.622 Type 2 diabetes mellitus with other skin ulcer L97.322 Non-pressure chronic ulcer of left ankle with fat layer exposed  E11.42 Type 2 diabetes mellitus with diabetic polyneuropathy M14.672 Charcot's joint,  left ankle and foot Modifier: Quantity: CPT4 Code: 4199144 Description: 45848 - WC PHYS DEBR WO ANESTH 20 SQ CM Modifier: Quantity: 1 CPT4 Code: Description: ICD-10 Diagnosis Description L50.757 Non-pressure chronic ulcer of left ankle with fat layer exposed E11.622 Type 2 diabetes mellitus with other skin ulcer Modifier: Quantity: Electronic Signature(s) Signed: 08/15/2019 5:06:12 PM By: Linton Ham MD Entered By: Linton Ham on 08/15/2019 14:33:59

## 2019-08-22 ENCOUNTER — Encounter: Payer: Medicare Other | Admitting: Internal Medicine

## 2019-08-22 ENCOUNTER — Other Ambulatory Visit: Payer: Self-pay

## 2019-08-22 DIAGNOSIS — E11622 Type 2 diabetes mellitus with other skin ulcer: Secondary | ICD-10-CM | POA: Diagnosis not present

## 2019-08-23 NOTE — Progress Notes (Signed)
KALEA, PERINE ([id number]) Visit Report for 08/22/2019 HPI Details Patient Name: Audrey Garrett, Audrey Garrett. Date of Service: 08/22/2019 10:30 AM Medical Record Number: [id number] Patient Account Number: [id number] Date of Birth/Sex: 1960/11/20 (59 y.o. F) F) Treating RN: Cornell Barman Primary Care Provider: Pernell Dupre Other Clinician: Referring Provider: Pernell Dupre Treating Provider/Extender: Beverly Gust in Treatment: 1 History of Present Illness HPI Description: 05/01/19 upon evaluation today patient presents for initial inspection here in our clinic as a result of having issues with Charcot foot bilaterally. She is actually a patient at the [facility] orthopedic clinic in [place] She has been seeing Dr. [place] Cellar and she did undergo surgery for the Charcot foot on the right recently. This is still being managed by Dr. Andree Elk at this point. Fortunately she seems to be healing well however. Unfortunately she also has the wound on the medial aspect of her left foot right in the arch word has collapsed. This is been an issue for her and unfortunately just with the way she was walking there apparently was keeping things propagated as far as my conversation with the provider at [facility] would be to cause concerned they mainly put the past on in order to help with patient compliance with appropriate offloading. Nonetheless I do believe unfortunately the cast has become somewhat tight on the distal portion of the patient's foot this is actually cutting into her foot causing a secondary wound different from the original wound. Her toes are also somewhat discolored reddish in color not cyanotic but nonetheless I feel like the swelling and edema in the distal portion of her foot is an issue at this point. With that being said I do think the patient could be a candidate for total contact cast although I do believe we need to know one get the current cast-off and number two ensure that  her toes are okay and there's no signs of anything worsening before we look at putting in the total contact cast. Nonetheless I think this would be appropriate for offloading and in fact probably the best way to go because she can actually walk with the walking boot on in the total contact cast which will allow her to continue to function daily without causing additional injury and harm to her foot. The patient does have a history of diabetes, bilateral Charcot foot for which she's had surgery on the right and is pending surgery on the left wants the right hills enough. Her most recent hemoglobin A-1 C was 6.7 on 01/24/19. In the course of today's treatment I did actually speak with Tam who is the physician assistant that works with Dr.  Cellar. That will be detailed in the plan. 05/08/2019 on evaluation today patient actually appears to be doing exceedingly better compared to last week. She did end up going back to see Tam at [facility] [place] they did remove the cast and actually placed on antibiotic though the patient is unsure what the antibiotic was. Subsequently she overall seems to be doing significantly better as far as her wounds are concerned the original wound for which she was sent to [place] is actually healed the other wounds which were caused by the cast and trauma actually also seem to be doing better in general I am very pleased with how the patient has progressed in the past week. She does have a cam walker boot which actually seems to be doing quite well for her on the left as well this pretty much matches the  right side mood she had post surgery. Either way I am extremely pleased and I do not even think we can need to proceed with a total contact cast as of today I think what she has currently is probably can be sufficient considering the improvement she is made in just 1 week's time. READMISSION 08/15/2019 This is a patient that was seen by Jeri Cos on 2 visits in February. She had  at that time a wound on the left medial foot and left great toe. She left in a nonhealed state and was followed by podiatry at [facility] Dr. Andree Elk. She returns today after having a new wound develop on the left medial foot proximal to the original wound from 3 months ago. The patient has not really sure how long this has been here. She wears a [place] boot on her left ankle to support the ankle when she tries to walk. She has a cam boot on the right side. The patient has type 2 diabetes with severe Charcot deformity bilaterally. She had the right ankle fused however apparently there is further ongoing distraction in this area and therefore they are not going to do the left leg/left ankle. 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 ABIs in our clinic were 1.11 on the right and 1.02 on the left 08/22/19-Patient presents at 1 week, the left medial malleoli are wound has healed Electronic Signature(s) Signed: 08/22/2019 11:29:58 AM By: Tobi Bastos MD, MBA Entered By: Tobi Bastos on 08/22/2019 11:29:58 Audrey Garrett ([id number]) -------------------------------------------------------------------------------- Physical Exam Details Patient Name: Audrey Garrett Date of Service: 08/22/2019 10:30 AM Medical Record Number: [id number] Patient Account Number: [id number] Date of Birth/Sex: 09-14-1960 (59 y.o. F) Treating RN: Cornell Barman Primary Care Provider: Pernell Dupre Other Clinician: Referring Provider: Pernell Dupre Treating Provider/Extender: Beverly Gust in Treatment: 1 Constitutional alert and oriented x 3. sitting or standing blood pressure is within target range for patient.. supine blood pressure is within target range for patient.. pulse regular and within target range for  patient.Marland Kitchen respirations regular, non-labored and within target range for patient.Marland Kitchen temperature within target range for patient.. . . Well-nourished and well-hydrated in no acute distress. Notes The wound on the left medial malleolus covered by a thin layer of scab was removed yielding heel surface Electronic Signature(s) Signed: 08/22/2019 11:30:31 AM By: Tobi Bastos MD, MBA Entered By: Tobi Bastos on 08/22/2019 11:30:30 Audrey Garrett ([id number]) -------------------------------------------------------------------------------- Physician Orders Details Patient Name: Audrey Garrett. Date of Service: 08/22/2019 10:30 AM Medical Record Number: [id number] Patient Account Number: [id number] Date of Birth/Sex: 1960-11-10 (59 y.o. F) Treating RN: Cornell Barman Primary Care Provider: Pernell Dupre Other Clinician: Referring Provider: Pernell Dupre Treating Provider/Extender: Beverly Gust in Treatment: 1 Verbal / Phone Orders: No Diagnosis Coding Discharge From [facility] o Discharge from [place] for protection Electronic Signature(s) Signed: 08/22/2019 3:57:01 PM By: Tobi Bastos MD, MBA Signed: 08/22/2019 5:27:22 PM By: Gretta Cool, BSN, RN, CWS, Kim RN, BSN Entered By: Gretta Cool, BSN, RN, CWS, Kim on 08/22/2019 11:17:03 Audrey Garrett ([id number]) -------------------------------------------------------------------------------- Progress Note Details Patient Name: Audrey Garrett Date of Service: 08/22/2019 10:30 AM Medical Record Number: [id number] Patient Account Number: [id number] Date of Birth/Sex: 1960-12-22 (59 y.o. F) Treating RN: Cornell Barman Primary Care Provider: Pernell Dupre Other Clinician: Referring Provider: Pernell Dupre Treating Provider/Extender:  Kalaya Infantino, Odelia Gage Weeks in Treatment: 1 Subjective History of Present Illness (HPI) 05/01/19 upon evaluation today patient presents for initial inspection here in our clinic as a result of having  issues with Charcot foot bilaterally. She is actually a patient at the [facility] orthopedic clinic in [place]. She has been seeing Dr. Terry Cellar and she did undergo surgery for the Charcot foot on the right recently. This is still being managed by Dr. Andree Elk at this point. Fortunately she seems to be healing well however. Unfortunately she also has the wound on the medial aspect of her left foot right in the arch word has collapsed. This is been an issue for her and unfortunately just with the way she was walking there apparently was keeping things propagated as far as my conversation with the provider at [facility] would be to cause concerned they mainly put the past on in order to help with patient compliance with appropriate offloading. Nonetheless I do believe unfortunately the cast has become somewhat tight on the distal portion of the patient's foot this is actually cutting into her foot causing a secondary wound different from the original wound. Her toes are also somewhat discolored reddish in color not cyanotic but nonetheless I feel like the swelling and edema in the distal portion of her foot is an issue at this point. With that being said I do think the patient could be a candidate for total contact cast although I do believe we need to know one get the current cast-off and number two ensure that her toes are okay and there's no signs of anything worsening before we look at putting in the total contact cast. Nonetheless I think this would be appropriate for offloading and in fact probably the best way to go because she can actually walk with the walking boot on in the total contact cast which will allow her to continue to function daily without causing additional injury and harm to her foot. The patient does have a history of diabetes, bilateral Charcot foot for which she's had surgery on the right and is pending surgery on the left wants the right hills enough. Her most  recent hemoglobin A-1 C was 6.7 on 01/24/19. In the course of today's treatment I did actually speak with Tam who is the physician assistant that works with Dr. [place] Cellar. That will be detailed in the plan. 05/08/2019 on evaluation today patient actually appears to be doing exceedingly better compared to last week. She did end up going back to see Tam at [place] they did remove the cast and actually placed on antibiotic though the patient is unsure what the antibiotic was. Subsequently she overall seems to be doing significantly better as far as her wounds are concerned the original wound for which she was sent to [place] is actually healed the other wounds which were caused by the cast and trauma actually also seem to be doing better in general I am very pleased with how the patient has progressed in the past week. She does have a cam walker boot which actually seems to be doing quite well for her on the left as well this pretty much matches the right side mood she had post surgery. Either way I am extremely pleased and I do not even think we can need to proceed with a total contact cast as of today I think what she has currently is probably can be sufficient considering the improvement she is made in just 1  week's time. READMISSION 08/15/2019 This is a patient that was seen by Jeri Cos on 2 visits in February. She had at that time a wound on the left medial foot and left great toe. She left in a nonhealed state and was followed by podiatry at [facility] Dr. Andree Elk. She returns today after having a new wound develop on the left medial foot proximal to the original wound from 3 months ago. The patient has not really sure how long this has been here. She wears a [place] boot on her left ankle to support the ankle when she tries to walk. She has a cam boot on the right side. The patient has type 2 diabetes with severe Charcot deformity bilaterally. She had the right ankle fused however apparently there  is further ongoing distraction in this area and therefore they are not going to do the left leg/left ankle. 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 ABIs in our clinic were 1.11 on the right and 1.02 on the left 08/22/19-Patient presents at 1 week, the left medial malleoli are wound has healed Objective Constitutional alert and oriented x 3. sitting or standing blood pressure is within target range for patient.. supine blood pressure is within target range for patient.. pulse regular and within target range for patient.Marland Kitchen respirations regular, non-labored and within target range for patient.Marland Kitchen temperature within target range for patient.. Well-nourished and well-hydrated in no acute distress. Vitals Time Taken: 10:35 AM, Height: 70 in, Weight: 208 lbs, BMI: 29.8, Temperature: 98.6 F, Pulse: 85 bpm, Respiratory Rate: 16 breaths/min, Blood Pressure: 115/66 mmHg. CHANNA, HAZELETT ([id number]) General Notes: The wound on the left medial malleolus covered by a thin layer of scab was removed yielding heel surface Integumentary (Hair, Skin) Wound #4 status is Healed - Epithelialized. Original cause of wound was Gradually Appeared. The wound is located on the Left,Medial Foot. The wound measures 0cm length x 0cm width x 0cm depth; 0cm^2 area and 0cm^3 volume. There is a none present amount of drainage noted. The wound margin is flat and intact. There is no granulation within the wound bed. There is a large (67-100%) amount of necrotic tissue within the wound bed including Eschar. Plan Discharge From [facility]: Discharge from [place] for protection Wound has healed, will discharge form clinic Return as needed Electronic Signature(s) Signed: 08/22/2019 11:31:01 AM By: Tobi Bastos MD,  MBA Entered By: Tobi Bastos on 08/22/2019 11:31:01 Audrey Garrett ([id number]) -------------------------------------------------------------------------------- [place] Details Patient Name: Audrey Garrett Date of Service: 08/22/2019 Medical Record Number: [id number] Patient Account Number: [id number] Date of Birth/Sex: 05-18-1960 (59 y.o. F) Treating RN: Cornell Barman Primary Care Provider: Pernell Dupre Other Clinician: Referring Provider: Pernell Dupre Treating Provider/Extender: Beverly Gust in Treatment: 1 Diagnosis Coding ICD-10 Codes Code Description [phone] Type 2 diabetes mellitus with other skin ulcer L97.322 Non-pressure chronic ulcer of left ankle with fat layer exposed E11.42 Type 2 diabetes mellitus with diabetic polyneuropathy M14.672 Charcot's joint, left ankle and foot Facility Procedures CPT4 Code: [id number] Description: [phone] - WOUND CARE VISIT-LEV 2 EST PT Modifier: Quantity: 1 Physician Procedures CPT4 Code: 9678938 Description: 10175 - WC PHYS LEVEL 2 - EST PT Modifier: Quantity: 1 CPT4 Code: Description: ICD-10 Diagnosis Description E11.622 Type 2 diabetes mellitus with other skin ulcer Modifier: Quantity: Electronic Signature(s) Signed: 08/22/2019 11:31:17 AM By:  Tobi Bastos MD, MBA Entered By: Tobi Bastos on 08/22/2019 11:31:16

## 2019-08-23 NOTE — Progress Notes (Signed)
Audrey Garrett ([id number]) Visit Report for 08/22/2019 Arrival Information Details Patient Garrett: Audrey Garrett. Date of Service: 08/22/2019 10:30 AM Medical Record Number: [id number] Patient Account Number: [id number] Date of Birth/Sex: [birth year] (59 y.o. F) Treating RN: Army Melia Primary Care Felipe Cabell: Pernell Dupre Other Clinician: Referring Tzippy Testerman: Pernell Dupre Treating Hollynn Garno/Extender: Beverly Gust in Treatment: 1 Visit Information History Since Last Visit Added or deleted any medications: No Patient Arrived: Cane Any new allergies or adverse reactions: No Arrival Time: 10:35 Had a fall or experienced change in No Accompanied By: husband activities of daily living that may affect Transfer Assistance: None risk of falls: Patient Identification Verified: Yes Signs or symptoms of abuse/neglect since last visito No Hospitalized since last visit: No Has Dressing in Place as Prescribed: Yes Pain Present Now: No Electronic Signature(s) Signed: 08/22/2019 11:17:30 AM By: Army Melia Entered By: Army Melia on 08/22/2019 10:35:36 Audrey Garrett ([id number]) -------------------------------------------------------------------------------- Clinic Level of Care Assessment Details Patient Garrett: Audrey Garrett Date of Service: 08/22/2019 10:30 AM Medical Record Number: [id number] Patient Account Number: [id number] Date of Birth/Sex: 1960/06/02 (59 y.o. F) Treating RN: Cornell Barman Primary Care Kiki Bivens: Pernell Dupre Other Clinician: Referring Adonias Demore: Pernell Dupre Treating Leelyn Jasinski/Extender: Beverly Gust in Treatment: 1 Clinic Level of Care Assessment Items TOOL 4 Quantity Score []  - Use when only an EandM is performed on FOLLOW-UP visit 0 ASSESSMENTS - Nursing Assessment / Reassessment X - Reassessment of Co-morbidities (includes updates in patient status) 1 10 X- 1 5 Reassessment of Adherence to Treatment Plan ASSESSMENTS - Wound and Skin  Assessment / Reassessment X - Simple Wound Assessment / Reassessment - one wound 1 5 []  - 0 Complex Wound Assessment / Reassessment - multiple wounds []  - 0 Dermatologic / Skin Assessment (not related to wound area) ASSESSMENTS - Focused Assessment []  - Circumferential Edema Measurements - multi extremities 0 []  - 0 Nutritional Assessment / Counseling / Intervention []  - 0 Lower Extremity Assessment (monofilament, tuning fork, pulses) []  - 0 Peripheral Arterial Disease Assessment (using hand held doppler) ASSESSMENTS - Ostomy and/or Continence Assessment and Care []  - Incontinence Assessment and Management 0 []  - 0 Ostomy Care Assessment and Management (repouching, etc.) PROCESS - Coordination of Care X - Simple Patient / Family Education for ongoing care 1 15 []  - 0 Complex (extensive) Patient / Family Education for ongoing care []  - 0 Staff obtains Programmer, systems, Records, Test Results / Process Orders []  - 0 Staff telephones HHA, Nursing Homes / Clarify orders / etc []  - 0 Routine Transfer to another Facility (non-emergent condition) []  - 0 Routine Hospital Admission (non-emergent condition) []  - 0 New Admissions / Biomedical engineer / Ordering NPWT, Apligraf, etc. []  - 0 Emergency Hospital Admission (emergent condition) X- 1 10 Simple Discharge Coordination []  - 0 Complex (extensive) Discharge Coordination PROCESS - Special Needs []  - Pediatric / Minor Patient Management 0 []  - 0 Isolation Patient Management []  - 0 Hearing / Language / Visual special needs []  - 0 Assessment of Community assistance (transportation, D/C planning, etc.) []  - 0 Additional assistance / Altered mentation []  - 0 Support Surface(s) Assessment (bed, cushion, seat, etc.) INTERVENTIONS - Wound Cleansing / Measurement [name], [name] B. ([id number]) X- 1 5 Simple Wound Cleansing - one wound []  - 0 Complex Wound Cleansing - multiple wounds X- 1 5 Wound Imaging (photographs - any number of  wounds) []  - 0 Wound Tracing (instead of photographs) X- 1 5 Simple Wound Measurement - one wound []  - 0 Complex  Wound Measurement - multiple wounds INTERVENTIONS - Wound Dressings X - Small Wound Dressing one or multiple wounds 1 10 []  - 0 Medium Wound Dressing one or multiple wounds []  - 0 Large Wound Dressing one or multiple wounds []  - 0 Application of Medications - topical []  - 0 Application of Medications - injection INTERVENTIONS - Miscellaneous []  - External ear exam 0 []  - 0 Specimen Collection (cultures, biopsies, blood, body fluids, etc.) []  - 0 Specimen(s) / Culture(s) sent or taken to Lab for analysis []  - 0 Patient Transfer (multiple staff / Civil Service fast streamer / Similar devices) []  - 0 Simple Staple / Suture removal (25 or less) []  - 0 Complex Staple / Suture removal (26 or more) []  - 0 Hypo / Hyperglycemic Management (close monitor of Blood Glucose) []  - 0 Ankle / Brachial Index (ABI) - do not check if billed separately X- 1 5 Vital Signs Has the patient been seen at the hospital within the last three years: Yes Total Score: 75 Level Of Care: New/Established - Level 2 Electronic Signature(s) Signed: 08/22/2019 5:27:22 PM By: Gretta Cool, BSN, RN, CWS, Kim RN, BSN Entered By: Gretta Cool, BSN, RN, CWS, Kim on 08/22/2019 11:18:59 Audrey Garrett ([id number]) -------------------------------------------------------------------------------- Encounter Discharge Information Details Patient Garrett: Audrey Garrett Date of Service: 08/22/2019 10:30 AM Medical Record Number: [id number] Patient Account Number: [id number] Date of Birth/Sex: 22-Feb-1961 (59 y.o. F) Treating RN: Cornell Barman Primary Care Attallah Ontko: Pernell Dupre Other Clinician: Referring Shatisha Falter: Pernell Dupre Treating Teriah Muela/Extender: Beverly Gust in Treatment: 1 Encounter Discharge Information Items Discharge Condition: Stable Ambulatory Status: Ambulatory Discharge Destination: Home Transportation:  Private Auto Accompanied By: self Schedule Follow-up Appointment: No Clinical Summary of Care: Electronic Signature(s) Signed: 08/22/2019 5:27:22 PM By: Gretta Cool, BSN, RN, CWS, Kim RN, BSN Entered By: Gretta Cool, BSN, RN, CWS, Kim on 08/22/2019 11:19:58 Audrey Garrett ([id number]) -------------------------------------------------------------------------------- Lower Extremity Assessment Details Patient Garrett: Audrey Garrett Date of Service: 08/22/2019 10:30 AM Medical Record Number: [id number] Patient Account Number: [id number] Date of Birth/Sex: [birth year] (59 y.o. F) Treating RN: Army Melia Primary Care Darleene Cumpian: Pernell Dupre Other Clinician: Referring Jordynne Mccown: Pernell Dupre Treating Abbegail Matuska/Extender: Beverly Gust in Treatment: 1 Edema Assessment Assessed: [Left: No] [Right: No] Edema: [Left: N] [Right: o] Calf Left: Right: Point of Measurement: 33 cm From Medial Instep 35 cm cm Ankle Left: Right: Point of Measurement: 11 cm From Medial Instep 25 cm cm Vascular Assessment Pulses: Dorsalis Pedis Palpable: [Left:Yes] Electronic Signature(s) Signed: 08/22/2019 11:17:30 AM By: Army Melia Entered By: Army Melia on 08/22/2019 10:37:57 Audrey Garrett ([id number]) -------------------------------------------------------------------------------- Multi Wound Chart Details Patient Garrett: Audrey Garrett Date of Service: 08/22/2019 10:30 AM Medical Record Number: [id number] Patient Account Number: [id number] Date of Birth/Sex: [birth year] (59 y.o. 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 N/A N/A Comorbid History: Chronic Obstructive Pulmonary N/A N/A Disease (COPD), Cirrhosis , Type II Diabetes, Received Radiation Date Acquired: 05/28/2019 N/A N/A Weeks of Treatment: 1 N/A N/A Wound Status: Healed - Epithelialized N/A N/A Pending Amputation on Yes N/A N/A Presentation: Measurements L x W x D (cm) 0x0x0 N/A N/A Area (cm) : 0  N/A N/A Volume (cm) : 0 N/A N/A % Reduction in Area: 100.00% N/A N/A % Reduction in Volume: 100.00% N/A N/A Classification: Grade 1 N/A N/A Exudate Amount: None Present N/A N/A Wound Margin: Flat and Intact N/A N/A Granulation Amount: None Present (0%) N/A N/A Necrotic Amount: Large (67-100%) N/A N/A Necrotic Tissue: Eschar N/A N/A Exposed Structures: Fascia: No N/A N/A Fat Layer (Subcutaneous Tissue) Exposed: No Tendon: No Muscle: No Joint: No Bone: No Epithelialization: Large (67-100%) N/A N/A Treatment Notes Electronic Signature(s) Signed: 08/22/2019 5:27:22 PM By: Gretta Cool, BSN, RN, CWS, Kim RN, BSN Entered By: Gretta Cool, BSN, RN, CWS, Kim on 08/22/2019 11:16:21 Audrey Garrett ([id number]) -------------------------------------------------------------------------------- [place] Details Patient Garrett: Audrey Garrett Date of Service: 08/22/2019 10:30 AM Medical Record Number: [id number] Patient Account Number: [id number] Date of Birth/Sex: 09/14/1960 (59 y.o. F) Treating RN: Cornell Barman Primary Care Mazy Culton: Pernell Dupre Other Clinician: Referring Emonnie Cannady: Pernell Dupre Treating Hadlie Gipson/Extender: Beverly Gust in Treatment: 1 Active Inactive Electronic Signature(s) Signed: 08/22/2019 5:27:22 PM By: Gretta Cool, BSN, RN, CWS, Kim RN, BSN Entered By: Gretta Cool, BSN, RN, CWS, Kim on 08/22/2019 11:16:13 Audrey Garrett  ([id number]) -------------------------------------------------------------------------------- Pain Assessment Details Patient Garrett: Audrey Garrett Date of Service: 08/22/2019 10:30 AM Medical Record Number: [id number] Patient Account Number: [id number] Date of Birth/Sex: 07/23/1960 (59 y.o. F) Treating RN: Army Melia Primary Care Azarel Banner: Pernell Dupre Other Clinician: Referring Sanayah Munro: Pernell Dupre Treating Prisilla Kocsis/Extender: Beverly Gust in Treatment: 1 Active Problems Location of Pain Severity and Description of Pain Patient Has Paino No Site Locations Pain Management and Medication Current Pain Management: Electronic Signature(s) Signed: 08/22/2019 11:17:30 AM By: Army Melia Entered By: Army Melia on 08/22/2019 10:35:55 Audrey Garrett ([id number]) -------------------------------------------------------------------------------- Patient/Caregiver Education Details Patient Garrett: Audrey Garrett Date of Service: 08/22/2019 10:30 AM Medical Record Number: [id number] Patient Account Number: [id number] Date of Birth/Gender: Oct 28, 1960 (59 y.o. F) Treating RN: Cornell Barman Primary Care Physician: Pernell Dupre Other Clinician: Referring Physician: Pernell Dupre Treating Physician/Extender: Beverly Gust in Treatment: 1 Education Assessment Education Provided To: Patient Education Topics Provided Wound/Skin Impairment: Handouts: Other: cover for protection Methods: Demonstration Responses: State content correctly Electronic Signature(s) Signed: 08/22/2019 5:27:22 PM By: Gretta Cool, BSN, RN, CWS, Kim RN, BSN Entered By: Gretta Cool, BSN, RN, CWS, Kim on 08/22/2019 11:19:40 Audrey Garrett ([id number]) -------------------------------------------------------------------------------- Wound Assessment Details Patient Garrett: Audrey Garrett Date of Service: 08/22/2019 10:30 AM Medical Record Number: [id number] Patient Account Number: [id number] Date of  Birth/Sex: 28-Aug-1960 (59 y.o. F) Treating RN: Cornell Barman Primary Care Kellon Chalk: Pernell Dupre Other Clinician: Referring Adriella Essex: Pernell Dupre Treating Sukhdeep Wieting/Extender: Beverly Gust in Treatment: 1 Wound Status Wound Number: 4 Primary Diabetic Wound/Ulcer of the Lower Extremity Etiology: Wound Location: Left, Medial Foot Secondary Pressure Ulcer Wounding Event: Gradually Appeared Etiology: Date Acquired: 05/28/2019 Wound Status: Healed - Epithelialized Weeks Of Treatment: 1 Comorbid Chronic Obstructive Pulmonary Disease (COPD), Clustered Wound: No History: Cirrhosis , Type II Diabetes, Received Radiation Pending Amputation On Presentation Photos Wound Measurements Length: (cm) 0 Width: (cm) 0 Depth: (cm) 0 Area: (cm) 0 Volume: (cm) 0 % Reduction in Area: 100% % Reduction in Volume: 100% Epithelialization: Large (67-100%) Wound Description Classification: Grade 1 Wound Margin: Flat and Intact Exudate Amount: None Present Foul Odor After Cleansing: No Slough/Fibrino Yes Wound Bed Granulation Amount: None Present (0%) Exposed Structure Necrotic Amount: Large (67-100%) Fascia Exposed: No Necrotic Quality: Eschar Fat Layer (Subcutaneous Tissue) Exposed: No Tendon Exposed: No Muscle Exposed: No Joint Exposed: No Bone Exposed: No Electronic  Signature(s) Signed: 08/22/2019 5:27:22 PM By: Gretta Cool, BSN, RN, CWS, Kim RN, BSN Entered By: Gretta Cool, BSN, RN, CWS, Kim on 08/22/2019 11:15:58 Audrey Garrett ([id number]) -------------------------------------------------------------------------------- [place] Details Patient Garrett: Audrey Garrett Date of Service: 08/22/2019 10:30 AM Medical Record Number: [id number] Patient Account Number: [id number] Date of Birth/Sex: [birth year] (59 y.o. F) Treating RN: Army Melia Primary Care Geary Rufo: Pernell Dupre Other Clinician: Referring Lilyona Richner: Pernell Dupre Treating Alyssia Heese/Extender: Beverly Gust in  Treatment: 1 Vital Signs Time Taken: 10:35 Temperature (F): 98.6 Height (in): 70 Pulse (bpm): 85 Weight (lbs): 208 Respiratory Rate (breaths/min): 16 Body Mass Index (BMI): 29.8 Blood Pressure (mmHg): 115/66 Reference Range: 80 - 120 mg / dl Electronic Signature(s) Signed: 08/22/2019 11:17:30 AM By: Army Melia Entered By: Army Melia on 08/22/2019 10:35:48

## 2019-08-31 NOTE — Progress Notes (Signed)
Patient: Audrey Garrett  Service Category: E/M  Provider: Gaspar Cola, MD  DOB: 12-Apr-1960  DOS: 09/03/2019  Location: Office  MRN: [id number]  Setting: Ambulatory outpatient  Referring Provider: Jodi Marble, MD  Type: Established Patient  Specialty: Interventional Pain Management  PCP: Jodi Marble, MD  Location: Remote location  Delivery: TeleHealth     Virtual Encounter - Pain Management PROVIDER NOTE: Information contained herein reflects review and annotations entered in association with encounter. Interpretation of such information and data should be left to medically-trained personnel. Information provided to patient can be located elsewhere in the medical record under "Patient Instructions". Document created using STT-dictation technology, any transcriptional errors that may result from process are unintentional.    Contact & Pharmacy Preferred: [phone] Home: [phone] (home) Mobile: [phone] (mobile) E-mail: [email or web address]  [name]: [phone]: [phone]  Pre-screening  Ms. Hew offered "in-person" vs "virtual" encounter. She indicated preferring virtual for this encounter.   Reason COVID-19*  Social distancing based on CDC and AMA recommendations.   I contacted FCraige Cottaon 09/03/2019 via telephone.      I clearly identified myself as FGaspar Cola MD. I verified that I was speaking with the correct person using two identifiers (Name: FTRINITEY ROACHE and date of birth: 58-Jul-1962.  Consent I sought verbal advanced consent from FCraige Cottafor virtual visit interactions. I informed Ms. FEsquerof possible security and privacy concerns, risks, and limitations associated with providing "not-in-person" medical evaluation and management services. I also informed Ms. FCahoonof the availability of "in-person" appointments. Finally, I  informed her that there would be a charge for the virtual visit and that she could be  personally, fully or partially, financially responsible for it. Ms. FRibaudoexpressed understanding and agreed to proceed.   Historic Elements   Ms. FJENNAVIE MARTINEKis a 59y.o. year old, female patient evaluated today after her last contact with our practice on 03/07/2019. Ms. FNethery has a past medical history of Alcohol abuse, Alcoholic cirrhosis ([place], Anxiety, Chronic hepatitis C ([place], Chronic LBP (12/25/2014), Chronic pain syndrome, DDD (degenerative disc disease), lumbar, Diverticulosis, Emphysema lung ([place], Fibromyalgia, Full dentures, GERD (gastroesophageal reflux disease), Hiatal hernia, History of GI bleed, Hypertension, Iron deficiency anemia due to chronic blood loss, Lumbar facet joint syndrome, Lumbar spondylosis, Lung cancer ([place] (11/25/2016), MDD (major depressive disorder), Nephrolithiasis, OA (osteoarthritis of spine), Polyarthritis, Polyneuropathy, Restless leg syndrome, Right renal mass, RLS (restless legs syndrome), SOB (shortness of breath), Type 2 diabetes mellitus ([place], Urinary frequency, and Vitamin D deficiency disease. She also  has a past surgical history that includes Esophagogastroduodenoscopy (N/A, 04/14/2015); Esophagogastroduodenoscopy (egd) with propofol (N/A, 09/16/2015); Colonoscopy with propofol (N/A, 09/25/2015); Umbilical hernia repair (N/A, 11/06/2015); Esophagogastroduodenoscopy (egd) with propofol (N/A, 01/22/2016); Colonoscopy with propofol (N/A, 04/01/2016); Esophagogastroduodenoscopy (egd) with propofol (N/A, 04/01/2016); Electormagnetic navigation bronchoscopy (N/A, 11/25/2016); IR Radiologist Eval & Mgmt (04/13/2017); IR Radiologist Eval & Mgmt (11/15/2017); Spinal fusion (12/19/2011); Endometrial ablation (2007); Tubal ligation (Bilateral, yrs ago); Tonsillectomy (age 59; Radiology with anesthesia (Right, 12/14/2017); and Foot arthrodesis (Right, 04/06/2018). Ms. FMenchacahas a current  medication list which includes the following prescription(s): cyclobenzaprine, escitalopram, furosemide, linzess, metformin, multivitamin with minerals, ozempic (1 mg/dose), pantoprazole, pregabalin, rosuvastatin, spironolactone, tresiba flextouch, [START ON 09/09/2019] oxycodone hcl, [START ON 10/09/2019] oxycodone hcl, and [START ON 11/08/2019] oxycodone hcl. She  reports that she has been smoking cigarettes. She has a  22.50 pack-year smoking history. She has never used smokeless tobacco. She reports previous alcohol use. She reports that she does not use drugs. Ms. Seckel has No Known Allergies.   HPI  Today, she is being contacted for medication management. The patient indicates doing well with the current medication regimen. No adverse reactions or side effects reported to the medications.  The patient also wanted to update her records by reminding me that in the past year she has had 2 right foot surgeries for her Charcot right foot.  She indicates that upon following up with the surgeon, they told her that her hardware was collapsing and that there was nothing else that they could do.  In addition, she was pending to have a similar surgery on the left side, which apparently was canceled after that.  Pharmacotherapy Assessment  Analgesic: Oxycodone IR 20 mg, 1 tab PO q 6 hrs(80 mg/day of oxycodone) MME/day:120 mg/day.   Monitoring: [place] PMP: PDMP reviewed during this encounter.       Pharmacotherapy: No side-effects or adverse reactions reported. Compliance: No problems identified. Effectiveness: Clinically acceptable. Plan: Refer to "POC".  UDS:  Summary  Date Value Ref Range Status  12/01/2017 FINAL  Final    Comment:    ==================================================================== TOXASSURE SELECT 13 (MW) ==================================================================== Test                             Result       Flag       Units Drug Present and Declared for Prescription  Verification   Oxycodone                      1004         EXPECTED   ng/mg creat   Oxymorphone                    3423         EXPECTED   ng/mg creat   Noroxycodone                   6665         EXPECTED   ng/mg creat   Noroxymorphone                 3850         EXPECTED   ng/mg creat    Sources of oxycodone are scheduled prescription medications.    Oxymorphone, noroxycodone, and noroxymorphone are expected    metabolites of oxycodone. Oxymorphone is also available as a    scheduled prescription medication. ==================================================================== Test                      Result    Flag   Units      Ref Range   Creatinine              26               mg/dL      >=20 ==================================================================== Declared Medications:  The flagging and interpretation on this report are based on the  following declared medications.  Unexpected results may arise from  inaccuracies in the declared medications.  **Note: The testing scope of this panel includes these medications:  Oxycodone  **Note: The testing scope of this panel does not include following  reported medications:  Cyclobenzaprine  Escitalopram (Lexapro)  Furosemide (Lasix)  Lactulose  Metformin  Multivitamin  Pantoprazole (Protonix)  Pregabalin (Lyrica)  Spironolactone (Aldactone)  Vitamin B1 ==================================================================== For clinical consultation, please call [phone]. ====================================================================     Laboratory Chemistry Profile   Renal Lab Results  Component Value Date   BUN 8 07/04/2019   CREATININE 0.56 07/04/2019   GFRAA >60 07/04/2019   GFRNONAA >60 07/04/2019     Hepatic Lab Results  Component Value Date   AST 39 07/04/2019   ALT 20 07/04/2019   ALBUMIN 4.1 07/04/2019   ALKPHOS 93 07/04/2019   LIPASE 32 11/06/2015   AMMONIA 12 09/14/2016      Electrolytes Lab Results  Component Value Date   NA 132 (L) 07/04/2019   K 3.7 07/04/2019   CL 93 (L) 07/04/2019   CALCIUM 8.8 (L) 07/04/2019   MG 1.9 01/22/2016   PHOS 3.6 11/23/2013     Bone No results found for: VD25OH, VD125OH2TOT, DV7616WV3, XT0626RS8, 25OHVITD1, 25OHVITD2, 25OHVITD3, TESTOFREE, TESTOSTERONE   Inflammation (CRP: Acute Phase) (ESR: Chronic Phase) Lab Results  Component Value Date   CRP <0.5 04/03/2015   ESRSEDRATE 27 04/03/2015   LATICACIDVEN 2.7 ([place]) 04/23/2017       Note: Above Lab results reviewed.   Imaging  US PELVIC COMPLETE WITH TRANSVAGINAL CLINICAL DATA:  Adnexal cyst, follow-up  EXAM: TRANSABDOMINAL AND TRANSVAGINAL ULTRASOUND OF PELVIS  TECHNIQUE: Both transabdominal and transvaginal ultrasound examinations of the pelvis were performed. Transabdominal technique was performed for global imaging of the pelvis including uterus, ovaries, adnexal regions, and pelvic cul-de-sac. It was necessary to proceed with endovaginal exam following the transabdominal exam to visualize the uterus, endometrium and ovaries.  COMPARISON:  CT abdomen and pelvis 07/20/2019, prior pelvic ultrasound 10/30/2015  FINDINGS: Uterus  Measurements: 5.4 x 2.6 x 3.8 cm = volume: 27 mL. Poorly visualized on transabdominal imaging due to inadequate bladder distention and on transvaginal imaging due to position. Atrophic appearance without gross mass  Endometrium  Thickness: Poorly visualized, question 5 mm thick. No obvious endometrial fluid or mass  Right ovary  No normal appearing RIGHT ovary visualized, see below  Left ovary  Not visualized, likely obscured by bowel  Other findings  No free pelvic fluid. Large cyst identified within RIGHT adnexa, 7.2 x 5.2 x 6.1 cm (volume = 120 cm^3), measured 6.5 x 5.1 x 5.8 cm (volume = 100 cm^3) on prior CT.  IMPRESSION: Suboptimal visualization of uterus and endometrial complex with nonvisualization of  LEFT ovary.  Large cyst in RIGHT adnexa 7.2 cm in greatest diameter, increased in volume by 20% since the prior CT exam and complicated by scattered internal echoes.  Surgical evaluation recommended to exclude cystic ovarian neoplasm.  These results will be called to the ordering clinician or representative by the Radiologist Assistant, and communication documented in the PACS or [employer].  Electronically Signed   By: Lavonia Dana M.D.   On: 08/07/2019 17:37  Assessment  The primary encounter diagnosis was Chronic pain syndrome. Diagnoses of Cancer-related pain, Chronic low back pain (Primary Area of Pain) (Bilateral) (L>R), Chronic lower extremity pain (Secondary area of Pain) (Left), Failed back surgical syndrome, Lumbar facet syndrome (Bilateral) (L>R), Alcoholic cirrhosis of liver with ascites [facility]), Pharmacologic therapy, and Chronic musculoskeletal pain were also pertinent to this visit.  Plan of Care  Problem-specific:  No problem-specific Assessment & Plan notes found for this encounter.  Ms. MANILA ROMMEL has a current medication list which includes the following long-term medication(s): linzess, rosuvastatin, spironolactone, [START ON 09/09/2019] oxycodone hcl, [START ON 10/09/2019] oxycodone  hcl, and [START ON 11/08/2019] oxycodone hcl.  Pharmacotherapy (Medications Ordered): Meds ordered this encounter  Medications  . cyclobenzaprine (FLEXERIL) 5 MG tablet    Sig: Take 1 tablet (5 mg total) by mouth 3 (three) times daily as needed for muscle spasms.    Dispense:  90 tablet    Refill:  5    Fill one day early if pharmacy is closed on scheduled refill date. May substitute for generic if available.  . Oxycodone HCl 20 MG TABS    Sig: Take 1 tablet (20 mg total) by mouth every 6 (six) hours. Must last 30 days    Dispense:  120 tablet    Refill:  0    Chronic Pain: STOP Act (Not applicable) Fill 1 day early if closed on refill date. Do not fill until: 09/09/2019. To  last until: 10/09/2019. Avoid benzodiazepines within 8 hours of opioids  . Oxycodone HCl 20 MG TABS    Sig: Take 1 tablet (20 mg total) by mouth every 6 (six) hours. Must last 30 days    Dispense:  120 tablet    Refill:  0    Chronic Pain: STOP Act (Not applicable) Fill 1 day early if closed on refill date. Do not fill until: 10/09/2019. To last until: 11/08/2019. Avoid benzodiazepines within 8 hours of opioids  . Oxycodone HCl 20 MG TABS    Sig: Take 1 tablet (20 mg total) by mouth every 6 (six) hours. Must last 30 days    Dispense:  120 tablet    Refill:  0    Chronic Pain: STOP Act (Not applicable) Fill 1 day early if closed on refill date. Do not fill until: 11/08/2019. To last until: 12/08/2019. Avoid benzodiazepines within 8 hours of opioids   Orders:  Orders Placed This Encounter  Procedures  . ToxASSURE Select 13 (MW), Urine    Volume: 30 ml(s). Minimum 3 ml of urine is needed. Document temperature of fresh sample. Indications: Long term (current) use of opiate analgesic (D42.876)    Order Specific Question:   Release to patient    Answer:   Immediate   Follow-up plan:   Return in about 3 months (around 12/05/2019) for (F2F), (MM).      Interventional management options:  Considering:   NOTE: Hx of Thrombocytopenia  Diagnostic bilateral lumbar facetblock  Possible bilateral lumbar facet RFA Diagnostic left caudal ESI + diagnostic epidurogram Possible Racz procedure Diagnostic bilateral SIjoint injection  Possible bilateral SI joint RFA Diagnostic left CESI  Diagnostic bilateral cervical facetblock  Possible bilateral cervical facet RFA   Palliative PRN treatment(s):   Diagnostic bilateral lumbar facet block Diagnostic bilateral IA hipjoint injection Diagnostic bilateral SIjoint block    Recent Visits Date Type Provider Dept  06/11/19 Telemedicine [name], MD [facility]  Showing recent visits within past 90 days and meeting all other  requirements   Today's Visits Date Type Provider Dept  09/03/19 Telemedicine [name], MD [facility]  Showing today's visits and meeting all other requirements   Future Appointments No visits were found meeting these conditions.  Showing future appointments within next 90 days and meeting all other requirements   I discussed the assessment and treatment plan with the patient. The patient was provided an opportunity to ask questions and all were answered. The patient agreed with the plan and demonstrated an understanding of the instructions.  Patient advised to call back or seek an in-person evaluation if the symptoms or condition worsens.  Duration of encounter: 15 minutes.  Note by: Gaspar Cola, MD Date: 09/03/2019; Time: 11:22 AM

## 2019-09-03 ENCOUNTER — Other Ambulatory Visit: Payer: Self-pay

## 2019-09-03 ENCOUNTER — Telehealth: Payer: Self-pay | Admitting: *Deleted

## 2019-09-03 ENCOUNTER — Ambulatory Visit: Payer: Medicare Other | Attending: Pain Medicine | Admitting: Pain Medicine

## 2019-09-03 DIAGNOSIS — G894 Chronic pain syndrome: Secondary | ICD-10-CM

## 2019-09-03 DIAGNOSIS — M47816 Spondylosis without myelopathy or radiculopathy, lumbar region: Secondary | ICD-10-CM

## 2019-09-03 DIAGNOSIS — G893 Neoplasm related pain (acute) (chronic): Secondary | ICD-10-CM

## 2019-09-03 DIAGNOSIS — M79605 Pain in left leg: Secondary | ICD-10-CM | POA: Diagnosis not present

## 2019-09-03 DIAGNOSIS — K7031 Alcoholic cirrhosis of liver with ascites: Secondary | ICD-10-CM

## 2019-09-03 DIAGNOSIS — M5442 Lumbago with sciatica, left side: Secondary | ICD-10-CM | POA: Diagnosis not present

## 2019-09-03 DIAGNOSIS — G8929 Other chronic pain: Secondary | ICD-10-CM

## 2019-09-03 DIAGNOSIS — M7918 Myalgia, other site: Secondary | ICD-10-CM

## 2019-09-03 DIAGNOSIS — Z79899 Other long term (current) drug therapy: Secondary | ICD-10-CM

## 2019-09-03 DIAGNOSIS — M961 Postlaminectomy syndrome, not elsewhere classified: Secondary | ICD-10-CM

## 2019-09-03 MED ORDER — CYCLOBENZAPRINE HCL 5 MG PO TABS: 5.0000 mg | ORAL_TABLET | Freq: Three times a day (TID) | ORAL | 5 refills | Status: AC | PRN

## 2019-09-03 MED ORDER — OXYCODONE HCL 20 MG PO TABS: 1.0000 | ORAL_TABLET | Freq: Four times a day (QID) | ORAL | 0 refills | Status: DC

## 2019-09-07 LAB — TOXASSURE SELECT 13 (MW), URINE

## 2019-12-05 ENCOUNTER — Encounter: Payer: Self-pay | Admitting: Pain Medicine

## 2019-12-05 ENCOUNTER — Ambulatory Visit: Payer: Medicare Other | Attending: Pain Medicine | Admitting: Pain Medicine

## 2019-12-05 ENCOUNTER — Other Ambulatory Visit: Payer: Self-pay

## 2019-12-05 VITALS — BP 140/72 | HR 90 | Temp 97.9°F | Ht 69.0 in | Wt 180.0 lb

## 2019-12-05 DIAGNOSIS — M5442 Lumbago with sciatica, left side: Secondary | ICD-10-CM | POA: Diagnosis not present

## 2019-12-05 DIAGNOSIS — G8929 Other chronic pain: Secondary | ICD-10-CM | POA: Diagnosis present

## 2019-12-05 DIAGNOSIS — M79605 Pain in left leg: Secondary | ICD-10-CM | POA: Insufficient documentation

## 2019-12-05 DIAGNOSIS — Z79899 Other long term (current) drug therapy: Secondary | ICD-10-CM | POA: Diagnosis present

## 2019-12-05 DIAGNOSIS — F1120 Opioid dependence, uncomplicated: Secondary | ICD-10-CM | POA: Diagnosis present

## 2019-12-05 DIAGNOSIS — G893 Neoplasm related pain (acute) (chronic): Secondary | ICD-10-CM | POA: Diagnosis present

## 2019-12-05 DIAGNOSIS — G894 Chronic pain syndrome: Secondary | ICD-10-CM | POA: Diagnosis not present

## 2019-12-05 MED ORDER — OXYCODONE HCL 20 MG PO TABS: 1.0000 | ORAL_TABLET | Freq: Four times a day (QID) | ORAL | 0 refills | Status: AC

## 2019-12-05 MED ORDER — OXYCODONE HCL 20 MG PO TABS: 1.0000 | ORAL_TABLET | Freq: Four times a day (QID) | ORAL | 0 refills | Status: DC

## 2019-12-05 NOTE — Progress Notes (Signed)
PROVIDER NOTE: Information contained herein reflects review and annotations entered in association with encounter. Interpretation of such information and data should be left to medically-trained personnel. Information provided to patient can be located elsewhere in the medical record under "Patient Instructions". Document created using STT-dictation technology, any transcriptional errors that may result from process are unintentional.    Patient: Audrey Garrett  Service Category: E/M  Provider: Gaspar Cola, MD  DOB: February 25, 1961  DOS: 12/05/2019  Specialty: Interventional Pain Management  MRN: [id number]  Setting: Ambulatory outpatient  PCP: Jodi Marble, MD  Type: Established Patient    Referring Provider: Jodi Marble, MD  Location: Office  Delivery: Face-to-face     HPI  Reason for encounter: Audrey Garrett, a 59 y.o. year old female, is here today for evaluation and management of her Cancer-related pain [G89.3]. Audrey Garrett primary complain today is Back Pain Last encounter: Practice (09/03/2019). My last encounter with her was on Visit date not found. Pertinent problems: Audrey Garrett has Cervical radicular pain; Failed back surgical syndrome; Lumbar facet syndrome (Bilateral) (L>R); Osteoarthritis of spine with radiculopathy, lumbar region; Chronic musculoskeletal pain; Chronic low back pain (1ry area of Pain) (Bilateral) (L>R); Lumbar spondylosis; Chronic lumbar radicular pain (Left); Chronic neck pain; Chronic lower extremity pain (2ry area of Pain) (Left); Chronic sacroiliac joint pain (Bilateral) (R>L); Chronic upper back pain; Polyneuropathy; Abdominal pain, chronic, epigastric; Neurogenic pain; S/P thoracolumbar fusion (T5-L5); Grade 1 Retrolisthesis (4 mm) of C5 over C6 and C6 over C7; Cervical foraminal stenosis (Left C5-6; Bilateral C6-7); Chronic pain syndrome; Bilateral leg edema; Chronic hip pain (Bilateral); Osteoarthritis of hip (Bilateral); Chronic groin pain, left; Chronic  left sacroiliac joint pain; Swelling of left lower extremity; Dislocation of tarsal joint of right foot; H/O malignant neoplasm of lung; H/O renal cell cancer; Charcot's joint of left foot; Closed fracture of proximal end of left humerus; Cancer-related pain; DDD (degenerative disc disease), lumbosacral; Coccygodynia; and Charcot's joint, right ankle and foot on their pertinent problem list. Pain Assessment: Severity of Chronic pain is reported as a 6 /10. Location: Back Lower/pain radiaties down left leg. Onset: More than a month ago. Quality: Dull, Constant, Shooting, Throbbing. Timing: Constant. Modifying factor(s): sitting, meds. Vitals:  height is $RemoveB'5\' 9"'LLoGVLBI$  (1.753 m) and weight is 180 lb (81.6 kg). Her temperature is 97.9 F (36.6 C). Her blood pressure is 140/72 and her pulse is 90. Her oxygen saturation is 100%.    The patient indicates doing well with the current medication regimen. No adverse reactions or side effects reported to the medications. Today we will be transferring her cyclobenzaprine (Flexeril) 5 mg tablet, 1 tablet p.o. 3 times daily schedule to the patient's PCP. RTC MM:03/07/2020.  Transfer: Cyclobenzaprine (Flexeril) 5 mg tablet, 1 tablet p.o. 3 times daily (90/month) (03/01/2020)  Pharmacotherapy Assessment   Analgesic: Oxycodone IR 20 mg, 1 tab PO q 6 hrs(80 mg/day of oxycodone) MME/day:120 mg/day.   Monitoring: [place] PMP: PDMP reviewed during this encounter.       Pharmacotherapy: No side-effects or adverse reactions reported. Compliance: No problems identified. Effectiveness: Clinically acceptable.  Chauncey Fischer, RN  12/05/2019 10:39 AM  Sign when Signing Visit Nursing Pain Medication Assessment:  Safety precautions to be maintained throughout the outpatient stay will include: orient to surroundings, keep bed in low position, maintain call bell within reach at all times, provide assistance with transfer out of bed and ambulation.  Medication Inspection Compliance: Pill  count conducted under aseptic conditions, in front of  the patient. Neither the pills nor the bottle was removed from the patient's sight at any time. Once count was completed pills were immediately returned to the patient in their original bottle.  Medication: Oxycodone IR Pill/Patch Count: 12 of 120 pills remain Pill/Patch Appearance: Markings consistent with prescribed medication Bottle Appearance: Standard pharmacy container. Clearly labeled. Filled Date: 8 / 24 / 21 Last Medication intake:  TodaySafety precautions to be maintained throughout the outpatient stay will include: orient to surroundings, keep bed in low position, maintain call bell within reach at all times, provide assistance with transfer out of bed and ambulation.     UDS:  Summary  Date Value Ref Range Status  09/04/2019 Note  Final    Comment:    ==================================================================== [facility] (MW) ==================================================================== Test                             Result       Flag       Units  Drug Present and Declared for Prescription Verification   Oxycodone                      5945         EXPECTED   ng/mg creat   Oxymorphone                    4411         EXPECTED   ng/mg creat   Noroxycodone                   14418        EXPECTED   ng/mg creat   Noroxymorphone                 2307         EXPECTED   ng/mg creat    Sources of oxycodone are scheduled prescription medications.    Oxymorphone, noroxycodone, and noroxymorphone are expected    metabolites of oxycodone. Oxymorphone is also available as a    scheduled prescription medication.  ==================================================================== Test                      Result    Flag   Units      Ref Range   Creatinine              55               mg/dL      >=20 ==================================================================== Declared Medications:  The flagging  and interpretation on this report are based on the  following declared medications.  Unexpected results may arise from  inaccuracies in the declared medications.   **Note: The testing scope of this panel includes these medications:   Oxycodone   **Note: The testing scope of this panel does not include the  following reported medications:   Cyclobenzaprine (Flexeril)  Escitalopram (Lexapro)  Furosemide (Lasix)  Insulin Tyler Aas)  Linaclotide (Linzess)  Metformin (Glucophage)  Multivitamin  Pantoprazole (Protonix)  Pregabalin (Lyrica)  Rosuvastatin (Crestor)  Semaglutide (Ozempic)  Spironolactone (Aldactone) ==================================================================== For clinical consultation, please call [phone]. ====================================================================      ROS  Constitutional: Denies any fever or chills Gastrointestinal: No reported hemesis, hematochezia, vomiting, or acute GI distress Musculoskeletal: Denies any acute onset joint swelling, redness, loss of ROM, or weakness Neurological: No reported episodes of acute onset apraxia, aphasia, dysarthria, agnosia, amnesia, paralysis,  loss of coordination, or loss of consciousness  Medication Review  Oxycodone HCl, Semaglutide (1 MG/DOSE), cyclobenzaprine, escitalopram, furosemide, insulin degludec, linaclotide, metFORMIN, multivitamin with minerals, pantoprazole, pregabalin, rosuvastatin, and spironolactone  History Review  Allergy: Audrey Garrett has No Known Allergies. Drug: Audrey Garrett  reports no history of drug use. Alcohol:  reports previous alcohol use. Tobacco:  reports that she has been smoking cigarettes. She has a 22.50 pack-year smoking history. She has never used smokeless tobacco. Social: Audrey Garrett  reports that she has been smoking cigarettes. She has a 22.50 pack-year smoking history. She has never used smokeless tobacco. She reports previous alcohol use. She reports  that she does not use drugs. Medical:  has a past medical history of Alcohol abuse, Alcoholic cirrhosis ([place]), Anxiety, Chronic hepatitis C ([place]), Chronic LBP (12/25/2014), Chronic pain syndrome, DDD (degenerative disc disease), lumbar, Diverticulosis, Emphysema lung ([place]), Fibromyalgia, Full dentures, GERD (gastroesophageal reflux disease), Hiatal hernia, History of GI bleed, Hypertension, Iron deficiency anemia due to chronic blood loss, Lumbar facet joint syndrome, Lumbar spondylosis, Lung cancer ([place]) (11/25/2016), MDD (major depressive disorder), Nephrolithiasis, OA (osteoarthritis of spine), Polyarthritis, Polyneuropathy, Restless leg syndrome, Right renal mass, RLS (restless legs syndrome), SOB (shortness of breath), Type 2 diabetes mellitus ([place]), Urinary frequency, and Vitamin D deficiency disease. Surgical: Audrey Garrett  has a past surgical history that includes Esophagogastroduodenoscopy (N/A, 04/14/2015); Esophagogastroduodenoscopy (egd) with propofol (N/A, 09/16/2015); Colonoscopy with propofol (N/A, 09/25/2015); Umbilical hernia repair (N/A, 11/06/2015); Esophagogastroduodenoscopy (egd) with propofol (N/A, 01/22/2016); Colonoscopy with propofol (N/A, 04/01/2016); Esophagogastroduodenoscopy (egd) with propofol (N/A, 04/01/2016); Electormagnetic navigation bronchoscopy (N/A, 11/25/2016); IR Radiologist Eval & Mgmt (04/13/2017); IR Radiologist Eval & Mgmt (11/15/2017); Spinal fusion (12/19/2011); Endometrial ablation (2007); Tubal ligation (Bilateral, yrs ago); Tonsillectomy (age 50); Radiology with anesthesia (Right, 12/14/2017); and Foot arthrodesis (Right, 04/06/2018). Family: family history includes [name] in her father; Breast cancer (age of onset: 56) in her sister; Heart disease in her mother; Stroke in her mother.  Laboratory Chemistry Profile   Renal Lab Results  Component Value Date   BUN 8 07/04/2019   CREATININE 0.56 07/04/2019   GFRAA >60 07/04/2019   GFRNONAA >60 07/04/2019     Hepatic Lab  Results  Component Value Date   AST 39 07/04/2019   ALT 20 07/04/2019   ALBUMIN 4.1 07/04/2019   ALKPHOS 93 07/04/2019   LIPASE 32 11/06/2015   AMMONIA 12 09/14/2016     Electrolytes Lab Results  Component Value Date   NA 132 (L) 07/04/2019   K 3.7 07/04/2019   CL 93 (L) 07/04/2019   CALCIUM 8.8 (L) 07/04/2019   MG 1.9 01/22/2016   PHOS 3.6 11/23/2013     Bone No results found for: VD25OH, VD125OH2TOT, NT6144RX5, QM0867YP9, 25OHVITD1, 25OHVITD2, 25OHVITD3, TESTOFREE, TESTOSTERONE   Inflammation (CRP: Acute Phase) (ESR: Chronic Phase) Lab Results  Component Value Date   CRP <0.5 04/03/2015   ESRSEDRATE 27 04/03/2015   LATICACIDVEN 2.7 ([place]) 04/23/2017       Note: Above Lab results reviewed.  Recent Imaging Review  US PELVIC COMPLETE WITH TRANSVAGINAL CLINICAL DATA:  Adnexal cyst, follow-up  EXAM: TRANSABDOMINAL AND TRANSVAGINAL ULTRASOUND OF PELVIS  TECHNIQUE: Both transabdominal and transvaginal ultrasound examinations of the pelvis were performed. Transabdominal technique was performed for global imaging of the pelvis including uterus, ovaries, adnexal regions, and pelvic cul-de-sac. It was necessary to proceed with endovaginal exam following the transabdominal exam to visualize the uterus, endometrium and ovaries.  COMPARISON:  CT abdomen and pelvis 07/20/2019, prior pelvic ultrasound 10/30/2015  FINDINGS: Uterus  Measurements: 5.4 x 2.6 x 3.8 cm = volume: 27 mL. Poorly visualized on transabdominal imaging due to inadequate bladder distention and on transvaginal imaging due to position. Atrophic appearance without gross mass  Endometrium  Thickness: Poorly visualized, question 5 mm thick. No obvious endometrial fluid or mass  Right ovary  No normal appearing RIGHT ovary visualized, see below  Left ovary  Not visualized, likely obscured by bowel  Other findings  No free pelvic fluid. Large cyst identified within RIGHT adnexa, 7.2 x 5.2 x 6.1  cm (volume = 120 cm^3), measured 6.5 x 5.1 x 5.8 cm (volume = 100 cm^3) on prior CT.  IMPRESSION: Suboptimal visualization of uterus and endometrial complex with nonvisualization of LEFT ovary.  Large cyst in RIGHT adnexa 7.2 cm in greatest diameter, increased in volume by 20% since the prior CT exam and complicated by scattered internal echoes.  Surgical evaluation recommended to exclude cystic ovarian neoplasm.  These results will be called to the ordering clinician or representative by the Radiologist Assistant, and communication documented in the PACS or [employer].  Electronically Signed   By: Lavonia Dana M.D.   On: 08/07/2019 17:37 Note: Reviewed        Physical Exam  General appearance: Well nourished, well developed, and well hydrated. In no apparent acute distress Mental status: Alert, oriented x 3 (person, place, & time)       Respiratory: No evidence of acute respiratory distress Eyes: PERLA Vitals: BP 140/72   Pulse 90   Temp 97.9 F (36.6 C)   Ht $R'5\' 9"'CD$  (1.753 m)   Wt 180 lb (81.6 kg)   SpO2 100%   BMI 26.58 kg/m  BMI: Estimated body mass index is 26.58 kg/m as calculated from the following:   Height as of this encounter: $RemoveBeforeD'5\' 9"'aaPOAXxYpdabqz$  (1.753 m).   Weight as of this encounter: 180 lb (81.6 kg). Ideal: Ideal body weight: 66.2 kg (145 lb 15.1 oz) Adjusted ideal body weight: 72.4 kg (159 lb 9.1 oz)  Assessment   Status Diagnosis  Controlled Controlled Controlled 1. Cancer-related pain   2. Chronic pain syndrome   3. Chronic low back pain (1ry area of Pain) (Bilateral) (L>R)   4. Chronic lower extremity pain (2ry area of Pain) (Left)   5. Pharmacologic therapy   6. Uncomplicated opioid dependence ([place])      Updated Problems: Problem  Chronic lower extremity pain (2ry area of Pain) (Left)  Chronic low back pain (1ry area of Pain) (Bilateral) (L>R)  Uncomplicated Opioid Dependence ([facility])    Plan of Care  Problem-specific:  No problem-specific  Assessment & Plan notes found for this encounter.  Audrey Garrett has a current medication list which includes the following long-term medication(s): linzess, oxycodone hcl, [START ON 01/07/2020] oxycodone hcl, [START ON 02/06/2020] oxycodone hcl, rosuvastatin, and spironolactone.  Pharmacotherapy (Medications Ordered): Meds ordered this encounter  Medications  . Oxycodone HCl 20 MG TABS    Sig: Take 1 tablet (20 mg total) by mouth every 6 (six) hours. Must last 30 days    Dispense:  120 tablet    Refill:  0    Chronic Pain: STOP Act (Not applicable) Fill 1 day early if closed on refill date. Do not fill until: 12/08/2019. To last until: 01/07/2020. Avoid benzodiazepines within 8 hours of opioids  . Oxycodone HCl 20 MG TABS    Sig: Take 1 tablet (20 mg total) by mouth every 6 (six) hours. Must last 30 days  Dispense:  120 tablet    Refill:  0    Chronic Pain: STOP Act (Not applicable) Fill 1 day early if closed on refill date. Do not fill until: 01/07/2020. To last until: 02/06/2020. Avoid benzodiazepines within 8 hours of opioids  . Oxycodone HCl 20 MG TABS    Sig: Take 1 tablet (20 mg total) by mouth every 6 (six) hours. Must last 30 days    Dispense:  120 tablet    Refill:  0    Chronic Pain: STOP Act (Not applicable) Fill 1 day early if closed on refill date. Do not fill until: 02/06/2020. To last until: 03/07/2020. Avoid benzodiazepines within 8 hours of opioids   Orders:  No orders of the defined types were placed in this encounter.  Follow-up plan:   Return in about 3 months (around 03/04/2020) for (20-min), (F2F), (Med Mgmt).      Interventional management options:  Considering:   NOTE: Hx of Thrombocytopenia  Diagnostic bilateral lumbar facetblock  Possible bilateral lumbar facet RFA Diagnostic left caudal ESI + diagnostic epidurogram Possible Racz procedure Diagnostic bilateral SIjoint injection  Possible bilateral SI joint RFA Diagnostic left CESI   Diagnostic bilateral cervical facetblock  Possible bilateral cervical facet RFA   Palliative PRN treatment(s):   Diagnostic bilateral lumbar facet block Diagnostic bilateral IA hipjoint injection Diagnostic bilateral SIjoint block     Recent Visits Date Type Provider Dept  12/05/19 Office Visit [name], MD [facility]  Showing recent visits within past 90 days and meeting all other requirements Future Appointments Date Type Provider Dept  03/14/20 Appointment [name], MD [facility]  Showing future appointments within next 90 days and meeting all other requirements  I discussed the assessment and treatment plan with the patient. The patient was provided an opportunity to ask questions and all were answered. The patient agreed with the plan and demonstrated an understanding of the instructions.  Patient advised to call back or seek an in-person evaluation if the symptoms or condition worsens.  Duration of encounter: 30 minutes.  Note by: Gaspar Cola, MD Date: 12/05/2019; Time: 4:31 PM

## 2019-12-05 NOTE — Progress Notes (Signed)
Nursing Pain Medication Assessment:  Safety precautions to be maintained throughout the outpatient stay will include: orient to surroundings, keep bed in low position, maintain call bell within reach at all times, provide assistance with transfer out of bed and ambulation.  Medication Inspection Compliance: Pill count conducted under aseptic conditions, in front of the patient. Neither the pills nor the bottle was removed from the patient's sight at any time. Once count was completed pills were immediately returned to the patient in their original bottle.  Medication: Oxycodone IR Pill/Patch Count: 12 of 120 pills remain Pill/Patch Appearance: Markings consistent with prescribed medication Bottle Appearance: Standard pharmacy container. Clearly labeled. Filled Date: 8 / 58 / 21 Last Medication intake:  TodaySafety precautions to be maintained throughout the outpatient stay will include: orient to surroundings, keep bed in low position, maintain call bell within reach at all times, provide assistance with transfer out of bed and ambulation.

## 2019-12-16 ENCOUNTER — Observation Stay
Admission: EM | Admit: 2019-12-16 | Discharge: 2019-12-17 | Disposition: A | Discharge status: Hospice | Payer: Medicare Other | Attending: Internal Medicine | Admitting: Internal Medicine

## 2019-12-16 ENCOUNTER — Emergency Department: Payer: Medicare Other

## 2019-12-16 ENCOUNTER — Encounter: Payer: Self-pay | Admitting: Emergency Medicine

## 2019-12-16 ENCOUNTER — Other Ambulatory Visit: Payer: Self-pay

## 2019-12-16 DIAGNOSIS — E785 Hyperlipidemia, unspecified: Secondary | ICD-10-CM | POA: Diagnosis present

## 2019-12-16 DIAGNOSIS — D696 Thrombocytopenia, unspecified: Secondary | ICD-10-CM | POA: Diagnosis present

## 2019-12-16 DIAGNOSIS — I10 Essential (primary) hypertension: Secondary | ICD-10-CM | POA: Diagnosis not present

## 2019-12-16 DIAGNOSIS — Z7189 Other specified counseling: Secondary | ICD-10-CM

## 2019-12-16 DIAGNOSIS — R601 Generalized edema: Secondary | ICD-10-CM

## 2019-12-16 DIAGNOSIS — F1099 Alcohol use, unspecified with unspecified alcohol-induced disorder: Secondary | ICD-10-CM | POA: Diagnosis not present

## 2019-12-16 DIAGNOSIS — F17210 Nicotine dependence, cigarettes, uncomplicated: Secondary | ICD-10-CM | POA: Insufficient documentation

## 2019-12-16 DIAGNOSIS — E119 Type 2 diabetes mellitus without complications: Secondary | ICD-10-CM | POA: Diagnosis not present

## 2019-12-16 DIAGNOSIS — R188 Other ascites: Secondary | ICD-10-CM

## 2019-12-16 DIAGNOSIS — D649 Anemia, unspecified: Secondary | ICD-10-CM | POA: Diagnosis present

## 2019-12-16 DIAGNOSIS — Z79899 Other long term (current) drug therapy: Secondary | ICD-10-CM | POA: Insufficient documentation

## 2019-12-16 DIAGNOSIS — K7031 Alcoholic cirrhosis of liver with ascites: Secondary | ICD-10-CM | POA: Diagnosis not present

## 2019-12-16 DIAGNOSIS — F1020 Alcohol dependence, uncomplicated: Secondary | ICD-10-CM | POA: Diagnosis present

## 2019-12-16 DIAGNOSIS — I152 Hypertension secondary to endocrine disorders: Secondary | ICD-10-CM | POA: Diagnosis present

## 2019-12-16 DIAGNOSIS — R109 Unspecified abdominal pain: Secondary | ICD-10-CM | POA: Diagnosis present

## 2019-12-16 DIAGNOSIS — E871 Hypo-osmolality and hyponatremia: Secondary | ICD-10-CM | POA: Diagnosis not present

## 2019-12-16 DIAGNOSIS — Z20822 Contact with and (suspected) exposure to covid-19: Secondary | ICD-10-CM | POA: Diagnosis not present

## 2019-12-16 DIAGNOSIS — E1169 Type 2 diabetes mellitus with other specified complication: Secondary | ICD-10-CM | POA: Diagnosis present

## 2019-12-16 DIAGNOSIS — E1159 Type 2 diabetes mellitus with other circulatory complications: Secondary | ICD-10-CM | POA: Diagnosis present

## 2019-12-16 LAB — PROTIME-INR
INR: 1.4 — ABNORMAL HIGH (ref 0.8–1.2)
Prothrombin Time: 16.3 seconds — ABNORMAL HIGH (ref 11.4–15.2)

## 2019-12-16 LAB — CBC
HCT: 30.7 % — ABNORMAL LOW (ref 36.0–46.0)
Hemoglobin: 9 g/dL — ABNORMAL LOW (ref 12.0–15.0)
MCH: 27.4 pg (ref 26.0–34.0)
MCHC: 29.3 g/dL — ABNORMAL LOW (ref 30.0–36.0)
MCV: 93.6 fL (ref 80.0–100.0)
Platelets: 130 10*3/uL — ABNORMAL LOW (ref 150–400)
RBC: 3.28 MIL/uL — ABNORMAL LOW (ref 3.87–5.11)
RDW: 23.8 % — ABNORMAL HIGH (ref 11.5–15.5)
WBC: 9.4 10*3/uL (ref 4.0–10.5)
nRBC: 0 % (ref 0.0–0.2)

## 2019-12-16 LAB — COMPREHENSIVE METABOLIC PANEL
ALT: 26 U/L (ref 0–44)
AST: 74 U/L — ABNORMAL HIGH (ref 15–41)
Albumin: 2.7 g/dL — ABNORMAL LOW (ref 3.5–5.0)
Alkaline Phosphatase: 110 U/L (ref 38–126)
Anion gap: 11 (ref 5–15)
BUN: <5 mg/dL — ABNORMAL LOW (ref 6–20)
CO2: 21 mmol/L — ABNORMAL LOW (ref 22–32)
Calcium: 7.9 mg/dL — ABNORMAL LOW (ref 8.9–10.3)
Chloride: 97 mmol/L — ABNORMAL LOW (ref 98–111)
Creatinine, Ser: 0.48 mg/dL (ref 0.44–1.00)
GFR calc Af Amer: >60 mL/min (ref >60)
GFR calc non Af Amer: >60 mL/min (ref >60)
Glucose, Bld: 183 mg/dL — ABNORMAL HIGH (ref 70–99)
Potassium: 5.1 mmol/L (ref 3.5–5.1)
Sodium: 129 mmol/L — ABNORMAL LOW (ref 135–145)
Total Bilirubin: 1.1 mg/dL (ref 0.3–1.2)
Total Protein: 6.7 g/dL (ref 6.5–8.1)

## 2019-12-16 LAB — LIPASE, BLOOD: Lipase: 46 U/L (ref 11–51)

## 2019-12-16 LAB — AMMONIA: Ammonia: 55 umol/L — ABNORMAL HIGH (ref 9–35)

## 2019-12-16 LAB — SARS CORONAVIRUS 2 BY RT PCR (HOSPITAL ORDER, PERFORMED IN ~~LOC~~ HOSPITAL LAB): SARS Coronavirus 2: NEGATIVE

## 2019-12-16 MED ORDER — ONDANSETRON HCL 4 MG PO TABS: 4.0000 mg | ORAL_TABLET | Freq: Four times a day (QID) | ORAL | Status: DC | PRN

## 2019-12-16 MED ORDER — ONDANSETRON HCL 4 MG/2ML IJ SOLN
4.0000 mg | Freq: Once | INTRAMUSCULAR | Status: AC
  Administered 2019-12-16: 4 mg via INTRAVENOUS
  Filled 2019-12-16: qty 2

## 2019-12-16 MED ORDER — ESCITALOPRAM OXALATE 10 MG PO TABS
10.0000 mg | ORAL_TABLET | Freq: Every day | ORAL | Status: DC
  Administered 2019-12-17: 10 mg via ORAL
  Filled 2019-12-16: qty 1

## 2019-12-16 MED ORDER — ADULT MULTIVITAMIN W/MINERALS CH
1.0000 | ORAL_TABLET | Freq: Every day | ORAL | Status: DC
  Filled 2019-12-16: qty 1

## 2019-12-16 MED ORDER — FUROSEMIDE 10 MG/ML IJ SOLN
40.0000 mg | Freq: Once | INTRAMUSCULAR | Status: AC
  Administered 2019-12-16: 40 mg via INTRAVENOUS
  Filled 2019-12-16: qty 4

## 2019-12-16 MED ORDER — ALBUMIN HUMAN 25 % IV SOLN
25.0000 g | Freq: Once | INTRAVENOUS | Status: DC | PRN
  Filled 2019-12-16: qty 100

## 2019-12-16 MED ORDER — LORAZEPAM 1 MG PO TABS: 1.0000 mg | ORAL_TABLET | ORAL | Status: DC | PRN

## 2019-12-16 MED ORDER — THIAMINE HCL 100 MG/ML IJ SOLN
100.0000 mg | Freq: Every day | INTRAMUSCULAR | Status: DC
  Administered 2019-12-17: 100 mg via INTRAVENOUS
  Filled 2019-12-16: qty 2

## 2019-12-16 MED ORDER — THIAMINE HCL 100 MG PO TABS: 100.0000 mg | ORAL_TABLET | Freq: Every day | ORAL | Status: DC

## 2019-12-16 MED ORDER — SPIRONOLACTONE 100 MG PO TABS
100.0000 mg | ORAL_TABLET | Freq: Every day | ORAL | Status: DC
  Administered 2019-12-17: 100 mg via ORAL
  Filled 2019-12-16: qty 1

## 2019-12-16 MED ORDER — FOLIC ACID 1 MG PO TABS
1.0000 mg | ORAL_TABLET | Freq: Every day | ORAL | Status: DC
  Administered 2019-12-17: 1 mg via ORAL
  Filled 2019-12-16: qty 1

## 2019-12-16 MED ORDER — FUROSEMIDE 40 MG PO TABS
40.0000 mg | ORAL_TABLET | Freq: Two times a day (BID) | ORAL | Status: DC
  Administered 2019-12-17: 40 mg via ORAL
  Filled 2019-12-16: qty 1

## 2019-12-16 MED ORDER — HYDROMORPHONE HCL 1 MG/ML IJ SOLN
1.0000 mg | Freq: Once | INTRAMUSCULAR | Status: AC
  Administered 2019-12-16: 1 mg via INTRAVENOUS
  Filled 2019-12-16: qty 1

## 2019-12-16 MED ORDER — OXYCODONE HCL 5 MG PO TABS
20.0000 mg | ORAL_TABLET | Freq: Four times a day (QID) | ORAL | Status: DC | PRN
  Administered 2019-12-17 (×2): 20 mg via ORAL
  Filled 2019-12-16 (×2): qty 4

## 2019-12-16 MED ORDER — ONDANSETRON HCL 4 MG/2ML IJ SOLN: 4.0000 mg | Freq: Four times a day (QID) | INTRAMUSCULAR | Status: DC | PRN

## 2019-12-16 MED ORDER — LINACLOTIDE 145 MCG PO CAPS
145.0000 ug | ORAL_CAPSULE | Freq: Every morning | ORAL | Status: DC
  Administered 2019-12-17: 145 ug via ORAL
  Filled 2019-12-16: qty 1

## 2019-12-16 MED ORDER — HYDROMORPHONE HCL 1 MG/ML IJ SOLN
1.0000 mg | INTRAMUSCULAR | Status: AC | PRN
  Administered 2019-12-16 – 2019-12-17 (×3): 1 mg via INTRAVENOUS
  Filled 2019-12-16 (×3): qty 1

## 2019-12-16 MED ORDER — LORAZEPAM 2 MG/ML IJ SOLN: 1.0000 mg | INTRAMUSCULAR | Status: DC | PRN

## 2019-12-16 NOTE — ED Provider Notes (Signed)
[facility] Emergency Department Provider Note  ____________________________________________   First MD Initiated Contact with Patient 12/16/19 1712     (approximate)  I have reviewed the triage vital signs and the nursing notes.   HISTORY  Chief Complaint Abdominal Pain    HPI Audrey Garrett is a 59 y.o. female with past medical history as below including known, severe psoriasis with ongoing alcohol abuse, history of lung cancer, here with multiple complaints.  The patient's primary complaint is increasingly severe bilateral lower extremity edema, abdominal swelling, and shortness of breath.  She has had associated diffuse, 10 of 10, aching, stabbing, abdominal pain that is worse with movement and palpation.  She states that her leg swelling is progressively worsened and she has now become weeping from several open wounds on her legs.  She said associated severe fatigue and shortness of breath, both with exertion as well as with lying flat.  No alleviating factors.  She does admit to ongoing alcohol use as well as smoking.  Denies any medication changes.        Past Medical History:  Diagnosis Date  . Alcohol abuse   . Alcoholic cirrhosis ([place])    GI-- dr skulskie/  hx ascites 2017   . Anxiety   . Chronic hepatitis C ([place])    treated w/ harvoni(2018)  . Chronic LBP 12/25/2014   Overview:  Post extensive surgery   . Chronic pain syndrome    neck , upper back, post extensive spinal surgery  . DDD (degenerative disc disease), lumbar   . Diverticulosis   . Emphysema lung ([place])   . Fibromyalgia   . Full dentures    partials, upper and lower  . GERD (gastroesophageal reflux disease)    gastroparesis  . Hiatal hernia   . History of GI bleed    06/ 2017  ulcers and gastric erosions, transfused x4/  10/ 2017 unknown source/  01/ 2018  transfused x2 units and IV Iron   . Hypertension   . Iron deficiency anemia due to chronic blood loss    hemotologist--  dr Alvy Bimler  . Lumbar facet joint syndrome   . Lumbar spondylosis   . Lung cancer ([place]) 11/25/2016   right upper lobe nodule , Stage I,  treatment SBRT  . MDD (major depressive disorder)   . Nephrolithiasis    per CT 09-30-2017 right side nonobstructive  . OA (osteoarthritis of spine)   . Polyarthritis   . Polyneuropathy   . Restless leg syndrome   . Right renal mass   . RLS (restless legs syndrome)   . SOB (shortness of breath)   . Type 2 diabetes mellitus ([place])    followed by pcp  . Urinary frequency   . Vitamin D deficiency disease     Patient Active Problem List   Diagnosis Date Noted  . Hyperlipidemia associated with type 2 diabetes mellitus ([place]) 12/16/2019  . Alcohol use disorder, severe, dependence ([place]) 12/16/2019  . Ovarian cyst 08/08/2019  . Charcot's joint, right ankle and foot 01/26/2019  . Goals of care, counseling/discussion 12/11/2018  . Disorder of skeletal system 12/11/2018  . Problems influencing health status 12/11/2018  . DDD (degenerative disc disease), lumbosacral 12/11/2018  . Coccygodynia 12/11/2018  . Cancer-related pain 12/10/2018  . Closed fracture of proximal end of left humerus 11/03/2018  . Charcot's joint of left foot 06/27/2018  . Dislocation of tarsal joint of right foot 05/09/2018  . Gastroesophageal reflux disease without esophagitis 05/09/2018  .  H/O malignant neoplasm of lung 05/09/2018  . H/O renal cell cancer 05/09/2018  . Hyponatremia 05/09/2018  . Type 2 diabetes mellitus ([place]) 05/09/2018  . Anemia, iron deficiency 05/09/2018  . Essential hypertension 05/09/2018  . Lung nodule 05/09/2018  . Chronic left sacroiliac joint pain 04/20/2018  . Swelling of left lower extremity 04/20/2018  . Iron deficiency anemia 02/03/2018  . Renal lesion 12/14/2017  . Malignant neoplasm of upper lobe of right lung ([place]) 12/02/2016  . Chronic groin pain, left 08/19/2016  . Personal history of tobacco use, presenting hazards to health 08/04/2016  .  Osteoarthritis of hip (Bilateral) 07/12/2016  . Chronic hip pain (Bilateral) 07/07/2016  . Chronic hepatitis C without hepatic coma ([place]) 05/13/2016  . Bilateral leg edema 04/07/2016  . Opiate use 02/20/2016  . RLS (restless legs syndrome) 02/23/2016  . Iron deficiency anemia due to chronic blood loss 02/11/2016  . Controlled type 2 diabetes mellitus with hyperglycemia ([place]) 01/21/2016  . Acute blood loss anemia 01/21/2016  . Malnutrition of moderate degree 01/21/2016  . Chronic pain syndrome   . Hernia of anterior abdominal wall   . Incarcerated umbilical hernia   . Neurogenic pain 10/01/2015  . S/P thoracolumbar fusion (T5-L5) 10/01/2015  . Grade 1 Retrolisthesis (4 mm) of C5 over C6 and C6 over C7 10/01/2015  . Cervical foraminal stenosis (Left C5-6; Bilateral C6-7) 10/01/2015  . Acute GI bleeding 09/22/2015  . Depression, major, recurrent, moderate ([place]) 05/27/2015  . Abdominal pain, chronic, epigastric 05/27/2015  . Difficulty urinating 05/26/2015  . Nephrolithiasis 05/26/2015  . Alcoholic cirrhosis of liver with ascites ([place]) 05/26/2015  . Hypotension 05/22/2015  . Anemia 05/22/2015  . Thrombocytopenia ([place]) 05/22/2015  . Coagulopathy ([place]) 05/22/2015  . Hyperglycemia 05/22/2015  . Polyneuropathy 05/22/2015  . Urinary retention 05/22/2015  . Hepatic cirrhosis ([place]) 05/19/2015  . Cirrhosis of liver with ascites ([place]) 05/19/2015  . Hypomagnesemia (low magnesium levels) 04/07/2015  . At risk for osteopenia 04/03/2015  . Lumbar spondylosis 04/03/2015  . Chronic lumbar radicular pain (Left) 04/03/2015  . Chronic neck pain 04/03/2015  . Chronic lower extremity pain (2ry area of Pain) (Left) 04/03/2015  . Chronic sacroiliac joint pain (Bilateral) (R>L) 04/03/2015  . Chronic upper back pain 04/03/2015  . Long term current use of opiate analgesic 04/03/2015  . Encounter for chronic pain management 04/03/2015  . Cervical radicular pain 01/01/2015  . Uncomplicated opioid  dependence ([place]) 01/01/2015  . Long term prescription opiate use 01/01/2015  . 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 Lumbar facet syndrome (Bilateral) (L>R) 01/01/2015  . Osteoarthritis of spine with radiculopathy, lumbar region 01/01/2015  . Chronic musculoskeletal pain 01/01/2015  . Chronic low back pain (1ry area of Pain) (Bilateral) (L>R) 01/01/2015  . Insomnia, persistent 12/25/2014  . Hypertension associated with diabetes ([place]) 12/25/2014  . HCV (hepatitis C virus) 12/25/2014  . Depression, major, in remission ([place]) 12/25/2014    Past Surgical History:  Procedure Laterality Date  . COLONOSCOPY WITH PROPOFOL N/A 09/25/2015   Procedure: COLONOSCOPY WITH PROPOFOL;  Surgeon: Lollie Sails, MD;  Location[facility] END[facility]COPY;  Service: Endoscopy;  Laterality: N/A;  . COLONOSCOPY WITH PROPOFOL N/A 04/01/2016   Procedure: COLONOSCOPY WITH PROPOFOL;  Surgeon: Lollie Sails, MD;  Location: [facility] ENDOSCOPY;  Service: Endoscopy;  Laterality: N/A;  . ELECTROMAGNETIC NAVIGATION BROCHOSCOPY N/A 11/25/2016   Procedure: ELECTROMAGNETIC NAVIGATION BRONCHOSCOPY;  Surgeon: Flora Lipps, MD;  Location: [facility] ORS;  Service: Cardiopulmonary;  Laterality: N/A;  . ENDOMETRIAL ABLATION  2007  . ESOPHAGOGASTRODUODENOSCOPY  N/A 04/14/2015   Procedure: ESOPHAGOGASTRODUODENOSCOPY (EGD);  Surgeon: Lollie Sails, MD;  Location: [facility] ENDOSCOPY;  Service: Endoscopy;  Laterality: N/A;  . ESOPHAGOGASTRODUODENOSCOPY (EGD) WITH PROPOFOL N/A 09/16/2015   Procedure: ESOPHAGOGASTRODUODENOSCOPY (EGD) WITH PROPOFOL;  Surgeon: Lollie Sails, MD;  Location[facility] ENDOSCOPY;  Service: Endoscopy;  Laterality: N/A;  . ESOPHAGOGASTRODUODENOSCOPY (EGD) WITH PROPOFOL N/A 01/22/2016   Procedure: ESOPHAGOGASTRODUODENOSCOPY (EGD) WITH PROPOFOL;  Surgeon: [name], MD;  Location: [place];  Service: Endoscopy;  Laterality: N/A;  . ESOPHAGOGASTRODUODENOSCOPY (EGD) WITH PROPOFOL N/A 04/01/2016   Procedure:  ESOPHAGOGASTRODUODENOSCOPY (EGD) WITH PROPOFOL;  Surgeon: Lollie Sails, MD;  Location: [facility] ENDOSCOPY;  Service: Endoscopy;  Laterality: N/A;  . FOOT ARTHRODESIS Right 04/06/2018   Procedure: ARTHRODESIS - HALLUX/IPJOINT;  Surgeon: [name], DPM;  Location: [place];  Service: Podiatry;  Laterality: Right;  HAMMERLOCK IMPLANTS PARAGON 4-0 SCREW LMA LOCAL Diabetic - oral meds  . IR RADIOLOGIST EVAL & MGMT  04/13/2017  . IR RADIOLOGIST EVAL & MGMT  11/15/2017  . RADIOLOGY WITH ANESTHESIA Right 12/14/2017   Procedure: CT MICROWAVE THERMAL ABLATION AND BIOPSY WITH ANESTHESIA;  Surgeon: Markus Daft, MD;  Location: [facility] ORS;  Service: Anesthesiology;  Laterality: Right;  . SPINAL FUSION  12/19/2011   T5 to sacrum (rod, screws)  . TONSILLECTOMY  age 8  . TUBAL LIGATION Bilateral yrs ago  . UMBILICAL HERNIA REPAIR N/A 11/06/2015   Procedure: HERNIA REPAIR UMBILICAL ADULT;  Surgeon: [name], MD;  Location: [facility] ORS;  Service: General;  Laterality: N/A;    Prior to Admission medications   Medication Sig Start Date End Date Taking? Authorizing Provider  cyclobenzaprine (FLEXERIL) 5 MG tablet Take 1 tablet (5 mg total) by mouth 3 (three) times daily as needed for muscle spasms. 09/03/19 03/01/20  [name], MD  escitalopram (LEXAPRO) 10 MG tablet Take 10 mg by mouth daily.  11/09/17   [provider]  furosemide (LASIX) 40 MG tablet Take 40 mg by mouth 2 (two) times daily.  10/13/15   [provider]  LINZESS 145 MCG CAPS capsule Take 145 mcg by mouth every morning. 05/07/19   [provider]  metFORMIN (GLUCOPHAGE) 850 MG tablet Take 850 mg by mouth 2 (two) times daily. 11/01/18   [provider]  Multiple Vitamin (MULTIVITAMIN WITH MINERALS) TABS tablet Take 1 tablet by mouth daily.    [provider]  Oxycodone HCl 20 MG TABS Take 1 tablet (20 mg total) by mouth every 6 (six) hours. Must last 30 days 12/08/19 01/07/20  [name], MD  Oxycodone HCl 20 MG TABS Take 1 tablet (20 mg total) by mouth every 6 (six) hours. Must last 30 days 01/07/20 02/06/20  [name], MD  Oxycodone HCl 20 MG TABS Take 1 tablet (20 mg total) by mouth every 6 (six) hours. Must last 30 days 02/06/20 03/07/20  [name], MD  OZEMPIC 1 MG/DOSE SOPN Inject 1 mg into the skin every Tuesday.    [provider]  pantoprazole (PROTONIX) 40 MG tablet Take 40 mg by mouth 2 (two) times daily.  07/29/16   [provider]  pregabalin (LYRICA) 200 MG capsule Take 200 mg by mouth 2 (two) times daily.    [provider]  rosuvastatin (CRESTOR) 5 MG tablet Take 5 mg by mouth at bedtime. 05/07/19   [provider]  spironolactone (ALDACTONE) 50 MG tablet Take 100 mg by mouth daily.  09/08/16   [provider]  Tyler Aas  FLEXTOUCH 200 UNIT/ML FlexTouch Pen SMARTSIG:10 Unit(s) SUB-Q Daily 05/07/19   [provider]    Allergies Patient has no known allergies.  Family History  Problem Relation Age of Onset  . Stroke Mother   . Heart disease Mother   . [name] Father   . Breast cancer Sister 59  . Kidney disease Neg Hx   . Bladder Cancer Neg Hx   . Kidney cancer Neg Hx   . Prostate cancer Neg Hx     Social History Social History   Tobacco Use  . Smoking status: Current Every Day Smoker    Packs/day: 0.50    Years: 45.00    Pack years: 22.50    Types: Cigarettes  . Smokeless tobacco: Never Used  . Tobacco comment: 12-05-2017 2ppd until 2017 down to 1/2 ppd  Vaping Use  . Vaping Use: Never used  Substance Use Topics  . Alcohol use: Not Currently    Alcohol/week: 0.0 standard drinks    Comment: did drink 12 pack beer per week (7 oz each)  . Drug use: No    Review of Systems  Review of Systems  Constitutional: Positive for fatigue. Negative for fever.  HENT: Negative for congestion and sore throat.   Eyes: Negative for visual disturbance.  Respiratory: Positive for  cough and shortness of breath.   Cardiovascular: Negative for chest pain.  Gastrointestinal: Positive for abdominal pain and nausea. Negative for diarrhea and vomiting.  Genitourinary: Negative for flank pain.  Musculoskeletal: Negative for back pain and neck pain.  Skin: Negative for rash and wound.  Neurological: Positive for weakness.  All other systems reviewed and are negative.    ____________________________________________  PHYSICAL EXAM:      VITAL SIGNS: ED Triage Vitals  Enc Vitals Group     BP 12/16/19 1703 134/71     Pulse Rate 12/16/19 1703 (!) 101     Resp 12/16/19 1703 13     Temp 12/16/19 1703 98.9 F (37.2 C)     Temp Source 12/16/19 1703 Oral     SpO2 12/16/19 1703 100 %     Weight 12/16/19 1705 180 lb (81.6 kg)     Height 12/16/19 1705 5\' 9"  (1.753 m)     Head Circumference --      Peak Flow --      Pain Score 12/16/19 1704 10     Pain Loc --      Pain Edu? --      Excl. in [place]? --      Physical Exam Vitals and nursing note reviewed.  Constitutional:      General: She is not in acute distress.    Appearance: She is well-developed.  HENT:     Head: Normocephalic and atraumatic.  Eyes:     Conjunctiva/sclera: Conjunctivae normal.  Cardiovascular:     Rate and Rhythm: Regular rhythm. Tachycardia present.     Heart sounds: Normal heart sounds. No murmur heard.  No friction rub.  Pulmonary:     Effort: Pulmonary effort is normal. No respiratory distress.     Breath sounds: Normal breath sounds. No wheezing or rales.  Abdominal:     General: Abdomen is protuberant. Bowel sounds are normal. There is distension.     Palpations: Abdomen is soft.     Tenderness: There is generalized abdominal tenderness. There is no guarding or rebound.  Musculoskeletal:     Cervical back: Neck supple.  Skin:    General: Skin is warm.  Capillary Refill: Capillary refill takes less than 2 seconds.  Neurological:     Mental Status: She is alert and oriented to  person, place, and time.     Motor: No abnormal muscle tone.       ____________________________________________   LABS (all labs ordered are listed, but only abnormal results are displayed)  Labs Reviewed  COMPREHENSIVE METABOLIC PANEL - Abnormal; Notable for the following components:      Result Value   Sodium 129 (*)    Chloride 97 (*)    CO2 21 (*)    Glucose, Bld 183 (*)    BUN <5 (*)    Calcium 7.9 (*)    Albumin 2.7 (*)    AST 74 (*)    All other components within normal limits  CBC - Abnormal; Notable for the following components:   RBC 3.28 (*)    Hemoglobin 9.0 (*)    HCT 30.7 (*)    MCHC 29.3 (*)    RDW 23.8 (*)    Platelets 130 (*)    All other components within normal limits  PROTIME-INR - Abnormal; Notable for the following components:   Prothrombin Time 16.3 (*)    INR 1.4 (*)    All other components within normal limits  AMMONIA - Abnormal; Notable for the following components:   Ammonia 55 (*)    All other components within normal limits  SARS CORONAVIRUS 2 BY RT PCR (HOSPITAL ORDER, [facility])  LIPASE, BLOOD  URINALYSIS, COMPLETE (UACMP) WITH MICROSCOPIC  ETHANOL  HIV ANTIBODY (ROUTINE TESTING W REFLEX)  CBC  COMPREHENSIVE METABOLIC PANEL  PROTIME-INR    ____________________________________________  EKG: None ________________________________________  RADIOLOGY All imaging, including plain films, CT scans, and ultrasounds, independently reviewed by me, and interpretations confirmed via formal radiology reads.  ED MD interpretation:   AAS: Negative  Official radiology report(s): DG Abdomen Acute W/Chest  Result Date: 12/16/2019 CLINICAL DATA:  Abdominal pain and shortness of breath EXAM: X-RAY ABDOMEN acute with one-view chest COMPARISON:  None. FINDINGS: There is no evidence of dilated bowel loops or free intraperitoneal air. No radiopaque calculi or other significant radiographic abnormality is seen. Heart size  and mediastinal contours are within normal limits. Both lungs are clear aside from right midlung atelectasis. IMPRESSION: Negative. Electronically Signed   By: Ulyses Jarred M.D.   On: 12/16/2019 19:43    ____________________________________________  PROCEDURES   Procedure(s) performed (including Critical Care):  Procedures  ____________________________________________  INITIAL IMPRESSION / MDM / [place] / ED COURSE  As part of my medical decision making, I reviewed the following data within the [facility] notes reviewed and incorporated, Old chart reviewed, Notes from prior ED visits, and [place] Controlled Substance Database       *Audrey Garrett was evaluated in Emergency Department on 12/16/2019 for the symptoms described in the history of present illness. She was evaluated in the context of the global COVID-19 pandemic, which necessitated consideration that the patient might be at risk for infection with the SARS-CoV-2 virus that causes COVID-19. Institutional protocols and algorithms that pertain to the evaluation of patients at risk for COVID-19 are in a state of rapid change based on information released by regulatory bodies including the CDC and federal and state organizations. These policies and algorithms were followed during the patient's care in the ED.  Some ED evaluations and interventions may be delayed as a result of limited staffing during the pandemic.*  Medical Decision Making:  59 yo F here with abd pain/distension, leg swelling. Labs, exam are c/w worsening ascites and decompensated cirrhosis, now with marked leg edema. CXR is clear. She is tender but WBC normal, bili/LFTs are at baseline, and no clinical signs to suggest SBP. Will admit for diuresis, further management.  ____________________________________________  FINAL CLINICAL IMPRESSION(S) / ED DIAGNOSES  Final diagnoses:  Ascites  Anasarca     MEDICATIONS GIVEN DURING  THIS VISIT:  Medications  HYDROmorphone (DILAUDID) injection 1 mg (has no administration in time range)  ondansetron (ZOFRAN) tablet 4 mg (has no administration in time range)    Or  ondansetron (ZOFRAN) injection 4 mg (has no administration in time range)  albumin human 25 % solution 25 g (has no administration in time range)  oxyCODONE (Oxy IR/ROXICODONE) immediate release tablet 20 mg (has no administration in time range)  escitalopram (LEXAPRO) tablet 10 mg (has no administration in time range)  furosemide (LASIX) tablet 40 mg (has no administration in time range)  linaclotide (LINZESS) capsule 145 mcg (has no administration in time range)  spironolactone (ALDACTONE) tablet 100 mg (has no administration in time range)  LORazepam (ATIVAN) tablet 1-4 mg (has no administration in time range)    Or  LORazepam (ATIVAN) injection 1-4 mg (has no administration in time range)  thiamine tablet 100 mg (has no administration in time range)    Or  thiamine (B-1) injection 100 mg (has no administration in time range)  folic acid (FOLVITE) tablet 1 mg (has no administration in time range)  multivitamin with minerals tablet 1 tablet (has no administration in time range)  HYDROmorphone (DILAUDID) injection 1 mg (1 mg Intravenous Given 12/16/19 1811)  ondansetron (ZOFRAN) injection 4 mg (4 mg Intravenous Given 12/16/19 1811)  furosemide (LASIX) injection 40 mg (40 mg Intravenous Given 12/16/19 1812)  HYDROmorphone (DILAUDID) injection 1 mg (1 mg Intravenous Given 12/16/19 1931)     ED Discharge Orders    None       Note:  This document was prepared using Dragon voice recognition software and may include unintentional dictation errors.   Duffy Bruce, MD 12/16/19 2229

## 2019-12-16 NOTE — H&P (Signed)
History and Physical    Audrey Garrett WEX:[id number] DOB: 1960/10/08 DOA: 12/16/2019  PCP: Jodi Marble, MD  Patient coming from: Home via EMS  I have personally briefly reviewed patient's old medical records in [place]  Chief Complaint: Abdominal pain and swelling  HPI: Audrey Garrett is a 59 y.o. female with medical history significant for alcoholic liver cirrhosis, lung cancer currently on active surveillance with prior SBRT to RUL lung nodule, hepatitis C s/p Harvoni treatment, type 2 diabetes, hypertension, hyperlipidemia, anxiety, iron deficiency anemia, chronic pain, tobacco use, and alcohol use disorder who presents to the ED for evaluation of progressive abdominal pain and swelling.  Patient states that she has been having several weeks of increasing abdominal swelling as well as significant bilateral lower extremity swelling.  She has had associated abdominal discomfort across the top and bottom of her abdomen.  She has had associated shortness of breath.  She also reports an increased nonproductive cough.  She has seen weeping fluid from her legs and has had difficulty bending at her knees due to the amount of edema.  She has not had any nausea or vomiting.  She reports only having about 2 bowel movements per week.  She has not had any diarrhea.  Patient states she is still smoking about 1 pack/day.  She also reports continued alcohol use, about a six-pack of beer per day.  ED Course:  Initial vitals showed BP 134/71, pulse 102, RR 16, temp 98.9 Fahrenheit, SPO2 98% on room air.  Labs notable for sodium 129, potassium 5.1, bicarb 21, BUN <5, creatinine 0.48, serum glucose 183, AST 74, ALT 26, alk phos 110, total bilirubin 1.1, WBC 9.4, hemoglobin 9.0, platelets 130,000, lipase 46, INR 1.4, ammonia 55.  SARS-CoV-2 PCR is obtained and pending.  Acute abdomen in one view chest x-ray was negative for evidence of dilated bowel loops or free intraperitoneal air.  Right  midlung atelectasis is noted otherwise lungs are clear.  Patient was given IV Lasix 40 mg once, IV Zofran 4 mg once, IV Dilaudid 1 mg x 2.  The hospitalist service was consulted to admit for further evaluation and management.  Review of Systems: All systems reviewed and are negative except as documented in history of present illness above.   Past Medical History:  Diagnosis Date  . Alcohol abuse   . Alcoholic cirrhosis ([place])    GI-- dr skulskie/  hx ascites 2017   . Anxiety   . Chronic hepatitis C ([place])    treated w/ harvoni(2018)  . Chronic LBP 12/25/2014   Overview:  Post extensive surgery   . Chronic pain syndrome    neck , upper back, post extensive spinal surgery  . DDD (degenerative disc disease), lumbar   . Diverticulosis   . Emphysema lung ([place])   . Fibromyalgia   . Full dentures    partials, upper and lower  . GERD (gastroesophageal reflux disease)    gastroparesis  . Hiatal hernia   . History of GI bleed    06/ 2017  ulcers and gastric erosions, transfused x4/  10/ 2017 unknown source/  01/ 2018  transfused x2 units and IV Iron   . Hypertension   . Iron deficiency anemia due to chronic blood loss    hemotologist-- dr Alvy Bimler  . Lumbar facet joint syndrome   . Lumbar spondylosis   . Lung cancer ([place]) 11/25/2016   right upper lobe nodule , Stage I,  treatment SBRT  . MDD (  major depressive disorder)   . Nephrolithiasis    per CT 09-30-2017 right side nonobstructive  . OA (osteoarthritis of spine)   . Polyarthritis   . Polyneuropathy   . Restless leg syndrome   . Right renal mass   . RLS (restless legs syndrome)   . SOB (shortness of breath)   . Type 2 diabetes mellitus ([place])    followed by pcp  . Urinary frequency   . Vitamin D deficiency disease     Past Surgical History:  Procedure Laterality Date  . COLONOSCOPY WITH PROPOFOL N/A 09/25/2015   Procedure: COLONOSCOPY WITH PROPOFOL;  Surgeon: Lollie Sails, MD;  Location: [facility] ENDOSCOPY;  Service:  Endoscopy;  Laterality: N/A;  . COLONOSCOPY WITH PROPOFOL N/A 04/01/2016   Procedure: COLONOSCOPY WITH PROPOFOL;  Surgeon: Lollie Sails, MD;  Location: [facility] ENDOSCOPY;  Service: Endoscopy;  Laterality: N/A;  . ELECTROMAGNETIC NAVIGATION BROCHOSCOPY N/A 11/25/2016   Procedure: ELECTROMAGNETIC NAVIGATION BRONCHOSCOPY;  Surgeon: Flora Lipps, MD;  Location: [facility] ORS;  Service: Cardiopulmonary;  Laterality: N/A;  . ENDOMETRIAL ABLATION  2007  . ESOPHAGOGASTRODUODENOSCOPY N/A 04/14/2015   Procedure: ESOPHAGOGASTRODUODENOSCOPY (EGD);  Surgeon: Lollie Sails, MD;  Location: [facility] ENDOSCOPY;  Service: Endoscopy;  Laterality: N/A;  . ESOPHAGOGASTRODUODENOSCOPY (EGD) WITH PROPOFOL N/A 09/16/2015   Procedure: ESOPHAGOGASTRODUODENOSCOPY (EGD) WITH PROPOFOL;  Surgeon: Lollie Sails, MD;  Location: [facility] ENDOSCOPY;  Service: Endoscopy;  Laterality: N/A;  . ESOPHAGOGASTRODUODENOSCOPY (EGD) WITH PROPOFOL N/A 01/22/2016   Procedure: ESOPHAGOGASTRODUODENOSCOPY (EGD) WITH PROPOFOL;  Surgeon: [name], MD;  Location: [place];  Service: Endoscopy;  Laterality: N/A;  . ESOPHAGOGASTRODUODENOSCOPY (EGD) WITH PROPOFOL N/A 04/01/2016   Procedure: ESOPHAGOGASTRODUODENOSCOPY (EGD) WITH PROPOFOL;  Surgeon: Lollie Sails, MD;  Location: [facility] ENDOSCOPY;  Service: Endoscopy;  Laterality: N/A;  . FOOT ARTHRODESIS Right 04/06/2018   Procedure: ARTHRODESIS - HALLUX/IPJOINT;  Surgeon: [name], DPM;  Location: [place];  Service: Podiatry;  Laterality: Right;  HAMMERLOCK IMPLANTS PARAGON 4-0 SCREW LMA LOCAL Diabetic - oral meds  . IR RADIOLOGIST EVAL & MGMT  04/13/2017  . IR RADIOLOGIST EVAL & MGMT  11/15/2017  . RADIOLOGY WITH ANESTHESIA Right 12/14/2017   Procedure: CT MICROWAVE THERMAL ABLATION AND BIOPSY WITH ANESTHESIA;  Surgeon: Markus Daft, MD;  Location: [facility] ORS;  Service: Anesthesiology;  Laterality: Right;  . SPINAL FUSION  12/19/2011   T5 to sacrum (rod, screws)  . TONSILLECTOMY   age 89  . TUBAL LIGATION Bilateral yrs ago  . UMBILICAL HERNIA REPAIR N/A 11/06/2015   Procedure: HERNIA REPAIR UMBILICAL ADULT;  Surgeon: [name], MD;  Location: [facility] ORS;  Service: General;  Laterality: N/A;    Social History:  reports that she has been smoking cigarettes. She has a 22.50 pack-year smoking history. She has never used smokeless tobacco. She reports previous alcohol use. She reports that she does not use drugs.  No Known Allergies  Family History  Problem Relation Age of Onset  . Stroke Mother   . Heart disease Mother   . [name] Father   . Breast cancer Sister 75  . Kidney disease Neg Hx   . Bladder Cancer Neg Hx   . Kidney cancer Neg Hx   . Prostate cancer Neg Hx      Prior to Admission medications   Medication Sig Start Date End Date Taking? Authorizing Provider  cyclobenzaprine (FLEXERIL) 5 MG tablet Take 1 tablet (5 mg total) by mouth 3 (three) times daily as needed for muscle spasms. 09/03/19 03/01/20  [name], MD  escitalopram (LEXAPRO) 10 MG tablet Take 10 mg by mouth daily.  11/09/17   [provider]  furosemide (LASIX) 40 MG tablet Take 40 mg by mouth 2 (two) times daily.  10/13/15   [provider]  LINZESS 145 MCG CAPS capsule Take 145 mcg by mouth every morning. 05/07/19   [provider]  metFORMIN (GLUCOPHAGE) 850 MG tablet Take 850 mg by mouth 2 (two) times daily. 11/01/18   [provider]  Multiple Vitamin (MULTIVITAMIN WITH MINERALS) TABS tablet Take 1 tablet by mouth daily.    [provider]  Oxycodone HCl 20 MG TABS Take 1 tablet (20 mg total) by mouth every 6 (six) hours. Must last 30 days 12/08/19 01/07/20  [name], MD  Oxycodone HCl 20 MG TABS Take 1 tablet (20 mg total) by mouth every 6 (six) hours. Must last 30 days 01/07/20 02/06/20  [name], MD  Oxycodone HCl 20 MG TABS Take 1 tablet (20 mg total) by mouth every 6 (six) hours. Must last 30 days 02/06/20  03/07/20  [name], MD  OZEMPIC 1 MG/DOSE SOPN Inject 1 mg into the skin every Tuesday.    [provider]  pantoprazole (PROTONIX) 40 MG tablet Take 40 mg by mouth 2 (two) times daily.  07/29/16   [provider]  pregabalin (LYRICA) 200 MG capsule Take 200 mg by mouth 2 (two) times daily.    [provider]  rosuvastatin (CRESTOR) 5 MG tablet Take 5 mg by mouth at bedtime. 05/07/19   [provider]  spironolactone (ALDACTONE) 50 MG tablet Take 100 mg by mouth daily.  09/08/16   [provider]  TRESIBA FLEXTOUCH 200 UNIT/ML FlexTouch Pen SMARTSIG:10 Unit(s) SUB-Q Daily 05/07/19   [provider]    Physical Exam: Vitals:   12/16/19 1705 12/16/19 1800 12/16/19 1945 12/16/19 2000  BP:  114/63    Pulse:  (!) 102 (!) 118 (!) 113  Resp:  13 (!) 24 (!) 23  Temp:      TempSrc:      SpO2:  98% 95% 91%  Weight: 81.6 kg     Height: _0  (1.753 m)      Constitutional: Ill-appearing woman resting supine in bed, NAD, calm Eyes: PERRL, lids and conjunctivae normal ENMT: Mucous membranes are moist. Posterior pharynx clear of any exudate or lesions. Neck: normal, supple, no masses. Respiratory: clear to auscultation bilaterally, no wheezing, no crackles. Normal respiratory effort. No accessory muscle use.  Cardiovascular: Regular rate and rhythm, no murmurs / rubs / gallops.  Massive bilateral lower extremity edema all the way up to the abdomen. Abdomen: Large distended and tense abdomen with mild generalized tenderness.  Bowel sounds are positive. Musculoskeletal: no clubbing / cyanosis. No joint deformity upper and lower extremities. Good ROM of upper extremities, limited his lower extremities due to massive edema. Normal muscle tone.  Skin: no rashes, lesions, ulcers. No induration Neurologic: CN 2-12 grossly intact. Sensation intact,Strength 5/5 in all 4.  Psychiatric: Alert and oriented x 3. Normal mood.    Labs on Admission: I have  personally reviewed following labs and imaging studies  CBC: Recent Labs  Lab 12/16/19 1659  WBC 9.4  HGB 9.0*  HCT 30.7*  MCV 93.6  PLT 967*   Basic Metabolic Panel: Recent Labs  Lab 12/16/19 1659  NA 129*  K 5.1  CL 97*  CO2 21*  GLUCOSE 183*  BUN <5*  CREATININE 0.48  CALCIUM 7.9*  GFR: Estimated Creatinine Clearance: 87.6 mL/min (by C-G formula based on SCr of 0.48 mg/dL). Liver Function Tests: Recent Labs  Lab 12/16/19 1659  AST 74*  ALT 26  ALKPHOS 110  BILITOT 1.1  PROT 6.7  ALBUMIN 2.7*   Recent Labs  Lab 12/16/19 1659  LIPASE 46   Recent Labs  Lab 12/16/19 1659  AMMONIA 55*   Coagulation Profile: Recent Labs  Lab 12/16/19 1659  INR 1.4*   Cardiac Enzymes: No results for input(s): CKTOTAL, CKMB, CKMBINDEX, TROPONINI in the last 168 hours. BNP (last 3 results) No results for input(s): PROBNP in the last 8760 hours. HbA1C: No results for input(s): HGBA1C in the last 72 hours. CBG: No results for input(s): GLUCAP in the last 168 hours. Lipid Profile: No results for input(s): CHOL, HDL, LDLCALC, TRIG, CHOLHDL, LDLDIRECT in the last 72 hours. Thyroid Function Tests: No results for input(s): TSH, T4TOTAL, FREET4, T3FREE, THYROIDAB in the last 72 hours. Anemia Panel: No results for input(s): VITAMINB12, FOLATE, FERRITIN, TIBC, IRON, RETICCTPCT in the last 72 hours. Urine analysis:    Component Value Date/Time   COLORURINE YELLOW (A) 04/23/2017 1140   APPEARANCEUR CLOUDY (A) 04/23/2017 1140   APPEARANCEUR Hazy 01/06/2014 2238   LABSPEC 1.006 04/23/2017 1140   LABSPEC 1.015 01/06/2014 2238   PHURINE 6.0 04/23/2017 1140   GLUCOSEU NEGATIVE 04/23/2017 1140   GLUCOSEU Negative 01/06/2014 2238   HGBUR NEGATIVE 04/23/2017 1140   BILIRUBINUR NEGATIVE 04/23/2017 1140   BILIRUBINUR Negative 01/06/2014 2238   KETONESUR NEGATIVE 04/23/2017 1140   PROTEINUR NEGATIVE 04/23/2017 1140   NITRITE NEGATIVE 04/23/2017 1140   LEUKOCYTESUR NEGATIVE  04/23/2017 1140   LEUKOCYTESUR Negative 01/06/2014 2238    Radiological Exams on Admission: DG Abdomen Acute W/Chest  Result Date: 12/16/2019 CLINICAL DATA:  Abdominal pain and shortness of breath EXAM: X-RAY ABDOMEN acute with one-view chest COMPARISON:  None. FINDINGS: There is no evidence of dilated bowel loops or free intraperitoneal air. No radiopaque calculi or other significant radiographic abnormality is seen. Heart size and mediastinal contours are within normal limits. Both lungs are clear aside from right midlung atelectasis. IMPRESSION: Negative. Electronically Signed   By: Ulyses Jarred M.D.   On: 12/16/2019 19:43    EKG: Not performed.  Assessment/Plan Principal Problem:   Alcoholic cirrhosis of liver with ascites (HCC) Active Problems:   Hypertension associated with diabetes (HCC)   Anemia   Thrombocytopenia (HCC)   Hyponatremia   Type 2 diabetes mellitus (HCC)   Hyperlipidemia associated with type 2 diabetes mellitus (HCC)   Alcohol use disorder, severe, dependence (HCC)  Audrey Garrett is a 59 y.o. female with medical history significant for alcoholic liver cirrhosis, lung cancer currently on active surveillance with prior SBRT to RUL lung nodule, hepatitis C s/p Harvoni treatment, type 2 diabetes, hypertension, hyperlipidemia, anxiety, iron deficiency anemia, chronic pain, tobacco use, and alcohol use who is admitted with anasarca due to decompensated alcoholic liver cirrhosis.  Anasarca due to decompensated alcoholic liver cirrhosis with ascites: Significantly distended and tense abdomen on admission.  She was given IV Lasix 40 mg once in the ED.  She is currently mentating well. -Ultrasound paracentesis ordered for a.m., IV albumin ordered if needed removal of >5 L of ascitic fluid -Continue home spironolactone 100 mg daily and Lasix 40 mg twice daily -Monitor strict I/O's and daily weights -Continue to monitor LFTs, INR  Hyponatremia: Mild with sodium 129 on  admission in setting of cirrhosis.  Continue to monitor with management as above.  Alcohol use disorder: Reports ongoing alcohol use of 6 beers per day.  Place on CIWA protocol with as needed Ativan for signs of withdrawal.  Alcohol cessation advised to patient.  Anemia of iron deficiency and chronic liver disease: Hemoglobin 9.0 on admission without obvious bleeding.  Continue to monitor.  Thrombocytopenia: Mild without obvious bleeding, secondary to cirrhosis.  Continue to monitor.  Hypertension: Stable, continue spironolactone and Lasix as above.  Hyperlipidemia: Holding rosuvastatin for now.  Depression/anxiety: Continue Lexapro.  Lung cancer: S/p P SBRT for RUL lung nodule, currently on active surveillance for known lung nodules.  Follows with oncology, Dr. Janese Banks.  Chronic pain syndrome: Follows with pain management for chronic back pain and cancer related pain. Continue home oxycodone 20 mg q6h prn.  IV Dilaudid 1 mg if needed in hospital for severe pain.  Tobacco use: Patient states she is currently smoking 1 pack/day.  Smoking cessation advised.  She declines nicotine patch.  Goals of care: Patient tells me that she does not want to pursue any chemotherapy or radiation if warranted for lung cancer or other aggressive treatments.  She says she has a living will and that her CODE STATUS is DNR.  She tells me her goals are to maximize comfort and quality of life.  She is agreeable for therapeutic paracentesis.  She is interested in discussing further with palliative medicine.  DVT prophylaxis: SCDs pending paracentesis Code Status: DNR, confirmed with patient Family Communication: Discussed with patient's son at bedside Disposition Plan: From home, anticipate discharge to home pending paracentesis and adequate symptomatic improvement. Consults called: None Admission status:  Status is: Inpatient  Remains inpatient appropriate because:Ongoing diagnostic testing needed not  appropriate for outpatient work up and IV treatments appropriate due to intensity of illness or inability to take PO   Dispo: The patient is from: Home              Anticipated d/c is to: Home              Anticipated d/c date is: 2 days              Patient currently is not medically stable to d/c.    Zada Finders MD [facility]  If 7PM-7AM, please contact night-coverage [email or web address]  12/16/2019, 9:44 PM

## 2019-12-16 NOTE — ED Triage Notes (Signed)
Pt via EMS from home. Pt c/o upper abdominal pain. Pt has a hx of cirrhosis and had fluid drawn off years ago. On assessment, pt is A&Ox4 and NAD. Pt gross ascites noted. Pt is on the transplant list at [facility] but still drinks daily.

## 2019-12-16 NOTE — ED Notes (Signed)
Pt returned from imaging.

## 2019-12-17 ENCOUNTER — Inpatient Hospital Stay: Payer: Medicare Other

## 2019-12-17 ENCOUNTER — Encounter: Payer: Self-pay | Admitting: Internal Medicine

## 2019-12-17 DIAGNOSIS — K7031 Alcoholic cirrhosis of liver with ascites: Secondary | ICD-10-CM | POA: Diagnosis not present

## 2019-12-17 DIAGNOSIS — E871 Hypo-osmolality and hyponatremia: Secondary | ICD-10-CM | POA: Diagnosis not present

## 2019-12-17 DIAGNOSIS — D696 Thrombocytopenia, unspecified: Secondary | ICD-10-CM | POA: Diagnosis not present

## 2019-12-17 LAB — BODY FLUID CELL COUNT WITH DIFFERENTIAL
Eos, Fluid: 0 %
Lymphs, Fluid: 4 %
Monocyte-Macrophage-Serous Fluid: 42 %
Neutrophil Count, Fluid: 54 %
Total Nucleated Cell Count, Fluid: 128 cu mm

## 2019-12-17 LAB — COMPREHENSIVE METABOLIC PANEL
ALT: 23 U/L (ref 0–44)
AST: 63 U/L — ABNORMAL HIGH (ref 15–41)
Albumin: 2.5 g/dL — ABNORMAL LOW (ref 3.5–5.0)
Alkaline Phosphatase: 95 U/L (ref 38–126)
Anion gap: 6 (ref 5–15)
BUN: 5 mg/dL — ABNORMAL LOW (ref 6–20)
CO2: 26 mmol/L (ref 22–32)
Calcium: 7.8 mg/dL — ABNORMAL LOW (ref 8.9–10.3)
Chloride: 98 mmol/L (ref 98–111)
Creatinine, Ser: 0.46 mg/dL (ref 0.44–1.00)
GFR calc Af Amer: >60 mL/min (ref >60)
GFR calc non Af Amer: >60 mL/min (ref >60)
Glucose, Bld: 175 mg/dL — ABNORMAL HIGH (ref 70–99)
Potassium: 4.6 mmol/L (ref 3.5–5.1)
Sodium: 130 mmol/L — ABNORMAL LOW (ref 135–145)
Total Bilirubin: 1.3 mg/dL — ABNORMAL HIGH (ref 0.3–1.2)
Total Protein: 6.1 g/dL — ABNORMAL LOW (ref 6.5–8.1)

## 2019-12-17 LAB — URINALYSIS, COMPLETE (UACMP) WITH MICROSCOPIC
Bilirubin Urine: NEGATIVE
Glucose, UA: NEGATIVE mg/dL
Hgb urine dipstick: NEGATIVE
Ketones, ur: NEGATIVE mg/dL
Leukocytes,Ua: NEGATIVE
Nitrite: NEGATIVE
Protein, ur: NEGATIVE mg/dL
Specific Gravity, Urine: 1.009 (ref 1.005–1.030)
pH: 6 (ref 5.0–8.0)

## 2019-12-17 LAB — ETHANOL: Alcohol, Ethyl (B): <10 mg/dL (ref <10)

## 2019-12-17 LAB — PATHOLOGIST SMEAR REVIEW

## 2019-12-17 LAB — CBC
HCT: 25.6 % — ABNORMAL LOW (ref 36.0–46.0)
Hemoglobin: 8 g/dL — ABNORMAL LOW (ref 12.0–15.0)
MCH: 28.4 pg (ref 26.0–34.0)
MCHC: 31.3 g/dL (ref 30.0–36.0)
MCV: 90.8 fL (ref 80.0–100.0)
Platelets: 120 10*3/uL — ABNORMAL LOW (ref 150–400)
RBC: 2.82 MIL/uL — ABNORMAL LOW (ref 3.87–5.11)
RDW: 23.9 % — ABNORMAL HIGH (ref 11.5–15.5)
WBC: 6.3 10*3/uL (ref 4.0–10.5)
nRBC: 0 % (ref 0.0–0.2)

## 2019-12-17 LAB — PROTIME-INR
INR: 1.4 — ABNORMAL HIGH (ref 0.8–1.2)
Prothrombin Time: 16.3 seconds — ABNORMAL HIGH (ref 11.4–15.2)

## 2019-12-17 LAB — HIV ANTIBODY (ROUTINE TESTING W REFLEX): HIV Screen 4th Generation wRfx: NONREACTIVE

## 2019-12-17 LAB — GLUCOSE, CAPILLARY: Glucose-Capillary: 175 mg/dL — ABNORMAL HIGH (ref 70–99)

## 2019-12-17 LAB — HEMOGLOBIN A1C
Hgb A1c MFr Bld: 7 % — ABNORMAL HIGH (ref 4.8–5.6)
Mean Plasma Glucose: 154.2 mg/dL

## 2019-12-17 MED ORDER — PANTOPRAZOLE SODIUM 40 MG PO TBEC
40.0000 mg | DELAYED_RELEASE_TABLET | Freq: Two times a day (BID) | ORAL | Status: DC
  Administered 2019-12-17: 40 mg via ORAL
  Filled 2019-12-17: qty 1

## 2019-12-17 MED ORDER — FUROSEMIDE 10 MG/ML IJ SOLN: 40.0000 mg | Freq: Two times a day (BID) | INTRAMUSCULAR | Status: DC

## 2019-12-17 MED ORDER — PREGABALIN 75 MG PO CAPS: 225.0000 mg | ORAL_CAPSULE | Freq: Two times a day (BID) | ORAL | Status: DC

## 2019-12-17 MED ORDER — ESCITALOPRAM OXALATE 10 MG PO TABS: 20.0000 mg | ORAL_TABLET | Freq: Every day | ORAL | Status: DC

## 2019-12-17 MED ORDER — ESCITALOPRAM OXALATE 10 MG PO TABS
10.0000 mg | ORAL_TABLET | Freq: Once | ORAL | Status: AC
  Administered 2019-12-17: 10 mg via ORAL
  Filled 2019-12-17: qty 1

## 2019-12-17 MED ORDER — INSULIN ASPART 100 UNIT/ML ~~LOC~~ SOLN: 0.0000 [IU] | Freq: Every day | SUBCUTANEOUS | Status: DC

## 2019-12-17 MED ORDER — ROSUVASTATIN CALCIUM 10 MG PO TABS: 5.0000 mg | ORAL_TABLET | Freq: Every day | ORAL | Status: DC

## 2019-12-17 MED ORDER — INSULIN ASPART 100 UNIT/ML ~~LOC~~ SOLN
0.0000 [IU] | Freq: Three times a day (TID) | SUBCUTANEOUS | Status: DC
  Administered 2019-12-17: 3 [IU] via SUBCUTANEOUS
  Filled 2019-12-17 (×2): qty 1

## 2019-12-17 MED ORDER — CYCLOBENZAPRINE HCL 10 MG PO TABS: 5.0000 mg | ORAL_TABLET | Freq: Three times a day (TID) | ORAL | Status: DC | PRN

## 2019-12-17 MED ORDER — OXYCODONE HCL 20 MG PO TABS: 1.0000 | ORAL_TABLET | Freq: Four times a day (QID) | ORAL | Status: DC | PRN

## 2019-12-17 MED ORDER — PREGABALIN 75 MG PO CAPS
200.0000 mg | ORAL_CAPSULE | Freq: Two times a day (BID) | ORAL | Status: DC
  Administered 2019-12-17: 200 mg via ORAL
  Filled 2019-12-17: qty 1

## 2019-12-17 NOTE — Procedures (Signed)
Ultrasound-guided diagnostic and therapeutic paracentesis performed yielding 3 liters of straw colored fluid.  Fluid was sent to lab for analysis. No immediate complications. EBL is < 2 ml.

## 2019-12-17 NOTE — Discharge Summary (Signed)
Physician Discharge Summary  Audrey Garrett ZES:[id number] DOB: October 09, 1960 DOA: 12/16/2019  PCP: Jodi Marble, MD  Admit date: 12/16/2019 Discharge date: 12/17/2019  Discharge disposition: Home   Recommendations for Outpatient Follow-Up:   Follow up with hospice team at home   Discharge Diagnosis:   Principal Problem:   Alcoholic cirrhosis of liver with ascites ([place]) Active Problems:   Hypertension associated with diabetes ([place])   Anemia   Thrombocytopenia (HCC)   Hyponatremia   Type 2 diabetes mellitus ([place])   Goals of care, counseling/discussion   Hyperlipidemia associated with type 2 diabetes mellitus ([place])   Alcohol use disorder, severe, dependence ([place])    Discharge Condition: Stable.  Diet recommendation:  Diet Order            Diet - low sodium heart healthy           Diet Heart Room service appropriate? Yes; Fluid consistency: Thin  Diet effective now                   Code Status: DNR     Hospital Course:   [name] is a 59 y.o. female with medical history significant for alcoholic liver cirrhosis, lung cancer currently on active surveillance with prior SBRT to RUL lung nodule, hepatitis C s/p Harvoni treatment, type 2 diabetes, hypertension, hyperlipidemia, anxiety, iron deficiency anemia, chronic pain, tobacco use, and alcohol use disorder who presented to the ED for evaluation of progressive bilateral leg swelling, abdominal pain and swelling.  She was admitted to the hospital for decompensated alcoholic liver cirrhosis with ascites/anasarca.  She was treated with IV Lasix.  She underwent paracentesis with removal of 3 L of ascitic fluid.  She said she was "tired of doing this" and requested that treatment be discontinued.  She expressed interest in hospice so she was evaluated by the hospice team.  She was deemed to be a candidate for hospice services at home.  Plan was discussed with the patient and her son at the bedside and they agreed  to enroll in hospice.  She will be discharged home with hospice.      Discharge Exam:    Vitals:   12/17/19 0733 12/17/19 0900 12/17/19 1131 12/17/19 1206  BP: 108/61 (!) 100/58 109/64 114/70  Pulse: 86 72 84   Resp:  14    Temp: 98.6 F (37 C) 98.7 F (37.1 C) 98.7 F (37.1 C)   TempSrc: Oral Oral Oral   SpO2: 99% 98% 97% 98%  Weight:      Height:         GEN: NAD SKIN: Spider Nevi on anterior chest EYES: EOMI ENT: MMM CV: RRR PULM: CTA B ABD: soft, grossly distended, NT, +BS CNS: AAO x 3, non focal EXT: Significant bilateral leg edema, no tenderness    The results of significant diagnostics from this hospitalization (including imaging, microbiology, ancillary and laboratory) are listed below for reference.     Procedures and Diagnostic Studies:   US Paracentesis  Result Date: 12/17/2019 INDICATION: Patient with history of alcoholic cirrhosis with recurrent ascites. Request is for therapeutic and diagnostic paracentesis EXAM: ULTRASOUND GUIDED THERAPEUTIC AND DIAGNOSTIC PARACENTESIS MEDICATIONS: Lidocaine 1% 10 mL COMPLICATIONS: None immediate. PROCEDURE: Informed written consent was obtained from the patient after a discussion of the risks, benefits and alternatives to treatment. A timeout was performed prior to the initiation of the procedure. Initial ultrasound scanning demonstrates a moderate amount of ascites within the right lower abdominal quadrant. The  right lower abdomen was prepped and draped in the usual sterile fashion. 1% lidocaine was used for local anesthesia. Following this, a 6 Fr Safe-T-Centesis catheter was introduced. An ultrasound image was saved for documentation purposes. The paracentesis was performed. The catheter was removed and a dressing was applied. The patient tolerated the procedure well without immediate post procedural complication. Patient received post-procedure intravenous albumin; see nursing notes for details. FINDINGS: A total of  approximately 3 L of straw-colored fluid was removed. Samples were sent to the laboratory as requested by the clinical team. IMPRESSION: Successful ultrasound-guided therapeutic and diagnostic paracentesis yielding 3 liters of peritoneal fluid. Read by: Rushie Nyhan, NP Electronically Signed   By: Jerilynn Mages.  Shick M.D.   On: 12/17/2019 12:32   DG Abdomen Acute W/Chest  Result Date: 12/16/2019 CLINICAL DATA:  Abdominal pain and shortness of breath EXAM: X-RAY ABDOMEN acute with one-view chest COMPARISON:  None. FINDINGS: There is no evidence of dilated bowel loops or free intraperitoneal air. No radiopaque calculi or other significant radiographic abnormality is seen. Heart size and mediastinal contours are within normal limits. Both lungs are clear aside from right midlung atelectasis. IMPRESSION: Negative. Electronically Signed   By: Ulyses Jarred M.D.   On: 12/16/2019 19:43     Labs:   Basic Metabolic Panel: Recent Labs  Lab 12/16/19 1659 12/17/19 0632  NA 129* 130*  K 5.1 4.6  CL 97* 98  CO2 21* 26  GLUCOSE 183* 175*  BUN <5* 5*  CREATININE 0.48 0.46  CALCIUM 7.9* 7.8*   GFR Estimated Creatinine Clearance: 103 mL/min (by C-G formula based on SCr of 0.46 mg/dL). Liver Function Tests: Recent Labs  Lab 12/16/19 1659 12/17/19 0632  AST 74* 63*  ALT 26 23  ALKPHOS 110 95  BILITOT 1.1 1.3*  PROT 6.7 6.1*  ALBUMIN 2.7* 2.5*   Recent Labs  Lab 12/16/19 1659  LIPASE 46   Recent Labs  Lab 12/16/19 1659  AMMONIA 55*   Coagulation profile Recent Labs  Lab 12/16/19 1659 12/17/19 0632  INR 1.4* 1.4*    CBC: Recent Labs  Lab 12/16/19 1659 12/17/19 0632  WBC 9.4 6.3  HGB 9.0* 8.0*  HCT 30.7* 25.6*  MCV 93.6 90.8  PLT 130* 120*   Cardiac Enzymes: No results for input(s): CKTOTAL, CKMB, CKMBINDEX, TROPONINI in the last 168 hours. BNP: Invalid input(s): POCBNP CBG: Recent Labs  Lab 12/17/19 1127  GLUCAP 175*   D-Dimer No results for input(s): DDIMER in the  last 72 hours. Hgb A1c No results for input(s): HGBA1C in the last 72 hours. Lipid Profile No results for input(s): CHOL, HDL, LDLCALC, TRIG, CHOLHDL, LDLDIRECT in the last 72 hours. Thyroid function studies No results for input(s): TSH, T4TOTAL, T3FREE, THYROIDAB in the last 72 hours.  Invalid input(s): FREET3 Anemia work up No results for input(s): VITAMINB12, FOLATE, FERRITIN, TIBC, IRON, RETICCTPCT in the last 72 hours. Microbiology Recent Results (from the past 240 hour(s))  SARS Coronavirus 2 by RT PCR (hospital order, performed in [facility] hospital lab) Nasopharyngeal Nasopharyngeal Swab     Status: None   Collection Time: 12/16/19  8:14 PM   Specimen: Nasopharyngeal Swab  Result Value Ref Range Status   SARS Coronavirus 2 NEGATIVE NEGATIVE Final    Comment: (NOTE) SARS-CoV-2 target nucleic acids are NOT DETECTED.  The SARS-CoV-2 RNA is generally detectable in upper and lower respiratory specimens during the acute phase of infection. The lowest concentration of SARS-CoV-2 viral copies this assay can detect is 250  copies / mL. A negative result does not preclude SARS-CoV-2 infection and should not be used as the sole basis for treatment or other patient management decisions.  A negative result may occur with improper specimen collection / handling, submission of specimen other than nasopharyngeal swab, presence of viral mutation(s) within the areas targeted by this assay, and inadequate number of viral copies (<250 copies / mL). A negative result must be combined with clinical observations, patient history, and epidemiological information.  Fact Sheet for Patients:   StrictlyIdeas.no  Fact Sheet for Healthcare Providers: BankingDealers.co.za  This test is not yet approved or  cleared by the [place] FDA and has been authorized for detection and/or diagnosis of SARS-CoV-2 by FDA under an Emergency Use Authorization  (EUA).  This EUA will remain in effect (meaning this test can be used) for the duration of the COVID-19 declaration under Section 564(b)(1) of the Act, 21 U.S.C. section 360bbb-3(b)(1), unless the authorization is terminated or revoked sooner.  Performed at [facility], [address], [place], [place] [address]      Discharge Instructions:   Discharge Instructions    Diet - low sodium heart healthy   Complete by: As directed    Discharge instructions   Complete by: As directed    Follow-up with hospice team within 24 hours of discharge   Discharge wound care:   Complete by: As directed    Keep wound on the right elbow clean and dry   Increase activity slowly   Complete by: As directed      Allergies as of 12/17/2019   No Known Allergies     Medication List    STOP taking these medications   multivitamin with minerals Tabs tablet     TAKE these medications   cyclobenzaprine 5 MG tablet Commonly known as: FLEXERIL Take 1 tablet (5 mg total) by mouth 3 (three) times daily as needed for muscle spasms.   escitalopram 20 MG tablet Commonly known as: LEXAPRO Take 20 mg by mouth daily.   furosemide 40 MG tablet Commonly known as: LASIX Take 40 mg by mouth 2 (two) times daily.   Linzess 145 MCG Caps capsule Generic drug: linaclotide Take 145 mcg by mouth every morning.   metFORMIN 850 MG tablet Commonly known as: GLUCOPHAGE Take 850 mg by mouth 2 (two) times daily.   Oxycodone HCl 20 MG Tabs Take 1 tablet (20 mg total) by mouth every 6 (six) hours. Must last 30 days   Oxycodone HCl 20 MG Tabs Take 1 tablet (20 mg total) by mouth every 6 (six) hours. Must last 30 days Start taking on: January 07, 2020   Oxycodone HCl 20 MG Tabs Take 1 tablet (20 mg total) by mouth every 6 (six) hours. Must last 30 days Start taking on: February 06, 2020   Ozempic (1 MG/DOSE) 2 MG/1.5ML Sopn Generic drug: Semaglutide (1 MG/DOSE) Inject 1 mg into the skin every  Tuesday.   pantoprazole 40 MG tablet Commonly known as: PROTONIX Take 40 mg by mouth 2 (two) times daily.   pregabalin 225 MG capsule Commonly known as: LYRICA Take 225 mg by mouth 2 (two) times daily.   rosuvastatin 5 MG tablet Commonly known as: CRESTOR Take 5 mg by mouth at bedtime.   spironolactone 50 MG tablet Commonly known as: ALDACTONE Take 50 mg by mouth daily.   Tyler Aas FlexTouch 200 UNIT/ML FlexTouch Pen Generic drug: insulin degludec SMARTSIG:10 Unit(s) SUB-Q Daily  Discharge Care Instructions  (From admission, onward)         Start     Ordered   12/17/19 0000  Discharge wound care:       Comments: Keep wound on the right elbow clean and dry   12/17/19 1549            Time coordinating discharge: 40 minutres  Signed:  [place]  [facility] 12/17/2019, 3:56 PM   Pager on [email or web address]. If 7PM-7AM, please contact night-coverage at [email or web address]

## 2019-12-17 NOTE — Progress Notes (Signed)
Manufacturing engineer hospital liaison note:  New referral for [facility] hospice services at home receive from [place]. Patient information sent to referral. Hospice eligibility pending hospice physician review.  Writer met in the room with patient and her son Sharia Reeve to initiate education regarding hospice services, philosophy and team approach to care with understanding voiced. Questions answered. Hospice contact numbers given to patient.  Plan is for patient to discharge home today via [facility] transport. Signed out of facility DNR to accompany patient at discharge.  Thank you for the opportunity to be involved in the care of this patient and her family.  Flo Shanks BSN, RN, [place] [phone]

## 2019-12-17 NOTE — Treatment Plan (Signed)
Patient educated about AVS placed in packet for EMS at this time, IV removed. Pt denies questions at this time. Son at bedside.

## 2019-12-17 NOTE — Progress Notes (Addendum)
Progress Note    Audrey Garrett  CBJ:[id number] DOB: February 16, 1961  DOA: 12/16/2019 PCP: Jodi Marble, MD      Brief Narrative:    Medical records reviewed and are as summarized below:  Audrey Garrett is a 59 y.o. female       Assessment/Plan:   Principal Problem:   Alcoholic cirrhosis of liver with ascites ([place]) Active Problems:   Hypertension associated with diabetes ([place])   Anemia   Thrombocytopenia (HCC)   Hyponatremia   Type 2 diabetes mellitus ([place])   Goals of care, counseling/discussion   Hyperlipidemia associated with type 2 diabetes mellitus ([place])   Alcohol use disorder, severe, dependence ([place])  Body mass index is 36.95 kg/m.  (Morbid obesity)   PLAN  Treat anasarca with IV Lasix.  Continue Aldactone Plan for ultrasound-guided paracentesis today because of significant ascites Analgesics as needed for pain Ativan as needed for alcohol withdrawal Hold Ozempic and Tresiba for now.  Use Humalog as needed for hyperglycemia. She has chronic hyponatremia from liver cirrhosis She has chronic thrombocytopenia from liver cirrhosis Monitor CBC and BMP Discussed goals of care.  She said she no longer wants to pursue treatment and she is interested in speaking to hospice to see what they have to offer.  Consulted hospice nurse    Diet Order            Diet Heart Room service appropriate? Yes; Fluid consistency: Thin  Diet effective now                    Consultants:  Interventional radiologist  Procedures:  Plan for paracentesis today    Medications:   . escitalopram  10 mg Oral Daily  . folic acid  1 mg Oral Daily  . furosemide  40 mg Intravenous BID  . insulin aspart  0-15 Units Subcutaneous TID WC  . insulin aspart  0-5 Units Subcutaneous QHS  . linaclotide  145 mcg Oral q morning - 10a  . multivitamin with minerals  1 tablet Oral Daily  . pantoprazole  40 mg Oral BID  . pregabalin  200 mg Oral BID  . rosuvastatin  5 mg Oral  QHS  . spironolactone  100 mg Oral Daily  . thiamine  100 mg Oral Daily   Or  . thiamine  100 mg Intravenous Daily   Continuous Infusions: . albumin human       Anti-infectives (From admission, onward)   None             Family Communication/Anticipated D/C date and plan/Code Status   DVT prophylaxis: SCDs Start: 12/16/19 2210     Code Status: DNR  Family Communication: Plan discussed with her son on the bedside Disposition Plan:    Status is: Inpatient  Remains inpatient appropriate because:IV treatments appropriate due to intensity of illness or inability to take PO and Inpatient level of care appropriate due to severity of illness   Dispo: The patient is from: Home              Anticipated d/c is to: Home              Anticipated d/c date is: 2 days              Patient currently is not medically stable to d/c.           Subjective:   C/o abdominal pain and abdominal swelling.  She gets some relief with IV  Dilaudid.  Objective:    Vitals:   12/17/19 0414 12/17/19 0500 12/17/19 0733 12/17/19 0900  BP: 106/74  108/61 (!) 100/58  Pulse: 91  86 72  Resp: 15   14  Temp: 97.7 F (36.5 C)  98.6 F (37 C) 98.7 F (37.1 C)  TempSrc: Oral  Oral Oral  SpO2: 98%  99% 98%  Weight:  113.5 kg    Height:       No data found.   Intake/Output Summary (Last 24 hours) at 12/17/2019 1057 Last data filed at 12/17/2019 0758 Gross per 24 hour  Intake --  Output 1120 ml  Net -1120 ml   Filed Weights   12/16/19 1705 12/17/19 0500  Weight: 81.6 kg 113.5 kg    Exam:  GEN: NAD SKIN: Spider Nevi on anterior chest EYES: EOMI ENT: MMM CV: RRR PULM: CTA B ABD: soft, grossly distended, NT, +BS CNS: AAO x 3, non focal EXT: Significant bilateral leg edema, no tenderness   Data Reviewed:   I have personally reviewed following labs and imaging studies:  Labs: Labs show the following:   Basic Metabolic Panel: Recent Labs  Lab 12/16/19 1659  12/17/19 0632  NA 129* 130*  K 5.1 4.6  CL 97* 98  CO2 21* 26  GLUCOSE 183* 175*  BUN <5* 5*  CREATININE 0.48 0.46  CALCIUM 7.9* 7.8*   GFR Estimated Creatinine Clearance: 103 mL/min (by C-G formula based on SCr of 0.46 mg/dL). Liver Function Tests: Recent Labs  Lab 12/16/19 1659 12/17/19 0632  AST 74* 63*  ALT 26 23  ALKPHOS 110 95  BILITOT 1.1 1.3*  PROT 6.7 6.1*  ALBUMIN 2.7* 2.5*   Recent Labs  Lab 12/16/19 1659  LIPASE 46   Recent Labs  Lab 12/16/19 1659  AMMONIA 55*   Coagulation profile Recent Labs  Lab 12/16/19 1659 12/17/19 0632  INR 1.4* 1.4*    CBC: Recent Labs  Lab 12/16/19 1659 12/17/19 0632  WBC 9.4 6.3  HGB 9.0* 8.0*  HCT 30.7* 25.6*  MCV 93.6 90.8  PLT 130* 120*   Cardiac Enzymes: No results for input(s): CKTOTAL, CKMB, CKMBINDEX, TROPONINI in the last 168 hours. BNP (last 3 results) No results for input(s): PROBNP in the last 8760 hours. CBG: No results for input(s): GLUCAP in the last 168 hours. D-Dimer: No results for input(s): DDIMER in the last 72 hours. Hgb A1c: No results for input(s): HGBA1C in the last 72 hours. Lipid Profile: No results for input(s): CHOL, HDL, LDLCALC, TRIG, CHOLHDL, LDLDIRECT in the last 72 hours. Thyroid function studies: No results for input(s): TSH, T4TOTAL, T3FREE, THYROIDAB in the last 72 hours.  Invalid input(s): FREET3 Anemia work up: No results for input(s): VITAMINB12, FOLATE, FERRITIN, TIBC, IRON, RETICCTPCT in the last 72 hours. Sepsis Labs: Recent Labs  Lab 12/16/19 1659 12/17/19 0632  WBC 9.4 6.3    Microbiology Recent Results (from the past 240 hour(s))  SARS Coronavirus 2 by RT PCR (hospital order, performed in [facility] hospital lab) Nasopharyngeal Nasopharyngeal Swab     Status: None   Collection Time: 12/16/19  8:14 PM   Specimen: Nasopharyngeal Swab  Result Value Ref Range Status   SARS Coronavirus 2 NEGATIVE NEGATIVE Final    Comment: (NOTE) SARS-CoV-2 target  nucleic acids are NOT DETECTED.  The SARS-CoV-2 RNA is generally detectable in upper and lower respiratory specimens during the acute phase of infection. The lowest concentration of SARS-CoV-2 viral copies this assay can detect is 250 copies /  mL. A negative result does not preclude SARS-CoV-2 infection and should not be used as the sole basis for treatment or other patient management decisions.  A negative result may occur with improper specimen collection / handling, submission of specimen other than nasopharyngeal swab, presence of viral mutation(s) within the areas targeted by this assay, and inadequate number of viral copies (<250 copies / mL). A negative result must be combined with clinical observations, patient history, and epidemiological information.  Fact Sheet for Patients:   StrictlyIdeas.no  Fact Sheet for Healthcare Providers: BankingDealers.co.za  This test is not yet approved or  cleared by the [place] FDA and has been authorized for detection and/or diagnosis of SARS-CoV-2 by FDA under an Emergency Use Authorization (EUA).  This EUA will remain in effect (meaning this test can be used) for the duration of the COVID-19 declaration under Section 564(b)(1) of the Act, 21 U.S.C. section 360bbb-3(b)(1), unless the authorization is terminated or revoked sooner.  Performed at [facility], [address] [place], [place] [address]     Procedures and diagnostic studies:  DG Abdomen Acute W/Chest  Result Date: 12/16/2019 CLINICAL DATA:  Abdominal pain and shortness of breath EXAM: X-RAY ABDOMEN acute with one-view chest COMPARISON:  None. FINDINGS: There is no evidence of dilated bowel loops or free intraperitoneal air. No radiopaque calculi or other significant radiographic abnormality is seen. Heart size and mediastinal contours are within normal limits. Both lungs are clear aside from right midlung  atelectasis. IMPRESSION: Negative. Electronically Signed   By: Ulyses Jarred M.D.   On: 12/16/2019 19:43               LOS: 1 day   [facility]   Pager on [email or web address]. If 7PM-7AM, please contact night-coverage at [email or web address]     12/17/2019, 10:57 AM

## 2019-12-17 NOTE — Care Management Obs Status (Signed)
[place] NOTIFICATION   Patient Details  Name: TARISSA KERIN MRN: [id number] Date of Birth: [birth year]   Medicare Observation Status Notification Given:  Yes    Beverly Sessions, RN 12/17/2019, 4:28 PM

## 2019-12-17 NOTE — TOC Initial Note (Signed)
Transition of Care [facility]) - Initial/Assessment Note    Patient Details  Name: Audrey Garrett MRN: [id number] Date of Birth: 1960/06/18  Transition of Care [facility]) CM/SW Contact:    Beverly Sessions, RN Phone Number: 12/17/2019, 3:54 PM  Clinical Narrative:                    Patient to discharge home today Son at bedside Patient lives at home with son and spouse.   PCP Tejan-sie.  [facility]  Denies issues obtaining medications  Patient states that she wears braces on her feet, has a cane, BSC, and WC in the home.  Patient to discharge home with hospice services.  Patient states that she does not have a preference of hospice agency.  Referral made to Santiago Glad with [employer].    Offered patient resources for alcohol use.  Patient politely declines resources.  States "I have drank all my life"  Patient states that she drinks 6-12 beers a night.   [facility] to transport patient home.  They will pick up at 5:30 pm DNR on chart EMS packet printed,  Bedside RN notified       Patient Goals and CMS Choice        Expected Discharge Plan and Services           Expected Discharge Date: 12/17/19                                    Prior Living Arrangements/Services                       Activities of Daily Living Home Assistive Devices/Equipment: Shower chair without back, Wheelchair ADL Screening (condition at time of admission) [name] cognitive ability adequate to safely complete daily activities?: Yes Is the patient deaf or have difficulty hearing?: No Does the patient have difficulty seeing, even when wearing glasses/contacts?: No Does the patient have difficulty concentrating, remembering, or making decisions?: No Patient able to express need for assistance with ADLs?: Yes Does the patient have difficulty dressing or bathing?: Yes Independently performs ADLs?: No Communication: Dependent Is this a change from baseline?:  Pre-admission baseline Dressing (OT): Dependent Is this a change from baseline?: Pre-admission baseline Grooming: Dependent Is this a change from baseline?: Pre-admission baseline Feeding: Independent Bathing: Dependent Is this a change from baseline?: Pre-admission baseline Toileting: Dependent Is this a change from baseline?: Pre-admission baseline In/Out Bed: Dependent Is this a change from baseline?: Pre-admission baseline Walks in Home: Dependent Is this a change from baseline?: Pre-admission baseline Does the patient have difficulty walking or climbing stairs?: Yes Weakness of Legs: Both Weakness of Arms/Hands: Both  Permission Sought/Granted                  Emotional Assessment              Admission diagnosis:  Anasarca [R15.4] Alcoholic cirrhosis of liver with ascites ([place]) [K70.31] Ascites [R18.8] Patient Active Problem List   Diagnosis Date Noted  . Hyperlipidemia associated with type 2 diabetes mellitus ([place]) 12/16/2019  . Alcohol use disorder, severe, dependence ([place]) 12/16/2019  . Ovarian cyst 08/08/2019  . Charcot's joint, right ankle and foot 01/26/2019  . Goals of care, counseling/discussion 12/11/2018  . Disorder of skeletal system 12/11/2018  . Problems influencing health status 12/11/2018  . DDD (degenerative disc disease), lumbosacral 12/11/2018  . Coccygodynia 12/11/2018  .  Cancer-related pain 12/10/2018  . Closed fracture of proximal end of left humerus 11/03/2018  . Charcot's joint of left foot 06/27/2018  . Dislocation of tarsal joint of right foot 05/09/2018  . Gastroesophageal reflux disease without esophagitis 05/09/2018  . H/O malignant neoplasm of lung 05/09/2018  . H/O renal cell cancer 05/09/2018  . Hyponatremia 05/09/2018  . Type 2 diabetes mellitus ([place]) 05/09/2018  . Anemia, iron deficiency 05/09/2018  . Essential hypertension 05/09/2018  . Lung nodule 05/09/2018  . Chronic left sacroiliac joint pain 04/20/2018  .  Swelling of left lower extremity 04/20/2018  . Iron deficiency anemia 02/03/2018  . Renal lesion 12/14/2017  . Malignant neoplasm of upper lobe of right lung ([place]) 12/02/2016  . Chronic groin pain, left 08/19/2016  . Personal history of tobacco use, presenting hazards to health 08/04/2016  . Osteoarthritis of hip (Bilateral) 07/12/2016  . Chronic hip pain (Bilateral) 07/07/2016  . Chronic hepatitis C without hepatic coma ([place]) 05/13/2016  . Bilateral leg edema 04/07/2016  . Opiate use 02/23/2016  . RLS (restless legs syndrome) 02/15/2016  . Iron deficiency anemia due to chronic blood loss 02/11/2016  . Controlled type 2 diabetes mellitus with hyperglycemia ([place]) 01/21/2016  . Acute blood loss anemia 01/21/2016  . Malnutrition of moderate degree 01/21/2016  . Chronic pain syndrome   . Hernia of anterior abdominal wall   . Incarcerated umbilical hernia   . Neurogenic pain 10/01/2015  . S/P thoracolumbar fusion (T5-L5) 10/01/2015  . Grade 1 Retrolisthesis (4 mm) of C5 over C6 and C6 over C7 10/01/2015  . Cervical foraminal stenosis (Left C5-6; Bilateral C6-7) 10/01/2015  . Acute GI bleeding 09/22/2015  . Depression, major, recurrent, moderate ([place]) 05/27/2015  . Abdominal pain, chronic, epigastric 05/27/2015  . Difficulty urinating 05/26/2015  . Nephrolithiasis 05/26/2015  . Alcoholic cirrhosis of liver with ascites ([place]) 05/26/2015  . Hypotension 05/22/2015  . Anemia 05/22/2015  . Thrombocytopenia ([place]) 05/22/2015  . Coagulopathy ([place]) 05/22/2015  . Hyperglycemia 05/22/2015  . Polyneuropathy 05/22/2015  . Urinary retention 05/22/2015  . Hepatic cirrhosis ([place]) 05/19/2015  . Cirrhosis of liver with ascites ([place]) 05/19/2015  . Hypomagnesemia (low magnesium levels) 04/07/2015  . At risk for osteopenia 04/03/2015  . Lumbar spondylosis 04/03/2015  . Chronic lumbar radicular pain (Left) 04/03/2015  . Chronic neck pain 04/03/2015  . Chronic lower extremity pain (2ry area of Pain)  (Left) 04/03/2015  . Chronic sacroiliac joint pain (Bilateral) (R>L) 04/03/2015  . Chronic upper back pain 04/03/2015  . Long term current use of opiate analgesic 04/03/2015  . Encounter for chronic pain management 04/03/2015  . Cervical radicular pain 01/01/2015  . Uncomplicated opioid dependence ([place]) 01/01/2015  . Long term prescription opiate use 01/01/2015  . 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 Lumbar facet syndrome (Bilateral) (L>R) 01/01/2015  . Osteoarthritis of spine with radiculopathy, lumbar region 01/01/2015  . Chronic musculoskeletal pain 01/01/2015  . Chronic low back pain (1ry area of Pain) (Bilateral) (L>R) 01/01/2015  . Insomnia, persistent 12/25/2014  . Hypertension associated with diabetes ([place]) 12/25/2014  . HCV (hepatitis C virus) 12/25/2014  . Depression, major, in remission ([place]) 12/25/2014   PCP:  Jodi Marble, MD Pharmacy:   [id number] [place], [place] [address] [place] [address] Phone: [phone] Fax: [phone]     Social Determinants of Health (SDOH) Interventions    Readmission Risk Interventions Readmission Risk Prevention Plan 12/17/2019  Transportation Screening Complete  PCP or Specialist Appt within 3-5 Days (  No Data)  HRI or Home Care Consult (No Data)  Palliative Care Screening Complete  Medication Review (RN Care Manager) Complete  Some recent data might be hidden

## 2019-12-17 NOTE — Care Management CC44 (Signed)
Condition Code 44 Documentation Completed  Patient Details  Name: Audrey Garrett MRN: [id number] Date of Birth: 1960/06/14   Condition Code 44 given:  Yes Patient signature on Condition Code 44 notice:  Yes Documentation of 2 MD's agreement:  Yes Code 44 added to claim:  Yes    Beverly Sessions, RN 12/17/2019, 4:28 PM

## 2019-12-20 LAB — BODY FLUID CULTURE: Culture: NO GROWTH

## 2019-12-20 LAB — TOTAL BILIRUBIN, BODY FLUID: Total bilirubin, fluid: 0.2 mg/dL

## 2019-12-24 ENCOUNTER — Emergency Department

## 2019-12-24 ENCOUNTER — Emergency Department
Admission: EM | Admit: 2019-12-24 | Discharge: 2019-12-25 | Disposition: A | Discharge status: Home / Self Care | Attending: Emergency Medicine | Admitting: Emergency Medicine

## 2019-12-24 ENCOUNTER — Other Ambulatory Visit: Payer: Self-pay

## 2019-12-24 ENCOUNTER — Encounter: Payer: Self-pay | Admitting: Emergency Medicine

## 2019-12-24 DIAGNOSIS — R140 Abdominal distension (gaseous): Secondary | ICD-10-CM | POA: Diagnosis present

## 2019-12-24 DIAGNOSIS — E1165 Type 2 diabetes mellitus with hyperglycemia: Secondary | ICD-10-CM | POA: Diagnosis not present

## 2019-12-24 DIAGNOSIS — I10 Essential (primary) hypertension: Secondary | ICD-10-CM | POA: Insufficient documentation

## 2019-12-24 DIAGNOSIS — C3411 Malignant neoplasm of upper lobe, right bronchus or lung: Secondary | ICD-10-CM | POA: Diagnosis not present

## 2019-12-24 DIAGNOSIS — K7031 Alcoholic cirrhosis of liver with ascites: Secondary | ICD-10-CM | POA: Diagnosis not present

## 2019-12-24 DIAGNOSIS — Z7984 Long term (current) use of oral hypoglycemic drugs: Secondary | ICD-10-CM | POA: Insufficient documentation

## 2019-12-24 DIAGNOSIS — F17210 Nicotine dependence, cigarettes, uncomplicated: Secondary | ICD-10-CM | POA: Insufficient documentation

## 2019-12-24 LAB — URINALYSIS, COMPLETE (UACMP) WITH MICROSCOPIC
Bilirubin Urine: NEGATIVE
Glucose, UA: NEGATIVE mg/dL
Hgb urine dipstick: NEGATIVE
Ketones, ur: NEGATIVE mg/dL
Leukocytes,Ua: NEGATIVE
Nitrite: NEGATIVE
Protein, ur: NEGATIVE mg/dL
Specific Gravity, Urine: 1.006 (ref 1.005–1.030)
pH: 5 (ref 5.0–8.0)

## 2019-12-24 MED ORDER — MORPHINE SULFATE (PF) 4 MG/ML IV SOLN
4.0000 mg | INTRAVENOUS | Status: DC | PRN
  Administered 2019-12-24 – 2019-12-25 (×5): 4 mg via INTRAVENOUS
  Filled 2019-12-24 (×5): qty 1

## 2019-12-24 NOTE — ED Notes (Signed)
Pt able to pivot with 1 person assistance. Pt pivoted to the wheelchair and in the bathroom at this time.

## 2019-12-24 NOTE — ED Provider Notes (Signed)
[facility] Emergency Department Provider Note   ____________________________________________   First MD Initiated Contact with Patient 12/24/19 1828     (approximate)  I have reviewed the triage vital signs and the nursing notes.   HISTORY  Chief Complaint Ascites    HPI Audrey Garrett is a 59 y.o. female with a history of alcoholic cirrhosis who presents for increasing abdominal distention in the setting of ascites.  Patient states that she had a paracentesis approximately 1 week ago and the fluid has already accumulated.  Patient states that she is on hospice at this time.  Patient also endorses generalized 10/10, nonradiating abdominal pain that has no exacerbating or relieving factors.  Patient states that she is taking home oxycodone to try to control this pain.         Past Medical History:  Diagnosis Date  . Alcohol abuse   . Alcoholic cirrhosis ([place])    GI-- dr skulskie/  hx ascites 2017   . Anxiety   . Chronic hepatitis C ([place])    treated w/ harvoni(2018)  . Chronic LBP 12/25/2014   Overview:  Post extensive surgery   . Chronic pain syndrome    neck , upper back, post extensive spinal surgery  . DDD (degenerative disc disease), lumbar   . Diverticulosis   . Emphysema lung ([place])   . Fibromyalgia   . Full dentures    partials, upper and lower  . GERD (gastroesophageal reflux disease)    gastroparesis  . Hiatal hernia   . History of GI bleed    06/ 2017  ulcers and gastric erosions, transfused x4/  10/ 2017 unknown source/  01/ 2018  transfused x2 units and IV Iron   . Hypertension   . Iron deficiency anemia due to chronic blood loss    hemotologist-- dr Alvy Bimler  . Lumbar facet joint syndrome   . Lumbar spondylosis   . Lung cancer ([place]) 11/25/2016   right upper lobe nodule , Stage I,  treatment SBRT  . MDD (major depressive disorder)   . Nephrolithiasis    per CT 09-30-2017 right side nonobstructive  . OA (osteoarthritis of  spine)   . Polyarthritis   . Polyneuropathy   . Restless leg syndrome   . Right renal mass   . RLS (restless legs syndrome)   . SOB (shortness of breath)   . Type 2 diabetes mellitus ([place])    followed by pcp  . Urinary frequency   . Vitamin D deficiency disease     Patient Active Problem List   Diagnosis Date Noted  . Hyperlipidemia associated with type 2 diabetes mellitus ([place]) 12/16/2019  . Alcohol use disorder, severe, dependence ([place]) 12/16/2019  . Ovarian cyst 08/08/2019  . Charcot's joint, right ankle and foot 01/26/2019  . Goals of care, counseling/discussion 12/11/2018  . Disorder of skeletal system 12/11/2018  . Problems influencing health status 12/11/2018  . DDD (degenerative disc disease), lumbosacral 12/11/2018  . Coccygodynia 12/11/2018  . Cancer-related pain 12/10/2018  . Closed fracture of proximal end of left humerus 11/03/2018  . Charcot's joint of left foot 06/27/2018  . Dislocation of tarsal joint of right foot 05/09/2018  . Gastroesophageal reflux disease without esophagitis 05/09/2018  . H/O malignant neoplasm of lung 05/09/2018  . H/O renal cell cancer 05/09/2018  . Hyponatremia 05/09/2018  . Type 2 diabetes mellitus ([place]) 05/09/2018  . Anemia, iron deficiency 05/09/2018  . Essential hypertension 05/09/2018  . Lung nodule 05/09/2018  . Chronic  left sacroiliac joint pain 04/20/2018  . Swelling of left lower extremity 04/20/2018  . Iron deficiency anemia 02/03/2018  . Renal lesion 12/14/2017  . Malignant neoplasm of upper lobe of right lung ([place]) 12/02/2016  . Chronic groin pain, left 08/19/2016  . Personal history of tobacco use, presenting hazards to health 08/04/2016  . Osteoarthritis of hip (Bilateral) 07/12/2016  . Chronic hip pain (Bilateral) 07/07/2016  . Chronic hepatitis C without hepatic coma ([place]) 05/13/2016  . Bilateral leg edema 04/07/2016  . Opiate use 02/17/2016  . RLS (restless legs syndrome) 02/18/2016  . Iron deficiency anemia  due to chronic blood loss 02/11/2016  . Controlled type 2 diabetes mellitus with hyperglycemia ([place]) 01/21/2016  . Acute blood loss anemia 01/21/2016  . Malnutrition of moderate degree 01/21/2016  . Chronic pain syndrome   . Hernia of anterior abdominal wall   . Incarcerated umbilical hernia   . Neurogenic pain 10/01/2015  . S/P thoracolumbar fusion (T5-L5) 10/01/2015  . Grade 1 Retrolisthesis (4 mm) of C5 over C6 and C6 over C7 10/01/2015  . Cervical foraminal stenosis (Left C5-6; Bilateral C6-7) 10/01/2015  . Acute GI bleeding 09/22/2015  . Depression, major, recurrent, moderate ([place]) 05/27/2015  . Abdominal pain, chronic, epigastric 05/27/2015  . Difficulty urinating 05/26/2015  . Nephrolithiasis 05/26/2015  . Alcoholic cirrhosis of liver with ascites ([place]) 05/26/2015  . Hypotension 05/22/2015  . Anemia 05/22/2015  . Thrombocytopenia ([place]) 05/22/2015  . Coagulopathy ([place]) 05/22/2015  . Hyperglycemia 05/22/2015  . Polyneuropathy 05/22/2015  . Urinary retention 05/22/2015  . Hepatic cirrhosis ([place]) 05/19/2015  . Cirrhosis of liver with ascites ([place]) 05/19/2015  . Hypomagnesemia (low magnesium levels) 04/07/2015  . At risk for osteopenia 04/03/2015  . Lumbar spondylosis 04/03/2015  . Chronic lumbar radicular pain (Left) 04/03/2015  . Chronic neck pain 04/03/2015  . Chronic lower extremity pain (2ry area of Pain) (Left) 04/03/2015  . Chronic sacroiliac joint pain (Bilateral) (R>L) 04/03/2015  . Chronic upper back pain 04/03/2015  . Long term current use of opiate analgesic 04/03/2015  . Encounter for chronic pain management 04/03/2015  . Cervical radicular pain 01/01/2015  . Uncomplicated opioid dependence ([place]) 01/01/2015  . Long term prescription opiate use 01/01/2015  . 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 Lumbar facet syndrome (Bilateral) (L>R) 01/01/2015  . Osteoarthritis of spine with radiculopathy, lumbar region 01/01/2015  . Chronic musculoskeletal pain  01/01/2015  . Chronic low back pain (1ry area of Pain) (Bilateral) (L>R) 01/01/2015  . Insomnia, persistent 12/25/2014  . Hypertension associated with diabetes ([place]) 12/25/2014  . HCV (hepatitis C virus) 12/25/2014  . Depression, major, in remission ([place]) 12/25/2014    Past Surgical History:  Procedure Laterality Date  . COLONOSCOPY WITH PROPOFOL N/A 09/25/2015   Procedure: COLONOSCOPY WITH PROPOFOL;  Surgeon: Lollie Sails, MD;  Location: [facility] ENDOSCOPY;  Service: Endoscopy;  Laterality: N/A;  . COLONOSCOPY WITH PROPOFOL N/A 04/01/2016   Procedure: COLONOSCOPY WITH PROPOFOL;  Surgeon: Lollie Sails, MD;  Location: [facility] ENDOSCOPY;  Service: Endoscopy;  Laterality: N/A;  . ELECTROMAGNETIC NAVIGATION BROCHOSCOPY N/A 11/25/2016   Procedure: ELECTROMAGNETIC NAVIGATION BRONCHOSCOPY;  Surgeon: Flora Lipps, MD;  Location: [facility] ORS;  Service: Cardiopulmonary;  Laterality: N/A;  . ENDOMETRIAL ABLATION  2007  . ESOPHAGOGASTRODUODENOSCOPY N/A 04/14/2015   Procedure: ESOPHAGOGASTRODUODENOSCOPY (EGD);  Surgeon: Lollie Sails, MD;  Location: [facility] ENDOSCOPY;  Service: Endoscopy;  Laterality: N/A;  . ESOPHAGOGASTRODUODENOSCOPY (EGD) WITH PROPOFOL N/A 09/16/2015   Procedure: ESOPHAGOGASTRODUODENOSCOPY (EGD) WITH PROPOFOL;  Surgeon: Lollie Sails, MD;  Location: [facility] ENDOSCOPY;  Service: Endoscopy;  Laterality: N/A;  . ESOPHAGOGASTRODUODENOSCOPY (EGD) WITH PROPOFOL N/A 01/22/2016   Procedure: ESOPHAGOGASTRODUODENOSCOPY (EGD) WITH PROPOFOL;  Surgeon: [name], MD;  Location: [place];  Service: Endoscopy;  Laterality: N/A;  . ESOPHAGOGASTRODUODENOSCOPY (EGD) WITH PROPOFOL N/A 04/01/2016   Procedure: ESOPHAGOGASTRODUODENOSCOPY (EGD) WITH PROPOFOL;  Surgeon: Lollie Sails, MD;  Location: [facility] ENDOSCOPY;  Service: Endoscopy;  Laterality: N/A;  . FOOT ARTHRODESIS Right 04/06/2018   Procedure: ARTHRODESIS - HALLUX/IPJOINT;  Surgeon: [name], DPM;  Location: [place];   Service: Podiatry;  Laterality: Right;  HAMMERLOCK IMPLANTS PARAGON 4-0 SCREW LMA LOCAL Diabetic - oral meds  . IR RADIOLOGIST EVAL & MGMT  04/13/2017  . IR RADIOLOGIST EVAL & MGMT  11/15/2017  . RADIOLOGY WITH ANESTHESIA Right 12/14/2017   Procedure: CT MICROWAVE THERMAL ABLATION AND BIOPSY WITH ANESTHESIA;  Surgeon: Markus Daft, MD;  Location: [facility] ORS;  Service: Anesthesiology;  Laterality: Right;  . SPINAL FUSION  12/19/2011   T5 to sacrum (rod, screws)  . TONSILLECTOMY  age 80  . TUBAL LIGATION Bilateral yrs ago  . UMBILICAL HERNIA REPAIR N/A 11/06/2015   Procedure: HERNIA REPAIR UMBILICAL ADULT;  Surgeon: [name], MD;  Location: [facility] ORS;  Service: General;  Laterality: N/A;    Prior to Admission medications   Medication Sig Start Date End Date Taking? Authorizing Provider  [facility] SENNA 8.6 MG tablet Take 1-2 tablets by mouth daily. 12/22/19  Yes [provider]  cyclobenzaprine (FLEXERIL) 5 MG tablet Take 1 tablet (5 mg total) by mouth 3 (three) times daily as needed for muscle spasms. 09/03/19 03/01/20 Yes [name], MD  escitalopram (LEXAPRO) 20 MG tablet Take 20 mg by mouth daily.   Yes [provider]  furosemide (LASIX) 40 MG tablet Take 40 mg by mouth 2 (two) times daily.  10/13/15  Yes [provider]  LINZESS 145 MCG CAPS capsule Take 145 mcg by mouth every morning. 05/07/19  Yes [provider]  metFORMIN (GLUCOPHAGE) 850 MG tablet Take 850 mg by mouth 2 (two) times daily. 11/01/18  Yes [provider]  Oxycodone HCl 20 MG TABS Take 1 tablet (20 mg total) by mouth every 6 (six) hours. Must last 30 days 12/08/19 01/07/20 Yes [name], MD  OZEMPIC 1 MG/DOSE SOPN Inject 1 mg into the skin every Tuesday.   Yes [provider]  pantoprazole (PROTONIX) 40 MG tablet Take 40 mg by mouth 2 (two) times daily.  07/29/16  Yes [provider]  pregabalin (LYRICA) 225 MG capsule Take 225 mg by mouth 2 (two) times daily.  12/08/19  Yes [provider]  rosuvastatin (CRESTOR) 5 MG tablet Take 5 mg by mouth at bedtime. 05/07/19  Yes [provider]  spironolactone (ALDACTONE) 50 MG tablet Take 50 mg by mouth daily.  09/08/16  Yes [provider]  TRESIBA FLEXTOUCH 200 UNIT/ML FlexTouch Pen SMARTSIG:10 Unit(s) SUB-Q Daily 05/07/19  Yes [provider]  Oxycodone HCl 20 MG TABS Take 1 tablet (20 mg total) by mouth every 6 (six) hours. Must last 30 days 01/07/20 02/06/20  [name], MD  Oxycodone HCl 20 MG TABS Take 1 tablet (20 mg total) by mouth every 6 (six) hours. Must last 30 days 02/06/20 03/07/20  [name], MD    Allergies Patient has no known allergies.  Family History  Problem Relation Age of Onset  . Stroke Mother   . Heart disease Mother   . [name] Father   .  Breast cancer Sister 66  . Kidney disease Neg Hx   . Bladder Cancer Neg Hx   . Kidney cancer Neg Hx   . Prostate cancer Neg Hx     Social History Social History   Tobacco Use  . Smoking status: Current Every Day Smoker    Packs/day: 0.50    Years: 45.00    Pack years: 22.50    Types: Cigarettes  . Smokeless tobacco: Never Used  . Tobacco comment: 12-05-2017 2ppd until 2017 down to 1/2 ppd  Vaping Use  . Vaping Use: Never used  Substance Use Topics  . Alcohol use: Yes    Alcohol/week: 56.0 standard drinks    Types: 56 Cans of beer per week  . Drug use: No    Review of Systems Constitutional: No fever/chills Eyes: No visual changes. ENT: No sore throat. Cardiovascular: Denies chest pain. Respiratory: Denies shortness of breath. Gastrointestinal: Endorses abdominal pain.  No nausea, no vomiting.  No diarrhea. Genitourinary: Negative for dysuria. Musculoskeletal: Negative for acute arthralgias Skin: Negative for rash. Neurological: Negative for headaches, weakness/numbness/paresthesias in any extremity Psychiatric: Negative for suicidal ideation/homicidal  ideation   ____________________________________________   PHYSICAL EXAM:  VITAL SIGNS: ED Triage Vitals  Enc Vitals Group     BP 12/24/19 1435 118/63     Pulse Rate 12/24/19 1435 94     Resp 12/24/19 1435 15     Temp 12/24/19 1435 98.7 F (37.1 C)     Temp Source 12/24/19 1435 Oral     SpO2 12/24/19 1435 99 %     Weight 12/24/19 1436 200 lb (90.7 kg)     Height 12/24/19 1436 5\' 9"  (1.753 m)     Head Circumference --      Peak Flow --      Pain Score 12/24/19 1435 10     Pain Loc --      Pain Edu? --      Excl. in [place]? --    Constitutional: Alert and oriented. Well appearing and in no acute distress. Eyes: Conjunctivae are normal. PERRL. Head: Atraumatic. Nose: No congestion/rhinnorhea. Mouth/Throat: Mucous membranes are moist. Neck: No stridor Cardiovascular: Grossly normal heart sounds.  Good peripheral circulation. Respiratory: Normal respiratory effort.  No retractions. Gastrointestinal: Distended and tense.  Mild generalized tenderness to palpation. Musculoskeletal: No obvious deformities Neurologic:  Normal speech and language. No gross focal neurologic deficits are appreciated. Skin:  Skin is warm and dry. No rash noted. Psychiatric: Mood and affect are normal. Speech and behavior are normal.  ____________________________________________   LABS (all labs ordered are listed, but only abnormal results are displayed)  Labs Reviewed  URINALYSIS, COMPLETE (UACMP) WITH MICROSCOPIC - Abnormal; Notable for the following components:      Result Value   Color, Urine YELLOW (*)    APPearance CLEAR (*)    Bacteria, UA MANY (*)    All other components within normal limits    RADIOLOGY  ED MD interpretation: 2 view x-ray of the chest shows no evidence of acute abnormalities including pneumothorax, pneumonia, widened mediastinum, or pleural effusions  Official radiology report(s): DG Chest 2 View  Result Date: 12/24/2019 CLINICAL DATA:  Ongoing ascites. EXAM: CHEST  - 2 VIEW COMPARISON:  December 14, 2017 FINDINGS: Mild, stable linear scarring and/or atelectasis is seen within the mid right lung. There is no evidence of acute infiltrate, pleural effusion or pneumothorax. The heart size and mediastinal contours are within normal limits. Bilateral pedicle screws are seen throughout the thoracic  spine. A chronic fracture deformity is seen involving the proximal left humerus. IMPRESSION: No active cardiopulmonary disease. Electronically Signed   By: Virgina Norfolk M.D.   On: 12/24/2019 15:41    ____________________________________________   PROCEDURES  Procedure(s) performed (including Critical Care):  Procedures   ____________________________________________   INITIAL IMPRESSION / ASSESSMENT AND PLAN / ED COURSE  As part of my medical decision making, I reviewed the following data within the [place] notes reviewed and incorporated, Labs reviewed, EKG interpreted, Old chart reviewed, Radiograph reviewed and Notes from prior ED visits reviewed and incorporated        ED Workup: CBC, BMP, LFTs, PT/INR, Ascitic Fluid studies (Cell count with differential, Gram stain, Glucose, Protein)  Given patient's history, exam there presentation is most consistent with abdominal pain from ascites volume burden. I have a low suspicion for Spontaneous Bacterial Peritonitis or other abdominal emergency at this time. The patient had a significant amount of ascites in the abdominal cavity necessitating paracentesis.  ED Interventions: None.  Spoke to on-call interventional radiologist who agrees to take patient for paracentesis in the morning.  Disposition: Discharge. Instructed regarding follow up within 24-48 hours with a primary care practitioner and strict return precautions.      ____________________________________________   FINAL CLINICAL IMPRESSION(S) / ED DIAGNOSES  Final diagnoses:  Ascites due to alcoholic cirrhosis  [facility])     ED Discharge Orders    None       Note:  This document was prepared using Dragon voice recognition software and may include unintentional dictation errors.   Naaman Plummer, MD 12/24/19 [phone]

## 2019-12-24 NOTE — ED Triage Notes (Signed)
Pt in via [facility] from home, complaints of ongoing ascites, having paracentesis last Monday, feeling as if she needs another one.    Pt is on hospice due to lung cancer and cirrhosis.  Pt does admit to continued alcohol use.    Vitals WDL, NAD noted at this time.

## 2019-12-24 NOTE — ED Notes (Signed)
Pt presentation discussed with EDP, [place]; no additional orders at this time.

## 2019-12-24 NOTE — ED Notes (Signed)
Dr. Cheri Fowler at bedside for reevaluation.

## 2019-12-24 NOTE — ED Triage Notes (Signed)
Pt in via EMS from home with c/o abd and back pain. Pt was seen here a week ago and had fluid removed from her abd. Pt has since accumulated that fluid again, 102 HR, #20 g to right AC. EMS administered 50 mcg of fentanyl. Pt admits to drinking 6-8 beers last pm

## 2019-12-24 NOTE — ED Notes (Signed)
Pt giving sandwich tray at this time. Spoke with pt and pt's son and stated she will be NPO after midnight pending her US paracentesis. Pt and son verbalized understanding

## 2019-12-25 ENCOUNTER — Emergency Department

## 2019-12-25 MED ORDER — OXYCODONE-ACETAMINOPHEN 5-325 MG PO TABS
1.0000 | ORAL_TABLET | Freq: Once | ORAL | Status: AC
  Administered 2019-12-25: 1 via ORAL
  Filled 2019-12-25: qty 1

## 2019-12-25 NOTE — ED Notes (Addendum)
Patient discharged home with [facility], patient received and EMS received discharge papers. Patient appropriate and cooperative. Vital signs taken. NAD noted.

## 2019-12-25 NOTE — ED Notes (Signed)
Pt placed on purwick at this time by this RN. Pt comfortable in bed, no further needs noted. Warm blankets given. Tom, Rn notified of placement in room.

## 2019-12-25 NOTE — Progress Notes (Signed)
[facility] ED 31 [facility]) Hospital Liaison RN note:  Patient is under hospice services with Manufacturing engineer, recently being treated for alcoholic cirrhosis. She is a DNR.  Visited with patient at bedside. She appears to be comfortable but states she does have back and abdominal pain. Her abdomen is distended and she has some lower extremity edema. Spoke with bedside RN and she reports plan is for paracentesis this am and possible pleurx catheter placed before discharge home by EMS today. [facility] will continue to follow and notify hospice team of discharge.  Please call with any hospice related questions or concerns.  Thank you.  Zandra Abts, RN [facility] Liaison  [phone]

## 2019-12-25 NOTE — ED Notes (Signed)
[facility]  CALLED  FOR  TRANSPORT  HOME 

## 2019-12-25 NOTE — ED Provider Notes (Signed)
-----------------------------------------   1:14 PM on 12/25/2019 -----------------------------------------  Patient has returned from paracentesis with improvement in symptoms, is breathing comfortably with no pain at this time.  She is appropriate for discharge home with follow-up with her hospice team as well as her GI provider.  I encouraged her to speak with her GI provider about regularly scheduled paracentesis or placement of catheter to allow for periodic drainage of ascites.  Patient and husband agree with plan.   Blake Divine, MD 12/25/19 1315

## 2019-12-25 NOTE — ED Notes (Signed)
Patient gone to ultrasound

## 2019-12-25 NOTE — Procedures (Signed)
Ultrasound-guided therapeutic paracentesis performed yielding 5.7 liters of yellow fluid. No immediate complications.EBL none.

## 2019-12-25 NOTE — ED Notes (Signed)
Visualized patient waving through door. Pt complaining of severe low back pain and asking for pain medication. Will give patient PRN 4mg  morphine.

## 2019-12-25 NOTE — ED Notes (Signed)
Writer went to ultrasound lab and administered patient her PO percocet for her paracentesis

## 2020-01-04 ENCOUNTER — Other Ambulatory Visit: Payer: Self-pay | Admitting: Gastroenterology

## 2020-01-04 DIAGNOSIS — K7031 Alcoholic cirrhosis of liver with ascites: Secondary | ICD-10-CM

## 2020-01-07 ENCOUNTER — Other Ambulatory Visit: Payer: Self-pay

## 2020-01-07 ENCOUNTER — Ambulatory Visit
Admission: RE | Admit: 2020-01-07 | Discharge: 2020-01-07 | Disposition: A | Discharge status: Home / Self Care | Source: Ambulatory Visit | Attending: Gastroenterology | Admitting: Gastroenterology

## 2020-01-07 DIAGNOSIS — K7031 Alcoholic cirrhosis of liver with ascites: Secondary | ICD-10-CM | POA: Insufficient documentation

## 2020-01-07 LAB — PROTEIN, PLEURAL OR PERITONEAL FLUID: Total protein, fluid: <3 g/dL

## 2020-01-07 LAB — BODY FLUID CELL COUNT WITH DIFFERENTIAL
Eos, Fluid: 0 %
Lymphs, Fluid: 7 %
Monocyte-Macrophage-Serous Fluid: 21 %
Neutrophil Count, Fluid: 72 %
Total Nucleated Cell Count, Fluid: 379 cu mm

## 2020-01-07 LAB — ALBUMIN, PLEURAL OR PERITONEAL FLUID: Albumin, Fluid: <1 g/dL

## 2020-01-07 NOTE — Procedures (Signed)
Interventional Radiology Procedure Note  Procedure: US guided paracentesis  Access: RLQ  Complications: None  Estimated Blood Loss: None  Findings: 8.7 L of translucent, straw-colored ascites removed. The patient refused albumin repletion.   Ruthann Cancer, MD Pager: [phone]

## 2020-01-08 LAB — CYTOLOGY - NON PAP

## 2020-01-09 LAB — PROTEIN, BODY FLUID (OTHER): Total Protein, Body Fluid Other: 0.8 g/dL

## 2020-01-12 LAB — AEROBIC/ANAEROBIC CULTURE W GRAM STAIN (SURGICAL/DEEP WOUND): Culture: NO GROWTH

## 2020-01-15 ENCOUNTER — Telehealth: Payer: Self-pay | Admitting: *Deleted

## 2020-01-15 NOTE — Telephone Encounter (Signed)
If she is on hospice, I dont think we should do CT and appointments. We should cancel everything. If patient wants to keep these appointments- I would suggest video visit first. I will talk about goals of care and then decide about CT

## 2020-01-15 NOTE — Telephone Encounter (Addendum)
Patient called wanting to update Dr Janese Banks on her medical condition. She reports that she is now on Hospice (ordered by Dr Elijio Miles) for the past month and that she has a lab/ CT appointment on 10/28 and see doctor 10/29 that she wants to keep. I spoke with hospice triage nurse who confirms that she has been on hospice services since [birth year]26[birth year] for alcoholic cirrhosis and that they will approve for her to have the CT and labs and see day and that they were not aware of her cancer. Do you still want to proceed with these appointments that patient would like to keep?

## 2020-01-15 NOTE — Telephone Encounter (Signed)
Patient informed f doctor response and she will await scheduelr to call her about her video visit appointment

## 2020-01-16 ENCOUNTER — Other Ambulatory Visit: Payer: Self-pay | Admitting: Gastroenterology

## 2020-01-17 ENCOUNTER — Other Ambulatory Visit: Payer: Self-pay | Admitting: Gastroenterology

## 2020-01-17 DIAGNOSIS — K7210 Chronic hepatic failure without coma: Secondary | ICD-10-CM

## 2020-01-24 ENCOUNTER — Other Ambulatory Visit: Payer: Medicare Other

## 2020-01-24 ENCOUNTER — Ambulatory Visit: Admission: RE | Admit: 2020-01-24 | Source: Ambulatory Visit

## 2020-01-25 ENCOUNTER — Other Ambulatory Visit: Payer: Self-pay

## 2020-01-25 ENCOUNTER — Ambulatory Visit: Admission: RE | Admit: 2020-01-25 | Source: Ambulatory Visit

## 2020-01-25 ENCOUNTER — Other Ambulatory Visit: Admission: RE | Admit: 2020-01-25 | Source: Ambulatory Visit

## 2020-01-25 ENCOUNTER — Other Ambulatory Visit: Payer: Medicare Other

## 2020-01-25 ENCOUNTER — Inpatient Hospital Stay: Payer: Medicare Other | Attending: Oncology | Admitting: Oncology

## 2020-01-28 ENCOUNTER — Other Ambulatory Visit: Payer: Self-pay | Admitting: Student

## 2020-01-29 ENCOUNTER — Other Ambulatory Visit: Payer: Self-pay

## 2020-01-29 ENCOUNTER — Ambulatory Visit
Admission: RE | Admit: 2020-01-29 | Discharge: 2020-01-29 | Disposition: A | Discharge status: Home / Self Care | Source: Ambulatory Visit | Attending: Gastroenterology | Admitting: Gastroenterology

## 2020-01-29 ENCOUNTER — Telehealth: Payer: Self-pay | Admitting: *Deleted

## 2020-01-29 DIAGNOSIS — K7031 Alcoholic cirrhosis of liver with ascites: Secondary | ICD-10-CM | POA: Insufficient documentation

## 2020-01-29 DIAGNOSIS — F17210 Nicotine dependence, cigarettes, uncomplicated: Secondary | ICD-10-CM | POA: Diagnosis not present

## 2020-01-29 DIAGNOSIS — K7210 Chronic hepatic failure without coma: Secondary | ICD-10-CM

## 2020-01-29 LAB — GLUCOSE, CAPILLARY: Glucose-Capillary: 192 mg/dL — ABNORMAL HIGH (ref 70–99)

## 2020-01-29 MED ORDER — CEFAZOLIN SODIUM-DEXTROSE 2-4 GM/100ML-% IV SOLN
INTRAVENOUS | Status: AC
  Administered 2020-01-29: 11:00:00 2 g via INTRAVENOUS
  Filled 2020-01-29: qty 100

## 2020-01-29 MED ORDER — MIDAZOLAM HCL 2 MG/2ML IJ SOLN
INTRAMUSCULAR | Status: AC
  Filled 2020-01-29: qty 2

## 2020-01-29 MED ORDER — SODIUM CHLORIDE 0.9 % IV SOLN: INTRAVENOUS | Status: DC

## 2020-01-29 MED ORDER — CEFAZOLIN SODIUM-DEXTROSE 2-4 GM/100ML-% IV SOLN: 2.0000 g | INTRAVENOUS | Status: AC

## 2020-01-29 MED ORDER — MIDAZOLAM HCL 2 MG/2ML IJ SOLN
INTRAMUSCULAR | Status: AC | PRN
  Administered 2020-01-29: 1 mg via INTRAVENOUS

## 2020-01-29 MED ORDER — CEFAZOLIN SODIUM-DEXTROSE 2-4 GM/100ML-% IV SOLN
INTRAVENOUS | Status: AC
  Filled 2020-01-29: qty 100

## 2020-01-29 NOTE — H&P (Signed)
Chief Complaint: Patient was seen in consultation today for peritoneal PleurX catheter placement at the request of [name],Christiane Garrett  Referring Physician(s): [name],Christiane Henderson [place]  Supervising Physician: Markus Daft  Patient Status: [facility] - Out-pt  History of Present Illness: Audrey Garrett is a 59 y.o. female with hx of hep C and alcoholic cirrhosis with recurrent ascites. She has had multiple large volume paracentesis over the past few weeks. Unfortunately, she has progressed to end stage liver disease and they have made the decision to move on to Hospice and IR is now requested to place tunneled peritoneal pleurx catheter. PMHx, meds, labs, imaging, allergies reviewed. Feels well, no recent fevers, chills, illness. Has been NPO today as directed. Son at bedside   Past Medical History:  Diagnosis Date  . Alcohol abuse   . Alcoholic cirrhosis (Audrey Garrett)    GI-- dr skulskie/  hx ascites 2017   . Anxiety   . Chronic hepatitis C (Audrey Garrett)    treated w/ harvoni(2018)  . Chronic LBP 12/25/2014   Overview:  Post extensive surgery   . Chronic pain syndrome    neck , upper back, post extensive spinal surgery  . DDD (degenerative disc disease), lumbar   . Diverticulosis   . Emphysema lung (Audrey Garrett)   . Fibromyalgia   . Full dentures    partials, upper and lower  . GERD (gastroesophageal reflux disease)    gastroparesis  . Hiatal hernia   . History of GI bleed    06/ 2017  ulcers and gastric erosions, transfused x4/  10/ 2017 unknown source/  01/ 2018  transfused x2 units and IV Iron   . Hypertension   . Iron deficiency anemia due to chronic blood loss    hemotologist-- dr Alvy Bimler  . Lumbar facet joint syndrome   . Lumbar spondylosis   . Lung cancer (Audrey Garrett) 11/25/2016   right upper lobe nodule , Stage I,  treatment SBRT  . MDD (major depressive disorder)   . Nephrolithiasis    per CT 09-30-2017 right side nonobstructive  . OA (osteoarthritis of spine)   . Polyarthritis     . Polyneuropathy   . Restless leg syndrome   . Right renal mass   . RLS (restless legs syndrome)   . SOB (shortness of breath)   . Type 2 diabetes mellitus (Audrey Garrett)    followed by pcp  . Urinary frequency   . Vitamin D deficiency disease     Past Surgical History:  Procedure Laterality Date  . COLONOSCOPY WITH PROPOFOL N/A 09/25/2015   Procedure: COLONOSCOPY WITH PROPOFOL;  Surgeon: Lollie Sails, MD;  Location: [facility] ENDOSCOPY;  Service: Endoscopy;  Laterality: N/A;  . COLONOSCOPY WITH PROPOFOL N/A 04/01/2016   Procedure: COLONOSCOPY WITH PROPOFOL;  Surgeon: Lollie Sails, MD;  Location: [facility] ENDOSCOPY;  Service: Endoscopy;  Laterality: N/A;  . ELECTROMAGNETIC NAVIGATION BROCHOSCOPY N/A 11/25/2016   Procedure: ELECTROMAGNETIC NAVIGATION BRONCHOSCOPY;  Surgeon: Flora Lipps, MD;  Location: [facility] ORS;  Service: Cardiopulmonary;  Laterality: N/A;  . ENDOMETRIAL ABLATION  2007  . ESOPHAGOGASTRODUODENOSCOPY N/A 04/14/2015   Procedure: ESOPHAGOGASTRODUODENOSCOPY (EGD);  Surgeon: Lollie Sails, MD;  Location: [facility] ENDOSCOPY;  Service: Endoscopy;  Laterality: N/A;  . ESOPHAGOGASTRODUODENOSCOPY (EGD) WITH PROPOFOL N/A 09/16/2015   Procedure: ESOPHAGOGASTRODUODENOSCOPY (EGD) WITH PROPOFOL;  Surgeon: Lollie Sails, MD;  Location: [facility] ENDOSCOPY;  Service: Endoscopy;  Laterality: N/A;  . ESOPHAGOGASTRODUODENOSCOPY (EGD) WITH PROPOFOL N/A 01/22/2016   Procedure: ESOPHAGOGASTRODUODENOSCOPY (EGD) WITH PROPOFOL;  Surgeon: [name], MD;  Location: [facility] ENDOSCOPY;  Service: Endoscopy;  Laterality: N/A;  . ESOPHAGOGASTRODUODENOSCOPY (EGD) WITH PROPOFOL N/A 04/01/2016   Procedure: ESOPHAGOGASTRODUODENOSCOPY (EGD) WITH PROPOFOL;  Surgeon: Lollie Sails, MD;  Location: [facility] ENDOSCOPY;  Service: Endoscopy;  Laterality: N/A;  . FOOT ARTHRODESIS Right 04/06/2018   Procedure: ARTHRODESIS - HALLUX/IPJOINT;  Surgeon: [name], DPM;  Location: [facility];  Service: Podiatry;   Laterality: Right;  HAMMERLOCK IMPLANTS PARAGON 4-0 SCREW LMA LOCAL Diabetic - oral meds  . IR RADIOLOGIST EVAL & MGMT  04/13/2017  . IR RADIOLOGIST EVAL & MGMT  11/15/2017  . RADIOLOGY WITH ANESTHESIA Right 12/14/2017   Procedure: CT MICROWAVE THERMAL ABLATION AND BIOPSY WITH ANESTHESIA;  Surgeon: Markus Daft, MD;  Location: [facility] ORS;  Service: Anesthesiology;  Laterality: Right;  . SPINAL FUSION  12/19/2011   T5 to sacrum (rod, screws)  . TONSILLECTOMY  age 64  . TUBAL LIGATION Bilateral yrs ago  . UMBILICAL HERNIA REPAIR N/A 11/06/2015   Procedure: HERNIA REPAIR UMBILICAL ADULT;  Surgeon: Audrey Glen, MD;  Location: [facility] ORS;  Service: General;  Laterality: N/A;    Allergies: Patient has no known allergies.  Medications: Prior to Admission medications   Medication Sig Start Date End Date Taking? Authorizing Provider  [employer] SENNA 8.6 MG tablet Take 1-2 tablets by mouth daily. 12/22/19   [provider]  cyclobenzaprine (FLEXERIL) 5 MG tablet Take 1 tablet (5 mg total) by mouth 3 (three) times daily as needed for muscle spasms. 09/03/19 03/01/20  [name], MD  escitalopram (LEXAPRO) 20 MG tablet Take 20 mg by mouth daily.    [provider]  furosemide (LASIX) 40 MG tablet Take 40 mg by mouth 2 (two) times daily.  10/13/15   [provider]  LINZESS 145 MCG CAPS capsule Take 145 mcg by mouth every morning. 05/07/19   [provider]  metFORMIN (GLUCOPHAGE) 850 MG tablet Take 850 mg by mouth 2 (two) times daily. 11/01/18   [provider]  Oxycodone HCl 20 MG TABS Take 1 tablet (20 mg total) by mouth every 6 (six) hours. Must last 30 days 12/08/19 01/07/20  [name], MD  Oxycodone HCl 20 MG TABS Take 1 tablet (20 mg total) by mouth every 6 (six) hours. Must last 30 days 01/07/20 02/06/20  [name], MD  Oxycodone HCl 20 MG TABS Take 1 tablet (20 mg total) by mouth every 6 (six) hours. Must last 30 days 02/06/20 03/07/20   [name], MD  OZEMPIC 1 MG/DOSE SOPN Inject 1 mg into the skin every Tuesday.    [provider]  pantoprazole (PROTONIX) 40 MG tablet Take 40 mg by mouth 2 (two) times daily.  07/29/16   [provider]  pregabalin (LYRICA) 225 MG capsule Take 225 mg by mouth 2 (two) times daily. 12/08/19   [provider]  rosuvastatin (CRESTOR) 5 MG tablet Take 5 mg by mouth at bedtime. 05/07/19   [provider]  spironolactone (ALDACTONE) 50 MG tablet Take 50 mg by mouth daily.  09/08/16   [provider]  TRESIBA FLEXTOUCH 200 UNIT/ML FlexTouch Pen SMARTSIG:10 Unit(s) SUB-Q Daily 05/07/19   [provider]     Family History  Problem Relation Age of Onset  . Stroke Mother   . Heart disease Mother   . [name] Father   . Breast cancer Sister 48  . Kidney disease Neg Hx   . Bladder Cancer Neg Hx   . Kidney cancer Neg Hx   .  Prostate cancer Neg Hx     Social History   Socioeconomic History  . Marital status: Married    Spouse name: Not on file  . Number of children: Not on file  . Years of education: Not on file  . Highest education level: Not on file  Occupational History  . Not on file  Tobacco Use  . Smoking status: Current Every Day Smoker    Packs/day: 0.50    Years: 45.00    Pack years: 22.50    Types: Cigarettes  . Smokeless tobacco: Never Used  . Tobacco comment: 12-05-2017 2ppd until 2017 down to 1/2 ppd  Vaping Use  . Vaping Use: Never used  Substance and Sexual Activity  . Alcohol use: Yes    Alcohol/week: 56.0 standard drinks    Types: 56 Cans of beer per week  . Drug use: No  . Sexual activity: Not on file  Other Topics Concern  . Not on file  Social History Narrative   Lives at home with husband   Social Determinants of Health   Financial Resource Strain:   . Difficulty of Paying Living Expenses: Not on file  Food Insecurity:   . Worried About Charity fundraiser in the Last Year: Not on file  . Ran Out  of Food in the Last Year: Not on file  Transportation Needs:   . Lack of Transportation (Medical): Not on file  . Lack of Transportation (Non-Medical): Not on file  Physical Activity:   . Days of Exercise per Week: Not on file  . Minutes of Exercise per Session: Not on file  Stress:   . Feeling of Stress : Not on file  Social Connections:   . Frequency of Communication with Friends and Family: Not on file  . Frequency of Social Gatherings with Friends and Family: Not on file  . Attends Religious Services: Not on file  . Active Member of Clubs or Organizations: Not on file  . Attends Archivist Meetings: Not on file  . Marital Status: Not on file    Review of Systems: A 12 point ROS discussed and pertinent positives are indicated in the HPI above.  All other systems are negative.  Review of Systems  Vital Signs: BP (!) 101/58   Pulse (!) 102   Temp 98.7 F (37.1 C) (Oral)   Resp 15   Ht 5\' 10"  (1.778 m)   Wt 81.6 kg   SpO2 92%   BMI 25.83 kg/m   Physical Exam Constitutional:      Appearance: Normal appearance.  HENT:     Mouth/Throat:     Mouth: Mucous membranes are moist.     Pharynx: Oropharynx is clear.  Cardiovascular:     Rate and Rhythm: Normal rate and regular rhythm.     Heart sounds: Normal heart sounds.  Pulmonary:     Effort: Pulmonary effort is normal. No respiratory distress.     Breath sounds: Normal breath sounds.  Abdominal:     General: There is distension.     Palpations: Abdomen is soft.     Tenderness: There is no abdominal tenderness.  Skin:    General: Skin is warm and dry.  Neurological:     General: No focal deficit present.     Mental Status: She is alert and oriented to person, place, and time.  Psychiatric:        Mood and Affect: Mood normal.        Thought  Content: Thought content normal.        Judgment: Judgment normal.       Imaging: US Paracentesis  Result Date: 01/07/2020 INDICATION: 59 year old female  with history of alcoholic cirrhosis and recurrent ascites. EXAM: ULTRASOUND GUIDED right lower quadrant PARACENTESIS MEDICATIONS: None. COMPLICATIONS: None immediate. PROCEDURE: Informed written consent was obtained from the patient after a discussion of the risks, benefits and alternatives to treatment. A timeout was performed prior to the initiation of the procedure. Initial ultrasound scanning demonstrates a large amount of ascites within the right lower abdominal quadrant. The right lower abdomen was prepped and draped in the usual sterile fashion. 1% lidocaine was used for local anesthesia. Following this, a 6 Fr Safe-T-Centesis catheter was introduced. An ultrasound image was saved for documentation purposes. The paracentesis was performed. The catheter was removed and a dressing was applied. The patient tolerated the procedure well without immediate post procedural complication. Patient received post-procedure intravenous albumin; see nursing notes for details. FINDINGS: A total of approximately 8.7 of translucent, straw-colored fluid was removed. Samples were sent to the laboratory as requested by the clinical team. IMPRESSION: Successful ultrasound-guided paracentesis yielding 8.7 liters of peritoneal fluid. Ruthann Cancer, MD [facility] Radiology Electronically Signed   By: Ruthann Cancer MD   On: 01/07/2020 09:34    Labs:  CBC: Recent Labs    07/04/19 3536 07/20/19 0837 12/16/19 1659 12/17/19 0632  WBC 6.9 6.2 9.4 6.3  HGB 11.9* 11.6* 9.0* 8.0*  HCT 38.0 36.5 30.7* 25.6*  PLT 131* 136* 130* 120*    COAGS: Recent Labs    07/04/19 0928 12/16/19 1659 12/17/19 0632  INR 1.1 1.4* 1.4*    BMP: Recent Labs    07/04/19 0928 12/16/19 1659 12/17/19 0632  NA 132* 129* 130*  K 3.7 5.1 4.6  CL 93* 97* 98  CO2 27 21* 26  GLUCOSE 166* 183* 175*  BUN 8 <5* 5*  CALCIUM 8.8* 7.9* 7.8*  CREATININE 0.56 0.48 0.46  GFRNONAA >60 >60 >60    GFRAA >60 >60 >60    LIVER FUNCTION TESTS: Recent Labs    07/04/19 0928 12/16/19 1659 12/17/19 0632  BILITOT 0.7 1.1 1.3*  AST 39 74* 63*  ALT 20 26 23   ALKPHOS 93 110 95  PROT 8.1 6.7 6.1*  ALBUMIN 4.1 2.7* 2.5*    TUMOR MARKERS: No results for input(s): AFPTM, CEA, CA199, CHROMGRNA in the last 8760 hours.  Assessment and Plan: Recurrent ascites from end stage liver disease in setting of hepatitis C and cirrhosis. Plan for placement of tunneled peritoneal pleurX catheter Risks and benefits discussed with the patient including bleeding, infection, damage to adjacent structures, bowel perforation/fistula connection, and sepsis.  All of the patient's questions were answered, patient is agreeable to proceed. Consent signed and in chart.    Thank you for this interesting consult.  I greatly enjoyed meeting MAYZEE REICHENBACH and look forward to participating in their care.  A copy of this report was sent to the requesting provider on this date.  Electronically Signed: Ascencion Dike, PA-C 01/29/2020, 10:20 AM   I spent a total of 20 minutes in face to face in clinical consultation, greater than 50% of which was counseling/coordinating care for peritoneal pleurx

## 2020-01-29 NOTE — Progress Notes (Signed)
Patient resting, appears comfortable. Son at bedside:  Skyler.  Waiting on transport.  Vitals stable. Right lower abd area with dressing, tunneled peritoneal catheter intact.

## 2020-01-29 NOTE — Telephone Encounter (Signed)
Called pt to see if she wanted the video visit with Dr. Janese Banks. It was originally Friday 10/29 but w ecould not get in touch with her and Dr. Janese Banks tried getting on video and no response. I have asked her to call me and let me know if she still wants the visit. I know she is in hospice and had the peritoneal pleurx catheter placed the day before the 10/29 visit I left my direct number for her to call back and we can reschedule the video visit

## 2020-01-29 NOTE — Procedures (Signed)
Interventional Radiology Procedure:   Indications: Alcoholic cirrhosis and Hep. C with recurrent ascites and hospice care  Procedure: Placement of tunneled peritoneal cathter  Findings: Large volume of ascites.  Pleurx placed in right abdomen.   Complications: None     EBL: less than 10 ml  Plan: Discharge to home after drainage.  Management of drain per Hospice    Kaylanni Ezelle R. Anselm Pancoast, MD  Pager: [phone]

## 2020-01-30 ENCOUNTER — Telehealth: Payer: Self-pay | Admitting: *Deleted

## 2020-01-30 NOTE — Telephone Encounter (Signed)
Called patient today to check with her to see if she still wants a video visit with Dr. Janese Banks.  We were unable to reach her last week on her video appointment date.  The patient is agreeable to November 9 at 2 PM.  I have verified that the cell phone she wants to use is [phone].  I advised her that we will call her to make sure all the electronic information in the chart is accurate before we do the video visit.  Patient agreeable to the plan

## 2020-02-05 ENCOUNTER — Inpatient Hospital Stay: Payer: Medicare Other | Attending: Oncology | Admitting: Oncology

## 2020-02-05 ENCOUNTER — Other Ambulatory Visit: Payer: Self-pay

## 2020-02-18 ENCOUNTER — Encounter: Payer: Medicare Other | Admitting: Pain Medicine

## 2020-02-18 DIAGNOSIS — F1120 Opioid dependence, uncomplicated: Secondary | ICD-10-CM

## 2020-02-18 DIAGNOSIS — M961 Postlaminectomy syndrome, not elsewhere classified: Secondary | ICD-10-CM

## 2020-02-18 DIAGNOSIS — G894 Chronic pain syndrome: Secondary | ICD-10-CM

## 2020-02-18 DIAGNOSIS — G893 Neoplasm related pain (acute) (chronic): Secondary | ICD-10-CM

## 2020-02-18 DIAGNOSIS — Z79899 Other long term (current) drug therapy: Secondary | ICD-10-CM

## 2020-02-18 DIAGNOSIS — G8929 Other chronic pain: Secondary | ICD-10-CM

## 2020-02-27 NOTE — Progress Notes (Deleted)
PROVIDER NOTE: Information contained herein reflects review and annotations entered in association with encounter. Interpretation of such information and data should be left to medically-trained personnel. Information provided to patient can be located elsewhere in the medical record under "Patient Instructions". Document created using STT-dictation technology, any transcriptional errors that may result from process are unintentional.    Patient: Audrey Garrett  Service Category: E/M  Provider: Gaspar Cola, MD  DOB: 1960-12-11  DOS: 03/18/20  Specialty: Interventional Pain Management  MRN: [id number]  Setting: Ambulatory outpatient  PCP: Jodi Marble, MD  Type: Established Patient    Referring Provider: Jodi Marble, MD  Location: Office  Delivery: Face-to-face     HPI  [name], a 59 y.o. year old female, is here today because of her Chronic pain syndrome [G89.4]. Ms. Shrieves primary complain today is No chief complaint on file. Last encounter: My last encounter with her was on 12/05/2019. Pertinent problems: Ms. Strohman has Cervical radicular pain; Failed back surgical syndrome; Lumbar facet syndrome (Bilateral) (L>R); Osteoarthritis of spine with radiculopathy, lumbar region; Chronic musculoskeletal pain; Chronic low back pain (1ry area of Pain) (Bilateral) (L>R); Lumbar spondylosis; Chronic lumbar radicular pain (Left); Chronic neck pain; Chronic lower extremity pain (2ry area of Pain) (Left); Chronic sacroiliac joint pain (Bilateral) (R>L); Chronic upper back pain; Polyneuropathy; Abdominal pain, chronic, epigastric; Neurogenic pain; S/P thoracolumbar fusion (T5-L5); Grade 1 Retrolisthesis (4 mm) of C5 over C6 and C6 over C7; Cervical foraminal stenosis (Left C5-6; Bilateral C6-7); Chronic pain syndrome; Bilateral leg edema; Chronic hip pain (Bilateral); Osteoarthritis of hip (Bilateral); Chronic groin pain, left; Chronic left sacroiliac joint pain; Swelling of left lower  extremity; Dislocation of tarsal joint of right foot; H/O malignant neoplasm of lung; H/O renal cell cancer; Charcot's joint of left foot; Closed fracture of proximal end of left humerus; Cancer-related pain; DDD (degenerative disc disease), lumbosacral; Coccygodynia; and Charcot's joint, right ankle and foot on their pertinent problem list. Pain Assessment: Severity of   is reported as a  /10. Location:    / . Onset:  . Quality:  . Timing:  . Modifying factor(s):  Marland Kitchen Vitals:  vitals were not taken for this visit.   Reason for encounter: medication management. ***  RTCB: 06/05/2020 Nonopioids transfer March 18, 2020: Flexeril  Pharmacotherapy Assessment   Analgesic: Oxycodone IR 20 mg, 1 tab PO q 6 hrs(80 mg/day of oxycodone) MME/day:120 mg/day.   Monitoring: [place] PMP: PDMP reviewed during this encounter.       Pharmacotherapy: No side-effects or adverse reactions reported. Compliance: No problems identified. Effectiveness: Clinically acceptable.  No notes on file  UDS:  Summary  Date Value Ref Range Status  09/04/2019 Note  Final    Comment:    ==================================================================== [employer] [facility] (MW) ==================================================================== Test                             Result       Flag       Units  Drug Present and Declared for Prescription Verification   Oxycodone                      5945         EXPECTED   ng/mg creat   Oxymorphone                    4411         EXPECTED  ng/mg creat   Noroxycodone                   14418        EXPECTED   ng/mg creat   Noroxymorphone                 2307         EXPECTED   ng/mg creat    Sources of oxycodone are scheduled prescription medications.    Oxymorphone, noroxycodone, and noroxymorphone are expected    metabolites of oxycodone. Oxymorphone is also available as a    scheduled prescription  medication.  ==================================================================== Test                      Result    Flag   Units      Ref Range   Creatinine              55               mg/dL      >=20 ==================================================================== Declared Medications:  The flagging and interpretation on this report are based on the  following declared medications.  Unexpected results may arise from  inaccuracies in the declared medications.   **Note: The testing scope of this panel includes these medications:   Oxycodone   **Note: The testing scope of this panel does not include the  following reported medications:   Cyclobenzaprine (Flexeril)  Escitalopram (Lexapro)  Furosemide (Lasix)  Insulin Tyler Aas)  Linaclotide (Linzess)  Metformin (Glucophage)  Multivitamin  Pantoprazole (Protonix)  Pregabalin (Lyrica)  Rosuvastatin (Crestor)  Semaglutide (Ozempic)  Spironolactone (Aldactone) ==================================================================== For clinical consultation, please call [phone]. ====================================================================      ROS  Constitutional: Denies any fever or chills Gastrointestinal: No reported hemesis, hematochezia, vomiting, or acute GI distress Musculoskeletal: Denies any acute onset joint swelling, redness, loss of ROM, or weakness Neurological: No reported episodes of acute onset apraxia, aphasia, dysarthria, agnosia, amnesia, paralysis, loss of coordination, or loss of consciousness  Medication Review  HYDROmorphone, Oxycodone HCl, Semaglutide (1 MG/DOSE), cyclobenzaprine, escitalopram, furosemide, insulin degludec, linaclotide, metFORMIN, morphine, pantoprazole, pregabalin, rosuvastatin, senna, and spironolactone  History Review  Allergy: Ms. Koelzer has No Known Allergies. Drug: Ms. Angelo  reports no history of drug use. Alcohol:  reports current alcohol use of about 56.0  standard drinks of alcohol per week. Tobacco:  reports that she has been smoking cigarettes. She has a 22.50 pack-year smoking history. She has never used smokeless tobacco. Social: Ms. Broecker  reports that she has been smoking cigarettes. She has a 22.50 pack-year smoking history. She has never used smokeless tobacco. She reports current alcohol use of about 56.0 standard drinks of alcohol per week. She reports that she does not use drugs. Medical:  has a past medical history of Alcohol abuse, Alcoholic cirrhosis ([place]), Anxiety, Chronic hepatitis C ([place]), Chronic LBP (12/25/2014), Chronic pain syndrome, DDD (degenerative disc disease), lumbar, Diverticulosis, Emphysema lung ([place]), Fibromyalgia, Full dentures, GERD (gastroesophageal reflux disease), Hiatal hernia, History of GI bleed, Hypertension, Iron deficiency anemia due to chronic blood loss, Lumbar facet joint syndrome, Lumbar spondylosis, Lung cancer ([place]) (11/25/2016), MDD (major depressive disorder), Nephrolithiasis, OA (osteoarthritis of spine), Polyarthritis, Polyneuropathy, Restless leg syndrome, Right renal mass, RLS (restless legs syndrome), SOB (shortness of breath), Type 2 diabetes mellitus ([place]), Urinary frequency, and Vitamin D deficiency disease. Surgical: Ms. Pergola  has a past surgical history that includes Esophagogastroduodenoscopy (N/A, 04/14/2015); Esophagogastroduodenoscopy (egd) with propofol (N/A, 09/16/2015); Colonoscopy  with propofol (N/A, 09/25/2015); Umbilical hernia repair (N/A, 11/06/2015); Esophagogastroduodenoscopy (egd) with propofol (N/A, 01/22/2016); Colonoscopy with propofol (N/A, 04/01/2016); Esophagogastroduodenoscopy (egd) with propofol (N/A, 04/01/2016); Electormagnetic navigation bronchoscopy (N/A, 11/25/2016); IR Radiologist Eval & Mgmt (04/13/2017); IR Radiologist Eval & Mgmt (11/15/2017); Spinal fusion (12/19/2011); Endometrial ablation (2007); Tubal ligation (Bilateral, yrs ago); Tonsillectomy (age 40); Radiology with  anesthesia (Right, 12/14/2017); and Foot arthrodesis (Right, 04/06/2018). Family: family history includes [name] in her father; Breast cancer (age of onset: 28) in her sister; Heart disease in her mother; Stroke in her mother.  Laboratory Chemistry Profile   Renal Lab Results  Component Value Date   BUN 5 (L) 12/17/2019   CREATININE 0.46 12/17/2019   GFRAA >60 12/17/2019   GFRNONAA >60 12/17/2019     Hepatic Lab Results  Component Value Date   AST 63 (H) 12/17/2019   ALT 23 12/17/2019   ALBUMIN 2.5 (L) 12/17/2019   ALKPHOS 95 12/17/2019   LIPASE 46 12/16/2019   AMMONIA 55 (H) 12/16/2019     Electrolytes Lab Results  Component Value Date   NA 130 (L) 12/17/2019   K 4.6 12/17/2019   CL 98 12/17/2019   CALCIUM 7.8 (L) 12/17/2019   MG 1.9 01/22/2016   PHOS 3.6 11/23/2013     Bone No results found for: VD25OH, VD125OH2TOT, MW4132GM0, NU2725DG6, 25OHVITD1, 25OHVITD2, 25OHVITD3, TESTOFREE, TESTOSTERONE   Inflammation (CRP: Acute Phase) (ESR: Chronic Phase) Lab Results  Component Value Date   CRP <0.5 04/03/2015   ESRSEDRATE 27 04/03/2015   LATICACIDVEN 2.7 ([place]) 04/23/2017       Note: Above Lab results reviewed.  Recent Imaging Review  IR IMAGE GUIDED DRAINAGE BY PERCUTANEOUS CATHETER INDICATION: 59 year old with alcoholic cirrhosis and recurrent symptomatic ascites. Patient is under hospice care and request for tunneled peritoneal catheter.  EXAM: PLACEMENT OF TUNNELED PERITONEAL CATHETER WITH ULTRASOUND AND FLUOROSCOPIC GUIDANCE  MEDICATIONS: Ancef 2 g  ANESTHESIA/SEDATION: Versed 1 mg  The patient was continuously monitored during the procedure by the interventional radiology nurse under my direct supervision.  FLUOROSCOPY TIME:  6 seconds, 6 mGy  COMPLICATIONS: None immediate.  PROCEDURE: Informed written consent was obtained from the patient and family after a thorough discussion of the procedural risks, benefits and alternatives. All questions  were addressed. A timeout was performed prior to the initiation of the procedure.  Patient was placed supine on the interventional table. Ultrasound demonstrated a large amount of fluid in the right abdomen. Right side of the abdomen was prepped and draped in sterile fashion. Skin was anesthetized using 1% lidocaine. Two incisions were made on the right side of the abdomen. A PleurX catheter was tunneled between these incisions and the cuff was placed underneath the skin. Needle was directed into the abdomen while aspirating along the more lateral incision. Clear yellow fluid was aspirated. J wire was advanced into the peritoneal cavity. The tract was dilated to accommodate the peel-away sheath. The peritoneal PleurX catheter was advanced through the peel-away sheath and into the abdomen. PleurX catheter was attached to vacuum bottles and 7 L of yellow fluid was removed. Catheter exit site was secured to the skin using a Prolene suture. Peritoneal entrance skin site was closed using absorbable suture and Dermabond. Dressings were placed over the drain after fluid removal.  Fluoroscopic and ultrasound images were taken and saved for documentation.  IMPRESSION: Successful placement of a tunneled peritoneal drainage catheter using ultrasound and fluoroscopic guidance. 7 L of fluid was removed after drain placement.  Electronically Signed   By:  Markus Daft M.D.   On: 01/29/2020 13:05 Note: Reviewed        Physical Exam  General appearance: Well nourished, well developed, and well hydrated. In no apparent acute distress Mental status: Alert, oriented x 3 (person, place, & time)       Respiratory: No evidence of acute respiratory distress Eyes: PERLA Vitals: There were no vitals taken for this visit. BMI: Estimated body mass index is 25.83 kg/m as calculated from the following:   Height as of 01/29/20: $RemoveBef'5\' 10"'DcPWwRUFDo$  (1.778 m).   Weight as of 01/29/20: 180 lb (81.6 kg). Ideal: Patient  weight not recorded  Assessment   Status Diagnosis  Controlled Controlled Controlled 1. Chronic pain syndrome   2. Cancer-related pain   3. Chronic low back pain (1ry area of Pain) (Bilateral) (L>R)   4. Chronic lower extremity pain (2ry area of Pain) (Left)   5. Failed back surgical syndrome   6. Uncomplicated opioid dependence ([place])   7. Pharmacologic therapy   8. Chronic musculoskeletal pain      Updated Problems: No problems updated.  Plan of Care  Problem-specific:  No problem-specific Assessment & Plan notes found for this encounter.  Ms. RICKIA FREEBURG has a current medication list which includes the following long-term medication(s): linzess, oxycodone hcl, rosuvastatin, and spironolactone.  Pharmacotherapy (Medications Ordered): No orders of the defined types were placed in this encounter.  Orders:  No orders of the defined types were placed in this encounter.  Follow-up plan:   No follow-ups on file.      Interventional management options:  Considering:   NOTE: Hx of Thrombocytopenia  Diagnostic bilateral lumbar facetblock  Possible bilateral lumbar facet RFA Diagnostic left caudal ESI + diagnostic epidurogram Possible Racz procedure Diagnostic bilateral SIjoint injection  Possible bilateral SI joint RFA Diagnostic left CESI  Diagnostic bilateral cervical facetblock  Possible bilateral cervical facet RFA   Palliative PRN treatment(s):   Diagnostic bilateral lumbar facet block Diagnostic bilateral IA hipjoint injection Diagnostic bilateral SIjoint block      Recent Visits Date Type Provider Dept  12/05/19 Office Visit [name], MD [facility]  Showing recent visits within past 90 days and meeting all other requirements Future Appointments Date Type Provider Dept  12-Mar-2020 Appointment [name], MD [facility]  Showing future appointments within next 90 days and meeting all other requirements  I  discussed the assessment and treatment plan with the patient. The patient was provided an opportunity to ask questions and all were answered. The patient agreed with the plan and demonstrated an understanding of the instructions.  Patient advised to call back or seek an in-person evaluation if the symptoms or condition worsens.  Duration of encounter: *** minutes.  Note by: Gaspar Cola, MD Date: 03/12/20; Time: 1:16 PM

## 2020-02-27 DEATH — deceased

## 2021-11-02 IMAGING — CR DG ABDOMEN ACUTE W/ 1V CHEST
5 series · 5 of 5 positions shown · non-contrast
Comparison: None.

CLINICAL DATA: Abdominal pain and shortness of breath

EXAM:
X-RAY ABDOMEN acute with one-view chest

[chest pa]
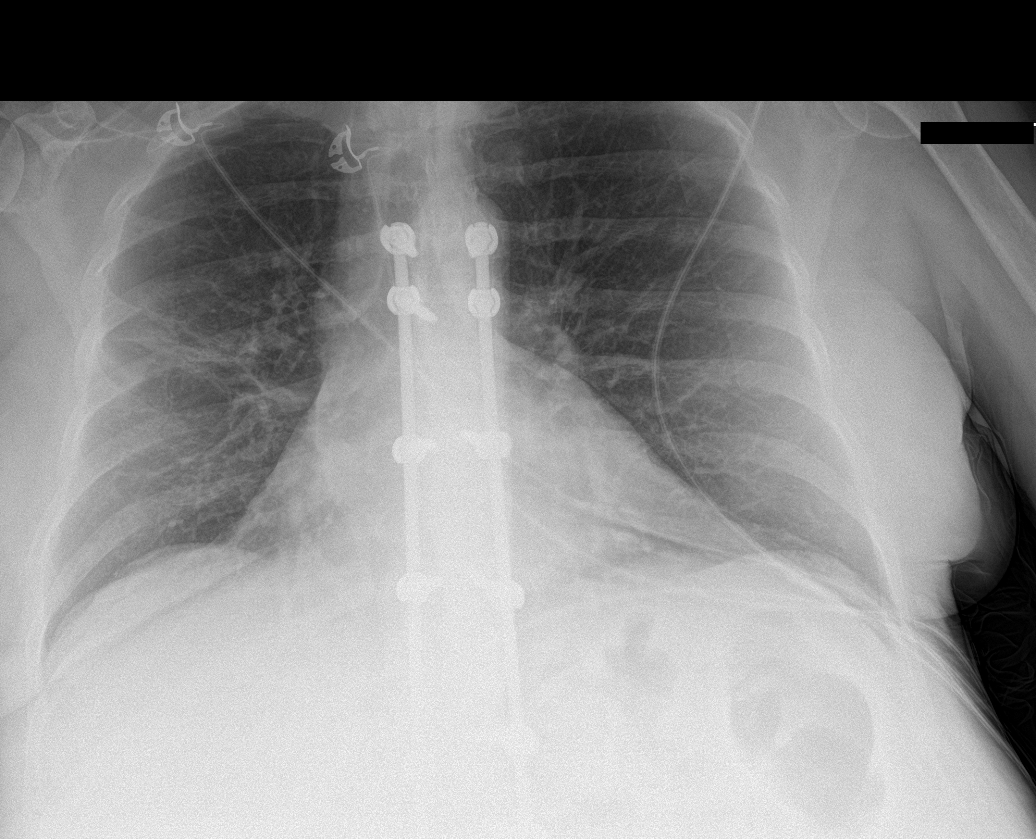

[abdomen erect (1 of 2)]
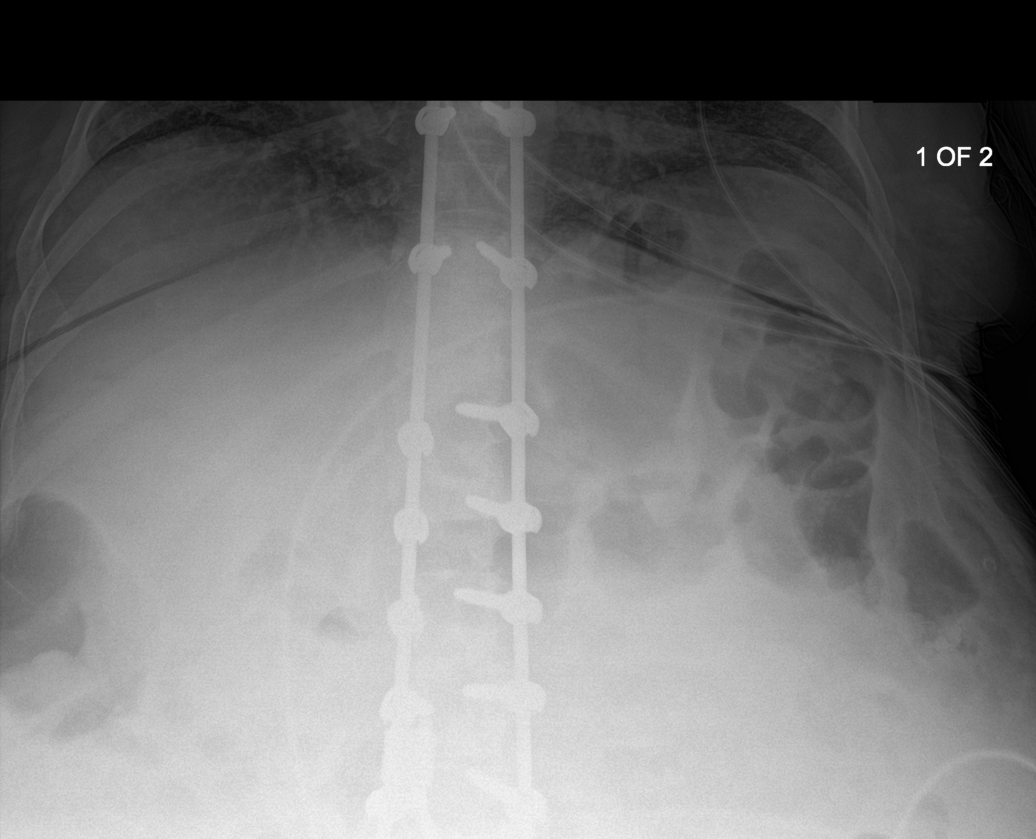

[abdomen kub (1 of 2)]
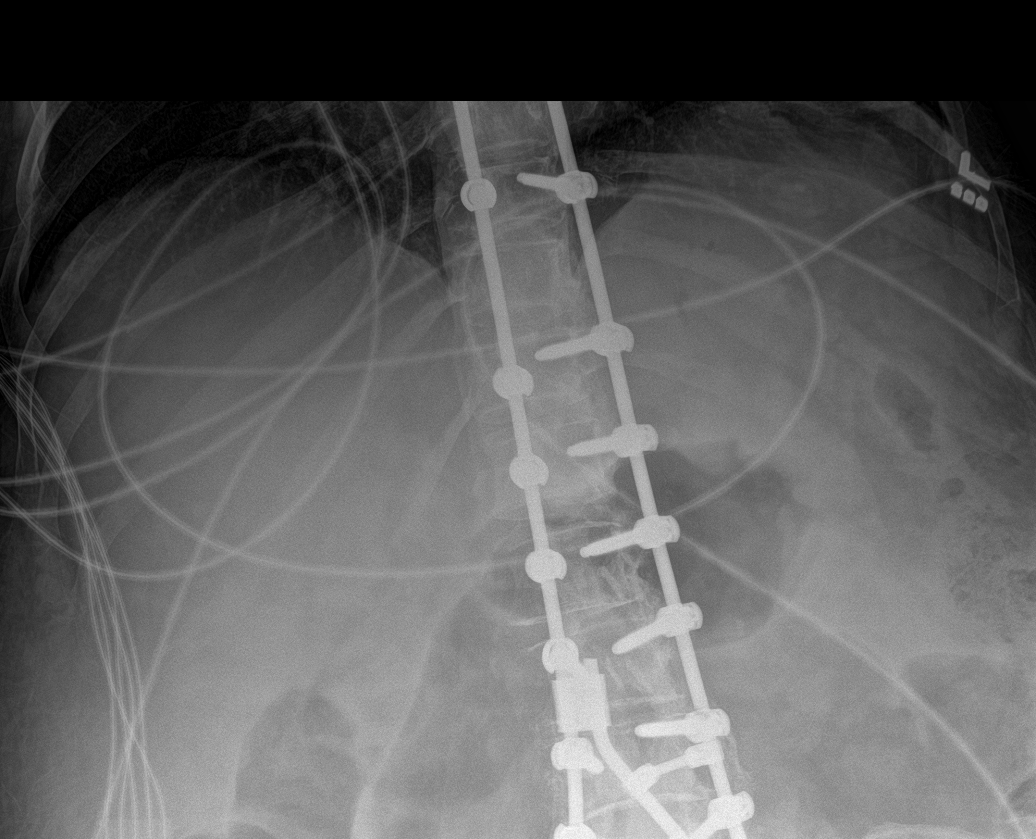

[abdomen kub (2 of 2)]
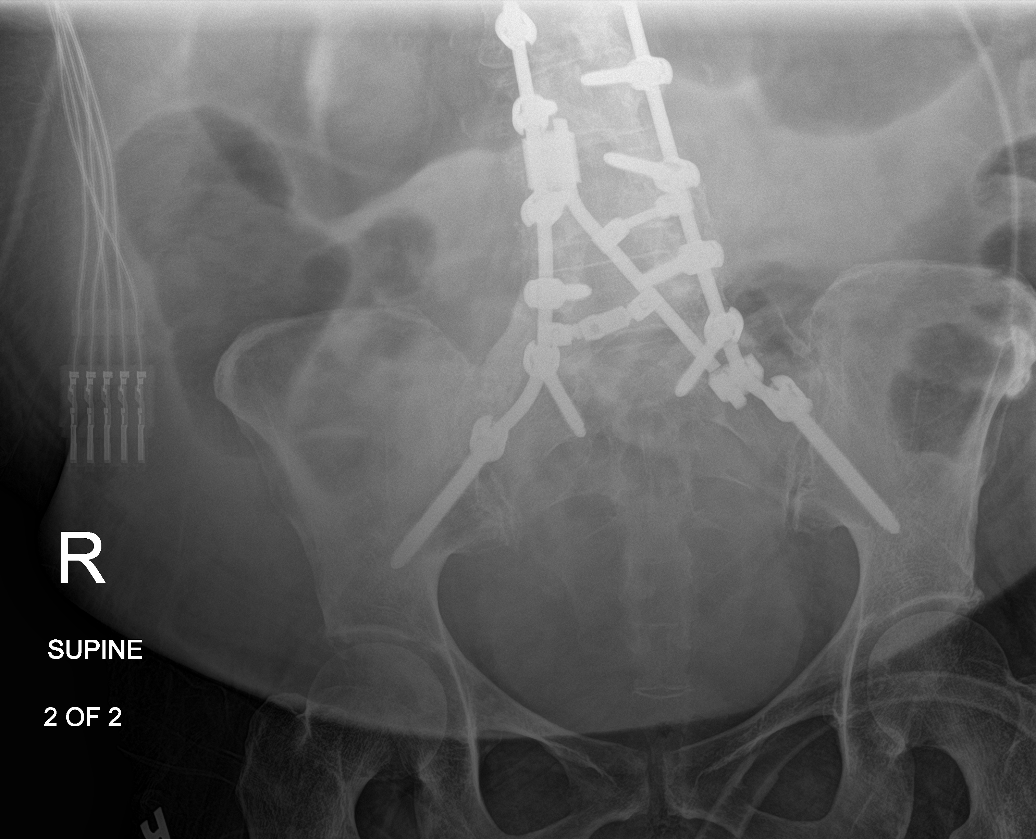

[abdomen erect (2 of 2)]
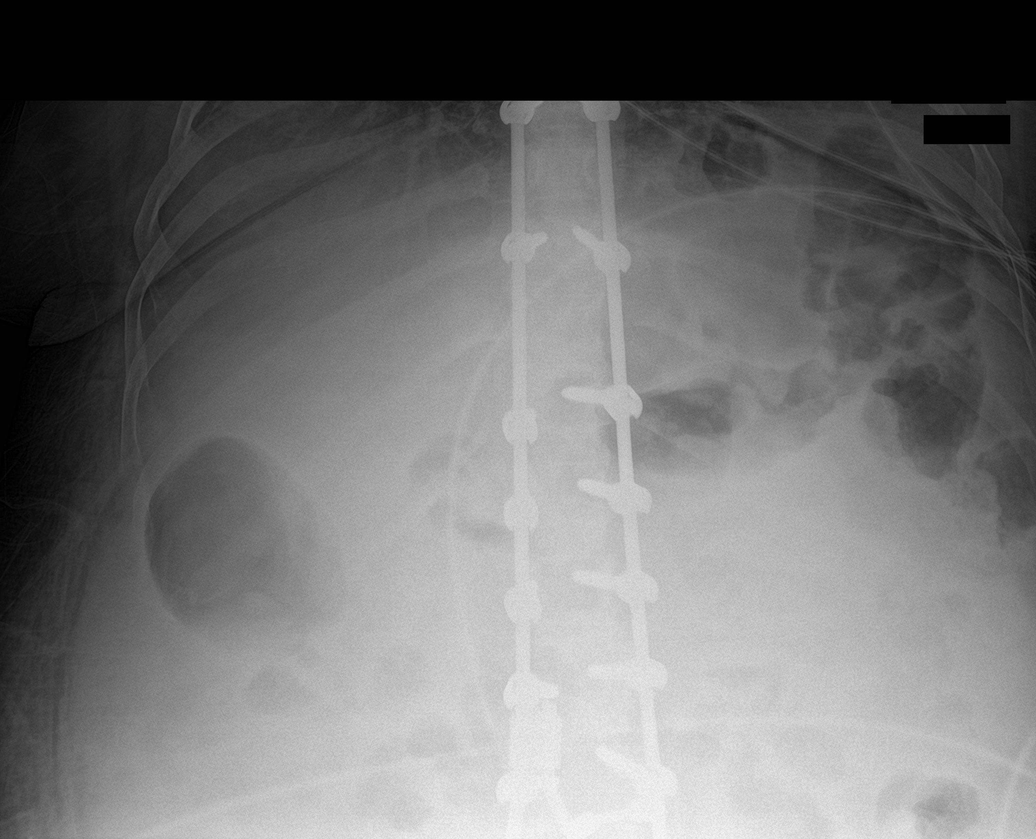

[5 of 5 positions shown; findings below may reference images not displayed]

FINDINGS: There is no evidence of dilated bowel loops or free intraperitoneal
air. No radiopaque calculi or other significant radiographic
abnormality is seen. Heart size and mediastinal contours are within
normal limits. Both lungs are clear aside from right midlung
atelectasis.
IMPRESSION: Negative.
